# Patient Record
Sex: Male | Born: 1955 | Race: White | Hispanic: No | Smoking: Current every day smoker
Health system: Southern US, Community
[De-identification: ages and names within clinical notes are randomized; demographics above are authoritative.]

## PROBLEM LIST (undated history)

## (undated) DIAGNOSIS — I255 Ischemic cardiomyopathy: Secondary | ICD-10-CM

## (undated) DIAGNOSIS — D72829 Elevated white blood cell count, unspecified: Secondary | ICD-10-CM

## (undated) DIAGNOSIS — M1990 Unspecified osteoarthritis, unspecified site: Secondary | ICD-10-CM

## (undated) DIAGNOSIS — I5022 Chronic systolic (congestive) heart failure: Secondary | ICD-10-CM

## (undated) DIAGNOSIS — G8929 Other chronic pain: Secondary | ICD-10-CM

## (undated) DIAGNOSIS — E785 Hyperlipidemia, unspecified: Secondary | ICD-10-CM

## (undated) DIAGNOSIS — I779 Disorder of arteries and arterioles, unspecified: Secondary | ICD-10-CM

## (undated) DIAGNOSIS — I219 Acute myocardial infarction, unspecified: Secondary | ICD-10-CM

## (undated) DIAGNOSIS — M79671 Pain in right foot: Secondary | ICD-10-CM

## (undated) DIAGNOSIS — I2510 Atherosclerotic heart disease of native coronary artery without angina pectoris: Secondary | ICD-10-CM

## (undated) DIAGNOSIS — J449 Chronic obstructive pulmonary disease, unspecified: Secondary | ICD-10-CM

## (undated) DIAGNOSIS — R55 Syncope and collapse: Secondary | ICD-10-CM

## (undated) DIAGNOSIS — I10 Essential (primary) hypertension: Secondary | ICD-10-CM

## (undated) DIAGNOSIS — J439 Emphysema, unspecified: Secondary | ICD-10-CM

## (undated) DIAGNOSIS — Z87442 Personal history of urinary calculi: Secondary | ICD-10-CM

## (undated) DIAGNOSIS — G4733 Obstructive sleep apnea (adult) (pediatric): Secondary | ICD-10-CM

## (undated) DIAGNOSIS — R054 Cough syncope: Secondary | ICD-10-CM

## (undated) DIAGNOSIS — Z8489 Family history of other specified conditions: Secondary | ICD-10-CM

## (undated) DIAGNOSIS — N200 Calculus of kidney: Secondary | ICD-10-CM

## (undated) DIAGNOSIS — Z9989 Dependence on other enabling machines and devices: Secondary | ICD-10-CM

## (undated) HISTORY — DX: Obstructive sleep apnea (adult) (pediatric): G47.33

## (undated) HISTORY — PX: BACK SURGERY: SHX140

## (undated) HISTORY — DX: Hyperlipidemia, unspecified: E78.5

## (undated) HISTORY — PX: LUMBAR LAMINECTOMY: SHX95

## (undated) HISTORY — DX: Disorder of arteries and arterioles, unspecified: I77.9

## (undated) HISTORY — DX: Elevated white blood cell count, unspecified: D72.829

## (undated) HISTORY — DX: Chronic systolic (congestive) heart failure: I50.22

## (undated) HISTORY — DX: Dependence on other enabling machines and devices: Z99.89

## (undated) HISTORY — DX: Ischemic cardiomyopathy: I25.5

## (undated) HISTORY — DX: Unspecified osteoarthritis, unspecified site: M19.90

## (undated) HISTORY — DX: Emphysema, unspecified: J43.9

## (undated) HISTORY — DX: Calculus of kidney: N20.0

## (undated) HISTORY — PX: LITHOTRIPSY: SUR834

## (undated) HISTORY — PX: CERVICAL FUSION: SHX112

## (undated) HISTORY — DX: Chronic obstructive pulmonary disease, unspecified: J44.9

## (undated) HISTORY — DX: Atherosclerotic heart disease of native coronary artery without angina pectoris: I25.10

## (undated) HISTORY — DX: Acute myocardial infarction, unspecified: I21.9

## (undated) HISTORY — PX: COLON SURGERY: SHX602

## (undated) HISTORY — DX: Pain in right foot: M79.671

## (undated) HISTORY — DX: Other chronic pain: G89.29

---
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
## 1988-01-05 HISTORY — PX: PARTIAL COLECTOMY: SHX5273

## 1999-01-04 ENCOUNTER — Emergency Department (HOSPITAL_COMMUNITY): Admission: EM | Admit: 1999-01-04 | Discharge: 1999-01-04 | Payer: Self-pay | Admitting: *Deleted

## 1999-12-11 ENCOUNTER — Emergency Department (HOSPITAL_COMMUNITY): Admission: EM | Admit: 1999-12-11 | Discharge: 1999-12-11 | Payer: Self-pay | Admitting: Internal Medicine

## 1999-12-17 ENCOUNTER — Emergency Department (HOSPITAL_COMMUNITY): Admission: EM | Admit: 1999-12-17 | Discharge: 1999-12-17 | Payer: Self-pay | Admitting: Emergency Medicine

## 2000-02-01 ENCOUNTER — Ambulatory Visit (HOSPITAL_COMMUNITY): Admission: RE | Admit: 2000-02-01 | Discharge: 2000-02-01 | Payer: Self-pay | Admitting: Neurosurgery

## 2000-02-01 ENCOUNTER — Encounter: Payer: Self-pay | Admitting: Neurosurgery

## 2000-03-14 ENCOUNTER — Ambulatory Visit (HOSPITAL_COMMUNITY): Admission: RE | Admit: 2000-03-14 | Discharge: 2000-03-14 | Payer: Self-pay | Admitting: Neurosurgery

## 2000-03-14 ENCOUNTER — Encounter: Payer: Self-pay | Admitting: Neurosurgery

## 2002-09-17 ENCOUNTER — Encounter: Payer: Self-pay | Admitting: Emergency Medicine

## 2002-09-17 ENCOUNTER — Emergency Department (HOSPITAL_COMMUNITY): Admission: EM | Admit: 2002-09-17 | Discharge: 2002-09-17 | Payer: Self-pay | Admitting: Emergency Medicine

## 2013-01-04 DIAGNOSIS — G8929 Other chronic pain: Secondary | ICD-10-CM

## 2013-01-04 DIAGNOSIS — M79671 Pain in right foot: Secondary | ICD-10-CM

## 2013-01-04 DIAGNOSIS — I219 Acute myocardial infarction, unspecified: Secondary | ICD-10-CM | POA: Insufficient documentation

## 2013-01-04 HISTORY — PX: CARDIAC CATHETERIZATION: SHX172

## 2013-01-04 HISTORY — PX: LIVER SURGERY: SHX698

## 2013-01-04 HISTORY — DX: Acute myocardial infarction, unspecified: I21.9

## 2013-01-04 HISTORY — DX: Other chronic pain: G89.29

## 2016-10-10 ENCOUNTER — Emergency Department: Payer: Medicaid Other

## 2016-10-10 ENCOUNTER — Emergency Department
Admission: EM | Admit: 2016-10-10 | Discharge: 2016-10-10 | Disposition: A | Discharge status: Home / Self Care | Payer: Medicaid Other | Attending: Emergency Medicine | Admitting: Emergency Medicine

## 2016-10-10 ENCOUNTER — Encounter: Payer: Self-pay | Admitting: Emergency Medicine

## 2016-10-10 DIAGNOSIS — Z79899 Other long term (current) drug therapy: Secondary | ICD-10-CM | POA: Insufficient documentation

## 2016-10-10 DIAGNOSIS — R0789 Other chest pain: Secondary | ICD-10-CM

## 2016-10-10 DIAGNOSIS — Z7982 Long term (current) use of aspirin: Secondary | ICD-10-CM | POA: Insufficient documentation

## 2016-10-10 DIAGNOSIS — I10 Essential (primary) hypertension: Secondary | ICD-10-CM | POA: Insufficient documentation

## 2016-10-10 DIAGNOSIS — F17210 Nicotine dependence, cigarettes, uncomplicated: Secondary | ICD-10-CM | POA: Diagnosis not present

## 2016-10-10 HISTORY — DX: Essential (primary) hypertension: I10

## 2016-10-10 LAB — CBC
HCT: 47.3 % (ref 40.0–52.0)
Hemoglobin: 16 g/dL (ref 13.0–18.0)
MCH: 29.6 pg (ref 26.0–34.0)
MCHC: 33.8 g/dL (ref 32.0–36.0)
MCV: 87.7 fL (ref 80.0–100.0)
Platelets: 257 10*3/uL (ref 150–440)
RBC: 5.39 MIL/uL (ref 4.40–5.90)
RDW: 13.5 % (ref 11.5–14.5)
WBC: 13.1 10*3/uL — ABNORMAL HIGH (ref 3.8–10.6)

## 2016-10-10 LAB — BASIC METABOLIC PANEL
Anion gap: 9 (ref 5–15)
BUN: 19 mg/dL (ref 6–20)
CO2: 27 mmol/L (ref 22–32)
Calcium: 9.4 mg/dL (ref 8.9–10.3)
Chloride: 103 mmol/L (ref 101–111)
Creatinine, Ser: 1.15 mg/dL (ref 0.61–1.24)
GFR calc Af Amer: >60 mL/min (ref >60)
GFR calc non Af Amer: >60 mL/min (ref >60)
Glucose, Bld: 129 mg/dL — ABNORMAL HIGH (ref 65–99)
Potassium: 3.6 mmol/L (ref 3.5–5.1)
Sodium: 139 mmol/L (ref 135–145)

## 2016-10-10 LAB — TROPONIN I
Troponin I: <0.03 ng/mL (ref <0.03)
Troponin I: <0.03 ng/mL (ref <0.03)

## 2016-10-10 MED ORDER — ASPIRIN 81 MG PO CHEW
324.0000 mg | CHEWABLE_TABLET | Freq: Once | ORAL | Status: AC
  Administered 2016-10-10: 324 mg via ORAL
  Filled 2016-10-10: qty 4

## 2016-10-10 NOTE — ED Triage Notes (Signed)
Pt comes into the ED via POV c/o chest pain on the left side with no radiation.  Patient has h/o MI and cardiac catheterizations in the past.  States this pain does not feel the same but that it scares him due to it being intermittent and sharp.  Patient ambulatory to triage and has even and unlabored respirations at this time.  Denies taking any nitro at home but took his 81 mg aspirin.

## 2016-10-10 NOTE — ED Notes (Signed)
Pt states cp weds and then again today. Hx of 2 stents. Moved here one month ago from [place] and does not have a cardiologist or a pcp. Asa given, pt on monitor. Redraw of troponin due at 2015.

## 2016-10-10 NOTE — ED Provider Notes (Signed)
[facility] Emergency Department Provider Note  ____________________________________________  Time seen: Approximately 9:32 PM  I have reviewed the triage vital signs and the nursing notes.   HISTORY  Chief Complaint Chest Pain    HPI Caleb [name] is a 61 y.o. male who complains of intermittent chest pain for the past 4 days. Not exertional, not pleuritic. In the left chest just at the areola of the left pectoralis. Tender to the touch in that area. Sharp, comes and goes lasting 2 seconds at a time. No diaphoresis vomiting or radiation. No shortness of breath. Mild to moderate intensity.     Past Medical History:  Diagnosis Date  . Hypertension   . MI (mitral incompetence)      There are no active problems to display for this patient.    Past Surgical History:  Procedure Laterality Date  . CARDIAC CATHETERIZATION    . CERVICAL FUSION    . COLECTOMY    . HERNIA REPAIR    . LIVER SURGERY    . LUMBAR LAMINECTOMY       Prior to Admission medications   Medication Sig Start Date End Date Taking? Authorizing Provider  albuterol (PROVENTIL HFA;VENTOLIN HFA) 108 (90 Base) MCG/ACT inhaler Inhale 1-2 puffs into the lungs every 6 (six) hours as needed for wheezing or shortness of breath.   Yes [provider]  aspirin EC 81 MG tablet Take 81 mg by mouth daily.   Yes [provider]  cyclobenzaprine (FLEXERIL) 10 MG tablet Take 10 mg by mouth daily.   Yes [provider]  gabapentin (NEURONTIN) 300 MG capsule Take 600 mg by mouth 4 (four) times daily.   Yes [provider]  hydrochlorothiazide (HYDRODIURIL) 25 MG tablet Take 25 mg by mouth daily.   Yes [provider]  isosorbide mononitrate (IMDUR) 30 MG 24 hr tablet Take 15 mg by mouth daily.   Yes [provider]  metoprolol tartrate (LOPRESSOR) 25 MG tablet Take 25 mg by mouth 2 (two) times daily.   Yes [provider]  rosuvastatin  (CRESTOR) 40 MG tablet Take 40 mg by mouth daily.   Yes [provider]  tiotropium (SPIRIVA) 18 MCG inhalation capsule Place 18 mcg into inhaler and inhale daily.   Yes [provider]  fluticasone (FLOVENT HFA) 220 MCG/ACT inhaler Inhale 1 puff into the lungs 2 (two) times daily.    [provider]     Allergies Patient has no known allergies.   No family history on file.  Social History Social History  Substance Use Topics  . Smoking status: Current Every Day Smoker    Packs/day: 1.00    Types: Cigarettes  . Smokeless tobacco: Never Used  . Alcohol use Yes    Review of Systems  Constitutional:   No fever or chills.  ENT:   No sore throat. No rhinorrhea. Cardiovascular:   positive as above for chest pain syncope. Respiratory:   No dyspnea or cough. Gastrointestinal:   Negative for abdominal pain, vomiting and diarrhea.  Musculoskeletal:   Negative for focal pain or swelling All other systems reviewed and are negative except as documented above in ROS and HPI.  ____________________________________________   PHYSICAL EXAM:  VITAL SIGNS: ED Triage Vitals  Enc Vitals Group     BP 10/10/16 1847 (!) 169/92     Pulse Rate 10/10/16 1900 82     Resp 10/10/16 1900 19     Temp --  Temp src --      SpO2 10/10/16 1900 98 %     Weight 10/10/16 1717 195 lb (88.5 kg)     Height 10/10/16 1717 5\' 8"  (1.727 m)     Head Circumference --      Peak Flow --      Pain Score 10/10/16 1716 7     Pain Loc --      Pain Edu? --      Excl. in [place]? --     Vital signs reviewed, nursing assessments reviewed.   Constitutional:   Alert and oriented. Well appearing and in no distress. Eyes:   No scleral icterus.  EOMI. No nystagmus. No conjunctival pallor. PERRL. ENT   Head:   Normocephalic and atraumatic.   Nose:   No congestion/rhinnorhea.    Mouth/Throat:   MMM, no pharyngeal erythema. No peritonsillar mass.    Neck:   No meningismus. Full  ROM. Hematological/Lymphatic/Immunilogical:   No cervical lymphadenopathy. Cardiovascular:   RRR. Symmetric bilateral radial and DP pulses.  No murmurs.  Respiratory:   Normal respiratory effort without tachypnea/retractions. Breath sounds are clear and equal bilaterally. No wheezes/rales/rhonchi. Gastrointestinal:   Soft with mild left upper quadrant tenderness. Non distended. There is no CVA tenderness.  No rebound, rigidity, or guarding. Genitourinary:   deferred Musculoskeletal:   Normal range of motion in all extremities. No joint effusions.  No lower extremity tenderness.  No edema.left chest wall tender at the areola without inflammatory changes or discharge. No gynecomastia Neurologic:   Normal speech and language.  Motor grossly intact. No gross focal neurologic deficits are appreciated.  Skin:    Skin is warm, dry and intact. No rash noted.  No petechiae, purpura, or bullae.  ____________________________________________    LABS (pertinent positives/negatives) (all labs ordered are listed, but only abnormal results are displayed) Labs Reviewed  BASIC METABOLIC PANEL - Abnormal; Notable for the following:       Result Value   Glucose, Bld 129 (*)    All other components within normal limits  CBC - Abnormal; Notable for the following:    WBC 13.1 (*)    All other components within normal limits  TROPONIN I  TROPONIN I   ____________________________________________   EKG  interpreted by me Sinus rhythm rate of 93, left axis, normal intervals. Normal QRS ST segments and T waves. 2 PVCs on the strip.  ____________________________________________    RADIOLOGY  Dg Chest 2 View  Result Date: 10/10/2016 CLINICAL DATA:  Chest pain since this morning. EXAM: CHEST  2 VIEW COMPARISON:  CXR report 09/17/2002 FINDINGS: The heart size and mediastinal contours are within normal limits. No pulmonary consolidation, effusion or pneumothorax. Mild central vascular congestion.  Degenerative changes are seen along the dorsal spine. IMPRESSION: 1. Mild central vascular prominence/congestion. 2. Mild thoracic spondylosis. Electronically Signed   By: Ashley Royalty M.D.   On: 10/10/2016 17:38    ____________________________________________   PROCEDURES Procedures  ____________________________________________   INITIAL IMPRESSION / ASSESSMENT AND PLAN / ED COURSE  Pertinent labs & imaging results that were available during my care of the patient were reviewed by me and considered in my medical decision making (see chart for details).  As part of my medical decision making, I reviewed the following data within the [place] lab results, radiology reports and I personally reviewed the following imaging studies: radiograph(s).   patient presents with atypical chest pain. Possible pneumonia or pneumothorax. Symptoms may be related to gastritis  and GERD. .Considering the patient's symptoms, medical history, and physical examination today, I have low suspicion for ACS, PE, TAD, carditis, mediastinitis, CHF, or sepsis.We'll get a chest x-ray and delta troponin to evaluate given his underlying comorbidities.    ----------------------------------------- 9:33 PM on 10/10/2016 -----------------------------------------  workup negative. Repeat troponin negative. Patient calm comfortable, symptoms minimal, suitable for outpatient follow-up.        ____________________________________________   FINAL CLINICAL IMPRESSION(S) / ED DIAGNOSES  Final diagnoses:  Atypical chest pain      New Prescriptions   No medications on file     Portions of this note were generated with dragon dictation software. Dictation errors may occur despite best attempts at proofreading.    Carrie Mew, MD 10/10/16 2135

## 2016-10-10 NOTE — ED Notes (Signed)
Pt walked out stating he was tired of waiting. Dr made aware.

## 2016-10-21 ENCOUNTER — Ambulatory Visit (INDEPENDENT_AMBULATORY_CARE_PROVIDER_SITE_OTHER): Payer: Medicaid Other | Admitting: Family Medicine

## 2016-10-21 ENCOUNTER — Ambulatory Visit
Admission: RE | Admit: 2016-10-21 | Discharge: 2016-10-21 | Disposition: A | Discharge status: Home / Self Care | Payer: Medicaid Other | Source: Ambulatory Visit | Attending: Family Medicine | Admitting: Family Medicine

## 2016-10-21 ENCOUNTER — Encounter: Payer: Self-pay | Admitting: Family Medicine

## 2016-10-21 VITALS — BP 144/80 | HR 82 | Temp 98.0°F | Resp 20 | Ht 68.0 in | Wt 200.0 lb

## 2016-10-21 DIAGNOSIS — M79671 Pain in right foot: Secondary | ICD-10-CM

## 2016-10-21 DIAGNOSIS — E785 Hyperlipidemia, unspecified: Secondary | ICD-10-CM | POA: Insufficient documentation

## 2016-10-21 DIAGNOSIS — J449 Chronic obstructive pulmonary disease, unspecified: Secondary | ICD-10-CM

## 2016-10-21 DIAGNOSIS — R079 Chest pain, unspecified: Secondary | ICD-10-CM | POA: Diagnosis not present

## 2016-10-21 DIAGNOSIS — R109 Unspecified abdominal pain: Secondary | ICD-10-CM | POA: Diagnosis not present

## 2016-10-21 DIAGNOSIS — Z7689 Persons encountering health services in other specified circumstances: Secondary | ICD-10-CM

## 2016-10-21 DIAGNOSIS — G4733 Obstructive sleep apnea (adult) (pediatric): Secondary | ICD-10-CM | POA: Diagnosis not present

## 2016-10-21 DIAGNOSIS — Z9989 Dependence on other enabling machines and devices: Secondary | ICD-10-CM

## 2016-10-21 DIAGNOSIS — I219 Acute myocardial infarction, unspecified: Secondary | ICD-10-CM

## 2016-10-21 DIAGNOSIS — I10 Essential (primary) hypertension: Secondary | ICD-10-CM | POA: Insufficient documentation

## 2016-10-21 DIAGNOSIS — R935 Abnormal findings on diagnostic imaging of other abdominal regions, including retroperitoneum: Secondary | ICD-10-CM | POA: Insufficient documentation

## 2016-10-21 DIAGNOSIS — G8929 Other chronic pain: Secondary | ICD-10-CM

## 2016-10-21 LAB — POCT URINALYSIS DIPSTICK
Bilirubin, UA: NEGATIVE
Glucose, UA: NEGATIVE
Ketones, UA: NEGATIVE
Leukocytes, UA: NEGATIVE
Nitrite, UA: NEGATIVE
Spec Grav, UA: 1.015 (ref 1.010–1.025)
Urobilinogen, UA: 0.2 E.U./dL
pH, UA: 7 (ref 5.0–8.0)

## 2016-10-21 LAB — CBC WITH DIFFERENTIAL/PLATELET
Basophils Absolute: 36 cells/uL (ref 0–200)
Basophils Relative: 0.3 %
Eosinophils Absolute: 190 cells/uL (ref 15–500)
Eosinophils Relative: 1.6 %
HCT: 47.5 % (ref 38.5–50.0)
Hemoglobin: 15.9 g/dL (ref 13.2–17.1)
Lymphs Abs: 3296 cells/uL (ref 850–3900)
MCH: 29.1 pg (ref 27.0–33.0)
MCHC: 33.5 g/dL (ref 32.0–36.0)
MCV: 87 fL (ref 80.0–100.0)
MPV: 9.1 fL (ref 7.5–12.5)
Monocytes Relative: 8.4 %
Neutro Abs: 7378 cells/uL (ref 1500–7800)
Neutrophils Relative %: 62 %
Platelets: 263 10*3/uL (ref 140–400)
RBC: 5.46 10*6/uL (ref 4.20–5.80)
RDW: 12.4 % (ref 11.0–15.0)
Total Lymphocyte: 27.7 %
WBC mixed population: 1000 cells/uL — ABNORMAL HIGH (ref 200–950)
WBC: 11.9 10*3/uL — ABNORMAL HIGH (ref 3.8–10.8)

## 2016-10-21 LAB — COMPLETE METABOLIC PANEL WITH GFR
AG Ratio: 1.5 (calc) (ref 1.0–2.5)
ALT: 13 U/L (ref 9–46)
AST: 14 U/L (ref 10–35)
Albumin: 4.1 g/dL (ref 3.6–5.1)
Alkaline phosphatase (APISO): 75 U/L (ref 40–115)
BUN: 20 mg/dL (ref 7–25)
CO2: 31 mmol/L (ref 20–32)
Calcium: 9.4 mg/dL (ref 8.6–10.3)
Chloride: 98 mmol/L (ref 98–110)
Creat: 0.98 mg/dL (ref 0.70–1.33)
GFR, Est African American: 98 mL/min/{1.73_m2} (ref >=60)
GFR, Est Non African American: 85 mL/min/{1.73_m2} (ref >=60)
Globulin: 2.8 g/dL (calc) (ref 1.9–3.7)
Glucose, Bld: 116 mg/dL — ABNORMAL HIGH (ref 65–99)
Potassium: 3.4 mmol/L — ABNORMAL LOW (ref 3.5–5.3)
Sodium: 137 mmol/L (ref 135–146)
Total Bilirubin: 0.3 mg/dL (ref 0.2–1.2)
Total Protein: 6.9 g/dL (ref 6.1–8.1)

## 2016-10-21 LAB — LIPID PANEL
Cholesterol: 140 mg/dL (ref <200)
HDL: 39 mg/dL — ABNORMAL LOW (ref >40)
LDL Cholesterol (Calc): 68 mg/dL (calc)
Non-HDL Cholesterol (Calc): 101 mg/dL (calc) (ref <130)
Total CHOL/HDL Ratio: 3.6 (calc) (ref <5.0)
Triglycerides: 248 mg/dL — ABNORMAL HIGH (ref <150)

## 2016-10-21 MED ORDER — HYDROCHLOROTHIAZIDE 25 MG PO TABS: 25.0000 mg | ORAL_TABLET | Freq: Every day | ORAL | 3 refills | Status: DC

## 2016-10-21 MED ORDER — ROSUVASTATIN CALCIUM 40 MG PO TABS: 40.0000 mg | ORAL_TABLET | Freq: Every day | ORAL | 3 refills | Status: DC

## 2016-10-21 MED ORDER — CYCLOBENZAPRINE HCL 10 MG PO TABS: 10.0000 mg | ORAL_TABLET | Freq: Every day | ORAL | 1 refills | Status: DC

## 2016-10-21 MED ORDER — GABAPENTIN 300 MG PO CAPS: 600.0000 mg | ORAL_CAPSULE | Freq: Four times a day (QID) | ORAL | 2 refills | Status: DC

## 2016-10-21 NOTE — Assessment & Plan Note (Signed)
History and exam consistent with GERD Given reproducibility on exam, improvement with Zantac, worsening after eating a burger, recent negative troponins and chest x-ray, unchanged EKG and being nonexertional in quality, very low suspicion for cardiac etiology Trial of Zantac twice a day Return precautions discussed

## 2016-10-21 NOTE — Assessment & Plan Note (Signed)
Uncontrolled currently Patient states this is due to being nervous and having pain today Continue HCTZ Check CMP today Follow-up in one month

## 2016-10-21 NOTE — Assessment & Plan Note (Signed)
Continue gabapentin and Flexeril as needed
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
## 2016-10-21 NOTE — Assessment & Plan Note (Signed)
Recheck lipid panel Continue Crestor

## 2016-10-21 NOTE — Assessment & Plan Note (Signed)
Patient is not currently in distress and sitting comfortably He has no findings on exam suggestive of nephrolithiasis His urinalysis is clear Suggested hydration, Zantac for GERD as above Get abdominal 2 view Return precautions discussed If he is symptomatic and it is suggestive of nephrolithiasis, consider CT abdomen and pelvis renal study given that he has needed lithotripsy times in the past
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
## 2016-10-21 NOTE — Assessment & Plan Note (Signed)
Currently stable Continues for spiriva, ICS, albuterol as needed

## 2016-10-21 NOTE — Assessment & Plan Note (Signed)
Continue medical management with beta blocker, statin, Imdur, aspirin Referral to cardiology for follow-up

## 2016-10-21 NOTE — Patient Instructions (Signed)
Heartburn Heartburn is a type of pain or discomfort that can happen in the throat or chest. It is often described as a burning pain. It may also cause a bad taste in the mouth. Heartburn may feel worse when you lie down or bend over, and it is often worse at night. Heartburn may be caused by stomach contents that move back up into the esophagus (reflux). Follow these instructions at home: Take these actions to decrease your discomfort and to help avoid complications. Diet  Follow a diet as recommended by your health care provider. This may involve avoiding foods and drinks such as: ? Coffee and tea (with or without caffeine). ? Drinks that contain alcohol. ? Energy drinks and sports drinks. ? Carbonated drinks or sodas. ? Chocolate and cocoa. ? Peppermint and mint flavorings. ? Garlic and onions. ? Horseradish. ? Spicy and acidic foods, including peppers, chili powder, curry powder, vinegar, hot sauces, and barbecue sauce. ? Citrus fruit juices and citrus fruits, such as oranges, lemons, and limes. ? Tomato-based foods, such as red sauce, chili, salsa, and pizza with red sauce. ? Fried and fatty foods, such as donuts, french fries, potato chips, and high-fat dressings. ? High-fat meats, such as hot dogs and fatty cuts of red and white meats, such as rib eye steak, sausage, ham, and bacon. ? High-fat dairy items, such as whole milk, butter, and cream cheese.  Eat small, frequent meals instead of large meals.  Avoid drinking large amounts of liquid with your meals.  Avoid eating meals during the 2-3 hours before bedtime.  Avoid lying down right after you eat.  Do not exercise right after you eat. General instructions  Pay attention to any changes in your symptoms.  Take over-the-counter and prescription medicines only as told by your health care provider. Do not take aspirin, ibuprofen, or other NSAIDs unless your health care provider told you to do so.  Do not use any tobacco  products, including cigarettes, chewing tobacco, and e-cigarettes. If you need help quitting, ask your health care provider.  Wear loose-fitting clothing. Do not wear anything tight around your waist that causes pressure on your abdomen.  Raise (elevate) the head of your bed about 6 inches (15 cm).  Try to reduce your stress, such as with yoga or meditation. If you need help reducing stress, ask your health care provider.  If you are overweight, reduce your weight to an amount that is healthy for you. Ask your health care provider for guidance about a safe weight loss goal.  Keep all follow-up visits as told by your health care provider. This is important. Contact a health care provider if:  You have new symptoms.  You have unexplained weight loss.  You have difficulty swallowing, or it hurts to swallow.  You have wheezing or a persistent cough.  Your symptoms do not improve with treatment.  You have frequent heartburn for more than two weeks. Get help right away if:  You have pain in your arms, neck, jaw, teeth, or back.  You feel sweaty, dizzy, or light-headed.  You have chest pain or shortness of breath.  You vomit and your vomit looks like blood or coffee grounds.  Your stool is bloody or black. This information is not intended to replace advice given to you by your health care provider. Make sure you discuss any questions you have with your health care provider. Document Released: 05/09/2008 Document Revised: 05/29/2015 Document Reviewed: 04/17/2014 Elsevier Interactive Patient Education  2017 Elsevier   Inc.  

## 2016-10-25 ENCOUNTER — Encounter: Payer: Self-pay | Admitting: Family Medicine

## 2016-10-25 ENCOUNTER — Ambulatory Visit (INDEPENDENT_AMBULATORY_CARE_PROVIDER_SITE_OTHER): Payer: Medicaid Other | Admitting: Family Medicine

## 2016-10-25 ENCOUNTER — Ambulatory Visit
Admission: RE | Admit: 2016-10-25 | Discharge: 2016-10-25 | Disposition: A | Discharge status: Home / Self Care | Payer: Medicaid Other | Source: Ambulatory Visit | Attending: Family Medicine | Admitting: Family Medicine

## 2016-10-25 VITALS — BP 134/80 | HR 83 | Temp 98.6°F | Resp 17 | Wt 201.6 lb

## 2016-10-25 DIAGNOSIS — K760 Fatty (change of) liver, not elsewhere classified: Secondary | ICD-10-CM | POA: Diagnosis not present

## 2016-10-25 DIAGNOSIS — R1031 Right lower quadrant pain: Secondary | ICD-10-CM | POA: Insufficient documentation

## 2016-10-25 DIAGNOSIS — I700 Atherosclerosis of aorta: Secondary | ICD-10-CM | POA: Diagnosis not present

## 2016-10-25 DIAGNOSIS — I2510 Atherosclerotic heart disease of native coronary artery without angina pectoris: Secondary | ICD-10-CM | POA: Diagnosis not present

## 2016-10-25 DIAGNOSIS — N200 Calculus of kidney: Secondary | ICD-10-CM | POA: Diagnosis not present

## 2016-10-25 DIAGNOSIS — Z9889 Other specified postprocedural states: Secondary | ICD-10-CM | POA: Insufficient documentation

## 2016-10-25 MED ORDER — IOPAMIDOL (ISOVUE-300) INJECTION 61%
100.0000 mL | Freq: Once | INTRAVENOUS | Status: AC | PRN
  Administered 2016-10-25: 100 mL via INTRAVENOUS

## 2016-10-25 MED ORDER — HYDROCODONE-ACETAMINOPHEN 5-325 MG PO TABS: ORAL_TABLET | ORAL | 0 refills | Status: DC

## 2016-10-25 NOTE — Patient Instructions (Signed)
We will call you with the scan time and results. Report to the ER if pain worsening.

## 2016-10-25 NOTE — Progress Notes (Signed)
bSubjective:     Patient ID: Caleb [name], male   DOB: 1955-09-21, 61 y.o.   MRN: [id number]  HPI/ Chief Complaint  Patient presents with  . Flank Pain    Patient returns fo follow up visit after being seen and treatin on 10/21/16, paitent reports that he has continue on Zantac and Magesium but has notice no improvements. Patient describes pain as sharp, he denies nausea,vomiting or fever. Patient x-ray of abdomen showed 31mm calcification projected over the lower pole left kidney, patient reports today pain is on lower right side.   Reports he took an oral laxative and moved his bowels well but  has felt nauseous without vomiting.Reports persistent pain in right lower abdomen radiation to the back. No fever or chills reported. Abdomen x-ray from prior visit with left kidney stone but other views impaired by gas and stool. Hx of sigmoid colectomy due to severe diverticulitis.   Review of Systems     Objective:   Physical Exam  Constitutional: He appears well-developed and well-nourished. He appears distressed (mild discomfort from pain).  Abdominal: Bowel sounds are normal. There is tenderness (rmoderate in right LQ and mild on left side quadrants).       Assessment:    1. Right lower quadrant abdominal pain: r/o appendicitis - CT Abdomen Pelvis W Contrast; Future - HYDROcodone-acetaminophen (NORCO/VICODIN) 5-325 MG tablet; One every 4-6 hours as needed for pain  Dispense: 20 tablet; Refill: 0    Plan:    Report to the ER if increased pain or worsening sx.

## 2016-11-18 ENCOUNTER — Ambulatory Visit: Payer: Medicaid Other | Admitting: Family Medicine

## 2016-11-18 ENCOUNTER — Encounter: Payer: Self-pay | Admitting: Family Medicine

## 2016-11-18 VITALS — BP 132/80 | HR 88 | Temp 98.3°F | Resp 16 | Wt 200.0 lb

## 2016-11-18 DIAGNOSIS — M79671 Pain in right foot: Secondary | ICD-10-CM

## 2016-11-18 DIAGNOSIS — I10 Essential (primary) hypertension: Secondary | ICD-10-CM | POA: Diagnosis not present

## 2016-11-18 DIAGNOSIS — J449 Chronic obstructive pulmonary disease, unspecified: Secondary | ICD-10-CM | POA: Diagnosis not present

## 2016-11-18 DIAGNOSIS — Z720 Tobacco use: Secondary | ICD-10-CM

## 2016-11-18 DIAGNOSIS — E7800 Pure hypercholesterolemia, unspecified: Secondary | ICD-10-CM

## 2016-11-18 DIAGNOSIS — G8929 Other chronic pain: Secondary | ICD-10-CM

## 2016-11-18 DIAGNOSIS — L404 Guttate psoriasis: Secondary | ICD-10-CM | POA: Insufficient documentation

## 2016-11-18 MED ORDER — ALBUTEROL SULFATE HFA 108 (90 BASE) MCG/ACT IN AERS: 1.0000 | INHALATION_SPRAY | Freq: Four times a day (QID) | RESPIRATORY_TRACT | 5 refills | Status: DC | PRN

## 2016-11-18 MED ORDER — FLUTICASONE PROPIONATE HFA 220 MCG/ACT IN AERO: 1.0000 | INHALATION_SPRAY | Freq: Two times a day (BID) | RESPIRATORY_TRACT | 5 refills | Status: DC

## 2016-11-18 MED ORDER — BUPROPION HCL ER (XL) 150 MG PO TB24: 150.0000 mg | ORAL_TABLET | Freq: Every day | ORAL | 1 refills | Status: DC

## 2016-11-18 MED ORDER — TIOTROPIUM BROMIDE MONOHYDRATE 18 MCG IN CAPS: 18.0000 ug | ORAL_CAPSULE | Freq: Every day | RESPIRATORY_TRACT | 5 refills | Status: DC

## 2016-11-18 MED ORDER — TRAMADOL HCL 50 MG PO TABS: 50.0000 mg | ORAL_TABLET | Freq: Two times a day (BID) | ORAL | 2 refills | Status: DC | PRN

## 2016-11-18 MED ORDER — TRIAMCINOLONE ACETONIDE 0.5 % EX OINT: 1.0000 "application " | TOPICAL_OINTMENT | Freq: Two times a day (BID) | CUTANEOUS | 2 refills | Status: DC

## 2016-11-18 NOTE — Assessment & Plan Note (Signed)
Continue gabapentin Tramadol prn for severe pain Discussed that no early refills given, will need to be seen for OV to get refills

## 2016-11-18 NOTE — Assessment & Plan Note (Signed)
5 minute discussion regarding harms of smoking and benefits of quitting Discussed options to help quit smoking Start wellbutrin 150mg  daily  Plan for quit date on day 7 of therapy Discussed barriers to quitting F/u in 1 month
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
## 2016-11-18 NOTE — Assessment & Plan Note (Signed)
Well controlled. Continue Crestor. 

## 2016-11-18 NOTE — Assessment & Plan Note (Signed)
Well controlled Continue current meds Recent CMP wnl

## 2016-11-18 NOTE — Assessment & Plan Note (Signed)
Poorly controlled No signs of exacerbation, however Resume spiriva, ICS, albuterol prn

## 2016-11-18 NOTE — Assessment & Plan Note (Signed)
Appears to be psoriatic Discussed moisturizing Triamcinolone ointment BID Signs of infection discussed Could consider dermatology referral in the future

## 2016-11-18 NOTE — Patient Instructions (Addendum)
1-800-quitline   Steps to Quit Smoking Smoking tobacco can be bad for your health. It can also affect almost every organ in your body. Smoking puts you and people around you at risk for many serious long-lasting (chronic) diseases. Quitting smoking is hard, but it is one of the best things that you can do for your health. It is never too late to quit. What are the benefits of quitting smoking? When you quit smoking, you lower your risk for getting serious diseases and conditions. They can include:  Lung cancer or lung disease.  Heart disease.  Stroke.  Heart attack.  Not being able to have children (infertility).  Weak bones (osteoporosis) and broken bones (fractures).  If you have coughing, wheezing, and shortness of breath, those symptoms may get better when you quit. You may also get sick less often. If you are pregnant, quitting smoking can help to lower your chances of having a baby of low birth weight. What can I do to help me quit smoking? Talk with your doctor about what can help you quit smoking. Some things you can do (strategies) include:  Quitting smoking totally, instead of slowly cutting back how much you smoke over a period of time.  Going to in-person counseling. You are more likely to quit if you go to many counseling sessions.  Using resources and support systems, such as: ? Database administrator with a Social worker. ? Phone quitlines. ? Careers information officer. ? Support groups or group counseling. ? Text messaging programs. ? Mobile phone apps or applications.  Taking medicines. Some of these medicines may have nicotine in them. If you are pregnant or breastfeeding, do not take any medicines to quit smoking unless your doctor says it is okay. Talk with your doctor about counseling or other things that can help you.  Talk with your doctor about using more than one strategy at the same time, such as taking medicines while you are also going to in-person counseling.  This can help make quitting easier. What things can I do to make it easier to quit? Quitting smoking might feel very hard at first, but there is a lot that you can do to make it easier. Take these steps:  Talk to your family and friends. Ask them to support and encourage you.  Call phone quitlines, reach out to support groups, or work with a Social worker.  Ask people who smoke to not smoke around you.  Avoid places that make you want (trigger) to smoke, such as: ? Bars. ? Parties. ? Smoke-break areas at work.  Spend time with people who do not smoke.  Lower the stress in your life. Stress can make you want to smoke. Try these things to help your stress: ? Getting regular exercise. ? Deep-breathing exercises. ? Yoga. ? Meditating. ? Doing a body scan. To do this, close your eyes, focus on one area of your body at a time from head to toe, and notice which parts of your body are tense. Try to relax the muscles in those areas.  Download or buy apps on your mobile phone or tablet that can help you stick to your quit plan. There are many free apps, such as QuitGuide from the [employer] Office manager for Disease Control and Prevention). You can find more support from smokefree.gov and other websites.  This information is not intended to replace advice given to you by your health care provider. Make sure you discuss any questions you have with your health care provider. Document  Released: 10/17/2008 Document Revised: 08/19/2015 Document Reviewed: 05/07/2014 Elsevier Interactive Patient Education  2018 [employer].    Psoriasis Psoriasis is a long-term (chronic) skin condition. It causes raised, red patches (plaques) on your skin that look silvery. The red patches may show up anywhere on your body. They can be any size or shape. Psoriasis can come and go. It can range from mild to very bad. It cannot be passed from one person to another (not contagious). There is no cure for this condition, but it can be  helped with treatment. Follow these instructions at home: Skin Care  Apply moisturizers to your skin as needed. Only use those that your doctor has said are okay.  Apply cool compresses to the affected areas.  Do not scratch your skin. Lifestyle   Do not use tobacco products. This includes cigarettes, chewing tobacco, and e-cigarettes. If you need help quitting, ask your doctor.  Drink little or no alcohol.  Try to lower your stress. Meditation or yoga may help.  Get sun as told by your doctor. Do not get sunburned.  Think about joining a psoriasis support group. Medicines  Take or use over-the-counter and prescription medicines only as told by your doctor.  If you were prescribed an antibiotic, take or use it as told by your doctor. Do not stop taking the antibiotic even if your condition starts to get better. General instructions  Keep a journal. Use this to help track what triggers an outbreak. Try to avoid any triggers.  See a counselor or social worker if you feel very sad, upset, or hopeless about your condition and these feelings affect your work or relationships.  Keep all follow-up visits as told by your doctor. This is important. Contact a doctor if:  Your pain gets worse.  You have more redness or warmth in the affected areas.  You have new pain or stiffness in your joints.  Your pain or stiffness in your joints gets worse.  Your nails start to break easily.  Your nails pull away from the nail bed easily.  You have a fever.  You feel very sad (depressed). This information is not intended to replace advice given to you by your health care provider. Make sure you discuss any questions you have with your health care provider. Document Released: 01/29/2004 Document Revised: 05/29/2015 Document Reviewed: 05/08/2014 Elsevier Interactive Patient Education  2018 [employer].

## 2016-11-27 ENCOUNTER — Other Ambulatory Visit: Payer: Self-pay | Admitting: Family Medicine

## 2016-11-29 NOTE — Telephone Encounter (Signed)
LOV 11/18/2016. HTN was controlled at that time, and pt was advised to continue current medications.

## 2016-12-17 ENCOUNTER — Encounter: Payer: Self-pay | Admitting: Family Medicine

## 2016-12-17 ENCOUNTER — Other Ambulatory Visit: Payer: Self-pay

## 2016-12-17 ENCOUNTER — Ambulatory Visit: Payer: Medicaid Other | Admitting: Family Medicine

## 2016-12-17 VITALS — BP 124/84 | HR 75 | Temp 98.1°F | Resp 16 | Wt 201.0 lb

## 2016-12-17 DIAGNOSIS — Z720 Tobacco use: Secondary | ICD-10-CM

## 2016-12-17 DIAGNOSIS — N529 Male erectile dysfunction, unspecified: Secondary | ICD-10-CM | POA: Diagnosis not present

## 2016-12-17 MED ORDER — BUPROPION HCL ER (XL) 300 MG PO TB24: 300.0000 mg | ORAL_TABLET | Freq: Every day | ORAL | 3 refills | Status: DC

## 2016-12-17 NOTE — Assessment & Plan Note (Signed)
New problem Discussed with patient that this is likely multifactorial, related to vascular disease, HTN, antihypertensives, smoking, etc Will continue tobacco cessation efforts as below Offered Rx for viagra, but patient unable to afford Referral to urology for further eval and management

## 2016-12-17 NOTE — Assessment & Plan Note (Signed)
Uncontrolled, was able to cut back on smoking initially with Buproprion but has increased back to original amount Will increase Bupropion to 300mg  daily Could consider trial of Chantix in the future as well Ok to use nicotine gum in place of cigarettes with Wellbutrin F/u in 1 month

## 2016-12-17 NOTE — Progress Notes (Signed)
Patient: Caleb [name] Male    DOB: October 13, 1955   61 y.o.   MRN: [id number] Visit Date: 12/17/2016  Today's Provider: Lavon Paganini, MD   Chief Complaint  Patient presents with  . Nicotine Dependence   Subjective:    HPI     Follow up for Tobacco Abuse  The patient was last seen for this 4 weeks ago. Changes made at last visit include adding Wellbutrin.  He reports excellent compliance with treatment. He feels that condition is Unchanged. He is still smoking 1 PPD.  He initially was able to cut back significantly, but then worked back up to 1PPD. He is having side effects. Dry Mouth.  ------------------------------------------------------------------------------------ Erectile dysfunction: New problem.  Has not been sexually active for many years.  When resuming sexual activity this weekend, he was embarrassed when he could not achieve an erection.  He states that he was told by his Uncle many times that after getting on BP meds his love life would die.  States that he would not be able to afford Viagra or other medicine without insurance coverage.    No Known Allergies   Current Outpatient Medications:  .  albuterol (PROVENTIL HFA;VENTOLIN HFA) 108 (90 Base) MCG/ACT inhaler, Inhale 1-2 puffs every 6 (six) hours as needed into the lungs for wheezing or shortness of breath., Disp: 1 Inhaler, Rfl: 5 .  aspirin EC 81 MG tablet, Take 81 mg by mouth daily., Disp: , Rfl:  .  buPROPion (WELLBUTRIN XL) 150 MG 24 hr tablet, Take 1 tablet (150 mg total) daily by mouth., Disp: 30 tablet, Rfl: 1 .  cyclobenzaprine (FLEXERIL) 10 MG tablet, Take 1 tablet (10 mg total) by mouth at bedtime., Disp: 30 tablet, Rfl: 1 .  fluticasone (FLOVENT HFA) 220 MCG/ACT inhaler, Inhale 1 puff 2 (two) times daily into the lungs., Disp: 1 Inhaler, Rfl: 5 .  gabapentin (NEURONTIN) 300 MG capsule, Take 2 capsules (600 mg total) by mouth 4 (four) times daily., Disp: 240 capsule, Rfl: 2 .   hydrochlorothiazide (HYDRODIURIL) 25 MG tablet, Take 1 tablet (25 mg total) by mouth daily., Disp: 90 tablet, Rfl: 3 .  isosorbide mononitrate (IMDUR) 30 MG 24 hr tablet, Take 15 mg by mouth daily., Disp: , Rfl:  .  metoprolol tartrate (LOPRESSOR) 25 MG tablet, TAKE 1 TABLET BY MOUTH TWICE A DAY, Disp: 60 tablet, Rfl: 2 .  rosuvastatin (CRESTOR) 40 MG tablet, Take 1 tablet (40 mg total) by mouth daily., Disp: 90 tablet, Rfl: 3 .  tiotropium (SPIRIVA) 18 MCG inhalation capsule, Place 1 capsule (18 mcg total) daily into inhaler and inhale., Disp: 30 capsule, Rfl: 5 .  traMADol (ULTRAM) 50 MG tablet, Take 1 tablet (50 mg total) every 12 (twelve) hours as needed by mouth for severe pain., Disp: 45 tablet, Rfl: 2 .  triamcinolone ointment (KENALOG) 0.5 %, Apply 1 application 2 (two) times daily topically. On psoriasis lesions, Disp: 30 g, Rfl: 2  Review of Systems  Constitutional: Negative for activity change, appetite change, chills, diaphoresis, fatigue, fever and unexpected weight change.  Respiratory: Positive for cough (dry). Negative for chest tightness and shortness of breath.   Cardiovascular: Negative for chest pain, palpitations and leg swelling.    Social History   Tobacco Use  . Smoking status: Current Every Day Smoker    Packs/day: 1.00    Years: 49.00    Pack years: 49.00    Types: Cigarettes  . Smokeless tobacco: Never Used  .  Tobacco comment: started smoking at age 61  Substance Use Topics  . Alcohol use: Yes    Alcohol/week: 2.4 oz    Types: 4 Cans of beer per week   Objective:   BP 124/84 (BP Location: Left Arm, Patient Position: Sitting, Cuff Size: Large)   Pulse 75   Temp 98.1 F (36.7 C) (Oral)   Resp 16   Wt 201 lb (91.2 kg)   SpO2 95%   BMI 30.56 kg/m  Vitals:   12/17/16 0808  BP: 124/84  Pulse: 75  Resp: 16  Temp: 98.1 F (36.7 C)  TempSrc: Oral  SpO2: 95%  Weight: 201 lb (91.2 kg)     Physical Exam  Constitutional: He is oriented to person,  place, and time. He appears well-developed and well-nourished. No distress.  HENT:  Head: Normocephalic and atraumatic.  Right Ear: External ear normal.  Left Ear: External ear normal.  Eyes: Conjunctivae are normal.  Neck: Neck supple. No thyromegaly present.  Cardiovascular: Normal rate, regular rhythm, normal heart sounds and intact distal pulses.  No murmur heard. Pulmonary/Chest: Effort normal and breath sounds normal. No respiratory distress. He has no wheezes. He has no rales.  Genitourinary:  Genitourinary Comments: deferred  Musculoskeletal: He exhibits no edema or deformity.  Lymphadenopathy:    He has no cervical adenopathy.  Neurological: He is alert and oriented to person, place, and time.  Skin: Skin is warm and dry. No rash noted.  Psychiatric: He has a normal mood and affect. His behavior is normal.  Vitals reviewed.       Assessment & Plan:      Problem List Items Addressed This Visit      Genitourinary   Erectile dysfunction    New problem Discussed with patient that this is likely multifactorial, related to vascular disease, HTN, antihypertensives, smoking, etc Will continue tobacco cessation efforts as below Offered Rx for viagra, but patient unable to afford Referral to urology for further eval and management      Relevant Orders   Ambulatory referral to Urology     Other   Tobacco abuse - Primary    Uncontrolled, was able to cut back on smoking initially with Buproprion but has increased back to original amount Will increase Bupropion to 300mg  daily Could consider trial of Chantix in the future as well Ok to use nicotine gum in place of cigarettes with Wellbutrin F/u in 1 month         Return in about 6 weeks (around 01/28/2017) for smoking f/u.     The entirety of the information documented in the History of Present Illness, Review of Systems and Physical Exam were personally obtained by me. Portions of this information were initially  documented by Raquel Sarna Ratchford, CMA and reviewed by me for thoroughness and accuracy.    Virginia Crews, MD, MPH [facility] 12/17/2016 10:08 AM

## 2016-12-17 NOTE — Patient Instructions (Signed)

## 2016-12-30 ENCOUNTER — Other Ambulatory Visit: Payer: Self-pay | Admitting: Family Medicine

## 2016-12-30 NOTE — Telephone Encounter (Signed)
LOV  12/17/2016

## 2017-01-01 ENCOUNTER — Other Ambulatory Visit: Payer: Self-pay | Admitting: Family Medicine

## 2017-01-03 ENCOUNTER — Telehealth: Payer: Self-pay | Admitting: Family Medicine

## 2017-01-03 DIAGNOSIS — L404 Guttate psoriasis: Secondary | ICD-10-CM

## 2017-01-03 NOTE — Telephone Encounter (Signed)
Please review

## 2017-01-03 NOTE — Telephone Encounter (Signed)
Referral placed  Bacigalupo, Dionne Bucy, MD, MPH [facility] 01/03/2017 11:55 AM

## 2017-01-03 NOTE — Telephone Encounter (Signed)
Pt advised and would like referral.

## 2017-01-03 NOTE — Telephone Encounter (Signed)
Pt states he was seen in the office for psoriasis and was worse.  He called stating the psoriasis has gone up his are on his face and neck.

## 2017-01-03 NOTE — Telephone Encounter (Signed)
Advise patient to use triamcinolone ointment on lesions BID and stay moisturized with emollient like Vaseline or Aquafor.  We can refer to Derm if patient wishes to see one.  Virginia Crews, MD, MPH [facility] 01/03/2017 11:22 AM

## 2017-01-06 ENCOUNTER — Ambulatory Visit: Payer: Medicaid Other | Admitting: Urology

## 2017-01-06 ENCOUNTER — Encounter: Payer: Self-pay | Admitting: Urology

## 2017-01-06 VITALS — BP 161/82 | HR 78 | Ht 68.0 in | Wt 201.7 lb

## 2017-01-06 DIAGNOSIS — N529 Male erectile dysfunction, unspecified: Secondary | ICD-10-CM

## 2017-01-06 MED ORDER — SILDENAFIL CITRATE 20 MG PO TABS: ORAL_TABLET | ORAL | 6 refills | Status: DC
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
## 2017-01-18 DIAGNOSIS — L905 Scar conditions and fibrosis of skin: Secondary | ICD-10-CM | POA: Diagnosis not present

## 2017-01-18 DIAGNOSIS — L400 Psoriasis vulgaris: Secondary | ICD-10-CM | POA: Diagnosis not present

## 2017-01-22 ENCOUNTER — Other Ambulatory Visit: Payer: Self-pay | Admitting: Family Medicine

## 2017-01-24 NOTE — Telephone Encounter (Signed)
Called in as above.

## 2017-01-24 NOTE — Telephone Encounter (Signed)
OK to call in #[phone] with 2 refills as above  Kharson Rasmusson, Dionne Bucy, MD, MPH [facility] 01/24/2017 9:22 AM

## 2017-01-31 ENCOUNTER — Ambulatory Visit: Payer: Medicaid Other | Admitting: Cardiovascular Disease

## 2017-02-03 ENCOUNTER — Telehealth: Payer: Self-pay | Admitting: Family Medicine

## 2017-02-03 NOTE — Telephone Encounter (Signed)
Patient brought DVM form to office to be filled out by the provider. Patient states he has appointment on Friday 02-04-17 and would like to pick it up when he comes for his appointment on Friday. The form was put in providers box.  Thanks CC

## 2017-02-03 NOTE — Telephone Encounter (Signed)
Placed on PCP's desk for review.

## 2017-02-04 ENCOUNTER — Encounter: Payer: Self-pay | Admitting: Family Medicine

## 2017-02-04 ENCOUNTER — Ambulatory Visit (INDEPENDENT_AMBULATORY_CARE_PROVIDER_SITE_OTHER): Payer: Medicaid Other | Admitting: Family Medicine

## 2017-02-04 VITALS — BP 122/84 | HR 64 | Temp 97.9°F | Resp 16 | Ht 68.0 in | Wt 204.0 lb

## 2017-02-04 DIAGNOSIS — M79671 Pain in right foot: Secondary | ICD-10-CM

## 2017-02-04 DIAGNOSIS — E876 Hypokalemia: Secondary | ICD-10-CM

## 2017-02-04 DIAGNOSIS — L404 Guttate psoriasis: Secondary | ICD-10-CM

## 2017-02-04 DIAGNOSIS — G8929 Other chronic pain: Secondary | ICD-10-CM

## 2017-02-04 DIAGNOSIS — Z9989 Dependence on other enabling machines and devices: Secondary | ICD-10-CM

## 2017-02-04 DIAGNOSIS — Z720 Tobacco use: Secondary | ICD-10-CM | POA: Diagnosis not present

## 2017-02-04 DIAGNOSIS — Z0001 Encounter for general adult medical examination with abnormal findings: Secondary | ICD-10-CM | POA: Diagnosis not present

## 2017-02-04 DIAGNOSIS — Z0000 Encounter for general adult medical examination without abnormal findings: Secondary | ICD-10-CM

## 2017-02-04 DIAGNOSIS — Z114 Encounter for screening for human immunodeficiency virus [HIV]: Secondary | ICD-10-CM

## 2017-02-04 DIAGNOSIS — Z1159 Encounter for screening for other viral diseases: Secondary | ICD-10-CM

## 2017-02-04 DIAGNOSIS — Z1211 Encounter for screening for malignant neoplasm of colon: Secondary | ICD-10-CM

## 2017-02-04 DIAGNOSIS — J449 Chronic obstructive pulmonary disease, unspecified: Secondary | ICD-10-CM

## 2017-02-04 DIAGNOSIS — G4733 Obstructive sleep apnea (adult) (pediatric): Secondary | ICD-10-CM

## 2017-02-04 DIAGNOSIS — D72829 Elevated white blood cell count, unspecified: Secondary | ICD-10-CM

## 2017-02-04 MED ORDER — VARENICLINE TARTRATE 1 MG PO TABS: 1.0000 mg | ORAL_TABLET | Freq: Two times a day (BID) | ORAL | 3 refills | Status: DC

## 2017-02-04 MED ORDER — VARENICLINE TARTRATE 0.5 MG X 11 & 1 MG X 42 PO MISC: ORAL | 0 refills | Status: DC

## 2017-02-04 NOTE — Assessment & Plan Note (Signed)
Managed by dermatology now Patient is started on TNF blocker
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
## 2017-02-04 NOTE — Assessment & Plan Note (Signed)
Not currently using CPAP Discussed with him the importance of using the CPAP and the consequences of uncontrolled sleep apnea

## 2017-02-04 NOTE — Assessment & Plan Note (Signed)
Uncontrolled 3-5-minute discussion with the patient again about importance of cessation, risk of continued smoking He states that his main driver for quitting smoking is that women will want to date him more if he quit smoking Though he did cut back significantly on his smoking from 2 packs a day to 0.5-0.75 PPD, does not seem that bupropion has been sufficient to help him quit We will stop bupropion and start Chantix Discussed initial titration schedule Discussed picking a quit date Okay to use Nicorette gum while using Chantix if needed Follow-up in 1 month

## 2017-02-04 NOTE — Assessment & Plan Note (Signed)
Poorly controlled No signs of exacerbation, however Lungs are clear today Discussed importance of controller medications with the patient Resume Spiriva, Flovent, albuterol as needed

## 2017-02-04 NOTE — Patient Instructions (Signed)
Preventive Care 40-64 Years, Male Preventive care refers to lifestyle choices and visits with your health care provider that can promote health and wellness. What does preventive care include?  A yearly physical exam. This is also called an annual well check.  Dental exams once or twice a year.  Routine eye exams. Ask your health care provider how often you should have your eyes checked.  Personal lifestyle choices, including: ? Daily care of your teeth and gums. ? Regular physical activity. ? Eating a healthy diet. ? Avoiding tobacco and drug use. ? Limiting alcohol use. ? Practicing safe sex. ? Taking low-dose aspirin every day starting at age 60. What happens during an annual well check? The services and screenings done by your health care provider during your annual well check will depend on your age, overall health, lifestyle risk factors, and family history of disease. Counseling Your health care provider may ask you questions about your:  Alcohol use.  Tobacco use.  Drug use.  Emotional well-being.  Home and relationship well-being.  Sexual activity.  Eating habits.  Work and work Statistician.  Screening You may have the following tests or measurements:  Height, weight, and BMI.  Blood pressure.  Lipid and cholesterol levels. These may be checked every 5 years, or more frequently if you are over 60 years old.  Skin check.  Lung cancer screening. You may have this screening every year starting at age 60 if you have a 30-pack-year history of smoking and currently smoke or have quit within the past 15 years.  Fecal occult blood test (FOBT) of the stool. You may have this test every year starting at age 60.  Flexible sigmoidoscopy or colonoscopy. You may have a sigmoidoscopy every 5 years or a colonoscopy every 10 years starting at age 60.  Prostate cancer screening. Recommendations will vary depending on your family history and other risks.  Hepatitis C  blood test.  Hepatitis B blood test.  Sexually transmitted disease (STD) testing.  Diabetes screening. This is done by checking your blood sugar (glucose) after you have not eaten for a while (fasting). You may have this done every 1-3 years.  Discuss your test results, treatment options, and if necessary, the need for more tests with your health care provider. Vaccines Your health care provider may recommend certain vaccines, such as:  Influenza vaccine. This is recommended every year.  Tetanus, diphtheria, and acellular pertussis (Tdap, Td) vaccine. You may need a Td booster every 10 years.  Varicella vaccine. You may need this if you have not been vaccinated.  Zoster vaccine. You may need this after age 60.  Measles, mumps, and rubella (MMR) vaccine. You may need at least one dose of MMR if you were born in 1955 or later. You may also need a second dose.  Pneumococcal 13-valent conjugate (PCV13) vaccine. You may need this if you have certain conditions and have not been vaccinated.  Pneumococcal polysaccharide (PPSV23) vaccine. You may need one or two doses if you smoke cigarettes or if you have certain conditions.  Meningococcal vaccine. You may need this if you have certain conditions.  Hepatitis A vaccine. You may need this if you have certain conditions or if you travel or work in places where you may be exposed to hepatitis A.  Hepatitis B vaccine. You may need this if you have certain conditions or if you travel or work in places where you may be exposed to hepatitis B.  Haemophilus influenzae type b (Hib) vaccine.  You may need this if you have certain risk factors.  Talk to your health care provider about which screenings and vaccines you need and how often you need them. This information is not intended to replace advice given to you by your health care provider. Make sure you discuss any questions you have with your health care provider. Document Released: 01/17/2015  Document Revised: 09/10/2015 Document Reviewed: 10/22/2014 Elsevier Interactive Patient Education  [employer].

## 2017-02-04 NOTE — Telephone Encounter (Signed)
Completed and returned to patient at today's visit.  Virginia Crews, MD, MPH [facility] 02/04/2017 3:45 PM

## 2017-02-04 NOTE — Assessment & Plan Note (Signed)
Continues to have chronic right foot pain Continue gabapentin and tramadol at current doses Discussed that no early refills will be given Referral to pain management for possible interventional pain management strategies

## 2017-02-05 LAB — CBC
Hematocrit: 47.7 % (ref 37.5–51.0)
Hemoglobin: 16.7 g/dL (ref 13.0–17.7)
MCH: 31.3 pg (ref 26.6–33.0)
MCHC: 35 g/dL (ref 31.5–35.7)
MCV: 89 fL (ref 79–97)
Platelets: 248 10*3/uL (ref 150–379)
RBC: 5.34 x10E6/uL (ref 4.14–5.80)
RDW: 13.2 % (ref 12.3–15.4)
WBC: 12 10*3/uL — ABNORMAL HIGH (ref 3.4–10.8)

## 2017-02-05 LAB — BASIC METABOLIC PANEL
BUN/Creatinine Ratio: 17 (ref 9–20)
BUN: 14 mg/dL (ref 6–24)
CO2: 24 mmol/L (ref 20–29)
Calcium: 9.6 mg/dL (ref 8.7–10.2)
Chloride: 98 mmol/L (ref 96–106)
Creatinine, Ser: 0.81 mg/dL (ref 0.76–1.27)
GFR calc Af Amer: 112 mL/min/{1.73_m2} (ref >59)
GFR calc non Af Amer: 97 mL/min/{1.73_m2} (ref >59)
Glucose: 90 mg/dL (ref 65–99)
Potassium: 4.5 mmol/L (ref 3.5–5.2)
Sodium: 140 mmol/L (ref 134–144)

## 2017-02-05 LAB — HIV ANTIBODY (ROUTINE TESTING W REFLEX): HIV Screen 4th Generation wRfx: NONREACTIVE

## 2017-02-05 LAB — HEPATITIS C ANTIBODY: Hep C Virus Ab: <0.1 s/co ratio (ref 0.0–0.9)

## 2017-02-09 ENCOUNTER — Telehealth: Payer: Self-pay

## 2017-02-09 ENCOUNTER — Other Ambulatory Visit: Payer: Self-pay

## 2017-02-09 DIAGNOSIS — I2510 Atherosclerotic heart disease of native coronary artery without angina pectoris: Secondary | ICD-10-CM | POA: Insufficient documentation

## 2017-02-09 DIAGNOSIS — Z1211 Encounter for screening for malignant neoplasm of colon: Secondary | ICD-10-CM
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
## 2017-02-09 NOTE — Telephone Encounter (Signed)
Gastroenterology Pre-Procedure Review  Request Date: 02/24/17 Requesting Physician: Dr. Vicente Males  PATIENT REVIEW QUESTIONS: The patient responded to the following health history questions as indicated:    1. Are you having any GI issues? no 2. Do you have a personal history of Polyps? yes (3 years ago) 3. Do you have a family history of Colon Cancer or Polyps? no 4. Diabetes Mellitus? no 5. Joint replacements in the past 12 months?no 6. Major health problems in the past 3 months?no 7. Any artificial heart valves, MVP, or defibrillator?2 stents in artery    MEDICATIONS & ALLERGIES:    Patient reports the following regarding taking any anticoagulation/antiplatelet therapy:   Plavix, Coumadin, Eliquis, Xarelto, Lovenox, Pradaxa, Brilinta, or Effient? no Aspirin? yes (81 mg daily)  Patient confirms/reports the following medications:  Current Outpatient Medications  Medication Sig Dispense Refill  . albuterol (PROVENTIL HFA;VENTOLIN HFA) 108 (90 Base) MCG/ACT inhaler Inhale 1-2 puffs every 6 (six) hours as needed into the lungs for wheezing or shortness of breath. (Patient not taking: Reported on 01/06/2017) 1 Inhaler 5  . aspirin EC 81 MG tablet Take 81 mg by mouth daily.    . cyclobenzaprine (FLEXERIL) 10 MG tablet TAKE 1 TABLET BY MOUTH EVERYDAY AT BEDTIME 30 tablet 1  . fluticasone (FLOVENT HFA) 220 MCG/ACT inhaler Inhale 1 puff 2 (two) times daily into the lungs. (Patient not taking: Reported on 02/04/2017) 1 Inhaler 5  . gabapentin (NEURONTIN) 300 MG capsule TAKE 2 CAPSULES (600 MG TOTAL) BY MOUTH 4 (FOUR) TIMES DAILY. 240 capsule 1  . hydrochlorothiazide (HYDRODIURIL) 25 MG tablet Take 1 tablet (25 mg total) by mouth daily. 90 tablet 3  . isosorbide mononitrate (IMDUR) 30 MG 24 hr tablet TAKE 0.5 TABLET(S) EVERY DAY BY ORAL ROUTE IN THE MORNING.  3  . metoprolol tartrate (LOPRESSOR) 25 MG tablet TAKE 1 TABLET BY MOUTH TWICE A DAY 60 tablet 2  . rosuvastatin (CRESTOR) 40 MG tablet Take 1  tablet (40 mg total) by mouth daily. 90 tablet 3  . sildenafil (REVATIO) 20 MG tablet Take 1 to 5 tabs PO daily prn 30 tablet 6  . tiotropium (SPIRIVA) 18 MCG inhalation capsule Place 1 capsule (18 mcg total) daily into inhaler and inhale. (Patient not taking: Reported on 01/06/2017) 30 capsule 5  . traMADol (ULTRAM) 50 MG tablet TAKE 1 TABLET BY MOUTH EVERY 12 HOURS AS NEEDED SEVERE PAIN 45 tablet 2  . triamcinolone ointment (KENALOG) 0.5 % Apply 1 application 2 (two) times daily topically. On psoriasis lesions 30 g 2  . varenicline (CHANTIX CONTINUING MONTH PAK) 1 MG tablet Take 1 tablet (1 mg total) by mouth 2 (two) times daily. 60 tablet 3  . varenicline (CHANTIX STARTING MONTH PAK) 0.5 MG X 11 & 1 MG X 42 tablet Take one 0.5 mg tablet by mouth once daily for 3 days, then increase to one 0.5 mg tablet BID for 4 days, then increase to one 1 mg tab BID. 53 tablet 0   No current facility-administered medications for this visit.     Patient confirms/reports the following allergies:  No Known Allergies  No orders of the defined types were placed in this encounter.   AUTHORIZATION INFORMATION Primary Insurance: 1D#: Group #:  Secondary Insurance: 1D#: Group #:  SCHEDULE INFORMATION: Date: 02/24/17 Time: Location:[facility]

## 2017-02-10 ENCOUNTER — Encounter: Payer: Self-pay | Admitting: Cardiovascular Disease

## 2017-02-10 ENCOUNTER — Ambulatory Visit: Payer: Medicaid Other | Admitting: Cardiovascular Disease

## 2017-02-10 VITALS — BP 118/68 | HR 72 | Ht 68.0 in | Wt 200.8 lb

## 2017-02-10 DIAGNOSIS — I25118 Atherosclerotic heart disease of native coronary artery with other forms of angina pectoris: Secondary | ICD-10-CM | POA: Diagnosis not present

## 2017-02-10 DIAGNOSIS — J449 Chronic obstructive pulmonary disease, unspecified: Secondary | ICD-10-CM | POA: Diagnosis not present

## 2017-02-10 DIAGNOSIS — Z720 Tobacco use: Secondary | ICD-10-CM

## 2017-02-10 DIAGNOSIS — I10 Essential (primary) hypertension: Secondary | ICD-10-CM

## 2017-02-10 DIAGNOSIS — E7800 Pure hypercholesterolemia, unspecified: Secondary | ICD-10-CM | POA: Diagnosis not present

## 2017-02-10 NOTE — Patient Instructions (Addendum)

## 2017-02-12 ENCOUNTER — Other Ambulatory Visit: Payer: Self-pay | Admitting: Family Medicine

## 2017-02-14 NOTE — Telephone Encounter (Signed)
LOV 02/04/2017.

## 2017-02-17 ENCOUNTER — Encounter: Payer: Self-pay | Admitting: Nurse Practitioner

## 2017-02-17 ENCOUNTER — Ambulatory Visit: Payer: Medicaid Other | Attending: Nurse Practitioner | Admitting: Nurse Practitioner

## 2017-02-17 ENCOUNTER — Other Ambulatory Visit: Payer: Self-pay

## 2017-02-17 VITALS — BP 122/83 | HR 77 | Temp 98.0°F | Resp 16 | Ht 68.0 in | Wt 200.0 lb

## 2017-02-17 DIAGNOSIS — L404 Guttate psoriasis: Secondary | ICD-10-CM | POA: Insufficient documentation

## 2017-02-17 DIAGNOSIS — M899 Disorder of bone, unspecified: Secondary | ICD-10-CM

## 2017-02-17 DIAGNOSIS — I509 Heart failure, unspecified: Secondary | ICD-10-CM | POA: Diagnosis not present

## 2017-02-17 DIAGNOSIS — M79671 Pain in right foot: Secondary | ICD-10-CM

## 2017-02-17 DIAGNOSIS — F17210 Nicotine dependence, cigarettes, uncomplicated: Secondary | ICD-10-CM | POA: Diagnosis not present

## 2017-02-17 DIAGNOSIS — Z981 Arthrodesis status: Secondary | ICD-10-CM | POA: Insufficient documentation

## 2017-02-17 DIAGNOSIS — G4733 Obstructive sleep apnea (adult) (pediatric): Secondary | ICD-10-CM | POA: Diagnosis not present

## 2017-02-17 DIAGNOSIS — M546 Pain in thoracic spine: Secondary | ICD-10-CM | POA: Insufficient documentation

## 2017-02-17 DIAGNOSIS — G894 Chronic pain syndrome: Secondary | ICD-10-CM | POA: Diagnosis not present

## 2017-02-17 DIAGNOSIS — Z79899 Other long term (current) drug therapy: Secondary | ICD-10-CM | POA: Diagnosis not present

## 2017-02-17 DIAGNOSIS — Z79891 Long term (current) use of opiate analgesic: Secondary | ICD-10-CM | POA: Insufficient documentation

## 2017-02-17 DIAGNOSIS — N529 Male erectile dysfunction, unspecified: Secondary | ICD-10-CM | POA: Insufficient documentation

## 2017-02-17 DIAGNOSIS — I110 Hypertensive heart disease with heart failure: Secondary | ICD-10-CM | POA: Insufficient documentation

## 2017-02-17 DIAGNOSIS — I252 Old myocardial infarction: Secondary | ICD-10-CM | POA: Diagnosis not present

## 2017-02-17 DIAGNOSIS — J449 Chronic obstructive pulmonary disease, unspecified: Secondary | ICD-10-CM | POA: Insufficient documentation

## 2017-02-17 DIAGNOSIS — Z789 Other specified health status: Secondary | ICD-10-CM | POA: Diagnosis not present

## 2017-02-17 DIAGNOSIS — Z7982 Long term (current) use of aspirin: Secondary | ICD-10-CM | POA: Diagnosis not present

## 2017-02-17 DIAGNOSIS — G8929 Other chronic pain: Secondary | ICD-10-CM | POA: Diagnosis not present

## 2017-02-17 DIAGNOSIS — I2510 Atherosclerotic heart disease of native coronary artery without angina pectoris: Secondary | ICD-10-CM | POA: Diagnosis not present

## 2017-02-17 DIAGNOSIS — M25571 Pain in right ankle and joints of right foot: Secondary | ICD-10-CM | POA: Insufficient documentation

## 2017-02-17 DIAGNOSIS — E785 Hyperlipidemia, unspecified: Secondary | ICD-10-CM | POA: Insufficient documentation

## 2017-02-17 NOTE — Progress Notes (Signed)
Safety precautions to be maintained throughout the outpatient stay will include: orient to surroundings, keep bed in low position, maintain call bell within reach at all times, provide assistance with transfer out of bed and ambulation.  

## 2017-02-17 NOTE — Patient Instructions (Signed)

## 2017-02-17 NOTE — Progress Notes (Signed)
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 Unrestricted ROM          Muscle strength & Tone: Functionally intact  Muscle strength & Tone: Functionally intact  Sensory: Unimpaired  Sensory: Unimpaired  Palpation: No palpable anomalies              Palpation: No palpable anomalies              Specialized Test(s): Deferred         Specialized Test(s): Deferred          Thoracic Spine Exam  Inspection: No masses, redness, or swelling Alignment: Symmetrical Functional ROM: Unrestricted ROM Stability: No instability detected Sensory: Unimpaired Muscle strength & Tone: Complains of area being tender to palpation  Lumbar Spine Exam  Inspection: No masses, redness, or swelling Alignment: Symmetrical Functional ROM: Unrestricted ROM      Stability: No instability detected Muscle strength & Tone: Functionally intact Sensory: Unimpaired Palpation: No palpable anomalies       Provocative Tests: Lumbar Hyperextension and rotation test: evaluation deferred today       Patrick's Maneuver: evaluation deferred today                     Gait & Posture Assessment  Ambulation: Unassisted Gait: Relatively normal for age and body habitus Posture: WNL   Lower Extremity Exam    Side: Right lower extremity  Side: Left lower extremity  Inspection:   Inspection: No masses, redness, swelling, or asymmetry. No contractures  Functional ROM: Restricted ROM to toes          Functional ROM: Unrestricted ROM          Muscle strength & Tone: Deconditioned  Muscle strength & Tone: Functionally intact  Sensory:  Hyperesthesia (Increased sensitivity to touch)  Sensory: Unimpaired  Palpation: No palpable anomalies  Palpation: No palpable anomalies   CRPS/RSD Assessment (AMA Diagnostic Criteria)   Subjective criteria: right foot   1). Continuing pain, which is disproportionate to any inciting event. Absent   2). Must report at least 1 symptom in 3 of the 4 following categories:  Sensory: Reports of hyperesthesia and/or allodynia. Present  Vasomotor: Reports of temperature asymmetry and/or skin color changes and/or skin color asymmetry. Present  Sudomotor/Edema: Reports of edema and/or sweating changes and/or sweating asymmetry. Present  Motor/Trophic: Reports of decreased range of motion and/or motor dysfunction (weakness, tremor, dystonia) and/or trophic changes (hair, nail, skin). Present  Objective criteria:   3). Must display at least 1 sign at time of evaluation in 2 or more of the following categories:  Sensory: Evidence of hyperalgesia (to pinprick) and/or allodynia (to light touch and/or deep somatic pressure and/or joint movement). Present  Vasomotor: Evidence of temperature asymmetry and/or skin color changes and/or asymmetry. Skin color: Mottled or cyanotic (1). Skin temperature: Cool (1).      1 Point  Sudomotor/Edema: Evidence of edema and/or sweating changes and/or sweating asymmetry. Edema (1). Skin dry or overly moist (1).       1 Point  Motor/Trophic: Evidence of decreased range of motion and/or motor  dysfunction (weakness, tremor, dystonia) and/or trophic changes (hair, nail, skin). Skin texture: Smooth, nonelastic (1). Soft tissue atrophy: especially digit tips (1). Joint stiffness and decreased passive motion (1). Nail changes: blemished, curved, talon-like (1). Hair growth changes: fallout, longer, finer (1).       5 Points  Radiographic signs: Radiographs: Trophic bone changes, osteoporosis (1). Bone scan: findings consistent with CRPS (1). No tests available             4). There is no other diagnosis that better explains the signs and symptoms.   Objective criteria Legend   Number of Points Class  <4 0  > or equal to 4 1  > or equal to 6 2  > or equal to 8 3-4   Assessment  Primary Diagnosis & Pertinent Problem List: The primary encounter diagnosis was Chronic foot pain, right. 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  Visit Diagnosis: 1. Chronic foot pain, right   2. Chronic pain of right ankle (Secondary Area of Pain)   3. Chronic midline thoracic back pain [facility] Area of Pain)   4. Chronic pain syndrome   5. Long term current use of opiate analgesic   6. Pharmacologic therapy   7. Disorder of skeletal system   8. Problems influencing health status    Plan of Care  Initial treatment plan:  Please be advised that as per protocol, today's visit has been an evaluation only. We have not taken over the patient's controlled substance management.  Problem-specific plan: No problem-specific Assessment & Plan notes found for this encounter.  Ordered Lab-work, Procedure(s), Referral(s), & Consult(s): Orders Placed This Encounter  Procedures  . DG Ankle Complete Right  . DG Foot Complete Right  . DG  Thoracic Spine 2 View  . Compliance Drug Analysis, Ur  . Comp. Metabolic Panel (12)  . Magnesium  . Vitamin B12  . Sedimentation rate  . 25-Hydroxyvitamin D Lcms D2+D3  .  C-reactive protein  . Ambulatory referral to Psychology   Pharmacotherapy: Medications ordered:  No orders of the defined types were placed in this encounter.  Medications administered during this visit: Lynwood L. Ostrand had no medications administered during this visit.   Pharmacotherapy under consideration:  Opioid Analgesics: The patient was informed that there is no guarantee that he would be a candidate for opioid analgesics. The decision will be made following CDC guidelines. This decision will be based on the results of diagnostic studies, as well as Mr. Stofko risk profile.  Membrane stabilizer: To be determined at a later time Muscle relaxant: To be determined at a later time NSAID: To be determined at a later time Other analgesic(s): To be determined at a later time   Interventional therapies under consideration: Mr. Crisman was informed that there is no guarantee that he would be a candidate for interventional therapies. The decision will be based on the results of diagnostic studies, as well as [name] risk profile.  Possible procedure(s): Diagnostic sympathetic nerve block Diagnostic midline Thoracic facet block   Provider-requested follow-up: Return for 2nd Visit, w/ Dr. Dossie Arbour, after [facility] eval.  Future Appointments  Date Time Provider [place]  03/03/2017  1:30 PM Virginia Crews, MD [facility] None  04/07/2017  8:45 AM [facility] None    Primary Care Physician: Virginia Crews, MD Location: [facility] Note by:  Date: 02/17/2017; Time: 12:24 PM  Pain Score Disclaimer: We use the NRS-11 scale. This is a self-reported, subjective measurement of pain severity with only modest accuracy. It is used primarily to  identify changes within a particular patient. It must be understood that outpatient pain scales are significantly less accurate that those used for research, where they can be applied under ideal controlled circumstances with minimal exposure to variables. In reality, the score is likely to be a combination of pain intensity and pain affect, where pain affect describes the degree of emotional arousal or changes in action readiness caused by the sensory experience of pain. Factors such as social and work situation, setting, emotional state, anxiety levels, expectation, and prior pain experience may influence pain perception and show large inter-individual differences that may also be affected by time variables.  Patient instructions provided during this appointment: Patient Instructions    ____________________________________________________________________________________________  Appointment Policy Summary  It is our goal and responsibility to provide the medical community with assistance in the evaluation and management of patients with chronic pain. Unfortunately our resources are limited. Because we do not have an unlimited amount of time, or available appointments, we are required to closely monitor and manage their use. The following rules exist to maximize their use:  Patient's responsibilities: 1. Punctuality:  At what time should I arrive? You should be physically present in our office 30 minutes before your scheduled appointment. Your scheduled appointment is with your assigned healthcare provider. However, it takes 5-10 minutes to be "checked-in", and another 15 minutes for the nurses to do the admission. If you arrive to our office at the time you were given for your appointment, you will end up being at least 20-25 minutes late to your appointment with the provider. 2. Tardiness:  What happens if I arrive only a few minutes after my scheduled appointment time? You will need to reschedule  your appointment. The cutoff is your appointment time. This is why it is so important that you arrive at least 30 minutes before that appointment. If you have  an appointment scheduled for 10:00 AM and you arrive at 10:01, you will be required to reschedule your appointment.  3. Plan ahead:  Always assume that you will encounter traffic on your way in. Plan for it. If you are dependent on a driver, make sure they understand these rules and the need to arrive early. 4. Other appointments and responsibilities:  Avoid scheduling any other appointments before or after your pain clinic appointments.  5. Be prepared:  Write down everything that you need to discuss with your healthcare provider and give this information to the admitting nurse. Write down the medications that you will need refilled. Bring your pills and bottles (even the empty ones), to all of your appointments, except for those where a procedure is scheduled. 6. No children or pets:  Find someone to take care of them. It is not appropriate to bring them in. 7. Scheduling changes:  We request "advanced notification" of any changes or cancellations. 8. Advanced notification:  Defined as a time period of more than 24 hours prior to the originally scheduled appointment. This allows for the appointment to be offered to other patients. 9. Rescheduling:  When a visit is rescheduled, it will require the cancellation of the original appointment. For this reason they both fall within the category of "Cancellations".  10. Cancellations:  They require advanced notification. Any cancellation less than 24 hours before the  appointment will be recorded as a "No Show". 11. No Show:  Defined as an unkept appointment where the patient failed to notify or declare to the practice their intention or inability to keep the appointment.  Corrective process for repeat offenders:  1. Tardiness: Three (3) episodes of rescheduling due to late arrivals will be  recorded as one (1) "No Show". 2. Cancellation or reschedule: Three (3) cancellations or rescheduling will be recorded as one (1) "No Show". 3. "No Shows": Three (3) "No Shows" within a 12 month period will result in discharge from the practice.  ____________________________________________________________________________________________   ____________________________________________________________________________________________  Pain Scale  Introduction: The pain score used by this practice is the Verbal Numerical Rating Scale (VNRS-11). This is an 11-point scale. It is for adults and children 10 years or older. There are significant differences in how the pain score is reported, used, and applied. Forget everything you learned in the past and learn this scoring system.  General Information: The scale should reflect your current level of pain. Unless you are specifically asked for the level of your worst pain, or your average pain. If you are asked for one of these two, then it should be understood that it is over the past 24 hours.  Basic Activities of Daily Living (ADL): Personal hygiene, dressing, eating, transferring, and using restroom.  Instructions: Most patients tend to report their level of pain as a combination of two factors, their physical pain and their psychosocial pain. This last one is also known as "suffering" and it is reflection of how physical pain affects you socially and psychologically. From now on, report them separately. From this point on, when asked to report your pain level, report only your physical pain. Use the following table for reference.  Pain Clinic Pain Levels (0-5/10)  Pain Level Score  Description  No Pain 0   Mild pain 1 Nagging, annoying, but does not interfere with basic activities of daily living (ADL). Patients are able to eat, bathe, get dressed, toileting (being able to get on and off the toilet and perform personal hygiene functions),  transfer  (move in and out of bed or a chair without assistance), and maintain continence (able to control bladder and bowel functions). Blood pressure and heart rate are unaffected. A normal heart rate for a healthy adult ranges from 60 to 100 bpm (beats per minute).   Mild to moderate pain 2 Noticeable and distracting. Impossible to hide from other people. More frequent flare-ups. Still possible to adapt and function close to normal. It can be very annoying and may have occasional stronger flare-ups. With discipline, patients may get used to it and adapt.   Moderate pain 3 Interferes significantly with activities of daily living (ADL). It becomes difficult to feed, bathe, get dressed, get on and off the toilet or to perform personal hygiene functions. Difficult to get in and out of bed or a chair without assistance. Very distracting. With effort, it can be ignored when deeply involved in activities.   Moderately severe pain 4 Impossible to ignore for more than a few minutes. With effort, patients may still be able to manage work or participate in some social activities. Very difficult to concentrate. Signs of autonomic nervous system discharge are evident: dilated pupils (mydriasis); mild sweating (diaphoresis); sleep interference. Heart rate becomes elevated (>115 bpm). Diastolic blood pressure (lower number) rises above 100 mmHg. Patients find relief in laying down and not moving.   Severe pain 5 Intense and extremely unpleasant. Associated with frowning face and frequent crying. Pain overwhelms the senses.  Ability to do any activity or maintain social relationships becomes significantly limited. Conversation becomes difficult. Pacing back and forth is common, as getting into a comfortable position is nearly impossible. Pain wakes you up from deep sleep. Physical signs will be obvious: pupillary dilation; increased sweating; goosebumps; brisk reflexes; cold, clammy hands and feet; nausea, vomiting or dry heaves;  loss of appetite; significant sleep disturbance with inability to fall asleep or to remain asleep. When persistent, significant weight loss is observed due to the complete loss of appetite and sleep deprivation.  Blood pressure and heart rate becomes significantly elevated. Caution: If elevated blood pressure triggers a pounding headache associated with blurred vision, then the patient should immediately seek attention at an urgent or emergency care unit, as these may be signs of an impending stroke.    Emergency Department Pain Levels (6-10/10)  Emergency Room Pain 6 Severely limiting. Requires emergency care and should not be seen or managed at an outpatient pain management facility. Communication becomes difficult and requires great effort. Assistance to reach the emergency department may be required. Facial flushing and profuse sweating along with potentially dangerous increases in heart rate and blood pressure will be evident.   Distressing pain 7 Self-care is very difficult. Assistance is required to transport, or use restroom. Assistance to reach the emergency department will be required. Tasks requiring coordination, such as bathing and getting dressed become very difficult.   Disabling pain 8 Self-care is no longer possible. At this level, pain is disabling. The individual is unable to do even the most "basic" activities such as walking, eating, bathing, dressing, transferring to a bed, or toileting. Fine motor skills are lost. It is difficult to think clearly.   Incapacitating pain 9 Pain becomes incapacitating. Thought processing is no longer possible. Difficult to remember your own name. Control of movement and coordination are lost.   The worst pain imaginable 10 At this level, most patients pass out from pain. When this level is reached, collapse of the autonomic nervous system occurs, leading to a  sudden drop in blood pressure and heart rate. This in turn results in a temporary and  dramatic drop in blood flow to the brain, leading to a loss of consciousness. Fainting is one of the body's self defense mechanisms. Passing out puts the brain in a calmed state and causes it to shut down for a while, in order to begin the healing process.    Summary: 1. Refer to this scale when providing [place] with your pain level. 2. Be accurate and careful when reporting your pain level. This will help with your care. 3. Over-reporting your pain level will lead to loss of credibility. 4. Even a level of 1/10 means that there is pain and will be treated at our facility. 5. High, inaccurate reporting will be documented as "Symptom Exaggeration", leading to loss of credibility and suspicions of possible secondary gains such as obtaining more narcotics, or wanting to appear disabled, for fraudulent reasons. 6. Only pain levels of 5 or below will be seen at our facility. 7. Pain levels of 6 and above will be sent to the Emergency Department and the appointment cancelled. ____________________________________________________________________________________________

## 2017-02-18 ENCOUNTER — Ambulatory Visit
Admission: RE | Admit: 2017-02-18 | Discharge: 2017-02-18 | Disposition: A | Discharge status: Home / Self Care | Payer: Medicaid Other | Source: Ambulatory Visit | Attending: Nurse Practitioner | Admitting: Nurse Practitioner

## 2017-02-18 ENCOUNTER — Other Ambulatory Visit
Admission: RE | Admit: 2017-02-18 | Discharge: 2017-02-18 | Disposition: A | Discharge status: Home / Self Care | Payer: Medicaid Other | Source: Ambulatory Visit | Attending: Nurse Practitioner | Admitting: Nurse Practitioner

## 2017-02-18 DIAGNOSIS — M25571 Pain in right ankle and joints of right foot: Secondary | ICD-10-CM | POA: Diagnosis present

## 2017-02-18 DIAGNOSIS — M47892 Other spondylosis, cervical region: Secondary | ICD-10-CM | POA: Diagnosis not present

## 2017-02-18 DIAGNOSIS — M79671 Pain in right foot: Secondary | ICD-10-CM | POA: Diagnosis present

## 2017-02-18 DIAGNOSIS — M47894 Other spondylosis, thoracic region: Secondary | ICD-10-CM | POA: Insufficient documentation

## 2017-02-18 DIAGNOSIS — G8929 Other chronic pain: Secondary | ICD-10-CM

## 2017-02-18 DIAGNOSIS — M546 Pain in thoracic spine: Secondary | ICD-10-CM | POA: Insufficient documentation

## 2017-02-21 NOTE — Progress Notes (Signed)
Results were reviewed and found to be: mildly abnormal  No acute injury or pathology identified  Review would suggest interventional pain management techniques may be of benefit 

## 2017-02-21 NOTE — Progress Notes (Signed)
Results were reviewed and found to be: abnormal  No acute injury or pathology identified, further images maybe useful  Review would suggest interventional pain management techniques may be of benefit

## 2017-02-22 LAB — COMP. METABOLIC PANEL (12)
AST: 19 IU/L (ref 0–40)
Albumin/Globulin Ratio: 1.5 (ref 1.2–2.2)
Albumin: 4.4 g/dL (ref 3.5–5.5)
Alkaline Phosphatase: 89 IU/L (ref 39–117)
BUN/Creatinine Ratio: 22 — ABNORMAL HIGH (ref 9–20)
BUN: 18 mg/dL (ref 6–24)
Bilirubin Total: 0.3 mg/dL (ref 0.0–1.2)
Calcium: 9.9 mg/dL (ref 8.7–10.2)
Chloride: 93 mmol/L — ABNORMAL LOW (ref 96–106)
Creatinine, Ser: 0.83 mg/dL (ref 0.76–1.27)
GFR calc Af Amer: 111 mL/min/{1.73_m2} (ref >59)
GFR calc non Af Amer: 96 mL/min/{1.73_m2} (ref >59)
Globulin, Total: 3 g/dL (ref 1.5–4.5)
Glucose: 115 mg/dL — ABNORMAL HIGH (ref 65–99)
Potassium: 3.8 mmol/L (ref 3.5–5.2)
Sodium: 135 mmol/L (ref 134–144)
Total Protein: 7.4 g/dL (ref 6.0–8.5)

## 2017-02-22 LAB — MAGNESIUM: Magnesium: 2 mg/dL (ref 1.6–2.3)

## 2017-02-22 LAB — C-REACTIVE PROTEIN: CRP: 7.3 mg/L — ABNORMAL HIGH (ref 0.0–4.9)

## 2017-02-22 LAB — VITAMIN B12: Vitamin B-12: 403 pg/mL (ref 232–1245)

## 2017-02-22 LAB — SEDIMENTATION RATE: Sed Rate: 38 mm/hr — ABNORMAL HIGH (ref 0–30)

## 2017-02-22 LAB — 25-HYDROXY VITAMIN D LCMS D2+D3
25-Hydroxy, Vitamin D-2: <1 ng/mL
25-Hydroxy, Vitamin D-3: 32 ng/mL
25-Hydroxy, Vitamin D: 32 ng/mL

## 2017-02-23 LAB — COMPLIANCE DRUG ANALYSIS, UR

## 2017-02-24 ENCOUNTER — Ambulatory Visit: Payer: Medicaid Other | Admitting: Certified Registered Nurse Anesthetist

## 2017-02-24 ENCOUNTER — Encounter
Admission: RE | Disposition: A | Discharge status: Home / Self Care | Payer: Self-pay | Source: Ambulatory Visit | Attending: Gastroenterology

## 2017-02-24 ENCOUNTER — Encounter: Payer: Self-pay | Admitting: Certified Registered Nurse Anesthetist

## 2017-02-24 ENCOUNTER — Ambulatory Visit
Admission: RE | Admit: 2017-02-24 | Discharge: 2017-02-24 | Disposition: A | Discharge status: Home / Self Care | Payer: Medicaid Other | Source: Ambulatory Visit | Attending: Gastroenterology | Admitting: Gastroenterology

## 2017-02-24 DIAGNOSIS — E785 Hyperlipidemia, unspecified: Secondary | ICD-10-CM | POA: Diagnosis not present

## 2017-02-24 DIAGNOSIS — Z1211 Encounter for screening for malignant neoplasm of colon: Secondary | ICD-10-CM | POA: Insufficient documentation

## 2017-02-24 DIAGNOSIS — Z8249 Family history of ischemic heart disease and other diseases of the circulatory system: Secondary | ICD-10-CM | POA: Diagnosis not present

## 2017-02-24 DIAGNOSIS — Z9049 Acquired absence of other specified parts of digestive tract: Secondary | ICD-10-CM | POA: Insufficient documentation

## 2017-02-24 DIAGNOSIS — Z7982 Long term (current) use of aspirin: Secondary | ICD-10-CM | POA: Insufficient documentation

## 2017-02-24 DIAGNOSIS — I252 Old myocardial infarction: Secondary | ICD-10-CM | POA: Diagnosis not present

## 2017-02-24 DIAGNOSIS — Z955 Presence of coronary angioplasty implant and graft: Secondary | ICD-10-CM | POA: Diagnosis not present

## 2017-02-24 DIAGNOSIS — Z9989 Dependence on other enabling machines and devices: Secondary | ICD-10-CM | POA: Diagnosis not present

## 2017-02-24 DIAGNOSIS — Z8719 Personal history of other diseases of the digestive system: Secondary | ICD-10-CM | POA: Insufficient documentation

## 2017-02-24 DIAGNOSIS — I110 Hypertensive heart disease with heart failure: Secondary | ICD-10-CM | POA: Diagnosis not present

## 2017-02-24 DIAGNOSIS — Z79899 Other long term (current) drug therapy: Secondary | ICD-10-CM | POA: Diagnosis not present

## 2017-02-24 DIAGNOSIS — Z8371 Family history of colonic polyps: Secondary | ICD-10-CM | POA: Diagnosis not present

## 2017-02-24 DIAGNOSIS — G4733 Obstructive sleep apnea (adult) (pediatric): Secondary | ICD-10-CM | POA: Insufficient documentation

## 2017-02-24 DIAGNOSIS — F17210 Nicotine dependence, cigarettes, uncomplicated: Secondary | ICD-10-CM | POA: Diagnosis not present

## 2017-02-24 DIAGNOSIS — I509 Heart failure, unspecified: Secondary | ICD-10-CM | POA: Insufficient documentation

## 2017-02-24 DIAGNOSIS — Z86010 Personal history of colonic polyps: Secondary | ICD-10-CM | POA: Insufficient documentation

## 2017-02-24 DIAGNOSIS — J449 Chronic obstructive pulmonary disease, unspecified: Secondary | ICD-10-CM | POA: Insufficient documentation

## 2017-02-24 HISTORY — PX: COLONOSCOPY WITH PROPOFOL: SHX5780

## 2017-02-24 SURGERY — COLONOSCOPY WITH PROPOFOL
Anesthesia: General

## 2017-02-24 MED ORDER — PROPOFOL 500 MG/50ML IV EMUL
INTRAVENOUS | Status: DC | PRN
  Administered 2017-02-24: 140 ug/kg/min via INTRAVENOUS

## 2017-02-24 MED ORDER — SODIUM CHLORIDE 0.9 % IV SOLN
INTRAVENOUS | Status: DC
  Administered 2017-02-24: 1000 mL via INTRAVENOUS

## 2017-02-24 MED ORDER — PROPOFOL 10 MG/ML IV BOLUS
INTRAVENOUS | Status: DC | PRN
  Administered 2017-02-24: 100 mg via INTRAVENOUS

## 2017-02-24 MED ORDER — LIDOCAINE HCL (CARDIAC) 20 MG/ML IV SOLN
INTRAVENOUS | Status: DC | PRN
  Administered 2017-02-24: 50 mg via INTRAVENOUS

## 2017-02-24 MED ORDER — PROPOFOL 500 MG/50ML IV EMUL
INTRAVENOUS | Status: AC
  Filled 2017-02-24: qty 50

## 2017-02-24 MED ORDER — LIDOCAINE HCL (PF) 2 % IJ SOLN
INTRAMUSCULAR | Status: AC
  Filled 2017-02-24: qty 10

## 2017-02-24 NOTE — Transfer of Care (Signed)
Immediate Anesthesia Transfer of Care Note  Patient: Caleb [name]  Procedure(s) Performed: COLONOSCOPY WITH PROPOFOL (N/A )  Patient Location: PACU and Endoscopy Unit  Anesthesia Type:General  Level of Consciousness: drowsy  Airway & Oxygen Therapy: Patient Spontanous Breathing and Patient connected to nasal cannula oxygen  Post-op Assessment: Report given to RN and Post -op Vital signs reviewed and stable  Post vital signs: Reviewed and stable  Last Vitals:  Vitals:   02/24/17 1007  BP: 131/84  Pulse: 70  Resp: 20  Temp: (!) 35.4 C  SpO2: 96%    Last Pain:  Vitals:   02/24/17 1007  TempSrc: Tympanic  PainSc: 8          Complications: No apparent anesthesia complications

## 2017-02-24 NOTE — Anesthesia Postprocedure Evaluation (Signed)
Anesthesia Post Note  Patient: Caleb [name]  Procedure(s) Performed: COLONOSCOPY WITH PROPOFOL (N/A )  Patient location during evaluation: Endoscopy Anesthesia Type: General Level of consciousness: awake and alert Pain management: pain level controlled Vital Signs Assessment: post-procedure vital signs reviewed and stable Respiratory status: spontaneous breathing and respiratory function stable Cardiovascular status: stable Anesthetic complications: no     Last Vitals:  Vitals:   02/24/17 1007  BP: 131/84  Pulse: 70  Resp: 20  Temp: (!) 35.4 C  SpO2: 96%    Last Pain:  Vitals:   02/24/17 1007  TempSrc: Tympanic  PainSc: 8                  Caleb [name] K
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
## 2017-02-24 NOTE — Anesthesia Preprocedure Evaluation (Addendum)
Anesthesia Evaluation  Patient identified by MRN, date of birth, ID band Patient awake    Reviewed: Allergy & Precautions, NPO status , Patient's Chart, lab work & pertinent test results  History of Anesthesia Complications Negative for: history of anesthetic complications  Airway Mallampati: III       Dental  (+) Missing, Chipped   Pulmonary sleep apnea (not on CPAP at present) , COPD,  COPD inhaler, Current Smoker,           Cardiovascular hypertension, Pt. on medications + Past MI, + Cardiac Stents and +CHF (with MI, no problems since)  (-) dysrhythmias (-) Valvular Problems/Murmurs     Neuro/Psych neg Seizures    GI/Hepatic Neg liver ROS, neg GERD  ,  Endo/Other  neg diabetes  Renal/GU negative Renal ROS     Musculoskeletal   Abdominal   Peds  Hematology   Anesthesia Other Findings   Reproductive/Obstetrics                             Anesthesia Physical Anesthesia Plan  ASA: III  Anesthesia Plan: General   Post-op Pain Management:    Induction: Intravenous  PONV Risk Score and Plan: 1 and Propofol infusion and TIVA  Airway Management Planned: Nasal Cannula  Additional Equipment:   Intra-op Plan:   Post-operative Plan:   Informed Consent: I have reviewed the patients History and Physical, chart, labs and discussed the procedure including the risks, benefits and alternatives for the proposed anesthesia with the patient or authorized representative who has indicated his/her understanding and acceptance.     Plan Discussed with:   Anesthesia Plan Comments:         Anesthesia Quick Evaluation

## 2017-02-24 NOTE — Op Note (Signed)
[facility] Patient Name: Caleb [name] Procedure Date: 02/24/2017 11:36 AM MRN: [id number] Account #: [id number] Date of Birth: 03-May-1955 Admit Type: Outpatient Age: 62 Room: [facility] ENDO ROOM 3 Gender: Male Note Status: Finalized Procedure:            Colonoscopy Indications:          Colon cancer screening in patient at increased risk:                        Family history of 1st-degree relative with colon                        polyps, High risk colon cancer surveillance: Personal                        history of colonic polyps Providers:            Jonathon Bellows MD, MD Referring MD:         Dionne Bucy. Bacigalupo (Referring MD) Medicines:            Monitored Anesthesia Care Complications:        No immediate complications. Procedure:            Pre-Anesthesia Assessment:                       - Prior to the procedure, a History and Physical was                        performed, and patient medications, allergies and                        sensitivities were reviewed. The patient's tolerance of                        previous anesthesia was reviewed.                       - The risks and benefits of the procedure and the                        sedation options and risks were discussed with the                        patient. All questions were answered and informed                        consent was obtained.                       - ASA Grade Assessment: III - A patient with severe                        systemic disease.                       After obtaining informed consent, the colonoscope was                        passed under direct vision. Throughout the procedure,  the patient's blood pressure, pulse, and oxygen                        saturations were monitored continuously. The                        Colonoscope was introduced through the anus and                        advanced to the the cecum, identified by the                         appendiceal orifice, IC valve and transillumination.                        The colonoscopy was performed with ease. The patient                        tolerated the procedure well. The quality of the bowel                        preparation was good. Findings:      The entire examined colon appeared normal on direct and retroflexion       views. Impression:           - The entire examined colon is normal on direct and                        retroflexion views.                       - No specimens collected. Recommendation:       - Discharge patient to home (with escort).                       - Resume previous diet.                       - Continue present medications.                       - Repeat colonoscopy in 5 years for surveillance. Procedure Code(s):    --- Professional ---                       [phone], Colonoscopy, flexible; diagnostic, including                        collection of specimen(s) by brushing or washing, when                        performed (separate procedure) Diagnosis Code(s):    --- Professional ---                       Z83.71, Family history of colonic polyps                       Z86.010, Personal history of colonic polyps CPT copyright 2016 [employer]. All rights reserved. The codes documented in this report are preliminary and upon coder review may  be revised to meet current compliance requirements. Jonathon Bellows, MD Jonathon Bellows MD, MD 02/24/2017  11:50:56 AM This report has been signed electronically. Number of Addenda: 0 Note Initiated On: 02/24/2017 11:36 AM Scope Withdrawal Time: 0 hours 7 minutes 0 seconds  Total Procedure Duration: 0 hours 8 minutes 14 seconds       [facility]

## 2017-02-24 NOTE — Anesthesia Post-op Follow-up Note (Signed)
Anesthesia QCDR form completed.        

## 2017-02-25 ENCOUNTER — Other Ambulatory Visit: Payer: Self-pay | Admitting: Family Medicine

## 2017-02-28 ENCOUNTER — Ambulatory Visit
Admission: RE | Admit: 2017-02-28 | Discharge: 2017-02-28 | Disposition: A | Discharge status: Home / Self Care | Payer: Medicaid Other | Source: Ambulatory Visit | Attending: Pain Medicine | Admitting: Pain Medicine

## 2017-02-28 ENCOUNTER — Other Ambulatory Visit: Payer: Self-pay

## 2017-02-28 ENCOUNTER — Ambulatory Visit: Payer: Medicaid Other | Attending: Pain Medicine | Admitting: Pain Medicine

## 2017-02-28 ENCOUNTER — Encounter: Payer: Self-pay | Admitting: Pain Medicine

## 2017-02-28 VITALS — BP 128/85 | HR 73 | Temp 98.2°F | Resp 18 | Ht 68.0 in | Wt 200.0 lb

## 2017-02-28 DIAGNOSIS — E785 Hyperlipidemia, unspecified: Secondary | ICD-10-CM | POA: Insufficient documentation

## 2017-02-28 DIAGNOSIS — Z833 Family history of diabetes mellitus: Secondary | ICD-10-CM | POA: Insufficient documentation

## 2017-02-28 DIAGNOSIS — R7982 Elevated C-reactive protein (CRP): Secondary | ICD-10-CM | POA: Insufficient documentation

## 2017-02-28 DIAGNOSIS — I252 Old myocardial infarction: Secondary | ICD-10-CM | POA: Diagnosis not present

## 2017-02-28 DIAGNOSIS — M25571 Pain in right ankle and joints of right foot: Secondary | ICD-10-CM

## 2017-02-28 DIAGNOSIS — M50322 Other cervical disc degeneration at C5-C6 level: Secondary | ICD-10-CM | POA: Insufficient documentation

## 2017-02-28 DIAGNOSIS — L400 Psoriasis vulgaris: Secondary | ICD-10-CM | POA: Insufficient documentation

## 2017-02-28 DIAGNOSIS — M4802 Spinal stenosis, cervical region: Secondary | ICD-10-CM | POA: Insufficient documentation

## 2017-02-28 DIAGNOSIS — M5030 Other cervical disc degeneration, unspecified cervical region: Secondary | ICD-10-CM | POA: Diagnosis not present

## 2017-02-28 DIAGNOSIS — I2510 Atherosclerotic heart disease of native coronary artery without angina pectoris: Secondary | ICD-10-CM | POA: Insufficient documentation

## 2017-02-28 DIAGNOSIS — M7918 Myalgia, other site: Secondary | ICD-10-CM | POA: Insufficient documentation

## 2017-02-28 DIAGNOSIS — R700 Elevated erythrocyte sedimentation rate: Secondary | ICD-10-CM | POA: Insufficient documentation

## 2017-02-28 DIAGNOSIS — M792 Neuralgia and neuritis, unspecified: Secondary | ICD-10-CM | POA: Insufficient documentation

## 2017-02-28 DIAGNOSIS — G4733 Obstructive sleep apnea (adult) (pediatric): Secondary | ICD-10-CM | POA: Insufficient documentation

## 2017-02-28 DIAGNOSIS — G8929 Other chronic pain: Secondary | ICD-10-CM

## 2017-02-28 DIAGNOSIS — M19071 Primary osteoarthritis, right ankle and foot: Secondary | ICD-10-CM | POA: Diagnosis not present

## 2017-02-28 DIAGNOSIS — M4803 Spinal stenosis, cervicothoracic region: Secondary | ICD-10-CM | POA: Insufficient documentation

## 2017-02-28 DIAGNOSIS — G894 Chronic pain syndrome: Secondary | ICD-10-CM

## 2017-02-28 DIAGNOSIS — F17210 Nicotine dependence, cigarettes, uncomplicated: Secondary | ICD-10-CM | POA: Diagnosis not present

## 2017-02-28 DIAGNOSIS — Z807 Family history of other malignant neoplasms of lymphoid, hematopoietic and related tissues: Secondary | ICD-10-CM | POA: Insufficient documentation

## 2017-02-28 DIAGNOSIS — I219 Acute myocardial infarction, unspecified: Secondary | ICD-10-CM | POA: Insufficient documentation

## 2017-02-28 DIAGNOSIS — R0789 Other chest pain: Secondary | ICD-10-CM | POA: Insufficient documentation

## 2017-02-28 DIAGNOSIS — M546 Pain in thoracic spine: Secondary | ICD-10-CM | POA: Diagnosis not present

## 2017-02-28 DIAGNOSIS — Z7982 Long term (current) use of aspirin: Secondary | ICD-10-CM | POA: Diagnosis not present

## 2017-02-28 DIAGNOSIS — G5791 Unspecified mononeuropathy of right lower limb: Secondary | ICD-10-CM | POA: Diagnosis not present

## 2017-02-28 DIAGNOSIS — M47813 Spondylosis without myelopathy or radiculopathy, cervicothoracic region: Secondary | ICD-10-CM | POA: Diagnosis present

## 2017-02-28 DIAGNOSIS — M542 Cervicalgia: Secondary | ICD-10-CM

## 2017-02-28 DIAGNOSIS — Z8249 Family history of ischemic heart disease and other diseases of the circulatory system: Secondary | ICD-10-CM | POA: Insufficient documentation

## 2017-02-28 DIAGNOSIS — Z9889 Other specified postprocedural states: Secondary | ICD-10-CM | POA: Diagnosis not present

## 2017-02-28 DIAGNOSIS — M47819 Spondylosis without myelopathy or radiculopathy, site unspecified: Secondary | ICD-10-CM | POA: Insufficient documentation

## 2017-02-28 DIAGNOSIS — J449 Chronic obstructive pulmonary disease, unspecified: Secondary | ICD-10-CM | POA: Diagnosis not present

## 2017-02-28 DIAGNOSIS — Z79891 Long term (current) use of opiate analgesic: Secondary | ICD-10-CM | POA: Diagnosis not present

## 2017-02-28 DIAGNOSIS — M899 Disorder of bone, unspecified: Secondary | ICD-10-CM

## 2017-02-28 DIAGNOSIS — M79671 Pain in right foot: Secondary | ICD-10-CM | POA: Diagnosis not present

## 2017-02-28 DIAGNOSIS — I10 Essential (primary) hypertension: Secondary | ICD-10-CM | POA: Insufficient documentation

## 2017-02-28 DIAGNOSIS — Z79899 Other long term (current) drug therapy: Secondary | ICD-10-CM | POA: Insufficient documentation

## 2017-02-28 DIAGNOSIS — M9981 Other biomechanical lesions of cervical region: Secondary | ICD-10-CM

## 2017-02-28 DIAGNOSIS — Z789 Other specified health status: Secondary | ICD-10-CM

## 2017-02-28 DIAGNOSIS — N529 Male erectile dysfunction, unspecified: Secondary | ICD-10-CM | POA: Insufficient documentation

## 2017-02-28 DIAGNOSIS — M5134 Other intervertebral disc degeneration, thoracic region: Secondary | ICD-10-CM

## 2017-02-28 MED ORDER — CYCLOBENZAPRINE HCL 10 MG PO TABS: 10.0000 mg | ORAL_TABLET | Freq: Every day | ORAL | 0 refills | Status: DC

## 2017-02-28 MED ORDER — TRAMADOL HCL 50 MG PO TABS: 50.0000 mg | ORAL_TABLET | Freq: Two times a day (BID) | ORAL | 0 refills | Status: DC

## 2017-02-28 MED ORDER — GABAPENTIN 300 MG PO CAPS: 600.0000 mg | ORAL_CAPSULE | Freq: Four times a day (QID) | ORAL | 0 refills | Status: DC

## 2017-02-28 NOTE — Progress Notes (Signed)
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 Increased incidence by 136%  >40 kg/m2 Extreme obesity (Class III) Increased incidence by 254%   BMI Readings from Last 4 Encounters:  02/28/17 30.41 kg/m  02/24/17 30.41 kg/m  02/17/17 30.41 kg/m  02/10/17 30.52 kg/m   Wt Readings from Last 4 Encounters:  02/28/17 200 lb (90.7 kg)  02/24/17 200 lb (90.7 kg)  02/17/17 200 lb (90.7 kg)  02/10/17 200 lb 12 oz (91.1 kg)  Psych/Mental status: Alert, oriented x 3 (person, place, & time)       Eyes: PERLA Respiratory: No evidence of acute respiratory distress  Cervical Spine Area Exam  Skin & Axial Inspection: No masses, redness, edema, swelling, or associated skin lesions Alignment: Symmetrical Functional ROM: Unrestricted ROM      Stability: No instability detected Muscle Tone/Strength: Functionally intact. No obvious neuro-muscular anomalies detected. Sensory  (Neurological): Unimpaired Palpation: No palpable anomalies              Upper Extremity (UE) Exam    Side: Right upper extremity  Side: Left upper extremity  Skin & Extremity Inspection: Skin color, temperature, and hair growth are WNL. No peripheral edema or cyanosis. No masses, redness, swelling, asymmetry, or associated skin lesions. No contractures.  Skin & Extremity Inspection: Skin color, temperature, and hair growth are WNL. No peripheral edema or cyanosis. No masses, redness, swelling, asymmetry, or associated skin lesions. No contractures.  Functional ROM: Unrestricted ROM          Functional ROM: Unrestricted ROM          Muscle Tone/Strength: Functionally intact. No obvious neuro-muscular anomalies detected.  Muscle Tone/Strength: Functionally intact. No obvious neuro-muscular anomalies detected.  Sensory (Neurological): Unimpaired          Sensory (Neurological): Unimpaired          Palpation: No palpable anomalies              Palpation: No palpable anomalies              Specialized Test(s): Deferred         Specialized Test(s): Deferred          Thoracic Spine Area Exam  Skin & Axial Inspection: No masses, redness, or swelling Alignment: Symmetrical Functional ROM: Unrestricted ROM Stability: No instability detected Muscle Tone/Strength: Functionally intact. No obvious neuro-muscular anomalies detected. Sensory (Neurological): Unimpaired Muscle strength & Tone: No palpable anomalies  Lumbar Spine Area Exam  Skin & Axial Inspection: No masses, redness, or swelling Alignment: Symmetrical Functional ROM: Unrestricted ROM      Stability: No instability detected Muscle Tone/Strength: Functionally intact. No obvious neuro-muscular anomalies detected. Sensory (Neurological): Unimpaired Palpation: No palpable anomalies       Provocative Tests: Lumbar Hyperextension and rotation test: evaluation deferred today       Lumbar Lateral bending test: evaluation deferred today  Patrick's Maneuver: evaluation deferred today                    Gait & Posture Assessment  Ambulation: Unassisted Gait: Relatively normal for age and body habitus Posture: WNL   Lower Extremity Exam    Side: Right lower extremity  Side: Left lower extremity  Skin & Extremity Inspection: Skin color, temperature, and hair growth are WNL. No peripheral edema or cyanosis. No masses, redness, swelling, asymmetry, or associated skin lesions. No contractures.  Skin & Extremity Inspection: Skin color, temperature, and hair growth are WNL. No peripheral edema or cyanosis. No masses, redness, swelling, asymmetry, or associated skin lesions. No contractures.  Functional ROM: Unrestricted ROM          Functional ROM: Unrestricted ROM          Muscle Tone/Strength: Functionally intact. No obvious neuro-muscular anomalies detected.  Muscle Tone/Strength: Functionally intact. No obvious neuro-muscular anomalies detected.  Sensory (Neurological): Unimpaired  Sensory (Neurological): Unimpaired  Palpation: No palpable anomalies  Palpation: No palpable anomalies   Assessment & Plan  Primary Diagnosis & Pertinent Problem List: The primary encounter diagnosis was Chronic pain syndrome. 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  Visit Diagnosis: 1. Chronic pain syndrome   2. Chronic  foot pain, right (Primary Area of Pain)   3. Neurogenic pain of right foot   4. Chronic pain of right ankle (Secondary Area of Pain)   5. Chronic midline thoracic back pain [facility])   6. Cervicalgia   7. Spondylosis without myelopathy or radiculopathy, cervicothoracic region   8. DDD (degenerative disc disease), cervical   9. 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 Chronic musculoskeletal pain   11. Disorder of skeletal system   12. Pharmacologic therapy   13. 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)   14. Elevated C-reactive protein (CRP)   15. Elevated sed rate   16. Problems influencing health status   17. DDD (degenerative disc disease), thoracic    Problems updated and reviewed during this visit: Problem  Ddd (Degenerative Disc Disease), Thoracic  Chronic ankle pain (Secondary Area of Pain) (Right)    Plan of Care  Pharmacotherapy (Medications Ordered): Meds ordered this encounter  Medications  . gabapentin (NEURONTIN) 300 MG capsule    Sig: Take 2 capsules (600 mg total) by mouth 4 (four) times daily.    Dispense:  240 capsule    Refill:  0    Do not place medication on "Automatic Refill". Fill one day early if pharmacy is closed on scheduled refill date.  . cyclobenzaprine (FLEXERIL) 10 MG tablet    Sig: Take 1 tablet (10 mg total) by mouth at bedtime.    Dispense:  30 tablet    Refill:  0    Do not place medication on "Automatic Refill". Fill one day early if pharmacy is closed on scheduled refill date.  . traMADol (ULTRAM) 50 MG tablet    Sig: Take 1 tablet (50 mg total)  by mouth 2 (two) times daily.    Dispense:  60 tablet    Refill:  0    Fill one day early if pharmacy is closed on scheduled refill date. Do not fill until: 02/28/17 To last until: 03/30/17   Procedure Orders    No procedure(s) ordered today    Lab Orders     Rheumatoid factor     ANA w/Reflex if Positive     Testosterone, Free, Total, SHBG  Imaging  Orders     DG Cervical Spine With Flex & Extend     MR CERVICAL SPINE WO CONTRAST Referral Orders  No referral(s) requested today   Pharmacological management options:  Opioid Analgesics: We'll take over management today. See above orders Membrane stabilizer: We have discussed the possibility of optimizing this mode of therapy, if tolerated Muscle relaxant: We have discussed the possibility of a trial NSAID: We have discussed the possibility of a trial Other analgesic(s): To be determined at a later time   Interventional management options: Planned, scheduled, and/or pending:    None at this time.   Considering:   Diagnostic right ankle block  Diagnostic right lumbar sympathetic block  Diagnostic midline thoracic epidural steroid injection  Diagnostic bilateral thoracic facet block  Possible bilateral thoracic facet RFA  Diagnostic cervical epidural steroid injection  Diagnostic bilateral cervical facet block  Possible bilateral cervical facet RFA    PRN Procedures:   None at this time   Provider-requested follow-up: Return in about 3 weeks (around 03/21/2017) for F/U eval (after test completion), Med-Mgmt, w/ Dr. Dossie Arbour.  Future Appointments  Date Time Provider [place]  03/03/2017  1:30 PM Virginia Crews, MD [facility] None  03/21/2017  9:30 AM Milinda Pointer, MD [facility] None  04/07/2017  8:45 AM [facility] None    Primary Care Physician: Virginia Crews, MD Location: [facility] Note by: Gaspar Cola, MD Date: 02/28/2017; Time: 11:17 AM

## 2017-02-28 NOTE — Progress Notes (Signed)
Safety precautions to be maintained throughout the outpatient stay will include: orient to surroundings, keep bed in low position, maintain call bell within reach at all times, provide assistance with transfer out of bed and ambulation.  

## 2017-02-28 NOTE — Patient Instructions (Signed)
____________________________________________________________________________________________  Pain Scale  Introduction: The pain score used by this practice is the Verbal Numerical Rating Scale (VNRS-11). This is an 11-point scale. It is for adults and children 10 years or older. There are significant differences in how the pain score is reported, used, and applied. Forget everything you learned in the past and learn this scoring system.  General Information: The scale should reflect your current level of pain. Unless you are specifically asked for the level of your worst pain, or your average pain. If you are asked for one of these two, then it should be understood that it is over the past 24 hours.  Basic Activities of Daily Living (ADL): Personal hygiene, dressing, eating, transferring, and using restroom.  Instructions: Most patients tend to report their level of pain as a combination of two factors, their physical pain and their psychosocial pain. This last one is also known as "suffering" and it is reflection of how physical pain affects you socially and psychologically. From now on, report them separately. From this point on, when asked to report your pain level, report only your physical pain. Use the following table for reference.  Pain Clinic Pain Levels (0-5/10)  Pain Level Score  Description  No Pain 0   Mild pain 1 Nagging, annoying, but does not interfere with basic activities of daily living (ADL). Patients are able to eat, bathe, get dressed, toileting (being able to get on and off the toilet and perform personal hygiene functions), transfer (move in and out of bed or a chair without assistance), and maintain continence (able to control bladder and bowel functions). Blood pressure and heart rate are unaffected. A normal heart rate for a healthy adult ranges from 60 to 100 bpm (beats per minute).   Mild to moderate pain 2 Noticeable and distracting. Impossible to hide from other  people. More frequent flare-ups. Still possible to adapt and function close to normal. It can be very annoying and may have occasional stronger flare-ups. With discipline, patients may get used to it and adapt.   Moderate pain 3 Interferes significantly with activities of daily living (ADL). It becomes difficult to feed, bathe, get dressed, get on and off the toilet or to perform personal hygiene functions. Difficult to get in and out of bed or a chair without assistance. Very distracting. With effort, it can be ignored when deeply involved in activities.   Moderately severe pain 4 Impossible to ignore for more than a few minutes. With effort, patients may still be able to manage work or participate in some social activities. Very difficult to concentrate. Signs of autonomic nervous system discharge are evident: dilated pupils (mydriasis); mild sweating (diaphoresis); sleep interference. Heart rate becomes elevated (>115 bpm). Diastolic blood pressure (lower number) rises above 100 mmHg. Patients find relief in laying down and not moving.   Severe pain 5 Intense and extremely unpleasant. Associated with frowning face and frequent crying. Pain overwhelms the senses.  Ability to do any activity or maintain social relationships becomes significantly limited. Conversation becomes difficult. Pacing back and forth is common, as getting into a comfortable position is nearly impossible. Pain wakes you up from deep sleep. Physical signs will be obvious: pupillary dilation; increased sweating; goosebumps; brisk reflexes; cold, clammy hands and feet; nausea, vomiting or dry heaves; loss of appetite; significant sleep disturbance with inability to fall asleep or to remain asleep. When persistent, significant weight loss is observed due to the complete loss of appetite and sleep deprivation.  Blood   pressure and heart rate becomes significantly elevated. Caution: If elevated blood pressure triggers a pounding headache  associated with blurred vision, then the patient should immediately seek attention at an urgent or emergency care unit, as these may be signs of an impending stroke.    Emergency Department Pain Levels (6-10/10)  Emergency Room Pain 6 Severely limiting. Requires emergency care and should not be seen or managed at an outpatient pain management facility. Communication becomes difficult and requires great effort. Assistance to reach the emergency department may be required. Facial flushing and profuse sweating along with potentially dangerous increases in heart rate and blood pressure will be evident.   Distressing pain 7 Self-care is very difficult. Assistance is required to transport, or use restroom. Assistance to reach the emergency department will be required. Tasks requiring coordination, such as bathing and getting dressed become very difficult.   Disabling pain 8 Self-care is no longer possible. At this level, pain is disabling. The individual is unable to do even the most "basic" activities such as walking, eating, bathing, dressing, transferring to a bed, or toileting. Fine motor skills are lost. It is difficult to think clearly.   Incapacitating pain 9 Pain becomes incapacitating. Thought processing is no longer possible. Difficult to remember your own name. Control of movement and coordination are lost.   The worst pain imaginable 10 At this level, most patients pass out from pain. When this level is reached, collapse of the autonomic nervous system occurs, leading to a sudden drop in blood pressure and heart rate. This in turn results in a temporary and dramatic drop in blood flow to the brain, leading to a loss of consciousness. Fainting is one of the body's self defense mechanisms. Passing out puts the brain in a calmed state and causes it to shut down for a while, in order to begin the healing process.    Summary: 1. Refer to this scale when providing [place] with your pain level. 2. Be  accurate and careful when reporting your pain level. This will help with your care. 3. Over-reporting your pain level will lead to loss of credibility. 4. Even a level of 1/10 means that there is pain and will be treated at our facility. 5. High, inaccurate reporting will be documented as "Symptom Exaggeration", leading to loss of credibility and suspicions of possible secondary gains such as obtaining more narcotics, or wanting to appear disabled, for fraudulent reasons. 6. Only pain levels of 5 or below will be seen at our facility. 7. Pain levels of 6 and above will be sent to the Emergency Department and the appointment cancelled. ____________________________________________________________________________________________   ____________________________________________________________________________________________  Medication Rules  Applies to: All patients receiving prescriptions (written or electronic).  Pharmacy of record: Pharmacy where electronic prescriptions will be sent. If written prescriptions are taken to a different pharmacy, please inform the nursing staff. The pharmacy listed in the electronic medical record should be the one where you would like electronic prescriptions to be sent.  Prescription refills: Only during scheduled appointments. Applies to both, written and electronic prescriptions.  NOTE: The following applies primarily to controlled substances (Opioid* Pain Medications).   Patient's responsibilities: 1. Pain Pills: Bring all pain pills to every appointment (except for procedure appointments). 2. Pill Bottles: Bring pills in original pharmacy bottle. Always bring newest bottle. Bring bottle, even if empty. 3. Medication refills: You are responsible for knowing and keeping track of what medications you need refilled. The day before your appointment, write a list of all  prescriptions that need to be refilled. Bring that list to your appointment and give it to the  admitting nurse. Prescriptions will be written only during appointments. If you forget a medication, it will not be "Called in", "Faxed", or "electronically sent". You will need to get another appointment to get these prescribed. 4. Prescription Accuracy: You are responsible for carefully inspecting your prescriptions before leaving our office. Have the discharge nurse carefully go over each prescription with you, before taking them home. Make sure that your name is accurately spelled, that your address is correct. Check the name and dose of your medication to make sure it is accurate. Check the number of pills, and the written instructions to make sure they are clear and accurate. Make sure that you are given enough medication to last until your next medication refill appointment. 5. Taking Medication: Take medication as prescribed. Never take more pills than instructed. Never take medication more frequently than prescribed. Taking less pills or less frequently is permitted and encouraged, when it comes to controlled substances (written prescriptions).  6. Inform other Doctors: Always inform, all of your healthcare providers, of all the medications you take. 7. Pain Medication from other Providers: You are not allowed to accept any additional pain medication from any other Doctor or Healthcare provider. There are two exceptions to this rule. (see below) In the event that you require additional pain medication, you are responsible for notifying us, as stated below. 8. Medication Agreement: You are responsible for carefully reading and following our Medication Agreement. This must be signed before receiving any prescriptions from our practice. Safely store a copy of your signed Agreement. Violations to the Agreement will result in no further prescriptions. (Additional copies of our Medication Agreement are available upon request.) 9. Laws, Rules, & Regulations: All patients are expected to follow all Federal  and [employer], [employer], Rules, [employer]. Ignorance of the Laws does not constitute a valid excuse. The use of any illegal substances is prohibited. 10. Adopted CDC guidelines & recommendations: Target dosing levels will be at or below 60 MME/day. Use of benzodiazepines** is not recommended.  Exceptions: There are only two exceptions to the rule of not receiving pain medications from other Healthcare Providers. 1. Exception #1 (Emergencies): In the event of an emergency (i.e.: accident requiring emergency care), you are allowed to receive additional pain medication. However, you are responsible for: As soon as you are able, call our office [phone], at any time of the day or night, and leave a message stating your name, the date and nature of the emergency, and the name and dose of the medication prescribed. In the event that your call is answered by a member of our staff, make sure to document and save the date, time, and the name of the person that took your information.  2. Exception #2 (Planned Surgery): In the event that you are scheduled by another doctor or dentist to have any type of surgery or procedure, you are allowed (for a period no longer than 30 days), to receive additional pain medication, for the acute post-op pain. However, in this case, you are responsible for picking up a copy of our "Post-op Pain Management for Surgeons" handout, and giving it to your surgeon or dentist. This document is available at our office, and does not require an appointment to obtain it. Simply go to our office during business hours (Monday-Thursday from 8:00 AM to 4:00 PM) (Friday 8:00 AM to 12:00 Noon) or if you  have a scheduled appointment with [place], prior to your surgery, and ask for it by name. In addition, you will need to provide [place] with your name, name of your surgeon, type of surgery, and date of procedure or surgery.  *Opioid medications include: morphine, codeine, oxycodone, oxymorphone,  hydrocodone, hydromorphone, meperidine, tramadol, tapentadol, buprenorphine, fentanyl, methadone. **Benzodiazepine medications include: diazepam (Valium), alprazolam (Xanax), clonazepam (Klonopine), lorazepam (Ativan), clorazepate (Tranxene), chlordiazepoxide (Librium), estazolam (Prosom), oxazepam (Serax), temazepam (Restoril), triazolam (Halcion) ____________________________________________________________________________________________ ____________________________________________________________________________________________  Medication Recommendations and Reminders  Applies to: All patients receiving prescriptions (written and/or electronic).  Medication Rules & Regulations: These rules and regulations exist for your safety and that of others. They are not flexible and neither are we. Dismissing or ignoring them will be considered "non-compliance" with medication therapy, resulting in complete and irreversible termination of such therapy. (See document titled "Medication Rules" for more details.) In all conscience, because of safety reasons, we cannot continue providing a therapy where the patient does not follow instructions.  Pharmacy of record:   Definition: This is the pharmacy where your electronic prescriptions will be sent.   We do not endorse any particular pharmacy.  You are not restricted in your choice of pharmacy.  The pharmacy listed in the electronic medical record should be the one where you want electronic prescriptions to be sent.  If you choose to change pharmacy, simply notify our nursing staff of your choice of new pharmacy.  Recommendations:  Keep all of your pain medications in a safe place, under lock and key, even if you live alone.   After you fill your prescription, take 1 week's worth of pills and put them away in a safe place. You should keep a separate, properly labeled bottle for this purpose. The remainder should be kept in the original bottle. Use  this as your primary supply, until it runs out. Once it's gone, then you know that you have 1 week's worth of medicine, and it is time to come in for a prescription refill. If you do this correctly, it is unlikely that you will ever run out of medicine.  To make sure that the above recommendation works, it is very important that you make sure your medication refill appointments are scheduled at least 1 week before you run out of medicine. To do this in an effective manner, make sure that you do not leave the office without scheduling your next medication management appointment. Always ask the nursing staff to show you in your prescription , when your medication will be running out. Then arrange for the receptionist to get you a return appointment, at least 7 days before you run out of medicine. Do not wait until you have 1 or 2 pills left, to come in. This is very poor planning and does not take into consideration that we may need to cancel appointments due to bad weather, sickness, or emergencies affecting our staff.  Prescription refills and/or changes in medication(s):   Prescription refills, and/or changes in dose or medication, will be conducted only during scheduled medication management appointments. (Applies to both, written and electronic prescriptions.)  No medication will be changed or started on procedure days. No changes, adjustments, and/or refills will be conducted on a procedure day. Doing so will interfere with the diagnostic portion of the procedure.  No phone refills. No medications will be "called into the pharmacy".  No Fax refills.  No weekend refills.  No Holliday refills.  No after hours refills.  Remember:  Business hours  are:  Monday to Thursday 8:00 AM to 4:00 PM Provider's Schedule: Dionisio David, NP - Appointments are:  Medication management: Monday to Thursday 8:00 AM to 4:00 PM Milinda Pointer, MD - Appointments are:  Medication management: Monday and  Wednesday 8:00 AM to 4:00 PM Procedures: Tuesday and Thursday 7:30 AM to 4:00 PM Gillis Santa, MD - Appointments are:  Medication management: Tuesday and Thursday 8:00 AM to 4:00 PM Procedures days: Monday and Wednesday 7:30 AM to 4:00 PM ____________________________________________________________________________________________

## 2017-03-01 ENCOUNTER — Telehealth: Payer: Self-pay

## 2017-03-01 DIAGNOSIS — M542 Cervicalgia: Secondary | ICD-10-CM | POA: Insufficient documentation

## 2017-03-01 DIAGNOSIS — M5030 Other cervical disc degeneration, unspecified cervical region: Secondary | ICD-10-CM | POA: Insufficient documentation

## 2017-03-01 DIAGNOSIS — M4802 Spinal stenosis, cervical region: Secondary | ICD-10-CM | POA: Insufficient documentation

## 2017-03-01 LAB — TESTOSTERONE, FREE, TOTAL, SHBG
Sex Hormone Binding: 41.8 nmol/L (ref 19.3–76.4)
Testosterone, Free: 5.2 pg/mL — ABNORMAL LOW (ref 7.2–24.0)
Testosterone: 279 ng/dL (ref 264–916)

## 2017-03-01 LAB — RHEUMATOID FACTOR: Rhuematoid fact SerPl-aCnc: <10 IU/mL (ref 0.0–13.9)

## 2017-03-01 LAB — ANA W/REFLEX IF POSITIVE: Anti Nuclear Antibody(ANA): NEGATIVE

## 2017-03-01 NOTE — Telephone Encounter (Signed)
Patient said he was supposed to let you know a medication he is on. He called and the medicined is [place] pen injection 40 mg each every other week for 5 months.  Also a cream he was getting had the ingrediants  ibuprofen 5% lidocain 2.2 5/5,  Prilocaine 2.25 % Tizanidine 0.2 %.Marland Kitchen

## 2017-03-02 DIAGNOSIS — M5134 Other intervertebral disc degeneration, thoracic region: Secondary | ICD-10-CM | POA: Insufficient documentation

## 2017-03-03 ENCOUNTER — Encounter: Payer: Self-pay | Admitting: Family Medicine

## 2017-03-03 ENCOUNTER — Ambulatory Visit (INDEPENDENT_AMBULATORY_CARE_PROVIDER_SITE_OTHER): Payer: Medicaid Other | Admitting: Family Medicine

## 2017-03-03 VITALS — BP 134/82 | HR 69 | Temp 97.5°F | Resp 16 | Wt 202.0 lb

## 2017-03-03 DIAGNOSIS — G4733 Obstructive sleep apnea (adult) (pediatric): Secondary | ICD-10-CM

## 2017-03-03 DIAGNOSIS — Z720 Tobacco use: Secondary | ICD-10-CM

## 2017-03-03 DIAGNOSIS — N644 Mastodynia: Secondary | ICD-10-CM | POA: Diagnosis not present

## 2017-03-03 NOTE — Patient Instructions (Signed)

## 2017-03-03 NOTE — Progress Notes (Signed)
Patient: Caleb [name] Male    DOB: 11-Sep-1955   62 y.o.   MRN: [id number] Visit Date: 03/03/2017  Today's Provider: Lavon Paganini, MD   Chief Complaint  Patient presents with  . Nicotine Dependence   Subjective:    HPI     Follow up for Smoking Cessation  The patient was last seen for this 4 weeks ago. Changes made at last visit include starting Chantix.  He reports good compliance with treatment. He feels that condition is Improved. He has cut back from 0.5-0.75 PPD to less than 0.5 PPD. He is having side effects. Dry Mouth and strange dreams. He strongly desires to quit after he talked to Cardiologist about a blockage in CAD.  ---------------------------------------------------------------------------------  Pt states he has a H/O sleep apnea. He was using a CPAP machine, with relief of sx until he had this machine adjusted. The adjustment caused the machine to be uncomfortable. He is now snoring, restless.  He turned the CPAP back in after it felt like it was choking him when he used it.  This was >1 yr ago. Last sleep study was >2 yrs ago in [place].  He is also c/o left pectoral pain. This has been present for about 6 months.  Hurts with palpation.  Going on for >1 yr.  No movements make it worse.  Not associated with SOB, diaphoresis. Maternal uncle had breast cancer at a young age (exact age unknown)  No Known Allergies   Current Outpatient Medications:  .  Adalimumab (HUMIRA PEN) 40 MG/0.4ML PNKT, Inject into the skin every 14 (fourteen) days., Disp: , Rfl:  .  albuterol (PROVENTIL HFA;VENTOLIN HFA) 108 (90 Base) MCG/ACT inhaler, Inhale 1-2 puffs every 6 (six) hours as needed into the lungs for wheezing or shortness of breath., Disp: 1 Inhaler, Rfl: 5 .  aspirin EC 81 MG tablet, Take 81 mg by mouth daily., Disp: , Rfl:  .  cyclobenzaprine (FLEXERIL) 10 MG tablet, Take 1 tablet (10 mg total) by mouth at bedtime., Disp: 30 tablet, Rfl: 0 .   fluticasone (FLOVENT HFA) 220 MCG/ACT inhaler, Inhale 1 puff 2 (two) times daily into the lungs., Disp: 1 Inhaler, Rfl: 5 .  gabapentin (NEURONTIN) 300 MG capsule, Take 2 capsules (600 mg total) by mouth 4 (four) times daily., Disp: 240 capsule, Rfl: 0 .  hydrochlorothiazide (HYDRODIURIL) 25 MG tablet, Take 1 tablet (25 mg total) by mouth daily., Disp: 90 tablet, Rfl: 3 .  isosorbide mononitrate (IMDUR) 30 MG 24 hr tablet, TAKE 0.5 TABLET(S) EVERY DAY BY ORAL ROUTE IN THE MORNING., Disp: 15 tablet, Rfl: 5 .  metoprolol tartrate (LOPRESSOR) 25 MG tablet, TAKE 1 TABLET BY MOUTH TWICE A DAY, Disp: 60 tablet, Rfl: 2 .  NONFORMULARY OR COMPOUNDED ITEM, , Disp: , Rfl:  .  rosuvastatin (CRESTOR) 40 MG tablet, TAKE 1 TABLET BY MOUTH EVERY DAY, Disp: 90 tablet, Rfl: 3 .  sildenafil (REVATIO) 20 MG tablet, Take 1 to 5 tabs PO daily prn, Disp: 30 tablet, Rfl: 6 .  tiotropium (SPIRIVA) 18 MCG inhalation capsule, Place 1 capsule (18 mcg total) daily into inhaler and inhale., Disp: 30 capsule, Rfl: 5 .  traMADol (ULTRAM) 50 MG tablet, Take 1 tablet (50 mg total) by mouth 2 (two) times daily., Disp: 60 tablet, Rfl: 0 .  triamcinolone ointment (KENALOG) 0.5 %, Apply 1 application 2 (two) times daily topically. On psoriasis lesions, Disp: 30 g, Rfl: 2 .  varenicline (CHANTIX CONTINUING MONTH  PAK) 1 MG tablet, Take 1 tablet (1 mg total) by mouth 2 (two) times daily., Disp: 60 tablet, Rfl: 3  Review of Systems  Constitutional: Positive for activity change and fatigue. Negative for appetite change, chills, diaphoresis, fever and unexpected weight change.  Respiratory: Negative for shortness of breath.   Cardiovascular: Negative for chest pain, palpitations and leg swelling.  Psychiatric/Behavioral: Positive for sleep disturbance.    Social History   Tobacco Use  . Smoking status: Current Every Day Smoker    Packs/day: 0.50    Years: 49.00    Pack years: 24.50    Types: Cigarettes  . Smokeless tobacco: Never  Used  . Tobacco comment: started smoking at age ; has decreased cigarette use from 2 PPD to 0.5 PPD  Substance Use Topics  . Alcohol use: Yes    Alcohol/week: 2.4 oz    Types: 4 Cans of beer per week   Objective:   BP 134/82 (BP Location: Left Arm, Patient Position: Sitting, Cuff Size: Large)   Pulse 69   Temp (!) 97.5 F (36.4 C) (Oral)   Resp 16   SpO2 95%  Vitals:   03/03/17 1327  BP: 134/82  Pulse: 69  Resp: 16  Temp: (!) 97.5 F (36.4 C)  TempSrc: Oral  SpO2: 95%     Physical Exam  Constitutional: He is oriented to person, place, and time. He appears well-developed and well-nourished. No distress.  HENT:  Head: Normocephalic and atraumatic.  Eyes: Conjunctivae are normal. No scleral icterus.  Cardiovascular: Normal rate, regular rhythm, normal heart sounds and intact distal pulses.  No murmur heard. Pulmonary/Chest: Effort normal and breath sounds normal. No respiratory distress. He has no wheezes. He has no rales.  Breasts: right breast normal without mass, skin or nipple changes or axillary nodes, left breast TTP over areola and surrounding tissue. Tissue is inflamed compared to R breast. No axillary nodes palpable.   Abdominal: Soft. He exhibits no distension. There is no tenderness.  Musculoskeletal: He exhibits no edema.  Neurological: He is alert and oriented to person, place, and time.  Skin: Skin is warm and dry. Rash noted.  Psychiatric: His behavior is normal.  Vitals reviewed.       Assessment & Plan:     Problem List Items Addressed This Visit      Respiratory   OSA (obstructive sleep apnea) - Primary    Untreated and no longer has a CPAP machine Will attempt to obtain sleep study with CPAP titration or auto-pap  Discussed importance of treating OSA, especially with known CAD and HTN      Relevant Orders   Ambulatory referral to Sleep Studies     Other   Tobacco abuse    Chronic, improving Encouraged patient on his cessation efforts and  cutting back on cigarettes Continue Chantix, as side effects are manageable F/u in 3 months      Pain of left breast    TTP and inflammation over L breast Family history of breast cancer in his maternal uncle Will obtain breast US including axilla Could consider mammogram pending results Will f/u sooner pending results.      Relevant Orders   US BREAST COMPLETE UNI LEFT INC AXILLA       Return in about 3 months (around 05/31/2017) for chronic disease f/u.   The entirety of the information documented in the History of Present Illness, Review of Systems and Physical Exam were personally obtained by me. Portions of this information  were initially documented by Martha Clan, CMA and reviewed by me for thoroughness and accuracy.    Virginia Crews, MD, MPH [facility] 03/03/2017 2:05 PM

## 2017-03-03 NOTE — Assessment & Plan Note (Signed)
TTP and inflammation over L breast Family history of breast cancer in his maternal uncle Will obtain breast US including axilla Could consider mammogram pending results Will f/u sooner pending results.

## 2017-03-03 NOTE — Assessment & Plan Note (Signed)
Untreated and no longer has a CPAP machine Will attempt to obtain sleep study with CPAP titration or auto-pap  Discussed importance of treating OSA, especially with known CAD and HTN

## 2017-03-03 NOTE — Assessment & Plan Note (Signed)
Chronic, improving Encouraged patient on his cessation efforts and cutting back on cigarettes Continue Chantix, as side effects are manageable F/u in 3 months

## 2017-03-04 ENCOUNTER — Telehealth: Payer: Self-pay | Admitting: *Deleted

## 2017-03-08 ENCOUNTER — Telehealth: Payer: Self-pay | Admitting: Family Medicine

## 2017-03-08 DIAGNOSIS — N644 Mastodynia: Secondary | ICD-10-CM

## 2017-03-08 NOTE — Telephone Encounter (Signed)
Ordered as requested.

## 2017-03-08 NOTE — Telephone Encounter (Signed)
Per scheduling department at [facility] pt will need an order for bilateral diagnostic mammogram TOMO.and left breast LIMITED left breast ultrasound.

## 2017-03-15 ENCOUNTER — Ambulatory Visit
Admission: RE | Admit: 2017-03-15 | Discharge: 2017-03-15 | Disposition: A | Discharge status: Home / Self Care | Payer: Medicaid Other | Source: Ambulatory Visit | Attending: Family Medicine | Admitting: Family Medicine

## 2017-03-15 ENCOUNTER — Ambulatory Visit: Admission: RE | Admit: 2017-03-15 | Payer: Medicaid Other | Source: Ambulatory Visit

## 2017-03-15 DIAGNOSIS — N644 Mastodynia: Secondary | ICD-10-CM | POA: Insufficient documentation

## 2017-03-15 DIAGNOSIS — N62 Hypertrophy of breast: Secondary | ICD-10-CM | POA: Diagnosis not present

## 2017-03-17 ENCOUNTER — Ambulatory Visit: Payer: Medicaid Other | Attending: Otolaryngology

## 2017-03-17 ENCOUNTER — Telehealth: Payer: Self-pay

## 2017-03-17 DIAGNOSIS — G4733 Obstructive sleep apnea (adult) (pediatric): Secondary | ICD-10-CM | POA: Diagnosis not present

## 2017-03-17 DIAGNOSIS — I10 Essential (primary) hypertension: Secondary | ICD-10-CM | POA: Diagnosis not present

## 2017-03-17 DIAGNOSIS — I252 Old myocardial infarction: Secondary | ICD-10-CM | POA: Insufficient documentation

## 2017-03-17 DIAGNOSIS — I491 Atrial premature depolarization: Secondary | ICD-10-CM | POA: Diagnosis not present

## 2017-03-17 DIAGNOSIS — R0683 Snoring: Secondary | ICD-10-CM | POA: Diagnosis not present

## 2017-03-17 NOTE — Telephone Encounter (Signed)
Pt advised. States he has a H/O low testosterone, and was advised that this could be the cause of the gynecomastia.

## 2017-03-17 NOTE — Telephone Encounter (Signed)
-----   Message from Virginia Crews, MD sent at 03/17/2017  9:49 AM EDT ----- Normal mammogram with benign findings.  In left breast there is gynecomastia (breast enlargement).  This can be caused by many different things.  We can discuss further at next visit.  Virginia Crews, MD, MPH [facility] 03/17/2017 9:49 AM

## 2017-03-17 NOTE — Telephone Encounter (Signed)
[email or web address]

## 2017-03-21 ENCOUNTER — Ambulatory Visit: Payer: Medicaid Other | Attending: Pain Medicine | Admitting: Pain Medicine

## 2017-03-21 ENCOUNTER — Encounter: Payer: Self-pay | Admitting: Pain Medicine

## 2017-03-21 ENCOUNTER — Other Ambulatory Visit: Payer: Self-pay

## 2017-03-21 VITALS — BP 138/84 | HR 83 | Temp 97.9°F | Resp 18 | Ht 68.0 in | Wt 200.0 lb

## 2017-03-21 DIAGNOSIS — M19071 Primary osteoarthritis, right ankle and foot: Secondary | ICD-10-CM | POA: Diagnosis present

## 2017-03-21 DIAGNOSIS — Z807 Family history of other malignant neoplasms of lymphoid, hematopoietic and related tissues: Secondary | ICD-10-CM | POA: Insufficient documentation

## 2017-03-21 DIAGNOSIS — M5134 Other intervertebral disc degeneration, thoracic region: Secondary | ICD-10-CM | POA: Diagnosis not present

## 2017-03-21 DIAGNOSIS — L400 Psoriasis vulgaris: Secondary | ICD-10-CM | POA: Diagnosis not present

## 2017-03-21 DIAGNOSIS — M9981 Other biomechanical lesions of cervical region: Secondary | ICD-10-CM | POA: Diagnosis not present

## 2017-03-21 DIAGNOSIS — I10 Essential (primary) hypertension: Secondary | ICD-10-CM | POA: Diagnosis not present

## 2017-03-21 DIAGNOSIS — J449 Chronic obstructive pulmonary disease, unspecified: Secondary | ICD-10-CM | POA: Insufficient documentation

## 2017-03-21 DIAGNOSIS — M25571 Pain in right ankle and joints of right foot: Secondary | ICD-10-CM

## 2017-03-21 DIAGNOSIS — I252 Old myocardial infarction: Secondary | ICD-10-CM | POA: Diagnosis not present

## 2017-03-21 DIAGNOSIS — N529 Male erectile dysfunction, unspecified: Secondary | ICD-10-CM | POA: Insufficient documentation

## 2017-03-21 DIAGNOSIS — M47819 Spondylosis without myelopathy or radiculopathy, site unspecified: Secondary | ICD-10-CM | POA: Diagnosis not present

## 2017-03-21 DIAGNOSIS — M542 Cervicalgia: Secondary | ICD-10-CM | POA: Diagnosis not present

## 2017-03-21 DIAGNOSIS — Z820 Family history of epilepsy and other diseases of the nervous system: Secondary | ICD-10-CM | POA: Insufficient documentation

## 2017-03-21 DIAGNOSIS — G894 Chronic pain syndrome: Secondary | ICD-10-CM

## 2017-03-21 DIAGNOSIS — R079 Chest pain, unspecified: Secondary | ICD-10-CM | POA: Insufficient documentation

## 2017-03-21 DIAGNOSIS — Z7982 Long term (current) use of aspirin: Secondary | ICD-10-CM | POA: Insufficient documentation

## 2017-03-21 DIAGNOSIS — M4803 Spinal stenosis, cervicothoracic region: Secondary | ICD-10-CM | POA: Diagnosis not present

## 2017-03-21 DIAGNOSIS — R700 Elevated erythrocyte sedimentation rate: Secondary | ICD-10-CM

## 2017-03-21 DIAGNOSIS — R7982 Elevated C-reactive protein (CRP): Secondary | ICD-10-CM

## 2017-03-21 DIAGNOSIS — Z9889 Other specified postprocedural states: Secondary | ICD-10-CM | POA: Insufficient documentation

## 2017-03-21 DIAGNOSIS — E785 Hyperlipidemia, unspecified: Secondary | ICD-10-CM | POA: Insufficient documentation

## 2017-03-21 DIAGNOSIS — M47813 Spondylosis without myelopathy or radiculopathy, cervicothoracic region: Secondary | ICD-10-CM | POA: Diagnosis not present

## 2017-03-21 DIAGNOSIS — I2510 Atherosclerotic heart disease of native coronary artery without angina pectoris: Secondary | ICD-10-CM | POA: Insufficient documentation

## 2017-03-21 DIAGNOSIS — Z720 Tobacco use: Secondary | ICD-10-CM

## 2017-03-21 DIAGNOSIS — M79671 Pain in right foot: Secondary | ICD-10-CM

## 2017-03-21 DIAGNOSIS — M5030 Other cervical disc degeneration, unspecified cervical region: Secondary | ICD-10-CM

## 2017-03-21 DIAGNOSIS — I219 Acute myocardial infarction, unspecified: Secondary | ICD-10-CM | POA: Diagnosis not present

## 2017-03-21 DIAGNOSIS — G4733 Obstructive sleep apnea (adult) (pediatric): Secondary | ICD-10-CM | POA: Insufficient documentation

## 2017-03-21 DIAGNOSIS — F17210 Nicotine dependence, cigarettes, uncomplicated: Secondary | ICD-10-CM | POA: Diagnosis not present

## 2017-03-21 DIAGNOSIS — G8929 Other chronic pain: Secondary | ICD-10-CM

## 2017-03-21 DIAGNOSIS — M546 Pain in thoracic spine: Secondary | ICD-10-CM | POA: Diagnosis not present

## 2017-03-21 DIAGNOSIS — M899 Disorder of bone, unspecified: Secondary | ICD-10-CM

## 2017-03-21 DIAGNOSIS — M50322 Other cervical disc degeneration at C5-C6 level: Secondary | ICD-10-CM | POA: Insufficient documentation

## 2017-03-21 DIAGNOSIS — G5791 Unspecified mononeuropathy of right lower limb: Secondary | ICD-10-CM

## 2017-03-21 DIAGNOSIS — Z79891 Long term (current) use of opiate analgesic: Secondary | ICD-10-CM

## 2017-03-21 DIAGNOSIS — M4802 Spinal stenosis, cervical region: Secondary | ICD-10-CM | POA: Diagnosis not present

## 2017-03-21 DIAGNOSIS — Z8249 Family history of ischemic heart disease and other diseases of the circulatory system: Secondary | ICD-10-CM | POA: Insufficient documentation

## 2017-03-21 DIAGNOSIS — Z9049 Acquired absence of other specified parts of digestive tract: Secondary | ICD-10-CM | POA: Insufficient documentation

## 2017-03-21 DIAGNOSIS — M7918 Myalgia, other site: Secondary | ICD-10-CM | POA: Diagnosis not present

## 2017-03-21 DIAGNOSIS — Z789 Other specified health status: Secondary | ICD-10-CM

## 2017-03-21 DIAGNOSIS — M792 Neuralgia and neuritis, unspecified: Secondary | ICD-10-CM

## 2017-03-21 DIAGNOSIS — Z79899 Other long term (current) drug therapy: Secondary | ICD-10-CM

## 2017-03-21 MED ORDER — TRAMADOL HCL 50 MG PO TABS: 50.0000 mg | ORAL_TABLET | Freq: Two times a day (BID) | ORAL | 2 refills | Status: DC

## 2017-03-21 MED ORDER — CYCLOBENZAPRINE HCL 10 MG PO TABS: 10.0000 mg | ORAL_TABLET | Freq: Every day | ORAL | 2 refills | Status: DC

## 2017-03-21 MED ORDER — NONFORMULARY OR COMPOUNDED ITEM
2 refills | Status: DC
Start: 2017-03-21 — End: 2017-04-13

## 2017-03-21 MED ORDER — GABAPENTIN 300 MG PO CAPS: 900.0000 mg | ORAL_CAPSULE | Freq: Four times a day (QID) | ORAL | 2 refills | Status: DC

## 2017-03-21 NOTE — Patient Instructions (Signed)

## 2017-03-21 NOTE — Progress Notes (Signed)
Nursing Pain Medication Assessment:  Safety precautions to be maintained throughout the outpatient stay will include: orient to surroundings, keep bed in low position, maintain call bell within reach at all times, provide assistance with transfer out of bed and ambulation.  Medication Inspection Compliance: Mr. Evers did not comply with our request to bring his pills to be counted. He was reminded that bringing the medication bottles, even when empty, is a requirement.  Medication: None brought in. Pill/Patch Count: None available to be counted. Bottle Appearance: No container available. Did not bring bottle(s) to appointment. Filled Date: N/A Last Medication intake:  Today 

## 2017-03-21 NOTE — Progress Notes (Signed)
Safety precautions to be maintained throughout the outpatient stay will include: orient to surroundings, keep bed in low position, maintain call bell within reach at all times, provide assistance with transfer out of bed and ambulation.  

## 2017-03-21 NOTE — Progress Notes (Signed)
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 Respiratory: No evidence of acute respiratory distress  Cervical Spine Area Exam  Skin & Axial Inspection: No masses, redness, edema, swelling, or associated skin lesions Alignment: Symmetrical Functional ROM: Unrestricted ROM      Stability: No instability detected Muscle Tone/Strength: Functionally intact. No obvious neuro-muscular anomalies detected. Sensory (Neurological): Unimpaired Palpation: No palpable anomalies              Upper Extremity (UE) Exam    Side: Right upper extremity  Side: Left upper extremity  Skin & Extremity Inspection: Skin color, temperature, and hair growth are WNL. No peripheral edema or cyanosis. No masses, redness, swelling, asymmetry, or associated skin lesions. No contractures.  Skin & Extremity Inspection: Skin color, temperature, and hair growth are WNL. No peripheral edema or cyanosis. No masses,  redness, swelling, asymmetry, or associated skin lesions. No contractures.  Functional ROM: Unrestricted ROM          Functional ROM: Unrestricted ROM          Muscle Tone/Strength: Functionally intact. No obvious neuro-muscular anomalies detected.  Muscle Tone/Strength: Functionally intact. No obvious neuro-muscular anomalies detected.  Sensory (Neurological): Unimpaired          Sensory (Neurological): Unimpaired          Palpation: No palpable anomalies              Palpation: No palpable anomalies              Specialized Test(s): Deferred         Specialized Test(s): Deferred          Thoracic Spine Area Exam  Skin & Axial Inspection: No masses, redness, or swelling Alignment: Symmetrical Functional ROM: Unrestricted ROM Stability: No instability detected Muscle Tone/Strength: Functionally intact. No obvious neuro-muscular anomalies detected. Sensory (Neurological): Unimpaired Muscle strength & Tone: No palpable anomalies  Lumbar Spine Area Exam  Skin & Axial Inspection: No masses, redness, or swelling Alignment: Symmetrical Functional ROM: Unrestricted ROM      Stability: No instability detected Muscle Tone/Strength: Functionally intact. No obvious neuro-muscular anomalies detected. Sensory (Neurological): Unimpaired Palpation: No palpable anomalies       Provocative Tests: Lumbar Hyperextension and rotation test: evaluation deferred today       Lumbar Lateral bending test: evaluation deferred today       Patrick's Maneuver: evaluation deferred today                    Gait & Posture Assessment  Ambulation: Unassisted Gait: Relatively normal for age and body habitus Posture: WNL   Lower Extremity Exam    Side: Right lower extremity  Side: Left lower extremity  Skin & Extremity Inspection: Extensive scar tissue over the right ankle area where his fractured occurred.  Skin & Extremity Inspection: Skin color, temperature, and hair  growth are WNL. No peripheral edema or  cyanosis. No masses, redness, swelling, asymmetry, or associated skin lesions. No contractures.  Functional ROM: Decreased ROM of ankle and foot  Functional ROM: Unrestricted ROM          Muscle Tone/Strength: Functionally intact. No obvious neuro-muscular anomalies detected.  Muscle Tone/Strength: Functionally intact. No obvious neuro-muscular anomalies detected.  Sensory (Neurological): Allodynia (Painful response to non-painful stimuli)  Sensory (Neurological): Unimpaired  Palpation: Tender  Palpation: No palpable anomalies       The pain appears to be covering the area of the superficial peroneal nerve and medial plantar nerve (tibial nerve).  Indicates having pain in his big toe.  He has had some injections in the back which provided him with very short temporary relief of the pain.  Although there is a question as to whether or not the pain is going from the back or from the area injured, we normally would approach this by blocking distally and going from there.  Assessment  Primary Diagnosis & Pertinent Problem List: The primary encounter diagnosis was Chronic foot pain (Primary Area of Pain) (Right). 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  Status Diagnosis  Persistent Persistent Persistent 1.  Chronic foot pain (Primary Area of Pain) (Right)   2. Chronic ankle pain (Secondary Area of Pain) (Right)   3. Chronic thoracic back pain [facility] Area of Pain) (Midline)   4. DDD (degenerative disc disease), thoracic   5. Cervicalgia   6. 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 DDD (degenerative disc disease), cervical   8. Spondylosis without myelopathy or radiculopathy, cervicothoracic region   9. Neurogenic foot pain (Right)   10. 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)   11. Chronic musculoskeletal pain   12. Chronic pain syndrome   13. Elevated C-reactive protein (CRP)   14. Elevated sed rate   15. Disorder of skeletal system   16. Pharmacologic therapy   17. Long term current use of opiate analgesic   18. Problems influencing health status   19. Tobacco abuse   20. Neurogenic pain of right foot     Problems updated and reviewed during this visit: No problems updated. Plan of Care  Pharmacotherapy (Medications Ordered): Meds ordered this encounter  Medications  . gabapentin (NEURONTIN) 300 MG capsule    Sig: Take 3 capsules (900 mg total) by mouth 4 (four) times daily.    Dispense:  360 capsule    Refill:  2    Do not place medication on "Automatic Refill". Fill one day early if pharmacy is closed on scheduled refill date.  . traMADol (ULTRAM) 50 MG tablet    Sig: Take 1 tablet (50 mg total) by mouth 2 (two) times daily.    Dispense:  60 tablet    Refill:  2    Fill one day early if pharmacy is closed on scheduled refill date. Do not fill until: 03/30/17 To last until: 06/28/17  . cyclobenzaprine (  FLEXERIL) 10 MG tablet    Sig: Take 1 tablet (10 mg total) by mouth at bedtime.    Dispense:  30 tablet    Refill:  2    Do not place medication on "Automatic Refill". Fill one day early if pharmacy is closed on scheduled refill date.  . NONFORMULARY OR COMPOUNDED ITEM    Sig: Sig: Apply 1-2 gm(s) (2-4 pumps) to affected area, 3-4 times/day. (1 pump = 0.5  gm)    Dispense:  1 each    Refill:  2    Compounded cream: 2.5% Lidocaine, 10% Ketamine, 10% Ketoprofen, 6% Gabapentin  Dispense: 120 gm Pump Bottle. (Dispenser: 1 pump = 0.5 gm.)   Medications administered today: Finnick L. Eidson had no medications administered during this visit.   Procedure Orders     NERVE BLOCK Lab Orders  No laboratory test(s) ordered today   Imaging Orders  No imaging studies ordered today   Referral Orders  No referral(s) requested today    Interventional management options: Planned, scheduled, and/or pending:   Diagnostic right-sided tibial nerve block + peroneal nerve block #1 under fluoroscopic guidance and IV sedation   Considering:   Diagnostic right ankle block  Diagnostic right lumbar sympathetic block  Diagnostic midline thoracic epidural steroid injection  Diagnostic bilateral thoracic facet block  Possible bilateral thoracic facet RFA  Diagnostic cervical epidural steroid injection  Diagnostic bilateral cervical facet block  Possible bilateral cervical facet RFA    Palliative PRN treatment(s):   None at this time   Provider-requested follow-up: Return for Procedure (w/ sedation): (R) tibial nerve BLK  + (R) peroneal nerve BLK #1.  Future Appointments  Date Time Provider [place]  04/21/2017  9:00 AM [facility] None  05/31/2017  9:40 AM Bacigalupo, Dionne Bucy, MD [facility] None   Primary Care Physician: Virginia Crews, MD Location: [facility] Note by: Caleb Cola, MD Date: 03/21/2017; Time: 11:38 AM

## 2017-03-24 ENCOUNTER — Ambulatory Visit (INDEPENDENT_AMBULATORY_CARE_PROVIDER_SITE_OTHER): Payer: Medicaid Other | Admitting: Family Medicine

## 2017-03-24 ENCOUNTER — Ambulatory Visit
Admission: RE | Admit: 2017-03-24 | Discharge: 2017-03-24 | Disposition: A | Discharge status: Home / Self Care | Payer: Medicaid Other | Source: Ambulatory Visit | Attending: Pain Medicine | Admitting: Pain Medicine

## 2017-03-24 ENCOUNTER — Telehealth: Payer: Self-pay

## 2017-03-24 ENCOUNTER — Encounter: Payer: Self-pay | Admitting: Family Medicine

## 2017-03-24 ENCOUNTER — Ambulatory Visit: Payer: Medicaid Other | Admitting: Pain Medicine

## 2017-03-24 ENCOUNTER — Encounter: Payer: Self-pay | Admitting: Pain Medicine

## 2017-03-24 VITALS — BP 128/80 | HR 83 | Temp 97.5°F | Resp 16 | Wt 205.0 lb

## 2017-03-24 VITALS — BP 155/78 | HR 65 | Temp 96.5°F | Resp 16 | Ht 68.0 in | Wt 200.0 lb

## 2017-03-24 DIAGNOSIS — M25571 Pain in right ankle and joints of right foot: Secondary | ICD-10-CM

## 2017-03-24 DIAGNOSIS — R7989 Other specified abnormal findings of blood chemistry: Secondary | ICD-10-CM

## 2017-03-24 DIAGNOSIS — M79671 Pain in right foot: Secondary | ICD-10-CM | POA: Insufficient documentation

## 2017-03-24 DIAGNOSIS — N62 Hypertrophy of breast: Secondary | ICD-10-CM | POA: Diagnosis not present

## 2017-03-24 DIAGNOSIS — M792 Neuralgia and neuritis, unspecified: Secondary | ICD-10-CM

## 2017-03-24 DIAGNOSIS — G5731 Lesion of lateral popliteal nerve, right lower limb: Secondary | ICD-10-CM | POA: Insufficient documentation

## 2017-03-24 DIAGNOSIS — G8929 Other chronic pain: Secondary | ICD-10-CM | POA: Diagnosis not present

## 2017-03-24 DIAGNOSIS — M25561 Pain in right knee: Secondary | ICD-10-CM | POA: Diagnosis not present

## 2017-03-24 MED ORDER — MIDAZOLAM HCL 5 MG/5ML IJ SOLN
1.0000 mg | INTRAMUSCULAR | Status: DC | PRN
  Administered 2017-03-24: 2 mg via INTRAVENOUS

## 2017-03-24 MED ORDER — TRIAMCINOLONE ACETONIDE 40 MG/ML IJ SUSP
80.0000 mg | Freq: Once | INTRAMUSCULAR | Status: AC
  Administered 2017-03-24: 40 mg
  Filled 2017-03-24: qty 2

## 2017-03-24 MED ORDER — MIDAZOLAM HCL 5 MG/5ML IJ SOLN
INTRAMUSCULAR | Status: AC
  Filled 2017-03-24: qty 5

## 2017-03-24 MED ORDER — ROPIVACAINE HCL 2 MG/ML IJ SOLN
4.0000 mL | Freq: Once | INTRAMUSCULAR | Status: AC
  Administered 2017-03-24: 4 mL via INTRA_ARTICULAR

## 2017-03-24 MED ORDER — LIDOCAINE HCL 2 % IJ SOLN
INTRAMUSCULAR | Status: AC
  Filled 2017-03-24: qty 20

## 2017-03-24 MED ORDER — ROPIVACAINE HCL 2 MG/ML IJ SOLN
INTRAMUSCULAR | Status: AC
  Filled 2017-03-24: qty 10

## 2017-03-24 MED ORDER — FENTANYL CITRATE (PF) 100 MCG/2ML IJ SOLN
25.0000 ug | INTRAMUSCULAR | Status: DC | PRN
  Administered 2017-03-24: 50 ug via INTRAVENOUS

## 2017-03-24 MED ORDER — LIDOCAINE HCL 2 % IJ SOLN
20.0000 mL | Freq: Once | INTRAMUSCULAR | Status: AC
  Administered 2017-03-24: 400 mg

## 2017-03-24 MED ORDER — FENTANYL CITRATE (PF) 100 MCG/2ML IJ SOLN
INTRAMUSCULAR | Status: AC
  Filled 2017-03-24: qty 2

## 2017-03-24 MED ORDER — TRIAMCINOLONE ACETONIDE 40 MG/ML IJ SUSP
INTRAMUSCULAR | Status: AC
  Filled 2017-03-24: qty 1

## 2017-03-24 MED ORDER — LACTATED RINGERS IV SOLN
1000.0000 mL | Freq: Once | INTRAVENOUS | Status: AC
  Administered 2017-03-24: 1000 mL via INTRAVENOUS

## 2017-03-24 NOTE — Progress Notes (Signed)
Patient: Caleb [name] Male    DOB: 11/23/1955   62 y.o.   MRN: [id number] Visit Date: 03/24/2017  Today's Provider: Lavon Paganini, MD   I, Martha Clan, CMA, am acting as scribe for Lavon Paganini, MD.  Chief Complaint  Patient presents with  . Gynecomastia   Subjective:    HPI   Pt was diagnosed with gynecomastia by a mammogram that was performed on 03/15/2017. Pt states the radiologist informed him that his "hormones" are out of range. This reminded pt that his testosterone was checked by the pain clinic last months, and they found he has low testosterone. He is also experiencing fatigue, and unexpected weight gain.  Reviewed labs from 02/28/17: SHBG wnl, total Testosterone wnl, Free Testosterone 5.7 (low) - of note, this appears to have been drawn at 1205pm  No Known Allergies   Current Outpatient Medications:  .  Adalimumab (HUMIRA PEN) 40 MG/0.4ML PNKT, Inject into the skin every 14 (fourteen) days., Disp: , Rfl:  .  albuterol (PROVENTIL HFA;VENTOLIN HFA) 108 (90 Base) MCG/ACT inhaler, Inhale 1-2 puffs every 6 (six) hours as needed into the lungs for wheezing or shortness of breath., Disp: 1 Inhaler, Rfl: 5 .  aspirin EC 81 MG tablet, Take 81 mg by mouth daily., Disp: , Rfl:  .  [START ON 03/30/2017] cyclobenzaprine (FLEXERIL) 10 MG tablet, Take 1 tablet (10 mg total) by mouth at bedtime., Disp: 30 tablet, Rfl: 2 .  fluticasone (FLOVENT HFA) 220 MCG/ACT inhaler, Inhale 1 puff 2 (two) times daily into the lungs., Disp: 1 Inhaler, Rfl: 5 .  [START ON 03/30/2017] gabapentin (NEURONTIN) 300 MG capsule, Take 3 capsules (900 mg total) by mouth 4 (four) times daily., Disp: 360 capsule, Rfl: 2 .  hydrochlorothiazide (HYDRODIURIL) 25 MG tablet, Take 1 tablet (25 mg total) by mouth daily., Disp: 90 tablet, Rfl: 3 .  isosorbide mononitrate (IMDUR) 30 MG 24 hr tablet, TAKE 0.5 TABLET(S) EVERY DAY BY ORAL ROUTE IN THE MORNING., Disp: 15 tablet, Rfl: 5 .  metoprolol  tartrate (LOPRESSOR) 25 MG tablet, TAKE 1 TABLET BY MOUTH TWICE A DAY, Disp: 60 tablet, Rfl: 2 .  NONFORMULARY OR COMPOUNDED ITEM, Sig: Apply 1-2 gm(s) (2-4 pumps) to affected area, 3-4 times/day. (1 pump = 0.5 gm), Disp: 1 each, Rfl: 2 .  rosuvastatin (CRESTOR) 40 MG tablet, TAKE 1 TABLET BY MOUTH EVERY DAY, Disp: 90 tablet, Rfl: 3 .  sildenafil (REVATIO) 20 MG tablet, Take 1 to 5 tabs PO daily prn, Disp: 30 tablet, Rfl: 6 .  tiotropium (SPIRIVA) 18 MCG inhalation capsule, Place 1 capsule (18 mcg total) daily into inhaler and inhale., Disp: 30 capsule, Rfl: 5 .  [START ON 03/30/2017] traMADol (ULTRAM) 50 MG tablet, Take 1 tablet (50 mg total) by mouth 2 (two) times daily., Disp: 60 tablet, Rfl: 2 .  triamcinolone ointment (KENALOG) 0.5 %, Apply 1 application 2 (two) times daily topically. On psoriasis lesions, Disp: 30 g, Rfl: 2 .  varenicline (CHANTIX CONTINUING MONTH PAK) 1 MG tablet, Take 1 tablet (1 mg total) by mouth 2 (two) times daily., Disp: 60 tablet, Rfl: 3  Review of Systems  Constitutional: Positive for fatigue and unexpected weight change (gain). Negative for activity change, appetite change, chills, diaphoresis and fever.  Respiratory: Negative for shortness of breath.   Cardiovascular: Negative for chest pain, palpitations and leg swelling.    Social History   Tobacco Use  . Smoking status: Current Every Day Smoker  Packs/day: 0.50    Years: 49.00    Pack years: 24.50    Types: Cigarettes  . Smokeless tobacco: Never Used  . Tobacco comment: started smoking at age ; has decreased cigarette use from 2 PPD to 0.5 PPD  Substance Use Topics  . Alcohol use: Yes    Alcohol/week: 2.4 oz    Types: 4 Cans of beer per week   Objective:   There were no vitals taken for this visit. There were no vitals filed for this visit.   Physical Exam  Constitutional: He is oriented to person, place, and time. He appears well-developed and well-nourished. No distress.  HENT:  Head:  Normocephalic and atraumatic.  Eyes: Conjunctivae are normal.  Cardiovascular: Normal rate, regular rhythm, normal heart sounds and intact distal pulses.  No murmur heard. Pulmonary/Chest: Effort normal and breath sounds normal. No respiratory distress. He has no wheezes. He has no rales.  + breast tissue of L breast  Musculoskeletal: He exhibits no edema.  Neurological: He is alert and oriented to person, place, and time.  Psychiatric: He has a normal mood and affect. His behavior is normal.  Vitals reviewed.      Assessment & Plan:     1. Low testosterone in male - did have labs showing low free testosterone level, but this were not AM draws - suggested that patient follow up with Urology and have more accurate testing done - Urology would also be able to treat for this - low testosterone could contribute to fatigue and gynecomastia  2. Gynecomastia - likely related to low testosterone - pending work-up for low testosterone, could consider Endocrine referral if needed   Return if symptoms worsen or fail to improve.   The entirety of the information documented in the History of Present Illness, Review of Systems and Physical Exam were personally obtained by me. Portions of this information were initially documented by Raquel Sarna Ratchford, CMA and reviewed by me for thoroughness and accuracy.    Virginia Crews, MD, MPH [facility] 03/24/2017 1:56 PM

## 2017-03-24 NOTE — Patient Instructions (Signed)

## 2017-03-24 NOTE — Progress Notes (Signed)
Patient's Name: Caleb [name]  MRN: [id number]  Referring Provider: Virginia Crews, MD  DOB: Mar 15, 1955  PCP: Virginia Crews, MD  DOS: 03/24/2017  Note by: Gaspar Cola, MD  Service setting: Ambulatory outpatient  Specialty: Interventional Pain Management  Patient type: Established  Location: [facility] (AMB) Pain Management Facility  Visit type: Interventional Procedure   Primary Reason for Visit: Interventional Pain Management Treatment. CC: Knee Pain (right)  Procedure:  Anesthesia, Analgesia, Anxiolysis:  Type: Diagnostic Common peroneal Nerve Block Region: Lateral Proximal fibular head Level: Distal aspect of lower extremity. Laterality: Right-Side  Type: Moderate (Conscious) Sedation combined with Local Anesthesia Indication(s): Analgesia and Anxiety Local Anesthetic: Lidocaine 1-2% Route: Intravenous (IV) IV Access: Secured Sedation: Meaningful verbal contact was maintained at all times during the procedure    Indications: 1. Chronic foot pain (Primary Area of Pain) (Right)   2. Chronic ankle pain (Secondary Area of Pain) (Right)   3. Neurogenic foot pain (Right)   4. Disorder of superficial peroneal nerve (Right)    Pain Score: Pre-procedure: 5 /10 Post-procedure: 0-No pain/10  Pre-op Assessment:  Mr. Dicenzo is a 62 y.o. (year old), male patient, seen today for interventional treatment. He  has a past surgical history that includes Cardiac catheterization (2015); Lumbar laminectomy (1989, 1999); Cervical fusion (1988, 1998); Liver surgery (2015); Partial colectomy (1990); Inguinal hernia repair (Bilateral, 1975); Lithotripsy; and Colonoscopy with propofol (N/A, 02/24/2017). Mr. Hartog has a current medication list which includes the following prescription(s): adalimumab, albuterol, aspirin ec, cyclobenzaprine, fluticasone, gabapentin, hydrochlorothiazide, isosorbide mononitrate, metoprolol tartrate, NONFORMULARY OR COMPOUNDED ITEM, rosuvastatin, sildenafil,  tiotropium, tramadol, triamcinolone ointment, and varenicline, and the following Facility-Administered Medications: fentanyl and midazolam. His primarily concern today is the Knee Pain (right)  Initial Vital Signs:  Pulse Rate: 69 Temp: 97.7 F (36.5 C) Resp: 16 BP: (!) 143/90 SpO2: 99 %  BMI: Estimated body mass index is 30.41 kg/m as calculated from the following:   Height as of this encounter: 5\' 8"  (1.727 m).   Weight as of this encounter: 200 lb (90.7 kg).  Risk Assessment: Allergies: Reviewed. He has No Known Allergies.  Allergy Precautions: None required Coagulopathies: Reviewed. None identified.  Blood-thinner therapy: None at this time Active Infection(s): Reviewed. None identified. Mr. Petersheim is afebrile  Site Confirmation: Mr. Yang was asked to confirm the procedure and laterality before marking the site Procedure checklist: Completed Consent: Before the procedure and under the influence of no sedative(s), amnesic(s), or anxiolytics, the patient was informed of the treatment options, risks and possible complications. To fulfill our ethical and legal obligations, as recommended by the American Medical Association's Code of Ethics, I have informed the patient of my clinical impression; the nature and purpose of the treatment or procedure; the risks, benefits, and possible complications of the intervention; the alternatives, including doing nothing; the risk(s) and benefit(s) of the alternative treatment(s) or procedure(s); and the risk(s) and benefit(s) of doing nothing. The patient was provided information about the general risks and possible complications associated with the procedure. These may include, but are not limited to: failure to achieve desired goals, infection, bleeding, organ or nerve damage, allergic reactions, paralysis, and death. In addition, the patient was informed of those risks and complications associated to the procedure, such as failure to decrease pain;  infection; bleeding; organ or nerve damage with subsequent damage to sensory, motor, and/or autonomic systems, resulting in permanent pain, numbness, and/or weakness of one or several areas of the body; allergic reactions; (i.e.: anaphylactic reaction); and/or  death. Furthermore, the patient was informed of those risks and complications associated with the medications. These include, but are not limited to: allergic reactions (i.e.: anaphylactic or anaphylactoid reaction(s)); adrenal axis suppression; blood sugar elevation that in diabetics may result in ketoacidosis or comma; water retention that in patients with history of congestive heart failure may result in shortness of breath, pulmonary edema, and decompensation with resultant heart failure; weight gain; swelling or edema; medication-induced neural toxicity; particulate matter embolism and blood vessel occlusion with resultant organ, and/or nervous system infarction; and/or aseptic necrosis of one or more joints. Finally, the patient was informed that Medicine is not an exact science; therefore, there is also the possibility of unforeseen or unpredictable risks and/or possible complications that may result in a catastrophic outcome. The patient indicated having understood very clearly. We have given the patient no guarantees and we have made no promises. Enough time was given to the patient to ask questions, all of which were answered to the patient's satisfaction. Mr. Metzgar has indicated that he wanted to continue with the procedure. Attestation: I, the ordering provider, attest that I have discussed with the patient the benefits, risks, side-effects, alternatives, likelihood of achieving goals, and potential problems during recovery for the procedure that I have provided informed consent. Date  Time: 03/24/2017  8:32 AM  Pre-Procedure Preparation:  Monitoring: As per clinic protocol. Respiration, ETCO2, SpO2, BP, heart rate and rhythm monitor placed  and checked for adequate function Safety Precautions: Patient was assessed for positional comfort and pressure points before starting the procedure. Time-out: I initiated and conducted the "Time-out" before starting the procedure, as per protocol. The patient was asked to participate by confirming the accuracy of the "Time Out" information. Verification of the correct person, site, and procedure were performed and confirmed by me, the nursing staff, and the patient. "Time-out" conducted as per Joint Commission's Universal Protocol (UP.01.01.01). Time: 0932  Description of Procedure Process:   Position: Prone Target Area: Common peroneal nerve as it comes around the head of the fibula. Approach: Lateral approach. Area Prepped: Entire lateral portion of the distal leg, just below the knee Area Prepping solution: ChloraPrep (2% chlorhexidine gluconate and 70% isopropyl alcohol) Safety Precautions: Aspiration looking for blood return was conducted prior to all injections. At no point did we inject any substances, as a needle was being advanced. No attempts were made at seeking any paresthesias. Safe injection practices and needle disposal techniques used. Medications properly checked for expiration dates. SDV (single dose vial) medications used. Description of the Procedure: Protocol guidelines were followed. The patient was placed in position. The target area was identified and the area prepped in the usual manner. Skin & deeper tissues infiltrated with local anesthetic. Appropriate amount of time allowed to pass for local anesthetics to take effect. The procedure needles were then advanced to the target area. Proper needle placement secured. Negative aspiration confirmed. Solution injected in intermittent fashion, asking for systemic symptoms every 0.5cc of injectate. The needles were then removed and the area cleansed, making sure to leave some of the prepping solution back to take advantage of its long  term bactericidal properties.     Vitals:   03/24/17 0940 03/24/17 0947 03/24/17 0957 03/24/17 1009  BP: 120/78 120/78 (!) 157/85 (!) 155/78  Pulse: 65     Resp: 12 16 12 16   Temp:  (!) 96.5 F (35.8 C)    TempSrc:  Temporal    SpO2: 95% 95% 95% 95%  Weight:  Height:        Start Time: 0932 hrs. End Time: 0937 hrs.  Materials:  Needle(s) Type: Regular needle Gauge: 22G Length: 3.5-in Medication(s): Please see orders for medications and dosing details.  Imaging Guidance:  Type of Imaging Technique: Fluoroscopy Guidance (Non-spinal) Indication(s): Assistance in needle guidance and placement for procedures requiring needle placement in or near specific anatomical locations not easily accessible without such assistance. Exposure Time: Please see nurses notes. Contrast: None used. Fluoroscopic Guidance: I was personally present during the use of fluoroscopy. "Tunnel Vision Technique" used to obtain the best possible view of the target area. Parallax error corrected before commencing the procedure. "Direction-depth-direction" technique used to introduce the needle under continuous pulsed fluoroscopy. Once target was reached, antero-posterior, oblique, and lateral fluoroscopic projection used confirm needle placement in all planes. Images permanently stored in EMR. Ultrasound Guidance: N/A Interpretation: No contrast injected. I personally interpreted the imaging intraoperatively. Adequate needle placement confirmed in multiple planes. Permanent images saved into the patient's record.  Antibiotic Prophylaxis:   Anti-infectives (From admission, onward)   None     Indication(s): None identified  Post-operative Assessment:  Post-procedure Vital Signs:  Pulse Rate: 65 Temp: (!) 96.5 F (35.8 C) Resp: 16 BP: (!) 155/78 SpO2: 95 %  EBL: None  Complications: No immediate post-treatment complications observed by team, or reported by patient.  Note: The patient tolerated  the entire procedure well. A repeat set of vitals were taken after the procedure and the patient was kept under observation following institutional policy, for this type of procedure. Post-procedural neurological assessment was performed, showing return to baseline, prior to discharge. The patient was provided with post-procedure discharge instructions, including a section on how to identify potential problems. Should any problems arise concerning this procedure, the patient was given instructions to immediately contact us, at any time, without hesitation. In any case, we plan to contact the patient by telephone for a follow-up status report regarding this interventional procedure.  Comments:  No additional relevant information.  Plan of Care   Imaging Orders     DG C-Arm 1-60 Min-No Report  Procedure Orders     NERVE BLOCK  Medications ordered for procedure: Meds ordered this encounter  Medications  . lidocaine (XYLOCAINE) 2 % (with pres) injection 400 mg  . midazolam (VERSED) 5 MG/5ML injection 1-2 mg    Make sure Flumazenil is available in the pyxis when using this medication. If oversedation occurs, administer 0.2 mg IV over 15 sec. If after 45 sec no response, administer 0.2 mg again over 1 min; may repeat at 1 min intervals; not to exceed 4 doses (1 mg)  . fentaNYL (SUBLIMAZE) injection 25-50 mcg    Make sure Narcan is available in the pyxis when using this medication. In the event of respiratory depression (RR< 8/min): Titrate NARCAN (naloxone) in increments of 0.1 to 0.2 mg IV at 2-3 minute intervals, until desired degree of reversal.  . lactated ringers infusion 1,000 mL  . ropivacaine (PF) 2 mg/mL (0.2%) (NAROPIN) injection 4 mL  . triamcinolone acetonide (KENALOG-40) injection 80 mg   Medications administered: We administered lidocaine, midazolam, fentaNYL, lactated ringers, ropivacaine (PF) 2 mg/mL (0.2%), and triamcinolone acetonide.  See the medical record for exact dosing,  route, and time of administration.  New Prescriptions   No medications on file   Disposition: Discharge home  Discharge Date & Time: 03/24/2017; 1012 hrs.   Physician-requested Follow-up: Return for post-procedure eval (2 wks), w/ Dr. Dossie Arbour.  Future Appointments  Date Time Provider [place]  03/24/2017  1:20 PM Virginia Crews, MD [facility] None  04/13/2017  1:45 PM Milinda Pointer, MD [facility] None  04/21/2017  9:00 AM [facility] None  05/31/2017  9:40 AM Bacigalupo, Dionne Bucy, MD [facility] None   Primary Care Physician: Virginia Crews, MD Location: [facility] Note by: Gaspar Cola, MD Date: 03/24/2017; Time: 11:27 AM  Disclaimer:  Medicine is not an exact science. The only guarantee in medicine is that nothing is guaranteed. It is important to note that the decision to proceed with this intervention was based on the information collected from the patient. The Data and conclusions were drawn from the patient's questionnaire, the interview, and the physical examination. Because the information was provided in large part by the patient, it cannot be guaranteed that it has not been purposely or unconsciously manipulated. Every effort has been made to obtain as much relevant data as possible for this evaluation. It is important to note that the conclusions that lead to this procedure are derived in large part from the available data. Always take into account that the treatment will also be dependent on availability of resources and existing treatment guidelines, considered by other Pain Management Practitioners as being common knowledge and practice, at the time of the intervention. For Medico-Legal purposes, it is also important to point out that variation in procedural techniques and pharmacological choices are the acceptable norm. The indications, contraindications, technique, and results of the above procedure should  only be interpreted and judged by a Board-Certified Interventional Pain Specialist with extensive familiarity and expertise in the same exact procedure and technique.

## 2017-03-24 NOTE — Progress Notes (Signed)
Nursing Pain Medication Assessment:  Safety precautions to be maintained throughout the outpatient stay will include: orient to surroundings, keep bed in low position, maintain call bell within reach at all times, provide assistance with transfer out of bed and ambulation.  Medication Inspection Compliance: Pill count conducted under aseptic conditions, in front of the patient. Neither the pills nor the bottle was removed from the patient's sight at any time. Once count was completed pills were immediately returned to the patient in their original bottle.  Medication: Tramadol (Ultram) Pill/Patch Count: 34 of 60 pills remain Pill/Patch Appearance: Markings consistent with prescribed medication Bottle Appearance: Standard pharmacy container. Clearly labeled. Filled Date: 03 / 07 / 2019 Last Medication intake:  Today

## 2017-03-24 NOTE — Patient Instructions (Signed)
Gynecomastia, Adult Gynecomastia is an overgrowth of gland tissue in a man's breasts. This may cause one or both breasts to become enlarged. This often develops in men who have an imbalance of the male sex hormone (testosterone) and the male sex hormone (estrogen). This means that a man may have too much estrogen, too little testosterone, or both. Gynecomastia may be a normal part of aging for some men. It can also happen to adolescent boys during puberty. What are the causes? Gynecomastia may be caused by:  Certain medicines, such as: ? Estrogen supplements and medicines that act like estrogen in the body. ? Medicines that keep testosterone from functioning normally in the body (testosterone-inhibiting drugs). ? Anabolic steroids. ? Medicines to treat heartburn, cancer, heart disease, mental health problems, HIV (human immunodeficiency virus) or AIDS (acquired immunodeficiency syndrome). ? Antibiotic medicine. ? Chemotherapy medicine.  Recreational drugs, including alcohol, marijuana, and opioids.  Herbal products, including lavender and tea tree oil.  A gene that is passed along from parent to child (inherited).  Tumors in the pituitary or adrenal gland.  An overactive thyroid gland.  Certain inherited disorders, including a genetic disease that causes low testosterone in males (Klinefelter syndrome).  Cancer of the lung, kidney, liver, testicle, or gastrointestinal tract.  Conditions that cause liver or kidney failure.  Poor nutrition and starvation.  Testicle shrinking or failure (testicularatrophy).  In some cases, the cause may not be known. What increases the risk? You may have a higher risk for gynecomastia if you:  Are 50 years old or older.  Are overweight.  Abuse alcohol or other drugs.  Have a family history of gynecomastia.  What are the signs or symptoms?  Most of the time, breast enlargement is the only symptom. The enlargement may start near the  nipple, and the breast tissue may feel firm and rubbery. The breast may feel itchy, painful or tender. How is this diagnosed? This condition may be diagnosed based on:  Your symptoms.  Your medical history.  A physical exam.  Imaging tests, such as: ? An ultrasound. ? A mammogram. ? An MRI.  Blood tests.  Removal of a sample of breast tissue to be tested in a lab (biopsy).  How is this treated? Gynecomastia may go away on its own, without treatment. If gynecomastia is caused by a medical problem or drug abuse, treatment may include:  Getting treatment for the underlying medical problem or for drug abuse.  Changing or stopping medicines.  Medicines to block the effects of estrogen.  Taking a testosterone replacement.  Surgery to remove breast tissue or any lumps in your breasts.  Breast reduction surgery. This may be a possibility if you have severe or painful gynecomastia.  Follow these instructions at home:  Take over-the-counter and prescription medicines only as told by your health care provider.  Talk to your health care provider before taking any herbal medicines or diet supplements.  Do not abuse drugs or alcohol.  Keep all follow-up visits as told by your health care provider. This is important. Contact a health care provider if:  Your breast tissue grows larger or gets more swollen or painful.  You have a lump in your testicle.  You have blood or discharge coming from your nipples.  Your nipple changes shape.  You develop a hard or painful lump in your breast. This information is not intended to replace advice given to you by your health care provider. Make sure you discuss any questions you have with your   health care provider. Document Released: 02/14/2015 Document Revised: 05/30/2015 Document Reviewed: 02/14/2015 Elsevier Interactive Patient Education  2018 [employer]  

## 2017-03-24 NOTE — Telephone Encounter (Signed)
Pt called and said Pharmacy can fill Tramadol but they dont have the cream and both was written on one RX, can pt come and pick up RX for cream to take to another pharmacy

## 2017-03-24 NOTE — Telephone Encounter (Signed)
Rx for cream called in to [facility] as prescribed- with Dr. Adalberto Cole permission. Patient called and informed.

## 2017-03-25 ENCOUNTER — Telehealth: Payer: Self-pay | Admitting: *Deleted

## 2017-03-25 NOTE — Telephone Encounter (Signed)
Spoke with patient re; procedure.  Denies any questions or concerns.

## 2017-03-31 ENCOUNTER — Other Ambulatory Visit: Payer: Self-pay | Admitting: Family Medicine

## 2017-03-31 ENCOUNTER — Telehealth: Payer: Self-pay | Admitting: Family Medicine

## 2017-03-31 NOTE — Telephone Encounter (Signed)
Ok to refill 

## 2017-03-31 NOTE — Telephone Encounter (Signed)
I already sent refill via electronic request.  Should be available.

## 2017-03-31 NOTE — Telephone Encounter (Signed)
Pt called saying that he id out of his metoprolol 25mg   He uses [facility] [address]  He said the pharmacy has already sent for the refill  Pt's call back is [phone]  Thanks teri

## 2017-04-05 ENCOUNTER — Encounter: Payer: Self-pay | Admitting: Family Medicine

## 2017-04-05 ENCOUNTER — Ambulatory Visit: Payer: Medicaid Other | Attending: Neurology

## 2017-04-05 DIAGNOSIS — G4733 Obstructive sleep apnea (adult) (pediatric): Secondary | ICD-10-CM | POA: Insufficient documentation

## 2017-04-07 ENCOUNTER — Ambulatory Visit: Payer: Medicaid Other

## 2017-04-12 ENCOUNTER — Telehealth: Payer: Self-pay | Admitting: Family Medicine

## 2017-04-12 NOTE — Telephone Encounter (Signed)
Faxed [facility] to [facility],[place] on [phone]

## 2017-04-13 ENCOUNTER — Ambulatory Visit: Payer: Medicaid Other | Attending: Pain Medicine | Admitting: Pain Medicine

## 2017-04-13 ENCOUNTER — Encounter: Payer: Self-pay | Admitting: Pain Medicine

## 2017-04-13 ENCOUNTER — Other Ambulatory Visit: Payer: Self-pay

## 2017-04-13 VITALS — BP 141/94 | HR 78 | Temp 98.0°F | Ht 68.0 in | Wt 200.0 lb

## 2017-04-13 DIAGNOSIS — F17210 Nicotine dependence, cigarettes, uncomplicated: Secondary | ICD-10-CM | POA: Diagnosis not present

## 2017-04-13 DIAGNOSIS — Z79899 Other long term (current) drug therapy: Secondary | ICD-10-CM | POA: Diagnosis not present

## 2017-04-13 DIAGNOSIS — G8929 Other chronic pain: Secondary | ICD-10-CM

## 2017-04-13 DIAGNOSIS — M79671 Pain in right foot: Secondary | ICD-10-CM | POA: Insufficient documentation

## 2017-04-13 DIAGNOSIS — E785 Hyperlipidemia, unspecified: Secondary | ICD-10-CM | POA: Insufficient documentation

## 2017-04-13 DIAGNOSIS — I110 Hypertensive heart disease with heart failure: Secondary | ICD-10-CM | POA: Diagnosis not present

## 2017-04-13 DIAGNOSIS — J449 Chronic obstructive pulmonary disease, unspecified: Secondary | ICD-10-CM | POA: Diagnosis not present

## 2017-04-13 DIAGNOSIS — I509 Heart failure, unspecified: Secondary | ICD-10-CM | POA: Diagnosis not present

## 2017-04-13 DIAGNOSIS — Z7982 Long term (current) use of aspirin: Secondary | ICD-10-CM | POA: Diagnosis not present

## 2017-04-13 DIAGNOSIS — G4733 Obstructive sleep apnea (adult) (pediatric): Secondary | ICD-10-CM | POA: Insufficient documentation

## 2017-04-13 DIAGNOSIS — G5731 Lesion of lateral popliteal nerve, right lower limb: Secondary | ICD-10-CM | POA: Diagnosis not present

## 2017-04-13 DIAGNOSIS — G894 Chronic pain syndrome: Secondary | ICD-10-CM | POA: Diagnosis not present

## 2017-04-13 DIAGNOSIS — M25571 Pain in right ankle and joints of right foot: Secondary | ICD-10-CM | POA: Insufficient documentation

## 2017-04-13 DIAGNOSIS — Z9889 Other specified postprocedural states: Secondary | ICD-10-CM | POA: Diagnosis not present

## 2017-04-13 NOTE — Progress Notes (Signed)
Patient's Name: Caleb [name]  MRN: [id number]  Referring Provider: Virginia Crews, MD  DOB: January 15, 1955  PCP: Virginia Crews, MD  DOS: 04/13/2017  Note by: Gaspar Cola, MD  Service setting: Ambulatory outpatient  Specialty: Interventional Pain Management  Location: [facility] (AMB) Pain Management Facility    Patient type: Established   Primary Reason(s) for Visit: Encounter for post-procedure evaluation of chronic illness with mild to moderate exacerbation CC: Foot Pain (right foot)  HPI  Caleb [name] is a 62 y.o. year old, male patient, who comes today for a post-procedure evaluation. He has OSA (obstructive sleep apnea); Myocardial infarction ([place]); Hypertension; Hyperlipidemia; COPD (chronic obstructive pulmonary disease) ([place]); Chronic foot pain (Primary Area of Pain) (Right); Tobacco abuse; Erectile dysfunction; CAD (coronary artery disease), native coronary artery; Chronic ankle pain (Secondary Area of Pain) (Right); Chronic thoracic back pain [facility] Area of Pain) (Midline); Chronic pain syndrome; Long term current use of opiate analgesic; Pharmacologic therapy; Disorder of skeletal system; Problems influencing health status; Elevated C-reactive protein (CRP); Elevated sed rate; 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 His primarily concern today is the Foot Pain (right foot)  Pain Assessment: Location: Right Foot Radiating: Denies Onset: More than a month ago Duration: Chronic pain Quality: Burning, Throbbing, Pressure, Discomfort, Constant Severity: 6 /10 (self-reported pain score)  Note: Reported  level is compatible with observation.                         When using our objective Pain Scale, levels between 6 and 10/10 are said to belong in an emergency room, as it progressively worsens from a 6/10, described as severely limiting, requiring emergency care not usually available at an outpatient pain management facility. At a 6/10 level, communication becomes difficult and requires great effort. Assistance to reach the emergency department may be required. Facial flushing and profuse sweating along with potentially dangerous increases in heart rate and blood pressure will be evident. Effect on ADL: limiting walking Timing: Constant Modifying factors: Nothing  Caleb [name] comes in today for post-procedure evaluation after the treatment done on 03/24/2017.  Further details on both, my assessment(s), as well as the proposed treatment plan, please see below.  Post-Procedure Assessment  03/24/2017 Procedure: Diagnostic right-sided common peroneal nerve block #1 under fluoroscopic guidance and IV sedation Pre-procedure pain score:  5/10 Post-procedure pain score: 0/10 (100% relief) Influential Factors: BMI: 30.41 kg/m Intra-procedural challenges: None observed.         Assessment challenges: None detected.              Reported side-effects: None.        Post-procedural adverse reactions or complications: None reported         Sedation: Sedation provided. When no sedatives are used, the analgesic levels obtained are directly associated to the effectiveness of the local anesthetics. However, when sedation is provided, the level of analgesia obtained during the initial 1 hour following the intervention, is believed to be the result of a combination of factors. These factors may include, but are not limited to: 1. The effectiveness of the local anesthetics used. 2. The effects of the analgesic(s) and/or anxiolytic(s) used. 3. The degree of discomfort experienced by  the patient at the time of the  procedure. 4. The patients ability and reliability in recalling and recording the events. 5. The presence and influence of possible secondary gains and/or psychosocial factors. Reported result: Relief experienced during the 1st hour after the procedure: 100 % (Ultra-Short Term Relief) Caleb [name] has indicated area to have been numb during this time. Interpretative annotation: Clinically appropriate result. Analgesia during this period is likely to be Local Anesthetic and/or IV Sedative (Analgesic/Anxiolytic) related.          Effects of local anesthetic: The analgesic effects attained during this period are directly associated to the localized infiltration of local anesthetics and therefore cary significant diagnostic value as to the etiological location, or anatomical origin, of the pain. Expected duration of relief is directly dependent on the pharmacodynamics of the local anesthetic used. Long-acting (4-6 hours) anesthetics used.  Reported result: Relief during the next 4 to 6 hour after the procedure: 100 % (Short-Term Relief) Caleb [name] has indicated area to have been numb during this time. Interpretative annotation: Clinically appropriate result. Analgesia during this period is likely to be Local Anesthetic-related.          Long-term benefit: Defined as the period of time past the expected duration of local anesthetics (1 hour for short-acting and 4-6 hours for long-acting). With the possible exception of prolonged sympathetic blockade from the local anesthetics, benefits during this period are typically attributed to, or associated with, other factors such as analgesic sensory neuropraxia, antiinflammatory effects, or beneficial biochemical changes provided by agents other than the local anesthetics.  Reported result: Extended relief following procedure: 100 %(x3-4 days) (Long-Term Relief)            Interpretative annotation: Clinically appropriate result. Good relief. No permanent benefit  expected. No significant inflammatory component detected.          Current benefits: Defined as reported results that persistent at this point in time.   Analgesia: 0 %            Function: Back to baseline ROM: Back to baseline Interpretative annotation: Recurrence of symptoms. Limited therapeutic benefit. Effective diagnostic intervention.          Interpretation: Results would suggest a successful diagnostic intervention.                  Plan:  Consider diagnostic procedure No.: 2          Laboratory Chemistry  Inflammation Markers (CRP: Acute Phase) (ESR: Chronic Phase) Lab Results  Component Value Date   CRP 7.3 (H) 02/17/2017   ESRSEDRATE 38 (H) 02/17/2017                         Rheumatology Markers Lab Results  Component Value Date   RF <10.0 02/28/2017   ANA Negative 02/28/2017                        Renal Function Markers Lab Results  Component Value Date   BUN 18 02/17/2017   CREATININE 0.83 02/17/2017   GFRAA 111 02/17/2017   GFRNONAA 96 02/17/2017                              Hepatic Function Markers Lab Results  Component Value Date   AST 19 02/17/2017   ALT 13 10/21/2016   ALBUMIN 4.4 02/17/2017   ALKPHOS 89 02/17/2017  Electrolytes Lab Results  Component Value Date   NA 135 02/17/2017   K 3.8 02/17/2017   CL 93 (L) 02/17/2017   CALCIUM 9.9 02/17/2017   MG 2.0 02/17/2017                        Neuropathy Markers Lab Results  Component Value Date   VITAMINB12 403 02/17/2017   HIV Non Reactive 02/04/2017                        Bone Pathology Markers Lab Results  Component Value Date   25OHVITD1 32 02/17/2017   25OHVITD2 <1.0 02/17/2017   25OHVITD3 32 02/17/2017   TESTOFREE 5.2 (L) 02/28/2017   TESTOSTERONE 279 02/28/2017                         Coagulation Parameters Lab Results  Component Value Date   PLT 248 02/04/2017                        Cardiovascular Markers Lab Results  Component Value Date    TROPONINI <0.03 10/10/2016   HGB 16.7 02/04/2017   HCT 47.7 02/04/2017                         CA Markers No results found for: CEA, CA125, LABCA2                      Note: Lab results reviewed.  Recent Diagnostic Imaging Results  DG C-Arm 1-60 Min-No Report Fluoroscopy was utilized by the requesting physician.  No radiographic  interpretation.   Complexity Note: I personally reviewed the fluoroscopic imaging of the procedure.                        Meds   Current Outpatient Medications:  .  Adalimumab (HUMIRA PEN) 40 MG/0.4ML PNKT, Inject into the skin every 14 (fourteen) days., Disp: , Rfl:  .  albuterol (PROVENTIL HFA;VENTOLIN HFA) 108 (90 Base) MCG/ACT inhaler, Inhale 1-2 puffs every 6 (six) hours as needed into the lungs for wheezing or shortness of breath., Disp: 1 Inhaler, Rfl: 5 .  aspirin EC 81 MG tablet, Take 81 mg by mouth daily., Disp: , Rfl:  .  cyclobenzaprine (FLEXERIL) 10 MG tablet, Take 1 tablet (10 mg total) by mouth at bedtime., Disp: 30 tablet, Rfl: 2 .  fluticasone (FLOVENT HFA) 220 MCG/ACT inhaler, Inhale 1 puff 2 (two) times daily into the lungs., Disp: 1 Inhaler, Rfl: 5 .  gabapentin (NEURONTIN) 300 MG capsule, Take 3 capsules (900 mg total) by mouth 4 (four) times daily., Disp: 360 capsule, Rfl: 2 .  hydrochlorothiazide (HYDRODIURIL) 25 MG tablet, Take 1 tablet (25 mg total) by mouth daily., Disp: 90 tablet, Rfl: 3 .  isosorbide mononitrate (IMDUR) 30 MG 24 hr tablet, TAKE 0.5 TABLET(S) EVERY DAY BY ORAL ROUTE IN THE MORNING., Disp: 15 tablet, Rfl: 5 .  metoprolol tartrate (LOPRESSOR) 25 MG tablet, TAKE 1 TABLET BY MOUTH TWICE A DAY, Disp: 60 tablet, Rfl: 2 .  rosuvastatin (CRESTOR) 40 MG tablet, TAKE 1 TABLET BY MOUTH EVERY DAY, Disp: 90 tablet, Rfl: 3 .  sildenafil (REVATIO) 20 MG tablet, Take 1 to 5 tabs PO daily prn, Disp: 30 tablet, Rfl: 6 .  tiotropium (SPIRIVA) 18 MCG inhalation capsule, Place 1 capsule (18  mcg total) daily into inhaler and inhale.,  Disp: 30 capsule, Rfl: 5 .  traMADol (ULTRAM) 50 MG tablet, Take 1 tablet (50 mg total) by mouth 2 (two) times daily., Disp: 60 tablet, Rfl: 2 .  triamcinolone ointment (KENALOG) 0.5 %, Apply 1 application 2 (two) times daily topically. On psoriasis lesions, Disp: 30 g, Rfl: 2 .  varenicline (CHANTIX CONTINUING MONTH PAK) 1 MG tablet, Take 1 tablet (1 mg total) by mouth 2 (two) times daily., Disp: 60 tablet, Rfl: 3  ROS  Constitutional: Denies any fever or chills Gastrointestinal: No reported hemesis, hematochezia, vomiting, or acute GI distress Musculoskeletal: Denies any acute onset joint swelling, redness, loss of ROM, or weakness Neurological: No reported episodes of acute onset apraxia, aphasia, dysarthria, agnosia, amnesia, paralysis, loss of coordination, or loss of consciousness  Allergies  Caleb [name] has No Known Allergies.  [place]  Drug: Caleb [name]  reports that he does not use drugs. Alcohol:  reports that he drinks about 2.4 oz of alcohol per week. Tobacco:  reports that he has been smoking cigarettes.  He has a 24.50 pack-year smoking history. He has never used smokeless tobacco. Medical:  has a past medical history of CHF (congestive heart failure) ([place]), Chronic foot pain, right (2015), COPD (chronic obstructive pulmonary disease) ([place]), Hyperlipidemia, Hypertension, Myocardial infarction ([place]) (2015), and OSA on CPAP. Surgical: Caleb [name]  has a past surgical history that includes Cardiac catheterization (2015); Lumbar laminectomy (1989, 1999); Cervical fusion (1988, 1998); Liver surgery (2015); Partial colectomy (1990); Inguinal hernia repair (Bilateral, 1975); Lithotripsy; and Colonoscopy with propofol (N/A, 02/24/2017). Family: family history includes Alzheimer's disease in his paternal grandmother; Alzheimer's disease (age of onset: 38) in his father; Breast cancer in his maternal uncle; CAD in his mother; Diabetes in his maternal grandmother; Healthy in his sister; Heart attack  in his maternal uncle; Heart failure in his maternal grandmother; Heart failure (age of onset: 36) in his mother; Non-Hodgkin's lymphoma in his sister.  Constitutional Exam  General appearance: Well nourished, well developed, and well hydrated. In no apparent acute distress Vitals:   04/13/17 1400  BP: (!) 141/94  Pulse: 78  Temp: 98 F (36.7 C)  SpO2: 98%  Weight: 200 lb (90.7 kg)  Height: _0  (1.727 m)   BMI Assessment: Estimated body mass index is 30.41 kg/m as calculated from the following:   Height as of this encounter: _1  (1.727 m).   Weight as of this encounter: 200 lb (90.7 kg).  BMI interpretation table: BMI level Category Range association with higher incidence of chronic pain  <18 kg/m2 Underweight   18.5-24.9 kg/m2 Ideal body weight   25-29.9 kg/m2 Overweight Increased incidence by 20%  30-34.9 kg/m2 Obese (Class I) Increased incidence by 68%  35-39.9 kg/m2 Severe obesity (Class II) Increased incidence by 136%  >40 kg/m2 Extreme obesity (Class III) Increased incidence by 254%   BMI Readings from Last 4 Encounters:  04/14/17 30.41 kg/m  04/13/17 30.41 kg/m  03/24/17 31.17 kg/m  03/24/17 30.41 kg/m   Wt Readings from Last 4 Encounters:  04/14/17 200 lb (90.7 kg)  04/13/17 200 lb (90.7 kg)  03/24/17 205 lb (93 kg)  03/24/17 200 lb (90.7 kg)  Psych/Mental status: Alert, oriented x 3 (person, place, & time)       Eyes: PERLA Respiratory: No evidence of acute respiratory distress  Cervical Spine Area Exam  Skin & Axial Inspection: No masses, redness, edema, swelling, or associated skin lesions Alignment: Symmetrical Functional ROM: Unrestricted ROM  Stability: No instability detected Muscle Tone/Strength: Functionally intact. No obvious neuro-muscular anomalies detected. Sensory (Neurological): Unimpaired Palpation: No palpable anomalies              Upper Extremity (UE) Exam    Side: Right upper extremity  Side: Left upper extremity  Skin &  Extremity Inspection: Skin color, temperature, and hair growth are WNL. No peripheral edema or cyanosis. No masses, redness, swelling, asymmetry, or associated skin lesions. No contractures.  Skin & Extremity Inspection: Skin color, temperature, and hair growth are WNL. No peripheral edema or cyanosis. No masses, redness, swelling, asymmetry, or associated skin lesions. No contractures.  Functional ROM: Unrestricted ROM          Functional ROM: Unrestricted ROM          Muscle Tone/Strength: Functionally intact. No obvious neuro-muscular anomalies detected.  Muscle Tone/Strength: Functionally intact. No obvious neuro-muscular anomalies detected.  Sensory (Neurological): Unimpaired          Sensory (Neurological): Unimpaired          Palpation: No palpable anomalies              Palpation: No palpable anomalies              Specialized Test(s): Deferred         Specialized Test(s): Deferred          Thoracic Spine Area Exam  Skin & Axial Inspection: No masses, redness, or swelling Alignment: Symmetrical Functional ROM: Unrestricted ROM Stability: No instability detected Muscle Tone/Strength: Functionally intact. No obvious neuro-muscular anomalies detected. Sensory (Neurological): Unimpaired Muscle strength & Tone: No palpable anomalies  Lumbar Spine Area Exam  Skin & Axial Inspection: No masses, redness, or swelling Alignment: Symmetrical Functional ROM: Unrestricted ROM      Stability: No instability detected Muscle Tone/Strength: Functionally intact. No obvious neuro-muscular anomalies detected. Sensory (Neurological): Unimpaired Palpation: No palpable anomalies       Provocative Tests: Lumbar Hyperextension and rotation test: evaluation deferred today       Lumbar Lateral bending test: evaluation deferred today       Patrick's Maneuver: evaluation deferred today                    Gait & Posture Assessment  Ambulation: Unassisted Gait: Relatively normal for age and body  habitus Posture: WNL   Lower Extremity Exam    Side: Right lower extremity  Side: Left lower extremity  Skin & Extremity Inspection: Skin color, temperature, and hair growth are WNL. No peripheral edema or cyanosis. No masses, redness, swelling, asymmetry, or associated skin lesions. No contractures.  Skin & Extremity Inspection: Skin color, temperature, and hair growth are WNL. No peripheral edema or cyanosis. No masses, redness, swelling, asymmetry, or associated skin lesions. No contractures.  Functional ROM: Minimal ROM Ankle joint  Functional ROM: Unrestricted ROM          Muscle Tone/Strength: Functionally intact. No obvious neuro-muscular anomalies detected.  Muscle Tone/Strength: Functionally intact. No obvious neuro-muscular anomalies detected.  Sensory (Neurological): Allodynia (Painful response to non-painful stimuli)  Sensory (Neurological): Unimpaired  Palpation: Complains of area being tender to palpation  Palpation: No palpable anomalies   Assessment  Primary Diagnosis & Pertinent Problem List: The primary encounter diagnosis was Disorder of superficial peroneal nerve (Right). Diagnoses of Chronic ankle pain (Secondary Area of Pain) (Right), Chronic foot pain (Primary Area of Pain) (Right), and Chronic pain syndrome were also pertinent to this visit.  Status Diagnosis  Persistent Persistent Persistent  1. Disorder of superficial peroneal nerve (Right)   2. Chronic ankle pain (Secondary Area of Pain) (Right)   3. Chronic foot pain (Primary Area of Pain) (Right)   4. Chronic pain syndrome     Problems updated and reviewed during this visit: No problems updated. Plan of Care  Pharmacotherapy (Medications Ordered): No orders of the defined types were placed in this encounter.  Medications administered today: Caleb [name] had no medications administered during this visit.   Procedure Orders     NERVE BLOCK Lab Orders  No laboratory test(s) ordered today   Imaging  Orders  No imaging studies ordered today   Referral Orders  No referral(s) requested today    Interventional management options: Planned, scheduled, and/or pending:   Diagnostic right-sided tibial nerve block + peroneal nerve block #2 under fluoroscopic guidance and IV sedation   Considering:   Diagnostic right ankle block Diagnosticright lumbar sympathetic block Diagnostic midlinethoracic epidural steroid injection Diagnostic bilateral thoracic facet block Possible bilateral thoracic facet RFA Diagnostic cervical epidural steroid injection Diagnostic bilateral cervical facet block Possible bilateral cervical facet RFA   Palliative PRN treatment(s):   None at this time   Provider-requested follow-up: Return for Procedure (w/ sedation): (R) Common Peroneal NB.  Future Appointments  Date Time Provider [place]  04/21/2017  9:00 AM [facility] None  05/11/2017  9:15 AM Milinda Pointer, MD [facility] None  05/31/2017  9:40 AM Bacigalupo, Dionne Bucy, MD [facility] None   Primary Care Physician: Virginia Crews, MD Location: [facility] Facility Note by: Gaspar Cola, MD Date: 04/13/2017; Time: 2:33 PM

## 2017-04-13 NOTE — Patient Instructions (Signed)
____________________________________________________________________________________________  Preparing for Procedure with Sedation  Instructions: . Oral Intake: Do not eat or drink anything for at least 8 hours prior to your procedure. . Transportation: Public transportation is not allowed. Bring an adult driver. The driver must be physically present in our waiting room before any procedure can be started. . Physical Assistance: Bring an adult physically capable of assisting you, in the event you need help. This adult should keep you company at home for at least 6 hours after the procedure. . Blood Pressure Medicine: Take your blood pressure medicine with a sip of water the morning of the procedure. . Blood thinners:  . Diabetics on insulin: Notify the staff so that you can be scheduled 1st case in the morning. If your diabetes requires high dose insulin, take only  of your normal insulin dose the morning of the procedure and notify the staff that you have done so. . Preventing infections: Shower with an antibacterial soap the morning of your procedure. . Build-up your immune system: Take 1000 mg of Vitamin C with every meal (3 times a day) the day prior to your procedure. . Antibiotics: Inform the staff if you have a condition or reason that requires you to take antibiotics before dental procedures. . Pregnancy: If you are pregnant, call and cancel the procedure. . Sickness: If you have a cold, fever, or any active infections, call and cancel the procedure. . Arrival: You must be in the facility at least 30 minutes prior to your scheduled procedure. . Children: Do not bring children with you. . Dress appropriately: Bring dark clothing that you would not mind if they get stained. . Valuables: Do not bring any jewelry or valuables.  Procedure appointments are reserved for interventional treatments only. . No Prescription Refills. . No medication changes will be discussed during procedure  appointments. . No disability issues will be discussed.  Remember:  Regular Business hours are:  Monday to Thursday 8:00 AM to 4:00 PM  Provider's Schedule: Darwin Rothlisberger, MD:  Procedure days: Tuesday and Thursday 7:30 AM to 4:00 PM  Bilal Lateef, MD:  Procedure days: Monday and Wednesday 7:30 AM to 4:00 PM ____________________________________________________________________________________________    

## 2017-04-14 ENCOUNTER — Ambulatory Visit
Admission: RE | Admit: 2017-04-14 | Discharge: 2017-04-14 | Disposition: A | Discharge status: Home / Self Care | Payer: Medicaid Other | Source: Ambulatory Visit | Attending: Pain Medicine | Admitting: Pain Medicine

## 2017-04-14 ENCOUNTER — Other Ambulatory Visit: Payer: Self-pay

## 2017-04-14 ENCOUNTER — Encounter: Payer: Self-pay | Admitting: Pain Medicine

## 2017-04-14 ENCOUNTER — Ambulatory Visit (HOSPITAL_BASED_OUTPATIENT_CLINIC_OR_DEPARTMENT_OTHER): Payer: Medicaid Other | Admitting: Pain Medicine

## 2017-04-14 VITALS — BP 129/73 | HR 80 | Temp 97.5°F | Resp 16 | Ht 68.0 in | Wt 200.0 lb

## 2017-04-14 DIAGNOSIS — G8929 Other chronic pain: Secondary | ICD-10-CM

## 2017-04-14 DIAGNOSIS — M79671 Pain in right foot: Secondary | ICD-10-CM

## 2017-04-14 DIAGNOSIS — G5731 Lesion of lateral popliteal nerve, right lower limb: Secondary | ICD-10-CM

## 2017-04-14 DIAGNOSIS — M25571 Pain in right ankle and joints of right foot: Secondary | ICD-10-CM | POA: Insufficient documentation

## 2017-04-14 DIAGNOSIS — M792 Neuralgia and neuritis, unspecified: Secondary | ICD-10-CM

## 2017-04-14 MED ORDER — ROPIVACAINE HCL 2 MG/ML IJ SOLN
4.0000 mL | Freq: Once | INTRAMUSCULAR | Status: AC
  Administered 2017-04-14: 10 mL via PERINEURAL
  Filled 2017-04-14: qty 10

## 2017-04-14 MED ORDER — FENTANYL CITRATE (PF) 100 MCG/2ML IJ SOLN
25.0000 ug | INTRAMUSCULAR | Status: DC | PRN
  Administered 2017-04-14: 50 ug via INTRAVENOUS
  Filled 2017-04-14: qty 2

## 2017-04-14 MED ORDER — TRIAMCINOLONE ACETONIDE 40 MG/ML IJ SUSP
40.0000 mg | Freq: Once | INTRAMUSCULAR | Status: AC
  Administered 2017-04-14: 40 mg
  Filled 2017-04-14: qty 1

## 2017-04-14 MED ORDER — LIDOCAINE HCL 2 % IJ SOLN
20.0000 mL | Freq: Once | INTRAMUSCULAR | Status: AC
  Administered 2017-04-14: 400 mg
  Filled 2017-04-14: qty 20

## 2017-04-14 MED ORDER — LACTATED RINGERS IV SOLN
1000.0000 mL | Freq: Once | INTRAVENOUS | Status: AC
  Administered 2017-04-14: 1000 mL via INTRAVENOUS

## 2017-04-14 MED ORDER — MIDAZOLAM HCL 5 MG/5ML IJ SOLN
1.0000 mg | INTRAMUSCULAR | Status: DC | PRN
  Administered 2017-04-14: 2 mg via INTRAVENOUS
  Filled 2017-04-14: qty 5

## 2017-04-14 NOTE — Progress Notes (Signed)
Patient's Name: Caleb [name]  MRN: [id number]  Referring Provider: Virginia Crews, MD  DOB: 06-Aug-1955  PCP: Virginia Crews, MD  DOS: 04/14/2017  Note by: Gaspar Cola, MD  Service setting: Ambulatory outpatient  Specialty: Interventional Pain Management  Patient type: Established  Location: [facility] (AMB) Pain Management Facility  Visit type: Interventional Procedure   Primary Reason for Visit: Interventional Pain Management Treatment. CC: Foot Pain (right)  Procedure:  Anesthesia, Analgesia, Anxiolysis:  Type: Diagnostic Common peroneal Nerve Block Region: Lateral Proximal fibular head Level: Distal aspect of lower extremity. Laterality: Right-Side  Type: Moderate (Conscious) Sedation combined with Local Anesthesia Indication(s): Analgesia and Anxiety Local Anesthetic: Lidocaine 1-2% Route: Intravenous (IV) IV Access: Secured Sedation: Meaningful verbal contact was maintained at all times during the procedure    Indications: 1. Disorder of superficial peroneal nerve (Right)   2. Chronic foot pain (Primary Area of Pain) (Right)   3. Chronic ankle pain (Secondary Area of Pain) (Right)   4. Neurogenic foot pain (Right)    Pain Score: Pre-procedure: 5 /10 Post-procedure: 0-No pain/10  Pre-op Assessment:  Caleb [name] is a 62 y.o. (year old), male patient, seen today for interventional treatment. He  has a past surgical history that includes Cardiac catheterization (2015); Lumbar laminectomy (1989, 1999); Cervical fusion (1988, 1998); Liver surgery (2015); Partial colectomy (1990); Inguinal hernia repair (Bilateral, 1975); Lithotripsy; and Colonoscopy with propofol (N/A, 02/24/2017). Caleb [name] has a current medication list which includes the following prescription(s): adalimumab, albuterol, aspirin ec, cyclobenzaprine, fluticasone, gabapentin, hydrochlorothiazide, isosorbide mononitrate, metoprolol tartrate, rosuvastatin, sildenafil, tiotropium, tramadol, triamcinolone  ointment, and varenicline, and the following Facility-Administered Medications: fentanyl, lactated ringers, and midazolam. His primarily concern today is the Foot Pain (right)  Initial Vital Signs:  Pulse Rate: 80 Temp: 98 F (36.7 C) Resp: 16 BP: (!) 158/91 SpO2: 100 %  BMI: Estimated body mass index is 30.41 kg/m as calculated from the following:   Height as of this encounter: 5\' 8"  (1.727 m).   Weight as of this encounter: 200 lb (90.7 kg).  Risk Assessment: Allergies: Reviewed. He has No Known Allergies.  Allergy Precautions: None required Coagulopathies: Reviewed. None identified.  Blood-thinner therapy: None at this time Active Infection(s): Reviewed. None identified. Caleb [name] is afebrile  Site Confirmation: Caleb [name] was asked to confirm the procedure and laterality before marking the site Procedure checklist: Completed Consent: Before the procedure and under the influence of no sedative(s), amnesic(s), or anxiolytics, the patient was informed of the treatment options, risks and possible complications. To fulfill our ethical and legal obligations, as recommended by the American Medical Association's Code of Ethics, I have informed the patient of my clinical impression; the nature and purpose of the treatment or procedure; the risks, benefits, and possible complications of the intervention; the alternatives, including doing nothing; the risk(s) and benefit(s) of the alternative treatment(s) or procedure(s); and the risk(s) and benefit(s) of doing nothing. The patient was provided information about the general risks and possible complications associated with the procedure. These may include, but are not limited to: failure to achieve desired goals, infection, bleeding, organ or nerve damage, allergic reactions, paralysis, and death. In addition, the patient was informed of those risks and complications associated to the procedure, such as failure to decrease pain; infection;  bleeding; organ or nerve damage with subsequent damage to sensory, motor, and/or autonomic systems, resulting in permanent pain, numbness, and/or weakness of one or several areas of the body; allergic reactions; (i.e.: anaphylactic reaction); and/or death. Furthermore,  the patient was informed of those risks and complications associated with the medications. These include, but are not limited to: allergic reactions (i.e.: anaphylactic or anaphylactoid reaction(s)); adrenal axis suppression; blood sugar elevation that in diabetics may result in ketoacidosis or comma; water retention that in patients with history of congestive heart failure may result in shortness of breath, pulmonary edema, and decompensation with resultant heart failure; weight gain; swelling or edema; medication-induced neural toxicity; particulate matter embolism and blood vessel occlusion with resultant organ, and/or nervous system infarction; and/or aseptic necrosis of one or more joints. Finally, the patient was informed that Medicine is not an exact science; therefore, there is also the possibility of unforeseen or unpredictable risks and/or possible complications that may result in a catastrophic outcome. The patient indicated having understood very clearly. We have given the patient no guarantees and we have made no promises. Enough time was given to the patient to ask questions, all of which were answered to the patient's satisfaction. Caleb [name] has indicated that he wanted to continue with the procedure. Attestation: I, the ordering provider, attest that I have discussed with the patient the benefits, risks, side-effects, alternatives, likelihood of achieving goals, and potential problems during recovery for the procedure that I have provided informed consent. Date  Time: 04/14/2017  7:59 AM  Pre-Procedure Preparation:  Monitoring: As per clinic protocol. Respiration, ETCO2, SpO2, BP, heart rate and rhythm monitor placed and  checked for adequate function Safety Precautions: Patient was assessed for positional comfort and pressure points before starting the procedure. Time-out: I initiated and conducted the "Time-out" before starting the procedure, as per protocol. The patient was asked to participate by confirming the accuracy of the "Time Out" information. Verification of the correct person, site, and procedure were performed and confirmed by me, the nursing staff, and the patient. "Time-out" conducted as per Joint Commission's Universal Protocol (UP.01.01.01). Time: 67  Description of Procedure Process:   Position: Supine Target Area: Space between tibial medial malleoluslunate and Talus Approach: Dorsal approach. Area Prepped: Entire ankle Region Prepping solution: ChloraPrep (2% chlorhexidine gluconate and 70% isopropyl alcohol) Safety Precautions: Aspiration looking for blood return was conducted prior to all injections. At no point did we inject any substances, as a needle was being advanced. No attempts were made at seeking any paresthesias. Safe injection practices and needle disposal techniques used. Medications properly checked for expiration dates. SDV (single dose vial) medications used. Description of the Procedure: Protocol guidelines were followed. The patient was placed in position. The target area was identified and the area prepped in the usual manner. Skin & deeper tissues infiltrated with local anesthetic. Appropriate amount of time allowed to pass for local anesthetics to take effect. The procedure needles were then advanced to the target area. Proper needle placement secured. Negative aspiration confirmed. Solution injected in intermittent fashion, asking for systemic symptoms every 0.5cc of injectate. The needles were then removed and the area cleansed, making sure to leave some of the prepping solution back to take advantage of its long term bactericidal properties.     Vitals:   04/14/17 0843  04/14/17 0853 04/14/17 0903 04/14/17 0914  BP: 129/70 122/75 124/74 129/73  Pulse:      Resp: 16 16 13 16   Temp:  (!) 97.5 F (36.4 C)    SpO2: 95% 95% 95% 98%  Weight:      Height:        Start Time: 0840 hrs. End Time: 0841 hrs.  Materials:  Needle(s) Type: Regular  needle Gauge: 22G Length: 3.5-in Medication(s): Please see orders for medications and dosing details.  Imaging Guidance:  Type of Imaging Technique: Fluoroscopy Guidance (Non-spinal) Indication(s): Assistance in needle guidance and placement for procedures requiring needle placement in or near specific anatomical locations not easily accessible without such assistance. Exposure Time: Please see nurses notes. Contrast: None used. Fluoroscopic Guidance: I was personally present during the use of fluoroscopy. "Tunnel Vision Technique" used to obtain the best possible view of the target area. Parallax error corrected before commencing the procedure. "Direction-depth-direction" technique used to introduce the needle under continuous pulsed fluoroscopy. Once target was reached, antero-posterior, oblique, and lateral fluoroscopic projection used confirm needle placement in all planes. Images permanently stored in EMR. Ultrasound Guidance: N/A Interpretation: No contrast injected. I personally interpreted the imaging intraoperatively. Adequate needle placement confirmed in multiple planes. Permanent images saved into the patient's record.     Antibiotic Prophylaxis:   Anti-infectives (From admission, onward)   None     Indication(s): None identified  Post-operative Assessment:  Post-procedure Vital Signs:  Pulse Rate: 80 Temp: (!) 97.5 F (36.4 C) Resp: 16 BP: 129/73 SpO2: 98 %  EBL: None  Complications: No immediate post-treatment complications observed by team, or reported by patient.  Note: The patient tolerated the entire procedure well. A repeat set of vitals were taken after the procedure and the patient  was kept under observation following institutional policy, for this type of procedure. Post-procedural neurological assessment was performed, showing return to baseline, prior to discharge. The patient was provided with post-procedure discharge instructions, including a section on how to identify potential problems. Should any problems arise concerning this procedure, the patient was given instructions to immediately contact us, at any time, without hesitation. In any case, we plan to contact the patient by telephone for a follow-up status report regarding this interventional procedure.  Comments:  No additional relevant information.  Plan of Care    Imaging Orders     DG C-Arm 1-60 Min-No Report  Procedure Orders     NERVE BLOCK  Medications ordered for procedure: Meds ordered this encounter  Medications  . lidocaine (XYLOCAINE) 2 % (with pres) injection 400 mg  . midazolam (VERSED) 5 MG/5ML injection 1-2 mg    Make sure Flumazenil is available in the pyxis when using this medication. If oversedation occurs, administer 0.2 mg IV over 15 sec. If after 45 sec no response, administer 0.2 mg again over 1 min; may repeat at 1 min intervals; not to exceed 4 doses (1 mg)  . fentaNYL (SUBLIMAZE) injection 25-50 mcg    Make sure Narcan is available in the pyxis when using this medication. In the event of respiratory depression (RR< 8/min): Titrate NARCAN (naloxone) in increments of 0.1 to 0.2 mg IV at 2-3 minute intervals, until desired degree of reversal.  . lactated ringers infusion 1,000 mL  . triamcinolone acetonide (KENALOG-40) injection 40 mg  . ropivacaine (PF) 2 mg/mL (0.2%) (NAROPIN) injection 4 mL   Medications administered: We administered lidocaine, midazolam, fentaNYL, lactated ringers, triamcinolone acetonide, and ropivacaine (PF) 2 mg/mL (0.2%).  See the medical record for exact dosing, route, and time of administration.  New Prescriptions   No medications on file    Disposition: Discharge home  Discharge Date & Time: 04/14/2017; 0919 hrs.   Physician-requested Follow-up: Return for post-procedure eval (2 wks), w/ Dr. Dossie Arbour.  Future Appointments  Date Time Provider [place]  04/21/2017  9:00 AM [facility] None  05/09/2017 10:15 AM  Milinda Pointer, MD [facility] None  05/31/2017  9:40 AM Bacigalupo, Dionne Bucy, MD [facility] None   Primary Care Physician: Virginia Crews, MD Location: [facility] Note by: Gaspar Cola, MD Date: 04/14/2017; Time: 9:29 AM  Disclaimer:  Medicine is not an Chief Strategy Officer. The only guarantee in medicine is that nothing is guaranteed. It is important to note that the decision to proceed with this intervention was based on the information collected from the patient. The Data and conclusions were drawn from the patient's questionnaire, the interview, and the physical examination. Because the information was provided in large part by the patient, it cannot be guaranteed that it has not been purposely or unconsciously manipulated. Every effort has been made to obtain as much relevant data as possible for this evaluation. It is important to note that the conclusions that lead to this procedure are derived in large part from the available data. Always take into account that the treatment will also be dependent on availability of resources and existing treatment guidelines, considered by other Pain Management Practitioners as being common knowledge and practice, at the time of the intervention. For Medico-Legal purposes, it is also important to point out that variation in procedural techniques and pharmacological choices are the acceptable norm. The indications, contraindications, technique, and results of the above procedure should only be interpreted and judged by a Board-Certified Interventional Pain Specialist with extensive familiarity and expertise in the same exact  procedure and technique.

## 2017-04-14 NOTE — Patient Instructions (Signed)

## 2017-04-15 ENCOUNTER — Telehealth: Payer: Self-pay

## 2017-04-15 NOTE — Telephone Encounter (Signed)
Post procedure phone call.  Pt states he is doing good.

## 2017-04-21 ENCOUNTER — Encounter: Payer: Self-pay | Admitting: Urology

## 2017-04-21 ENCOUNTER — Ambulatory Visit: Payer: Medicaid Other | Admitting: Urology

## 2017-04-21 VITALS — BP 176/70 | HR 62 | Ht 68.0 in | Wt 201.8 lb

## 2017-04-21 DIAGNOSIS — N529 Male erectile dysfunction, unspecified: Secondary | ICD-10-CM

## 2017-04-21 DIAGNOSIS — N62 Hypertrophy of breast: Secondary | ICD-10-CM

## 2017-04-21 LAB — URINALYSIS, COMPLETE
Bilirubin, UA: NEGATIVE
Glucose, UA: NEGATIVE
Ketones, UA: NEGATIVE
Leukocytes, UA: NEGATIVE
Nitrite, UA: NEGATIVE
Protein, UA: NEGATIVE
RBC, UA: NEGATIVE
Specific Gravity, UA: 1.025 (ref 1.005–1.030)
Urobilinogen, Ur: 0.2 mg/dL (ref 0.2–1.0)
pH, UA: 5.5 (ref 5.0–7.5)

## 2017-04-21 NOTE — Progress Notes (Signed)
The patient is seen today for painful left breast mass.  This is been present for about 1 year.  He has noted some slight change.  He describes the tenderness around the area Ola.  He denies any other lumps or bumps within the breast.  He denies any trauma or events leading to any muscle strain or tear.  The patient had a mammogram performed within the last month which demonstrated gynecomastia and a BI-RADS 2 lesion in the left breast which was read as benign.  We have seen the patient in the past for erectile dysfunction.  The patient has a past medical history of chronic pain, he takes tramadol and gabapentin for his pain.  The patient has a past family history of breast cancer in his uncle.  The patient's twin sister has a history of non-Hodgkin's lymphoma.  The patient has some previous testosterone labs  Obtained by his primary care doctor which demonstrate a testosterone level of 278.  There were no estradiol or estrogen level labs obtained.  Current Outpatient Medications on File Prior to Visit  Medication Sig Dispense Refill  . Adalimumab (HUMIRA PEN) 40 MG/0.4ML PNKT Inject into the skin every 14 (fourteen) days.    Marland Kitchen albuterol (PROVENTIL HFA;VENTOLIN HFA) 108 (90 Base) MCG/ACT inhaler Inhale 1-2 puffs every 6 (six) hours as needed into the lungs for wheezing or shortness of breath. 1 Inhaler 5  . aspirin EC 81 MG tablet Take 81 mg by mouth daily.    . cyclobenzaprine (FLEXERIL) 10 MG tablet Take 1 tablet (10 mg total) by mouth at bedtime. 30 tablet 2  . fluticasone (FLOVENT HFA) 220 MCG/ACT inhaler Inhale 1 puff 2 (two) times daily into the lungs. 1 Inhaler 5  . gabapentin (NEURONTIN) 300 MG capsule Take 3 capsules (900 mg total) by mouth 4 (four) times daily. 360 capsule 2  . hydrochlorothiazide (HYDRODIURIL) 25 MG tablet Take 1 tablet (25 mg total) by mouth daily. 90 tablet 3  . isosorbide mononitrate (IMDUR) 30 MG 24 hr tablet TAKE 0.5 TABLET(S) EVERY DAY BY ORAL ROUTE IN THE  MORNING. 15 tablet 5  . metoprolol tartrate (LOPRESSOR) 25 MG tablet TAKE 1 TABLET BY MOUTH TWICE A DAY 60 tablet 2  . rosuvastatin (CRESTOR) 40 MG tablet TAKE 1 TABLET BY MOUTH EVERY DAY 90 tablet 3  . sildenafil (REVATIO) 20 MG tablet Take 1 to 5 tabs PO daily prn 30 tablet 6  . tiotropium (SPIRIVA) 18 MCG inhalation capsule Place 1 capsule (18 mcg total) daily into inhaler and inhale. 30 capsule 5  . traMADol (ULTRAM) 50 MG tablet Take 1 tablet (50 mg total) by mouth 2 (two) times daily. 60 tablet 2  . triamcinolone ointment (KENALOG) 0.5 % Apply 1 application 2 (two) times daily topically. On psoriasis lesions 30 g 2  . varenicline (CHANTIX CONTINUING MONTH PAK) 1 MG tablet Take 1 tablet (1 mg total) by mouth 2 (two) times daily. 60 tablet 3   No current facility-administered medications on file prior to visit.    Vitals:   04/21/17 0859  BP: (!) 176/70  Pulse: 62  Weight: 91.5 kg (201 lb 12.8 oz)  Height: 5\' 8"  (1.727 m)   NAD Patient has some droop or SAG around his left breast, asymmetric to the right side.  He has some palpable tenderness around the area Ola.  There is approximately a 3 cm soft but tender discrete lesion the area Ola.  There is no axillary lymphadenopathy.   I have independently  reviewed the patient's mammogram and reviewed the results with the patient.  Impression: The patient has a left tender periareolar lesion that was imaged with mammography and read as benign.  I spoke with oncology in regards to this, at this point the patient needs to be followed clinically needs no additional imaging tests.  We do not have any estrogen levels, and if these are elevated, perhaps we can put the patient on Arimidex or tamoxifen to reduce the estrogen levels which would also simultaneously boost his testosterone levels and he would likely benefit from this clinically.  Plan: The plan is for the patient to return in 2 weeks after obtaining an estradiol level.  At that time we  will discuss any additional treatment options.

## 2017-04-29 LAB — ESTRADIOL, FREE
Estradiol, Serum, MS: 13 pg/mL
Free Estradiol, Percent: 2.4 %
Free Estradiol, Serum: 0.31 pg/mL

## 2017-05-09 ENCOUNTER — Ambulatory Visit: Payer: Medicaid Other | Admitting: Pain Medicine

## 2017-05-11 ENCOUNTER — Ambulatory Visit: Payer: Medicaid Other | Attending: Pain Medicine | Admitting: Pain Medicine

## 2017-05-11 ENCOUNTER — Encounter: Payer: Self-pay | Admitting: Pain Medicine

## 2017-05-11 ENCOUNTER — Other Ambulatory Visit: Payer: Self-pay

## 2017-05-11 VITALS — BP 150/102 | HR 62 | Temp 97.7°F | Ht 68.0 in | Wt 200.0 lb

## 2017-05-11 DIAGNOSIS — M792 Neuralgia and neuritis, unspecified: Secondary | ICD-10-CM

## 2017-05-11 DIAGNOSIS — E785 Hyperlipidemia, unspecified: Secondary | ICD-10-CM | POA: Diagnosis not present

## 2017-05-11 DIAGNOSIS — M5412 Radiculopathy, cervical region: Secondary | ICD-10-CM | POA: Diagnosis not present

## 2017-05-11 DIAGNOSIS — G4733 Obstructive sleep apnea (adult) (pediatric): Secondary | ICD-10-CM | POA: Diagnosis not present

## 2017-05-11 DIAGNOSIS — I10 Essential (primary) hypertension: Secondary | ICD-10-CM | POA: Diagnosis not present

## 2017-05-11 DIAGNOSIS — M25571 Pain in right ankle and joints of right foot: Secondary | ICD-10-CM | POA: Insufficient documentation

## 2017-05-11 DIAGNOSIS — G5731 Lesion of lateral popliteal nerve, right lower limb: Secondary | ICD-10-CM | POA: Diagnosis not present

## 2017-05-11 DIAGNOSIS — M9981 Other biomechanical lesions of cervical region: Secondary | ICD-10-CM | POA: Diagnosis not present

## 2017-05-11 DIAGNOSIS — I2510 Atherosclerotic heart disease of native coronary artery without angina pectoris: Secondary | ICD-10-CM | POA: Insufficient documentation

## 2017-05-11 DIAGNOSIS — R531 Weakness: Secondary | ICD-10-CM | POA: Insufficient documentation

## 2017-05-11 DIAGNOSIS — M4802 Spinal stenosis, cervical region: Secondary | ICD-10-CM

## 2017-05-11 DIAGNOSIS — M79602 Pain in left arm: Secondary | ICD-10-CM

## 2017-05-11 DIAGNOSIS — G894 Chronic pain syndrome: Secondary | ICD-10-CM | POA: Insufficient documentation

## 2017-05-11 DIAGNOSIS — I252 Old myocardial infarction: Secondary | ICD-10-CM | POA: Insufficient documentation

## 2017-05-11 DIAGNOSIS — Z7982 Long term (current) use of aspirin: Secondary | ICD-10-CM | POA: Insufficient documentation

## 2017-05-11 DIAGNOSIS — F17210 Nicotine dependence, cigarettes, uncomplicated: Secondary | ICD-10-CM | POA: Insufficient documentation

## 2017-05-11 DIAGNOSIS — M4722 Other spondylosis with radiculopathy, cervical region: Secondary | ICD-10-CM | POA: Insufficient documentation

## 2017-05-11 DIAGNOSIS — G8929 Other chronic pain: Secondary | ICD-10-CM

## 2017-05-11 DIAGNOSIS — R29898 Other symptoms and signs involving the musculoskeletal system: Secondary | ICD-10-CM | POA: Diagnosis not present

## 2017-05-11 DIAGNOSIS — Z79899 Other long term (current) drug therapy: Secondary | ICD-10-CM | POA: Diagnosis not present

## 2017-05-11 DIAGNOSIS — Z79891 Long term (current) use of opiate analgesic: Secondary | ICD-10-CM | POA: Insufficient documentation

## 2017-05-11 DIAGNOSIS — M5010 Cervical disc disorder with radiculopathy, unspecified cervical region: Secondary | ICD-10-CM | POA: Diagnosis not present

## 2017-05-11 DIAGNOSIS — J449 Chronic obstructive pulmonary disease, unspecified: Secondary | ICD-10-CM | POA: Insufficient documentation

## 2017-05-11 DIAGNOSIS — M79671 Pain in right foot: Secondary | ICD-10-CM

## 2017-05-11 NOTE — Progress Notes (Signed)
Patient's Name: Caleb [name]  MRN: [id number]  Referring Provider: Virginia Crews, MD  DOB: 09-15-1955  PCP: Virginia Crews, MD  DOS: 05/11/2017  Note by: Gaspar Cola, MD  Service setting: Ambulatory outpatient  Specialty: Interventional Pain Management  Location: [facility] (AMB) Pain Management Facility    Patient type: Established   Primary Reason(s) for Visit: Encounter for post-procedure evaluation of chronic illness with mild to moderate exacerbation CC: Neck Pain  HPI  Caleb [name] is a 62 y.o. year old, male patient, who comes today for a post-procedure evaluation. He has OSA (obstructive sleep apnea); Myocardial infarction ([place]); Hypertension; Hyperlipidemia; COPD (chronic obstructive pulmonary disease) ([place]); Chronic foot pain (Primary Area of Pain) (Right); Tobacco abuse; Erectile dysfunction; CAD (coronary artery disease), native coronary artery; Chronic ankle pain (Secondary Area of Pain) (Right); Chronic thoracic back pain [facility] Area of Pain) (Midline); Chronic pain syndrome; Long term current use of opiate analgesic; Pharmacologic therapy; Disorder of skeletal system; Problems influencing health status; Elevated C-reactive protein (CRP); Elevated sed rate; 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 His primarily concern today is the Neck Pain  Pain Assessment: Location: Lower Neck Radiating: pain Radiates down left shoulder to left arm, right  foot cold and Numb Onset: More than a month ago Duration: Chronic pain Quality: Burning, Constant, Tightness, Pressure Severity: 4 /10 (subjective, self-reported pain score)  Note: Reported level is compatible with observation.                         When using our objective Pain Scale, levels between 6 and 10/10 are said to belong in an emergency room, as it progressively worsens from a 6/10, described as severely limiting, requiring emergency care not usually available at an outpatient pain management facility. At a 6/10 level, communication becomes difficult and requires great effort. Assistance to reach the emergency department may be required. Facial flushing and profuse sweating along with potentially dangerous increases in heart rate and blood pressure will be evident. Effect on ADL: prolonged walking Timing: Constant Modifying factors: med BP: (!) 150/102  HR: 62  Caleb [name] comes in today for post-procedure evaluation after the treatment done on 04/14/2017. Will be having major dental work to remove some teeth. Infor given for surgeon. New problem with left arm pain and weakness.  Further details on both, my assessment(s), as well as the proposed treatment plan, please see below.  Post-Procedure Assessment  04/14/2017 Procedure: Diagnostic right-sided tibial nerve block + peroneal nerve block#2under fluoroscopic guidance and IV sedation Pre-procedure pain score:  5/10 Post-procedure pain score: 0/10 (100% relief) Influential Factors: BMI: 30.41 kg/m Intra-procedural challenges: None observed.         Assessment challenges: None detected.              Reported side-effects: None.        Post-procedural adverse reactions or complications: None reported         Sedation: Sedation provided. When no sedatives are used, the analgesic levels obtained are directly associated to the effectiveness of the local anesthetics. However, when sedation is  provided, the level of analgesia  obtained during the initial 1 hour following the intervention, is believed to be the result of a combination of factors. These factors may include, but are not limited to: 1. The effectiveness of the local anesthetics used. 2. The effects of the analgesic(s) and/or anxiolytic(s) used. 3. The degree of discomfort experienced by the patient at the time of the procedure. 4. The patients ability and reliability in recalling and recording the events. 5. The presence and influence of possible secondary gains and/or psychosocial factors. Reported result: Relief experienced during the 1st hour after the procedure: 100 % (Ultra-Short Term Relief)            Interpretative annotation: Clinically appropriate result. Analgesia during this period is likely to be Local Anesthetic and/or IV Sedative (Analgesic/Anxiolytic) related.          Effects of local anesthetic: The analgesic effects attained during this period are directly associated to the localized infiltration of local anesthetics and therefore cary significant diagnostic value as to the etiological location, or anatomical origin, of the pain. Expected duration of relief is directly dependent on the pharmacodynamics of the local anesthetic used. Long-acting (4-6 hours) anesthetics used.  Reported result: Relief during the next 4 to 6 hour after the procedure: 100 % (Short-Term Relief)            Interpretative annotation: Clinically appropriate result. Analgesia during this period is likely to be Local Anesthetic-related.          Long-term benefit: Defined as the period of time past the expected duration of local anesthetics (1 hour for short-acting and 4-6 hours for long-acting). With the possible exception of prolonged sympathetic blockade from the local anesthetics, benefits during this period are typically attributed to, or associated with, other factors such as analgesic sensory neuropraxia, antiinflammatory effects, or beneficial biochemical changes  provided by agents other than the local anesthetics.  Reported result: Extended relief following procedure: 20 % (Long-Term Relief)            Interpretative annotation: Clinically appropriate result. Partial relief. Incomplete therapeutic success. No significant inflammatory component detected.          Current benefits: Defined as reported results that persistent at this point in time.   Analgesia: <25 %            Function: Somewhat improved ROM: Somewhat improved Interpretative annotation: Recurrence of symptoms. Limited therapeutic benefit. Results would suggest persistent aggravating factors.          Interpretation: Results would suggest a successful diagnostic intervention.                  Plan:  Set up procedure as a PRN palliative treatment option for this patient.                Laboratory Chemistry  Inflammation Markers (CRP: Acute Phase) (ESR: Chronic Phase) Lab Results  Component Value Date   CRP 7.3 (H) 02/17/2017   ESRSEDRATE 38 (H) 02/17/2017                         Rheumatology Markers Lab Results  Component Value Date   RF <10.0 02/28/2017   ANA Negative 02/28/2017                        Renal Function Markers Lab Results  Component Value Date   BUN 18 02/17/2017   CREATININE 0.83 02/17/2017  GFRAA 111 02/17/2017   GFRNONAA 96 02/17/2017                              Hepatic Function Markers Lab Results  Component Value Date   AST 19 02/17/2017   ALT 13 10/21/2016   ALBUMIN 4.4 02/17/2017   ALKPHOS 89 02/17/2017                        Electrolytes Lab Results  Component Value Date   NA 135 02/17/2017   K 3.8 02/17/2017   CL 93 (L) 02/17/2017   CALCIUM 9.9 02/17/2017   MG 2.0 02/17/2017                        Neuropathy Markers Lab Results  Component Value Date   VITAMINB12 403 02/17/2017   HIV Non Reactive 02/04/2017                        Bone Pathology Markers Lab Results  Component Value Date   25OHVITD1 32 02/17/2017    25OHVITD2 <1.0 02/17/2017   25OHVITD3 32 02/17/2017   TESTOFREE 5.2 (L) 02/28/2017   TESTOSTERONE 279 02/28/2017                         Coagulation Parameters Lab Results  Component Value Date   PLT 248 02/04/2017                        Cardiovascular Markers Lab Results  Component Value Date   TROPONINI <0.03 10/10/2016   HGB 16.7 02/04/2017   HCT 47.7 02/04/2017                         Note: Lab results reviewed.  Recent Diagnostic Imaging Results  DG C-Arm 1-60 Min-No Report Fluoroscopy was utilized by the requesting physician.  No radiographic  interpretation.   Complexity Note: I personally reviewed the fluoroscopic imaging of the procedure.                        Meds   Current Outpatient Medications:  .  Adalimumab (HUMIRA PEN) 40 MG/0.4ML PNKT, Inject into the skin every 14 (fourteen) days., Disp: , Rfl:  .  albuterol (PROVENTIL HFA;VENTOLIN HFA) 108 (90 Base) MCG/ACT inhaler, Inhale 1-2 puffs every 6 (six) hours as needed into the lungs for wheezing or shortness of breath., Disp: 1 Inhaler, Rfl: 5 .  aspirin EC 81 MG tablet, Take 81 mg by mouth daily., Disp: , Rfl:  .  cyclobenzaprine (FLEXERIL) 10 MG tablet, Take 1 tablet (10 mg total) by mouth at bedtime., Disp: 30 tablet, Rfl: 2 .  fluticasone (FLOVENT HFA) 220 MCG/ACT inhaler, Inhale 1 puff 2 (two) times daily into the lungs., Disp: 1 Inhaler, Rfl: 5 .  gabapentin (NEURONTIN) 300 MG capsule, Take 3 capsules (900 mg total) by mouth 4 (four) times daily., Disp: 360 capsule, Rfl: 2 .  hydrochlorothiazide (HYDRODIURIL) 25 MG tablet, Take 1 tablet (25 mg total) by mouth daily., Disp: 90 tablet, Rfl: 3 .  isosorbide mononitrate (IMDUR) 30 MG 24 hr tablet, TAKE 0.5 TABLET(S) EVERY DAY BY ORAL ROUTE IN THE MORNING., Disp: 15 tablet, Rfl: 5 .  metoprolol tartrate (LOPRESSOR) 25 MG tablet, TAKE 1 TABLET BY MOUTH  TWICE A DAY, Disp: 60 tablet, Rfl: 2 .  rosuvastatin (CRESTOR) 40 MG tablet, TAKE 1 TABLET BY MOUTH EVERY DAY,  Disp: 90 tablet, Rfl: 3 .  sildenafil (REVATIO) 20 MG tablet, Take 1 to 5 tabs PO daily prn, Disp: 30 tablet, Rfl: 6 .  tiotropium (SPIRIVA) 18 MCG inhalation capsule, Place 1 capsule (18 mcg total) daily into inhaler and inhale., Disp: 30 capsule, Rfl: 5 .  traMADol (ULTRAM) 50 MG tablet, Take 1 tablet (50 mg total) by mouth 2 (two) times daily., Disp: 60 tablet, Rfl: 2 .  triamcinolone ointment (KENALOG) 0.5 %, Apply 1 application 2 (two) times daily topically. On psoriasis lesions, Disp: 30 g, Rfl: 2 .  varenicline (CHANTIX CONTINUING MONTH PAK) 1 MG tablet, Take 1 tablet (1 mg total) by mouth 2 (two) times daily., Disp: 60 tablet, Rfl: 3  ROS  Constitutional: Denies any fever or chills Gastrointestinal: No reported hemesis, hematochezia, vomiting, or acute GI distress Musculoskeletal: Denies any acute onset joint swelling, redness, loss of ROM, or weakness Neurological: No reported episodes of acute onset apraxia, aphasia, dysarthria, agnosia, amnesia, paralysis, loss of coordination, or loss of consciousness  Allergies  Mr. Chow has No Known Allergies.  [place]  Drug: Mr. Hitz  reports that he does not use drugs. Alcohol:  reports that he drinks about 2.4 oz of alcohol per week. Tobacco:  reports that he has been smoking cigarettes.  He has a 24.50 pack-year smoking history. He has never used smokeless tobacco. Medical:  has a past medical history of CHF (congestive heart failure) ([place]), Chronic foot pain, right (2015), COPD (chronic obstructive pulmonary disease) ([place]), Hyperlipidemia, Hypertension, Myocardial infarction ([place]) (2015), and OSA on CPAP. Surgical: Mr. Barretto  has a past surgical history that includes Cardiac catheterization (2015); Lumbar laminectomy (1989, 1999); Cervical fusion (1988, 1998); Liver surgery (2015); Partial colectomy (1990); Inguinal hernia repair (Bilateral, 1975); Lithotripsy; and Colonoscopy with propofol (N/A, 02/24/2017). Family: family history includes  Alzheimer's disease in his paternal grandmother; Alzheimer's disease (age of onset: 77) in his father; Breast cancer in his maternal uncle; CAD in his mother; Diabetes in his maternal grandmother; Healthy in his sister; Heart attack in his maternal uncle; Heart failure in his maternal grandmother; Heart failure (age of onset: 69) in his mother; Non-Hodgkin's lymphoma in his sister.  Constitutional Exam  General appearance: Well nourished, well developed, and well hydrated. In no apparent acute distress Vitals:   05/11/17 0906  BP: (!) 150/102  Pulse: 62  Temp: 97.7 F (36.5 C)  SpO2: 100%  Weight: 200 lb (90.7 kg)  Height: '5\' 8"'$  (1.727 m)   BMI Assessment: Estimated body mass index is 30.41 kg/m as calculated from the following:   Height as of this encounter: '5\' 8"'$  (1.727 m).   Weight as of this encounter: 200 lb (90.7 kg).  BMI interpretation table: BMI level Category Range association with higher incidence of chronic pain  <18 kg/m2 Underweight   18.5-24.9 kg/m2 Ideal body weight   25-29.9 kg/m2 Overweight Increased incidence by 20%  30-34.9 kg/m2 Obese (Class I) Increased incidence by 68%  35-39.9 kg/m2 Severe obesity (Class II) Increased incidence by 136%  >40 kg/m2 Extreme obesity (Class III) Increased incidence by 254%   Patient's current BMI Ideal Body weight  Body mass index is 30.41 kg/m. Ideal body weight: 68.4 kg (150 lb 12.7 oz) Adjusted ideal body weight: 77.3 kg (170 lb 7.6 oz)   BMI Readings from Last 4 Encounters:  05/13/17 30.81 kg/m  05/11/17  30.41 kg/m  04/21/17 30.68 kg/m  04/14/17 30.41 kg/m   Wt Readings from Last 4 Encounters:  05/13/17 202 lb 9.6 oz (91.9 kg)  05/11/17 200 lb (90.7 kg)  04/21/17 201 lb 12.8 oz (91.5 kg)  04/14/17 200 lb (90.7 kg)  Psych/Mental status: Alert, oriented x 3 (person, place, & time)       Eyes: PERLA Respiratory: No evidence of acute respiratory distress  Cervical Spine Area Exam  Skin & Axial Inspection: No  masses, redness, edema, swelling, or associated skin lesions Alignment: Symmetrical Functional ROM: Mechanically restricted ROM      Stability: No instability detected Muscle Tone/Strength: Functionally intact. No obvious neuro-muscular anomalies detected. Sensory (Neurological): Movement-associated pain Palpation: No palpable anomalies              Upper Extremity (UE) Exam    Side: Right upper extremity  Side: Left upper extremity  Skin & Extremity Inspection: Skin color, temperature, and hair growth are WNL. No peripheral edema or cyanosis. No masses, redness, swelling, asymmetry, or associated skin lesions. No contractures.  Skin & Extremity Inspection: Skin color, temperature, and hair growth are WNL. No peripheral edema or cyanosis. No masses, redness, swelling, asymmetry, or associated skin lesions. No contractures.  Functional ROM: Unrestricted ROM          Functional ROM: Guarding          Muscle Tone/Strength: Functionally intact. No obvious neuro-muscular anomalies detected.  Muscle Tone/Strength: T1 myotomal weakness (Finger Abduction and Adduction)  Sensory (Neurological): Unimpaired          Sensory (Neurological): Dermatomal pain pattern T1 dermatome  Palpation: No palpable anomalies              Palpation: No palpable anomalies              Specialized Test(s): Deferred         Specialized Test(s): Deferred          Thoracic Spine Area Exam  Skin & Axial Inspection: No masses, redness, or swelling Alignment: Symmetrical Functional ROM: Unrestricted ROM Stability: No instability detected Muscle Tone/Strength: Functionally intact. No obvious neuro-muscular anomalies detected. Sensory (Neurological): Unimpaired Muscle strength & Tone: No palpable anomalies  Lumbar Spine Area Exam  Skin & Axial Inspection: No masses, redness, or swelling Alignment: Symmetrical Functional ROM: Unrestricted ROM       Stability: No instability detected Muscle Tone/Strength: Functionally  intact. No obvious neuro-muscular anomalies detected. Sensory (Neurological): Unimpaired Palpation: No palpable anomalies       Provocative Tests: Lumbar Hyperextension and rotation test: evaluation deferred today       Lumbar Lateral bending test: evaluation deferred today       Patrick's Maneuver: evaluation deferred today                    Gait & Posture Assessment  Ambulation: Unassisted Gait: Relatively normal for age and body habitus Posture: WNL   Lower Extremity Exam    Side: Right lower extremity  Side: Left lower extremity  Stability: No instability observed          Stability: No instability observed          Skin & Extremity Inspection: Skin color, temperature, and hair growth are WNL. No peripheral edema or cyanosis. No masses, redness, swelling, asymmetry, or associated skin lesions. No contractures.  Skin & Extremity Inspection: Skin color, temperature, and hair growth are WNL. No peripheral edema or cyanosis. No masses, redness, swelling, asymmetry, or associated skin  lesions. No contractures.  Functional ROM: Unrestricted ROM                  Functional ROM: Unrestricted ROM                  Muscle Tone/Strength: Functionally intact. No obvious neuro-muscular anomalies detected.  Muscle Tone/Strength: Functionally intact. No obvious neuro-muscular anomalies detected.  Sensory (Neurological): Neuropathic pain pattern  Sensory (Neurological): Unimpaired  Palpation: No palpable anomalies  Palpation: No palpable anomalies   Assessment  Primary Diagnosis & Pertinent Problem List: The primary encounter diagnosis was Disorder of superficial peroneal nerve (Right). Diagnoses of Neurogenic foot pain (Right), Chronic foot pain (Primary Area of Pain) (Right), Chronic ankle pain (Secondary Area of Pain) (Right), Chronic pain of left upper extremity, Cervical spondylosis with radiculopathy, Cervical disc disorder with radiculopathy of cervical region, Left arm weakness, and Cervical  foraminal stenosis (C5-6, 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) were also pertinent to this visit.  Status Diagnosis  Persistent Improving Improving 1. Disorder of superficial peroneal nerve (Right)   2. Neurogenic foot pain (Right)   3. Chronic foot pain (Primary Area of Pain) (Right)   4. Chronic ankle pain (Secondary Area of Pain) (Right)   5. Chronic pain of left upper extremity   6. Cervical spondylosis with radiculopathy   7. Cervical disc disorder with radiculopathy of cervical region   8. Left arm weakness   9. 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 Plan of Care  Pharmacotherapy (Medications Ordered): No orders of the defined types were placed in this encounter.  Medications administered today: Red L. Luna had no medications administered during this visit.   Procedure Orders     Radiofrequency ablation, other     Cervical Epidural Injection Lab Orders  No laboratory test(s) ordered today   Imaging Orders  No imaging studies ordered today   Referral Orders  No referral(s) requested today    Interventional management options: Planned, scheduled, and/or pending:   Diagnostic left CESI #1 under fluoro and IV sedation. Therapeutic Right Common Peroneal Nerve RFA Upper extremity EMG/PNCV   Considering:   Diagnostic right ankle block Diagnosticright lumbar sympathetic block Diagnostic midlinethoracic epidural steroid injection Diagnostic bilateral thoracic facet block Possible bilateral thoracic facet RFA Diagnostic cervical epidural steroid injection Diagnostic bilateral cervical facet block Possible bilateral cervical facet RFA   Palliative PRN treatment(s):   None at this time   Provider-requested follow-up: Return for Procedure (w/ sedation): (L) CESI #1, RFA (fluoro + sedation): (R) Common Peroneal Nerve RFA.  Future Appointments  Date Time Provider  [place]  05/17/2017  9:45 AM Milinda Pointer, MD [facility] None  05/31/2017  9:40 AM Brita Romp, Dionne Bucy, MD [facility] None  06/21/2017  2:15 PM Milinda Pointer, MD [facility] None   Primary Care Physician: Virginia Crews, MD Location: [facility] Note by: Gaspar Cola, MD Date: 05/11/2017; Time: 10:30 AM

## 2017-05-11 NOTE — Patient Instructions (Addendum)
____________________________________________________________________________________________  Preparing for Procedure with Sedation  Instructions: . Oral Intake: Do not eat or drink anything for at least 8 hours prior to your procedure. . Transportation: Public transportation is not allowed. Bring an adult driver. The driver must be physically present in our waiting room before any procedure can be started. Marland Kitchen Physical Assistance: Bring an adult physically capable of assisting you, in the event you need help. This adult should keep you company at home for at least 6 hours after the procedure. . Blood Pressure Medicine: Take your blood pressure medicine with a sip of water the morning of the procedure. . Blood thinners:  . Diabetics on insulin: Notify the staff so that you can be scheduled 1st case in the morning. If your diabetes requires high dose insulin, take only  of your normal insulin dose the morning of the procedure and notify the staff that you have done so. . Preventing infections: Shower with an antibacterial soap the morning of your procedure. . Build-up your immune system: Take 1000 mg of Vitamin C with every meal (3 times a day) the day prior to your procedure. Marland Kitchen Antibiotics: Inform the staff if you have a condition or reason that requires you to take antibiotics before dental procedures. . Pregnancy: If you are pregnant, call and cancel the procedure. . Sickness: If you have a cold, fever, or any active infections, call and cancel the procedure. . Arrival: You must be in the facility at least 30 minutes prior to your scheduled procedure. . Children: Do not bring children with you. . Dress appropriately: Bring dark clothing that you would not mind if they get stained. . Valuables: Do not bring any jewelry or valuables.  Procedure appointments are reserved for interventional treatments only. Marland Kitchen No Prescription Refills. . No medication changes will be discussed during procedure  appointments. . No disability issues will be discussed.  Remember:  Regular Business hours are:  Monday to Thursday 8:00 AM to 4:00 PM  Provider's Schedule: Milinda Pointer, MD:  Procedure days: Tuesday and Thursday 7:30 AM to 4:00 PM  Gillis Santa, MD:  Procedure days: Monday and Wednesday 7:30 AM to 4:00 PM ____________________________________________________________________________________________   ____________________________________________________________________________________________  Medication Rules  Applies to: All patients receiving prescriptions (written or electronic).  Pharmacy of record: Pharmacy where electronic prescriptions will be sent. If written prescriptions are taken to a different pharmacy, please inform the nursing staff. The pharmacy listed in the electronic medical record should be the one where you would like electronic prescriptions to be sent.  Prescription refills: Only during scheduled appointments. Applies to both, written and electronic prescriptions.  NOTE: The following applies primarily to controlled substances (Opioid* Pain Medications).   Patient's responsibilities: 1. Pain Pills: Bring all pain pills to every appointment (except for procedure appointments). 2. Pill Bottles: Bring pills in original pharmacy bottle. Always bring newest bottle. Bring bottle, even if empty. 3. Medication refills: You are responsible for knowing and keeping track of what medications you need refilled. The day before your appointment, write a list of all prescriptions that need to be refilled. Bring that list to your appointment and give it to the admitting nurse. Prescriptions will be written only during appointments. If you forget a medication, it will not be "Called in", "Faxed", or "electronically sent". You will need to get another appointment to get these prescribed. 4. Prescription Accuracy: You are responsible for carefully inspecting your prescriptions  before leaving our office. Have the discharge nurse carefully go over each prescription  with you, before taking them home. Make sure that your name is accurately spelled, that your address is correct. Check the name and dose of your medication to make sure it is accurate. Check the number of pills, and the written instructions to make sure they are clear and accurate. Make sure that you are given enough medication to last until your next medication refill appointment. 5. Taking Medication: Take medication as prescribed. Never take more pills than instructed. Never take medication more frequently than prescribed. Taking less pills or less frequently is permitted and encouraged, when it comes to controlled substances (written prescriptions).  6. Inform other Doctors: Always inform, all of your healthcare providers, of all the medications you take. 7. Pain Medication from other Providers: You are not allowed to accept any additional pain medication from any other Doctor or Healthcare provider. There are two exceptions to this rule. (see below) In the event that you require additional pain medication, you are responsible for notifying us, as stated below. 8. Medication Agreement: You are responsible for carefully reading and following our Medication Agreement. This must be signed before receiving any prescriptions from our practice. Safely store a copy of your signed Agreement. Violations to the Agreement will result in no further prescriptions. (Additional copies of our Medication Agreement are available upon request.) 9. Laws, Rules, & Regulations: All patients are expected to follow all Federal and [employer], [employer], Rules, [employer]. Ignorance of the Laws does not constitute a valid excuse. The use of any illegal substances is prohibited. 10. Adopted CDC guidelines & recommendations: Target dosing levels will be at or below 60 MME/day. Use of benzodiazepines** is not recommended.  Exceptions: There  are only two exceptions to the rule of not receiving pain medications from other Healthcare Providers. 1. Exception #1 (Emergencies): In the event of an emergency (i.e.: accident requiring emergency care), you are allowed to receive additional pain medication. However, you are responsible for: As soon as you are able, call our office [phone], at any time of the day or night, and leave a message stating your name, the date and nature of the emergency, and the name and dose of the medication prescribed. In the event that your call is answered by a member of our staff, make sure to document and save the date, time, and the name of the person that took your information.  2. Exception #2 (Planned Surgery): In the event that you are scheduled by another doctor or dentist to have any type of surgery or procedure, you are allowed (for a period no longer than 30 days), to receive additional pain medication, for the acute post-op pain. However, in this case, you are responsible for picking up a copy of our "Post-op Pain Management for Surgeons" handout, and giving it to your surgeon or dentist. This document is available at our office, and does not require an appointment to obtain it. Simply go to our office during business hours (Monday-Thursday from 8:00 AM to 4:00 PM) (Friday 8:00 AM to 12:00 Noon) or if you have a scheduled appointment with [place], prior to your surgery, and ask for it by name. In addition, you will need to provide [place] with your name, name of your surgeon, type of surgery, and date of procedure or surgery.  *Opioid medications include: morphine, codeine, oxycodone, oxymorphone, hydrocodone, hydromorphone, meperidine, tramadol, tapentadol, buprenorphine, fentanyl, methadone. **Benzodiazepine medications include: diazepam (Valium), alprazolam (Xanax), clonazepam (Klonopine), lorazepam (Ativan), clorazepate (Tranxene), chlordiazepoxide (Librium), estazolam (Prosom), oxazepam (Serax), temazepam  (  Restoril), triazolam (Halcion) (Last updated: 03/03/2017) ____________________________________________________________________________________________   ____________________________________________________________________________________________  Medication Recommendations and Reminders  Applies to: All patients receiving prescriptions (written and/or electronic).  Medication Rules & Regulations: These rules and regulations exist for your safety and that of others. They are not flexible and neither are we. Dismissing or ignoring them will be considered "non-compliance" with medication therapy, resulting in complete and irreversible termination of such therapy. (See document titled "Medication Rules" for more details.) In all conscience, because of safety reasons, we cannot continue providing a therapy where the patient does not follow instructions.  Pharmacy of record:   Definition: This is the pharmacy where your electronic prescriptions will be sent.   We do not endorse any particular pharmacy.  You are not restricted in your choice of pharmacy.  The pharmacy listed in the electronic medical record should be the one where you want electronic prescriptions to be sent.  If you choose to change pharmacy, simply notify our nursing staff of your choice of new pharmacy.  Recommendations:  Keep all of your pain medications in a safe place, under lock and key, even if you live alone.   After you fill your prescription, take 1 week's worth of pills and put them away in a safe place. You should keep a separate, properly labeled bottle for this purpose. The remainder should be kept in the original bottle. Use this as your primary supply, until it runs out. Once it's gone, then you know that you have 1 week's worth of medicine, and it is time to come in for a prescription refill. If you do this correctly, it is unlikely that you will ever run out of medicine.  To make sure that the above  recommendation works, it is very important that you make sure your medication refill appointments are scheduled at least 1 week before you run out of medicine. To do this in an effective manner, make sure that you do not leave the office without scheduling your next medication management appointment. Always ask the nursing staff to show you in your prescription , when your medication will be running out. Then arrange for the receptionist to get you a return appointment, at least 7 days before you run out of medicine. Do not wait until you have 1 or 2 pills left, to come in. This is very poor planning and does not take into consideration that we may need to cancel appointments due to bad weather, sickness, or emergencies affecting our staff.  Prescription refills and/or changes in medication(s):   Prescription refills, and/or changes in dose or medication, will be conducted only during scheduled medication management appointments. (Applies to both, written and electronic prescriptions.)  No refills on procedure days. No medication will be changed or started on procedure days. No changes, adjustments, and/or refills will be conducted on a procedure day. Doing so will interfere with the diagnostic portion of the procedure.  No phone refills. No medications will be "called into the pharmacy".  No Fax refills.  No weekend refills.  No Holliday refills.  No after hours refills.  Remember:  Business hours are:  Monday to Thursday 8:00 AM to 4:00 PM Provider's Schedule: Dionisio David, NP - Appointments are:  Medication management: Monday to Thursday 8:00 AM to 4:00 PM Milinda Pointer, MD - Appointments are:  Medication management: Monday and Wednesday 8:00 AM to 4:00 PM Procedure day: Tuesday and Thursday 7:30 AM to 4:00 PM Gillis Santa, MD - Appointments are:  Medication management: Tuesday and  Thursday 8:00 AM to 4:00 PM Procedure day: Monday and Wednesday 7:30 AM to 4:00 PM (Last update:  03/03/2017) ____________________________________________________________________________________________   ____________________________________________________________________________________________  CANNABIDIOL (AKA: CBD Oil or Pills)  Applies to: All patients receiving prescriptions of controlled substances (written and/or electronic).  General Information: Cannabidiol (CBD) was discovered in 21. It is one of some 113 identified cannabinoids in cannabis (Marijuana) plants, accounting for up to 40% of the plant's extract. As of 2018, preliminary clinical research on cannabidiol included studies of anxiety, cognition, movement disorders, and pain.  Cannabidiol is consummed in multiple ways, including inhalation of cannabis smoke or vapor, as an aerosol spray into the cheek, and by mouth. It may be supplied as CBD oil containing CBD as the active ingredient (no added tetrahydrocannabinol (THC) or terpenes), a full-plant CBD-dominant hemp extract oil, capsules, dried cannabis, or as a liquid solution. CBD is thought not have the same psychoactivity as THC, and may affect the actions of THC. Studies suggest that CBD may interact with different biological targets, including cannabinoid receptors and other neurotransmitter receptors. As of 2018 the mechanism of action for its biological effects has not been determined.  In the [place], cannabidiol has a limited approval by the Food and Drug Administration (FDA) for treatment of only two types of epilepsy disorders. The side effects of long-term use of the drug include somnolence, decreased appetite, diarrhea, fatigue, malaise, weakness, sleeping problems, and others.  CBD remains a Schedule I drug prohibited for any use.  Legality: Some manufacturers ship CBD products nationally, an illegal action which the FDA has not enforced in 2018, with CBD remaining the subject of an FDA investigational new drug evaluation, and is not considered legal as  a dietary supplement or food ingredient as of December 2018. Federal illegality has made it difficult historically to conduct research on CBD. CBD is openly sold in head shops and health food stores in some states where such sales have not been explicitly legalized.  Warning: Because it is not FDA approved for general use or treatment of pain, it is not required to undergo the same manufacturing controls as prescription drugs.  This means that the available cannabidiol (CBD) may be contaminated with THC.  If this is the case, it will trigger a positive urine drug screen (UDS) test for cannabinoids (Marijuana).  Because a positive UDS for illicit substances is a violation of our medication agreement, your opioid analgesics (pain medicine) may be permanently discontinued. (Last update: 03/24/2017) ____________________________________________________________________________________________   Post-op Pain Management on a Chronic Pain Patient  Why should the surgeon manage his patient's post-op pain? The Surgeon is uniquely qualified to determine the amount of post-operative pain to be expected on a procedure or surgery that he/she has performed. Even with similar surgeries, the surgeon's perspective on expected pain is unique, since he/she performed the procedure and knows the degree of difficulty and/or tissue damage involved in each particular case. The surgeon is also up to date on events such as blood loss, intraoperative complications, and PO (per orum) status that may influence not only the patient's dose and schedule, but route of administration as well.  How about telling chronic pain patients to just double up or increase their usual pain medication intake to compensate for the increased pain? This is a bad idea since it will lead to the patient running out of his/her usual medications early and this may create a problem at the level of the insurance, which supplies medications based on the amount  and  schedule stated on the prescription. Running out early may trigger an event where the refill is denied by the insurance company and/or pharmacy. In addition, this practice provides a very poor paper trail as to why this patient ran out of medication early. In addition, from the perspective of the pain physician, it creates a nightmare in the accounting of the patient's medication.  So, what should I do as a Psychologist, sport and exercise when confronted with a patient that needs surgery and already takes a significant amount of pain medicine for their chronic pain, which may or may not be related to the surgery I have to perform?  This is what the surgeon should do: 1. Do not change the dose or schedule of the pain medications prescribed by the pain specialist. This medication regimen allows for the patient's chronic pain to be under control, so as to bring that patient down to the level of an average individual. 2. Have the patient continue their usual pain management regimen, without any alterations. In addition, manage the post-operative pain as you would on any other "narcotic naive" patient. Do not attempt to compensate for tolerance. This is what the patient's usual regimen will do for you. Simply treat the patient as if they had no chronic pain and as if they were taking no other pain medications. 3. Talk to the patient about the medication, just like you would for anyone else. Do not assume that they are experts in opioids. Make sure you let the patient know that the medication is to be used only if absolutely necessary. (PRN) 4. Prescribe the medication for as long as you would on any other patient undergoing the same type of surgery. Prescribe for the same average amount of time that you would on any other patient. Avoid prescribing for longer periods. 5. Send [place] a copy of the operative report with information about your choice of the post-op pain medication provided. 6. Keep [place] informed of any complications that  may prolong the average duration patient's post-op pain.  If you have any questions, please feel to contact us at [phone]. _____________________________________________________________________________________________

## 2017-05-13 ENCOUNTER — Encounter: Payer: Self-pay | Admitting: Urology

## 2017-05-13 ENCOUNTER — Ambulatory Visit (INDEPENDENT_AMBULATORY_CARE_PROVIDER_SITE_OTHER): Payer: Medicaid Other | Admitting: Urology

## 2017-05-13 VITALS — BP 139/89 | HR 81 | Resp 16 | Ht 68.0 in | Wt 202.6 lb

## 2017-05-13 DIAGNOSIS — N644 Mastodynia: Secondary | ICD-10-CM | POA: Diagnosis not present
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
## 2017-05-17 ENCOUNTER — Ambulatory Visit: Payer: Medicaid Other | Admitting: Pain Medicine

## 2017-05-17 ENCOUNTER — Emergency Department
Admission: EM | Admit: 2017-05-17 | Discharge: 2017-05-18 | Disposition: A | Discharge status: Home / Self Care | Payer: Medicaid Other | Attending: Emergency Medicine | Admitting: Emergency Medicine

## 2017-05-17 ENCOUNTER — Other Ambulatory Visit: Payer: Self-pay

## 2017-05-17 ENCOUNTER — Telehealth: Payer: Self-pay

## 2017-05-17 ENCOUNTER — Ambulatory Visit
Admission: RE | Admit: 2017-05-17 | Discharge: 2017-05-17 | Disposition: A | Discharge status: Home / Self Care | Payer: Medicaid Other | Source: Ambulatory Visit | Attending: Pain Medicine | Admitting: Pain Medicine

## 2017-05-17 ENCOUNTER — Encounter: Payer: Self-pay | Admitting: Pain Medicine

## 2017-05-17 ENCOUNTER — Emergency Department: Payer: Medicaid Other

## 2017-05-17 VITALS — BP 163/103 | HR 75 | Temp 97.8°F | Resp 18 | Ht 68.0 in | Wt 202.0 lb

## 2017-05-17 DIAGNOSIS — G8929 Other chronic pain: Secondary | ICD-10-CM | POA: Diagnosis not present

## 2017-05-17 DIAGNOSIS — I2510 Atherosclerotic heart disease of native coronary artery without angina pectoris: Secondary | ICD-10-CM | POA: Diagnosis not present

## 2017-05-17 DIAGNOSIS — F419 Anxiety disorder, unspecified: Secondary | ICD-10-CM | POA: Insufficient documentation

## 2017-05-17 DIAGNOSIS — M4802 Spinal stenosis, cervical region: Secondary | ICD-10-CM

## 2017-05-17 DIAGNOSIS — I110 Hypertensive heart disease with heart failure: Secondary | ICD-10-CM | POA: Diagnosis not present

## 2017-05-17 DIAGNOSIS — M5010 Cervical disc disorder with radiculopathy, unspecified cervical region: Secondary | ICD-10-CM

## 2017-05-17 DIAGNOSIS — R0789 Other chest pain: Secondary | ICD-10-CM | POA: Insufficient documentation

## 2017-05-17 DIAGNOSIS — I252 Old myocardial infarction: Secondary | ICD-10-CM | POA: Diagnosis not present

## 2017-05-17 DIAGNOSIS — Z7982 Long term (current) use of aspirin: Secondary | ICD-10-CM | POA: Insufficient documentation

## 2017-05-17 DIAGNOSIS — M4722 Other spondylosis with radiculopathy, cervical region: Secondary | ICD-10-CM | POA: Insufficient documentation

## 2017-05-17 DIAGNOSIS — M5030 Other cervical disc degeneration, unspecified cervical region: Secondary | ICD-10-CM | POA: Diagnosis not present

## 2017-05-17 DIAGNOSIS — M79602 Pain in left arm: Secondary | ICD-10-CM

## 2017-05-17 DIAGNOSIS — Z7951 Long term (current) use of inhaled steroids: Secondary | ICD-10-CM | POA: Insufficient documentation

## 2017-05-17 DIAGNOSIS — Z79899 Other long term (current) drug therapy: Secondary | ICD-10-CM | POA: Diagnosis not present

## 2017-05-17 DIAGNOSIS — I509 Heart failure, unspecified: Secondary | ICD-10-CM | POA: Diagnosis not present

## 2017-05-17 DIAGNOSIS — R5381 Other malaise: Secondary | ICD-10-CM | POA: Insufficient documentation

## 2017-05-17 DIAGNOSIS — M542 Cervicalgia: Secondary | ICD-10-CM | POA: Diagnosis present

## 2017-05-17 DIAGNOSIS — F17210 Nicotine dependence, cigarettes, uncomplicated: Secondary | ICD-10-CM | POA: Diagnosis not present

## 2017-05-17 DIAGNOSIS — J449 Chronic obstructive pulmonary disease, unspecified: Secondary | ICD-10-CM | POA: Diagnosis not present

## 2017-05-17 DIAGNOSIS — M4803 Spinal stenosis, cervicothoracic region: Secondary | ICD-10-CM | POA: Diagnosis not present

## 2017-05-17 DIAGNOSIS — Z981 Arthrodesis status: Secondary | ICD-10-CM | POA: Diagnosis not present

## 2017-05-17 DIAGNOSIS — Z9049 Acquired absence of other specified parts of digestive tract: Secondary | ICD-10-CM | POA: Insufficient documentation

## 2017-05-17 HISTORY — DX: Atherosclerotic heart disease of native coronary artery without angina pectoris: I25.10

## 2017-05-17 LAB — TROPONIN I: Troponin I: <0.03 ng/mL (ref <0.03)

## 2017-05-17 LAB — BASIC METABOLIC PANEL
Anion gap: 14 (ref 5–15)
BUN: 18 mg/dL (ref 6–20)
CO2: 27 mmol/L (ref 22–32)
Calcium: 10.2 mg/dL (ref 8.9–10.3)
Chloride: 95 mmol/L — ABNORMAL LOW (ref 101–111)
Creatinine, Ser: 0.78 mg/dL (ref 0.61–1.24)
GFR calc Af Amer: >60 mL/min (ref >60)
GFR calc non Af Amer: >60 mL/min (ref >60)
Glucose, Bld: 227 mg/dL — ABNORMAL HIGH (ref 65–99)
Potassium: 3.8 mmol/L (ref 3.5–5.1)
Sodium: 136 mmol/L (ref 135–145)

## 2017-05-17 LAB — CBC
HCT: 49.5 % (ref 40.0–52.0)
Hemoglobin: 16.8 g/dL (ref 13.0–18.0)
MCH: 31.1 pg (ref 26.0–34.0)
MCHC: 33.9 g/dL (ref 32.0–36.0)
MCV: 91.8 fL (ref 80.0–100.0)
Platelets: 269 10*3/uL (ref 150–440)
RBC: 5.39 MIL/uL (ref 4.40–5.90)
RDW: 13.5 % (ref 11.5–14.5)
WBC: 14.2 10*3/uL — ABNORMAL HIGH (ref 3.8–10.6)

## 2017-05-17 MED ORDER — DEXAMETHASONE SODIUM PHOSPHATE 10 MG/ML IJ SOLN
INTRAMUSCULAR | Status: AC
  Filled 2017-05-17: qty 1

## 2017-05-17 MED ORDER — DEXAMETHASONE SODIUM PHOSPHATE 10 MG/ML IJ SOLN
10.0000 mg | Freq: Once | INTRAMUSCULAR | Status: AC
  Administered 2017-05-17: 10 mg
  Filled 2017-05-17: qty 1

## 2017-05-17 MED ORDER — MIDAZOLAM HCL 5 MG/5ML IJ SOLN
INTRAMUSCULAR | Status: AC
  Filled 2017-05-17: qty 5

## 2017-05-17 MED ORDER — IOPAMIDOL (ISOVUE-M 200) INJECTION 41%
10.0000 mL | Freq: Once | INTRAMUSCULAR | Status: AC
  Administered 2017-05-17: 10 mL via EPIDURAL
  Filled 2017-05-17: qty 10

## 2017-05-17 MED ORDER — MIDAZOLAM HCL 5 MG/5ML IJ SOLN
1.0000 mg | INTRAMUSCULAR | Status: DC | PRN
  Administered 2017-05-17: 2 mg via INTRAVENOUS

## 2017-05-17 MED ORDER — SODIUM CHLORIDE 0.9 % IJ SOLN
INTRAMUSCULAR | Status: AC
  Filled 2017-05-17: qty 10

## 2017-05-17 MED ORDER — LACTATED RINGERS IV SOLN
1000.0000 mL | Freq: Once | INTRAVENOUS | Status: AC
  Administered 2017-05-17: 1000 mL via INTRAVENOUS

## 2017-05-17 MED ORDER — ROPIVACAINE HCL 2 MG/ML IJ SOLN
INTRAMUSCULAR | Status: AC
  Filled 2017-05-17: qty 10

## 2017-05-17 MED ORDER — FENTANYL CITRATE (PF) 100 MCG/2ML IJ SOLN
INTRAMUSCULAR | Status: AC
  Filled 2017-05-17: qty 2

## 2017-05-17 MED ORDER — LIDOCAINE HCL 2 % IJ SOLN
20.0000 mL | Freq: Once | INTRAMUSCULAR | Status: AC
  Administered 2017-05-17: 400 mg
  Filled 2017-05-17: qty 20

## 2017-05-17 MED ORDER — FENTANYL CITRATE (PF) 100 MCG/2ML IJ SOLN
25.0000 ug | INTRAMUSCULAR | Status: DC | PRN
  Administered 2017-05-17: 50 ug via INTRAVENOUS

## 2017-05-17 MED ORDER — SODIUM CHLORIDE 0.9% FLUSH
1.0000 mL | Freq: Once | INTRAVENOUS | Status: AC
  Administered 2017-05-17: 10 mL

## 2017-05-17 MED ORDER — ROPIVACAINE HCL 2 MG/ML IJ SOLN
1.0000 mL | Freq: Once | INTRAMUSCULAR | Status: AC
  Administered 2017-05-17: 10 mL via EPIDURAL

## 2017-05-17 NOTE — Progress Notes (Signed)
Patient's Name: Caleb [name]  MRN: [id number]  Referring Provider: Virginia Crews, MD  DOB: 10-31-1955  PCP: Virginia Crews, MD  DOS: 05/17/2017  Note by: Gaspar Cola, MD  Service setting: Ambulatory outpatient  Specialty: Interventional Pain Management  Patient type: Established  Location: [facility] (AMB) Pain Management Facility  Visit type: Interventional Procedure   Primary Reason for Visit: Interventional Pain Management Treatment. CC: Neck Pain  Procedure:       Anesthesia, Analgesia, Anxiolysis:  Type: Diagnostic, Inter-Laminar, Epidural Steroid Injection #1  Region: Posterior Cervico-thoracic Region Level: C7-T1 Laterality: Left-Sided Paramedial  Type: Moderate (Conscious) Sedation combined with Local Anesthesia Indication(s): Analgesia and Anxiety Route: Intravenous (IV) IV Access: Secured Sedation: Meaningful verbal contact was maintained at all times during the procedure  Local Anesthetic: Lidocaine 1-2%   Indications: 1. DDD (degenerative disc disease), cervical   2. Cervical disc disorder with radiculopathy of cervical region   3. 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 Cervical spondylosis with radiculopathy   5. Chronic pain of left upper extremity    Pain Score: Pre-procedure: 5 /10 Post-procedure: 0-No pain/10  Pre-op Assessment:  [name] is a 62 y.o. (year old), male patient, seen today for interventional treatment. He  has a past surgical history that includes Cardiac catheterization (2015); Lumbar laminectomy (1989, 1999); Cervical fusion (1988, 1998); Liver surgery (2015); Partial colectomy (1990); Inguinal hernia repair (Bilateral, 1975); Lithotripsy; and Colonoscopy with propofol (N/A, 02/24/2017). Mr. Tay has a current medication list which includes the following prescription(s): adalimumab, albuterol, aspirin ec, cyclobenzaprine, fluticasone, gabapentin, hydrochlorothiazide, isosorbide mononitrate, metoprolol  tartrate, rosuvastatin, sildenafil, tiotropium, tramadol, triamcinolone ointment, and varenicline, and the following Facility-Administered Medications: fentanyl and midazolam. His primarily concern today is the Neck Pain  Initial Vital Signs:  Pulse/HCG Rate: 75ECG Heart Rate: 78 Temp: 97.8 F (36.6 C) Resp: 18 BP: (!) 152/97 SpO2: 99 %  BMI: Estimated body mass index is 30.71 kg/m as calculated from the following:   Height as of this encounter: 5\' 8"  (1.727 m).   Weight as of this encounter: 202 lb (91.6 kg).  Risk Assessment: Allergies: Reviewed. He has No Known Allergies.  Allergy Precautions: None required Coagulopathies: Reviewed. None identified.  Blood-thinner therapy: None at this time Active Infection(s): Reviewed. None identified. [name] is afebrile  Site Confirmation: [name] was asked to confirm the procedure and laterality before marking the site Procedure checklist: Completed Consent: Before the procedure and under the influence of no sedative(s), amnesic(s), or anxiolytics, the patient was informed of the treatment options, risks and possible complications. To fulfill our ethical and legal obligations, as recommended by the American Medical Association's Code of Ethics, I have informed the patient of my clinical impression; the nature and purpose of the treatment or procedure; the risks, benefits, and possible complications of the intervention; the alternatives, including doing nothing; the risk(s) and benefit(s) of the alternative treatment(s) or procedure(s); and the risk(s) and benefit(s) of doing nothing. The patient was provided information about the general risks and possible complications associated with the procedure. These may include, but are not limited to: failure to achieve desired goals, infection, bleeding, organ or nerve damage, allergic reactions, paralysis, and death. In addition, the patient was informed of those risks and complications associated to  Spine-related procedures, such as failure to decrease pain; infection (i.e.: Meningitis, epidural or intraspinal abscess); bleeding (i.e.: epidural hematoma, subarachnoid hemorrhage, or any other type of intraspinal or peri-dural bleeding); organ or nerve damage (i.e.: Any  type of peripheral nerve, nerve root, or spinal cord injury) with subsequent damage to sensory, motor, and/or autonomic systems, resulting in permanent pain, numbness, and/or weakness of one or several areas of the body; allergic reactions; (i.e.: anaphylactic reaction); and/or death. Furthermore, the patient was informed of those risks and complications associated with the medications. These include, but are not limited to: allergic reactions (i.e.: anaphylactic or anaphylactoid reaction(s)); adrenal axis suppression; blood sugar elevation that in diabetics may result in ketoacidosis or comma; water retention that in patients with history of congestive heart failure may result in shortness of breath, pulmonary edema, and decompensation with resultant heart failure; weight gain; swelling or edema; medication-induced neural toxicity; particulate matter embolism and blood vessel occlusion with resultant organ, and/or nervous system infarction; and/or aseptic necrosis of one or more joints. Finally, the patient was informed that Medicine is not an exact science; therefore, there is also the possibility of unforeseen or unpredictable risks and/or possible complications that may result in a catastrophic outcome. The patient indicated having understood very clearly. We have given the patient no guarantees and we have made no promises. Enough time was given to the patient to ask questions, all of which were answered to the patient's satisfaction. [name] has indicated that he wanted to continue with the procedure. Attestation: I, the ordering provider, attest that I have discussed with the patient the benefits, risks, side-effects, alternatives,  likelihood of achieving goals, and potential problems during recovery for the procedure that I have provided informed consent. Date  Time: 05/17/2017 10:04 AM  Pre-Procedure Preparation:  Monitoring: As per clinic protocol. Respiration, ETCO2, SpO2, BP, heart rate and rhythm monitor placed and checked for adequate function Safety Precautions: Patient was assessed for positional comfort and pressure points before starting the procedure. Time-out: I initiated and conducted the "Time-out" before starting the procedure, as per protocol. The patient was asked to participate by confirming the accuracy of the "Time Out" information. Verification of the correct person, site, and procedure were performed and confirmed by me, the nursing staff, and the patient. "Time-out" conducted as per Joint Commission's Universal Protocol (UP.01.01.01). Time: 1120  Description of Procedure Process:   Position: Prone with head of the table was raised to facilitate breathing. Target Area: For Epidural Steroid injections the target is the interlaminar space, initially targeting the lower border of the superior vertebral body lamina. Approach: Paramedial approach. Area Prepped: Entire PosteriorCervical Region Prepping solution: ChloraPrep (2% chlorhexidine gluconate and 70% isopropyl alcohol) Safety Precautions: Aspiration looking for blood return was conducted prior to all injections. At no point did we inject any substances, as a needle was being advanced. No attempts were made at seeking any paresthesias. Safe injection practices and needle disposal techniques used. Medications properly checked for expiration dates. SDV (single dose vial) medications used. Description of the Procedure: Protocol guidelines were followed. The procedure needle was introduced through the skin, ipsilateral to the reported pain, and advanced to the target area. Bone was contacted and the needle walked caudad, until the lamina was cleared. The  epidural space was identified using "loss-of-resistance technique" with 2-3 ml of PF-NaCl (0.9% NSS), in a 5cc LOR glass syringe. Vitals:   05/17/17 1130 05/17/17 1135 05/17/17 1144 05/17/17 1155  BP: (!) 135/105 (!) 151/102 (!) 154/108 (!) 163/103  Pulse:      Resp: 14 17 18 18   Temp:      TempSrc:      SpO2: 98% 98% 100% 100%  Weight:  Height:        Start Time: 1120 hrs. End Time: 1129 hrs. Materials:  Needle(s) Type: Epidural needle Gauge: 17G Length: 3.5-in Medication(s): Please see orders for medications and dosing details.  Imaging Guidance (Spinal):  Type of Imaging Technique: Fluoroscopy Guidance (Spinal) Indication(s): Assistance in needle guidance and placement for procedures requiring needle placement in or near specific anatomical locations not easily accessible without such assistance. Exposure Time: Please see nurses notes. Contrast: Before injecting any contrast, we confirmed that the patient did not have an allergy to iodine, shellfish, or radiological contrast. Once satisfactory needle placement was completed at the desired level, radiological contrast was injected. Contrast injected under live fluoroscopy. No contrast complications. See chart for type and volume of contrast used. Fluoroscopic Guidance: I was personally present during the use of fluoroscopy. "Tunnel Vision Technique" used to obtain the best possible view of the target area. Parallax error corrected before commencing the procedure. "Direction-depth-direction" technique used to introduce the needle under continuous pulsed fluoroscopy. Once target was reached, antero-posterior, oblique, and lateral fluoroscopic projection used confirm needle placement in all planes. Images permanently stored in EMR. Interpretation: I personally interpreted the imaging intraoperatively. Adequate needle placement confirmed in multiple planes. Appropriate spread of contrast into desired area was observed. No evidence of  afferent or efferent intravascular uptake. No intrathecal or subarachnoid spread observed. Permanent images saved into the patient's record.  Antibiotic Prophylaxis:   Anti-infectives (From admission, onward)   None     Indication(s): None identified  Post-operative Assessment:  Post-procedure Vital Signs:  Pulse/HCG Rate: 7567 Temp: 97.8 F (36.6 C) Resp: 18 BP: (!) 163/103 SpO2: 100 %  EBL: None  Complications: No immediate post-treatment complications observed by team, or reported by patient.  Note: The patient tolerated the entire procedure well. A repeat set of vitals were taken after the procedure and the patient was kept under observation following institutional policy, for this type of procedure. Post-procedural neurological assessment was performed, showing return to baseline, prior to discharge. The patient was provided with post-procedure discharge instructions, including a section on how to identify potential problems. Should any problems arise concerning this procedure, the patient was given instructions to immediately contact us, at any time, without hesitation. In any case, we plan to contact the patient by telephone for a follow-up status report regarding this interventional procedure.  Comments:  No additional relevant information.  Plan of Care    Imaging Orders     DG C-Arm 1-60 Min-No Report  Procedure Orders     Cervical Epidural Injection  Medications ordered for procedure: Meds ordered this encounter  Medications  . iopamidol (ISOVUE-M) 41 % intrathecal injection 10 mL    Must be Myelogram-compatible. If not available, you may substitute with a water-soluble, non-ionic, hypoallergenic, myelogram-compatible radiological contrast medium.  Marland Kitchen lidocaine (XYLOCAINE) 2 % (with pres) injection 400 mg  . midazolam (VERSED) 5 MG/5ML injection 1-2 mg    Make sure Flumazenil is available in the pyxis when using this medication. If oversedation occurs, administer  0.2 mg IV over 15 sec. If after 45 sec no response, administer 0.2 mg again over 1 min; may repeat at 1 min intervals; not to exceed 4 doses (1 mg)  . fentaNYL (SUBLIMAZE) injection 25-50 mcg    Make sure Narcan is available in the pyxis when using this medication. In the event of respiratory depression (RR< 8/min): Titrate NARCAN (naloxone) in increments of 0.1 to 0.2 mg IV at 2-3 minute intervals, until desired degree of  reversal.  . lactated ringers infusion 1,000 mL  . sodium chloride flush (NS) 0.9 % injection 1 mL  . ropivacaine (PF) 2 mg/mL (0.2%) (NAROPIN) injection 1 mL  . dexamethasone (DECADRON) injection 10 mg   Medications administered: We administered iopamidol, lidocaine, midazolam, fentaNYL, lactated ringers, sodium chloride flush, ropivacaine (PF) 2 mg/mL (0.2%), and dexamethasone.  See the medical record for exact dosing, route, and time of administration.  New Prescriptions   No medications on file   Disposition: Discharge home  Discharge Date & Time: 05/17/2017; 1155 hrs.   Physician-requested Follow-up: Return for post-procedure eval (2 wks), w/ Dr. Dossie Arbour.  Future Appointments  Date Time Provider [place]  05/31/2017  9:40 AM Brita Romp, Dionne Bucy, MD [facility] None  06/13/2017  9:15 AM Milinda Pointer, MD [facility] None  06/21/2017  2:15 PM Milinda Pointer, [place] None   Primary Care Physician: Virginia Crews, MD Location: [facility] Note by: Gaspar Cola, MD Date: 05/17/2017; Time: 1:13 PM  Disclaimer:  Medicine is not an Chief Strategy Officer. The only guarantee in medicine is that nothing is guaranteed. It is important to note that the decision to proceed with this intervention was based on the information collected from the patient. The Data and conclusions were drawn from the patient's questionnaire, the interview, and the physical examination. Because the information was provided in large part by the  patient, it cannot be guaranteed that it has not been purposely or unconsciously manipulated. Every effort has been made to obtain as much relevant data as possible for this evaluation. It is important to note that the conclusions that lead to this procedure are derived in large part from the available data. Always take into account that the treatment will also be dependent on availability of resources and existing treatment guidelines, considered by other Pain Management Practitioners as being common knowledge and practice, at the time of the intervention. For Medico-Legal purposes, it is also important to point out that variation in procedural techniques and pharmacological choices are the acceptable norm. The indications, contraindications, technique, and results of the above procedure should only be interpreted and judged by a Board-Certified Interventional Pain Specialist with extensive familiarity and expertise in the same exact procedure and technique.

## 2017-05-17 NOTE — ED Notes (Signed)
Patient transported to X-ray 
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
## 2017-05-17 NOTE — ED Triage Notes (Addendum)
CP since Saturday. Cardiac hx. Pt reports elevated hypertension. Pt skin reddened. Pt alert and oriented X4, active, cooperative, pt in NAD. RR even and unlabored, color WNL.    States pain increased today, pressure to center of chest. Also c/o lower teeth hurting.

## 2017-05-17 NOTE — Progress Notes (Signed)
Nursing Pain Medication Assessment:  Safety precautions to be maintained throughout the outpatient stay will include: orient to surroundings, keep bed in low position, maintain call bell within reach at all times, provide assistance with transfer out of bed and ambulation.  Medication Inspection Compliance: Pill count conducted under aseptic conditions, in front of the patient. Neither the pills nor the bottle was removed from the patient's sight at any time. Once count was completed pills were immediately returned to the patient in their original bottle.  Medication: Tramadol (Ultram) Pill/Patch Count: 11 of 14 pills remain Pill/Patch Appearance: Markings consistent with prescribed medication Bottle Appearance: Standard pharmacy container. Clearly labeled. Filled Date: 05/12 / 2019 Last Medication intake:  Today   Contacted pharmacy, they dispensed only 14 pills on 05/15/17 because insurance only approved 1 week supply. Will submit PA request.

## 2017-05-17 NOTE — Telephone Encounter (Signed)
Patient called office to request appointment to be seen for elevated blood pressure. Patient states that he has been having intermittent headaches that he describes as severe. Patient states that on Saturday and Sunday he had episodes of chest pain that he states was on the right side of his chest. Patient states that " it felt like air was stuck in my chest." Patient denies symptoms of shortness of breath, numbleness, weakness, visual distrbances, nausea,

## 2017-05-17 NOTE — Patient Instructions (Signed)

## 2017-05-18 ENCOUNTER — Telehealth: Payer: Self-pay | Admitting: *Deleted

## 2017-05-18 ENCOUNTER — Encounter: Payer: Self-pay | Admitting: Family Medicine

## 2017-05-18 ENCOUNTER — Ambulatory Visit: Payer: Medicaid Other | Admitting: Family Medicine

## 2017-05-18 VITALS — BP 130/82 | HR 73 | Temp 97.4°F | Resp 20 | Wt 203.0 lb

## 2017-05-18 DIAGNOSIS — I10 Essential (primary) hypertension: Secondary | ICD-10-CM

## 2017-05-18 LAB — TROPONIN I: Troponin I: <0.03 ng/mL (ref <0.03)

## 2017-05-18 MED ORDER — NITROGLYCERIN 0.4 MG SL SUBL: 0.4000 mg | SUBLINGUAL_TABLET | SUBLINGUAL | 3 refills | Status: DC | PRN

## 2017-05-18 MED ORDER — LOSARTAN POTASSIUM 25 MG PO TABS
25.0000 mg | ORAL_TABLET | Freq: Every day | ORAL | 3 refills | Status: DC
Start: 2017-05-18 — End: 2017-06-02

## 2017-05-18 MED ORDER — BUTALBITAL-APAP-CAFFEINE 50-325-40 MG PO TABS
1.0000 | ORAL_TABLET | Freq: Once | ORAL | Status: AC
Start: 2017-05-18 — End: 2017-05-18
  Administered 2017-05-18: 1 via ORAL
  Filled 2017-05-18: qty 1

## 2017-05-18 NOTE — Progress Notes (Signed)
Patient: Caleb [name] Male    DOB: [birth year]   62 y.o.   MRN: [id number] Visit Date: 05/18/2017  Today's Provider: Lavon Paganini, MD   I, Martha Clan, CMA, am acting as scribe for Lavon Paganini, MD.  Chief Complaint  Patient presents with  . ER Follow Up   Subjective:    HPI     Follow up ER visit  Patient was seen in ER for atypical chest pain on 05/17/2017. He was treated for elevated BP, atypical chest pain, malaise. Treatment for this included checking labs and advising pt to FU with PCP.  Unable to tell from notes/chart if patient was given any antihypertensive in ED as BP is significantly improved from what it was overnight.    States pain has been stable and chronic.  Dealing with dad with dementia is stressful and difficult.  Has increased walking last week.   BP Readings from Last 3 Encounters:  05/18/17 130/82  05/18/17 (!) 149/74  05/17/17 (!) 163/103   Pt is still c/o headache and face flushing. He was advised by pain management to FU with PCP for elevated BP readings.   He is requesting a refill of Nitroglycerine.  Not taking the Viagra that was previously prescribed ------------------------------------------------------------------------------------    No Known Allergies   Current Outpatient Medications:  .  Adalimumab (HUMIRA PEN) 40 MG/0.4ML PNKT, Inject into the skin every 14 (fourteen) days., Disp: , Rfl:  .  albuterol (PROVENTIL HFA;VENTOLIN HFA) 108 (90 Base) MCG/ACT inhaler, Inhale 1-2 puffs every 6 (six) hours as needed into the lungs for wheezing or shortness of breath., Disp: 1 Inhaler, Rfl: 5 .  aspirin EC 81 MG tablet, Take 81 mg by mouth daily., Disp: , Rfl:  .  cyclobenzaprine (FLEXERIL) 10 MG tablet, Take 1 tablet (10 mg total) by mouth at bedtime., Disp: 30 tablet, Rfl: 2 .  fluticasone (FLOVENT HFA) 220 MCG/ACT inhaler, Inhale 1 puff 2 (two) times daily into the lungs., Disp: 1 Inhaler, Rfl: 5 .  gabapentin  (NEURONTIN) 300 MG capsule, Take 3 capsules (900 mg total) by mouth 4 (four) times daily., Disp: 360 capsule, Rfl: 2 .  hydrochlorothiazide (HYDRODIURIL) 25 MG tablet, Take 1 tablet (25 mg total) by mouth daily., Disp: 90 tablet, Rfl: 3 .  isosorbide mononitrate (IMDUR) 30 MG 24 hr tablet, TAKE 0.5 TABLET(S) EVERY DAY BY ORAL ROUTE IN THE MORNING., Disp: 15 tablet, Rfl: 5 .  metoprolol tartrate (LOPRESSOR) 25 MG tablet, TAKE 1 TABLET BY MOUTH TWICE A DAY, Disp: 60 tablet, Rfl: 2 .  rosuvastatin (CRESTOR) 40 MG tablet, TAKE 1 TABLET BY MOUTH EVERY DAY, Disp: 90 tablet, Rfl: 3 .  sildenafil (REVATIO) 20 MG tablet, Take 1 to 5 tabs PO daily prn, Disp: 30 tablet, Rfl: 6 .  tiotropium (SPIRIVA) 18 MCG inhalation capsule, Place 1 capsule (18 mcg total) daily into inhaler and inhale., Disp: 30 capsule, Rfl: 5 .  traMADol (ULTRAM) 50 MG tablet, Take 1 tablet (50 mg total) by mouth 2 (two) times daily., Disp: 60 tablet, Rfl: 2 .  triamcinolone ointment (KENALOG) 0.5 %, Apply 1 application 2 (two) times daily topically. On psoriasis lesions, Disp: 30 g, Rfl: 2 .  varenicline (CHANTIX CONTINUING MONTH PAK) 1 MG tablet, Take 1 tablet (1 mg total) by mouth 2 (two) times daily., Disp: 60 tablet, Rfl: 3  Review of Systems  Respiratory: Negative for shortness of breath.   Cardiovascular: Negative for chest pain, palpitations and leg swelling.  Gastrointestinal: Negative for nausea and vomiting.  Neurological: Positive for headaches.    Social History   Tobacco Use  . Smoking status: Current Every Day Smoker    Packs/day: 0.50    Years: 49.00    Pack years: 24.50    Types: Cigarettes  . Smokeless tobacco: Never Used  . Tobacco comment: started smoking at age ; has decreased cigarette use from 2 PPD to 0.5 PPD  Substance Use Topics  . Alcohol use: Yes    Alcohol/week: 2.4 oz    Types: 4 Cans of beer per week   Objective:   BP 130/82 (BP Location: Left Arm, Patient Position: Sitting, Cuff Size: Large)    Pulse 73   Temp (!) 97.4 F (36.3 C) (Oral)   Resp 20   Wt 203 lb (92.1 kg)   SpO2 94%   BMI 30.87 kg/m  Vitals:   05/18/17 0942  BP: 130/82  Pulse: 73  Resp: 20  Temp: (!) 97.4 F (36.3 C)  TempSrc: Oral  SpO2: 94%  Weight: 203 lb (92.1 kg)     Physical Exam  Constitutional: He is oriented to person, place, and time. He appears well-developed and well-nourished. No distress.  HENT:  Head: Normocephalic and atraumatic.  Eyes: Conjunctivae are normal. No scleral icterus.  Cardiovascular: Normal rate, regular rhythm, normal heart sounds and intact distal pulses.  No murmur heard. Pulmonary/Chest: Effort normal and breath sounds normal. No respiratory distress. He has no wheezes. He has no rales.  Musculoskeletal: He exhibits no edema.  Neurological: He is alert and oriented to person, place, and time. No cranial nerve deficit.  Skin: Skin is warm and dry. Capillary refill takes less than 2 seconds.  Psychiatric: He has a normal mood and affect. His behavior is normal.  Vitals reviewed.      Assessment & Plan:     Problem List Items Addressed This Visit      Cardiovascular and Mediastinum   Hypertension - Primary    At goal today, but significantly uncontrolled at several other locations recently Will add losartan to current regimen at low dose Has appt in 2wks with me, so we will follow-up at that time No red flag symptoms currently and had EKG and labs in ED last night - reviewed in EMR Return precautions discussed      Relevant Medications   losartan (COZAAR) 25 MG tablet   nitroGLYCERIN (NITROSTAT) 0.4 MG SL tablet      The entirety of the information documented in the History of Present Illness, Review of Systems and Physical Exam were personally obtained by me. Portions of this information were initially documented by Raquel Sarna Ratchford, CMA and reviewed by me for thoroughness and accuracy.    Virginia Crews, MD, MPH [facility] 05/18/2017 11:54 AM

## 2017-05-18 NOTE — Telephone Encounter (Signed)
Patient stated that he went to ED yesterday afternoon because of elevated blood pressure. States it was elevated prior to procedure and he was concerned when it didnt go down yesterday afternoon. He stated he had a slight HA and felt some facial flushing. Went to ED and they "checked him out". Has an appointment with PCP this morning. Instructed him to inform PCP that he had an injection here with steroid yesterday. Instructed to call us for any post procedure concerns.

## 2017-05-18 NOTE — Discharge Instructions (Signed)
It was a pleasure to take care of you today, and thank you for coming to our emergency department.  If you have any questions or concerns before leaving please ask the nurse to grab me and I'm more than happy to go through your aftercare instructions again.  If you were prescribed any opioid pain medication today such as Norco, Vicodin, Percocet, morphine, hydrocodone, or oxycodone please make sure you do not drive when you are taking this medication as it can alter your ability to drive safely.  If you have any concerns once you are home that you are not improving or are in fact getting worse before you can make it to your follow-up appointment, please do not hesitate to call 911 and come back for further evaluation.  Darel Hong, MD  Results for orders placed or performed during the hospital encounter of 69/67/89  Basic metabolic panel  Result Value Ref Range   Sodium 136 135 - 145 mmol/L   Potassium 3.8 3.5 - 5.1 mmol/L   Chloride 95 (L) 101 - 111 mmol/L   CO2 27 22 - 32 mmol/L   Glucose, Bld 227 (H) 65 - 99 mg/dL   BUN 18 6 - 20 mg/dL   Creatinine, Ser 0.78 0.61 - 1.24 mg/dL   Calcium 10.2 8.9 - 10.3 mg/dL   GFR calc non Af Amer >60 >60 mL/min   GFR calc Af Amer >60 >60 mL/min   Anion gap 14 5 - 15  CBC  Result Value Ref Range   WBC 14.2 (H) 3.8 - 10.6 K/uL   RBC 5.39 4.40 - 5.90 MIL/uL   Hemoglobin 16.8 13.0 - 18.0 g/dL   HCT 49.5 40.0 - 52.0 %   MCV 91.8 80.0 - 100.0 fL   MCH 31.1 26.0 - 34.0 pg   MCHC 33.9 32.0 - 36.0 g/dL   RDW 13.5 11.5 - 14.5 %   Platelets 269 150 - 440 K/uL  Troponin I  Result Value Ref Range   Troponin I <0.03 <0.03 ng/mL  Troponin I  Result Value Ref Range   Troponin I <0.03 <0.03 ng/mL   Dg Chest 2 View  Result Date: 05/17/2017 CLINICAL DATA:  Chest pain since Saturday. Hypertension. History of COPD, smoker. EXAM: CHEST - 2 VIEW COMPARISON:  Chest x-ray dated 10/10/2016. FINDINGS: The heart size and mediastinal contours are within normal  limits. Both lungs are clear. No pleural effusion or pneumothorax seen. The visualized skeletal structures are unremarkable. IMPRESSION: No active cardiopulmonary disease. No evidence of pneumonia or pulmonary edema. Electronically Signed   By: Franki Cabot M.D.   On: 05/17/2017 19:02   Dg C-arm 1-60 Min-no Report  Result Date: 05/17/2017 Fluoroscopy was utilized by the requesting physician.  No radiographic interpretation.

## 2017-05-18 NOTE — Assessment & Plan Note (Signed)
At goal today, but significantly uncontrolled at several other locations recently Will add losartan to current regimen at low dose Has appt in 2wks with me, so we will follow-up at that time No red flag symptoms currently and had EKG and labs in ED last night - reviewed in EMR Return precautions discussed

## 2017-05-18 NOTE — Patient Instructions (Addendum)
Do not take nitro with Viagra  Managing Your Hypertension Hypertension is commonly called high blood pressure. This is when the force of your blood pressing against the walls of your arteries is too strong. Arteries are blood vessels that carry blood from your heart throughout your body. Hypertension forces the heart to work harder to pump blood, and may cause the arteries to become narrow or stiff. Having untreated or uncontrolled hypertension can cause heart attack, stroke, kidney disease, and other problems. What are blood pressure readings? A blood pressure reading consists of a higher number over a lower number. Ideally, your blood pressure should be below 120/80. The first ("top") number is called the systolic pressure. It is a measure of the pressure in your arteries as your heart beats. The second ("bottom") number is called the diastolic pressure. It is a measure of the pressure in your arteries as the heart relaxes. What does my blood pressure reading mean? Blood pressure is classified into four stages. Based on your blood pressure reading, your health care provider may use the following stages to determine what type of treatment you need, if any. Systolic pressure and diastolic pressure are measured in a unit called mm Hg. Normal  Systolic pressure: below 237.  Diastolic pressure: below 80. Elevated  Systolic pressure: 628-315.  Diastolic pressure: below 80. Hypertension stage 1  Systolic pressure: 176-160.  Diastolic pressure: 73-71. Hypertension stage 2  Systolic pressure: 062 or above.  Diastolic pressure: 90 or above. What health risks are associated with hypertension? Managing your hypertension is an important responsibility. Uncontrolled hypertension can lead to:  A heart attack.  A stroke.  A weakened blood vessel (aneurysm).  Heart failure.  Kidney damage.  Eye damage.  Metabolic syndrome.  Memory and concentration problems.  What changes can I make  to manage my hypertension? Hypertension can be managed by making lifestyle changes and possibly by taking medicines. Your health care provider will help you make a plan to bring your blood pressure within a normal range. Eating and drinking  Eat a diet that is high in fiber and potassium, and low in salt (sodium), added sugar, and fat. An example eating plan is called the DASH (Dietary Approaches to Stop Hypertension) diet. To eat this way: ? Eat plenty of fresh fruits and vegetables. Try to fill half of your plate at each meal with fruits and vegetables. ? Eat whole grains, such as whole wheat pasta, brown rice, or whole grain bread. Fill about one quarter of your plate with whole grains. ? Eat low-fat diary products. ? Avoid fatty cuts of meat, processed or cured meats, and poultry with skin. Fill about one quarter of your plate with lean proteins such as fish, chicken without skin, beans, eggs, and tofu. ? Avoid premade and processed foods. These tend to be higher in sodium, added sugar, and fat.  Reduce your daily sodium intake. Most people with hypertension should eat less than 1,500 mg of sodium a day.  Limit alcohol intake to no more than 1 drink a day for nonpregnant women and 2 drinks a day for men. One drink equals 12 oz of beer, 5 oz of wine, or 1 oz of hard liquor. Lifestyle  Work with your health care provider to maintain a healthy body weight, or to lose weight. Ask what an ideal weight is for you.  Get at least 30 minutes of exercise that causes your heart to beat faster (aerobic exercise) most days of the week. Activities may include  walking, swimming, or biking.  Include exercise to strengthen your muscles (resistance exercise), such as weight lifting, as part of your weekly exercise routine. Try to do these types of exercises for 30 minutes at least 3 days a week.  Do not use any products that contain nicotine or tobacco, such as cigarettes and e-cigarettes. If you need help  quitting, ask your health care provider.  Control any long-term (chronic) conditions you have, such as high cholesterol or diabetes. Monitoring  Monitor your blood pressure at home as told by your health care provider. Your personal target blood pressure may vary depending on your medical conditions, your age, and other factors.  Have your blood pressure checked regularly, as often as told by your health care provider. Working with your health care provider  Review all the medicines you take with your health care provider because there may be side effects or interactions.  Talk with your health care provider about your diet, exercise habits, and other lifestyle factors that may be contributing to hypertension.  Visit your health care provider regularly. Your health care provider can help you create and adjust your plan for managing hypertension. Will I need medicine to control my blood pressure? Your health care provider may prescribe medicine if lifestyle changes are not enough to get your blood pressure under control, and if:  Your systolic blood pressure is 130 or higher.  Your diastolic blood pressure is 80 or higher.  Take medicines only as told by your health care provider. Follow the directions carefully. Blood pressure medicines must be taken as prescribed. The medicine does not work as well when you skip doses. Skipping doses also puts you at risk for problems. Contact a health care provider if:  You think you are having a reaction to medicines you have taken.  You have repeated (recurrent) headaches.  You feel dizzy.  You have swelling in your ankles.  You have trouble with your vision. Get help right away if:  You develop a severe headache or confusion.  You have unusual weakness or numbness, or you feel faint.  You have severe pain in your chest or abdomen.  You vomit repeatedly.  You have trouble breathing. Summary  Hypertension is when the force of blood  pumping through your arteries is too strong. If this condition is not controlled, it may put you at risk for serious complications.  Your personal target blood pressure may vary depending on your medical conditions, your age, and other factors. For most people, a normal blood pressure is less than 120/80.  Hypertension is managed by lifestyle changes, medicines, or both. Lifestyle changes include weight loss, eating a healthy, low-sodium diet, exercising more, and limiting alcohol. This information is not intended to replace advice given to you by your health care provider. Make sure you discuss any questions you have with your health care provider. Document Released: 09/15/2011 Document Revised: 11/19/2015 Document Reviewed: 11/19/2015 Elsevier Interactive Patient Education  [employer].

## 2017-05-20 DIAGNOSIS — G4733 Obstructive sleep apnea (adult) (pediatric): Secondary | ICD-10-CM | POA: Diagnosis not present

## 2017-05-23 ENCOUNTER — Telehealth: Payer: Self-pay | Admitting: Family Medicine

## 2017-05-23 NOTE — Telephone Encounter (Signed)
Pt is requesting a call back to discuss CPAP machine. Please advise. Thanks TNP

## 2017-05-24 NOTE — Telephone Encounter (Signed)
Pt states he has a mask with a "mouth piece", and he can not sleep with this mask. He is taking Chantix, which is causing dry mouth. With the mask, he wakes up several times per night to drink water. He is requesting an order for a nasal mask instead. Please review.

## 2017-05-25 NOTE — Telephone Encounter (Signed)
Form for [facility] signed and faxed back  Bacigalupo, Dionne Bucy, MD, MPH [facility] 05/25/2017 12:17 PM

## 2017-05-26 NOTE — Telephone Encounter (Signed)
Pt called stating that [facility] had not received this and were faxing over another copy. She thinks it was sent to the [place] fax number. She is faxing over another with her fax number that is local. If she will sign this and get this sent back pt has an appt at 3:00 today because he needs this and he is going out of town in the morning.

## 2017-05-27 NOTE — Telephone Encounter (Signed)
Faxed today

## 2017-05-31 ENCOUNTER — Ambulatory Visit: Payer: Self-pay | Admitting: Family Medicine

## 2017-06-01 ENCOUNTER — Ambulatory Visit: Payer: Self-pay | Admitting: Family Medicine

## 2017-06-02 ENCOUNTER — Encounter: Payer: Self-pay | Admitting: Family Medicine

## 2017-06-02 ENCOUNTER — Ambulatory Visit: Payer: Medicaid Other | Admitting: Family Medicine

## 2017-06-02 VITALS — BP 140/78 | HR 88 | Temp 98.1°F | Resp 16 | Wt 203.0 lb

## 2017-06-02 DIAGNOSIS — G4733 Obstructive sleep apnea (adult) (pediatric): Secondary | ICD-10-CM

## 2017-06-02 DIAGNOSIS — I10 Essential (primary) hypertension: Secondary | ICD-10-CM

## 2017-06-02 DIAGNOSIS — Z720 Tobacco use: Secondary | ICD-10-CM | POA: Diagnosis not present

## 2017-06-02 MED ORDER — LOSARTAN POTASSIUM 50 MG PO TABS: 50.0000 mg | ORAL_TABLET | Freq: Every day | ORAL | 5 refills | Status: DC

## 2017-06-02 NOTE — Assessment & Plan Note (Signed)
Chronic, improving Encourage patient on his cessation efforts and cutting back on cigarettes Continue Chantix Follow-up in 3 months

## 2017-06-02 NOTE — Assessment & Plan Note (Signed)
Slightly above goal today and uncontrolled on home readings Increase losartan to 50 mg daily Advised patient that the medication is unlikely to cause his headaches and that is likely caused by his uncontrolled blood pressure Recent BMP reviewed Continue other medications including metoprolol and HCTZ Follow-up in 3 months

## 2017-06-02 NOTE — Progress Notes (Signed)
Patient: Caleb [name] Male    DOB: 1955/06/03   62 y.o.   MRN: [id number] Visit Date: 06/02/2017  Today's Provider: Lavon Paganini, MD   I, Martha Clan, CMA, am acting as scribe for Lavon Paganini, MD.  Chief Complaint  Patient presents with  . Hypertension   Subjective:    HPI      Hypertension, follow-up:  BP Readings from Last 3 Encounters:  06/02/17 140/78  05/18/17 130/82  05/18/17 (!) 149/74    He was last seen for hypertension 2 weeks ago.  BP at that visit was 130/82. Management since that visit includes adding losartan 25 mg. He reports good compliance with treatment. He is having side effects. Headaches. He is exercising. Walking and yard work. He is not adherent to low salt diet. States he is sweating more, so is slightly increasing his salt intake. Outside blood pressures are fluctuating. Has been as high as 150/108 when checked with wrist cuff. States he has a headache when his BP is elevated. He is experiencing chest pain and fatigue.  Patient denies chest pressure/discomfort, claudication, dyspnea, exertional chest pressure/discomfort, irregular heart beat, lower extremity edema, near-syncope, orthopnea, palpitations and syncope.   Cardiovascular risk factors include advanced age (older than 83 for men, 67 for women), dyslipidemia, hypertension, male gender and smoking/ tobacco exposure.  Use of agents associated with hypertension: none.     Weight trend: stable Wt Readings from Last 3 Encounters:  06/02/17 203 lb (92.1 kg)  05/18/17 203 lb (92.1 kg)  05/17/17 202 lb (91.6 kg)   ------------------------------------------------------------------------ Tobacco use disorder:  Chantix is helping significantly with cravings.  Now down to 5 cigs/day.  States that cigarettes don't taste good anymore.  Plans to fully quit soon.    OSA: Has switched to nasal pillow mask from full face mask.  He feels as though mask is too tight in the  middle of the night and he takes it off.  He is working with a representative of [facility] to find a mask that fits him well that he can use throughout the night.  He does feel better in the mornings and that sleep has been more restorative when he does use his CPAP.    Allergies  Allergen Reactions  . Lisinopril Cough     Current Outpatient Medications:  .  Adalimumab (HUMIRA PEN) 40 MG/0.4ML PNKT, Inject into the skin every 14 (fourteen) days., Disp: , Rfl:  .  albuterol (PROVENTIL HFA;VENTOLIN HFA) 108 (90 Base) MCG/ACT inhaler, Inhale 1-2 puffs every 6 (six) hours as needed into the lungs for wheezing or shortness of breath., Disp: 1 Inhaler, Rfl: 5 .  aspirin EC 81 MG tablet, Take 81 mg by mouth daily., Disp: , Rfl:  .  cyclobenzaprine (FLEXERIL) 10 MG tablet, Take 1 tablet (10 mg total) by mouth at bedtime., Disp: 30 tablet, Rfl: 2 .  fluticasone (FLOVENT HFA) 220 MCG/ACT inhaler, Inhale 1 puff 2 (two) times daily into the lungs., Disp: 1 Inhaler, Rfl: 5 .  gabapentin (NEURONTIN) 300 MG capsule, Take 3 capsules (900 mg total) by mouth 4 (four) times daily., Disp: 360 capsule, Rfl: 2 .  hydrochlorothiazide (HYDRODIURIL) 25 MG tablet, Take 1 tablet (25 mg total) by mouth daily., Disp: 90 tablet, Rfl: 3 .  isosorbide mononitrate (IMDUR) 30 MG 24 hr tablet, TAKE 0.5 TABLET(S) EVERY DAY BY ORAL ROUTE IN THE MORNING., Disp: 15 tablet, Rfl: 5 .  losartan (COZAAR) 25 MG tablet, Take 1  tablet (25 mg total) by mouth daily., Disp: 30 tablet, Rfl: 3 .  metoprolol tartrate (LOPRESSOR) 25 MG tablet, TAKE 1 TABLET BY MOUTH TWICE A DAY, Disp: 60 tablet, Rfl: 2 .  nitroGLYCERIN (NITROSTAT) 0.4 MG SL tablet, Place 1 tablet (0.4 mg total) under the tongue every 5 (five) minutes as needed for chest pain., Disp: 30 tablet, Rfl: 3 .  rosuvastatin (CRESTOR) 40 MG tablet, TAKE 1 TABLET BY MOUTH EVERY DAY, Disp: 90 tablet, Rfl: 3 .  tiotropium (SPIRIVA) 18 MCG inhalation capsule, Place 1 capsule (18 mcg total)  daily into inhaler and inhale., Disp: 30 capsule, Rfl: 5 .  traMADol (ULTRAM) 50 MG tablet, Take 1 tablet (50 mg total) by mouth 2 (two) times daily., Disp: 60 tablet, Rfl: 2 .  triamcinolone ointment (KENALOG) 0.5 %, Apply 1 application 2 (two) times daily topically. On psoriasis lesions, Disp: 30 g, Rfl: 2 .  varenicline (CHANTIX CONTINUING MONTH PAK) 1 MG tablet, Take 1 tablet (1 mg total) by mouth 2 (two) times daily., Disp: 60 tablet, Rfl: 3  Review of Systems  Constitutional: Positive for fatigue. Negative for activity change, appetite change, chills, diaphoresis, fever and unexpected weight change.  Respiratory: Positive for apnea. Negative for shortness of breath.   Cardiovascular: Positive for chest pain. Negative for palpitations and leg swelling.    Social History   Tobacco Use  . Smoking status: Current Every Day Smoker    Packs/day: 0.50    Years: 49.00    Pack years: 24.50    Types: Cigarettes  . Smokeless tobacco: Never Used  . Tobacco comment: started smoking at age ; has decreased cigarette use from 2 PPD to 0.25 to 0.5 PPD  Substance Use Topics  . Alcohol use: Yes    Alcohol/week: 2.4 oz    Types: 4 Cans of beer per week   Objective:   BP 140/78 (BP Location: Left Arm, Patient Position: Sitting, Cuff Size: Large)   Pulse 88   Temp 98.1 F (36.7 C) (Oral)   Resp 16   Wt 203 lb (92.1 kg)   SpO2 96%   BMI 30.87 kg/m  Vitals:   06/02/17 0826  BP: 140/78  Pulse: 88  Resp: 16  Temp: 98.1 F (36.7 C)  TempSrc: Oral  SpO2: 96%  Weight: 203 lb (92.1 kg)     Physical Exam  Constitutional: He is oriented to person, place, and time. He appears well-developed and well-nourished. No distress.  HENT:  Head: Normocephalic and atraumatic.  Eyes: Conjunctivae are normal. No scleral icterus.  Neck: Neck supple. Normal carotid pulses present. Carotid bruit is not present. No thyromegaly present.  Cardiovascular: Normal rate, regular rhythm, normal heart sounds and  intact distal pulses.  No murmur heard. Pulmonary/Chest: Effort normal and breath sounds normal. No respiratory distress. He has no wheezes. He has no rales.  Musculoskeletal: He exhibits no edema.  Lymphadenopathy:    He has no cervical adenopathy.  Neurological: He is alert and oriented to person, place, and time.  Skin: Skin is warm and dry. Capillary refill takes less than 2 seconds.  Psychiatric: He has a normal mood and affect. His behavior is normal.  Vitals reviewed.       Assessment & Plan:     Problem List Items Addressed This Visit      Cardiovascular and Mediastinum   Hypertension - Primary    Slightly above goal today and uncontrolled on home readings Increase losartan to 50 mg daily Advised patient that  the medication is unlikely to cause his headaches and that is likely caused by his uncontrolled blood pressure Recent BMP reviewed Continue other medications including metoprolol and HCTZ Follow-up in 3 months      Relevant Medications   losartan (COZAAR) 50 MG tablet     Respiratory   OSA (obstructive sleep apnea)    Improving with use of CPAP and new mask Patient is importance of treating OSA especially with known CAD and hypertension Continue to work with representatives to find the right mask fit for him        Other   Tobacco abuse    Chronic, improving Encourage patient on his cessation efforts and cutting back on cigarettes Continue Chantix Follow-up in 3 months          Return in about 3 months (around 09/02/2017) for chronic disease f/u.   The entirety of the information documented in the History of Present Illness, Review of Systems and Physical Exam were personally obtained by me. Portions of this information were initially documented by Raquel Sarna Ratchford, CMA and reviewed by me for thoroughness and accuracy.    Virginia Crews, MD, MPH [facility] 06/02/2017 9:32 AM

## 2017-06-02 NOTE — Assessment & Plan Note (Signed)
Improving with use of CPAP and new mask Patient is importance of treating OSA especially with known CAD and hypertension Continue to work with representatives to find the right mask fit for him

## 2017-06-04 DIAGNOSIS — G4733 Obstructive sleep apnea (adult) (pediatric): Secondary | ICD-10-CM | POA: Diagnosis not present

## 2017-06-13 ENCOUNTER — Other Ambulatory Visit: Payer: Self-pay

## 2017-06-13 ENCOUNTER — Encounter: Payer: Self-pay | Admitting: Pain Medicine

## 2017-06-13 ENCOUNTER — Ambulatory Visit: Payer: Medicaid Other | Attending: Pain Medicine | Admitting: Pain Medicine

## 2017-06-13 VITALS — BP 118/81 | HR 76 | Temp 98.3°F | Resp 18 | Ht 68.0 in | Wt 200.0 lb

## 2017-06-13 DIAGNOSIS — M5412 Radiculopathy, cervical region: Secondary | ICD-10-CM | POA: Diagnosis not present

## 2017-06-13 DIAGNOSIS — M25571 Pain in right ankle and joints of right foot: Secondary | ICD-10-CM | POA: Diagnosis not present

## 2017-06-13 DIAGNOSIS — M4803 Spinal stenosis, cervicothoracic region: Secondary | ICD-10-CM | POA: Diagnosis not present

## 2017-06-13 DIAGNOSIS — I219 Acute myocardial infarction, unspecified: Secondary | ICD-10-CM | POA: Insufficient documentation

## 2017-06-13 DIAGNOSIS — Z7982 Long term (current) use of aspirin: Secondary | ICD-10-CM | POA: Insufficient documentation

## 2017-06-13 DIAGNOSIS — M47892 Other spondylosis, cervical region: Secondary | ICD-10-CM | POA: Diagnosis not present

## 2017-06-13 DIAGNOSIS — M7918 Myalgia, other site: Secondary | ICD-10-CM | POA: Insufficient documentation

## 2017-06-13 DIAGNOSIS — R7982 Elevated C-reactive protein (CRP): Secondary | ICD-10-CM | POA: Diagnosis not present

## 2017-06-13 DIAGNOSIS — M9981 Other biomechanical lesions of cervical region: Secondary | ICD-10-CM | POA: Diagnosis not present

## 2017-06-13 DIAGNOSIS — I2510 Atherosclerotic heart disease of native coronary artery without angina pectoris: Secondary | ICD-10-CM | POA: Diagnosis not present

## 2017-06-13 DIAGNOSIS — Z79899 Other long term (current) drug therapy: Secondary | ICD-10-CM | POA: Insufficient documentation

## 2017-06-13 DIAGNOSIS — M546 Pain in thoracic spine: Secondary | ICD-10-CM | POA: Diagnosis not present

## 2017-06-13 DIAGNOSIS — M79671 Pain in right foot: Secondary | ICD-10-CM

## 2017-06-13 DIAGNOSIS — M47819 Spondylosis without myelopathy or radiculopathy, site unspecified: Secondary | ICD-10-CM | POA: Insufficient documentation

## 2017-06-13 DIAGNOSIS — Z09 Encounter for follow-up examination after completed treatment for conditions other than malignant neoplasm: Secondary | ICD-10-CM | POA: Diagnosis present

## 2017-06-13 DIAGNOSIS — I10 Essential (primary) hypertension: Secondary | ICD-10-CM | POA: Insufficient documentation

## 2017-06-13 DIAGNOSIS — J449 Chronic obstructive pulmonary disease, unspecified: Secondary | ICD-10-CM | POA: Diagnosis not present

## 2017-06-13 DIAGNOSIS — R94130 Abnormal response to nerve stimulation, unspecified: Secondary | ICD-10-CM | POA: Diagnosis not present

## 2017-06-13 DIAGNOSIS — M5010 Cervical disc disorder with radiculopathy, unspecified cervical region: Secondary | ICD-10-CM | POA: Diagnosis not present

## 2017-06-13 DIAGNOSIS — E785 Hyperlipidemia, unspecified: Secondary | ICD-10-CM | POA: Diagnosis not present

## 2017-06-13 DIAGNOSIS — R700 Elevated erythrocyte sedimentation rate: Secondary | ICD-10-CM | POA: Diagnosis not present

## 2017-06-13 DIAGNOSIS — Z720 Tobacco use: Secondary | ICD-10-CM | POA: Insufficient documentation

## 2017-06-13 DIAGNOSIS — G8929 Other chronic pain: Secondary | ICD-10-CM | POA: Diagnosis not present

## 2017-06-13 DIAGNOSIS — Z79891 Long term (current) use of opiate analgesic: Secondary | ICD-10-CM | POA: Diagnosis not present

## 2017-06-13 DIAGNOSIS — M4722 Other spondylosis with radiculopathy, cervical region: Secondary | ICD-10-CM

## 2017-06-13 DIAGNOSIS — G4733 Obstructive sleep apnea (adult) (pediatric): Secondary | ICD-10-CM | POA: Insufficient documentation

## 2017-06-13 DIAGNOSIS — L400 Psoriasis vulgaris: Secondary | ICD-10-CM | POA: Insufficient documentation

## 2017-06-13 DIAGNOSIS — M5030 Other cervical disc degeneration, unspecified cervical region: Secondary | ICD-10-CM

## 2017-06-13 DIAGNOSIS — N529 Male erectile dysfunction, unspecified: Secondary | ICD-10-CM | POA: Diagnosis not present

## 2017-06-13 DIAGNOSIS — M79602 Pain in left arm: Secondary | ICD-10-CM

## 2017-06-13 DIAGNOSIS — Z7952 Long term (current) use of systemic steroids: Secondary | ICD-10-CM | POA: Insufficient documentation

## 2017-06-13 DIAGNOSIS — G894 Chronic pain syndrome: Secondary | ICD-10-CM | POA: Diagnosis not present

## 2017-06-13 DIAGNOSIS — M4802 Spinal stenosis, cervical region: Secondary | ICD-10-CM

## 2017-06-13 DIAGNOSIS — Z7984 Long term (current) use of oral hypoglycemic drugs: Secondary | ICD-10-CM | POA: Insufficient documentation

## 2017-06-13 NOTE — Patient Instructions (Addendum)
____________________________________________________________________________________________  Preparing for Procedure with Sedation  Instructions: . Oral Intake: Do not eat or drink anything for at least 8 hours prior to your procedure. . Transportation: Public transportation is not allowed. Bring an adult driver. The driver must be physically present in our waiting room before any procedure can be started. [facility] Physical Assistance: Bring an adult physically capable of assisting you, in the event you need help. This adult should keep you company at home for at least 6 hours after the procedure. . Blood Pressure Medicine: Take your blood pressure medicine with a sip of water the morning of the procedure. . Blood thinners:  . Diabetics on insulin: Notify the staff so that you can be scheduled 1st case in the morning. If your diabetes requires high dose insulin, take only  of your normal insulin dose the morning of the procedure and notify the staff that you have done so. . Preventing infections: Shower with an antibacterial soap the morning of your procedure. . Build-up your immune system: Take 1000 mg of Vitamin C with every meal (3 times a day) the day prior to your procedure. [facility] Antibiotics: Inform the staff if you have a condition or reason that requires you to take antibiotics before dental procedures. . Pregnancy: If you are pregnant, call and cancel the procedure. . Sickness: If you have a cold, fever, or any active infections, call and cancel the procedure. . Arrival: You must be in the facility at least 30 minutes prior to your scheduled procedure. . Children: Do not bring children with you. . Dress appropriately: Bring dark clothing that you would not mind if they get stained. . Valuables: Do not bring any jewelry or valuables.  Procedure appointments are reserved for interventional treatments only. [facility] No Prescription Refills. . No medication changes will be discussed during procedure  appointments. . No disability issues will be discussed.  Remember:  Regular Business hours are:  Monday to Thursday 8:00 AM to 4:00 PM  Provider's Schedule: Milinda Pointer, MD:  Procedure days: Tuesday and Thursday 7:30 AM to 4:00 PM  Gillis Santa, MD:  Procedure days: Monday and Wednesday 7:30 AM to 4:00 PM ____________________________________________________________________________________________   Epidural Steroid Injection Patient Information  Description: The epidural space surrounds the nerves as they exit the spinal cord.  In some patients, the nerves can be compressed and inflamed by a bulging disc or a tight spinal canal (spinal stenosis).  By injecting steroids into the epidural space, we can bring irritated nerves into direct contact with a potentially helpful medication.  These steroids act directly on the irritated nerves and can reduce swelling and inflammation which often leads to decreased pain.  Epidural steroids may be injected anywhere along the spine and from the neck to the low back depending upon the location of your pain.   After numbing the skin with local anesthetic (like Novocaine), a small needle is passed into the epidural space slowly.  You may experience a sensation of pressure while this is being done.  The entire block usually last less than 10 minutes.  Conditions which may be treated by epidural steroids:   Low back and leg pain  Neck and arm pain  Spinal stenosis  Post-laminectomy syndrome  Herpes zoster (shingles) pain  Pain from compression fractures  Preparation for the injection:  1. Do not eat any solid food or dairy products within 8 hours of your appointment.  2. You may drink clear liquids up to 3 hours before appointment.  Clear  liquids include water, black coffee, juice or soda.  No milk or cream please. 3. You may take your regular medication, including pain medications, with a sip of water before your appointment  Diabetics  should hold regular insulin (if taken separately) and take 1/2 normal NPH dos the morning of the procedure.  Carry some sugar containing items with you to your appointment. 4. A driver must accompany you and be prepared to drive you home after your procedure.  5. Bring all your current medications with your. 6. An IV may be inserted and sedation may be given at the discretion of the physician.   7. A blood pressure cuff, EKG and other monitors will often be applied during the procedure.  Some patients may need to have extra oxygen administered for a short period. 8. You will be asked to provide medical information, including your allergies, prior to the procedure.  We must know immediately if you are taking blood thinners (like Coumadin/Warfarin)  Or if you are allergic to IV iodine contrast (dye). We must know if you could possible be pregnant.  Possible side-effects:  Bleeding from needle site  Infection (rare, may require surgery)  Nerve injury (rare)  Numbness & tingling (temporary)  Difficulty urinating (rare, temporary)  Spinal headache ( a headache worse with upright posture)  Light -headedness (temporary)  Pain at injection site (several days)  Decreased blood pressure (temporary)  Weakness in arm/leg (temporary)  Pressure sensation in back/neck (temporary)  Call if you experience:  Fever/chills associated with headache or increased back/neck pain.  Headache worsened by an upright position.  New onset weakness or numbness of an extremity below the injection site  Hives or difficulty breathing (go to the emergency room)  Inflammation or drainage at the infection site  Severe back/neck pain  Any new symptoms which are concerning to you  Please note:  Although the local anesthetic injected can often make your back or neck feel good for several hours after the injection, the pain will likely return.  It takes 3-7 days for steroids to work in the epidural space.   You may not notice any pain relief for at least that one week.  If effective, we will often do a series of three injections spaced 3-6 weeks apart to maximally decrease your pain.  After the initial series, we generally will wait several months before considering a repeat injection of the same type.  If you have any questions, please call [phone] [facility]

## 2017-06-13 NOTE — Progress Notes (Addendum)
Patient's Name: Caleb [name]  MRN: [id number]  Referring Provider: Virginia Crews, MD  DOB: 10/14/1955  PCP: Virginia Crews, MD  DOS: 06/13/2017  Note by: Gaspar Cola, MD  Service setting: Ambulatory outpatient  Specialty: Interventional Pain Management  Location: [facility] (AMB) Pain Management Facility    Patient type: Established   Primary Reason(s) for Visit: Encounter for post-procedure evaluation of chronic illness with mild to moderate exacerbation CC: Neck Pain and Arm Pain (left to elbow)  HPI  [name] is a 62 y.o. year old, male patient, who comes today for a post-procedure evaluation. He has OSA (obstructive sleep apnea); Myocardial infarction ([place]); Hypertension; Hyperlipidemia; COPD (chronic obstructive pulmonary disease) ([place]); Chronic foot pain (Primary Area of Pain) (Right); Tobacco abuse; Erectile dysfunction; CAD (coronary artery disease), native coronary artery; Chronic ankle pain (Secondary Area of Pain) (Right); Chronic thoracic back pain [facility] Area of Pain) (Midline); Chronic pain syndrome; Long term current use of opiate analgesic; Pharmacologic therapy; Disorder of skeletal system; Problems influencing health status; Elevated C-reactive protein (CRP); Elevated sed rate; 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); Spondylosis without myelopathy or radiculopathy, cervicothoracic region; Chronic musculoskeletal pain; Neurogenic foot pain (Right); DDD (degenerative disc disease), cervical; 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 His primarily concern today is the  Neck Pain and Arm Pain (left to elbow)  Pain Assessment: Location:   Neck Radiating: left arm- upper Onset: More than a month ago Duration: Chronic pain Quality: Burning, Constant Severity: 2 /10 (subjective, self-reported pain score)  Note: Reported level is compatible with observation.                         When using our objective Pain Scale, levels between 6 and 10/10 are said to belong in an emergency room, as it progressively worsens from a 6/10, described as severely limiting, requiring emergency care not usually available at an outpatient pain management facility. At a 6/10 level, communication becomes difficult and requires great effort. Assistance to reach the emergency department may be required. Facial flushing and profuse sweating along with potentially dangerous increases in heart rate and blood pressure will be evident. Timing: Constant Modifying factors: procedures, positioning,  BP: 118/81  HR: 76  [name] comes in today for post-procedure evaluation after the treatment done on 05/18/2017.  Further details on both, my assessment(s), as well as the proposed treatment plan, please see below.  Post-Procedure Assessment  05/17/2017 Procedure: Diagnostic left sided cervical epidural steroid injection #1 under fluoroscopic guidance and IV sedation Pre-procedure pain score:  5/10 Post-procedure pain score: 0/10 (100% relief) Influential Factors: BMI: 30.41 kg/m Intra-procedural challenges: None observed.         Assessment challenges: None detected.              Reported side-effects: None.        Post-procedural adverse reactions or complications: None reported         Sedation: Sedation provided. When no sedatives are used, the analgesic levels obtained are directly associated to the effectiveness of the local anesthetics. However, when sedation is provided, the level of analgesia obtained during the initial 1 hour following the intervention, is believed  to be the result  of a combination of factors. These factors may include, but are not limited to: 1. The effectiveness of the local anesthetics used. 2. The effects of the analgesic(s) and/or anxiolytic(s) used. 3. The degree of discomfort experienced by the patient at the time of the procedure. 4. The patients ability and reliability in recalling and recording the events. 5. The presence and influence of possible secondary gains and/or psychosocial factors. Reported result: Relief experienced during the 1st hour after the procedure: 100 % (Ultra-Short Term Relief)            Interpretative annotation: Clinically appropriate result. Analgesia during this period is likely to be Local Anesthetic and/or IV Sedative (Analgesic/Anxiolytic) related.          Effects of local anesthetic: The analgesic effects attained during this period are directly associated to the localized infiltration of local anesthetics and therefore cary significant diagnostic value as to the etiological location, or anatomical origin, of the pain. Expected duration of relief is directly dependent on the pharmacodynamics of the local anesthetic used. Long-acting (4-6 hours) anesthetics used.  Reported result: Relief during the next 4 to 6 hour after the procedure: 100 % (Short-Term Relief)            Interpretative annotation: Clinically appropriate result. Analgesia during this period is likely to be Local Anesthetic-related.          Long-term benefit: Defined as the period of time past the expected duration of local anesthetics (1 hour for short-acting and 4-6 hours for long-acting). With the possible exception of prolonged sympathetic blockade from the local anesthetics, benefits during this period are typically attributed to, or associated with, other factors such as analgesic sensory neuropraxia, antiinflammatory effects, or beneficial biochemical changes provided by agents other than the local anesthetics.  Reported result: Extended relief  following procedure: 100 %(for a week, then it slowly returned but continues to get relief with left arm pain.) (Long-Term Relief)            Interpretative annotation: Clinically appropriate result. Good relief. Therapeutic success. Inflammation plays a part in the etiology to the pain. Benefit believed to be steroid-related.  Current benefits: Defined as reported results that persistent at this point in time.   Analgesia: 75 % Mr. Meek reports improvement of extremity symptoms. Function: Mr. Boy reports improvement in function ROM: Mr. Jamar reports improvement in ROM Interpretative annotation: Ongoing benefit. Therapeutic benefit observed. Effective therapeutic approach. Benefit could be steroid-related.  Interpretation: Results would suggest a successful diagnostic intervention.                  Plan:  Proceed with treatment No.: 2          Laboratory Chemistry  Inflammation Markers (CRP: Acute Phase) (ESR: Chronic Phase) Lab Results  Component Value Date   CRP 7.3 (H) 02/17/2017   ESRSEDRATE 38 (H) 02/17/2017                         Renal Markers Lab Results  Component Value Date   BUN 18 05/17/2017   CREATININE 0.78 05/17/2017   BCR 22 (H) 02/17/2017   GFRAA >60 05/17/2017   GFRNONAA >60 05/17/2017                             Hepatic Markers Lab Results  Component Value Date   AST 19 02/17/2017   ALT 13  10/21/2016   ALBUMIN 4.4 02/17/2017                        Neuropathy Markers Lab Results  Component Value Date   HIV Non Reactive 02/04/2017                        Hematology Parameters Lab Results  Component Value Date   PLT 269 05/17/2017   HGB 16.8 05/17/2017   HCT 49.5 05/17/2017                        CV Markers Lab Results  Component Value Date   TROPONINI <0.03 05/17/2017                         Note: Lab results reviewed.  Recent Diagnostic Imaging Results  DG Chest 2 View CLINICAL DATA:  Chest pain since Saturday. Hypertension.  History of COPD, smoker.  EXAM: CHEST - 2 VIEW  COMPARISON:  Chest x-ray dated 10/10/2016.  FINDINGS: The heart size and mediastinal contours are within normal limits. Both lungs are clear. No pleural effusion or pneumothorax seen. The visualized skeletal structures are unremarkable.  IMPRESSION: No active cardiopulmonary disease. No evidence of pneumonia or pulmonary edema.  Electronically Signed   By: Franki Cabot M.D.   On: 05/17/2017 19:02 DG C-Arm 1-60 Min-No Report Fluoroscopy was utilized by the requesting physician.  No radiographic  interpretation.   Complexity Note: I personally reviewed the fluoroscopic imaging of the procedure.                        Meds   Current Outpatient Medications:  .  Adalimumab (HUMIRA PEN) 40 MG/0.4ML PNKT, Inject into the skin every 14 (fourteen) days., Disp: , Rfl:  .  albuterol (PROVENTIL HFA;VENTOLIN HFA) 108 (90 Base) MCG/ACT inhaler, Inhale 1-2 puffs every 6 (six) hours as needed into the lungs for wheezing or shortness of breath., Disp: 1 Inhaler, Rfl: 5 .  aspirin EC 81 MG tablet, Take 81 mg by mouth daily., Disp: , Rfl:  .  cyclobenzaprine (FLEXERIL) 10 MG tablet, Take 1 tablet (10 mg total) by mouth at bedtime., Disp: 30 tablet, Rfl: 2 .  fluticasone (FLOVENT HFA) 220 MCG/ACT inhaler, Inhale 1 puff 2 (two) times daily into the lungs., Disp: 1 Inhaler, Rfl: 5 .  gabapentin (NEURONTIN) 300 MG capsule, Take 3 capsules (900 mg total) by mouth 4 (four) times daily., Disp: 360 capsule, Rfl: 2 .  hydrochlorothiazide (HYDRODIURIL) 25 MG tablet, Take 1 tablet (25 mg total) by mouth daily., Disp: 90 tablet, Rfl: 3 .  isosorbide mononitrate (IMDUR) 30 MG 24 hr tablet, TAKE 0.5 TABLET(S) EVERY DAY BY ORAL ROUTE IN THE MORNING., Disp: 15 tablet, Rfl: 5 .  losartan (COZAAR) 50 MG tablet, Take 1 tablet (50 mg total) by mouth daily., Disp: 30 tablet, Rfl: 5 .  metoprolol tartrate (LOPRESSOR) 25 MG tablet, TAKE 1 TABLET BY MOUTH TWICE A DAY,  Disp: 60 tablet, Rfl: 2 .  nitroGLYCERIN (NITROSTAT) 0.4 MG SL tablet, Place 1 tablet (0.4 mg total) under the tongue every 5 (five) minutes as needed for chest pain., Disp: 30 tablet, Rfl: 3 .  rosuvastatin (CRESTOR) 40 MG tablet, TAKE 1 TABLET BY MOUTH EVERY DAY, Disp: 90 tablet, Rfl: 3 .  tiotropium (SPIRIVA) 18 MCG inhalation capsule, Place 1 capsule (18 mcg total) daily  into inhaler and inhale., Disp: 30 capsule, Rfl: 5 .  traMADol (ULTRAM) 50 MG tablet, Take 1 tablet (50 mg total) by mouth 2 (two) times daily., Disp: 60 tablet, Rfl: 2 .  triamcinolone ointment (KENALOG) 0.5 %, Apply 1 application 2 (two) times daily topically. On psoriasis lesions, Disp: 30 g, Rfl: 2 .  varenicline (CHANTIX CONTINUING MONTH PAK) 1 MG tablet, Take 1 tablet (1 mg total) by mouth 2 (two) times daily., Disp: 60 tablet, Rfl: 3  ROS  Constitutional: Denies any fever or chills Gastrointestinal: No reported hemesis, hematochezia, vomiting, or acute GI distress Musculoskeletal: Denies any acute onset joint swelling, redness, loss of ROM, or weakness Neurological: No reported episodes of acute onset apraxia, aphasia, dysarthria, agnosia, amnesia, paralysis, loss of coordination, or loss of consciousness  Allergies  Mr. Veron is allergic to lisinopril.  [place]  Drug: Mr. Emert  reports that he does not use drugs. Alcohol:  reports that he drinks about 2.4 oz of alcohol per week. Tobacco:  reports that he has been smoking cigarettes.  He has a 24.50 pack-year smoking history. He has never used smokeless tobacco. Medical:  has a past medical history of CHF (congestive heart failure) ([place]), Chronic foot pain, right (2015), COPD (chronic obstructive pulmonary disease) ([place]), Coronary artery disease, Hyperlipidemia, Hypertension, Myocardial infarction ([place]) (2015), and OSA on CPAP. Surgical: Mr. Renwick  has a past surgical history that includes Cardiac catheterization (2015); Lumbar laminectomy (1989, 1999); Cervical  fusion (1988, 1998); Liver surgery (2015); Partial colectomy (1990); Inguinal hernia repair (Bilateral, 1975); Lithotripsy; and Colonoscopy with propofol (N/A, 02/24/2017). Family: family history includes Alzheimer's disease in his paternal grandmother; Alzheimer's disease (age of onset: 56) in his father; Breast cancer in his maternal uncle; CAD in his mother; Diabetes in his maternal grandmother; Healthy in his sister; Heart attack in his maternal uncle; Heart failure in his maternal grandmother; Heart failure (age of onset: 51) in his mother; Non-Hodgkin's lymphoma in his sister.  Constitutional Exam  General appearance: Well nourished, well developed, and well hydrated. In no apparent acute distress Vitals:   06/13/17 0920  BP: 118/81  Pulse: 76  Resp: 18  Temp: 98.3 F (36.8 C)  TempSrc: Oral  SpO2: 99%  Weight: 200 lb (90.7 kg)  Height: '5\' 8"'$  (1.727 m)   BMI Assessment: Estimated body mass index is 30.41 kg/m as calculated from the following:   Height as of this encounter: '5\' 8"'$  (1.727 m).   Weight as of this encounter: 200 lb (90.7 kg).  BMI interpretation table: BMI level Category Range association with higher incidence of chronic pain  <18 kg/m2 Underweight   18.5-24.9 kg/m2 Ideal body weight   25-29.9 kg/m2 Overweight Increased incidence by 20%  30-34.9 kg/m2 Obese (Class I) Increased incidence by 68%  35-39.9 kg/m2 Severe obesity (Class II) Increased incidence by 136%  >40 kg/m2 Extreme obesity (Class III) Increased incidence by 254%   Patient's current BMI Ideal Body weight  Body mass index is 30.41 kg/m. Ideal body weight: 68.4 kg (150 lb 12.7 oz) Adjusted ideal body weight: 77.3 kg (170 lb 7.6 oz)   BMI Readings from Last 4 Encounters:  06/13/17 30.41 kg/m  06/02/17 30.87 kg/m  05/18/17 30.87 kg/m  05/17/17 30.71 kg/m   Wt Readings from Last 4 Encounters:  06/13/17 200 lb (90.7 kg)  06/02/17 203 lb (92.1 kg)  05/18/17 203 lb (92.1 kg)  05/17/17 202 lb  (91.6 kg)  Psych/Mental status: Alert, oriented x 3 (person, place, & time)  Eyes: PERLA Respiratory: No evidence of acute respiratory distress  Cervical Spine Area Exam  Skin & Axial Inspection: No masses, redness, edema, swelling, or associated skin lesions Alignment: Symmetrical Functional ROM: Decreased ROM      Stability: No instability detected Muscle Tone/Strength: Guarding observed Sensory (Neurological): Movement-associated pain Palpation: Complains of area being tender to palpation              Upper Extremity (UE) Exam    Side: Right upper extremity  Side: Left upper extremity  Skin & Extremity Inspection: Skin color, temperature, and hair growth are WNL. No peripheral edema or cyanosis. No masses, redness, swelling, asymmetry, or associated skin lesions. No contractures.  Skin & Extremity Inspection: Skin color, temperature, and hair growth are WNL. No peripheral edema or cyanosis. No masses, redness, swelling, asymmetry, or associated skin lesions. No contractures.  Functional ROM: Unrestricted ROM          Functional ROM: Unrestricted ROM          Muscle Tone/Strength: Functionally intact. No obvious neuro-muscular anomalies detected.  Muscle Tone/Strength: Functionally intact. No obvious neuro-muscular anomalies detected.  Sensory (Neurological): Unimpaired          Sensory (Neurological): Unimpaired          Palpation: No palpable anomalies              Palpation: No palpable anomalies              Provocative Test(s):  Phalen's test: deferred Tinel's test: deferred Apley's scratch test (touch opposite shoulder):  Action 1 (Across chest): deferred Action 2 (Overhead): deferred Action 3 (LB reach): deferred   Provocative Test(s):  Phalen's test: deferred Tinel's test: deferred Apley's scratch test (touch opposite shoulder):  Action 1 (Across chest): deferred Action 2 (Overhead): deferred Action 3 (LB reach): deferred    Thoracic Spine Area Exam  Skin & Axial  Inspection: No masses, redness, or swelling Alignment: Symmetrical Functional ROM: Unrestricted ROM Stability: No instability detected Muscle Tone/Strength: Functionally intact. No obvious neuro-muscular anomalies detected. Sensory (Neurological): Unimpaired Muscle strength & Tone: No palpable anomalies  Lumbar Spine Area Exam  Skin & Axial Inspection: No masses, redness, or swelling Alignment: Symmetrical Functional ROM: Unrestricted ROM       Stability: No instability detected Muscle Tone/Strength: Functionally intact. No obvious neuro-muscular anomalies detected. Sensory (Neurological): Unimpaired Palpation: No palpable anomalies       Provocative Tests: Lumbar Hyperextension/rotation test: deferred today       Lumbar quadrant test (Kemp's test): deferred today       Lumbar Lateral bending test: deferred today       Patrick's Maneuver: deferred today                   FABER test: deferred today       Thigh-thrust test: deferred today       S-I compression test: deferred today       S-I distraction test: deferred today        Gait & Posture Assessment  Ambulation: Unassisted Gait: Relatively normal for age and body habitus Posture: WNL   Lower Extremity Exam    Side: Right lower extremity  Side: Left lower extremity  Stability: No instability observed          Stability: No instability observed          Skin & Extremity Inspection: Skin color, temperature, and hair growth are WNL. No peripheral edema or cyanosis. No masses, redness, swelling, asymmetry, or  associated skin lesions. No contractures.  Skin & Extremity Inspection: Skin color, temperature, and hair growth are WNL. No peripheral edema or cyanosis. No masses, redness, swelling, asymmetry, or associated skin lesions. No contractures.  Functional ROM: Unrestricted ROM                  Functional ROM: Unrestricted ROM                  Muscle Tone/Strength: Functionally intact. No obvious neuro-muscular anomalies  detected.  Muscle Tone/Strength: Functionally intact. No obvious neuro-muscular anomalies detected.  Sensory (Neurological): Unimpaired  Sensory (Neurological): Unimpaired  Palpation: No palpable anomalies  Palpation: No palpable anomalies   Assessment  Primary Diagnosis & Pertinent Problem List: The primary encounter diagnosis was Chronic pain of left upper extremity. 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  Status Diagnosis  Improving Improving Stable 1. Chronic pain of left upper extremity   2. Cervical spondylosis with radiculopathy   3. DDD (degenerative disc disease), cervical   4. 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 Chronic thoracic back pain [facility] Area of Pain) (Midline)   6. Chronic foot pain (Primary Area of Pain) (Right)   7. Chronic ankle pain (Secondary Area of Pain) (Right)   8. Abnormal nerve conduction studies (06/08/2017)   9. Chronic cervical radiculopathy (Left)     Problems updated and reviewed during this visit: Problem  Abnormal nerve conduction studies (06/08/2017)   EMG/PNCV conducted by Dr. Gurney [facility] neurology). Impression: Abnormal distally. There is electrodiagnostic evidence of a chronic, moderately severe left mid to lower Cervical Polyradiculopathy.   Chronic cervical radiculopathy (Left)   Plan of Care  Pharmacotherapy (Medications Ordered): No orders of the defined types were placed in this encounter.  Medications administered today: Webb L. Laspina had no medications administered during  this visit.   Procedure Orders     Cervical Epidural Injection Lab Orders  No laboratory test(s) ordered today   Imaging Orders  No imaging studies ordered today   Referral Orders  No referral(s) requested today    Interventional management options: Planned, scheduled, and/or pending:   Therapeutic Right Common Peroneal Nerve RFA (06/21/17) Diagnostic left sided cervical epidural steroid injection #2 under fluoroscopic guidance and IV sedation   Considering:   Diagnostic right ankle block Diagnosticright lumbar sympathetic block Diagnostic midlinethoracic epidural steroid injection Diagnostic bilateral thoracic facet block Possible bilateral thoracic facet RFA Diagnostic cervical epidural steroid injection Diagnostic bilateral cervical facet block Possible bilateral cervical facet RFA   Palliative PRN treatment(s):   None at this time   Provider-requested follow-up: Return for Procedure (w/ sedation): (L) CESI #2.  Future Appointments  Date Time Provider [place]  06/16/2017  1:00 PM Milinda Pointer, MD [facility] None  06/21/2017  2:15 PM Milinda Pointer, MD [facility] None  09/02/2017  8:20 AM Bacigalupo, Dionne Bucy, MD [facility] None   Primary Care Physician: Virginia Crews, MD Location: [facility] Outpatient Pain Management Facility Note by: Gaspar Cola, MD Date: 06/13/2017; Time: 10:05 AM

## 2017-06-13 NOTE — Progress Notes (Signed)
Nursing Pain Medication Assessment:  Safety precautions to be maintained throughout the outpatient stay will include: orient to surroundings, keep bed in low position, maintain call bell within reach at all times, provide assistance with transfer out of bed and ambulation.  Medication Inspection Compliance: Pill count conducted under aseptic conditions, in front of the patient. Neither the pills nor the bottle was removed from the patient's sight at any time. Once count was completed pills were immediately returned to the patient in their original bottle.  Medication: Tramadol (Ultram) Pill/Patch Count: 19 of 60 pills remain Pill/Patch Appearance: Markings consistent with prescribed medication Bottle Appearance: Standard pharmacy container. Clearly labeled. Filled Date: 05 / 19 / 2019 Last Medication intake:  TodaySafety precautions to be maintained throughout the outpatient stay will include: orient to surroundings, keep bed in low position, maintain call bell within reach at all times, provide assistance with transfer out of bed and ambulation.

## 2017-06-16 ENCOUNTER — Other Ambulatory Visit: Payer: Self-pay

## 2017-06-16 ENCOUNTER — Encounter: Payer: Self-pay | Admitting: Pain Medicine

## 2017-06-16 ENCOUNTER — Ambulatory Visit: Payer: Medicaid Other | Admitting: Pain Medicine

## 2017-06-16 ENCOUNTER — Ambulatory Visit
Admission: RE | Admit: 2017-06-16 | Discharge: 2017-06-16 | Disposition: A | Discharge status: Home / Self Care | Payer: Medicaid Other | Source: Ambulatory Visit | Attending: Pain Medicine | Admitting: Pain Medicine

## 2017-06-16 VITALS — BP 119/86 | HR 71 | Temp 97.7°F | Resp 22

## 2017-06-16 DIAGNOSIS — M4803 Spinal stenosis, cervicothoracic region: Secondary | ICD-10-CM | POA: Diagnosis not present

## 2017-06-16 DIAGNOSIS — M5010 Cervical disc disorder with radiculopathy, unspecified cervical region: Secondary | ICD-10-CM | POA: Insufficient documentation

## 2017-06-16 DIAGNOSIS — M5030 Other cervical disc degeneration, unspecified cervical region: Secondary | ICD-10-CM | POA: Insufficient documentation

## 2017-06-16 DIAGNOSIS — R29898 Other symptoms and signs involving the musculoskeletal system: Secondary | ICD-10-CM

## 2017-06-16 DIAGNOSIS — M4722 Other spondylosis with radiculopathy, cervical region: Secondary | ICD-10-CM | POA: Diagnosis not present

## 2017-06-16 DIAGNOSIS — R94130 Abnormal response to nerve stimulation, unspecified: Secondary | ICD-10-CM | POA: Insufficient documentation

## 2017-06-16 DIAGNOSIS — R531 Weakness: Secondary | ICD-10-CM | POA: Diagnosis not present

## 2017-06-16 DIAGNOSIS — M542 Cervicalgia: Secondary | ICD-10-CM

## 2017-06-16 DIAGNOSIS — M4802 Spinal stenosis, cervical region: Secondary | ICD-10-CM | POA: Diagnosis not present

## 2017-06-16 DIAGNOSIS — M47892 Other spondylosis, cervical region: Secondary | ICD-10-CM | POA: Insufficient documentation

## 2017-06-16 DIAGNOSIS — M5412 Radiculopathy, cervical region: Secondary | ICD-10-CM | POA: Insufficient documentation

## 2017-06-16 DIAGNOSIS — M9981 Other biomechanical lesions of cervical region: Secondary | ICD-10-CM | POA: Diagnosis not present

## 2017-06-16 MED ORDER — LACTATED RINGERS IV SOLN
1000.0000 mL | Freq: Once | INTRAVENOUS | Status: AC
  Administered 2017-06-16: 1000 mL via INTRAVENOUS

## 2017-06-16 MED ORDER — DEXAMETHASONE SODIUM PHOSPHATE 10 MG/ML IJ SOLN
INTRAMUSCULAR | Status: AC
  Filled 2017-06-16: qty 1

## 2017-06-16 MED ORDER — FENTANYL CITRATE (PF) 100 MCG/2ML IJ SOLN
25.0000 ug | INTRAMUSCULAR | Status: DC | PRN
  Administered 2017-06-16: 100 ug via INTRAVENOUS
  Filled 2017-06-16: qty 2

## 2017-06-16 MED ORDER — ROPIVACAINE HCL 2 MG/ML IJ SOLN
1.0000 mL | Freq: Once | INTRAMUSCULAR | Status: AC
  Administered 2017-06-16: 1 mL via EPIDURAL
  Filled 2017-06-16: qty 10

## 2017-06-16 MED ORDER — MIDAZOLAM HCL 5 MG/5ML IJ SOLN
1.0000 mg | INTRAMUSCULAR | Status: DC | PRN
  Administered 2017-06-16: 3 mg via INTRAVENOUS
  Filled 2017-06-16: qty 5

## 2017-06-16 MED ORDER — DEXAMETHASONE SODIUM PHOSPHATE 10 MG/ML IJ SOLN
10.0000 mg | Freq: Once | INTRAMUSCULAR | Status: AC
  Administered 2017-06-16: 10 mg
  Filled 2017-06-16: qty 1

## 2017-06-16 MED ORDER — SODIUM CHLORIDE 0.9% FLUSH
1.0000 mL | Freq: Once | INTRAVENOUS | Status: AC
  Administered 2017-06-16: 1 mL

## 2017-06-16 MED ORDER — LIDOCAINE HCL 2 % IJ SOLN
20.0000 mL | Freq: Once | INTRAMUSCULAR | Status: AC
  Administered 2017-06-16: 400 mg
  Filled 2017-06-16: qty 40

## 2017-06-16 MED ORDER — IOPAMIDOL (ISOVUE-M 200) INJECTION 41%
10.0000 mL | Freq: Once | INTRAMUSCULAR | Status: AC
  Administered 2017-06-16: 10 mL via EPIDURAL
  Filled 2017-06-16: qty 10

## 2017-06-16 NOTE — Progress Notes (Signed)
Patient's Name: Caleb [name]  MRN: [id number]  Referring Provider: Virginia Crews, MD  DOB: 05/29/1955  PCP: Virginia Crews, MD  DOS: 06/16/2017  Note by: Gaspar Cola, MD  Service setting: Ambulatory outpatient  Specialty: Interventional Pain Management  Patient type: Established  Location: [facility] (AMB) Pain Management Facility  Visit type: Interventional Procedure   Primary Reason for Visit: Interventional Pain Management Treatment. CC: Neck Pain (shoulder)  Procedure:       Anesthesia, Analgesia, Anxiolysis:  Type: Therapeutic, Inter-Laminar, Epidural Steroid Injection #2  Region: Posterior Cervico-thoracic Region Level: C7-T1 Laterality: Left-Sided Paramedial  Type: Moderate (Conscious) Sedation combined with Local Anesthesia Indication(s): Analgesia and Anxiety Route: Intravenous (IV) IV Access: Secured Sedation: Meaningful verbal contact was maintained at all times during the procedure  Local Anesthetic: Lidocaine 1-2%   Indications: 1. DDD (degenerative disc disease), cervical   2. Cervical disc disorder with radiculopathy of cervical region   3. 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 Cervical spondylosis with radiculopathy   5. Cervicalgia   6. Left arm weakness   7. Foraminal stenosis of cervical region    Pain Score: Pre-procedure: 3 /10 Post-procedure: 0-No pain/10  Pre-op Assessment:  Caleb [name] is a 62 y.o. (year old), male patient, seen today for interventional treatment. He  has a past surgical history that includes Cardiac catheterization (2015); Lumbar laminectomy (1989, 1999); Cervical fusion (1988, 1998); Liver surgery (2015); Partial colectomy (1990); Inguinal hernia repair (Bilateral, 1975); Lithotripsy; and Colonoscopy with propofol (N/A, 02/24/2017). Caleb [name] has a current medication list which includes the following prescription(s): adalimumab, albuterol, aspirin ec, cyclobenzaprine, fluticasone, gabapentin,  hydrochlorothiazide, isosorbide mononitrate, losartan, metoprolol tartrate, nitroglycerin, rosuvastatin, tiotropium, tramadol, triamcinolone ointment, and varenicline, and the following Facility-Administered Medications: fentanyl and midazolam. His primarily concern today is the Neck Pain (shoulder)  Initial Vital Signs:  Pulse/HCG Rate: 70ECG Heart Rate: 77 Temp: 97.7 F (36.5 C) Resp: 16 BP: 130/86 SpO2: 99 %  BMI: Estimated body mass index is 30.41 kg/m as calculated from the following:   Height as of 06/13/17: 5\' 8"  (1.727 m).   Weight as of 06/13/17: 200 lb (90.7 kg).  Risk Assessment: Allergies: Reviewed. He is allergic to lisinopril.  Allergy Precautions: None required Coagulopathies: Reviewed. None identified.  Blood-thinner therapy: None at this time Active Infection(s): Reviewed. None identified. Caleb [name] is afebrile  Site Confirmation: Caleb [name] was asked to confirm the procedure and laterality before marking the site Procedure checklist: Completed Consent: Before the procedure and under the influence of no sedative(s), amnesic(s), or anxiolytics, the patient was informed of the treatment options, risks and possible complications. To fulfill our ethical and legal obligations, as recommended by the American Medical Association's Code of Ethics, I have informed the patient of my clinical impression; the nature and purpose of the treatment or procedure; the risks, benefits, and possible complications of the intervention; the alternatives, including doing nothing; the risk(s) and benefit(s) of the alternative treatment(s) or procedure(s); and the risk(s) and benefit(s) of doing nothing. The patient was provided information about the general risks and possible complications associated with the procedure. These may include, but are not limited to: failure to achieve desired goals, infection, bleeding, organ or nerve damage, allergic reactions, paralysis, and death. In addition, the  patient was informed of those risks and complications associated to Spine-related procedures, such as failure to decrease pain; infection (i.e.: Meningitis, epidural or intraspinal abscess); bleeding (i.e.: epidural hematoma, subarachnoid hemorrhage, or any other type of intraspinal  or peri-dural bleeding); organ or nerve damage (i.e.: Any type of peripheral nerve, nerve root, or spinal cord injury) with subsequent damage to sensory, motor, and/or autonomic systems, resulting in permanent pain, numbness, and/or weakness of one or several areas of the body; allergic reactions; (i.e.: anaphylactic reaction); and/or death. Furthermore, the patient was informed of those risks and complications associated with the medications. These include, but are not limited to: allergic reactions (i.e.: anaphylactic or anaphylactoid reaction(s)); adrenal axis suppression; blood sugar elevation that in diabetics may result in ketoacidosis or comma; water retention that in patients with history of congestive heart failure may result in shortness of breath, pulmonary edema, and decompensation with resultant heart failure; weight gain; swelling or edema; medication-induced neural toxicity; particulate matter embolism and blood vessel occlusion with resultant organ, and/or nervous system infarction; and/or aseptic necrosis of one or more joints. Finally, the patient was informed that Medicine is not an exact science; therefore, there is also the possibility of unforeseen or unpredictable risks and/or possible complications that may result in a catastrophic outcome. The patient indicated having understood very clearly. We have given the patient no guarantees and we have made no promises. Enough time was given to the patient to ask questions, all of which were answered to the patient's satisfaction. Caleb [name] has indicated that he wanted to continue with the procedure. Attestation: I, the ordering provider, attest that I have discussed  with the patient the benefits, risks, side-effects, alternatives, likelihood of achieving goals, and potential problems during recovery for the procedure that I have provided informed consent. Date  Time: 06/16/2017  1:18 PM  Pre-Procedure Preparation:  Monitoring: As per clinic protocol. Respiration, ETCO2, SpO2, BP, heart rate and rhythm monitor placed and checked for adequate function Safety Precautions: Patient was assessed for positional comfort and pressure points before starting the procedure. Time-out: I initiated and conducted the "Time-out" before starting the procedure, as per protocol. The patient was asked to participate by confirming the accuracy of the "Time Out" information. Verification of the correct person, site, and procedure were performed and confirmed by me, the nursing staff, and the patient. "Time-out" conducted as per Joint Commission's Universal Protocol (UP.01.01.01). Time: 1417  Description of Procedure Process:   Position: Prone with head of the table was raised to facilitate breathing. Target Area: For Epidural Steroid injections the target is the interlaminar space, initially targeting the lower border of the superior vertebral body lamina. Approach: Paramedial approach. Area Prepped: Entire PosteriorCervical Region Prepping solution: ChloraPrep (2% chlorhexidine gluconate and 70% isopropyl alcohol) Safety Precautions: Aspiration looking for blood return was conducted prior to all injections. At no point did we inject any substances, as a needle was being advanced. No attempts were made at seeking any paresthesias. Safe injection practices and needle disposal techniques used. Medications properly checked for expiration dates. SDV (single dose vial) medications used. Description of the Procedure: Protocol guidelines were followed. The procedure needle was introduced through the skin, ipsilateral to the reported pain, and advanced to the target area. Bone was contacted  and the needle walked caudad, until the lamina was cleared. The epidural space was identified using "loss-of-resistance technique" with 2-3 ml of PF-NaCl (0.9% NSS), in a 5cc LOR glass syringe. Vitals:   06/16/17 1425 06/16/17 1435 06/16/17 1444 06/16/17 1455  BP: 118/77 111/84 (!) 122/91 119/86  Pulse: 71     Resp: 15 16 19  (!) 22  Temp:  98.2 F (36.8 C)  97.7 F (36.5 C)  SpO2: 97% 97% 100%  100%    Start Time: 1417 hrs. End Time: 1423 hrs. Materials:  Needle(s) Type: Epidural needle Gauge: 17G Length: 3.5-in Medication(s): Please see orders for medications and dosing details.  Imaging Guidance (Spinal):  Type of Imaging Technique: Fluoroscopy Guidance (Spinal) Indication(s): Assistance in needle guidance and placement for procedures requiring needle placement in or near specific anatomical locations not easily accessible without such assistance. Exposure Time: Please see nurses notes. Contrast: Before injecting any contrast, we confirmed that the patient did not have an allergy to iodine, shellfish, or radiological contrast. Once satisfactory needle placement was completed at the desired level, radiological contrast was injected. Contrast injected under live fluoroscopy. No contrast complications. See chart for type and volume of contrast used. Fluoroscopic Guidance: I was personally present during the use of fluoroscopy. "Tunnel Vision Technique" used to obtain the best possible view of the target area. Parallax error corrected before commencing the procedure. "Direction-depth-direction" technique used to introduce the needle under continuous pulsed fluoroscopy. Once target was reached, antero-posterior, oblique, and lateral fluoroscopic projection used confirm needle placement in all planes. Images permanently stored in EMR. Interpretation: I personally interpreted the imaging intraoperatively. Adequate needle placement confirmed in multiple planes. Appropriate spread of contrast into  desired area was observed. No evidence of afferent or efferent intravascular uptake. No intrathecal or subarachnoid spread observed. Permanent images saved into the patient's record.  Antibiotic Prophylaxis:   Anti-infectives (From admission, onward)   None     Indication(s): None identified  Post-operative Assessment:  Post-procedure Vital Signs:  Pulse/HCG Rate: 7176 Temp: 97.7 F (36.5 C) Resp: (!) 22 BP: 119/86 SpO2: 100 %  EBL: None  Complications: No immediate post-treatment complications observed by team, or reported by patient.  Note: The patient tolerated the entire procedure well. A repeat set of vitals were taken after the procedure and the patient was kept under observation following institutional policy, for this type of procedure. Post-procedural neurological assessment was performed, showing return to baseline, prior to discharge. The patient was provided with post-procedure discharge instructions, including a section on how to identify potential problems. Should any problems arise concerning this procedure, the patient was given instructions to immediately contact us, at any time, without hesitation. In any case, we plan to contact the patient by telephone for a follow-up status report regarding this interventional procedure.  Comments:  No additional relevant information.  Plan of Care    Imaging Orders     DG C-Arm 1-60 Min-No Report  Procedure Orders     Cervical Epidural Injection     Cervical Epidural Injection  Medications ordered for procedure: Meds ordered this encounter  Medications  . iopamidol (ISOVUE-M) 41 % intrathecal injection 10 mL    Must be Myelogram-compatible. If not available, you may substitute with a water-soluble, non-ionic, hypoallergenic, myelogram-compatible radiological contrast medium.  Marland Kitchen lidocaine (XYLOCAINE) 2 % (with pres) injection 400 mg  . midazolam (VERSED) 5 MG/5ML injection 1-2 mg    Make sure Flumazenil is available in  the pyxis when using this medication. If oversedation occurs, administer 0.2 mg IV over 15 sec. If after 45 sec no response, administer 0.2 mg again over 1 min; may repeat at 1 min intervals; not to exceed 4 doses (1 mg)  . fentaNYL (SUBLIMAZE) injection 25-50 mcg    Make sure Narcan is available in the pyxis when using this medication. In the event of respiratory depression (RR< 8/min): Titrate NARCAN (naloxone) in increments of 0.1 to 0.2 mg IV at 2-3 minute intervals, until  desired degree of reversal.  . lactated ringers infusion 1,000 mL  . sodium chloride flush (NS) 0.9 % injection 1 mL  . ropivacaine (PF) 2 mg/mL (0.2%) (NAROPIN) injection 1 mL  . dexamethasone (DECADRON) injection 10 mg   Medications administered: We administered iopamidol, lidocaine, midazolam, fentaNYL, lactated ringers, sodium chloride flush, ropivacaine (PF) 2 mg/mL (0.2%), and dexamethasone.  See the medical record for exact dosing, route, and time of administration.  New Prescriptions   No medications on file   Disposition: Discharge home  Discharge Date & Time: 06/16/2017; 1457 hrs.   Physician-requested Follow-up: Return for PPE (2 wks) + Procedure (w/ sedation): (L) CESI #3.  Future Appointments  Date Time Provider [place]  06/21/2017  2:15 PM Milinda Pointer, MD [facility] None  06/30/2017  9:45 AM Milinda Pointer, MD [facility] None  09/02/2017  8:20 AM Brita Romp, Dionne Bucy, MD [facility] None   Primary Care Physician: Virginia Crews, MD Location: [facility] Note by: Gaspar Cola, MD Date: 06/16/2017; Time: 4:39 PM  Disclaimer:  Medicine is not an Chief Strategy Officer. The only guarantee in medicine is that nothing is guaranteed. It is important to note that the decision to proceed with this intervention was based on the information collected from the patient. The Data and conclusions were drawn from the patient's questionnaire, the interview, and the  physical examination. Because the information was provided in large part by the patient, it cannot be guaranteed that it has not been purposely or unconsciously manipulated. Every effort has been made to obtain as much relevant data as possible for this evaluation. It is important to note that the conclusions that lead to this procedure are derived in large part from the available data. Always take into account that the treatment will also be dependent on availability of resources and existing treatment guidelines, considered by other Pain Management Practitioners as being common knowledge and practice, at the time of the intervention. For Medico-Legal purposes, it is also important to point out that variation in procedural techniques and pharmacological choices are the acceptable norm. The indications, contraindications, technique, and results of the above procedure should only be interpreted and judged by a Board-Certified Interventional Pain Specialist with extensive familiarity and expertise in the same exact procedure and technique.

## 2017-06-16 NOTE — Patient Instructions (Addendum)
____________________________________________________________________________________________  Post-Procedure Discharge Instructions  Instructions:  Apply ice: Fill a plastic sandwich bag with crushed ice. Cover it with a small towel and apply to injection site. Apply for 15 minutes then remove x 15 minutes. Repeat sequence on day of procedure, until you go to bed. The purpose is to minimize swelling and discomfort after procedure.  Apply heat: Apply heat to procedure site starting the day following the procedure. The purpose is to treat any soreness and discomfort from the procedure.  Food intake: Start with clear liquids (like water) and advance to regular food, as tolerated.   Physical activities: Keep activities to a minimum for the first 8 hours after the procedure.   Driving: If you have received any sedation, you are not allowed to drive for 24 hours after your procedure.  Blood thinner: Restart your blood thinner 6 hours after your procedure. (Only for those taking blood thinners)  Insulin: As soon as you can eat, you may resume your normal dosing schedule. (Only for those taking insulin)  Infection prevention: Keep procedure site clean and dry.  Post-procedure Pain Diary: Extremely important that this be done correctly and accurately. Recorded information will be used to determine the next step in treatment.  Pain evaluated is that of treated area only. Do not include pain from an untreated area.  Complete every hour, on the hour, for the initial 8 hours. Set an alarm to help you do this part accurately.  Do not go to sleep and have it completed later. It will not be accurate.  Follow-up appointment: Keep your follow-up appointment after the procedure. Usually 2 weeks for most procedures. (6 weeks in the case of radiofrequency.) Bring you pain diary.   Expect:  From numbing medicine (AKA: Local Anesthetics): Numbness or decrease in pain.  Onset: Full effect within 15  minutes of injected.  Duration: It will depend on the type of local anesthetic used. On the average, 1 to 8 hours.   From steroids: Decrease in swelling or inflammation. Once inflammation is improved, relief of the pain will follow.  Onset of benefits: Depends on the amount of swelling present. The more swelling, the longer it will take for the benefits to be seen. In some cases, up to 10 days.  Duration: Steroids will stay in the system x 2 weeks. Duration of benefits will depend on multiple posibilities including persistent irritating factors.  From procedure: Some discomfort is to be expected once the numbing medicine wears off. This should be minimal if ice and heat are applied as instructed.  Call if:  You experience numbness and weakness that gets worse with time, as opposed to wearing off.  New onset bowel or bladder incontinence. (This applies to Spinal procedures only)  Emergency Numbers:  Durning business hours (Monday - Thursday, 8:00 AM - 4:00 PM) (Friday, 9:00 AM - 12:00 Noon): [phone]  After hours: [phone] ____________________________________________________________________________________________   ____________________________________________________________________________________________  Preparing for Procedure with Sedation  Instructions: . Oral Intake: Do not eat or drink anything for at least 8 hours prior to your procedure. . Transportation: Public transportation is not allowed. Bring an adult driver. The driver must be physically present in our waiting room before any procedure can be started. Marland Kitchen Physical Assistance: Bring an adult physically capable of assisting you, in the event you need help. This adult should keep you company at home for at least 6 hours after the procedure. . Blood Pressure Medicine: Take your blood pressure medicine with a sip  of water the morning of the procedure. . Blood thinners:  . Diabetics on insulin: Notify the  staff so that you can be scheduled 1st case in the morning. If your diabetes requires high dose insulin, take only  of your normal insulin dose the morning of the procedure and notify the staff that you have done so. . Preventing infections: Shower with an antibacterial soap the morning of your procedure. . Build-up your immune system: Take 1000 mg of Vitamin C with every meal (3 times a day) the day prior to your procedure. . Antibiotics: Inform the staff if you have a condition or reason that requires you to take antibiotics before dental procedures. . Pregnancy: If you are pregnant, call and cancel the procedure. . Sickness: If you have a cold, fever, or any active infections, call and cancel the procedure. . Arrival: You must be in the facility at least 30 minutes prior to your scheduled procedure. . Children: Do not bring children with you. . Dress appropriately: Bring dark clothing that you would not mind if they get stained. . Valuables: Do not bring any jewelry or valuables.  Procedure appointments are reserved for interventional treatments only. . No Prescription Refills. . No medication changes will be discussed during procedure appointments. . No disability issues will be discussed.  Remember:  Regular Business hours are:  Monday to Thursday 8:00 AM to 4:00 PM  Provider's Schedule: Kalyani Maeda, MD:  Procedure days: Tuesday and Thursday 7:30 AM to 4:00 PM  Bilal Lateef, MD:  Procedure days: Monday and Wednesday 7:30 AM to 4:00 PM ____________________________________________________________________________________________    

## 2017-06-17 ENCOUNTER — Telehealth: Payer: Self-pay

## 2017-06-17 NOTE — Telephone Encounter (Signed)
Unable to talk with pt or leave a message. Instructed to call if needed.

## 2017-06-20 ENCOUNTER — Other Ambulatory Visit: Payer: Self-pay | Admitting: Family Medicine

## 2017-06-20 ENCOUNTER — Other Ambulatory Visit: Payer: Self-pay | Admitting: Pain Medicine

## 2017-06-20 DIAGNOSIS — M792 Neuralgia and neuritis, unspecified: Secondary | ICD-10-CM

## 2017-06-21 ENCOUNTER — Other Ambulatory Visit: Payer: Self-pay

## 2017-06-21 ENCOUNTER — Encounter: Payer: Self-pay | Admitting: Pain Medicine

## 2017-06-21 ENCOUNTER — Ambulatory Visit
Admission: RE | Admit: 2017-06-21 | Discharge: 2017-06-21 | Disposition: A | Discharge status: Home / Self Care | Payer: Medicaid Other | Source: Ambulatory Visit | Attending: Pain Medicine | Admitting: Pain Medicine

## 2017-06-21 ENCOUNTER — Ambulatory Visit: Payer: Medicaid Other | Admitting: Pain Medicine

## 2017-06-21 VITALS — BP 143/94 | HR 78 | Temp 97.9°F | Resp 18 | Ht 68.0 in | Wt 200.0 lb

## 2017-06-21 DIAGNOSIS — M792 Neuralgia and neuritis, unspecified: Secondary | ICD-10-CM

## 2017-06-21 DIAGNOSIS — G8929 Other chronic pain: Secondary | ICD-10-CM

## 2017-06-21 DIAGNOSIS — Z888 Allergy status to other drugs, medicaments and biological substances status: Secondary | ICD-10-CM | POA: Insufficient documentation

## 2017-06-21 DIAGNOSIS — G8918 Other acute postprocedural pain: Secondary | ICD-10-CM | POA: Insufficient documentation

## 2017-06-21 DIAGNOSIS — M25571 Pain in right ankle and joints of right foot: Secondary | ICD-10-CM | POA: Insufficient documentation

## 2017-06-21 DIAGNOSIS — G5731 Lesion of lateral popliteal nerve, right lower limb: Secondary | ICD-10-CM

## 2017-06-21 DIAGNOSIS — M79671 Pain in right foot: Secondary | ICD-10-CM | POA: Diagnosis not present

## 2017-06-21 MED ORDER — LIDOCAINE HCL 2 % IJ SOLN
20.0000 mL | Freq: Once | INTRAMUSCULAR | Status: AC
  Administered 2017-06-21: 400 mg

## 2017-06-21 MED ORDER — FENTANYL CITRATE (PF) 100 MCG/2ML IJ SOLN
25.0000 ug | INTRAMUSCULAR | Status: DC | PRN
  Administered 2017-06-21: 100 ug via INTRAVENOUS

## 2017-06-21 MED ORDER — LACTATED RINGERS IV SOLN
1000.0000 mL | Freq: Once | INTRAVENOUS | Status: AC
  Administered 2017-06-21: 1000 mL via INTRAVENOUS

## 2017-06-21 MED ORDER — ROPIVACAINE HCL 2 MG/ML IJ SOLN
INTRAMUSCULAR | Status: AC
  Filled 2017-06-21: qty 10

## 2017-06-21 MED ORDER — MIDAZOLAM HCL 5 MG/5ML IJ SOLN
1.0000 mg | INTRAMUSCULAR | Status: DC | PRN
  Administered 2017-06-21: 4 mg via INTRAVENOUS

## 2017-06-21 MED ORDER — OXYCODONE-ACETAMINOPHEN 5-325 MG PO TABS: 1.0000 | ORAL_TABLET | Freq: Four times a day (QID) | ORAL | 0 refills | Status: AC | PRN

## 2017-06-21 MED ORDER — TRIAMCINOLONE ACETONIDE 40 MG/ML IJ SUSP
40.0000 mg | Freq: Once | INTRAMUSCULAR | Status: AC
  Administered 2017-06-21: 40 mg

## 2017-06-21 MED ORDER — TRIAMCINOLONE ACETONIDE 40 MG/ML IJ SUSP
INTRAMUSCULAR | Status: AC
  Filled 2017-06-21: qty 1

## 2017-06-21 MED ORDER — MIDAZOLAM HCL 5 MG/5ML IJ SOLN
INTRAMUSCULAR | Status: AC
  Filled 2017-06-21: qty 5

## 2017-06-21 MED ORDER — ROPIVACAINE HCL 2 MG/ML IJ SOLN
9.0000 mL | Freq: Once | INTRAMUSCULAR | Status: AC
  Administered 2017-06-21: 10 mL via PERINEURAL

## 2017-06-21 MED ORDER — FENTANYL CITRATE (PF) 100 MCG/2ML IJ SOLN
INTRAMUSCULAR | Status: AC
  Filled 2017-06-21: qty 2

## 2017-06-21 NOTE — Patient Instructions (Addendum)
____________________________________________________________________________________________  Post-Procedure Discharge Instructions  Instructions:  Apply ice: Fill a plastic sandwich bag with crushed ice. Cover it with a small towel and apply to injection site. Apply for 15 minutes then remove x 15 minutes. Repeat sequence on day of procedure, until you go to bed. The purpose is to minimize swelling and discomfort after procedure.  Apply heat: Apply heat to procedure site starting the day following the procedure. The purpose is to treat any soreness and discomfort from the procedure.  Food intake: Start with clear liquids (like water) and advance to regular food, as tolerated.   Physical activities: Keep activities to a minimum for the first 8 hours after the procedure.   Driving: If you have received any sedation, you are not allowed to drive for 24 hours after your procedure.  Blood thinner: Restart your blood thinner 6 hours after your procedure. (Only for those taking blood thinners)  Insulin: As soon as you can eat, you may resume your normal dosing schedule. (Only for those taking insulin)  Infection prevention: Keep procedure site clean and dry.  Post-procedure Pain Diary: Extremely important that this be done correctly and accurately. Recorded information will be used to determine the next step in treatment.  Pain evaluated is that of treated area only. Do not include pain from an untreated area.  Complete every hour, on the hour, for the initial 8 hours. Set an alarm to help you do this part accurately.  Do not go to sleep and have it completed later. It will not be accurate.  Follow-up appointment: Keep your follow-up appointment after the procedure. Usually 2 weeks for most procedures. (6 weeks in the case of radiofrequency.) Bring you pain diary.   Expect:  From numbing medicine (AKA: Local Anesthetics): Numbness or decrease in pain.  Onset: Full effect within 15  minutes of injected.  Duration: It will depend on the type of local anesthetic used. On the average, 1 to 8 hours.   From steroids: Decrease in swelling or inflammation. Once inflammation is improved, relief of the pain will follow.  Onset of benefits: Depends on the amount of swelling present. The more swelling, the longer it will take for the benefits to be seen. In some cases, up to 10 days.  Duration: Steroids will stay in the system x 2 weeks. Duration of benefits will depend on multiple posibilities including persistent irritating factors.  From procedure: Some discomfort is to be expected once the numbing medicine wears off. This should be minimal if ice and heat are applied as instructed.  Call if:  You experience numbness and weakness that gets worse with time, as opposed to wearing off.  New onset bowel or bladder incontinence. (This applies to Spinal procedures only)  Emergency Numbers:  Durning business hours (Monday - Thursday, 8:00 AM - 4:00 PM) (Friday, 9:00 AM - 12:00 Noon): [phone]  After hours: [phone] ____________________________________________________________________________________________   Pain Management Discharge Instructions  General Discharge Instructions :  If you need to reach your doctor call: Monday-Friday 8:00 am - 4:00 pm at [phone] or toll free [phone].  After clinic hours [phone] to have operator reach doctor.  Bring all of your medication bottles to all your appointments in the pain clinic.  To cancel or reschedule your appointment with Pain Management please remember to call 24 hours in advance to avoid a fee.  Refer to the educational materials which you have been given on: General Risks, I had my Procedure. Discharge Instructions, Post Sedation.    Post Procedure Instructions:  The drugs you were given will stay in your system until tomorrow, so for the next 24 hours you should not drive, make any legal  decisions or drink any alcoholic beverages.  You may eat anything you prefer, but it is better to start with liquids then soups and crackers, and gradually work up to solid foods.  Please notify your doctor immediately if you have any unusual bleeding, trouble breathing or pain that is not related to your normal pain.  Depending on the type of procedure that was done, some parts of your body may feel week and/or numb.  This usually clears up by tonight or the next day.  Walk with the use of an assistive device or accompanied by an adult for the 24 hours.  You may use ice on the affected area for the first 24 hours.  Put ice in a Ziploc bag and cover with a towel and place against area 15 minutes on 15 minutes off.  You may switch to heat after 24 hours.Radiofrequency Lesioning Radiofrequency lesioning is a procedure that is performed to relieve pain. The procedure is often used for back, neck, or arm pain. Radiofrequency lesioning involves the use of a machine that creates radio waves to make heat. During the procedure, the heat is applied to the nerve that carries the pain signal. The heat damages the nerve and interferes with the pain signal. Pain relief usually starts about 2 weeks after the procedure and lasts for 6 months to 1 year. Tell a health care provider about:  Any allergies you have.  All medicines you are taking, including vitamins, herbs, eye drops, creams, and over-the-counter medicines.  Any problems you or family members have had with anesthetic medicines.  Any blood disorders you have.  Any surgeries you have had.  Any medical conditions you have.  Whether you are pregnant or may be pregnant. What are the risks? Generally, this is a safe procedure. However, problems may occur, including:  Pain or soreness at the injection site.  Infection at the injection site.  Damage to nerves or blood vessels.  What happens before the procedure?  Ask your health care  provider about: ? Changing or stopping your regular medicines. This is especially important if you are taking diabetes medicines or blood thinners. ? Taking medicines such as aspirin and ibuprofen. These medicines can thin your blood. Do not take these medicines before your procedure if your health care provider instructs you not to.  Follow instructions from your health care provider about eating or drinking restrictions.  Plan to have someone take you home after the procedure.  If you go home right after the procedure, plan to have someone with you for 24 hours. What happens during the procedure?  You will be given one or more of the following: ? A medicine to help you relax (sedative). ? A medicine to numb the area (local anesthetic).  You will be awake during the procedure. You will need to be able to talk with the health care provider during the procedure.  With the help of a type of X-ray (fluoroscopy), the health care provider will insert a radiofrequency needle into the area to be treated.  Next, a wire that carries the radio waves (electrode) will be put through the radiofrequency needle. An electrical pulse will be sent through the electrode to verify the correct nerve. You will feel a tingling sensation, and you may have muscle twitching.  Then, the tissue that  is around the needle tip will be heated by an electric current that is passed using the radiofrequency machine. This will numb the nerves.  A bandage (dressing) will be put on the insertion area after the procedure is done. The procedure may vary among health care providers and hospitals. What happens after the procedure?  Your blood pressure, heart rate, breathing rate, and blood oxygen level will be monitored often until the medicines you were given have worn off.  Return to your normal activities as directed by your health care provider. This information is not intended to replace advice given to you by your health  care provider. Make sure you discuss any questions you have with your health care provider. Document Released: 08/19/2010 Document Revised: 05/29/2015 Document Reviewed: 01/28/2014 [employer] Interactive Patient Education  [employer].

## 2017-06-21 NOTE — Progress Notes (Signed)
Safety precautions to be maintained throughout the outpatient stay will include: orient to surroundings, keep bed in low position, maintain call bell within reach at all times, provide assistance with transfer out of bed and ambulation.  

## 2017-06-21 NOTE — Progress Notes (Signed)
Patient's Name: Caleb [name]  MRN: [id number]  Referring Provider: Virginia Crews, MD  DOB: 07-Jun-1955  PCP: Virginia Crews, MD  DOS: 06/21/2017  Note by: Gaspar Cola, MD  Service setting: Ambulatory outpatient  Specialty: Interventional Pain Management  Patient type: Established  Location: [facility] (AMB) Pain Management Facility  Visit type: Interventional Procedure   Primary Reason for Visit: Interventional Pain Management Treatment. CC: Foot Pain (right)  Procedure:       Anesthesia, Analgesia, Anxiolysis:  Type: Therapeutic Common peroneal Nerve Radiofrequency Ablation/Neurotomy #1  Region: proximal posterolateral aspect of fibula Level: Distal lower extremity. Laterality: Right-side  Type: Moderate (Conscious) Sedation combined with Local Anesthesia Indication(s): Analgesia and Anxiety Route: Intravenous (IV) IV Access: Secured Sedation: Meaningful verbal contact was maintained at all times during the procedure  Local Anesthetic: Lidocaine 1-2%   Indications: 1. Chronic ankle pain (Secondary Area of Pain) (Right)   2. Chronic foot pain (Primary Area of Pain) (Right)   3. Disorder of superficial peroneal nerve (Right)   4. Neurogenic foot pain (Right)   5. Chronic pain of right ankle   6. Chronic foot pain, right   7. Disorder of right superficial peroneal nerve   8. Neurogenic pain of right foot   9. Acute postoperative pain    Mr. Sattar has been dealing with the above chronic pain for longer than three months and has either failed to respond, was unable to tolerate, or simply did not get enough benefit from other more conservative therapies including, but not limited to: 1. Over-the-counter medications 2. Anti-inflammatory medications 3. Muscle relaxants 4. Membrane stabilizers 5. Opioids 6. Physical therapy 7. Modalities (Heat, ice, etc.) 8. Invasive techniques such as nerve blocks. Mr. Hyland has attained more than 50% relief of the pain from a  series of diagnostic injections conducted in separate occasions.  Pain Score: Pre-procedure: 3 /10 Post-procedure: 0-No pain/10  Pre-op Assessment:  Mr. Fancher is a 62 y.o. (year old), male patient, seen today for interventional treatment. He  has a past surgical history that includes Cardiac catheterization (2015); Lumbar laminectomy (1989, 1999); Cervical fusion (1988, 1998); Liver surgery (2015); Partial colectomy (1990); Inguinal hernia repair (Bilateral, 1975); Lithotripsy; and Colonoscopy with propofol (N/A, 02/24/2017). Mr. Bouillon has a current medication list which includes the following prescription(s): adalimumab, albuterol, aspirin ec, chantix continuing month pak, cyclobenzaprine, fluticasone, gabapentin, hydrochlorothiazide, isosorbide mononitrate, losartan, metoprolol tartrate, nitroglycerin, rosuvastatin, tiotropium, tramadol, triamcinolone ointment, and oxycodone-acetaminophen, and the following Facility-Administered Medications: fentanyl and midazolam. His primarily concern today is the Foot Pain (right)  Initial Vital Signs:  Pulse/HCG Rate: 78ECG Heart Rate: 73 Temp: 97.8 F (36.6 C) Resp: 18 BP: 131/87 SpO2: 99 %  BMI: Estimated body mass index is 30.41 kg/m as calculated from the following:   Height as of this encounter: 5\' 8"  (1.727 m).   Weight as of this encounter: 200 lb (90.7 kg).  Risk Assessment: Allergies: Reviewed. He is allergic to lisinopril.  Allergy Precautions: None required Coagulopathies: Reviewed. None identified.  Blood-thinner therapy: None at this time Active Infection(s): Reviewed. None identified. Mr. Brabson is afebrile  Site Confirmation: Mr. Shells was asked to confirm the procedure and laterality before marking the site Procedure checklist: Completed Consent: Before the procedure and under the influence of no sedative(s), amnesic(s), or anxiolytics, the patient was informed of the treatment options, risks and possible complications. To  fulfill our ethical and legal obligations, as recommended by the American Medical Association's Code of Ethics, I have informed the patient of  my clinical impression; the nature and purpose of the treatment or procedure; the risks, benefits, and possible complications of the intervention; the alternatives, including doing nothing; the risk(s) and benefit(s) of the alternative treatment(s) or procedure(s); and the risk(s) and benefit(s) of doing nothing. The patient was provided information about the general risks and possible complications associated with the procedure. These may include, but are not limited to: failure to achieve desired goals, infection, bleeding, organ or nerve damage, allergic reactions, paralysis, and death. In addition, the patient was informed of those risks and complications associated to the procedure, such as failure to decrease pain; infection; bleeding; organ or nerve damage with subsequent damage to sensory, motor, and/or autonomic systems, resulting in permanent pain, numbness, and/or weakness of one or several areas of the body; allergic reactions; (i.e.: anaphylactic reaction); and/or death. Furthermore, the patient was informed of those risks and complications associated with the medications. These include, but are not limited to: allergic reactions (i.e.: anaphylactic or anaphylactoid reaction(s)); adrenal axis suppression; blood sugar elevation that in diabetics may result in ketoacidosis or comma; water retention that in patients with history of congestive heart failure may result in shortness of breath, pulmonary edema, and decompensation with resultant heart failure; weight gain; swelling or edema; medication-induced neural toxicity; particulate matter embolism and blood vessel occlusion with resultant organ, and/or nervous system infarction; and/or aseptic necrosis of one or more joints. Finally, the patient was informed that Medicine is not an exact science; therefore,  there is also the possibility of unforeseen or unpredictable risks and/or possible complications that may result in a catastrophic outcome. The patient indicated having understood very clearly. We have given the patient no guarantees and we have made no promises. Enough time was given to the patient to ask questions, all of which were answered to the patient's satisfaction. Mr. Membreno has indicated that he wanted to continue with the procedure. Attestation: I, the ordering provider, attest that I have discussed with the patient the benefits, risks, side-effects, alternatives, likelihood of achieving goals, and potential problems during recovery for the procedure that I have provided informed consent. Date  Time: 06/21/2017  2:19 PM  Pre-Procedure Preparation:  Monitoring: As per clinic protocol. Respiration, ETCO2, SpO2, BP, heart rate and rhythm monitor placed and checked for adequate function Safety Precautions: Patient was assessed for positional comfort and pressure points before starting the procedure. Time-out: I initiated and conducted the "Time-out" before starting the procedure, as per protocol. The patient was asked to participate by confirming the accuracy of the "Time Out" information. Verification of the correct person, site, and procedure were performed and confirmed by me, the nursing staff, and the patient. "Time-out" conducted as per Joint Commission's Universal Protocol (UP.01.01.01). Time: 1507  Description of Procedure:       Position: Left lateral decubitus Laterality: Right-side Target Area: the posterolateral side of the knee around the biceps femoris tendon and the fibular head Approach: Percutaneous approach. Area Prepped: Entire mid - posterolateral lower extremity region Prepping solution: ChloraPrep (2% chlorhexidine gluconate and 70% isopropyl alcohol) Safety Precautions: Aspiration looking for blood return was conducted prior to all injections. At no point did we inject  any substances, as a needle was being advanced. Before injecting, the patient was told to immediately notify me if he was experiencing any new onset of "ringing in the ears, or metallic taste in the mouth". No attempts were made at seeking any paresthesias. Safe injection practices and needle disposal techniques used. Medications properly checked for expiration dates.  SDV (single dose vial) medications used. After the completion of the procedure, all disposable equipment used was discarded in the proper designated medical waste containers. Local Anesthesia: Protocol guidelines were followed. The patient was positioned over the fluoroscopy table. The area was prepped in the usual manner. The time-out was completed. The target area was identified using fluoroscopy. A 12-in long, straight, sterile hemostat was used with fluoroscopic guidance to locate the targets for each level blocked. Once located, the skin was marked with an approved surgical skin marker. Once all sites were marked, the skin (epidermis, dermis, and hypodermis), as well as deeper tissues (fat, connective tissue and muscle) were infiltrated with a small amount of a short-acting local anesthetic, loaded on a 10cc syringe with a 25G, 1.5-in  Needle. An appropriate amount of time was allowed for local anesthetics to take effect before proceeding to the next step. Local Anesthetic: Lidocaine 2.0% The unused portion of the local anesthetic was discarded in the proper designated containers. Technical explanation of process:  Radiofrequency Ablation (RFA) Common peroneal Nerve RFA: The space between the fibular head and the lateral belly of the gastrocnemius muscle was   identified. Under fluoroscopic guidance, a Radiofrequency needle was inserted until adequate position was attained. Sensory and motor testing was conducted to properly adjust the optimal position of the needle. This was archived by eliciting sensory stimulation below below 0.7 and above  0.3 volts. This was followed by motor testing ensuring that there was no significant motor stimulation below 2.5 times the identified sensory threshold. Once satisfactory placement of the needle was achieved, the numbing solution was slowly injected after negative aspiration for blood. 2.0 mL of the local anesthetic solution was injected without difficulty or complication. After waiting between 3 to 4 minutes, the ablation was performed. Once completed, the needle was then removed and the area cleansed, making sure to leave some of the prepping solution back to take advantage of its long term bactericidal properties. The needle was inspected and found to be intact.  Radiofrequency lesioning (ablation):  Radiofrequency Generator: NeuroTherm [id number] Sensory Stimulation Parameters: 50 Hz was used to locate & identify the nerve, making sure that the needle was positioned such that there was no sensory stimulation below 0.3 V or above 0.7 V. Patient felt stimulation over  Motor Stimulation Parameters: 2 Hz was used to evaluate the motor component. Care was taken not to lesion any nerves that demonstrated motor stimulation of the lower extremities at an output of less than 2.5 times that of the sensory threshold, or a maximum of 2.0 V. Lesioning Technique Parameters: Standard Radiofrequency settings. (Not bipolar or pulsed.) Temperature Settings: 80 degrees C Lesioning time: 60 seconds  Intra-operative Compliance: Compliant  Materials & Medications: Needle(s) (Electrode/Cannula) Type: Teflon-coated, curved tip, Radiofrequency needle(s) Gauge: 22G Length: 10cm Numbing solution: 0.2% PF-Ropivacaine + Triamcinolone (40 mg/mL) diluted to a final concentration of 4 mg of Triamcinolone/mL of Ropivacaine The unused portion of the solution was discarded in the proper designated containers.  Once the entire procedure was completed, the treated area was cleaned, making sure to leave some of the prepping solution  back to take advantage of its long term bactericidal properties.     Vitals:   06/21/17 1537 06/21/17 1547 06/21/17 1557 06/21/17 1605  BP: 116/84 97/71 140/89 (!) 143/94  Pulse:      Resp: 16 19 19 18   Temp:    97.9 F (36.6 C)  TempSrc:    Temporal  SpO2: 95% 98% 100% 100%  Weight:      Height:        Start Time: 1507 hrs. End Time: 1536 hrs.  Imaging Guidance (Non-Spinal):  Type of Imaging Technique: Fluoroscopy Guidance (Non-Spinal) Indication(s): Assistance in needle guidance and placement for procedures requiring needle placement in or near specific anatomical locations not easily accessible without such assistance. Exposure Time: Please see nurses notes. Contrast: None used. Fluoroscopic Guidance: I was personally present during the use of fluoroscopy. "Tunnel Vision Technique" used to obtain the best possible view of the target area. Parallax error corrected before commencing the procedure. "Direction-depth-direction" technique used to introduce the needle under continuous pulsed fluoroscopy. Once target was reached, antero-posterior, oblique, and lateral fluoroscopic projection used confirm needle placement in all planes. Images permanently stored in EMR. Interpretation: No contrast injected. I personally interpreted the imaging intraoperatively. Adequate needle placement confirmed in multiple planes. Permanent images saved into the patient's record.  Antibiotic Prophylaxis:   Anti-infectives (From admission, onward)   None     Indication(s): None identified  Post-operative Assessment:  Post-procedure Vital Signs:  Pulse/HCG Rate: 7872 Temp: 97.9 F (36.6 C) Resp: 18 BP: (!) 143/94 SpO2: 100 %  EBL: None  Complications: No immediate post-treatment complications observed by team, or reported by patient.  Note: The patient tolerated the entire procedure well. A repeat set of vitals were taken after the procedure and the patient was kept under observation  following institutional policy, for this type of procedure. Post-procedural neurological assessment was performed, showing return to baseline, prior to discharge. The patient was provided with post-procedure discharge instructions, including a section on how to identify potential problems. Should any problems arise concerning this procedure, the patient was given instructions to immediately contact us, at any time, without hesitation. In any case, we plan to contact the patient by telephone for a follow-up status report regarding this interventional procedure.  Comments:  No additional relevant information.  Plan of Care    Imaging Orders     DG C-Arm 1-60 Min-No Report  Procedure Orders     Radiofrequency ablation, other  Medications ordered for procedure: Meds ordered this encounter  Medications  . lidocaine (XYLOCAINE) 2 % (with pres) injection 400 mg  . midazolam (VERSED) 5 MG/5ML injection 1-2 mg    Make sure Flumazenil is available in the pyxis when using this medication. If oversedation occurs, administer 0.2 mg IV over 15 sec. If after 45 sec no response, administer 0.2 mg again over 1 min; may repeat at 1 min intervals; not to exceed 4 doses (1 mg)  . fentaNYL (SUBLIMAZE) injection 25-50 mcg    Make sure Narcan is available in the pyxis when using this medication. In the event of respiratory depression (RR< 8/min): Titrate NARCAN (naloxone) in increments of 0.1 to 0.2 mg IV at 2-3 minute intervals, until desired degree of reversal.  . lactated ringers infusion 1,000 mL  . ropivacaine (PF) 2 mg/mL (0.2%) (NAROPIN) injection 9 mL  . triamcinolone acetonide (KENALOG-40) injection 40 mg  . oxyCODONE-acetaminophen (PERCOCET) 5-325 MG tablet    Sig: Take 1 tablet by mouth every 6 (six) hours as needed for up to 7 days for severe pain.    Dispense:  28 tablet    Refill:  0    For acute post-operative pain. Not to be refilled. To last 7 days.   Medications administered: We  administered lidocaine, midazolam, fentaNYL, lactated ringers, ropivacaine (PF) 2 mg/mL (0.2%), and triamcinolone acetonide.  See the medical record for exact dosing, route,  and time of administration.  New Prescriptions   OXYCODONE-ACETAMINOPHEN (PERCOCET) 5-325 MG TABLET    Take 1 tablet by mouth every 6 (six) hours as needed for up to 7 days for severe pain.   Disposition: Discharge home  Discharge Date & Time: 06/21/2017; 1606 hrs.   Physician-requested Follow-up: Return for post-procedure eval (2 wks), w/ Dionisio David, NP.  Future Appointments  Date Time Provider [place]  06/30/2017  9:45 AM Milinda Pointer, MD [facility] None  07/06/2017 10:45 AM Vevelyn Francois, NP [facility] None  09/02/2017  8:20 AM Bacigalupo, Dionne Bucy, MD [facility] None   Primary Care Physician: Virginia Crews, MD Location: [facility] Note by: Gaspar Cola, MD Date: 06/21/2017; Time: 4:18 PM  Disclaimer:  Medicine is not an Chief Strategy Officer. The only guarantee in medicine is that nothing is guaranteed. It is important to note that the decision to proceed with this intervention was based on the information collected from the patient. The Data and conclusions were drawn from the patient's questionnaire, the interview, and the physical examination. Because the information was provided in large part by the patient, it cannot be guaranteed that it has not been purposely or unconsciously manipulated. Every effort has been made to obtain as much relevant data as possible for this evaluation. It is important to note that the conclusions that lead to this procedure are derived in large part from the available data. Always take into account that the treatment will also be dependent on availability of resources and existing treatment guidelines, considered by other Pain Management Practitioners as being common knowledge and practice, at the time of the intervention. For Medico-Legal  purposes, it is also important to point out that variation in procedural techniques and pharmacological choices are the acceptable norm. The indications, contraindications, technique, and results of the above procedure should only be interpreted and judged by a Board-Certified Interventional Pain Specialist with extensive familiarity and expertise in the same exact procedure and technique.

## 2017-06-22 NOTE — Telephone Encounter (Signed)
Spoke with patient verbalizes no questions or concerns re; procedure on yesterday.  

## 2017-06-28 ENCOUNTER — Ambulatory Visit: Payer: Medicaid Other | Admitting: Pain Medicine

## 2017-06-30 ENCOUNTER — Encounter: Payer: Self-pay | Admitting: Pain Medicine

## 2017-06-30 ENCOUNTER — Ambulatory Visit: Admission: RE | Admit: 2017-06-30 | Payer: Medicaid Other | Source: Ambulatory Visit

## 2017-06-30 ENCOUNTER — Ambulatory Visit (HOSPITAL_BASED_OUTPATIENT_CLINIC_OR_DEPARTMENT_OTHER): Payer: Medicaid Other | Admitting: Pain Medicine

## 2017-06-30 ENCOUNTER — Ambulatory Visit: Payer: Medicaid Other | Attending: Pain Medicine | Admitting: Pain Medicine

## 2017-06-30 ENCOUNTER — Other Ambulatory Visit: Payer: Self-pay

## 2017-06-30 VITALS — BP 116/83 | HR 66 | Temp 97.7°F | Resp 18 | Ht 68.0 in | Wt 200.0 lb

## 2017-06-30 DIAGNOSIS — I252 Old myocardial infarction: Secondary | ICD-10-CM | POA: Diagnosis not present

## 2017-06-30 DIAGNOSIS — Z79899 Other long term (current) drug therapy: Secondary | ICD-10-CM | POA: Insufficient documentation

## 2017-06-30 DIAGNOSIS — I509 Heart failure, unspecified: Secondary | ICD-10-CM | POA: Diagnosis not present

## 2017-06-30 DIAGNOSIS — F17210 Nicotine dependence, cigarettes, uncomplicated: Secondary | ICD-10-CM | POA: Insufficient documentation

## 2017-06-30 DIAGNOSIS — I110 Hypertensive heart disease with heart failure: Secondary | ICD-10-CM | POA: Insufficient documentation

## 2017-06-30 DIAGNOSIS — E785 Hyperlipidemia, unspecified: Secondary | ICD-10-CM | POA: Insufficient documentation

## 2017-06-30 DIAGNOSIS — M7918 Myalgia, other site: Secondary | ICD-10-CM

## 2017-06-30 DIAGNOSIS — M25571 Pain in right ankle and joints of right foot: Secondary | ICD-10-CM | POA: Diagnosis not present

## 2017-06-30 DIAGNOSIS — I2510 Atherosclerotic heart disease of native coronary artery without angina pectoris: Secondary | ICD-10-CM | POA: Diagnosis not present

## 2017-06-30 DIAGNOSIS — M5030 Other cervical disc degeneration, unspecified cervical region: Secondary | ICD-10-CM

## 2017-06-30 DIAGNOSIS — M79671 Pain in right foot: Secondary | ICD-10-CM

## 2017-06-30 DIAGNOSIS — Z7982 Long term (current) use of aspirin: Secondary | ICD-10-CM | POA: Insufficient documentation

## 2017-06-30 DIAGNOSIS — M792 Neuralgia and neuritis, unspecified: Secondary | ICD-10-CM

## 2017-06-30 DIAGNOSIS — M5010 Cervical disc disorder with radiculopathy, unspecified cervical region: Secondary | ICD-10-CM

## 2017-06-30 DIAGNOSIS — Z5181 Encounter for therapeutic drug level monitoring: Secondary | ICD-10-CM | POA: Insufficient documentation

## 2017-06-30 DIAGNOSIS — G4733 Obstructive sleep apnea (adult) (pediatric): Secondary | ICD-10-CM | POA: Insufficient documentation

## 2017-06-30 DIAGNOSIS — J449 Chronic obstructive pulmonary disease, unspecified: Secondary | ICD-10-CM | POA: Diagnosis not present

## 2017-06-30 DIAGNOSIS — G894 Chronic pain syndrome: Secondary | ICD-10-CM | POA: Diagnosis not present

## 2017-06-30 DIAGNOSIS — G5731 Lesion of lateral popliteal nerve, right lower limb: Secondary | ICD-10-CM

## 2017-06-30 DIAGNOSIS — G8929 Other chronic pain: Secondary | ICD-10-CM

## 2017-06-30 MED ORDER — IOPAMIDOL (ISOVUE-M 200) INJECTION 41%: 10.0000 mL | Freq: Once | INTRAMUSCULAR | Status: DC

## 2017-06-30 MED ORDER — CYCLOBENZAPRINE HCL 10 MG PO TABS: 10.0000 mg | ORAL_TABLET | Freq: Every day | ORAL | 2 refills | Status: DC

## 2017-06-30 MED ORDER — ROPIVACAINE HCL 2 MG/ML IJ SOLN: 1.0000 mL | Freq: Once | INTRAMUSCULAR | Status: DC

## 2017-06-30 MED ORDER — FENTANYL CITRATE (PF) 100 MCG/2ML IJ SOLN: 25.0000 ug | INTRAMUSCULAR | Status: DC | PRN

## 2017-06-30 MED ORDER — MIDAZOLAM HCL 5 MG/5ML IJ SOLN: 1.0000 mg | INTRAMUSCULAR | Status: DC | PRN

## 2017-06-30 MED ORDER — TRAMADOL HCL 50 MG PO TABS: 50.0000 mg | ORAL_TABLET | Freq: Two times a day (BID) | ORAL | 2 refills | Status: DC

## 2017-06-30 MED ORDER — SODIUM CHLORIDE 0.9% FLUSH: 1.0000 mL | Freq: Once | INTRAVENOUS | Status: DC

## 2017-06-30 MED ORDER — GABAPENTIN 300 MG PO CAPS: 900.0000 mg | ORAL_CAPSULE | Freq: Four times a day (QID) | ORAL | 2 refills | Status: DC

## 2017-06-30 MED ORDER — LIDOCAINE HCL 2 % IJ SOLN: 20.0000 mL | Freq: Once | INTRAMUSCULAR | Status: DC

## 2017-06-30 MED ORDER — DEXAMETHASONE SODIUM PHOSPHATE 10 MG/ML IJ SOLN: 10.0000 mg | Freq: Once | INTRAMUSCULAR | Status: DC

## 2017-06-30 MED ORDER — LACTATED RINGERS IV SOLN: 1000.0000 mL | Freq: Once | INTRAVENOUS | Status: DC

## 2017-06-30 NOTE — Progress Notes (Signed)
Nursing Pain Medication Assessment:  Safety precautions to be maintained throughout the outpatient stay will include: orient to surroundings, keep bed in low position, maintain call bell within reach at all times, provide assistance with transfer out of bed and ambulation.  Medication Inspection Compliance: Pill count conducted under aseptic conditions, in front of the patient. Neither the pills nor the bottle was removed from the patient's sight at any time. Once count was completed pills were immediately returned to the patient in their original bottle.  Medication: Tramadol (Ultram) Pill/Patch Count: 33 of 46 pills remain Pill/Patch Appearance: Markings consistent with prescribed medication Bottle Appearance: Standard pharmacy container. Clearly labeled. Filled Date: 06 / 21 / 2019 Last Medication intake:  Today

## 2017-06-30 NOTE — Progress Notes (Signed)
Appointment Cancelled

## 2017-06-30 NOTE — Progress Notes (Signed)
Patient's Name: Caleb [name]  MRN: [id number]  Referring Provider: Virginia Crews, MD  DOB: 1955/11/03  PCP: Virginia Crews, MD  DOS: 06/30/2017  Note by: Gaspar Cola, MD  Service setting: Ambulatory outpatient  Specialty: Interventional Pain Management  Location: [facility] (AMB) Pain Management Facility    Patient type: Established   Primary Reason(s) for Visit: Encounter for prescription drug management & post-procedure evaluation of chronic illness with mild to moderate exacerbation(Level of risk: moderate) CC: Foot Pain (right) and Neck Pain  HPI  Caleb [name] is a 62 y.o. year old, male patient, who comes today for a post-procedure evaluation and medication management. He has OSA (obstructive sleep apnea); Myocardial infarction ([place]); Hypertension; Hyperlipidemia; COPD (chronic obstructive pulmonary disease) ([place]); Chronic foot pain (Primary Area of Pain) (Right); Tobacco abuse; Erectile dysfunction; CAD (coronary artery disease), native coronary artery; Chronic ankle pain (Secondary Area of Pain) (Right); Chronic thoracic back pain [facility] Area of Pain) (Midline); Chronic pain syndrome; Long term current use of opiate analgesic; Pharmacologic therapy; Disorder of skeletal system; Problems influencing health status; Elevated C-reactive protein (CRP); Elevated sed rate; 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); Spondylosis without myelopathy or radiculopathy, cervicothoracic region; Chronic musculoskeletal pain; Neurogenic foot pain (Right); DDD (degenerative disc disease), cervical; 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 His primarily concern today is the Foot Pain (right) and Neck Pain  Pain Assessment: Location: Right Foot Radiating: denies Onset: More than a month ago Duration: Chronic pain Quality: Aching, Burning, Constant, Discomfort Severity: 3 /10 (subjective, self-reported pain score)  Note: Reported level is compatible with observation.                         When using our objective Pain Scale, levels between 6 and 10/10 are said to belong in an emergency room, as it progressively worsens from a 6/10, described as severely limiting, requiring emergency care not usually available at an outpatient pain management facility. At a 6/10 level, communication becomes difficult and requires great effort. Assistance to reach the emergency department may be required. Facial flushing and profuse sweating along with potentially dangerous increases in heart rate and blood pressure will be evident. Effect on ADL: prolonged walking Timing: Constant Modifying factors: procedures BP: 116/83  HR: 66  [name] was last seen on 06/21/2017 for a procedure. During today's appointment we reviewed Caleb [name] post-procedure results, as well as his outpatient medication regimen.  Further details on both, my assessment(s), as well as the proposed treatment plan, please see below.  Controlled Substance Pharmacotherapy Assessment REMS (Risk Evaluation and Mitigation Strategy)  Analgesic: Tramadol 50 mg 1 tablet p.o. twice daily MME/day: 10 mg/day.  Dewayne Shorter, RN  06/30/2017 10:03 AM  Signed Nursing Pain Medication Assessment:  Safety precautions to be maintained throughout the outpatient stay will include: orient to surroundings, keep bed in low position, maintain call bell within reach at all times, provide assistance with transfer out of bed and ambulation.  Medication Inspection Compliance: Pill count conducted under aseptic conditions, in front of the patient. Neither the  pills nor the bottle was removed from the patient's sight at any time. Once count was completed pills  were immediately returned to the patient in their original bottle.  Medication: Tramadol (Ultram) Pill/Patch Count: 33 of 46 pills remain Pill/Patch Appearance: Markings consistent with prescribed medication Bottle Appearance: Standard pharmacy container. Clearly labeled. Filled Date: 06 / 21 / 2019 Last Medication intake:  Today   Pharmacokinetics: Liberation and absorption (onset of action): WNL Distribution (time to peak effect): WNL Metabolism and excretion (duration of action): WNL         Pharmacodynamics: Desired effects: Analgesia: Caleb [name] reports >50% benefit. Functional ability: Patient reports that medication allows him to accomplish basic ADLs Clinically meaningful improvement in function (CMIF): Sustained CMIF goals met Perceived effectiveness: Described as relatively effective, allowing for increase in activities of daily living (ADL) Undesirable effects: Side-effects or Adverse reactions: None reported Monitoring: [place] PMP: Online review of the past 61-monthperiod conducted. Compliant with practice rules and regulations Last UDS on record: Summary  Date Value Ref Range Status  02/17/2017 FINAL  Final    Comment:    ==================================================================== TOXAS[facility]RE COMP DRUG ANALYSIS,UR ==================================================================== Test                             Result       Flag       Units Drug Present and Declared for Prescription Verification   Tramadol                       >5556        EXPECTED   ng/mg creat   O-Desmethyltramadol            4917         EXPECTED   ng/mg creat   N-Desmethyltramadol            659          EXPECTED   ng/mg creat    Source of tramadol is a prescription medication.    O-desmethyltramadol and N-desmethyltramadol are expected    metabolites of tramadol.   Gabapentin                      PRESENT      EXPECTED   Desmethylcyclobenzaprine       PRESENT      EXPECTED    Desmethylcyclobenzaprine is an expected metabolite of    cyclobenzaprine.   Metoprolol                     PRESENT      EXPECTED Drug Absent but Declared for Prescription Verification   Salicylate                     Not Detected UNEXPECTED    Aspirin, as indicated in the declared medication list, is not    always detected even when used as directed. ==================================================================== Test                      Result    Flag   Units      Ref Range   Creatinine              90               mg/dL      >=20 ==================================================================== Declared Medications:  The flagging and interpretation on this report are based on the  following declared medications.  Unexpected results may arise from  inaccuracies in the declared  medications.  **Note: The testing scope of this panel includes these medications:  Cyclobenzaprine  Gabapentin  Metoprolol  Tramadol  **Note: The testing scope of this panel does not include small to  moderate amounts of these reported medications:  Aspirin (Aspirin 81)  **Note: The testing scope of this panel does not include following  reported medications:  Albuterol  Fluticasone  Hydrochlorothiazide  Isosorbide Mononitrate  Rosuvastatin  Sildenafil  Tiotropium  Triamcinolone acetonide  Varenicline ==================================================================== For clinical consultation, please call [phone]. ====================================================================    UDS interpretation: Compliant          Medication Assessment Form: Reviewed. Patient indicates being compliant with therapy Treatment compliance: Compliant Risk Assessment Profile: Aberrant behavior: See prior evaluations. None observed or detected today Comorbid factors increasing risk of overdose: See prior  notes. No additional risks detected today Risk of substance use disorder (SUD): Low Opioid Risk Tool - 06/13/17 0926      Family History of Substance Abuse   Alcohol  Positive Male    Illegal Drugs  Negative    Rx Drugs  Negative      Personal History of Substance Abuse   Alcohol  Negative    Illegal Drugs  Negative    Rx Drugs  Negative      Age   Age between 65-45 years   No      History of Preadolescent Sexual Abuse   History of Preadolescent Sexual Abuse  Negative or Male      Psychological Disease   Psychological Disease  Negative    Depression  Negative      Total Score   Opioid Risk Tool Scoring  3    Opioid Risk Interpretation  Low Risk      ORT Scoring interpretation table:  Score <3 = Low Risk for SUD  Score between 4-7 = Moderate Risk for SUD  Score >8 = High Risk for Opioid Abuse   Risk Mitigation Strategies:  Patient Counseling: Covered Patient-Prescriber Agreement (PPA): Present and active  Notification to other healthcare providers: Done  Pharmacologic Plan: No change in therapy, at this time.             Post-Procedure Assessment  06/21/2017 Procedure: Therapeutic right-sided common peroneal nerve RFA #1 under fluoroscopic guidance and IV sedation Pre-procedure pain score:        /10 Post-procedure pain score: 0/10         Influential Factors: BMI: 30.41 kg/m Intra-procedural challenges: None observed.         Assessment challenges: None detected.              Reported side-effects: None.        Post-procedural adverse reactions or complications: None reported         Sedation: Please see nurses note. When no sedatives are used, the analgesic levels obtained are directly associated to the effectiveness of the local anesthetics. However, when sedation is provided, the level of analgesia obtained during the initial 1 hour following the intervention, is believed to be the result of a combination of factors. These factors may include, but are not  limited to: 1. The effectiveness of the local anesthetics used. 2. The effects of the analgesic(s) and/or anxiolytic(s) used. 3. The degree of discomfort experienced by the patient at the time of the procedure. 4. The patients ability and reliability in recalling and recording the events. 5. The presence and influence of possible secondary gains and/or psychosocial factors. Reported result: Relief experienced during the 1st  hour after the procedure: 100 % (Ultra-Short Term Relief)            Interpretative annotation: Clinically appropriate result. Analgesia during this period is likely to be Local Anesthetic and/or IV Sedative (Analgesic/Anxiolytic) related.          Effects of local anesthetic: The analgesic effects attained during this period are directly associated to the localized infiltration of local anesthetics and therefore cary significant diagnostic value as to the etiological location, or anatomical origin, of the pain. Expected duration of relief is directly dependent on the pharmacodynamics of the local anesthetic used. Long-acting (4-6 hours) anesthetics used.  Reported result: Relief during the next 4 to 6 hour after the procedure: 100 % (Short-Term Relief)            Interpretative annotation: Clinically appropriate result. Analgesia during this period is likely to be Local Anesthetic-related.          Long-term benefit: Defined as the period of time past the expected duration of local anesthetics (1 hour for short-acting and 4-6 hours for long-acting). With the possible exception of prolonged sympathetic blockade from the local anesthetics, benefits during this period are typically attributed to, or associated with, other factors such as analgesic sensory neuropraxia, antiinflammatory effects, or beneficial biochemical changes provided by agents other than the local anesthetics.  Reported result: Extended relief following procedure: (S) 100 %(lasting 3 days) (Long-Term Relief)             Interpretative annotation: Clinically appropriate result. Good relief. No permanent benefit expected. Inflammation plays a part in the etiology to the pain.          Current benefits: Defined as reported results that persistent at this point in time.   Analgesia: 75-100 %            Function: Mr. Colpitts reports improvement in function ROM: Fused Interpretative annotation: Ongoing benefit. Therapeutic success. Adequate RF ablation.          Interpretation: Results would suggest a successful therapeutic intervention.                  Plan:  The patient will return to the clinics for the treatment of his Cervical problems.                Laboratory Chemistry  Inflammation Markers (CRP: Acute Phase) (ESR: Chronic Phase) Lab Results  Component Value Date   CRP 7.3 (H) 02/17/2017   ESRSEDRATE 38 (H) 02/17/2017                         Rheumatology Markers Lab Results  Component Value Date   RF <10.0 02/28/2017   ANA Negative 02/28/2017                        Renal Function Markers Lab Results  Component Value Date   BUN 18 05/17/2017   CREATININE 0.78 05/17/2017   BCR 22 (H) 02/17/2017   GFRAA >60 05/17/2017   GFRNONAA >60 05/17/2017                             Hepatic Function Markers Lab Results  Component Value Date   AST 19 02/17/2017   ALT 13 10/21/2016   ALBUMIN 4.4 02/17/2017   ALKPHOS 89 02/17/2017  Electrolytes Lab Results  Component Value Date   NA 136 05/17/2017   K 3.8 05/17/2017   CL 95 (L) 05/17/2017   CALCIUM 10.2 05/17/2017   MG 2.0 02/17/2017                        Neuropathy Markers Lab Results  Component Value Date   VITAMINB12 403 02/17/2017   HIV Non Reactive 02/04/2017                        Bone Pathology Markers Lab Results  Component Value Date   25OHVITD1 32 02/17/2017   25OHVITD2 <1.0 02/17/2017   25OHVITD3 32 02/17/2017   TESTOFREE 5.2 (L) 02/28/2017   TESTOSTERONE 279 02/28/2017                          Coagulation Parameters Lab Results  Component Value Date   PLT 269 05/17/2017                        Cardiovascular Markers Lab Results  Component Value Date   TROPONINI <0.03 05/17/2017   HGB 16.8 05/17/2017   HCT 49.5 05/17/2017                         Note: Lab results reviewed.  Recent Diagnostic Imaging Results  DG C-Arm 1-60 Min-No Report Fluoroscopy was utilized by the requesting physician.  No radiographic  interpretation.   Complexity Note: I personally reviewed the fluoroscopic imaging of the procedure.                        Meds   Current Outpatient Medications:  .  Adalimumab (HUMIRA PEN) 40 MG/0.4ML PNKT, Inject into the skin every 14 (fourteen) days., Disp: , Rfl:  .  albuterol (PROVENTIL HFA;VENTOLIN HFA) 108 (90 Base) MCG/ACT inhaler, Inhale 1-2 puffs every 6 (six) hours as needed into the lungs for wheezing or shortness of breath., Disp: 1 Inhaler, Rfl: 5 .  aspirin EC 81 MG tablet, Take 81 mg by mouth daily., Disp: , Rfl:  .  CHANTIX CONTINUING MONTH PAK 1 MG tablet, TAKE 1 TABLET BY MOUTH TWICE A DAY, Disp: 56 tablet, Rfl: 3 .  cyclobenzaprine (FLEXERIL) 10 MG tablet, Take 1 tablet (10 mg total) by mouth at bedtime., Disp: 30 tablet, Rfl: 2 .  fluticasone (FLOVENT HFA) 220 MCG/ACT inhaler, Inhale 1 puff 2 (two) times daily into the lungs., Disp: 1 Inhaler, Rfl: 5 .  gabapentin (NEURONTIN) 300 MG capsule, Take 3 capsules (900 mg total) by mouth 4 (four) times daily., Disp: 360 capsule, Rfl: 2 .  hydrochlorothiazide (HYDRODIURIL) 25 MG tablet, Take 1 tablet (25 mg total) by mouth daily., Disp: 90 tablet, Rfl: 3 .  isosorbide mononitrate (IMDUR) 30 MG 24 hr tablet, TAKE 0.5 TABLET(S) EVERY DAY BY ORAL ROUTE IN THE MORNING., Disp: 15 tablet, Rfl: 5 .  losartan (COZAAR) 50 MG tablet, Take 1 tablet (50 mg total) by mouth daily., Disp: 30 tablet, Rfl: 5 .  metoprolol tartrate (LOPRESSOR) 25 MG tablet, TAKE 1 TABLET BY MOUTH TWICE A DAY, Disp: 60 tablet, Rfl:  2 .  nitroGLYCERIN (NITROSTAT) 0.4 MG SL tablet, Place 1 tablet (0.4 mg total) under the tongue every 5 (five) minutes as needed for chest pain., Disp: 30 tablet, Rfl: 3 .  rosuvastatin (CRESTOR) 40 MG  tablet, TAKE 1 TABLET BY MOUTH EVERY DAY, Disp: 90 tablet, Rfl: 3 .  tiotropium (SPIRIVA) 18 MCG inhalation capsule, Place 1 capsule (18 mcg total) daily into inhaler and inhale., Disp: 30 capsule, Rfl: 5 .  traMADol (ULTRAM) 50 MG tablet, Take 1 tablet (50 mg total) by mouth 2 (two) times daily., Disp: 60 tablet, Rfl: 2 .  triamcinolone ointment (KENALOG) 0.5 %, Apply 1 application 2 (two) times daily topically. On psoriasis lesions, Disp: 30 g, Rfl: 2 No current facility-administered medications for this visit.   Facility-Administered Medications Ordered in Other Visits:  .  dexamethasone (DECADRON) injection 10 mg, 10 mg, Other, Once, Milinda Pointer, MD  ROS  Constitutional: Denies any fever or chills Gastrointestinal: No reported hemesis, hematochezia, vomiting, or acute GI distress Musculoskeletal: Denies any acute onset joint swelling, redness, loss of ROM, or weakness Neurological: No reported episodes of acute onset apraxia, aphasia, dysarthria, agnosia, amnesia, paralysis, loss of coordination, or loss of consciousness  Allergies  Mr. Hodgman is allergic to lisinopril.  [place]  Drug: Mr. Fitzhenry  reports that he does not use drugs. Alcohol:  reports that he drinks about 2.4 oz of alcohol per week. Tobacco:  reports that he has been smoking cigarettes.  He has a 24.50 pack-year smoking history. He has never used smokeless tobacco. Medical:  has a past medical history of CHF (congestive heart failure) ([place]), Chronic foot pain, right (2015), COPD (chronic obstructive pulmonary disease) ([place]), Coronary artery disease, Hyperlipidemia, Hypertension, Myocardial infarction ([place]) (2015), and OSA on CPAP. Surgical: Mr. Gallardo  has a past surgical history that includes Cardiac catheterization  (2015); Lumbar laminectomy (1989, 1999); Cervical fusion (1988, 1998); Liver surgery (2015); Partial colectomy (1990); Inguinal hernia repair (Bilateral, 1975); Lithotripsy; and Colonoscopy with propofol (N/A, 02/24/2017). Family: family history includes Alzheimer's disease in his paternal grandmother; Alzheimer's disease (age of onset: 3) in his father; Breast cancer in his maternal uncle; CAD in his mother; Diabetes in his maternal grandmother; Healthy in his sister; Heart attack in his maternal uncle; Heart failure in his maternal grandmother; Heart failure (age of onset: 19) in his mother; Non-Hodgkin's lymphoma in his sister.  Constitutional Exam  General appearance: Well nourished, well developed, and well hydrated. In no apparent acute distress Vitals:   06/30/17 1005  BP: 116/83  Pulse: 66  Resp: 18  Temp: 97.7 F (36.5 C)  SpO2: 99%  Weight: 200 lb (90.7 kg)  Height: 5' 8" (1.727 m)   BMI Assessment: Estimated body mass index is 30.41 kg/m as calculated from the following:   Height as of this encounter: 5' 8" (1.727 m).   Weight as of this encounter: 200 lb (90.7 kg).  BMI interpretation table: BMI level Category Range association with higher incidence of chronic pain  <18 kg/m2 Underweight   18.5-24.9 kg/m2 Ideal body weight   25-29.9 kg/m2 Overweight Increased incidence by 20%  30-34.9 kg/m2 Obese (Class I) Increased incidence by 68%  35-39.9 kg/m2 Severe obesity (Class II) Increased incidence by 136%  >40 kg/m2 Extreme obesity (Class III) Increased incidence by 254%   Patient's current BMI Ideal Body weight  Body mass index is 30.41 kg/m. Ideal body weight: 68.4 kg (150 lb 12.7 oz) Adjusted ideal body weight: 77.3 kg (170 lb 7.6 oz)   BMI Readings from Last 4 Encounters:  06/30/17 30.41 kg/m  06/21/17 30.41 kg/m  06/13/17 30.41 kg/m  06/02/17 30.87 kg/m   Wt Readings from Last 4 Encounters:  06/30/17 200 lb (90.7 kg)  06/21/17 200  lb (90.7 kg)  06/13/17 200  lb (90.7 kg)  06/02/17 203 lb (92.1 kg)  Psych/Mental status: Alert, oriented x 3 (person, place, & time)       Eyes: PERLA Respiratory: No evidence of acute respiratory distress  Cervical Spine Area Exam  Skin & Axial Inspection: No masses, redness, edema, swelling, or associated skin lesions Alignment: Symmetrical Functional ROM: Decreased ROM      Stability: No instability detected Muscle Tone/Strength: Guarding observed Sensory (Neurological): Movement-associated pain Palpation: Complains of area being tender to palpation              Upper Extremity (UE) Exam    Side: Right upper extremity  Side: Left upper extremity  Skin & Extremity Inspection: Skin color, temperature, and hair growth are WNL. No peripheral edema or cyanosis. No masses, redness, swelling, asymmetry, or associated skin lesions. No contractures.  Skin & Extremity Inspection: Skin color, temperature, and hair growth are WNL. No peripheral edema or cyanosis. No masses, redness, swelling, asymmetry, or associated skin lesions. No contractures.  Functional ROM: Unrestricted ROM          Functional ROM: Unrestricted ROM          Muscle Tone/Strength: Functionally intact. No obvious neuro-muscular anomalies detected.  Muscle Tone/Strength: Functionally intact. No obvious neuro-muscular anomalies detected.  Sensory (Neurological): Unimpaired          Sensory (Neurological): Unimpaired          Palpation: No palpable anomalies              Palpation: No palpable anomalies              Provocative Test(s):  Phalen's test: deferred Tinel's test: deferred Apley's scratch test (touch opposite shoulder):  Action 1 (Across chest): deferred Action 2 (Overhead): deferred Action 3 (LB reach): deferred   Provocative Test(s):  Phalen's test: deferred Tinel's test: deferred Apley's scratch test (touch opposite shoulder):  Action 1 (Across chest): deferred Action 2 (Overhead): deferred Action 3 (LB reach): deferred    Thoracic  Spine Area Exam  Skin & Axial Inspection: No masses, redness, or swelling Alignment: Symmetrical Functional ROM: Unrestricted ROM Stability: No instability detected Muscle Tone/Strength: Functionally intact. No obvious neuro-muscular anomalies detected. Sensory (Neurological): Unimpaired Muscle strength & Tone: No palpable anomalies  Lumbar Spine Area Exam  Skin & Axial Inspection: No masses, redness, or swelling Alignment: Symmetrical Functional ROM: Unrestricted ROM       Stability: No instability detected Muscle Tone/Strength: Functionally intact. No obvious neuro-muscular anomalies detected. Sensory (Neurological): Unimpaired Palpation: No palpable anomalies       Provocative Tests: Lumbar Hyperextension/rotation test: deferred today       Lumbar quadrant test (Kemp's test): deferred today       Lumbar Lateral bending test: deferred today       Patrick's Maneuver: deferred today                   FABER test: deferred today       Thigh-thrust test: deferred today       S-I compression test: deferred today       S-I distraction test: deferred today        Gait & Posture Assessment  Ambulation: Unassisted Gait: Relatively normal for age and body habitus Posture: WNL   Lower Extremity Exam    Side: Right lower extremity  Side: Left lower extremity  Stability: No instability observed          Stability: No instability  observed          Skin & Extremity Inspection: Skin color, temperature, and hair growth are WNL. No peripheral edema or cyanosis. No masses, redness, swelling, asymmetry, or associated skin lesions. No contractures.  Skin & Extremity Inspection: Skin color, temperature, and hair growth are WNL. No peripheral edema or cyanosis. No masses, redness, swelling, asymmetry, or associated skin lesions. No contractures.  Functional ROM: Unrestricted ROM                  Functional ROM: Unrestricted ROM                  Muscle Tone/Strength: Functionally intact. No obvious  neuro-muscular anomalies detected.  Muscle Tone/Strength: Functionally intact. No obvious neuro-muscular anomalies detected.  Sensory (Neurological): Unimpaired  Sensory (Neurological): Unimpaired  Palpation: No palpable anomalies  Palpation: No palpable anomalies   Assessment  Primary Diagnosis & Pertinent Problem List: The primary encounter diagnosis was Chronic foot pain (Primary Area of Pain) (Right). Diagnoses of Chronic ankle pain (Secondary Area of Pain) (Right), Disorder of superficial peroneal nerve (Right), Cervical disc disorder with radiculopathy of cervical region, DDD (degenerative disc disease), cervical, Chronic musculoskeletal pain, Chronic pain syndrome, and Neurogenic pain of right foot were also pertinent to this visit.  Status Diagnosis  Controlled Controlled Controlled 1. Chronic foot pain (Primary Area of Pain) (Right)   2. Chronic ankle pain (Secondary Area of Pain) (Right)   3. Disorder of superficial peroneal nerve (Right)   4. Cervical disc disorder with radiculopathy of cervical region   5. DDD (degenerative disc disease), cervical   6. Chronic musculoskeletal pain   7. Chronic pain syndrome   8. Neurogenic pain of right foot     Problems updated and reviewed during this visit: No problems updated. Plan of Care  Pharmacotherapy (Medications Ordered): Meds ordered this encounter  Medications  . cyclobenzaprine (FLEXERIL) 10 MG tablet    Sig: Take 1 tablet (10 mg total) by mouth at bedtime.    Dispense:  30 tablet    Refill:  2    Do not place medication on "Automatic Refill". Fill one day early if pharmacy is closed on scheduled refill date.  . traMADol (ULTRAM) 50 MG tablet    Sig: Take 1 tablet (50 mg total) by mouth 2 (two) times daily.    Dispense:  60 tablet    Refill:  2    Fill one day early if pharmacy is closed on scheduled refill date. Do not fill until: 06/30/17 To last until: 09/28/17  . DISCONTD: gabapentin (NEURONTIN) 300 MG capsule     Sig: Take 3 capsules (900 mg total) by mouth 4 (four) times daily.    Dispense:  360 capsule    Refill:  2    Do not place medication on "Automatic Refill". Fill one day early if pharmacy is closed on scheduled refill date.  . gabapentin (NEURONTIN) 300 MG capsule    Sig: Take 3 capsules (900 mg total) by mouth 4 (four) times daily.    Dispense:  360 capsule    Refill:  2    Do not place medication on "Automatic Refill". Fill one day early if pharmacy is closed on scheduled refill date.   Medications administered today: Jason L. Lipsitz had no medications administered during this visit.  Procedure Orders    No procedure(s) ordered today   Lab Orders  No laboratory test(s) ordered today   Imaging Orders  No imaging studies ordered today  Referral Orders  No referral(s) requested today    Interventional management options: Planned, scheduled, and/or pending:   Diagnostic left sided cervical epidural steroid injection #3 under fluoroscopic guidance and IV sedation   Considering:   Diagnostic right ankle block Diagnosticright lumbar sympathetic block Diagnostic midlinethoracic epidural steroid injection Diagnostic bilateral thoracic facet block Possible bilateral thoracic facet RFA Diagnostic cervical epidural steroid injection Diagnostic bilateral cervical facet block Possible bilateral cervical facet RFA   Palliative PRN treatment(s):   Palliative Right Common Peroneal Nerve RFA (last done on 06/21/17)   Provider-requested follow-up: Return for Procedure (w/ sedation): (L) CESI #3.  Future Appointments  Date Time Provider [place]  07/14/2017  9:30 AM Milinda Pointer, MD [facility] None  09/02/2017  8:20 AM Virginia Crews, MD [facility] None  09/28/2017  9:30 AM Vevelyn Francois, NP [facility] None   Primary Care Physician: Virginia Crews, MD Location: [facility] Note by: Gaspar Cola, MD Date:  06/30/2017; Time: 11:02 AM

## 2017-06-30 NOTE — Patient Instructions (Addendum)
_____You have been given a script for Tramadol today.  Your other medications have been sent to pharmacy.  _______________________________________________________________________________________  Medication Rules  Applies to: All patients receiving prescriptions (written or electronic).  Pharmacy of record: Pharmacy where electronic prescriptions will be sent. If written prescriptions are taken to a different pharmacy, please inform the nursing staff. The pharmacy listed in the electronic medical record should be the one where you would like electronic prescriptions to be sent.  Prescription refills: Only during scheduled appointments. Applies to both, written and electronic prescriptions.  NOTE: The following applies primarily to controlled substances (Opioid* Pain Medications).   Patient's responsibilities: 1. Pain Pills: Bring all pain pills to every appointment (except for procedure appointments). 2. Pill Bottles: Bring pills in original pharmacy bottle. Always bring newest bottle. Bring bottle, even if empty. 3. Medication refills: You are responsible for knowing and keeping track of what medications you need refilled. The day before your appointment, write a list of all prescriptions that need to be refilled. Bring that list to your appointment and give it to the admitting nurse. Prescriptions will be written only during appointments. If you forget a medication, it will not be "Called in", "Faxed", or "electronically sent". You will need to get another appointment to get these prescribed. 4. Prescription Accuracy: You are responsible for carefully inspecting your prescriptions before leaving our office. Have the discharge nurse carefully go over each prescription with you, before taking them home. Make sure that your name is accurately spelled, that your address is correct. Check the name and dose of your medication to make sure it is accurate. Check the number of pills, and the written  instructions to make sure they are clear and accurate. Make sure that you are given enough medication to last until your next medication refill appointment. 5. Taking Medication: Take medication as prescribed. Never take more pills than instructed. Never take medication more frequently than prescribed. Taking less pills or less frequently is permitted and encouraged, when it comes to controlled substances (written prescriptions).  6. Inform other Doctors: Always inform, all of your healthcare providers, of all the medications you take. 7. Pain Medication from other Providers: You are not allowed to accept any additional pain medication from any other Doctor or Healthcare provider. There are two exceptions to this rule. (see below) In the event that you require additional pain medication, you are responsible for notifying us, as stated below. 8. Medication Agreement: You are responsible for carefully reading and following our Medication Agreement. This must be signed before receiving any prescriptions from our practice. Safely store a copy of your signed Agreement. Violations to the Agreement will result in no further prescriptions. (Additional copies of our Medication Agreement are available upon request.) 9. Laws, Rules, & Regulations: All patients are expected to follow all Federal and [employer], [employer], Rules, [employer]. Ignorance of the Laws does not constitute a valid excuse. The use of any illegal substances is prohibited. 10. Adopted CDC guidelines & recommendations: Target dosing levels will be at or below 60 MME/day. Use of benzodiazepines** is not recommended.  Exceptions: There are only two exceptions to the rule of not receiving pain medications from other Healthcare Providers. 1. Exception #1 (Emergencies): In the event of an emergency (i.e.: accident requiring emergency care), you are allowed to receive additional pain medication. However, you are responsible for: As soon as you are  able, call our office [phone], at any time of the day or night, and leave a  message stating your name, the date and nature of the emergency, and the name and dose of the medication prescribed. In the event that your call is answered by a member of our staff, make sure to document and save the date, time, and the name of the person that took your information.  2. Exception #2 (Planned Surgery): In the event that you are scheduled by another doctor or dentist to have any type of surgery or procedure, you are allowed (for a period no longer than 30 days), to receive additional pain medication, for the acute post-op pain. However, in this case, you are responsible for picking up a copy of our "Post-op Pain Management for Surgeons" handout, and giving it to your surgeon or dentist. This document is available at our office, and does not require an appointment to obtain it. Simply go to our office during business hours (Monday-Thursday from 8:00 AM to 4:00 PM) (Friday 8:00 AM to 12:00 Noon) or if you have a scheduled appointment with [place], prior to your surgery, and ask for it by name. In addition, you will need to provide [place] with your name, name of your surgeon, type of surgery, and date of procedure or surgery.  *Opioid medications include: morphine, codeine, oxycodone, oxymorphone, hydrocodone, hydromorphone, meperidine, tramadol, tapentadol, buprenorphine, fentanyl, methadone. **Benzodiazepine medications include: diazepam (Valium), alprazolam (Xanax), clonazepam (Klonopine), lorazepam (Ativan), clorazepate (Tranxene), chlordiazepoxide (Librium), estazolam (Prosom), oxazepam (Serax), temazepam (Restoril), triazolam (Halcion) (Last updated: 03/03/2017) ____________________________________________________________________________________________   ____________________________________________________________________________________________  Medication Recommendations and Reminders  Applies to: All  patients receiving prescriptions (written and/or electronic).  Medication Rules & Regulations: These rules and regulations exist for your safety and that of others. They are not flexible and neither are we. Dismissing or ignoring them will be considered "non-compliance" with medication therapy, resulting in complete and irreversible termination of such therapy. (See document titled "Medication Rules" for more details.) In all conscience, because of safety reasons, we cannot continue providing a therapy where the patient does not follow instructions.  Pharmacy of record:   Definition: This is the pharmacy where your electronic prescriptions will be sent.   We do not endorse any particular pharmacy.  You are not restricted in your choice of pharmacy.  The pharmacy listed in the electronic medical record should be the one where you want electronic prescriptions to be sent.  If you choose to change pharmacy, simply notify our nursing staff of your choice of new pharmacy.  Recommendations:  Keep all of your pain medications in a safe place, under lock and key, even if you live alone.   After you fill your prescription, take 1 week's worth of pills and put them away in a safe place. You should keep a separate, properly labeled bottle for this purpose. The remainder should be kept in the original bottle. Use this as your primary supply, until it runs out. Once it's gone, then you know that you have 1 week's worth of medicine, and it is time to come in for a prescription refill. If you do this correctly, it is unlikely that you will ever run out of medicine.  To make sure that the above recommendation works, it is very important that you make sure your medication refill appointments are scheduled at least 1 week before you run out of medicine. To do this in an effective manner, make sure that you do not leave the office without scheduling your next medication management appointment. Always ask the  nursing staff to show you in your  prescription , when your medication will be running out. Then arrange for the receptionist to get you a return appointment, at least 7 days before you run out of medicine. Do not wait until you have 1 or 2 pills left, to come in. This is very poor planning and does not take into consideration that we may need to cancel appointments due to bad weather, sickness, or emergencies affecting our staff.  Prescription refills and/or changes in medication(s):   Prescription refills, and/or changes in dose or medication, will be conducted only during scheduled medication management appointments. (Applies to both, written and electronic prescriptions.)  No refills on procedure days. No medication will be changed or started on procedure days. No changes, adjustments, and/or refills will be conducted on a procedure day. Doing so will interfere with the diagnostic portion of the procedure.  No phone refills. No medications will be "called into the pharmacy".  No Fax refills.  No weekend refills.  No Holliday refills.  No after hours refills.  Remember:  Business hours are:  Monday to Thursday 8:00 AM to 4:00 PM Provider's Schedule: Dionisio David, NP - Appointments are:  Medication management: Monday to Thursday 8:00 AM to 4:00 PM Milinda Pointer, MD - Appointments are:  Medication management: Monday and Wednesday 8:00 AM to 4:00 PM Procedure day: Tuesday and Thursday 7:30 AM to 4:00 PM Gillis Santa, MD - Appointments are:  Medication management: Tuesday and Thursday 8:00 AM to 4:00 PM Procedure day: Monday and Wednesday 7:30 AM to 4:00 PM (Last update: 03/03/2017) ____________________________________________________________________________________________   ____________________________________________________________________________________________  CANNABIDIOL (AKA: CBD Oil or Pills)  Applies to: All patients receiving prescriptions of controlled substances  (written and/or electronic).  General Information: Cannabidiol (CBD) was discovered in 10. It is one of some 113 identified cannabinoids in cannabis (Marijuana) plants, accounting for up to 40% of the plant's extract. As of 2018, preliminary clinical research on cannabidiol included studies of anxiety, cognition, movement disorders, and pain.  Cannabidiol is consummed in multiple ways, including inhalation of cannabis smoke or vapor, as an aerosol spray into the cheek, and by mouth. It may be supplied as CBD oil containing CBD as the active ingredient (no added tetrahydrocannabinol (THC) or terpenes), a full-plant CBD-dominant hemp extract oil, capsules, dried cannabis, or as a liquid solution. CBD is thought not have the same psychoactivity as THC, and may affect the actions of THC. Studies suggest that CBD may interact with different biological targets, including cannabinoid receptors and other neurotransmitter receptors. As of 2018 the mechanism of action for its biological effects has not been determined.  In the [place], cannabidiol has a limited approval by the Food and Drug Administration (FDA) for treatment of only two types of epilepsy disorders. The side effects of long-term use of the drug include somnolence, decreased appetite, diarrhea, fatigue, malaise, weakness, sleeping problems, and others.  CBD remains a Schedule I drug prohibited for any use.  Legality: Some manufacturers ship CBD products nationally, an illegal action which the FDA has not enforced in 2018, with CBD remaining the subject of an FDA investigational new drug evaluation, and is not considered legal as a dietary supplement or food ingredient as of December 2018. Federal illegality has made it difficult historically to conduct research on CBD. CBD is openly sold in head shops and health food stores in some states where such sales have not been explicitly legalized.  Warning: Because it is not FDA approved for  general use or treatment of pain, it is not  required to undergo the same manufacturing controls as prescription drugs.  This means that the available cannabidiol (CBD) may be contaminated with THC.  If this is the case, it will trigger a positive urine drug screen (UDS) test for cannabinoids (Marijuana).  Because a positive UDS for illicit substances is a violation of our medication agreement, your opioid analgesics (pain medicine) may be permanently discontinued. (Last update: 03/24/2017) ____________________________________________________________________________________________   ____________________________________________________________________________________________  Preparing for Procedure with Sedation  Instructions: . Oral Intake: Do not eat or drink anything for at least 8 hours prior to your procedure. . Transportation: Public transportation is not allowed. Bring an adult driver. The driver must be physically present in our waiting room before any procedure can be started. [place] Physical Assistance: Bring an adult physically capable of assisting you, in the event you need help. This adult should keep you company at home for at least 6 hours after the procedure. . Blood Pressure Medicine: Take your blood pressure medicine with a sip of water the morning of the procedure. . Blood thinners:  . Diabetics on insulin: Notify the staff so that you can be scheduled 1st case in the morning. If your diabetes requires high dose insulin, take only  of your normal insulin dose the morning of the procedure and notify the staff that you have done so. . Preventing infections: Shower with an antibacterial soap the morning of your procedure. . Build-up your immune system: Take 1000 mg of Vitamin C with every meal (3 times a day) the day prior to your procedure. [place] Antibiotics: Inform the staff if you have a condition or reason that requires you to take antibiotics before dental procedures. . Pregnancy: If  you are pregnant, call and cancel the procedure. . Sickness: If you have a cold, fever, or any active infections, call and cancel the procedure. . Arrival: You must be in the facility at least 30 minutes prior to your scheduled procedure. . Children: Do not bring children with you. . Dress appropriately: Bring dark clothing that you would not mind if they get stained. . Valuables: Do not bring any jewelry or valuables.  Procedure appointments are reserved for interventional treatments only. [place] No Prescription Refills. . No medication changes will be discussed during procedure appointments. . No disability issues will be discussed.  Remember:  Regular Business hours are:  Monday to Thursday 8:00 AM to 4:00 PM  Provider's Schedule: Milinda Pointer, MD:  Procedure days: Tuesday and Thursday 7:30 AM to 4:00 PM  Gillis Santa, MD:  Procedure days: Monday and Wednesday 7:30 AM to 4:00 PM ____________________________________________________________________________________________

## 2017-06-30 NOTE — Patient Instructions (Signed)

## 2017-07-06 ENCOUNTER — Ambulatory Visit: Payer: Medicaid Other | Admitting: Nurse Practitioner

## 2017-07-14 ENCOUNTER — Encounter: Payer: Self-pay | Admitting: Pain Medicine

## 2017-07-14 ENCOUNTER — Ambulatory Visit: Payer: Medicaid Other | Admitting: Pain Medicine

## 2017-07-14 ENCOUNTER — Other Ambulatory Visit: Payer: Self-pay

## 2017-07-14 ENCOUNTER — Ambulatory Visit
Admission: RE | Admit: 2017-07-14 | Discharge: 2017-07-14 | Disposition: A | Discharge status: Home / Self Care | Payer: Medicaid Other | Source: Ambulatory Visit | Attending: Pain Medicine | Admitting: Pain Medicine

## 2017-07-14 VITALS — BP 107/88 | HR 60 | Temp 98.7°F | Resp 14 | Ht 68.0 in | Wt 200.0 lb

## 2017-07-14 DIAGNOSIS — M9981 Other biomechanical lesions of cervical region: Secondary | ICD-10-CM | POA: Insufficient documentation

## 2017-07-14 DIAGNOSIS — M4722 Other spondylosis with radiculopathy, cervical region: Secondary | ICD-10-CM | POA: Insufficient documentation

## 2017-07-14 DIAGNOSIS — M4802 Spinal stenosis, cervical region: Secondary | ICD-10-CM | POA: Diagnosis not present

## 2017-07-14 DIAGNOSIS — M4803 Spinal stenosis, cervicothoracic region: Secondary | ICD-10-CM | POA: Insufficient documentation

## 2017-07-14 DIAGNOSIS — G8929 Other chronic pain: Secondary | ICD-10-CM | POA: Diagnosis not present

## 2017-07-14 DIAGNOSIS — M5010 Cervical disc disorder with radiculopathy, unspecified cervical region: Secondary | ICD-10-CM | POA: Insufficient documentation

## 2017-07-14 DIAGNOSIS — Z888 Allergy status to other drugs, medicaments and biological substances status: Secondary | ICD-10-CM | POA: Diagnosis not present

## 2017-07-14 DIAGNOSIS — M5030 Other cervical disc degeneration, unspecified cervical region: Secondary | ICD-10-CM

## 2017-07-14 DIAGNOSIS — Z9049 Acquired absence of other specified parts of digestive tract: Secondary | ICD-10-CM | POA: Insufficient documentation

## 2017-07-14 DIAGNOSIS — M79602 Pain in left arm: Secondary | ICD-10-CM | POA: Diagnosis not present

## 2017-07-14 DIAGNOSIS — Z981 Arthrodesis status: Secondary | ICD-10-CM | POA: Diagnosis not present

## 2017-07-14 MED ORDER — SODIUM CHLORIDE 0.9 % IJ SOLN
INTRAMUSCULAR | Status: AC
  Filled 2017-07-14: qty 10

## 2017-07-14 MED ORDER — MIDAZOLAM HCL 5 MG/5ML IJ SOLN
1.0000 mg | INTRAMUSCULAR | Status: DC | PRN
  Administered 2017-07-14: 2 mg via INTRAVENOUS
  Filled 2017-07-14: qty 5

## 2017-07-14 MED ORDER — IOPAMIDOL (ISOVUE-M 200) INJECTION 41%
10.0000 mL | Freq: Once | INTRAMUSCULAR | Status: AC
  Administered 2017-07-14: 10 mL via EPIDURAL
  Filled 2017-07-14: qty 10

## 2017-07-14 MED ORDER — SODIUM CHLORIDE 0.9% FLUSH
1.0000 mL | Freq: Once | INTRAVENOUS | Status: AC
  Administered 2017-07-14: 1 mL

## 2017-07-14 MED ORDER — FENTANYL CITRATE (PF) 100 MCG/2ML IJ SOLN
25.0000 ug | INTRAMUSCULAR | Status: DC | PRN
  Administered 2017-07-14: 50 ug via INTRAVENOUS
  Filled 2017-07-14: qty 2

## 2017-07-14 MED ORDER — ROPIVACAINE HCL 2 MG/ML IJ SOLN
1.0000 mL | Freq: Once | INTRAMUSCULAR | Status: AC
  Administered 2017-07-14: 1 mL via EPIDURAL
  Filled 2017-07-14: qty 10

## 2017-07-14 MED ORDER — LIDOCAINE HCL 2 % IJ SOLN
20.0000 mL | Freq: Once | INTRAMUSCULAR | Status: AC
  Administered 2017-07-14: 400 mg
  Filled 2017-07-14: qty 40

## 2017-07-14 MED ORDER — DEXAMETHASONE SODIUM PHOSPHATE 10 MG/ML IJ SOLN
10.0000 mg | Freq: Once | INTRAMUSCULAR | Status: AC
  Administered 2017-07-14: 10 mg
  Filled 2017-07-14: qty 1

## 2017-07-14 MED ORDER — LACTATED RINGERS IV SOLN
1000.0000 mL | Freq: Once | INTRAVENOUS | Status: AC
  Administered 2017-07-14: 1000 mL via INTRAVENOUS

## 2017-07-14 NOTE — Progress Notes (Signed)
Safety precautions to be maintained throughout the outpatient stay will include: orient to surroundings, keep bed in low position, maintain call bell within reach at all times, provide assistance with transfer out of bed and ambulation.  Currently taking Tylenol #3 and Ibuprofen after dental procedure.

## 2017-07-14 NOTE — Progress Notes (Signed)
Patient's Name: Caleb [name]  MRN: [id number]  Referring Provider: Virginia Crews, MD  DOB: [birth year]  PCP: Virginia Crews, MD  DOS: 07/14/2017  Note by: Gaspar Cola, MD  Service setting: Ambulatory outpatient  Specialty: Interventional Pain Management  Patient type: Established  Location: [facility] (AMB) Pain Management Facility  Visit type: Interventional Procedure   Primary Reason for Visit: Interventional Pain Management Treatment. CC: Neck Pain  Procedure:          Anesthesia, Analgesia, Anxiolysis:  Type: Diagnostic, Inter-Laminar, Epidural Steroid Injection #3  Region: Posterior Cervico-thoracic Region Level: C7-T1 Laterality: Left-Sided Paramedial  Type: Moderate (Conscious) Sedation combined with Local Anesthesia Indication(s): Analgesia and Anxiety Route: Intravenous (IV) IV Access: Secured Sedation: Meaningful verbal contact was maintained at all times during the procedure  Local Anesthetic: Lidocaine 1-2%   Indications: 1. DDD (degenerative disc disease), cervical   2. Cervical disc disorder with radiculopathy of cervical region   3. Cervical spondylosis with radiculopathy   4. 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 Chronic pain of left upper extremity    Pain Score: Pre-procedure: 2 /10 Post-procedure: 0-No pain/10  Pre-op Assessment:  Caleb [name] is a 62 y.o. (year old), male patient, seen today for interventional treatment. He  has a past surgical history that includes Cardiac catheterization (2015); Lumbar laminectomy (1989, 1999); Cervical fusion (1988, 1998); Liver surgery (2015); Partial colectomy (1990); Inguinal hernia repair (Bilateral, 1975); Lithotripsy; and Colonoscopy with propofol (N/A, 02/24/2017). Caleb [name] has a current medication list which includes the following prescription(s): adalimumab, albuterol, aspirin ec, chantix continuing month pak, cyclobenzaprine, fluticasone, gabapentin, hydrochlorothiazide,  isosorbide mononitrate, losartan, metoprolol tartrate, nitroglycerin, rosuvastatin, tiotropium, tramadol, and triamcinolone ointment, and the following Facility-Administered Medications: fentanyl, lactated ringers, and midazolam. His primarily concern today is the Neck Pain  Initial Vital Signs:  Pulse/HCG Rate: 60ECG Heart Rate: (!) 54 Temp: 97.8 F (36.6 C) Resp: 16 BP: 111/85 SpO2: 99 %  BMI: Estimated body mass index is 30.41 kg/m as calculated from the following:   Height as of this encounter: 5\' 8"  (1.727 m).   Weight as of this encounter: 200 lb (90.7 kg).  Risk Assessment: Allergies: Reviewed. He is allergic to lisinopril.  Allergy Precautions: None required Coagulopathies: Reviewed. None identified.  Blood-thinner therapy: None at this time Active Infection(s): Reviewed. None identified. Mr. Hallums is afebrile  Site Confirmation: Caleb [name] was asked to confirm the procedure and laterality before marking the site Procedure checklist: Completed Consent: Before the procedure and under the influence of no sedative(s), amnesic(s), or anxiolytics, the patient was informed of the treatment options, risks and possible complications. To fulfill our ethical and legal obligations, as recommended by the American Medical Association's Code of Ethics, I have informed the patient of my clinical impression; the nature and purpose of the treatment or procedure; the risks, benefits, and possible complications of the intervention; the alternatives, including doing nothing; the risk(s) and benefit(s) of the alternative treatment(s) or procedure(s); and the risk(s) and benefit(s) of doing nothing. The patient was provided information about the general risks and possible complications associated with the procedure. These may include, but are not limited to: failure to achieve desired goals, infection, bleeding, organ or nerve damage, allergic reactions, paralysis, and death. In addition, the patient  was informed of those risks and complications associated to Spine-related procedures, such as failure to decrease pain; infection (i.e.: Meningitis, epidural or intraspinal abscess); bleeding (i.e.: epidural hematoma, subarachnoid hemorrhage, or any other type of intraspinal  or peri-dural bleeding); organ or nerve damage (i.e.: Any type of peripheral nerve, nerve root, or spinal cord injury) with subsequent damage to sensory, motor, and/or autonomic systems, resulting in permanent pain, numbness, and/or weakness of one or several areas of the body; allergic reactions; (i.e.: anaphylactic reaction); and/or death. Furthermore, the patient was informed of those risks and complications associated with the medications. These include, but are not limited to: allergic reactions (i.e.: anaphylactic or anaphylactoid reaction(s)); adrenal axis suppression; blood sugar elevation that in diabetics may result in ketoacidosis or comma; water retention that in patients with history of congestive heart failure may result in shortness of breath, pulmonary edema, and decompensation with resultant heart failure; weight gain; swelling or edema; medication-induced neural toxicity; particulate matter embolism and blood vessel occlusion with resultant organ, and/or nervous system infarction; and/or aseptic necrosis of one or more joints. Finally, the patient was informed that Medicine is not an exact science; therefore, there is also the possibility of unforeseen or unpredictable risks and/or possible complications that may result in a catastrophic outcome. The patient indicated having understood very clearly. We have given the patient no guarantees and we have made no promises. Enough time was given to the patient to ask questions, all of which were answered to the patient's satisfaction. Caleb [name] has indicated that he wanted to continue with the procedure. Attestation: I, the ordering provider, attest that I have discussed with the  patient the benefits, risks, side-effects, alternatives, likelihood of achieving goals, and potential problems during recovery for the procedure that I have provided informed consent. Date  Time: 07/14/2017  9:18 AM  Pre-Procedure Preparation:  Monitoring: As per clinic protocol. Respiration, ETCO2, SpO2, BP, heart rate and rhythm monitor placed and checked for adequate function Safety Precautions: Patient was assessed for positional comfort and pressure points before starting the procedure. Time-out: I initiated and conducted the "Time-out" before starting the procedure, as per protocol. The patient was asked to participate by confirming the accuracy of the "Time Out" information. Verification of the correct person, site, and procedure were performed and confirmed by me, the nursing staff, and the patient. "Time-out" conducted as per Joint Commission's Universal Protocol (UP.01.01.01). Time: [phone]  Description of Procedure:          Position: Prone with head of the table was raised to facilitate breathing. Target Area: For Epidural Steroid injections the target is the interlaminar space, initially targeting the lower border of the superior vertebral body lamina. Approach: Paramedial approach. Area Prepped: Entire PosteriorCervical Region Prepping solution: ChloraPrep (2% chlorhexidine gluconate and 70% isopropyl alcohol) Safety Precautions: Aspiration looking for blood return was conducted prior to all injections. At no point did we inject any substances, as a needle was being advanced. No attempts were made at seeking any paresthesias. Safe injection practices and needle disposal techniques used. Medications properly checked for expiration dates. SDV (single dose vial) medications used. Description of the Procedure: Protocol guidelines were followed. The procedure needle was introduced through the skin, ipsilateral to the reported pain, and advanced to the target area. Bone was contacted and the  needle walked caudad, until the lamina was cleared. The epidural space was identified using "loss-of-resistance technique" with 2-3 ml of PF-NaCl (0.9% NSS), in a 5cc LOR glass syringe. Vitals:   07/14/17 0949 07/14/17 0958 07/14/17 1008 07/14/17 1017  BP: 113/88 98/75 96/75  107/88  Pulse:      Resp: 11 14 12 14   Temp:  97.9 F (36.6 C)  98.7 F (37.1 C)  TempSrc:      SpO2: 97% 97% 100% 100%  Weight:      Height:        Start Time: 0941 hrs. End Time: 0947 hrs. Materials:  Needle(s) Type: Epidural needle Gauge: 17G Length: 3.5-in Medication(s): Please see orders for medications and dosing details.  Imaging Guidance (Spinal):          Type of Imaging Technique: Fluoroscopy Guidance (Spinal) Indication(s): Assistance in needle guidance and placement for procedures requiring needle placement in or near specific anatomical locations not easily accessible without such assistance. Exposure Time: Please see nurses notes. Contrast: Before injecting any contrast, we confirmed that the patient did not have an allergy to iodine, shellfish, or radiological contrast. Once satisfactory needle placement was completed at the desired level, radiological contrast was injected. Contrast injected under live fluoroscopy. No contrast complications. See chart for type and volume of contrast used. Fluoroscopic Guidance: I was personally present during the use of fluoroscopy. "Tunnel Vision Technique" used to obtain the best possible view of the target area. Parallax error corrected before commencing the procedure. "Direction-depth-direction" technique used to introduce the needle under continuous pulsed fluoroscopy. Once target was reached, antero-posterior, oblique, and lateral fluoroscopic projection used confirm needle placement in all planes. Images permanently stored in EMR. Interpretation: I personally interpreted the imaging intraoperatively. Adequate needle placement confirmed in multiple planes.  Appropriate spread of contrast into desired area was observed. No evidence of afferent or efferent intravascular uptake. No intrathecal or subarachnoid spread observed. Permanent images saved into the patient's record.  Antibiotic Prophylaxis:   Anti-infectives (From admission, onward)   None     Indication(s): None identified  Post-operative Assessment:  Post-procedure Vital Signs:  Pulse/HCG Rate: 6068 Temp: 98.7 F (37.1 C) Resp: 14 BP: 107/88 SpO2: 100 %  EBL: None  Complications: No immediate post-treatment complications observed by team, or reported by patient.  Note: The patient tolerated the entire procedure well. A repeat set of vitals were taken after the procedure and the patient was kept under observation following institutional policy, for this type of procedure. Post-procedural neurological assessment was performed, showing return to baseline, prior to discharge. The patient was provided with post-procedure discharge instructions, including a section on how to identify potential problems. Should any problems arise concerning this procedure, the patient was given instructions to immediately contact us, at any time, without hesitation. In any case, we plan to contact the patient by telephone for a follow-up status report regarding this interventional procedure.  Comments:  No additional relevant information.  Plan of Care    Imaging Orders     DG C-Arm 1-60 Min-No Report  Procedure Orders     Cervical Epidural Injection  Medications ordered for procedure: Meds ordered this encounter  Medications  . iopamidol (ISOVUE-M) 41 % intrathecal injection 10 mL    Must be Myelogram-compatible. If not available, you may substitute with a water-soluble, non-ionic, hypoallergenic, myelogram-compatible radiological contrast medium.  Marland Kitchen lidocaine (XYLOCAINE) 2 % (with pres) injection 400 mg  . midazolam (VERSED) 5 MG/5ML injection 1-2 mg    Make sure Flumazenil is available in  the pyxis when using this medication. If oversedation occurs, administer 0.2 mg IV over 15 sec. If after 45 sec no response, administer 0.2 mg again over 1 min; may repeat at 1 min intervals; not to exceed 4 doses (1 mg)  . fentaNYL (SUBLIMAZE) injection 25-50 mcg    Make sure Narcan is available in the pyxis when using this medication. In the  event of respiratory depression (RR< 8/min): Titrate NARCAN (naloxone) in increments of 0.1 to 0.2 mg IV at 2-3 minute intervals, until desired degree of reversal.  . lactated ringers infusion 1,000 mL  . sodium chloride flush (NS) 0.9 % injection 1 mL  . ropivacaine (PF) 2 mg/mL (0.2%) (NAROPIN) injection 1 mL  . dexamethasone (DECADRON) injection 10 mg   Medications administered: We administered iopamidol, lidocaine, midazolam, fentaNYL, lactated ringers, sodium chloride flush, ropivacaine (PF) 2 mg/mL (0.2%), and dexamethasone.  See the medical record for exact dosing, route, and time of administration.  New Prescriptions   No medications on file   Disposition: Discharge home  Discharge Date & Time: 07/14/2017; 1020 hrs.   Physician-requested Follow-up: Return for post-procedure eval (2 wks), w/ Dr. Dossie Arbour.  Future Appointments  Date Time Provider [place]  07/27/2017  1:45 PM Milinda Pointer, MD [facility] None  09/02/2017  8:20 AM Virginia Crews, MD [facility] None  09/28/2017  9:30 AM Vevelyn Francois, NP [facility] None   Primary Care Physician: Virginia Crews, MD Location: [facility] Note by: Gaspar Cola, MD Date: 07/14/2017; Time: 10:25 AM  Disclaimer:  Medicine is not an exact science. The only guarantee in medicine is that nothing is guaranteed. It is important to note that the decision to proceed with this intervention was based on the information collected from the patient. The Data and conclusions were drawn from the patient's questionnaire, the interview, and the physical  examination. Because the information was provided in large part by the patient, it cannot be guaranteed that it has not been purposely or unconsciously manipulated. Every effort has been made to obtain as much relevant data as possible for this evaluation. It is important to note that the conclusions that lead to this procedure are derived in large part from the available data. Always take into account that the treatment will also be dependent on availability of resources and existing treatment guidelines, considered by other Pain Management Practitioners as being common knowledge and practice, at the time of the intervention. For Medico-Legal purposes, it is also important to point out that variation in procedural techniques and pharmacological choices are the acceptable norm. The indications, contraindications, technique, and results of the above procedure should only be interpreted and judged by a Board-Certified Interventional Pain Specialist with extensive familiarity and expertise in the same exact procedure and technique.

## 2017-07-14 NOTE — Patient Instructions (Signed)

## 2017-07-15 ENCOUNTER — Telehealth: Payer: Self-pay

## 2017-07-15 NOTE — Telephone Encounter (Signed)
Post procedure phone call.  LM 

## 2017-07-26 ENCOUNTER — Other Ambulatory Visit: Payer: Self-pay | Admitting: Pain Medicine

## 2017-07-26 NOTE — Progress Notes (Signed)
Patient's Name: Caleb [name]  MRN: [id number]  Referring Provider: Virginia Crews, MD  DOB: Nov 16, 1955  PCP: Virginia Crews, MD  DOS: 07/27/2017  Note by: Gaspar Cola, MD  Service setting: Ambulatory outpatient  Specialty: Interventional Pain Management  Location: [facility] (AMB) Pain Management Facility    Patient type: Established   Primary Reason(s) for Visit: Encounter for post-procedure evaluation of chronic illness with mild to moderate exacerbation CC: Neck Pain  HPI  [name] is a 62 y.o. year old, male patient, who comes today for a post-procedure evaluation. He has OSA (obstructive sleep apnea); Myocardial infarction ([place]); Hypertension; Hyperlipidemia; COPD (chronic obstructive pulmonary disease) ([place]); Chronic foot pain (Primary Area of Pain) (Right); Tobacco abuse; Erectile dysfunction; CAD (coronary artery disease), native coronary artery; Chronic ankle pain (Secondary Area of Pain) (Right); Chronic thoracic back pain [facility] Area of Pain) (Midline); Chronic pain syndrome; Long term current use of opiate analgesic; Pharmacologic therapy; Disorder of skeletal system; Problems influencing health status; Elevated C-reactive protein (CRP); Elevated sed rate; 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); Spondylosis without myelopathy or radiculopathy, cervicothoracic region; Chronic musculoskeletal pain; Neurogenic foot pain (Right); DDD (degenerative disc disease), cervical; 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 His primarily concern today is the Neck Pain  Pain  Assessment: Location: Left Neck Radiating: radiates down between shoulder blade down arm and to middle finger Onset: More than a month ago Duration: Chronic pain Quality: Burning, Tingling Severity: 1 /10 (subjective, self-reported pain score)  Note: Reported level is compatible with observation.                         When using our objective Pain Scale, levels between 6 and 10/10 are said to belong in an emergency room, as it progressively worsens from a 6/10, described as severely limiting, requiring emergency care not usually available at an outpatient pain management facility. At a 6/10 level, communication becomes difficult and requires great effort. Assistance to reach the emergency department may be required. Facial flushing and profuse sweating along with potentially dangerous increases in heart rate and blood pressure will be evident. Effect on ADL: " I cant turn head, cant hold head up" Timing: Constant Modifying factors: denies BP: (!) 108/91  HR: 71  [name] comes in today for post-procedure evaluation after the treatment done on 07/15/2017.  Further details on both, my assessment(s), as well as the proposed treatment plan, please see below.  Post-Procedure Assessment  07/14/2017 Procedure: Diagnostic/therapeutic left sided cervical epidural steroid injection #3 under fluoroscopic guidance and IV sedation Pre-procedure pain score:  2/10 Post-procedure pain score: 0/10 (100% relief) Influential Factors: BMI: 30.41 kg/m Intra-procedural challenges: None observed.         Assessment challenges: None detected.              Reported side-effects: None.        Post-procedural adverse reactions or complications: None reported         Sedation: Sedation provided. When no sedatives are used, the analgesic levels obtained are directly associated to the effectiveness of the local anesthetics. However, when sedation is provided, the level of analgesia obtained during the initial 1  hour following the intervention, is believed to be the result of a combination of factors. These factors may include, but are not limited to: 1. The effectiveness of the local anesthetics used. 2. The effects of the analgesic(s) and/or anxiolytic(s) used. 3. The degree of discomfort experienced by the patient at the time of the procedure. 4. The patients ability and reliability in recalling and recording the events. 5. The presence and influence of possible secondary gains and/or psychosocial factors. Reported result: Relief experienced during the 1st hour after the procedure: 100 % (Ultra-Short Term Relief)            Interpretative annotation: Clinically appropriate result. Analgesia during this period is likely to be Local Anesthetic and/or IV Sedative (Analgesic/Anxiolytic) related.          Effects of local anesthetic: The analgesic effects attained during this period are directly associated to the localized infiltration of local anesthetics and therefore cary significant diagnostic value as to the etiological location, or anatomical origin, of the pain. Expected duration of relief is directly dependent on the pharmacodynamics of the local anesthetic used. Long-acting (4-6 hours) anesthetics used.  Reported result: Relief during the next 4 to 6 hour after the procedure: 100 % (Short-Term Relief)            Interpretative annotation: Clinically appropriate result. Analgesia during this period is likely to be Local Anesthetic-related.          Long-term benefit: Defined as the period of time past the expected duration of local anesthetics (1 hour for short-acting and 4-6 hours for long-acting). With the possible exception of prolonged sympathetic blockade from the local anesthetics, benefits during this period are typically attributed to, or associated with, other factors such as analgesic sensory neuropraxia, antiinflammatory effects, or beneficial biochemical changes provided by agents other than  the local anesthetics.  Reported result: Extended relief following procedure: 0 % (Long-Term Relief)            Interpretative annotation: Unexpected result. Recurrence of symptoms. No long-term benefit attained. Persistent algesic mechanism detected.          Current benefits: Defined as reported results that persistent at this point in time.   Analgesia: 0 %            Function: Back to baseline ROM: Back to baseline Interpretative annotation: Recurrence of symptoms. No permanent benefit expected. Effective diagnostic intervention.          Interpretation: Results would suggest a successful diagnostic intervention.                  Plan:  Re-assessment of algesic etiology.                Laboratory Chemistry  Inflammation Markers (CRP: Acute Phase) (ESR: Chronic Phase) Lab Results  Component Value Date   CRP 7.3 (H) 02/17/2017   ESRSEDRATE 38 (H) 02/17/2017                         Renal Markers Lab Results  Component Value Date   BUN 18 05/17/2017   CREATININE 0.78 05/17/2017   BCR 22 (H) 02/17/2017   GFRAA >60 05/17/2017   GFRNONAA >60 05/17/2017                             Hepatic Markers Lab Results  Component Value Date   AST 19 02/17/2017   ALT 13 10/21/2016  ALBUMIN 4.4 02/17/2017                        Neuropathy Markers Lab Results  Component Value Date   HIV Non Reactive 02/04/2017                        Hematology Parameters Lab Results  Component Value Date   PLT 269 05/17/2017   HGB 16.8 05/17/2017   HCT 49.5 05/17/2017                        CV Markers Lab Results  Component Value Date   TROPONINI <0.03 05/17/2017                         Note: Lab results reviewed.  Recent Diagnostic Imaging Results  DG C-Arm 1-60 Min-No Report Fluoroscopy was utilized by the requesting physician.  No radiographic  interpretation.   Complexity Note: I personally reviewed the fluoroscopic imaging of the procedure.                        Meds    Current Outpatient Medications:  .  Adalimumab (HUMIRA PEN) 40 MG/0.4ML PNKT, Inject into the skin every 14 (fourteen) days., Disp: , Rfl:  .  albuterol (PROVENTIL HFA;VENTOLIN HFA) 108 (90 Base) MCG/ACT inhaler, Inhale 1-2 puffs every 6 (six) hours as needed into the lungs for wheezing or shortness of breath., Disp: 1 Inhaler, Rfl: 5 .  aspirin EC 81 MG tablet, Take 81 mg by mouth daily., Disp: , Rfl:  .  cyclobenzaprine (FLEXERIL) 10 MG tablet, Take 1 tablet (10 mg total) by mouth at bedtime., Disp: 30 tablet, Rfl: 2 .  fluticasone (FLOVENT HFA) 220 MCG/ACT inhaler, Inhale 1 puff 2 (two) times daily into the lungs., Disp: 1 Inhaler, Rfl: 5 .  gabapentin (NEURONTIN) 300 MG capsule, Take 3 capsules (900 mg total) by mouth 4 (four) times daily., Disp: 360 capsule, Rfl: 2 .  hydrochlorothiazide (HYDRODIURIL) 25 MG tablet, Take 1 tablet (25 mg total) by mouth daily., Disp: 90 tablet, Rfl: 3 .  isosorbide mononitrate (IMDUR) 30 MG 24 hr tablet, TAKE 0.5 TABLET(S) EVERY DAY BY ORAL ROUTE IN THE MORNING., Disp: 15 tablet, Rfl: 5 .  losartan (COZAAR) 50 MG tablet, Take 1 tablet (50 mg total) by mouth daily., Disp: 30 tablet, Rfl: 5 .  metoprolol tartrate (LOPRESSOR) 25 MG tablet, TAKE 1 TABLET BY MOUTH TWICE A DAY, Disp: 60 tablet, Rfl: 2 .  nitroGLYCERIN (NITROSTAT) 0.4 MG SL tablet, Place 1 tablet (0.4 mg total) under the tongue every 5 (five) minutes as needed for chest pain., Disp: 30 tablet, Rfl: 3 .  rosuvastatin (CRESTOR) 40 MG tablet, TAKE 1 TABLET BY MOUTH EVERY DAY, Disp: 90 tablet, Rfl: 3 .  tiotropium (SPIRIVA) 18 MCG inhalation capsule, Place 1 capsule (18 mcg total) daily into inhaler and inhale., Disp: 30 capsule, Rfl: 5 .  traMADol (ULTRAM) 50 MG tablet, Take 1 tablet (50 mg total) by mouth 2 (two) times daily., Disp: 60 tablet, Rfl: 2 .  triamcinolone ointment (KENALOG) 0.5 %, Apply 1 application 2 (two) times daily topically. On psoriasis lesions, Disp: 30 g, Rfl: 2  ROS   Constitutional: Denies any fever or chills Gastrointestinal: No reported hemesis, hematochezia, vomiting, or acute GI distress Musculoskeletal: Denies any acute onset joint swelling, redness, loss of ROM, or weakness  Neurological: No reported episodes of acute onset apraxia, aphasia, dysarthria, agnosia, amnesia, paralysis, loss of coordination, or loss of consciousness  Allergies  Mr. Kettles is allergic to lisinopril.  [place]  Drug: Mr. Tal  reports that he does not use drugs. Alcohol:  reports that he drinks about 2.4 oz of alcohol per week. Tobacco:  reports that he has been smoking cigarettes.  He has a 24.50 pack-year smoking history. He has never used smokeless tobacco. Medical:  has a past medical history of CHF (congestive heart failure) ([place]), Chronic foot pain, right (2015), COPD (chronic obstructive pulmonary disease) ([place]), Coronary artery disease, Hyperlipidemia, Hypertension, Myocardial infarction ([place]) (2015), and OSA on CPAP. Surgical: Mr. Eckerman  has a past surgical history that includes Cardiac catheterization (2015); Lumbar laminectomy (1989, 1999); Cervical fusion (1988, 1998); Liver surgery (2015); Partial colectomy (1990); Inguinal hernia repair (Bilateral, 1975); Lithotripsy; and Colonoscopy with propofol (N/A, 02/24/2017). Family: family history includes Alzheimer's disease in his paternal grandmother; Alzheimer's disease (age of onset: 54) in his father; Breast cancer in his maternal uncle; CAD in his mother; Diabetes in his maternal grandmother; Healthy in his sister; Heart attack in his maternal uncle; Heart failure in his maternal grandmother; Heart failure (age of onset: 102) in his mother; Non-Hodgkin's lymphoma in his sister.  Constitutional Exam  General appearance: Well nourished, well developed, and well hydrated. In no apparent acute distress Vitals:   07/27/17 1422  BP: (!) 108/91  Pulse: 71  Resp: 16  Temp: 97.8 F (36.6 C)  TempSrc: Oral  SpO2: 100%   Weight: 200 lb (90.7 kg)  Height: '5\' 8"'$  (1.727 m)   BMI Assessment: Estimated body mass index is 30.41 kg/m as calculated from the following:   Height as of this encounter: '5\' 8"'$  (1.727 m).   Weight as of this encounter: 200 lb (90.7 kg).  BMI interpretation table: BMI level Category Range association with higher incidence of chronic pain  <18 kg/m2 Underweight   18.5-24.9 kg/m2 Ideal body weight   25-29.9 kg/m2 Overweight Increased incidence by 20%  30-34.9 kg/m2 Obese (Class I) Increased incidence by 68%  35-39.9 kg/m2 Severe obesity (Class II) Increased incidence by 136%  >40 kg/m2 Extreme obesity (Class III) Increased incidence by 254%   Patient's current BMI Ideal Body weight  Body mass index is 30.41 kg/m. Ideal body weight: 68.4 kg (150 lb 12.7 oz) Adjusted ideal body weight: 77.3 kg (170 lb 7.6 oz)   BMI Readings from Last 4 Encounters:  07/27/17 30.41 kg/m  07/14/17 30.41 kg/m  06/30/17 30.41 kg/m  06/21/17 30.41 kg/m   Wt Readings from Last 4 Encounters:  07/27/17 200 lb (90.7 kg)  07/14/17 200 lb (90.7 kg)  06/30/17 200 lb (90.7 kg)  06/21/17 200 lb (90.7 kg)  Psych/Mental status: Alert, oriented x 3 (person, place, & time)       Eyes: PERLA Respiratory: No evidence of acute respiratory distress  Cervical Spine Area Exam  Skin & Axial Inspection: No masses, redness, edema, swelling, or associated skin lesions Alignment: Symmetrical Functional ROM: Decreased ROM      Stability: No instability detected Muscle Tone/Strength: Guarding observed Sensory (Neurological): Movement-associated pain Palpation: Complains of area being tender to palpation              Upper Extremity (UE) Exam    Side: Right upper extremity  Side: Left upper extremity  Skin & Extremity Inspection: Skin color, temperature, and hair growth are WNL. No peripheral edema or cyanosis. No masses, redness, swelling,  asymmetry, or associated skin lesions. No contractures.  Skin & Extremity  Inspection: Skin color, temperature, and hair growth are WNL. No peripheral edema or cyanosis. No masses, redness, swelling, asymmetry, or associated skin lesions. No contractures.  Functional ROM: Unrestricted ROM          Functional ROM: Unrestricted ROM          Muscle Tone/Strength: Functionally intact. No obvious neuro-muscular anomalies detected.  Muscle Tone/Strength: Functionally intact. No obvious neuro-muscular anomalies detected.  Sensory (Neurological): Unimpaired          Sensory (Neurological): Unimpaired          Palpation: No palpable anomalies              Palpation: No palpable anomalies              Provocative Test(s):  Phalen's test: deferred Tinel's test: deferred Apley's scratch test (touch opposite shoulder):  Action 1 (Across chest): deferred Action 2 (Overhead): deferred Action 3 (LB reach): deferred   Provocative Test(s):  Phalen's test: deferred Tinel's test: deferred Apley's scratch test (touch opposite shoulder):  Action 1 (Across chest): deferred Action 2 (Overhead): deferred Action 3 (LB reach): deferred    Thoracic Spine Area Exam  Skin & Axial Inspection: No masses, redness, or swelling Alignment: Symmetrical Functional ROM: Unrestricted ROM Stability: No instability detected Muscle Tone/Strength: Functionally intact. No obvious neuro-muscular anomalies detected. Sensory (Neurological): Unimpaired Muscle strength & Tone: No palpable anomalies  Lumbar Spine Area Exam  Skin & Axial Inspection: No masses, redness, or swelling Alignment: Symmetrical Functional ROM: Unrestricted ROM       Stability: No instability detected Muscle Tone/Strength: Functionally intact. No obvious neuro-muscular anomalies detected. Sensory (Neurological): Unimpaired Palpation: No palpable anomalies       Provocative Tests: Lumbar Hyperextension/rotation test: deferred today       Lumbar quadrant test (Kemp's test): deferred today       Lumbar Lateral bending test:  deferred today       Patrick's Maneuver: deferred today                   FABER test: deferred today                   Thigh-thrust test: deferred today       S-I compression test: deferred today       S-I distraction test: deferred today        Gait & Posture Assessment  Ambulation: Unassisted Gait: Relatively normal for age and body habitus Posture: WNL   Lower Extremity Exam    Side: Right lower extremity  Side: Left lower extremity  Stability: No instability observed          Stability: No instability observed          Skin & Extremity Inspection: Skin color, temperature, and hair growth are WNL. No peripheral edema or cyanosis. No masses, redness, swelling, asymmetry, or associated skin lesions. No contractures.  Skin & Extremity Inspection: Skin color, temperature, and hair growth are WNL. No peripheral edema or cyanosis. No masses, redness, swelling, asymmetry, or associated skin lesions. No contractures.  Functional ROM: Unrestricted ROM                  Functional ROM: Unrestricted ROM                  Muscle Tone/Strength: Functionally intact. No obvious neuro-muscular anomalies detected.  Muscle Tone/Strength: Functionally intact. No obvious neuro-muscular anomalies detected.  Sensory (Neurological): Unimpaired  Sensory (Neurological): Unimpaired  Palpation: No palpable anomalies  Palpation: No palpable anomalies   Assessment  Primary Diagnosis & Pertinent Problem List: The primary encounter diagnosis was Chronic foot pain (Primary Area of Pain) (Right). 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  Status Diagnosis  Controlled Controlled Persistent 1. Chronic foot pain (Primary Area of Pain) (Right)   2. Chronic ankle pain (Secondary Area of Pain) (Right)   3. Chronic thoracic back pain [facility] Area of Pain) (Midline)   4. Cervicalgia   5. Cervical disc disorder with radiculopathy of cervical region   6. 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 Cervical spondylosis with radiculopathy   8. Chronic cervical radiculopathy (Left)   9. Left arm weakness   10. Abnormal nerve conduction studies (06/08/2017)     Problems updated and reviewed during this visit: No problems updated. Plan of Care  Pharmacotherapy (Medications Ordered): No orders of the defined types were placed in this encounter.  Medications administered today: Sammy L. Pulliam had no medications administered during this visit.  Procedure Orders    No procedure(s) ordered today   Lab Orders  No laboratory test(s) ordered today    Imaging Orders     MR CERVICAL SPINE WO CONTRAST  Referral Orders     Ambulatory referral to Neurosurgery  Interventional management options: Planned, scheduled, and/or pending:   1. Repeat cervical MRI. The last one was done in 2002. He has electrodiagnostic evidence of cervical radiculopathy.    Considering:   Palliative/diagnostic right-sided Common Peroneal nerve block #3  Palliative right-sided common peroneal nerve RFA #2 (last done on 06/21/2017)  Diagnostic right ankle block Diagnosticright lumbar sympathetic block Diagnostic midlinethoracic epidural steroid injection Diagnostic bilateral thoracic facet block Possible bilateral thoracic facet RFA Diagnostic cervical epidural steroid injection Diagnostic bilateral cervical facet block Possible bilateral cervical facet RFA   Palliative PRN treatment(s):   Palliative left sided cervical epidural steroid injection #4  Palliative right-sided common  peroneal nerve blocks  Palliative right-sided common peroneal nerve RFA #2 (last done on 06/21/2017)    Provider-requested follow-up: Return for F/U eval (after test completion).  Future Appointments  Date Time Provider [place]  08/12/2017  5:00 PM [facility]  08/17/2017  8:30 AM Milinda Pointer, MD [facility] None  09/02/2017  8:20 AM Brita Romp Dionne Bucy, MD [facility] None  09/28/2017  9:30 AM Vevelyn Francois, NP [facility] None   Primary Care Physician: Virginia Crews, MD Location: [facility] Note by: Gaspar Cola, MD Date: 07/27/2017; Time: 3:33 PM

## 2017-07-27 ENCOUNTER — Ambulatory Visit: Payer: Medicaid Other | Attending: Pain Medicine | Admitting: Pain Medicine

## 2017-07-27 ENCOUNTER — Encounter: Payer: Self-pay | Admitting: Pain Medicine

## 2017-07-27 ENCOUNTER — Other Ambulatory Visit: Payer: Self-pay

## 2017-07-27 VITALS — BP 108/91 | HR 71 | Temp 97.8°F | Resp 16 | Ht 68.0 in | Wt 200.0 lb

## 2017-07-27 DIAGNOSIS — G8929 Other chronic pain: Secondary | ICD-10-CM

## 2017-07-27 DIAGNOSIS — M4802 Spinal stenosis, cervical region: Secondary | ICD-10-CM | POA: Diagnosis not present

## 2017-07-27 DIAGNOSIS — I10 Essential (primary) hypertension: Secondary | ICD-10-CM | POA: Diagnosis not present

## 2017-07-27 DIAGNOSIS — M79671 Pain in right foot: Secondary | ICD-10-CM | POA: Insufficient documentation

## 2017-07-27 DIAGNOSIS — M5010 Cervical disc disorder with radiculopathy, unspecified cervical region: Secondary | ICD-10-CM | POA: Diagnosis not present

## 2017-07-27 DIAGNOSIS — J449 Chronic obstructive pulmonary disease, unspecified: Secondary | ICD-10-CM | POA: Insufficient documentation

## 2017-07-27 DIAGNOSIS — F17210 Nicotine dependence, cigarettes, uncomplicated: Secondary | ICD-10-CM | POA: Diagnosis not present

## 2017-07-27 DIAGNOSIS — I2510 Atherosclerotic heart disease of native coronary artery without angina pectoris: Secondary | ICD-10-CM | POA: Diagnosis not present

## 2017-07-27 DIAGNOSIS — I252 Old myocardial infarction: Secondary | ICD-10-CM | POA: Diagnosis not present

## 2017-07-27 DIAGNOSIS — M961 Postlaminectomy syndrome, not elsewhere classified: Secondary | ICD-10-CM | POA: Diagnosis not present

## 2017-07-27 DIAGNOSIS — Z9889 Other specified postprocedural states: Secondary | ICD-10-CM | POA: Diagnosis not present

## 2017-07-27 DIAGNOSIS — M4722 Other spondylosis with radiculopathy, cervical region: Secondary | ICD-10-CM

## 2017-07-27 DIAGNOSIS — R531 Weakness: Secondary | ICD-10-CM | POA: Insufficient documentation

## 2017-07-27 DIAGNOSIS — R94130 Abnormal response to nerve stimulation, unspecified: Secondary | ICD-10-CM

## 2017-07-27 DIAGNOSIS — M5412 Radiculopathy, cervical region: Secondary | ICD-10-CM | POA: Diagnosis not present

## 2017-07-27 DIAGNOSIS — M25571 Pain in right ankle and joints of right foot: Secondary | ICD-10-CM | POA: Insufficient documentation

## 2017-07-27 DIAGNOSIS — M542 Cervicalgia: Secondary | ICD-10-CM

## 2017-07-27 DIAGNOSIS — M5033 Other cervical disc degeneration, cervicothoracic region: Secondary | ICD-10-CM | POA: Insufficient documentation

## 2017-07-27 DIAGNOSIS — G4733 Obstructive sleep apnea (adult) (pediatric): Secondary | ICD-10-CM | POA: Diagnosis not present

## 2017-07-27 DIAGNOSIS — M4803 Spinal stenosis, cervicothoracic region: Secondary | ICD-10-CM | POA: Diagnosis not present

## 2017-07-27 DIAGNOSIS — M47812 Spondylosis without myelopathy or radiculopathy, cervical region: Secondary | ICD-10-CM | POA: Diagnosis not present

## 2017-07-27 DIAGNOSIS — E785 Hyperlipidemia, unspecified: Secondary | ICD-10-CM | POA: Insufficient documentation

## 2017-07-27 DIAGNOSIS — M546 Pain in thoracic spine: Secondary | ICD-10-CM | POA: Insufficient documentation

## 2017-07-27 DIAGNOSIS — M9981 Other biomechanical lesions of cervical region: Secondary | ICD-10-CM

## 2017-07-27 DIAGNOSIS — R29898 Other symptoms and signs involving the musculoskeletal system: Secondary | ICD-10-CM | POA: Diagnosis not present

## 2017-07-27 NOTE — Progress Notes (Signed)
Safety precautions to be maintained throughout the outpatient stay will include: orient to surroundings, keep bed in low position, maintain call bell within reach at all times, provide assistance with transfer out of bed and ambulation.  

## 2017-07-29 ENCOUNTER — Telehealth: Payer: Self-pay | Admitting: *Deleted

## 2017-08-12 ENCOUNTER — Ambulatory Visit
Admission: RE | Admit: 2017-08-12 | Discharge: 2017-08-12 | Disposition: A | Discharge status: Home / Self Care | Payer: Medicaid Other | Source: Ambulatory Visit | Attending: Pain Medicine | Admitting: Pain Medicine

## 2017-08-12 DIAGNOSIS — M4802 Spinal stenosis, cervical region: Secondary | ICD-10-CM | POA: Diagnosis not present

## 2017-08-12 DIAGNOSIS — M9981 Other biomechanical lesions of cervical region: Secondary | ICD-10-CM | POA: Insufficient documentation

## 2017-08-12 DIAGNOSIS — M4803 Spinal stenosis, cervicothoracic region: Secondary | ICD-10-CM | POA: Insufficient documentation

## 2017-08-12 DIAGNOSIS — M4722 Other spondylosis with radiculopathy, cervical region: Secondary | ICD-10-CM | POA: Insufficient documentation

## 2017-08-12 DIAGNOSIS — R29898 Other symptoms and signs involving the musculoskeletal system: Secondary | ICD-10-CM | POA: Insufficient documentation

## 2017-08-12 DIAGNOSIS — M5010 Cervical disc disorder with radiculopathy, unspecified cervical region: Secondary | ICD-10-CM | POA: Diagnosis not present

## 2017-08-12 DIAGNOSIS — M542 Cervicalgia: Secondary | ICD-10-CM | POA: Diagnosis not present

## 2017-08-12 DIAGNOSIS — M5412 Radiculopathy, cervical region: Secondary | ICD-10-CM

## 2017-08-13 ENCOUNTER — Other Ambulatory Visit: Payer: Self-pay | Admitting: Family Medicine

## 2017-08-16 DIAGNOSIS — Z981 Arthrodesis status: Secondary | ICD-10-CM | POA: Diagnosis not present

## 2017-08-17 ENCOUNTER — Ambulatory Visit: Payer: Medicaid Other | Admitting: Pain Medicine

## 2017-08-29 ENCOUNTER — Telehealth: Payer: Self-pay | Admitting: Pain Medicine

## 2017-08-29 NOTE — Telephone Encounter (Signed)
error 

## 2017-09-02 ENCOUNTER — Encounter: Payer: Self-pay | Admitting: Family Medicine

## 2017-09-02 ENCOUNTER — Ambulatory Visit (INDEPENDENT_AMBULATORY_CARE_PROVIDER_SITE_OTHER): Payer: Medicaid Other | Admitting: Family Medicine

## 2017-09-02 VITALS — BP 148/96 | HR 73 | Temp 97.9°F | Wt 205.2 lb

## 2017-09-02 DIAGNOSIS — Z720 Tobacco use: Secondary | ICD-10-CM

## 2017-09-02 DIAGNOSIS — I10 Essential (primary) hypertension: Secondary | ICD-10-CM

## 2017-09-02 DIAGNOSIS — G4733 Obstructive sleep apnea (adult) (pediatric): Secondary | ICD-10-CM

## 2017-09-02 MED ORDER — LOSARTAN POTASSIUM 100 MG PO TABS: 100.0000 mg | ORAL_TABLET | Freq: Every day | ORAL | 5 refills | Status: DC

## 2017-09-02 NOTE — Assessment & Plan Note (Signed)
Chronic Patient has been able to cut back on the number of cigarettes he smokes per day He tried Chantix, but reached the insurance maximum of coverage for this We could consider Wellbutrin in the future He would like to keep trying to cut back independently at this time He knows the risks of continued smoking and the benefit of cessation

## 2017-09-02 NOTE — Progress Notes (Signed)
Patient: Caleb [name] Male    DOB: [birth year]   62 y.o.   MRN: [id number] Visit Date: 09/02/2017  Today's Provider: Lavon Paganini, MD   Chief Complaint  Patient presents with  . Hypertension   Subjective:    I, Caleb [name], CMA, am acting as a Education administrator for Lavon Paganini, MD.   HPI  Hypertension, follow-up:  BP Readings from Last 3 Encounters:  09/02/17 (!) 148/96  07/27/17 (!) 108/91  07/14/17 107/88    He was last seen for hypertension 3 months ago.  BP at that visit was 140/78. Management changes since that visit include increase Losartan to 50 mg, continue other medications and follow up in 3 months. He reports good compliance with treatment. He is not having side effects.  He is not exercising at this time. He is not adherent to low salt diet.   Outside blood pressures are only checked at home when he gets a headache.  BP was elevated at the dentist office as well. He is experiencing none.  Patient denies chest pain, chest pressure/discomfort, claudication, dyspnea, exertional chest pressure/discomfort, fatigue, irregular heart beat, lower extremity edema, near-syncope, orthopnea, palpitations, paroxysmal nocturnal dyspnea, syncope and tachypnea.   Cardiovascular risk factors include advanced age (older than 71 for men, 62 for women), dyslipidemia, male gender and obesity (BMI >= 30 kg/m2).  Use of agents associated with hypertension: none.     Weight trend: stable Wt Readings from Last 3 Encounters:  09/02/17 205 lb 3.2 oz (93.1 kg)  07/27/17 200 lb (90.7 kg)  07/14/17 200 lb (90.7 kg)   ------------------------------------------------------------------------ Smoking Cessation: Patient states he tried the Chantix, but insurance would not cover the medication for more than 4 months. He is still smoking at this time, but has weaned to 0.75 PPD from 2 PPD. He wishes to quit.  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  OSA: Has started using CPAP.  Using it nightly now that he has a better fitting mask.  He reports good compliance.  Now feels as though he has more energy and sleep is more restorative.    Allergies  Allergen Reactions  . Lisinopril Cough     Current Outpatient Medications:  .  albuterol (PROVENTIL HFA;VENTOLIN HFA) 108 (90 Base) MCG/ACT inhaler, Inhale 1-2 puffs every 6 (six) hours as needed into the lungs for wheezing or shortness of breath., Disp: 1 Inhaler, Rfl: 5 .  aspirin EC 81 MG tablet, Take 81 mg by mouth daily., Disp: , Rfl:  .  cyclobenzaprine (FLEXERIL) 10 MG tablet, Take 1 tablet (10 mg total) by mouth at bedtime., Disp: 30 tablet, Rfl: 2 .  fluticasone (FLOVENT HFA) 220 MCG/ACT inhaler, Inhale 1 puff 2 (two) times daily into the lungs., Disp: 1 Inhaler, Rfl: 5 .  gabapentin (NEURONTIN) 300 MG capsule, Take 3 capsules (900 mg total) by mouth 4 (four) times daily., Disp: 360 capsule, Rfl: 2 .  hydrochlorothiazide (HYDRODIURIL) 25 MG tablet, Take 1 tablet (25 mg total) by mouth daily., Disp: 90 tablet, Rfl: 3 .  isosorbide mononitrate (IMDUR) 30 MG 24 hr tablet, TAKE 0.5 TABLET(S) EVERY DAY BY ORAL ROUTE IN THE MORNING., Disp: 15 tablet, Rfl: 5 .  losartan (COZAAR) 50 MG tablet, Take 1 tablet (50 mg total) by mouth daily., Disp: 30 tablet, Rfl: 5 .  metoprolol tartrate (LOPRESSOR) 25 MG tablet, TAKE 1 TABLET BY MOUTH TWICE A DAY, Disp: 60 tablet,  Rfl: 2 .  nitroGLYCERIN (NITROSTAT) 0.4 MG SL tablet, Place 1 tablet (0.4 mg total) under the tongue every 5 (five) minutes as needed for chest pain., Disp: 30 tablet, Rfl: 3 .  rosuvastatin (CRESTOR) 40 MG tablet, TAKE 1 TABLET BY MOUTH EVERY DAY, Disp: 90 tablet, Rfl: 3 .  tiotropium (SPIRIVA) 18 MCG inhalation capsule, Place 1 capsule (18 mcg total) daily into inhaler and inhale., Disp: 30 capsule, Rfl: 5 .  traMADol (ULTRAM) 50 MG tablet, Take 1 tablet (50 mg total) by mouth 2 (two) times daily., Disp: 60 tablet, Rfl: 2 .   triamcinolone ointment (KENALOG) 0.5 %, Apply 1 application 2 (two) times daily topically. On psoriasis lesions, Disp: 30 g, Rfl: 2  Review of Systems  Constitutional: Negative.   Respiratory: Negative.   Cardiovascular: Negative.   Gastrointestinal: Negative.   Musculoskeletal: Positive for neck pain.    Social History   Tobacco Use  . Smoking status: Current Every Day Smoker    Packs/day: 0.50    Years: 49.00    Pack years: 24.50    Types: Cigarettes  . Smokeless tobacco: Never Used  . Tobacco comment: started smoking at age ; has decreased cigarette use from 2 PPD to 0.25 to 0.5 PPD  Substance Use Topics  . Alcohol use: Yes    Alcohol/week: 4.0 standard drinks    Types: 4 Cans of beer per week   Objective:   BP (!) 148/96 (BP Location: Right Arm, Patient Position: Sitting, Cuff Size: Large)   Pulse 73   Temp 97.9 F (36.6 C) (Oral)   Wt 205 lb 3.2 oz (93.1 kg)   SpO2 96%   BMI 31.20 kg/m  Vitals:   09/02/17 0829  BP: (!) 148/96  Pulse: 73  Temp: 97.9 F (36.6 C)  TempSrc: Oral  SpO2: 96%  Weight: 205 lb 3.2 oz (93.1 kg)     Physical Exam  Constitutional: He is oriented to person, place, and time. He appears well-developed and well-nourished. No distress.  HENT:  Head: Normocephalic and atraumatic.  Mouth/Throat: Oropharynx is clear and moist.  Eyes: Conjunctivae are normal. No scleral icterus.  Neck: Neck supple. No thyromegaly present.  Cardiovascular: Normal rate, regular rhythm, normal heart sounds and intact distal pulses.  No murmur heard. Pulmonary/Chest: Effort normal and breath sounds normal. No respiratory distress. He has no wheezes. He has no rales.  Musculoskeletal: He exhibits no edema.  Lymphadenopathy:    He has no cervical adenopathy.  Neurological: He is alert and oriented to person, place, and time.  Skin: Skin is warm and dry. Capillary refill takes less than 2 seconds. No rash noted.  Psychiatric: He has a normal mood and affect. His  behavior is normal.  Vitals reviewed.      Assessment & Plan:   Problem List Items Addressed This Visit      Cardiovascular and Mediastinum   Hypertension - Primary    Uncontrolled today and at recent appointments elsewhere Suspect that his headaches are related to his uncontrolled blood pressure Recent BMP reviewed Continue metoprolol and HCTZ at current doses Increase losartan to 100 mg daily Encourage DASH diet and regular exercise Follow-up in 3 months      Relevant Medications   losartan (COZAAR) 100 MG tablet     Respiratory   OSA (obstructive sleep apnea)    Patient has good compliance with CPAP and has good relief of symptoms        Other   Tobacco abuse  Chronic Patient has been able to cut back on the number of cigarettes he smokes per day He tried Chantix, but reached the insurance maximum of coverage for this We could consider Wellbutrin in the future He would like to keep trying to cut back independently at this time He knows the risks of continued smoking and the benefit of cessation          Return in about 3 months (around 12/03/2017) for CPE.   The entirety of the information documented in the History of Present Illness, Review of Systems and Physical Exam were personally obtained by me. Portions of this information were initially documented by Caleb [name], CMA and reviewed by me for thoroughness and accuracy.    Virginia Crews, MD, MPH [facility] 09/02/2017 10:18 AM

## 2017-09-02 NOTE — Assessment & Plan Note (Signed)
Uncontrolled today and at recent appointments elsewhere Suspect that his headaches are related to his uncontrolled blood pressure Recent BMP reviewed Continue metoprolol and HCTZ at current doses Increase losartan to 100 mg daily Encourage DASH diet and regular exercise Follow-up in 3 months

## 2017-09-02 NOTE — Patient Instructions (Signed)
DASH Eating Plan DASH stands for "Dietary Approaches to Stop Hypertension." The DASH eating plan is a healthy eating plan that has been shown to reduce high blood pressure (hypertension). It may also reduce your risk for type 2 diabetes, heart disease, and stroke. The DASH eating plan may also help with weight loss. What are tips for following this plan? General guidelines  Avoid eating more than 2,300 mg (milligrams) of salt (sodium) a day. If you have hypertension, you may need to reduce your sodium intake to 1,500 mg a day.  Limit alcohol intake to no more than 1 drink a day for nonpregnant women and 2 drinks a day for men. One drink equals 12 oz of beer, 5 oz of wine, or 1 oz of hard liquor.  Work with your health care provider to maintain a healthy body weight or to lose weight. Ask what an ideal weight is for you.  Get at least 30 minutes of exercise that causes your heart to beat faster (aerobic exercise) most days of the week. Activities may include walking, swimming, or biking.  Work with your health care provider or diet and nutrition specialist (dietitian) to adjust your eating plan to your individual calorie needs. Reading food labels  Check food labels for the amount of sodium per serving. Choose foods with less than 5 percent of the Daily Value of sodium. Generally, foods with less than 300 mg of sodium per serving fit into this eating plan.  To find whole grains, look for the word "whole" as the first word in the ingredient list. Shopping  Buy products labeled as "low-sodium" or "no salt added."  Buy fresh foods. Avoid canned foods and premade or frozen meals. Cooking  Avoid adding salt when cooking. Use salt-free seasonings or herbs instead of table salt or sea salt. Check with your health care provider or pharmacist before using salt substitutes.  Do not fry foods. Cook foods using healthy methods such as baking, boiling, grilling, and broiling instead.  Cook with  heart-healthy oils, such as olive, canola, soybean, or sunflower oil. Meal planning   Eat a balanced diet that includes: ? 5 or more servings of fruits and vegetables each day. At each meal, try to fill half of your plate with fruits and vegetables. ? Up to 6-8 servings of whole grains each day. ? Less than 6 oz of lean meat, poultry, or fish each day. A 3-oz serving of meat is about the same size as a deck of cards. One egg equals 1 oz. ? 2 servings of low-fat dairy each day. ? A serving of nuts, seeds, or beans 5 times each week. ? Heart-healthy fats. Healthy fats called Omega-3 fatty acids are found in foods such as flaxseeds and coldwater fish, like sardines, salmon, and mackerel.  Limit how much you eat of the following: ? Canned or prepackaged foods. ? Food that is high in trans fat, such as fried foods. ? Food that is high in saturated fat, such as fatty meat. ? Sweets, desserts, sugary drinks, and other foods with added sugar. ? Full-fat dairy products.  Do not salt foods before eating.  Try to eat at least 2 vegetarian meals each week.  Eat more home-cooked food and less restaurant, buffet, and fast food.  When eating at a restaurant, ask that your food be prepared with less salt or no salt, if possible. What foods are recommended? The items listed may not be a complete list. Talk with your dietitian about what   dietary choices are best for you. Grains Whole-grain or whole-wheat bread. Whole-grain or whole-wheat pasta. Brown rice. Oatmeal. Quinoa. Bulgur. Whole-grain and low-sodium cereals. Pita bread. Low-fat, low-sodium crackers. Whole-wheat flour tortillas. Vegetables Fresh or frozen vegetables (raw, steamed, roasted, or grilled). Low-sodium or reduced-sodium tomato and vegetable juice. Low-sodium or reduced-sodium tomato sauce and tomato paste. Low-sodium or reduced-sodium canned vegetables. Fruits All fresh, dried, or frozen fruit. Canned fruit in natural juice (without  added sugar). Meat and other protein foods Skinless chicken or turkey. Ground chicken or turkey. Pork with fat trimmed off. Fish and seafood. Egg whites. Dried beans, peas, or lentils. Unsalted nuts, nut butters, and seeds. Unsalted canned beans. Lean cuts of beef with fat trimmed off. Low-sodium, lean deli meat. Dairy Low-fat (1%) or fat-free (skim) milk. Fat-free, low-fat, or reduced-fat cheeses. Nonfat, low-sodium ricotta or cottage cheese. Low-fat or nonfat yogurt. Low-fat, low-sodium cheese. Fats and oils Soft margarine without trans fats. Vegetable oil. Low-fat, reduced-fat, or light mayonnaise and salad dressings (reduced-sodium). Canola, safflower, olive, soybean, and sunflower oils. Avocado. Seasoning and other foods Herbs. Spices. Seasoning mixes without salt. Unsalted popcorn and pretzels. Fat-free sweets. What foods are not recommended? The items listed may not be a complete list. Talk with your dietitian about what dietary choices are best for you. Grains Baked goods made with fat, such as croissants, muffins, or some breads. Dry pasta or rice meal packs. Vegetables Creamed or fried vegetables. Vegetables in a cheese sauce. Regular canned vegetables (not low-sodium or reduced-sodium). Regular canned tomato sauce and paste (not low-sodium or reduced-sodium). Regular tomato and vegetable juice (not low-sodium or reduced-sodium). Pickles. Olives. Fruits Canned fruit in a light or heavy syrup. Fried fruit. Fruit in cream or butter sauce. Meat and other protein foods Fatty cuts of meat. Ribs. Fried meat. Bacon. Sausage. Bologna and other processed lunch meats. Salami. Fatback. Hotdogs. Bratwurst. Salted nuts and seeds. Canned beans with added salt. Canned or smoked fish. Whole eggs or egg yolks. Chicken or turkey with skin. Dairy Whole or 2% milk, cream, and half-and-half. Whole or full-fat cream cheese. Whole-fat or sweetened yogurt. Full-fat cheese. Nondairy creamers. Whipped toppings.  Processed cheese and cheese spreads. Fats and oils Butter. Stick margarine. Lard. Shortening. Ghee. Bacon fat. Tropical oils, such as coconut, palm kernel, or palm oil. Seasoning and other foods Salted popcorn and pretzels. Onion salt, garlic salt, seasoned salt, table salt, and sea salt. Worcestershire sauce. Tartar sauce. Barbecue sauce. Teriyaki sauce. Soy sauce, including reduced-sodium. Steak sauce. Canned and packaged gravies. Fish sauce. Oyster sauce. Cocktail sauce. Horseradish that you find on the shelf. Ketchup. Mustard. Meat flavorings and tenderizers. Bouillon cubes. Hot sauce and Tabasco sauce. Premade or packaged marinades. Premade or packaged taco seasonings. Relishes. Regular salad dressings. Where to find more information:  National Heart, Lung, and Blood Institute: www.nhlbi.nih.gov  American Heart Association: www.heart.org Summary  The DASH eating plan is a healthy eating plan that has been shown to reduce high blood pressure (hypertension). It may also reduce your risk for type 2 diabetes, heart disease, and stroke.  With the DASH eating plan, you should limit salt (sodium) intake to 2,300 mg a day. If you have hypertension, you may need to reduce your sodium intake to 1,500 mg a day.  When on the DASH eating plan, aim to eat more fresh fruits and vegetables, whole grains, lean proteins, low-fat dairy, and heart-healthy fats.  Work with your health care provider or diet and nutrition specialist (dietitian) to adjust your eating plan to your individual   calorie needs. This information is not intended to replace advice given to you by your health care provider. Make sure you discuss any questions you have with your health care provider. Document Released: 12/10/2010 Document Revised: 12/15/2015 Document Reviewed: 12/15/2015 Elsevier Interactive Patient Education  2018 [employer]  

## 2017-09-02 NOTE — Assessment & Plan Note (Signed)
Patient has good compliance with CPAP and has good relief of symptoms

## 2017-09-06 NOTE — Progress Notes (Signed)
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 block Possible bilateral thoracic facet RFA Diagnostic cervical epidural steroid injection Diagnostic bilateral cervical facet block Possible bilateral cervical facet RFA   Palliative PRN treatment(s):   Palliative left sided cervical epidural steroid injection #4  Palliative right-sided common peroneal nerve blocks  Palliative right-sided common peroneal nerve RFA #2 (last done on 06/21/2017)    Provider-requested follow-up: Return for Procedure (w/ sedation): (B) C-FCT #1.  Future Appointments  Date Time Provider [place]  09/08/2017  9:45 AM Milinda Pointer, MD [facility] None  09/28/2017  9:30 AM Vevelyn Francois, NP [facility] None  12/14/2017 10:00 AM Bacigalupo, Dionne Bucy, MD [facility] None   Primary Care Physician: Caleb Crews, MD Location: [facility] Note by: Caleb Cola, MD Date: 09/07/2017; Time: 1:00 PM

## 2017-09-07 ENCOUNTER — Ambulatory Visit: Payer: Medicaid Other | Attending: Pain Medicine | Admitting: Pain Medicine

## 2017-09-07 ENCOUNTER — Other Ambulatory Visit: Payer: Self-pay

## 2017-09-07 ENCOUNTER — Encounter: Payer: Self-pay | Admitting: Pain Medicine

## 2017-09-07 VITALS — BP 132/82 | HR 75 | Temp 97.7°F | Resp 18 | Ht 68.0 in | Wt 205.0 lb

## 2017-09-07 DIAGNOSIS — F17210 Nicotine dependence, cigarettes, uncomplicated: Secondary | ICD-10-CM | POA: Diagnosis not present

## 2017-09-07 DIAGNOSIS — Z79899 Other long term (current) drug therapy: Secondary | ICD-10-CM | POA: Insufficient documentation

## 2017-09-07 DIAGNOSIS — M47812 Spondylosis without myelopathy or radiculopathy, cervical region: Secondary | ICD-10-CM | POA: Insufficient documentation

## 2017-09-07 DIAGNOSIS — Z79891 Long term (current) use of opiate analgesic: Secondary | ICD-10-CM | POA: Insufficient documentation

## 2017-09-07 DIAGNOSIS — G4733 Obstructive sleep apnea (adult) (pediatric): Secondary | ICD-10-CM | POA: Insufficient documentation

## 2017-09-07 DIAGNOSIS — M5134 Other intervertebral disc degeneration, thoracic region: Secondary | ICD-10-CM | POA: Diagnosis not present

## 2017-09-07 DIAGNOSIS — I219 Acute myocardial infarction, unspecified: Secondary | ICD-10-CM | POA: Diagnosis not present

## 2017-09-07 DIAGNOSIS — Z7982 Long term (current) use of aspirin: Secondary | ICD-10-CM | POA: Insufficient documentation

## 2017-09-07 DIAGNOSIS — I2510 Atherosclerotic heart disease of native coronary artery without angina pectoris: Secondary | ICD-10-CM | POA: Diagnosis not present

## 2017-09-07 DIAGNOSIS — M5030 Other cervical disc degeneration, unspecified cervical region: Secondary | ICD-10-CM | POA: Diagnosis not present

## 2017-09-07 DIAGNOSIS — E785 Hyperlipidemia, unspecified: Secondary | ICD-10-CM | POA: Diagnosis not present

## 2017-09-07 DIAGNOSIS — M19071 Primary osteoarthritis, right ankle and foot: Secondary | ICD-10-CM | POA: Diagnosis not present

## 2017-09-07 DIAGNOSIS — G894 Chronic pain syndrome: Secondary | ICD-10-CM | POA: Diagnosis not present

## 2017-09-07 DIAGNOSIS — M542 Cervicalgia: Secondary | ICD-10-CM | POA: Insufficient documentation

## 2017-09-07 DIAGNOSIS — I110 Hypertensive heart disease with heart failure: Secondary | ICD-10-CM | POA: Insufficient documentation

## 2017-09-07 DIAGNOSIS — M546 Pain in thoracic spine: Secondary | ICD-10-CM | POA: Diagnosis not present

## 2017-09-07 DIAGNOSIS — J449 Chronic obstructive pulmonary disease, unspecified: Secondary | ICD-10-CM | POA: Diagnosis not present

## 2017-09-07 DIAGNOSIS — M7918 Myalgia, other site: Secondary | ICD-10-CM | POA: Diagnosis not present

## 2017-09-07 DIAGNOSIS — Z888 Allergy status to other drugs, medicaments and biological substances status: Secondary | ICD-10-CM | POA: Diagnosis not present

## 2017-09-07 DIAGNOSIS — I252 Old myocardial infarction: Secondary | ICD-10-CM | POA: Insufficient documentation

## 2017-09-07 DIAGNOSIS — M79671 Pain in right foot: Secondary | ICD-10-CM | POA: Diagnosis present

## 2017-09-07 MED ORDER — TRAMADOL HCL 50 MG PO TABS: 50.0000 mg | ORAL_TABLET | Freq: Four times a day (QID) | ORAL | 2 refills | Status: DC | PRN

## 2017-09-07 NOTE — Progress Notes (Signed)
Safety precautions to be maintained throughout the outpatient stay will include: orient to surroundings, keep bed in low position, maintain call bell within reach at all times, provide assistance with transfer out of bed and ambulation.  

## 2017-09-07 NOTE — Patient Instructions (Addendum)
__You have been given a script for Tramadol today.  You have been given pre procedure ionstructions with sedation__________________________________________________________________________________________  Preparing for Procedure with Sedation  Instructions: . Oral Intake: Do not eat or drink anything for at least 8 hours prior to your procedure. . Transportation: Public transportation is not allowed. Bring an adult driver. The driver must be physically present in our waiting room before any procedure can be started. Marland Kitchen Physical Assistance: Bring an adult physically capable of assisting you, in the event you need help. This adult should keep you company at home for at least 6 hours after the procedure. . Blood Pressure Medicine: Take your blood pressure medicine with a sip of water the morning of the procedure. . Blood thinners: Notify our staff if you are taking any blood thinners. Depending on which one you take, there will be specific instructions on how and when to stop it. . Diabetics on insulin: Notify the staff so that you can be scheduled 1st case in the morning. If your diabetes requires high dose insulin, take only  of your normal insulin dose the morning of the procedure and notify the staff that you have done so. . Preventing infections: Shower with an antibacterial soap the morning of your procedure. . Build-up your immune system: Take 1000 mg of Vitamin C with every meal (3 times a day) the day prior to your procedure. Marland Kitchen Antibiotics: Inform the staff if you have a condition or reason that requires you to take antibiotics before dental procedures. . Pregnancy: If you are pregnant, call and cancel the procedure. . Sickness: If you have a cold, fever, or any active infections, call and cancel the procedure. . Arrival: You must be in the facility at least 30 minutes prior to your scheduled procedure. . Children: Do not bring children with you. . Dress appropriately: Bring dark clothing  that you would not mind if they get stained. . Valuables: Do not bring any jewelry or valuables.  Procedure appointments are reserved for interventional treatments only. Marland Kitchen No Prescription Refills. . No medication changes will be discussed during procedure appointments. . No disability issues will be discussed.  Reasons to call and reschedule or cancel your procedure: (Following these recommendations will minimize the risk of a serious complication.) . Surgeries: Avoid having procedures within 2 weeks of any surgery. (Avoid for 2 weeks before or after any surgery). . Flu Shots: Avoid having procedures within 2 weeks of a flu shots or . (Avoid for 2 weeks before or after immunizations). . Barium: Avoid having a procedure within 7-10 days after having had a radiological study involving the use of radiological contrast. (Myelograms, Barium swallow or enema study). . Heart attacks: Avoid any elective procedures or surgeries for the initial 6 months after a "Myocardial Infarction" (Heart Attack). . Blood thinners: It is imperative that you stop these medications before procedures. Let us know if you if you take any blood thinner.  . Infection: Avoid procedures during or within two weeks of an infection (including chest colds or gastrointestinal problems). Symptoms associated with infections include: Localized redness, fever, chills, night sweats or profuse sweating, burning sensation when voiding, cough, congestion, stuffiness, runny nose, sore throat, diarrhea, nausea, vomiting, cold or Flu symptoms, recent or current infections. It is specially important if the infection is over the area that we intend to treat. Marland Kitchen Heart and lung problems: Symptoms that may suggest an active cardiopulmonary problem include: cough, chest pain, breathing difficulties or shortness of breath, dizziness, ankle swelling,  uncontrolled high or unusually low blood pressure, and/or palpitations. If you are experiencing any of these  symptoms, cancel your procedure and contact your primary care physician for an evaluation.  Remember:  Regular Business hours are:  Monday to Thursday 8:00 AM to 4:00 PM  Provider's Schedule: Milinda Pointer, MD:  Procedure days: Tuesday and Thursday 7:30 AM to 4:00 PM  Gillis Santa, MD:  Procedure days: Monday and Wednesday 7:30 AM to 4:00 PM ____________________________________________________________________________________________   Preparing for Procedure with Sedation Instructions: . Oral Intake: Do not eat or drink anything for at least 8 hours prior to your procedure. . Transportation: Public transportation is not allowed. Bring an adult driver. The driver must be physically present in our waiting room before any procedure can be started. Marland Kitchen Physical Assistance: Bring an adult capable of physically assisting you, in the event you need help. . Blood Pressure Medicine: Take your blood pressure medicine with a sip of water the morning of the procedure. . Insulin: Take only  of your normal insulin dose. . Preventing infections: Shower with an antibacterial soap the morning of your procedure. . Build-up your immune system: Take 1000 mg of Vitamin C with every meal (3 times a day) the day prior to your procedure. . Pregnancy: If you are pregnant, call and cancel the procedure. . Sickness: If you have a cold, fever, or any active infections, call and cancel the procedure. . Arrival: You must be in the facility at least 30 minutes prior to your scheduled procedure. . Children: Do not bring children with you. . Dress appropriately: Bring dark clothing that you would not mind if they get stained. . Valuables: Do not bring any jewelry or valuables. Procedure appointments are reserved for interventional treatments only. Marland Kitchen No Prescription Refills. . No medication changes will be discussed during procedure appointments. . No disability issues will be discussed.

## 2017-09-08 ENCOUNTER — Other Ambulatory Visit: Payer: Self-pay

## 2017-09-08 ENCOUNTER — Encounter: Payer: Self-pay | Admitting: Pain Medicine

## 2017-09-08 ENCOUNTER — Ambulatory Visit: Payer: Medicaid Other | Admitting: Pain Medicine

## 2017-09-08 ENCOUNTER — Ambulatory Visit
Admission: RE | Admit: 2017-09-08 | Discharge: 2017-09-08 | Disposition: A | Discharge status: Home / Self Care | Payer: Medicaid Other | Source: Ambulatory Visit | Attending: Pain Medicine | Admitting: Pain Medicine

## 2017-09-08 VITALS — BP 115/85 | HR 69 | Temp 97.4°F | Resp 20 | Ht 68.0 in | Wt 205.0 lb

## 2017-09-08 DIAGNOSIS — M4722 Other spondylosis with radiculopathy, cervical region: Secondary | ICD-10-CM

## 2017-09-08 DIAGNOSIS — Z7982 Long term (current) use of aspirin: Secondary | ICD-10-CM | POA: Insufficient documentation

## 2017-09-08 DIAGNOSIS — Z9049 Acquired absence of other specified parts of digestive tract: Secondary | ICD-10-CM | POA: Diagnosis not present

## 2017-09-08 DIAGNOSIS — M79671 Pain in right foot: Secondary | ICD-10-CM | POA: Insufficient documentation

## 2017-09-08 DIAGNOSIS — K4020 Bilateral inguinal hernia, without obstruction or gangrene, not specified as recurrent: Secondary | ICD-10-CM | POA: Insufficient documentation

## 2017-09-08 DIAGNOSIS — M5030 Other cervical disc degeneration, unspecified cervical region: Secondary | ICD-10-CM | POA: Insufficient documentation

## 2017-09-08 DIAGNOSIS — Z79899 Other long term (current) drug therapy: Secondary | ICD-10-CM | POA: Insufficient documentation

## 2017-09-08 DIAGNOSIS — M542 Cervicalgia: Secondary | ICD-10-CM | POA: Insufficient documentation

## 2017-09-08 DIAGNOSIS — M47812 Spondylosis without myelopathy or radiculopathy, cervical region: Secondary | ICD-10-CM | POA: Insufficient documentation

## 2017-09-08 DIAGNOSIS — G8929 Other chronic pain: Secondary | ICD-10-CM

## 2017-09-08 DIAGNOSIS — R94130 Abnormal response to nerve stimulation, unspecified: Secondary | ICD-10-CM

## 2017-09-08 DIAGNOSIS — R937 Abnormal findings on diagnostic imaging of other parts of musculoskeletal system: Secondary | ICD-10-CM | POA: Insufficient documentation

## 2017-09-08 DIAGNOSIS — Z981 Arthrodesis status: Secondary | ICD-10-CM | POA: Diagnosis not present

## 2017-09-08 DIAGNOSIS — M4802 Spinal stenosis, cervical region: Secondary | ICD-10-CM

## 2017-09-08 DIAGNOSIS — M792 Neuralgia and neuritis, unspecified: Secondary | ICD-10-CM

## 2017-09-08 DIAGNOSIS — M5412 Radiculopathy, cervical region: Secondary | ICD-10-CM

## 2017-09-08 DIAGNOSIS — M5010 Cervical disc disorder with radiculopathy, unspecified cervical region: Secondary | ICD-10-CM | POA: Insufficient documentation

## 2017-09-08 DIAGNOSIS — M25571 Pain in right ankle and joints of right foot: Secondary | ICD-10-CM

## 2017-09-08 MED ORDER — MIDAZOLAM HCL 5 MG/5ML IJ SOLN
1.0000 mg | INTRAMUSCULAR | Status: DC | PRN
  Administered 2017-09-08: 3 mg via INTRAVENOUS
  Filled 2017-09-08: qty 5

## 2017-09-08 MED ORDER — FENTANYL CITRATE (PF) 100 MCG/2ML IJ SOLN
25.0000 ug | INTRAMUSCULAR | Status: DC | PRN
  Administered 2017-09-08: 100 ug via INTRAVENOUS
  Filled 2017-09-08: qty 2

## 2017-09-08 MED ORDER — DEXAMETHASONE SODIUM PHOSPHATE 10 MG/ML IJ SOLN
20.0000 mg | Freq: Once | INTRAMUSCULAR | Status: AC
  Administered 2017-09-08: 20 mg
  Filled 2017-09-08: qty 2

## 2017-09-08 MED ORDER — LACTATED RINGERS IV SOLN
1000.0000 mL | Freq: Once | INTRAVENOUS | Status: AC
  Administered 2017-09-08: 1000 mL via INTRAVENOUS

## 2017-09-08 MED ORDER — LIDOCAINE HCL 2 % IJ SOLN
20.0000 mL | Freq: Once | INTRAMUSCULAR | Status: AC
  Administered 2017-09-08: 400 mg
  Filled 2017-09-08: qty 40

## 2017-09-08 MED ORDER — ROPIVACAINE HCL 2 MG/ML IJ SOLN
18.0000 mL | Freq: Once | INTRAMUSCULAR | Status: AC
  Administered 2017-09-08: 18 mL via PERINEURAL
  Filled 2017-09-08: qty 20

## 2017-09-08 NOTE — Patient Instructions (Signed)

## 2017-09-08 NOTE — Progress Notes (Signed)
Patient's Name: Caleb [name]  MRN: [id number]  Referring Provider: Virginia Crews, MD  DOB: 1955-03-17  PCP: Virginia Crews, MD  DOS: 09/08/2017  Note by: Gaspar Cola, MD  Service setting: Ambulatory outpatient  Specialty: Interventional Pain Management  Patient type: Established  Location: [facility] (AMB) Pain Management Facility  Visit type: Interventional Procedure   Primary Reason for Visit: Interventional Pain Management Treatment. CC: Neck Pain and Foot Pain (right)  Procedure:          Anesthesia, Analgesia, Anxiolysis:  Type: Cervical Facet Medial Branch Block(s) #1  Primary Purpose: Diagnostic Region: Posterolateral cervical spine Level: C3, C4, C5, C6, & C7 Medial Branch Level(s). Injecting these levels blocks the C3-4, C4-5, C5-6, and C6-7 cervical facet joints. Laterality: Bilateral  Type: Moderate (Conscious) Sedation combined with Local Anesthesia Indication(s): Analgesia and Anxiety Route: Intravenous (IV) IV Access: Secured Sedation: Meaningful verbal contact was maintained at all times during the procedure  Local Anesthetic: Lidocaine 1-2%   Indications: 1. Cervical facet joint syndrome   2. Cervicalgia   3. DDD (degenerative disc disease), cervical   4. Cervical spondylosis w/ radiculopathy    Pain Score: Pre-procedure: 4 /10 Post-procedure: 0-No pain/10  Pre-op Assessment:  [name] is a 62 y.o. (year old), male patient, seen today for interventional treatment. He  has a past surgical history that includes Cardiac catheterization (2015); Lumbar laminectomy (1989, 1999); Cervical fusion (1988, 1998); Liver surgery (2015); Partial colectomy (1990); Inguinal hernia repair (Bilateral, 1975); Lithotripsy; and Colonoscopy with propofol (N/A, 02/24/2017). Mr. Mimbs has a current medication list which includes the following prescription(s): albuterol, aspirin ec, cyclobenzaprine, fluticasone, gabapentin, hydrochlorothiazide, isosorbide mononitrate,  losartan, metoprolol tartrate, nitroglycerin, rosuvastatin, secukinumab, tiotropium, tramadol, and triamcinolone ointment, and the following Facility-Administered Medications: fentanyl and midazolam. His primarily concern today is the Neck Pain and Foot Pain (right)  Initial Vital Signs:  Pulse/HCG Rate: 66ECG Heart Rate: 66 Temp: 97.8 F (36.6 C) Resp: 16 BP: 115/83 SpO2: 99 %  BMI: Estimated body mass index is 31.17 kg/m as calculated from the following:   Height as of this encounter: 5\' 8"  (1.727 m).   Weight as of this encounter: 205 lb (93 kg).  Risk Assessment: Allergies: Reviewed. He is allergic to lisinopril.  Allergy Precautions: None required Coagulopathies: Reviewed. None identified.  Blood-thinner therapy: None at this time Active Infection(s): Reviewed. None identified. [name] is afebrile  Site Confirmation: [name] was asked to confirm the procedure and laterality before marking the site Procedure checklist: Completed Consent: Before the procedure and under the influence of no sedative(s), amnesic(s), or anxiolytics, the patient was informed of the treatment options, risks and possible complications. To fulfill our ethical and legal obligations, as recommended by the American Medical Association's Code of Ethics, I have informed the patient of my clinical impression; the nature and purpose of the treatment or procedure; the risks, benefits, and possible complications of the intervention; the alternatives, including doing nothing; the risk(s) and benefit(s) of the alternative treatment(s) or procedure(s); and the risk(s) and benefit(s) of doing nothing. The patient was provided information about the general risks and possible complications associated with the procedure. These may include, but are not limited to: failure to achieve desired goals, infection, bleeding, organ or nerve damage, allergic reactions, paralysis, and death. In addition, the patient was informed of  those risks and complications associated to Spine-related procedures, such as failure to decrease pain; infection (i.e.: Meningitis, epidural or intraspinal abscess); bleeding (i.e.: epidural hematoma, subarachnoid hemorrhage, or any  other type of intraspinal or peri-dural bleeding); organ or nerve damage (i.e.: Any type of peripheral nerve, nerve root, or spinal cord injury) with subsequent damage to sensory, motor, and/or autonomic systems, resulting in permanent pain, numbness, and/or weakness of one or several areas of the body; allergic reactions; (i.e.: anaphylactic reaction); and/or death. Furthermore, the patient was informed of those risks and complications associated with the medications. These include, but are not limited to: allergic reactions (i.e.: anaphylactic or anaphylactoid reaction(s)); adrenal axis suppression; blood sugar elevation that in diabetics may result in ketoacidosis or comma; water retention that in patients with history of congestive heart failure may result in shortness of breath, pulmonary edema, and decompensation with resultant heart failure; weight gain; swelling or edema; medication-induced neural toxicity; particulate matter embolism and blood vessel occlusion with resultant organ, and/or nervous system infarction; and/or aseptic necrosis of one or more joints. Finally, the patient was informed that Medicine is not an exact science; therefore, there is also the possibility of unforeseen or unpredictable risks and/or possible complications that may result in a catastrophic outcome. The patient indicated having understood very clearly. We have given the patient no guarantees and we have made no promises. Enough time was given to the patient to ask questions, all of which were answered to the patient's satisfaction. [name] has indicated that he wanted to continue with the procedure. Attestation: I, the ordering provider, attest that I have discussed with the patient the  benefits, risks, side-effects, alternatives, likelihood of achieving goals, and potential problems during recovery for the procedure that I have provided informed consent. Date  Time: 09/08/2017  9:40 AM  Pre-Procedure Preparation:  Monitoring: As per clinic protocol. Respiration, ETCO2, SpO2, BP, heart rate and rhythm monitor placed and checked for adequate function Safety Precautions: Patient was assessed for positional comfort and pressure points before starting the procedure. Time-out: I initiated and conducted the "Time-out" before starting the procedure, as per protocol. The patient was asked to participate by confirming the accuracy of the "Time Out" information. Verification of the correct person, site, and procedure were performed and confirmed by me, the nursing staff, and the patient. "Time-out" conducted as per Joint Commission's Universal Protocol (UP.01.01.01). Time: 1038  Description of Procedure:          Position: Prone with head of the table raised to facilitate breathing. Laterality: Bilateral. The procedure was performed in identical fashion on both sides. Level: C3, C4, C5, C6, & C7 Medial Branch Level(s). Area Prepped: Posterior Cervico-thoracic Region Prepping solution: ChloraPrep (2% chlorhexidine gluconate and 70% isopropyl alcohol) Safety Precautions: Aspiration looking for blood return was conducted prior to all injections. At no point did we inject any substances, as a needle was being advanced. Before injecting, the patient was told to immediately notify me if he was experiencing any new onset of "ringing in the ears, or metallic taste in the mouth". No attempts were made at seeking any paresthesias. Safe injection practices and needle disposal techniques used. Medications properly checked for expiration dates. SDV (single dose vial) medications used. After the completion of the procedure, all disposable equipment used was discarded in the proper designated medical waste  containers. Local Anesthesia: Protocol guidelines were followed. The patient was positioned over the fluoroscopy table. The area was prepped in the usual manner. The time-out was completed. The target area was identified using fluoroscopy. A 12-in long, straight, sterile hemostat was used with fluoroscopic guidance to locate the targets for each level blocked. Once located, the skin was  marked with an approved surgical skin marker. Once all sites were marked, the skin (epidermis, dermis, and hypodermis), as well as deeper tissues (fat, connective tissue and muscle) were infiltrated with a small amount of a short-acting local anesthetic, loaded on a 10cc syringe with a 25G, 1.5-in  Needle. An appropriate amount of time was allowed for local anesthetics to take effect before proceeding to the next step. Local Anesthetic: Lidocaine 2.0% The unused portion of the local anesthetic was discarded in the proper designated containers. Technical explanation of process:  C3 Medial Branch Nerve Block (MBB): The target area for the C3 dorsal medial articular branch is the lateral concave waist of the articular pillar of C3. Under fluoroscopic guidance, a Quincke needle was inserted until contact was made with os over the postero-lateral aspect of the articular pillar of C3 (target area). After negative aspiration for blood, 0.5 mL of the nerve block solution was injected without difficulty or complication. The needle was removed intact. C4 Medial Branch Nerve Block (MBB): The target area for the C4 dorsal medial articular branch is the lateral concave waist of the articular pillar of C4. Under fluoroscopic guidance, a Quincke needle was inserted until contact was made with os over the postero-lateral aspect of the articular pillar of C4 (target area). After negative aspiration for blood, 0.5 mL of the nerve block solution was injected without difficulty or complication. The needle was removed intact. C5 Medial Branch Nerve  Block (MBB): The target area for the C5 dorsal medial articular branch is the lateral concave waist of the articular pillar of C5. Under fluoroscopic guidance, a Quincke needle was inserted until contact was made with os over the postero-lateral aspect of the articular pillar of C5 (target area). After negative aspiration for blood, 0.5 mL of the nerve block solution was injected without difficulty or complication. The needle was removed intact. C6 Medial Branch Nerve Block (MBB): The target area for the C6 dorsal medial articular branch is the lateral concave waist of the articular pillar of C6. Under fluoroscopic guidance, a Quincke needle was inserted until contact was made with os over the postero-lateral aspect of the articular pillar of C6 (target area). After negative aspiration for blood, 0.5 mL of the nerve block solution was injected without difficulty or complication. The needle was removed intact. C7 Medial Branch Nerve Block (MBB): The target for the C7 dorsal medial articular branch lies on the superior-medial tip of the C7 transverse process. Under fluoroscopic guidance, a Quincke needle was inserted until contact was made with os over the postero-lateral aspect of the articular pillar of C7 (target area). After negative aspiration for blood, 0.5 mL of the nerve block solution was injected without difficulty or complication. The needle was removed intact. Procedural Needles: 22-gauge, 3.5-inch, Quincke needles used for all levels. Nerve block solution: 0.2% PF-Ropivacaine + Triamcinolone (40 mg/mL) diluted to a final concentration of 4 mg of Triamcinolone/mL of Ropivacaine The unused portion of the solution was discarded in the proper designated containers.  Once the entire procedure was completed, the treated area was cleaned, making sure to leave some of the prepping solution back to take advantage of its long term bactericidal properties.  Vitals:   09/08/17 1055 09/08/17 1105 09/08/17  1115 09/08/17 1125  BP: 128/90 99/75 105/80 115/85  Pulse: 69     Resp: 11 14 17 20   Temp:  97.6 F (36.4 C)  (!) 97.4 F (36.3 C)  SpO2: 95% 97% 98% 98%  Weight:  Height:        Start Time: 1038 hrs. End Time: 1052 hrs.  Imaging Guidance (Spinal):          Type of Imaging Technique: Fluoroscopy Guidance (Spinal) Indication(s): Assistance in needle guidance and placement for procedures requiring needle placement in or near specific anatomical locations not easily accessible without such assistance. Exposure Time: Please see nurses notes. Contrast: None used. Fluoroscopic Guidance: I was personally present during the use of fluoroscopy. "Tunnel Vision Technique" used to obtain the best possible view of the target area. Parallax error corrected before commencing the procedure. "Direction-depth-direction" technique used to introduce the needle under continuous pulsed fluoroscopy. Once target was reached, antero-posterior, oblique, and lateral fluoroscopic projection used confirm needle placement in all planes. Images permanently stored in EMR. Interpretation: No contrast injected. I personally interpreted the imaging intraoperatively. Adequate needle placement confirmed in multiple planes. Permanent images saved into the patient's record.  Antibiotic Prophylaxis:   Anti-infectives (From admission, onward)   None     Indication(s): None identified  Post-operative Assessment:  Post-procedure Vital Signs:  Pulse/HCG Rate: 6966 Temp: (!) 97.4 F (36.3 C) Resp: 20 BP: 115/85 SpO2: 98 %  EBL: None  Complications: No immediate post-treatment complications observed by team, or reported by patient.  Note: The patient tolerated the entire procedure well. A repeat set of vitals were taken after the procedure and the patient was kept under observation following institutional policy, for this type of procedure. Post-procedural neurological assessment was performed, showing return to  baseline, prior to discharge. The patient was provided with post-procedure discharge instructions, including a section on how to identify potential problems. Should any problems arise concerning this procedure, the patient was given instructions to immediately contact us, at any time, without hesitation. In any case, we plan to contact the patient by telephone for a follow-up status report regarding this interventional procedure.  Comments:  No additional relevant information.  Plan of Care    Imaging Orders     DG C-Arm 1-60 Min-No Report  Procedure Orders     CERVICAL FACET (MEDIAL BRANCH NERVE BLOCK)      NERVE BLOCK  Medications ordered for procedure: Meds ordered this encounter  Medications  . lidocaine (XYLOCAINE) 2 % (with pres) injection 400 mg  . midazolam (VERSED) 5 MG/5ML injection 1-2 mg    Make sure Flumazenil is available in the pyxis when using this medication. If oversedation occurs, administer 0.2 mg IV over 15 sec. If after 45 sec no response, administer 0.2 mg again over 1 min; may repeat at 1 min intervals; not to exceed 4 doses (1 mg)  . fentaNYL (SUBLIMAZE) injection 25-50 mcg    Make sure Narcan is available in the pyxis when using this medication. In the event of respiratory depression (RR< 8/min): Titrate NARCAN (naloxone) in increments of 0.1 to 0.2 mg IV at 2-3 minute intervals, until desired degree of reversal.  . lactated ringers infusion 1,000 mL  . ropivacaine (PF) 2 mg/mL (0.2%) (NAROPIN) injection 18 mL  . dexamethasone (DECADRON) injection 20 mg   Medications administered: We administered lidocaine, midazolam, fentaNYL, lactated ringers, ropivacaine (PF) 2 mg/mL (0.2%), and dexamethasone.  See the medical record for exact dosing, route, and time of administration.  New Prescriptions   No medications on file   Disposition: Discharge home  Discharge Date & Time: 09/08/2017; 1129 hrs.   Physician-requested Follow-up: Return for PPE (2 wks) + Procedure  (w/ sedation): (R) Superficial Peroneal N. + Saphenous NB.  Future  Appointments  Date Time Provider [place]  09/19/2017  9:30 AM Vevelyn Francois, NP [facility] None  10/04/2017  9:15 AM Milinda Pointer, MD [facility] None  12/14/2017 10:00 AM Bacigalupo, Dionne Bucy, MD [facility] None   Primary Care Physician: Virginia Crews, MD Location: [facility] Note by: Gaspar Cola, MD Date: 09/08/2017; Time: 11:58 AM  Disclaimer:  Medicine is not an exact science. The only guarantee in medicine is that nothing is guaranteed. It is important to note that the decision to proceed with this intervention was based on the information collected from the patient. The Data and conclusions were drawn from the patient's questionnaire, the interview, and the physical examination. Because the information was provided in large part by the patient, it cannot be guaranteed that it has not been purposely or unconsciously manipulated. Every effort has been made to obtain as much relevant data as possible for this evaluation. It is important to note that the conclusions that lead to this procedure are derived in large part from the available data. Always take into account that the treatment will also be dependent on availability of resources and existing treatment guidelines, considered by other Pain Management Practitioners as being common knowledge and practice, at the time of the intervention. For Medico-Legal purposes, it is also important to point out that variation in procedural techniques and pharmacological choices are the acceptable norm. The indications, contraindications, technique, and results of the above procedure should only be interpreted and judged by a Board-Certified Interventional Pain Specialist with extensive familiarity and expertise in the same exact procedure and technique.

## 2017-09-08 NOTE — Progress Notes (Signed)
Safety precautions to be maintained throughout the outpatient stay will include: orient to surroundings, keep bed in low position, maintain call bell within reach at all times, provide assistance with transfer out of bed and ambulation.  

## 2017-09-09 ENCOUNTER — Telehealth: Payer: Self-pay | Admitting: *Deleted

## 2017-09-09 DIAGNOSIS — L400 Psoriasis vulgaris: Secondary | ICD-10-CM | POA: Diagnosis not present

## 2017-09-09 NOTE — Telephone Encounter (Signed)
Attempted to call for post procedure follow-up. Message left. 

## 2017-09-12 ENCOUNTER — Other Ambulatory Visit: Payer: Self-pay | Admitting: Family Medicine

## 2017-09-19 ENCOUNTER — Encounter: Payer: Self-pay | Admitting: Nurse Practitioner

## 2017-09-19 ENCOUNTER — Ambulatory Visit: Payer: Medicaid Other | Attending: Nurse Practitioner | Admitting: Nurse Practitioner

## 2017-09-19 VITALS — BP 108/73 | HR 62 | Temp 97.7°F | Resp 16 | Ht 68.0 in | Wt 200.0 lb

## 2017-09-19 DIAGNOSIS — I252 Old myocardial infarction: Secondary | ICD-10-CM | POA: Diagnosis not present

## 2017-09-19 DIAGNOSIS — G8929 Other chronic pain: Secondary | ICD-10-CM

## 2017-09-19 DIAGNOSIS — M7918 Myalgia, other site: Secondary | ICD-10-CM | POA: Diagnosis not present

## 2017-09-19 DIAGNOSIS — J449 Chronic obstructive pulmonary disease, unspecified: Secondary | ICD-10-CM | POA: Insufficient documentation

## 2017-09-19 DIAGNOSIS — N529 Male erectile dysfunction, unspecified: Secondary | ICD-10-CM | POA: Insufficient documentation

## 2017-09-19 DIAGNOSIS — Z79891 Long term (current) use of opiate analgesic: Secondary | ICD-10-CM | POA: Diagnosis not present

## 2017-09-19 DIAGNOSIS — R7982 Elevated C-reactive protein (CRP): Secondary | ICD-10-CM | POA: Diagnosis not present

## 2017-09-19 DIAGNOSIS — M25571 Pain in right ankle and joints of right foot: Secondary | ICD-10-CM

## 2017-09-19 DIAGNOSIS — M79671 Pain in right foot: Secondary | ICD-10-CM | POA: Diagnosis not present

## 2017-09-19 DIAGNOSIS — I2510 Atherosclerotic heart disease of native coronary artery without angina pectoris: Secondary | ICD-10-CM | POA: Insufficient documentation

## 2017-09-19 DIAGNOSIS — G894 Chronic pain syndrome: Secondary | ICD-10-CM | POA: Diagnosis not present

## 2017-09-19 DIAGNOSIS — M542 Cervicalgia: Secondary | ICD-10-CM | POA: Diagnosis present

## 2017-09-19 DIAGNOSIS — M5010 Cervical disc disorder with radiculopathy, unspecified cervical region: Secondary | ICD-10-CM | POA: Diagnosis not present

## 2017-09-19 DIAGNOSIS — M47812 Spondylosis without myelopathy or radiculopathy, cervical region: Secondary | ICD-10-CM | POA: Diagnosis not present

## 2017-09-19 DIAGNOSIS — Z7951 Long term (current) use of inhaled steroids: Secondary | ICD-10-CM | POA: Diagnosis not present

## 2017-09-19 DIAGNOSIS — E785 Hyperlipidemia, unspecified: Secondary | ICD-10-CM | POA: Insufficient documentation

## 2017-09-19 DIAGNOSIS — M4722 Other spondylosis with radiculopathy, cervical region: Secondary | ICD-10-CM

## 2017-09-19 DIAGNOSIS — Z7982 Long term (current) use of aspirin: Secondary | ICD-10-CM | POA: Insufficient documentation

## 2017-09-19 DIAGNOSIS — M546 Pain in thoracic spine: Secondary | ICD-10-CM | POA: Diagnosis not present

## 2017-09-19 DIAGNOSIS — M792 Neuralgia and neuritis, unspecified: Secondary | ICD-10-CM | POA: Diagnosis not present

## 2017-09-19 DIAGNOSIS — I10 Essential (primary) hypertension: Secondary | ICD-10-CM | POA: Diagnosis not present

## 2017-09-19 DIAGNOSIS — G4733 Obstructive sleep apnea (adult) (pediatric): Secondary | ICD-10-CM | POA: Insufficient documentation

## 2017-09-19 DIAGNOSIS — F17210 Nicotine dependence, cigarettes, uncomplicated: Secondary | ICD-10-CM | POA: Diagnosis not present

## 2017-09-19 MED ORDER — TRAMADOL HCL 50 MG PO TABS: 50.0000 mg | ORAL_TABLET | Freq: Four times a day (QID) | ORAL | 2 refills | Status: DC | PRN

## 2017-09-19 MED ORDER — CYCLOBENZAPRINE HCL 10 MG PO TABS: 10.0000 mg | ORAL_TABLET | Freq: Every day | ORAL | 2 refills | Status: DC

## 2017-09-19 MED ORDER — GABAPENTIN 300 MG PO CAPS: 900.0000 mg | ORAL_CAPSULE | Freq: Four times a day (QID) | ORAL | 2 refills | Status: DC

## 2017-09-19 NOTE — Patient Instructions (Signed)
____________________________________________________________________________________________  Medication Rules  Applies to: All patients receiving prescriptions (written or electronic).  Pharmacy of record: Pharmacy where electronic prescriptions will be sent. If written prescriptions are taken to a different pharmacy, please inform the nursing staff. The pharmacy listed in the electronic medical record should be the one where you would like electronic prescriptions to be sent.  Prescription refills: Only during scheduled appointments. Applies to both, written and electronic prescriptions.  NOTE: The following applies primarily to controlled substances (Opioid* Pain Medications).   Patient's responsibilities: 1. Pain Pills: Bring all pain pills to every appointment (except for procedure appointments). 2. Pill Bottles: Bring pills in original pharmacy bottle. Always bring newest bottle. Bring bottle, even if empty. 3. Medication refills: You are responsible for knowing and keeping track of what medications you need refilled. The day before your appointment, write a list of all prescriptions that need to be refilled. Bring that list to your appointment and give it to the admitting nurse. Prescriptions will be written only during appointments. If you forget a medication, it will not be "Called in", "Faxed", or "electronically sent". You will need to get another appointment to get these prescribed. 4. Prescription Accuracy: You are responsible for carefully inspecting your prescriptions before leaving our office. Have the discharge nurse carefully go over each prescription with you, before taking them home. Make sure that your name is accurately spelled, that your address is correct. Check the name and dose of your medication to make sure it is accurate. Check the number of pills, and the written instructions to make sure they are clear and accurate. Make sure that you are given enough medication to last  until your next medication refill appointment. 5. Taking Medication: Take medication as prescribed. Never take more pills than instructed. Never take medication more frequently than prescribed. Taking less pills or less frequently is permitted and encouraged, when it comes to controlled substances (written prescriptions).  6. Inform other Doctors: Always inform, all of your healthcare providers, of all the medications you take. 7. Pain Medication from other Providers: You are not allowed to accept any additional pain medication from any other Doctor or Healthcare provider. There are two exceptions to this rule. (see below) In the event that you require additional pain medication, you are responsible for notifying us, as stated below. 8. Medication Agreement: You are responsible for carefully reading and following our Medication Agreement. This must be signed before receiving any prescriptions from our practice. Safely store a copy of your signed Agreement. Violations to the Agreement will result in no further prescriptions. (Additional copies of our Medication Agreement are available upon request.) 9. Laws, Rules, & Regulations: All patients are expected to follow all Federal and State Laws, Statutes, Rules, & Regulations. Ignorance of the Laws does not constitute a valid excuse. The use of any illegal substances is prohibited. 10. Adopted CDC guidelines & recommendations: Target dosing levels will be at or below 60 MME/day. Use of benzodiazepines** is not recommended.  Exceptions: There are only two exceptions to the rule of not receiving pain medications from other Healthcare Providers. 1. Exception #1 (Emergencies): In the event of an emergency (i.e.: accident requiring emergency care), you are allowed to receive additional pain medication. However, you are responsible for: As soon as you are able, call our office [phone], at any time of the day or night, and leave a message stating your name, the  date and nature of the emergency, and the name and dose of the medication   prescribed. In the event that your call is answered by a member of our staff, make sure to document and save the date, time, and the name of the person that took your information.  2. Exception #2 (Planned Surgery): In the event that you are scheduled by another doctor or dentist to have any type of surgery or procedure, you are allowed (for a period no longer than 30 days), to receive additional pain medication, for the acute post-op pain. However, in this case, you are responsible for picking up a copy of our "Post-op Pain Management for Surgeons" handout, and giving it to your surgeon or dentist. This document is available at our office, and does not require an appointment to obtain it. Simply go to our office during business hours (Monday-Thursday from 8:00 AM to 4:00 PM) (Friday 8:00 AM to 12:00 Noon) or if you have a scheduled appointment with us, prior to your surgery, and ask for it by name. In addition, you will need to provide us with your name, name of your surgeon, type of surgery, and date of procedure or surgery.  *Opioid medications include: morphine, codeine, oxycodone, oxymorphone, hydrocodone, hydromorphone, meperidine, tramadol, tapentadol, buprenorphine, fentanyl, methadone. **Benzodiazepine medications include: diazepam (Valium), alprazolam (Xanax), clonazepam (Klonopine), lorazepam (Ativan), clorazepate (Tranxene), chlordiazepoxide (Librium), estazolam (Prosom), oxazepam (Serax), temazepam (Restoril), triazolam (Halcion) (Last updated: 03/03/2017) ____________________________________________________________________________________________    

## 2017-09-19 NOTE — Progress Notes (Signed)
Nursing Pain Medication Assessment:  Safety precautions to be maintained throughout the outpatient stay will include: orient to surroundings, keep bed in low position, maintain call bell within reach at all times, provide assistance with transfer out of bed and ambulation.  Medication Inspection Compliance: Pill count conducted under aseptic conditions, in front of the patient. Neither the pills nor the bottle was removed from the patient's sight at any time. Once count was completed pills were immediately returned to the patient in their original bottle.  Medication: Tramadol (Ultram) Pill/Patch Count: 39 of 60 pills remain Pill/Patch Appearance: Markings consistent with prescribed medication Bottle Appearance: Standard pharmacy container. Clearly labeled. Filled Date: 09 / 06 / 2019 Last Medication intake:  Today

## 2017-09-19 NOTE — Progress Notes (Addendum)
Patient's Name: Caleb [name]  MRN: [id number]  Referring Provider: Virginia Crews, MD  DOB: Jul 25, 1955  PCP: Virginia Crews, MD  DOS: 09/19/2017  Note by: Vevelyn Francois NP  Service setting: Ambulatory outpatient  Specialty: Interventional Pain Management  Location: [facility] (AMB) Pain Management Facility    Patient type: Established    Primary Reason(s) for Visit: Encounter for prescription drug management & post-procedure evaluation of chronic illness with mild to moderate exacerbation(Level of risk: moderate) CC: Neck Pain (worse on the left ) and Foot Pain (right)  HPI  Caleb [name] is a 62 y.o. year old, male patient, who comes today for a post-procedure evaluation and medication management. He has OSA (obstructive sleep apnea); Myocardial infarction ([place]); Hypertension; Hyperlipidemia; COPD (chronic obstructive pulmonary disease) ([place]); Chronic foot pain (Primary Area of Pain) (Right); Tobacco abuse; Erectile dysfunction; CAD (coronary artery disease), native coronary artery; Chronic ankle pain (Secondary Area of Pain) (Right); Chronic thoracic back pain [facility] Area of Pain) (Midline); Chronic pain syndrome; Long term current use of opiate analgesic; Pharmacologic therapy; Disorder of skeletal system; Problems influencing health status; Elevated C-reactive protein (CRP); Elevated sed rate; 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); Spondylosis without myelopathy or radiculopathy, cervicothoracic region; Chronic musculoskeletal pain; Neurogenic foot pain (Right); DDD (degenerative disc disease), cervical; 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 His primarily concern today is the Neck Pain (worse on the left ) and Foot Pain (right)  Pain Assessment: Location: Left(right foot ) Neck(left ) Radiating: going down the left arm and into shoulder blades  Onset: More than a month ago Duration: Chronic pain Quality: Discomfort, Heaviness, Burning, Stabbing, Constant, Tingling(when he holds his head up it feels like everything just bunches up and causes pain into shoulder blades and left arm.  sometimes it feels like his toes were going to blow off. ) Severity: 4 /10 (subjective, self-reported pain score)  Note: Reported level is compatible with observation.                          Effect on ADL: limited ROM in neck, unable to turn head either way.   Timing: Constant Modifying factors: procedure on the right leg, RF helped for a few days.  the neck procedure also helped for a few days.  Dr Dossie Arbour increased his tramadol but patient is not able to tolerate, makes him sick  BP: 108/73  HR: 62  Caleb [name] was last seen on 09/09/2017 for a procedure. During today's appointment we reviewed Caleb [name] post-procedure results, as well as his outpatient medication regimen.  Further details on both, my assessment(s), as well as the proposed treatment plan, please see below.  Controlled Substance Pharmacotherapy Assessment REMS (Risk Evaluation and Mitigation Strategy)  Analgesic: Tramadol 50 mg 1 tablet p.o. twice daily MME/day:'10mg'$ /day.  Janett Billow, RN  09/19/2017  9:52 AM  Sign at close encounter Nursing Pain Medication Assessment:  Safety precautions to be maintained throughout the outpatient stay will include: orient to surroundings, keep bed in low position, maintain call bell within reach at all times, provide  assistance  with transfer out of bed and ambulation.  Medication Inspection Compliance: Pill count conducted under aseptic conditions, in front of the patient. Neither the pills nor the bottle was removed from the patient's sight at any time. Once count was completed pills were immediately returned to the patient in their original bottle.  Medication: Tramadol (Ultram) Pill/Patch Count: 39 of 60 pills remain Pill/Patch Appearance: Markings consistent with prescribed medication Bottle Appearance: Standard pharmacy container. Clearly labeled. Filled Date: 09 / 06 / 2019 Last Medication intake:  Today   Pharmacokinetics: Liberation and absorption (onset of action): WNL Distribution (time to peak effect): WNL Metabolism and excretion (duration of action): WNL         Pharmacodynamics: Desired effects: Analgesia: Caleb [name] reports >50% benefit. Functional ability: Patient reports that medication allows him to accomplish basic ADLs Clinically meaningful improvement in function (CMIF): Sustained CMIF goals met Perceived effectiveness: Described as relatively effective, allowing for increase in activities of daily living (ADL) Undesirable effects: Side-effects or Adverse reactions: None reported Monitoring: [place] PMP: Online review of the past 39-monthperiod conducted. Compliant with practice rules and regulations Last UDS on record: Summary  Date Value Ref Range Status  02/17/2017 FINAL  Final    Comment:    ==================================================================== TOXASSURE COMP DRUG ANALYSIS,UR ==================================================================== Test                             Result       Flag       Units Drug Present and Declared for Prescription Verification   Tramadol                       >5556        EXPECTED   ng/mg creat   O-Desmethyltramadol            4917         EXPECTED   ng/mg creat   N-Desmethyltramadol            659          EXPECTED   ng/mg  creat    Source of tramadol is a prescription medication.    O-desmethyltramadol and N-desmethyltramadol are expected    metabolites of tramadol.   Gabapentin                     PRESENT      EXPECTED   Desmethylcyclobenzaprine       PRESENT      EXPECTED    Desmethylcyclobenzaprine is an expected metabolite of    cyclobenzaprine.   Metoprolol                     PRESENT      EXPECTED Drug Absent but Declared for Prescription Verification   Salicylate                     Not Detected UNEXPECTED    Aspirin, as indicated in the declared medication list, is not    always detected even when used as directed. ==================================================================== Test                      Result    Flag   Units      Ref Range   Creatinine              90  mg/dL      >=20 ==================================================================== Declared Medications:  The flagging and interpretation on this report are based on the  following declared medications.  Unexpected results may arise from  inaccuracies in the declared medications.  **Note: The testing scope of this panel includes these medications:  Cyclobenzaprine  Gabapentin  Metoprolol  Tramadol  **Note: The testing scope of this panel does not include small to  moderate amounts of these reported medications:  Aspirin (Aspirin 81)  **Note: The testing scope of this panel does not include following  reported medications:  Albuterol  Fluticasone  Hydrochlorothiazide  Isosorbide Mononitrate  Rosuvastatin  Sildenafil  Tiotropium  Triamcinolone acetonide  Varenicline ==================================================================== For clinical consultation, please call [phone]. ====================================================================    UDS interpretation: Compliant          Medication Assessment Form: Reviewed. Patient indicates being compliant with therapy Treatment  compliance: Compliant Risk Assessment Profile: Aberrant behavior: See prior evaluations. None observed or detected today Comorbid factors increasing risk of overdose: See prior notes. No additional risks detected today Opioid risk tool (ORT) (Total Score):   Personal History of Substance Abuse (SUD-Substance use disorder):  Alcohol:    Illegal Drugs:    Rx Drugs:    ORT Risk Level calculation:   Risk of substance use disorder (SUD): Low  ORT Scoring interpretation table:  Score <3 = Low Risk for SUD  Score between 4-7 = Moderate Risk for SUD  Score >8 = High Risk for Opioid Abuse   Risk Mitigation Strategies:  Patient Counseling: Covered Patient-Prescriber Agreement (PPA): Present and active  Notification to other healthcare providers: Done  Pharmacologic Plan: No change in therapy, at this time.             Post-Procedure Assessment  09/08/2017 procedure: Cervical facet nerve block Pre-procedure pain score:  4/10 Post-procedure pain score: 0/10         Influential Factors: BMI: 30.41 kg/m Intra-procedural challenges: None observed.         Assessment challenges: None detected.              Reported side-effects: None.        Post-procedural adverse reactions or complications: None reported         Sedation: Please see nurses note. When no sedatives are used, the analgesic levels obtained are directly associated to the effectiveness of the local anesthetics. However, when sedation is provided, the level of analgesia obtained during the initial 1 hour following the intervention, is believed to be the result of a combination of factors. These factors may include, but are not limited to: 1. The effectiveness of the local anesthetics used. 2. The effects of the analgesic(s) and/or anxiolytic(s) used. 3. The degree of discomfort experienced by the patient at the time of the procedure. 4. The patients ability and reliability in recalling and recording the events. 5. The presence and  influence of possible secondary gains and/or psychosocial factors. Reported result: Relief experienced during the 1st hour after the procedure: 100 % (Ultra-Short Term Relief)            Interpretative annotation: Clinically appropriate result. Analgesia during this period is likely to be Local Anesthetic and/or IV Sedative (Analgesic/Anxiolytic) related.          Effects of local anesthetic: The analgesic effects attained during this period are directly associated to the localized infiltration of local anesthetics and therefore cary significant diagnostic value as to the etiological location, or anatomical origin, of the pain. Expected duration  of relief is directly dependent on the pharmacodynamics of the local anesthetic used. Long-acting (4-6 hours) anesthetics used.  Reported result: Relief during the next 4 to 6 hour after the procedure: 100 % (Short-Term Relief)            Interpretative annotation: Clinically appropriate result. Analgesia during this period is likely to be Local Anesthetic-related.          Long-term benefit: Defined as the period of time past the expected duration of local anesthetics (1 hour for short-acting and 4-6 hours for long-acting). With the possible exception of prolonged sympathetic blockade from the local anesthetics, benefits during this period are typically attributed to, or associated with, other factors such as analgesic sensory neuropraxia, antiinflammatory effects, or beneficial biochemical changes provided by agents other than the local anesthetics.  Reported result: Extended relief following procedure: 100 %(patient had good relief from cervical facet til approx the 10th.  when the pain came back, it came back with a vengence and continues to be painful ) (Long-Term Relief)            Interpretative annotation: Clinically possible results. Good relief. No permanent benefit expected. Inflammation plays a part in the etiology to the pain.          Current  benefits: Defined as reported results that persistent at this point in time.   Analgesia: 50 %            Function: Back to baseline ROM: Back to baseline Interpretative annotation: Recurrence of symptoms. No permanent benefit expected. Effective diagnostic intervention.          Interpretation: Results would suggest a successful diagnostic intervention.                  Plan:  Please see "Plan of Care" for details.                Laboratory Chemistry  Inflammation Markers (CRP: Acute Phase) (ESR: Chronic Phase) Lab Results  Component Value Date   CRP 7.3 (H) 02/17/2017   ESRSEDRATE 38 (H) 02/17/2017                         Rheumatology Markers Lab Results  Component Value Date   RF <10.0 02/28/2017   ANA Negative 02/28/2017                        Renal Function Markers Lab Results  Component Value Date   BUN 18 05/17/2017   CREATININE 0.78 05/17/2017   BCR 22 (H) 02/17/2017   GFRAA >60 05/17/2017   GFRNONAA >60 05/17/2017                             Hepatic Function Markers Lab Results  Component Value Date   AST 19 02/17/2017   ALT 13 10/21/2016   ALBUMIN 4.4 02/17/2017   ALKPHOS 89 02/17/2017                        Electrolytes Lab Results  Component Value Date   NA 136 05/17/2017   K 3.8 05/17/2017   CL 95 (L) 05/17/2017   CALCIUM 10.2 05/17/2017   MG 2.0 02/17/2017                        Neuropathy Markers Lab Results  Component Value Date  VITAMINB12 403 02/17/2017   HIV Non Reactive 02/04/2017                        CNS Tests No results found for: COLORCSF, APPEARCSF, RBCCOUNTCSF, WBCCSF, POLYSCSF, LYMPHSCSF, EOSCSF, PROTEINCSF, GLUCCSF, JCVIRUS, CSFOLI, IGGCSF                      Bone Pathology Markers Lab Results  Component Value Date   25OHVITD1 32 02/17/2017   25OHVITD2 <1.0 02/17/2017   25OHVITD3 32 02/17/2017   TESTOFREE 5.2 (L) 02/28/2017   TESTOSTERONE 279 02/28/2017                         Coagulation Parameters Lab Results   Component Value Date   PLT 269 05/17/2017                        Cardiovascular Markers Lab Results  Component Value Date   TROPONINI <0.03 05/17/2017   HGB 16.8 05/17/2017   HCT 49.5 05/17/2017                         CA Markers No results found for: CEA, CA125, LABCA2                      Note: Lab results reviewed.  Recent Diagnostic Imaging Results  DG C-Arm 1-60 Min-No Report Fluoroscopy was utilized by the requesting physician.  No radiographic  interpretation.   Complexity Note: Imaging results reviewed. Results shared with Caleb [name], using Layman's terms.                         Meds   Current Outpatient Medications:  .  albuterol (PROVENTIL HFA;VENTOLIN HFA) 108 (90 Base) MCG/ACT inhaler, Inhale 1-2 puffs every 6 (six) hours as needed into the lungs for wheezing or shortness of breath., Disp: 1 Inhaler, Rfl: 5 .  aspirin EC 81 MG tablet, Take 81 mg by mouth daily., Disp: , Rfl:  .  cyclobenzaprine (FLEXERIL) 10 MG tablet, Take 1 tablet (10 mg total) by mouth at bedtime., Disp: 30 tablet, Rfl: 2 .  fluticasone (FLOVENT HFA) 220 MCG/ACT inhaler, Inhale 1 puff 2 (two) times daily into the lungs., Disp: 1 Inhaler, Rfl: 5 .  gabapentin (NEURONTIN) 300 MG capsule, Take 3 capsules (900 mg total) by mouth 4 (four) times daily., Disp: 360 capsule, Rfl: 2 .  hydrochlorothiazide (HYDRODIURIL) 25 MG tablet, Take 1 tablet (25 mg total) by mouth daily., Disp: 90 tablet, Rfl: 3 .  isosorbide mononitrate (IMDUR) 30 MG 24 hr tablet, TAKE 0.5 TABLET(S) EVERY DAY BY ORAL ROUTE IN THE MORNING., Disp: 15 tablet, Rfl: 5 .  losartan (COZAAR) 100 MG tablet, Take 1 tablet (100 mg total) by mouth daily., Disp: 30 tablet, Rfl: 5 .  metoprolol tartrate (LOPRESSOR) 25 MG tablet, TAKE 1 TABLET BY MOUTH TWICE A DAY, Disp: 60 tablet, Rfl: 2 .  nitroGLYCERIN (NITROSTAT) 0.4 MG SL tablet, Place 1 tablet (0.4 mg total) under the tongue every 5 (five) minutes as needed for chest pain., Disp: 30 tablet,  Rfl: 3 .  rosuvastatin (CRESTOR) 40 MG tablet, TAKE 1 TABLET BY MOUTH EVERY DAY, Disp: 90 tablet, Rfl: 3 .  Secukinumab (COSENTYX, 300 MG DOSE, [place]), Inject 300 mg into the skin every 30 (thirty) days. , Disp: , Rfl:  .  tiotropium (SPIRIVA) 18 MCG inhalation capsule, Place 1 capsule (18 mcg total) daily into inhaler and inhale., Disp: 30 capsule, Rfl: 5 .  [START ON 10/09/2017] traMADol (ULTRAM) 50 MG tablet, Take 1-2 tablets (50-100 mg total) by mouth every 6 (six) hours as needed for severe pain., Disp: 60 tablet, Rfl: 2 .  triamcinolone ointment (KENALOG) 0.5 %, Apply 1 application 2 (two) times daily topically. On psoriasis lesions, Disp: 30 g, Rfl: 2  ROS  Constitutional: Denies any fever or chills Gastrointestinal: No reported hemesis, hematochezia, vomiting, or acute GI distress Musculoskeletal: Denies any acute onset joint swelling, redness, loss of ROM, or weakness Neurological: No reported episodes of acute onset apraxia, aphasia, dysarthria, agnosia, amnesia, paralysis, loss of coordination, or loss of consciousness  Allergies  Caleb [name] is allergic to lisinopril.  [place]  Drug: Caleb [name]  reports that he does not use drugs. Alcohol:  reports that he drinks about 4.0 standard drinks of alcohol per week. Tobacco:  reports that he has been smoking cigarettes. He has a 24.50 pack-year smoking history. He has never used smokeless tobacco. Medical:  has a past medical history of CHF (congestive heart failure) ([place]), Chronic foot pain, right (2015), COPD (chronic obstructive pulmonary disease) ([place]), Coronary artery disease, Hyperlipidemia, Hypertension, Myocardial infarction ([place]) (2015), and OSA on CPAP. Surgical: Caleb [name]  has a past surgical history that includes Cardiac catheterization (2015); Lumbar laminectomy (1989, 1999); Cervical fusion (1988, 1998); Liver surgery (2015); Partial colectomy (1990); Inguinal hernia repair (Bilateral, 1975); Lithotripsy; and Colonoscopy with  propofol (N/A, 02/24/2017). Family: family history includes Alzheimer's disease in his paternal grandmother; Alzheimer's disease (age of onset: 38) in his father; Breast cancer in his maternal uncle; CAD in his mother; Diabetes in his maternal grandmother; Healthy in his sister; Heart attack in his maternal uncle; Heart failure in his maternal grandmother; Heart failure (age of onset: 13) in his mother; Non-Hodgkin's lymphoma in his sister.  Constitutional Exam  General appearance: Well nourished, well developed, and well hydrated. In no apparent acute distress Vitals:   09/19/17 0938  BP: 108/73  Pulse: 62  Resp: 16  Temp: 97.7 F (36.5 C)  TempSrc: Oral  SpO2: 97%  Weight: 200 lb (90.7 kg)  Height: '5\' 8"'$  (1.727 m)   BMI Assessment: Estimated body mass index is 30.41 kg/m as calculated from the following:   Height as of this encounter: '5\' 8"'$  (1.727 m).   Weight as of this encounter: 200 lb (90.7 kg). Psych/Mental status: Alert, oriented x 3 (person, place, & time)       Eyes: PERLA Respiratory: No evidence of acute respiratory distress  Cervical Spine Area Exam  Skin & Axial Inspection: No masses, redness, edema, swelling, or associated skin lesions Alignment: Asymmetric Functional ROM: Restricted ROM      Stability: No instability detected Muscle Tone/Strength: Functionally intact. No obvious neuro-muscular anomalies detected. Sensory (Neurological): Unimpaired Palpation: Complains of area being tender to palpation              Upper Extremity (UE) Exam    Side: Right upper extremity  Side: Left upper extremity  Skin & Extremity Inspection: Skin color, temperature, and hair growth are WNL. No peripheral edema or cyanosis. No masses, redness, swelling, asymmetry, or associated skin lesions. No contractures.  Skin & Extremity Inspection: Skin color, temperature, and hair growth are WNL. No peripheral edema or cyanosis. No masses, redness, swelling, asymmetry, or associated skin  lesions. No contractures.  Functional ROM: Unrestricted ROM  Functional ROM: Unrestricted ROM          Muscle Tone/Strength: Functionally intact. No obvious neuro-muscular anomalies detected.  Muscle Tone/Strength: Functionally intact. No obvious neuro-muscular anomalies detected.  Sensory (Neurological): Unimpaired          Sensory (Neurological): Unimpaired          Palpation: No palpable anomalies              Palpation: No palpable anomalies              Provocative Test(s):  Phalen's test: deferred Tinel's test: deferred Apley's scratch test (touch opposite shoulder):  Action 1 (Across chest): deferred Action 2 (Overhead): deferred Action 3 (LB reach): deferred   Provocative Test(s):  Phalen's test: deferred Tinel's test: deferred Apley's scratch test (touch opposite shoulder):  Action 1 (Across chest): deferred Action 2 (Overhead): deferred Action 3 (LB reach): deferred    Thoracic Spine Area Exam  Skin & Axial Inspection: No masses, redness, or swelling Alignment: Symmetrical Functional ROM: Unrestricted ROM Stability: No instability detected Muscle Tone/Strength: Functionally intact. No obvious neuro-muscular anomalies detected. Sensory (Neurological): Unimpaired Muscle strength & Tone: No palpable anomalies  Lumbar Spine Area Exam  Skin & Axial Inspection: No masses, redness, or swelling Alignment: Symmetrical Functional ROM: Unrestricted ROM       Stability: No instability detected Muscle Tone/Strength: Functionally intact. No obvious neuro-muscular anomalies detected. Sensory (Neurological): Unimpaired Palpation: No palpable anomalies       Provocative Tests: Hyperextension/rotation test: deferred today       Lumbar quadrant test (Kemp's test): deferred today       Lateral bending test: deferred today       Patrick's Maneuver: deferred today                   FABER test: deferred today                   S-I anterior distraction/compression test: deferred  today         S-I lateral compression test: deferred today         S-I Thigh-thrust test: deferred today         S-I Gaenslen's test: deferred today          Gait & Posture Assessment  Ambulation: Unassisted Gait: Relatively normal for age and body habitus Posture: WNL   Lower Extremity Exam    Side: Right lower extremity  Side: Left lower extremity  Stability: No instability observed          Stability: No instability observed          Skin & Extremity Inspection: Evidence of prior arthroplastic surgery  Skin & Extremity Inspection: Skin color, temperature, and hair growth are WNL. No peripheral edema or cyanosis. No masses, redness, swelling, asymmetry, or associated skin lesions. No contractures.  Functional ROM: Decreased ROM                  Functional ROM: Unrestricted ROM                  Muscle Tone/Strength: Functionally intact. No obvious neuro-muscular anomalies detected.  Muscle Tone/Strength: Functionally intact. No obvious neuro-muscular anomalies detected.  Sensory (Neurological): Hyperalgesia (Increased sensitivity to pain)  Sensory (Neurological): Unimpaired  Palpation: Tender  Palpation: No palpable anomalies   Assessment  Primary Diagnosis & Pertinent Problem List: The primary encounter diagnosis was Chronic ankle pain (Secondary Area of Pain) (Right). Diagnoses of Cervical facet syndrome (Bilateral), Cervical spondylosis  w/ radiculopathy, Chronic foot pain (Primary Area of Pain) (Right), Chronic thoracic back pain (Tertiary Area of Pain) (Midline), Chronic pain syndrome, Chronic musculoskeletal pain, and Neurogenic pain of right foot were also pertinent to this visit.  Status Diagnosis  Persistent Persistent Persistent 1. Chronic ankle pain (Secondary Area of Pain) (Right)   2. Cervical facet syndrome (Bilateral)   3. Cervical spondylosis w/ radiculopathy   4. Chronic foot pain (Primary Area of Pain) (Right)   5. Chronic thoracic back pain [facility] Area of Pain)  (Midline)   6. Chronic pain syndrome   7. Chronic musculoskeletal pain   8. Neurogenic pain of right foot     Problems updated and reviewed during this visit: No problems updated. Plan of Care  Pharmacotherapy (Medications Ordered): Meds ordered this encounter  Medications  . cyclobenzaprine (FLEXERIL) 10 MG tablet    Sig: Take 1 tablet (10 mg total) by mouth at bedtime.    Dispense:  30 tablet    Refill:  2    Do not place medication on "Automatic Refill". Fill one day early if pharmacy is closed on scheduled refill date.    Order Specific Question:   Supervising Provider    Answer:   Milinda Pointer [phone]  . gabapentin (NEURONTIN) 300 MG capsule    Sig: Take 3 capsules (900 mg total) by mouth 4 (four) times daily.    Dispense:  360 capsule    Refill:  2    Do not place medication on "Automatic Refill". Fill one day early if pharmacy is closed on scheduled refill date.    Order Specific Question:   Supervising Provider    Answer:   Milinda Pointer [phone]  . traMADol (ULTRAM) 50 MG tablet    Sig: Take 1-2 tablets (50-100 mg total) by mouth every 6 (six) hours as needed for severe pain.    Dispense:  60 tablet    Refill:  2    Fill one day early if pharmacy is closed on scheduled refill date.    Order Specific Question:   Supervising Provider    Answer:   Milinda Pointer [[id number]]   New Prescriptions   No medications on file   Medications administered today: Caleb [name] had no medications administered during this visit. Lab-work, procedure(s), and/or referral(s): Orders Placed This Encounter  Procedures  . CERVICAL FACET (MEDIAL BRANCH NERVE BLOCK)    Imaging and/or referral(s): None  Interventional management options: Planned, scheduled, and/or pending:   (R) Superficial Peroneal N. + Saphenous NB. Diagnostic bilateral cervical facet block #2 in the future patient is aware that this will be scheduled after the above procedure.   Considering:    Palliative/diagnostic right-sidedCommon Peroneal nerve block#3 Palliative right-sided common peroneal nerve RFA #2(last done on 06/21/2017) Diagnostic right ankle block Diagnosticright lumbar sympathetic block Diagnostic midlinethoracic epidural steroid injection Diagnostic bilateral thoracic facet block Possible bilateral thoracic facet RFA Diagnostic cervical epidural steroid injection Diagnostic bilateral cervical facet block Possible bilateral cervical facet RFA   Palliative PRN treatment(s):   Palliative left sided cervical epidural steroid injection #4 Palliative right-sided common peroneal nerve blocks Palliative right-sided common peroneal nerve RFA #2(last done on 06/21/2017)    Provider-requested follow-up: Return in about 3 months (around 12/19/2017) for MedMgmt with Me Dionisio David).  Future Appointments  Date Time Provider [place]  10/04/2017  9:15 AM Milinda Pointer, MD [facility] None  12/14/2017 10:00 AM Virginia Crews, MD [facility] None  12/19/2017  9:00 AM Vevelyn Francois, NP [facility]  None   Primary Care Physician: Virginia Crews, MD Location: [facility] Note by: Vevelyn Francois NP Date: 09/19/2017; Time: 3:00 PM  Pain Score Disclaimer: We use the NRS-11 scale. This is a self-reported, subjective measurement of pain severity with only modest accuracy. It is used primarily to identify changes within a particular patient. It must be understood that outpatient pain scales are significantly less accurate that those used for research, where they can be applied under ideal controlled circumstances with minimal exposure to variables. In reality, the score is likely to be a combination of pain intensity and pain affect, where pain affect describes the degree of emotional arousal or changes in action readiness caused by the sensory experience of pain. Factors such as social and work situation, setting,  emotional state, anxiety levels, expectation, and prior pain experience may influence pain perception and show large inter-individual differences that may also be affected by time variables.  Patient instructions provided during this appointment: Patient Instructions  ____________________________________________________________________________________________  Medication Rules  Applies to: All patients receiving prescriptions (written or electronic).  Pharmacy of record: Pharmacy where electronic prescriptions will be sent. If written prescriptions are taken to a different pharmacy, please inform the nursing staff. The pharmacy listed in the electronic medical record should be the one where you would like electronic prescriptions to be sent.  Prescription refills: Only during scheduled appointments. Applies to both, written and electronic prescriptions.  NOTE: The following applies primarily to controlled substances (Opioid* Pain Medications).   Patient's responsibilities: 1. Pain Pills: Bring all pain pills to every appointment (except for procedure appointments). 2. Pill Bottles: Bring pills in original pharmacy bottle. Always bring newest bottle. Bring bottle, even if empty. 3. Medication refills: You are responsible for knowing and keeping track of what medications you need refilled. The day before your appointment, write a list of all prescriptions that need to be refilled. Bring that list to your appointment and give it to the admitting nurse. Prescriptions will be written only during appointments. If you forget a medication, it will not be "Called in", "Faxed", or "electronically sent". You will need to get another appointment to get these prescribed. 4. Prescription Accuracy: You are responsible for carefully inspecting your prescriptions before leaving our office. Have the discharge nurse carefully go over each prescription with you, before taking them home. Make sure that your name is  accurately spelled, that your address is correct. Check the name and dose of your medication to make sure it is accurate. Check the number of pills, and the written instructions to make sure they are clear and accurate. Make sure that you are given enough medication to last until your next medication refill appointment. 5. Taking Medication: Take medication as prescribed. Never take more pills than instructed. Never take medication more frequently than prescribed. Taking less pills or less frequently is permitted and encouraged, when it comes to controlled substances (written prescriptions).  6. Inform other Doctors: Always inform, all of your healthcare providers, of all the medications you take. 7. Pain Medication from other Providers: You are not allowed to accept any additional pain medication from any other Doctor or Healthcare provider. There are two exceptions to this rule. (see below) In the event that you require additional pain medication, you are responsible for notifying us, as stated below. 8. Medication Agreement: You are responsible for carefully reading and following our Medication Agreement. This must be signed before receiving any prescriptions from our practice. Safely store a copy  of your signed Agreement. Violations to the Agreement will result in no further prescriptions. (Additional copies of our Medication Agreement are available upon request.) 9. Laws, Rules, & Regulations: All patients are expected to follow all Federal and [employer], Rules, [employer]. Ignorance of the Laws does not constitute a valid excuse. The use of any illegal substances is prohibited. 10. Adopted CDC guidelines & recommendations: Target dosing levels will be at or below 60 MME/day. Use of benzodiazepines** is not recommended.  Exceptions: There are only two exceptions to the rule of not receiving pain medications from other Healthcare Providers. 1. Exception #1 (Emergencies): In the event of an  emergency (i.e.: accident requiring emergency care), you are allowed to receive additional pain medication. However, you are responsible for: As soon as you are able, call our office [phone], at any time of the day or night, and leave a message stating your name, the date and nature of the emergency, and the name and dose of the medication prescribed. In the event that your call is answered by a member of our staff, make sure to document and save the date, time, and the name of the person that took your information.  2. Exception #2 (Planned Surgery): In the event that you are scheduled by another doctor or dentist to have any type of surgery or procedure, you are allowed (for a period no longer than 30 days), to receive additional pain medication, for the acute post-op pain. However, in this case, you are responsible for picking up a copy of our "Post-op Pain Management for Surgeons" handout, and giving it to your surgeon or dentist. This document is available at our office, and does not require an appointment to obtain it. Simply go to our office during business hours (Monday-Thursday from 8:00 AM to 4:00 PM) (Friday 8:00 AM to 12:00 Noon) or if you have a scheduled appointment with [place], prior to your surgery, and ask for it by name. In addition, you will need to provide [place] with your name, name of your surgeon, type of surgery, and date of procedure or surgery.  *Opioid medications include: morphine, codeine, oxycodone, oxymorphone, hydrocodone, hydromorphone, meperidine, tramadol, tapentadol, buprenorphine, fentanyl, methadone. **Benzodiazepine medications include: diazepam (Valium), alprazolam (Xanax), clonazepam (Klonopine), lorazepam (Ativan), clorazepate (Tranxene), chlordiazepoxide (Librium), estazolam (Prosom), oxazepam (Serax), temazepam (Restoril), triazolam (Halcion) (Last updated:  03/03/2017) ____________________________________________________________________________________________

## 2017-09-20 ENCOUNTER — Telehealth: Payer: Self-pay | Admitting: Pain Medicine

## 2017-09-20 NOTE — Progress Notes (Signed)
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 treatment(s):   Palliative left sided cervical epidural steroid injection #4 Palliative right-sided common peroneal nerve blocks Palliative right-sided common peroneal nerve RFA #2(last done on 06/21/2017)   Provider-requested follow-up: Return for Procedure (  w/ sedation): (B) C-FCT BLK #2.  Future Appointments  Date Time Provider [place]  09/22/2017  9:45 AM Milinda Pointer, MD [facility] None  10/04/2017  9:15 AM Milinda Pointer, MD [facility] None  12/14/2017 10:00 AM Virginia Crews, MD [facility] None  12/19/2017  9:00 AM Vevelyn Francois, NP [facility] None   Primary Care Physician: Virginia Crews, MD Location: [facility] Facility Note by: Gaspar Cola, MD Date: 09/21/2017; Time: 1:18 PM

## 2017-09-20 NOTE — Telephone Encounter (Signed)
Error

## 2017-09-21 ENCOUNTER — Other Ambulatory Visit: Payer: Self-pay

## 2017-09-21 ENCOUNTER — Encounter: Payer: Self-pay | Admitting: Pain Medicine

## 2017-09-21 ENCOUNTER — Ambulatory Visit: Payer: Medicaid Other | Attending: Pain Medicine | Admitting: Pain Medicine

## 2017-09-21 VITALS — BP 114/101 | HR 65 | Temp 97.9°F | Ht 68.0 in | Wt 200.0 lb

## 2017-09-21 DIAGNOSIS — I110 Hypertensive heart disease with heart failure: Secondary | ICD-10-CM | POA: Diagnosis not present

## 2017-09-21 DIAGNOSIS — M542 Cervicalgia: Secondary | ICD-10-CM

## 2017-09-21 DIAGNOSIS — M4803 Spinal stenosis, cervicothoracic region: Secondary | ICD-10-CM | POA: Insufficient documentation

## 2017-09-21 DIAGNOSIS — N529 Male erectile dysfunction, unspecified: Secondary | ICD-10-CM | POA: Diagnosis not present

## 2017-09-21 DIAGNOSIS — G4733 Obstructive sleep apnea (adult) (pediatric): Secondary | ICD-10-CM | POA: Diagnosis not present

## 2017-09-21 DIAGNOSIS — M5412 Radiculopathy, cervical region: Secondary | ICD-10-CM

## 2017-09-21 DIAGNOSIS — M4802 Spinal stenosis, cervical region: Secondary | ICD-10-CM

## 2017-09-21 DIAGNOSIS — M25571 Pain in right ankle and joints of right foot: Secondary | ICD-10-CM | POA: Insufficient documentation

## 2017-09-21 DIAGNOSIS — I252 Old myocardial infarction: Secondary | ICD-10-CM | POA: Diagnosis not present

## 2017-09-21 DIAGNOSIS — Z888 Allergy status to other drugs, medicaments and biological substances status: Secondary | ICD-10-CM | POA: Diagnosis not present

## 2017-09-21 DIAGNOSIS — M5134 Other intervertebral disc degeneration, thoracic region: Secondary | ICD-10-CM | POA: Diagnosis not present

## 2017-09-21 DIAGNOSIS — G894 Chronic pain syndrome: Secondary | ICD-10-CM | POA: Insufficient documentation

## 2017-09-21 DIAGNOSIS — Z79899 Other long term (current) drug therapy: Secondary | ICD-10-CM | POA: Insufficient documentation

## 2017-09-21 DIAGNOSIS — J449 Chronic obstructive pulmonary disease, unspecified: Secondary | ICD-10-CM | POA: Diagnosis not present

## 2017-09-21 DIAGNOSIS — I2510 Atherosclerotic heart disease of native coronary artery without angina pectoris: Secondary | ICD-10-CM | POA: Insufficient documentation

## 2017-09-21 DIAGNOSIS — Z9889 Other specified postprocedural states: Secondary | ICD-10-CM | POA: Insufficient documentation

## 2017-09-21 DIAGNOSIS — E785 Hyperlipidemia, unspecified: Secondary | ICD-10-CM | POA: Diagnosis not present

## 2017-09-21 DIAGNOSIS — I219 Acute myocardial infarction, unspecified: Secondary | ICD-10-CM | POA: Diagnosis not present

## 2017-09-21 DIAGNOSIS — Z7982 Long term (current) use of aspirin: Secondary | ICD-10-CM | POA: Diagnosis not present

## 2017-09-21 DIAGNOSIS — M9981 Other biomechanical lesions of cervical region: Secondary | ICD-10-CM | POA: Diagnosis not present

## 2017-09-21 DIAGNOSIS — M5030 Other cervical disc degeneration, unspecified cervical region: Secondary | ICD-10-CM | POA: Insufficient documentation

## 2017-09-21 DIAGNOSIS — Z79891 Long term (current) use of opiate analgesic: Secondary | ICD-10-CM | POA: Diagnosis not present

## 2017-09-21 DIAGNOSIS — M4722 Other spondylosis with radiculopathy, cervical region: Secondary | ICD-10-CM | POA: Insufficient documentation

## 2017-09-21 DIAGNOSIS — M19071 Primary osteoarthritis, right ankle and foot: Secondary | ICD-10-CM | POA: Diagnosis not present

## 2017-09-21 DIAGNOSIS — F17210 Nicotine dependence, cigarettes, uncomplicated: Secondary | ICD-10-CM | POA: Insufficient documentation

## 2017-09-21 DIAGNOSIS — M47812 Spondylosis without myelopathy or radiculopathy, cervical region: Secondary | ICD-10-CM | POA: Diagnosis not present

## 2017-09-21 DIAGNOSIS — Z8249 Family history of ischemic heart disease and other diseases of the circulatory system: Secondary | ICD-10-CM | POA: Diagnosis not present

## 2017-09-21 NOTE — Patient Instructions (Signed)
____________________________________________________________________________________________  Preparing for Procedure with Sedation  Instructions: . Oral Intake: Do not eat or drink anything for at least 8 hours prior to your procedure. . Transportation: Public transportation is not allowed. Bring an adult driver. The driver must be physically present in our waiting room before any procedure can be started. . Physical Assistance: Bring an adult physically capable of assisting you, in the event you need help. This adult should keep you company at home for at least 6 hours after the procedure. . Blood Pressure Medicine: Take your blood pressure medicine with a sip of water the morning of the procedure. . Blood thinners: Notify our staff if you are taking any blood thinners. Depending on which one you take, there will be specific instructions on how and when to stop it. . Diabetics on insulin: Notify the staff so that you can be scheduled 1st case in the morning. If your diabetes requires high dose insulin, take only  of your normal insulin dose the morning of the procedure and notify the staff that you have done so. . Preventing infections: Shower with an antibacterial soap the morning of your procedure. . Build-up your immune system: Take 1000 mg of Vitamin C with every meal (3 times a day) the day prior to your procedure. . Antibiotics: Inform the staff if you have a condition or reason that requires you to take antibiotics before dental procedures. . Pregnancy: If you are pregnant, call and cancel the procedure. . Sickness: If you have a cold, fever, or any active infections, call and cancel the procedure. . Arrival: You must be in the facility at least 30 minutes prior to your scheduled procedure. . Children: Do not bring children with you. . Dress appropriately: Bring dark clothing that you would not mind if they get stained. . Valuables: Do not bring any jewelry or valuables.  Procedure  appointments are reserved for interventional treatments only. . No Prescription Refills. . No medication changes will be discussed during procedure appointments. . No disability issues will be discussed.  Reasons to call and reschedule or cancel your procedure: (Following these recommendations will minimize the risk of a serious complication.) . Surgeries: Avoid having procedures within 2 weeks of any surgery. (Avoid for 2 weeks before or after any surgery). . Flu Shots: Avoid having procedures within 2 weeks of a flu shots or . (Avoid for 2 weeks before or after immunizations). . Barium: Avoid having a procedure within 7-10 days after having had a radiological study involving the use of radiological contrast. (Myelograms, Barium swallow or enema study). . Heart attacks: Avoid any elective procedures or surgeries for the initial 6 months after a "Myocardial Infarction" (Heart Attack). . Blood thinners: It is imperative that you stop these medications before procedures. Let us know if you if you take any blood thinner.  . Infection: Avoid procedures during or within two weeks of an infection (including chest colds or gastrointestinal problems). Symptoms associated with infections include: Localized redness, fever, chills, night sweats or profuse sweating, burning sensation when voiding, cough, congestion, stuffiness, runny nose, sore throat, diarrhea, nausea, vomiting, cold or Flu symptoms, recent or current infections. It is specially important if the infection is over the area that we intend to treat. . Heart and lung problems: Symptoms that may suggest an active cardiopulmonary problem include: cough, chest pain, breathing difficulties or shortness of breath, dizziness, ankle swelling, uncontrolled high or unusually low blood pressure, and/or palpitations. If you are experiencing any of these symptoms, cancel   your procedure and contact your primary care physician for an evaluation.  Remember:   Regular Business hours are:  Monday to Thursday 8:00 AM to 4:00 PM  Provider's Schedule: Donasia Wimes, MD:  Procedure days: Tuesday and Thursday 7:30 AM to 4:00 PM  Bilal Lateef, MD:  Procedure days: Monday and Wednesday 7:30 AM to 4:00 PM ____________________________________________________________________________________________    

## 2017-09-22 ENCOUNTER — Ambulatory Visit: Payer: Medicaid Other | Admitting: Pain Medicine

## 2017-09-22 ENCOUNTER — Encounter: Payer: Self-pay | Admitting: Pain Medicine

## 2017-09-22 ENCOUNTER — Ambulatory Visit
Admission: RE | Admit: 2017-09-22 | Discharge: 2017-09-22 | Disposition: A | Discharge status: Home / Self Care | Payer: Medicaid Other | Source: Ambulatory Visit | Attending: Pain Medicine | Admitting: Pain Medicine

## 2017-09-22 ENCOUNTER — Other Ambulatory Visit: Payer: Self-pay

## 2017-09-22 VITALS — BP 113/92 | HR 64 | Temp 97.2°F | Resp 16 | Ht 68.0 in | Wt 200.0 lb

## 2017-09-22 DIAGNOSIS — M5030 Other cervical disc degeneration, unspecified cervical region: Secondary | ICD-10-CM

## 2017-09-22 DIAGNOSIS — M47812 Spondylosis without myelopathy or radiculopathy, cervical region: Secondary | ICD-10-CM | POA: Diagnosis not present

## 2017-09-22 DIAGNOSIS — M4722 Other spondylosis with radiculopathy, cervical region: Secondary | ICD-10-CM

## 2017-09-22 DIAGNOSIS — M542 Cervicalgia: Secondary | ICD-10-CM | POA: Diagnosis not present

## 2017-09-22 MED ORDER — ROPIVACAINE HCL 2 MG/ML IJ SOLN
18.0000 mL | Freq: Once | INTRAMUSCULAR | Status: AC
  Administered 2017-09-22: 18 mL via PERINEURAL

## 2017-09-22 MED ORDER — MIDAZOLAM HCL 5 MG/5ML IJ SOLN
1.0000 mg | INTRAMUSCULAR | Status: DC | PRN
  Administered 2017-09-22: 4 mg via INTRAVENOUS

## 2017-09-22 MED ORDER — FENTANYL CITRATE (PF) 100 MCG/2ML IJ SOLN
25.0000 ug | INTRAMUSCULAR | Status: DC | PRN
  Administered 2017-09-22: 50 ug via INTRAVENOUS

## 2017-09-22 MED ORDER — MIDAZOLAM HCL 5 MG/5ML IJ SOLN
INTRAMUSCULAR | Status: AC
  Filled 2017-09-22: qty 5

## 2017-09-22 MED ORDER — LIDOCAINE HCL 2 % IJ SOLN
INTRAMUSCULAR | Status: AC
  Filled 2017-09-22: qty 20

## 2017-09-22 MED ORDER — LIDOCAINE HCL 2 % IJ SOLN
20.0000 mL | Freq: Once | INTRAMUSCULAR | Status: AC
  Administered 2017-09-22: 400 mg

## 2017-09-22 MED ORDER — DEXAMETHASONE SODIUM PHOSPHATE 10 MG/ML IJ SOLN
INTRAMUSCULAR | Status: AC
  Filled 2017-09-22: qty 2

## 2017-09-22 MED ORDER — LACTATED RINGERS IV SOLN
1000.0000 mL | Freq: Once | INTRAVENOUS | Status: AC
  Administered 2017-09-22: 1000 mL via INTRAVENOUS

## 2017-09-22 MED ORDER — DEXAMETHASONE SODIUM PHOSPHATE 10 MG/ML IJ SOLN
20.0000 mg | Freq: Once | INTRAMUSCULAR | Status: AC
  Administered 2017-09-22: 20 mg

## 2017-09-22 MED ORDER — ROPIVACAINE HCL 2 MG/ML IJ SOLN
INTRAMUSCULAR | Status: AC
  Filled 2017-09-22: qty 20

## 2017-09-22 MED ORDER — FENTANYL CITRATE (PF) 100 MCG/2ML IJ SOLN
INTRAMUSCULAR | Status: AC
  Filled 2017-09-22: qty 4

## 2017-09-22 NOTE — Progress Notes (Signed)
Patient's Name: Caleb [name]  MRN: [id number]  Referring Provider: Virginia Crews, MD  DOB: [birth year]  PCP: Virginia Crews, MD  DOS: 09/22/2017  Note by: Gaspar Cola, MD  Service setting: Ambulatory outpatient  Specialty: Interventional Pain Management  Patient type: Established  Location: [facility] (AMB) Pain Management Facility  Visit type: Interventional Procedure   Primary Reason for Visit: Interventional Pain Management Treatment. CC: Neck Pain  Procedure:          Anesthesia, Analgesia, Anxiolysis:  Type: Cervical Facet Medial Branch Block(s)  #2  Primary Purpose: Diagnostic Region: Posterolateral cervical spine Level: C3, C4, C5, C6, & C7 Medial Branch Level(s). Injecting these levels blocks the C3-4, C4-5, C5-6, and C6-7 cervical facet joints. Laterality: Bilateral  Type: Moderate (Conscious) Sedation combined with Local Anesthesia Indication(s): Analgesia and Anxiety Route: Intravenous (IV) IV Access: Secured Sedation: Meaningful verbal contact was maintained at all times during the procedure  Local Anesthetic: Lidocaine 1-2%   Position: Prone with head of the table raised to facilitate breathing.   Indications: 1. Spondylosis without myelopathy or radiculopathy, cervical region   2. Cervical facet syndrome (Bilateral)   3. Cervicalgia    Pain Score: Pre-procedure: 5 /10 Post-procedure: 1 /10  Pre-op Assessment:  Caleb [name] is a 62 y.o. (year old), male patient, seen today for interventional treatment. He  has a past surgical history that includes Cardiac catheterization (2015); Lumbar laminectomy (1989, 1999); Cervical fusion (1988, 1998); Liver surgery (2015); Partial colectomy (1990); Inguinal hernia repair (Bilateral, 1975); Lithotripsy; and Colonoscopy with propofol (N/A, 02/24/2017). Caleb [name] has a current medication list which includes the following prescription(s): albuterol, aspirin ec, cyclobenzaprine, fluticasone, gabapentin,  hydrochlorothiazide, isosorbide mononitrate, losartan, metoprolol tartrate, nitroglycerin, rosuvastatin, secukinumab, tiotropium, tramadol, and triamcinolone ointment, and the following Facility-Administered Medications: fentanyl, lactated ringers, and midazolam. His primarily concern today is the Neck Pain  Initial Vital Signs:  Pulse/HCG Rate: 64ECG Heart Rate: 64 Temp: 97.7 F (36.5 C) Resp: 18 BP: 127/84 SpO2: 100 %  BMI: Estimated body mass index is 30.41 kg/m as calculated from the following:   Height as of this encounter: 5\' 8"  (1.727 m).   Weight as of this encounter: 200 lb (90.7 kg).  Risk Assessment: Allergies: Reviewed. He is allergic to lisinopril.  Allergy Precautions: None required Coagulopathies: Reviewed. None identified.  Blood-thinner therapy: None at this time Active Infection(s): Reviewed. None identified. Caleb [name] is afebrile  Site Confirmation: Caleb [name] was asked to confirm the procedure and laterality before marking the site Procedure checklist: Completed Consent: Before the procedure and under the influence of no sedative(s), amnesic(s), or anxiolytics, the patient was informed of the treatment options, risks and possible complications. To fulfill our ethical and legal obligations, as recommended by the American Medical Association's Code of Ethics, I have informed the patient of my clinical impression; the nature and purpose of the treatment or procedure; the risks, benefits, and possible complications of the intervention; the alternatives, including doing nothing; the risk(s) and benefit(s) of the alternative treatment(s) or procedure(s); and the risk(s) and benefit(s) of doing nothing. The patient was provided information about the general risks and possible complications associated with the procedure. These may include, but are not limited to: failure to achieve desired goals, infection, bleeding, organ or nerve damage, allergic reactions, paralysis, and  death. In addition, the patient was informed of those risks and complications associated to Spine-related procedures, such as failure to decrease pain; infection (i.e.: Meningitis, epidural or intraspinal abscess); bleeding (i.e.: epidural hematoma, subarachnoid  hemorrhage, or any other type of intraspinal or peri-dural bleeding); organ or nerve damage (i.e.: Any type of peripheral nerve, nerve root, or spinal cord injury) with subsequent damage to sensory, motor, and/or autonomic systems, resulting in permanent pain, numbness, and/or weakness of one or several areas of the body; allergic reactions; (i.e.: anaphylactic reaction); and/or death. Furthermore, the patient was informed of those risks and complications associated with the medications. These include, but are not limited to: allergic reactions (i.e.: anaphylactic or anaphylactoid reaction(s)); adrenal axis suppression; blood sugar elevation that in diabetics may result in ketoacidosis or comma; water retention that in patients with history of congestive heart failure may result in shortness of breath, pulmonary edema, and decompensation with resultant heart failure; weight gain; swelling or edema; medication-induced neural toxicity; particulate matter embolism and blood vessel occlusion with resultant organ, and/or nervous system infarction; and/or aseptic necrosis of one or more joints. Finally, the patient was informed that Medicine is not an exact science; therefore, there is also the possibility of unforeseen or unpredictable risks and/or possible complications that may result in a catastrophic outcome. The patient indicated having understood very clearly. We have given the patient no guarantees and we have made no promises. Enough time was given to the patient to ask questions, all of which were answered to the patient's satisfaction. Caleb [name] has indicated that he wanted to continue with the procedure. Attestation: I, the ordering provider,  attest that I have discussed with the patient the benefits, risks, side-effects, alternatives, likelihood of achieving goals, and potential problems during recovery for the procedure that I have provided informed consent. Date  Time: 09/22/2017  9:44 AM  Pre-Procedure Preparation:  Monitoring: As per clinic protocol. Respiration, ETCO2, SpO2, BP, heart rate and rhythm monitor placed and checked for adequate function Safety Precautions: Patient was assessed for positional comfort and pressure points before starting the procedure. Time-out: I initiated and conducted the "Time-out" before starting the procedure, as per protocol. The patient was asked to participate by confirming the accuracy of the "Time Out" information. Verification of the correct person, site, and procedure were performed and confirmed by me, the nursing staff, and the patient. "Time-out" conducted as per Joint Commission's Universal Protocol (UP.01.01.01). Time: 1013  Description of Procedure:          Laterality: Bilateral. The procedure was performed in identical fashion on both sides. Level: C3, C4, C5, C6, & C7 Medial Branch Level(s). Area Prepped: Posterior Cervico-thoracic Region Prepping solution: ChloraPrep (2% chlorhexidine gluconate and 70% isopropyl alcohol) Safety Precautions: Aspiration looking for blood return was conducted prior to all injections. At no point did we inject any substances, as a needle was being advanced. Before injecting, the patient was told to immediately notify me if he was experiencing any new onset of "ringing in the ears, or metallic taste in the mouth". No attempts were made at seeking any paresthesias. Safe injection practices and needle disposal techniques used. Medications properly checked for expiration dates. SDV (single dose vial) medications used. After the completion of the procedure, all disposable equipment used was discarded in the proper designated medical waste containers. Local  Anesthesia: Protocol guidelines were followed. The patient was positioned over the fluoroscopy table. The area was prepped in the usual manner. The time-out was completed. The target area was identified using fluoroscopy. A 12-in long, straight, sterile hemostat was used with fluoroscopic guidance to locate the targets for each level blocked. Once located, the skin was marked with an approved surgical skin marker. Once  all sites were marked, the skin (epidermis, dermis, and hypodermis), as well as deeper tissues (fat, connective tissue and muscle) were infiltrated with a small amount of a short-acting local anesthetic, loaded on a 10cc syringe with a 25G, 1.5-in  Needle. An appropriate amount of time was allowed for local anesthetics to take effect before proceeding to the next step. Local Anesthetic: Lidocaine 2.0% The unused portion of the local anesthetic was discarded in the proper designated containers. Technical explanation of process:  C3 Medial Branch Nerve Block (MBB): The target area for the C3 dorsal medial articular branch is the lateral concave waist of the articular pillar of C3. Under fluoroscopic guidance, a Quincke needle was inserted until contact was made with os over the postero-lateral aspect of the articular pillar of C3 (target area). After negative aspiration for blood, 0.5 mL of the nerve block solution was injected without difficulty or complication. The needle was removed intact. C4 Medial Branch Nerve Block (MBB): The target area for the C4 dorsal medial articular branch is the lateral concave waist of the articular pillar of C4. Under fluoroscopic guidance, a Quincke needle was inserted until contact was made with os over the postero-lateral aspect of the articular pillar of C4 (target area). After negative aspiration for blood, 0.5 mL of the nerve block solution was injected without difficulty or complication. The needle was removed intact. C5 Medial Branch Nerve Block (MBB): The  target area for the C5 dorsal medial articular branch is the lateral concave waist of the articular pillar of C5. Under fluoroscopic guidance, a Quincke needle was inserted until contact was made with os over the postero-lateral aspect of the articular pillar of C5 (target area). After negative aspiration for blood, 0.5 mL of the nerve block solution was injected without difficulty or complication. The needle was removed intact. C6 Medial Branch Nerve Block (MBB): The target area for the C6 dorsal medial articular branch is the lateral concave waist of the articular pillar of C6. Under fluoroscopic guidance, a Quincke needle was inserted until contact was made with os over the postero-lateral aspect of the articular pillar of C6 (target area). After negative aspiration for blood, 0.5 mL of the nerve block solution was injected without difficulty or complication. The needle was removed intact. C7 Medial Branch Nerve Block (MBB): The target for the C7 dorsal medial articular branch lies on the superior-medial tip of the C7 transverse process. Under fluoroscopic guidance, a Quincke needle was inserted until contact was made with os over the postero-lateral aspect of the articular pillar of C7 (target area). After negative aspiration for blood, 0.5 mL of the nerve block solution was injected without difficulty or complication. The needle was removed intact. Procedural Needles: 22-gauge, 3.5-inch, Quincke needles used for all levels. Nerve block solution: 0.2% PF-Ropivacaine + Triamcinolone (40 mg/mL) diluted to a final concentration of 4 mg of Triamcinolone/mL of Ropivacaine The unused portion of the solution was discarded in the proper designated containers.  Once the entire procedure was completed, the treated area was cleaned, making sure to leave some of the prepping solution back to take advantage of its long term bactericidal properties.  Vitals:   09/22/17 1030 09/22/17 1040 09/22/17 1050 09/22/17 1059    BP: (!) 122/92 109/77 95/78 (!) 113/92  Pulse: 64     Resp: 18 19 12 16   Temp:  97.6 F (36.4 C)  (!) 97.2 F (36.2 C)  SpO2: 99% 98% 100% 100%  Weight:      Height:  Start Time: 1013 hrs. End Time: 1030 hrs.  Imaging Guidance (Spinal):          Type of Imaging Technique: Fluoroscopy Guidance (Spinal) Indication(s): Assistance in needle guidance and placement for procedures requiring needle placement in or near specific anatomical locations not easily accessible without such assistance. Exposure Time: Please see nurses notes. Contrast: None used. Fluoroscopic Guidance: I was personally present during the use of fluoroscopy. "Tunnel Vision Technique" used to obtain the best possible view of the target area. Parallax error corrected before commencing the procedure. "Direction-depth-direction" technique used to introduce the needle under continuous pulsed fluoroscopy. Once target was reached, antero-posterior, oblique, and lateral fluoroscopic projection used confirm needle placement in all planes. Images permanently stored in EMR. Interpretation: No contrast injected. I personally interpreted the imaging intraoperatively. Adequate needle placement confirmed in multiple planes. Permanent images saved into the patient's record.  Antibiotic Prophylaxis:   Anti-infectives (From admission, onward)   None     Indication(s): None identified  Post-operative Assessment:  Post-procedure Vital Signs:  Pulse/HCG Rate: 6463 Temp: (!) 97.2 F (36.2 C) Resp: 16 BP: (!) 113/92 SpO2: 100 %  EBL: None  Complications: No immediate post-treatment complications observed by team, or reported by patient.  Note: The patient tolerated the entire procedure well. A repeat set of vitals were taken after the procedure and the patient was kept under observation following institutional policy, for this type of procedure. Post-procedural neurological assessment was performed, showing return to  baseline, prior to discharge. The patient was provided with post-procedure discharge instructions, including a section on how to identify potential problems. Should any problems arise concerning this procedure, the patient was given instructions to immediately contact us, at any time, without hesitation. In any case, we plan to contact the patient by telephone for a follow-up status report regarding this interventional procedure.  Comments:  No additional relevant information.  Plan of Care    Imaging Orders     DG C-Arm 1-60 Min-No Report  Procedure Orders     CERVICAL FACET (MEDIAL BRANCH NERVE BLOCK)   Medications ordered for procedure: Meds ordered this encounter  Medications  . lidocaine (XYLOCAINE) 2 % (with pres) injection 400 mg  . midazolam (VERSED) 5 MG/5ML injection 1-2 mg    Make sure Flumazenil is available in the pyxis when using this medication. If oversedation occurs, administer 0.2 mg IV over 15 sec. If after 45 sec no response, administer 0.2 mg again over 1 min; may repeat at 1 min intervals; not to exceed 4 doses (1 mg)  . fentaNYL (SUBLIMAZE) injection 25-50 mcg    Make sure Narcan is available in the pyxis when using this medication. In the event of respiratory depression (RR< 8/min): Titrate NARCAN (naloxone) in increments of 0.1 to 0.2 mg IV at 2-3 minute intervals, until desired degree of reversal.  . lactated ringers infusion 1,000 mL  . ropivacaine (PF) 2 mg/mL (0.2%) (NAROPIN) injection 18 mL  . dexamethasone (DECADRON) injection 20 mg   Medications administered: We administered lidocaine, midazolam, fentaNYL, lactated ringers, ropivacaine (PF) 2 mg/mL (0.2%), and dexamethasone.  See the medical record for exact dosing, route, and time of administration.  New Prescriptions   No medications on file   Disposition: Discharge home  Discharge Date & Time: 09/22/2017; 1101 hrs.   Physician-requested Follow-up: Return for post-procedure eval (2 wks), w/ Dr.  Dossie Arbour.  Future Appointments  Date Time Provider [place]  10/04/2017  9:15 AM Milinda Pointer, MD [facility] None  12/14/2017 10:00  AM Virginia Crews, MD [facility] None  12/19/2017  9:00 AM Vevelyn Francois, NP [facility] None   Primary Care Physician: Virginia Crews, MD Location: [facility]patient [facility] Facility Note by: Gaspar Cola, MD Date: 09/22/2017; Time: 11:06 AM  Disclaimer:  Medicine is not an Chief Strategy Officer. The only guarantee in medicine is that nothing is guaranteed. It is important to note that the decision to proceed with this intervention was based on the information collected from the patient. The Data and conclusions were drawn from the patient's questionnaire, the interview, and the physical examination. Because the information was provided in large part by the patient, it cannot be guaranteed that it has not been purposely or unconsciously manipulated. Every effort has been made to obtain as much relevant data as possible for this evaluation. It is important to note that the conclusions that lead to this procedure are derived in large part from the available data. Always take into account that the treatment will also be dependent on availability of resources and existing treatment guidelines, considered by other Pain Management Practitioners as being common knowledge and practice, at the time of the intervention. For Medico-Legal purposes, it is also important to point out that variation in procedural techniques and pharmacological choices are the acceptable norm. The indications, contraindications, technique, and results of the above procedure should only be interpreted and judged by a Board-Certified Interventional Pain Specialist with extensive familiarity and expertise in the same exact procedure and technique.

## 2017-09-22 NOTE — Patient Instructions (Signed)

## 2017-09-22 NOTE — Progress Notes (Signed)
Safety precautions to be maintained throughout the outpatient stay will include: orient to surroundings, keep bed in low position, maintain call bell within reach at all times, provide assistance with transfer out of bed and ambulation.  

## 2017-09-23 ENCOUNTER — Telehealth: Payer: Self-pay

## 2017-09-23 NOTE — Telephone Encounter (Signed)
Post procedure phone call.  Patient states he is doing great.  

## 2017-09-28 ENCOUNTER — Encounter: Payer: Medicaid Other | Admitting: Nurse Practitioner

## 2017-10-04 ENCOUNTER — Ambulatory Visit
Admission: RE | Admit: 2017-10-04 | Discharge: 2017-10-04 | Disposition: A | Discharge status: Home / Self Care | Payer: Medicaid Other | Source: Ambulatory Visit | Attending: Pain Medicine | Admitting: Pain Medicine

## 2017-10-04 ENCOUNTER — Other Ambulatory Visit: Payer: Self-pay

## 2017-10-04 ENCOUNTER — Encounter: Payer: Self-pay | Admitting: Pain Medicine

## 2017-10-04 ENCOUNTER — Ambulatory Visit: Payer: Medicaid Other | Admitting: Pain Medicine

## 2017-10-04 VITALS — BP 115/79 | HR 72 | Temp 97.8°F | Resp 11 | Ht 68.0 in | Wt 200.0 lb

## 2017-10-04 DIAGNOSIS — Z79899 Other long term (current) drug therapy: Secondary | ICD-10-CM | POA: Diagnosis not present

## 2017-10-04 DIAGNOSIS — Z888 Allergy status to other drugs, medicaments and biological substances status: Secondary | ICD-10-CM | POA: Insufficient documentation

## 2017-10-04 DIAGNOSIS — G8929 Other chronic pain: Secondary | ICD-10-CM | POA: Diagnosis not present

## 2017-10-04 DIAGNOSIS — M79671 Pain in right foot: Secondary | ICD-10-CM | POA: Insufficient documentation

## 2017-10-04 DIAGNOSIS — Z7982 Long term (current) use of aspirin: Secondary | ICD-10-CM | POA: Insufficient documentation

## 2017-10-04 DIAGNOSIS — G5731 Lesion of lateral popliteal nerve, right lower limb: Secondary | ICD-10-CM | POA: Diagnosis not present

## 2017-10-04 DIAGNOSIS — M25571 Pain in right ankle and joints of right foot: Secondary | ICD-10-CM | POA: Diagnosis not present

## 2017-10-04 DIAGNOSIS — Z9049 Acquired absence of other specified parts of digestive tract: Secondary | ICD-10-CM | POA: Insufficient documentation

## 2017-10-04 MED ORDER — LACTATED RINGERS IV SOLN
1000.0000 mL | Freq: Once | INTRAVENOUS | Status: DC
  Administered 2017-10-04: 1000 mL via INTRAVENOUS

## 2017-10-04 MED ORDER — MIDAZOLAM HCL 5 MG/5ML IJ SOLN
1.0000 mg | INTRAMUSCULAR | Status: DC | PRN
  Filled 2017-10-04: qty 5

## 2017-10-04 MED ORDER — MIDAZOLAM HCL 5 MG/5ML IJ SOLN
1.0000 mg | INTRAMUSCULAR | Status: AC | PRN
  Administered 2017-10-04: 3 mg via INTRAVENOUS

## 2017-10-04 MED ORDER — ROPIVACAINE HCL 2 MG/ML IJ SOLN: 9.0000 mL | Freq: Once | INTRAMUSCULAR | Status: DC

## 2017-10-04 MED ORDER — METHYLPREDNISOLONE ACETATE 80 MG/ML IJ SUSP
80.0000 mg | Freq: Once | INTRAMUSCULAR | Status: AC
  Administered 2017-10-04: 80 mg via INTRA_ARTICULAR

## 2017-10-04 MED ORDER — METHYLPREDNISOLONE ACETATE 80 MG/ML IJ SUSP
80.0000 mg | Freq: Once | INTRAMUSCULAR | Status: DC
  Administered 2017-10-04: 80 mg via INTRA_ARTICULAR
  Filled 2017-10-04: qty 1

## 2017-10-04 MED ORDER — LIDOCAINE HCL 2 % IJ SOLN
20.0000 mL | Freq: Once | INTRAMUSCULAR | Status: AC
  Administered 2017-10-04: 400 mg

## 2017-10-04 MED ORDER — ROPIVACAINE HCL 2 MG/ML IJ SOLN
9.0000 mL | Freq: Once | INTRAMUSCULAR | Status: DC
  Filled 2017-10-04: qty 10

## 2017-10-04 MED ORDER — LIDOCAINE HCL 2 % IJ SOLN
20.0000 mL | Freq: Once | INTRAMUSCULAR | Status: DC
  Administered 2017-10-04: 400 mg
  Filled 2017-10-04: qty 40

## 2017-10-04 MED ORDER — FENTANYL CITRATE (PF) 100 MCG/2ML IJ SOLN
25.0000 ug | INTRAMUSCULAR | Status: AC | PRN
  Administered 2017-10-04: 100 ug via INTRAVENOUS

## 2017-10-04 MED ORDER — LACTATED RINGERS IV SOLN
1000.0000 mL | Freq: Once | INTRAVENOUS | Status: AC
  Administered 2017-10-04: 1000 mL via INTRAVENOUS

## 2017-10-04 MED ORDER — FENTANYL CITRATE (PF) 100 MCG/2ML IJ SOLN
25.0000 ug | INTRAMUSCULAR | Status: DC | PRN
  Filled 2017-10-04: qty 2

## 2017-10-04 NOTE — Patient Instructions (Signed)

## 2017-10-04 NOTE — Addendum Note (Signed)
Addended by: Milinda Pointer A on: 10/04/2017 10:03 AM   Modules accepted: Orders

## 2017-10-04 NOTE — Addendum Note (Signed)
Addended by: Dewayne Shorter on: 10/04/2017 09:29 AM   Modules accepted: Orders

## 2017-10-04 NOTE — Progress Notes (Addendum)
Patient's Name: Caleb [name]  MRN: [id number]  Referring Provider: Virginia Crews, MD  DOB: 1955-09-28  PCP: Virginia Crews, MD  DOS: 10/04/2017  Note by: Gaspar Cola, MD  Service setting: Ambulatory outpatient  Specialty: Interventional Pain Management  Patient type: Established  Location: [facility] (AMB) Pain Management Facility  Visit type: Interventional Procedure   Primary Reason for Visit: Interventional Pain Management Treatment. CC: Foot Pain (right)  Procedure:          Anesthesia, Analgesia, Anxiolysis:  Type: Diagnostic Superficial Peroneal [Fibular] &  SaphenousNerve Block #1  Region: Distal, anterolateral lower extremity & medial aspect of distal femoral condyle       Level: Distal lower extremity. Laterality: Right-Side  Type: Moderate (Conscious) Sedation combined with Local Anesthesia Indication(s): Analgesia and Anxiety Local Anesthetic: Lidocaine 1-2% Route: Intravenous (IV) IV Access: Secured Sedation: Meaningful verbal contact was maintained at all times during the procedure   Position: Supine   Indications: 1. Chronic foot pain (Primary Area of Pain) (Right)   2. Chronic ankle pain (Secondary Area of Pain) (Right)   3. Disorder of superficial peroneal nerve (Right)    Pain Score: Pre-procedure: 4 /10 Post-procedure: 0-No pain/10  Pre-op Assessment:  Mr. Knack is a 62 y.o. (year old), male patient, seen today for interventional treatment. He  has a past surgical history that includes Cardiac catheterization (2015); Lumbar laminectomy (1989, 1999); Cervical fusion (1988, 1998); Liver surgery (2015); Partial colectomy (1990); Inguinal hernia repair (Bilateral, 1975); Lithotripsy; and Colonoscopy with propofol (N/A, 02/24/2017). Mr. Urbas has a current medication list which includes the following prescription(s): albuterol, aspirin ec, cyclobenzaprine, fluticasone, gabapentin, hydrochlorothiazide, isosorbide mononitrate, losartan, metoprolol  tartrate, nitroglycerin, rosuvastatin, secukinumab, tiotropium, tramadol, and triamcinolone ointment, and the following Facility-Administered Medications: lactated ringers, ropivacaine (pf) 2 mg/ml (0.2%), and lactated ringers. His primarily concern today is the Foot Pain (right)  Initial Vital Signs:  Pulse/HCG Rate: 74ECG Heart Rate: 67 Temp: 98.3 F (36.8 C) Resp: 16 BP: (!) 125/95 SpO2: 99 %  BMI: Estimated body mass index is 30.41 kg/m as calculated from the following:   Height as of this encounter: 5\' 8"  (1.727 m).   Weight as of this encounter: 200 lb (90.7 kg).  Risk Assessment: Allergies: Reviewed. He is allergic to lisinopril.  Allergy Precautions: None required Coagulopathies: Reviewed. None identified.  Blood-thinner therapy: None at this time Active Infection(s): Reviewed. None identified. Mr. Geurts is afebrile  Site Confirmation: Mr. Mcgaugh was asked to confirm the procedure and laterality before marking the site Procedure checklist: Completed Consent: Before the procedure and under the influence of no sedative(s), amnesic(s), or anxiolytics, the patient was informed of the treatment options, risks and possible complications. To fulfill our ethical and legal obligations, as recommended by the American Medical Association's Code of Ethics, I have informed the patient of my clinical impression; the nature and purpose of the treatment or procedure; the risks, benefits, and possible complications of the intervention; the alternatives, including doing nothing; the risk(s) and benefit(s) of the alternative treatment(s) or procedure(s); and the risk(s) and benefit(s) of doing nothing. The patient was provided information about the general risks and possible complications associated with the procedure. These may include, but are not limited to: failure to achieve desired goals, infection, bleeding, organ or nerve damage, allergic reactions, paralysis, and death. In addition, the  patient was informed of those risks and complications associated to the procedure, such as failure to decrease pain; infection; bleeding; organ or nerve damage with subsequent damage to  sensory, motor, and/or autonomic systems, resulting in permanent pain, numbness, and/or weakness of one or several areas of the body; allergic reactions; (i.e.: anaphylactic reaction); and/or death. Furthermore, the patient was informed of those risks and complications associated with the medications. These include, but are not limited to: allergic reactions (i.e.: anaphylactic or anaphylactoid reaction(s)); adrenal axis suppression; blood sugar elevation that in diabetics may result in ketoacidosis or comma; water retention that in patients with history of congestive heart failure may result in shortness of breath, pulmonary edema, and decompensation with resultant heart failure; weight gain; swelling or edema; medication-induced neural toxicity; particulate matter embolism and blood vessel occlusion with resultant organ, and/or nervous system infarction; and/or aseptic necrosis of one or more joints. Finally, the patient was informed that Medicine is not an exact science; therefore, there is also the possibility of unforeseen or unpredictable risks and/or possible complications that may result in a catastrophic outcome. The patient indicated having understood very clearly. We have given the patient no guarantees and we have made no promises. Enough time was given to the patient to ask questions, all of which were answered to the patient's satisfaction. Mr. Crocket has indicated that he wanted to continue with the procedure. Attestation: I, the ordering provider, attest that I have discussed with the patient the benefits, risks, side-effects, alternatives, likelihood of achieving goals, and potential problems during recovery for the procedure that I have provided informed consent. Date  Time: 10/04/2017  9:05 AM  Pre-Procedure  Preparation:  Monitoring: As per clinic protocol. Respiration, ETCO2, SpO2, BP, heart rate and rhythm monitor placed and checked for adequate function Safety Precautions: Patient was assessed for positional comfort and pressure points before starting the procedure. Time-out: I initiated and conducted the "Time-out" before starting the procedure, as per protocol. The patient was asked to participate by confirming the accuracy of the "Time Out" information. Verification of the correct person, site, and procedure were performed and confirmed by me, the nursing staff, and the patient. "Time-out" conducted as per Joint Commission's Universal Protocol (UP.01.01.01). Time: 0947  Description of Procedure:          Target Area: Space between tibial medial malleoluslunate and Talus Approach: Dorsal approach. Area Prepped: Entire ankle Region Prepping solution: ChloraPrep (2% chlorhexidine gluconate and 70% isopropyl alcohol) Safety Precautions: Aspiration looking for blood return was conducted prior to all injections. At no point did we inject any substances, as a needle was being advanced. No attempts were made at seeking any paresthesias. Safe injection practices and needle disposal techniques used. Medications properly checked for expiration dates. SDV (single dose vial) medications used. Description of the Procedure: Protocol guidelines were followed. The patient was placed in position. The target area was identified and the area prepped in the usual manner. Skin & deeper tissues infiltrated with local anesthetic. Appropriate amount of time allowed to pass for local anesthetics to take effect. The procedure needles were then advanced to the target area. Proper needle placement secured. Negative aspiration confirmed. Solution injected in intermittent fashion, asking for systemic symptoms every 0.5cc of injectate. The needles were then removed and the area cleansed, making sure to leave some of the prepping  solution back to take advantage of its long term bactericidal properties.       Vitals:   10/04/17 0954 10/04/17 1004 10/04/17 1014 10/04/17 1024  BP: 126/88 113/81 107/74 115/79  Pulse:  72 73 72  Resp: 12 18 20 11   Temp:  98.1 F (36.7 C)  97.8 F (36.6  C)  TempSrc:  Tympanic    SpO2: 93% 95% 97% 97%  Weight:      Height:        Start Time: 0947 hrs. End Time: 0953 hrs.  Materials:  Needle(s) Type: Spinal Needle Gauge: 25G Length: 1.5-in Medication(s): Please see orders for medications and dosing details.  Imaging Guidance (Non-Spinal):          Type of Imaging Technique: Fluoroscopy Guidance (Non-Spinal) Indication(s): Assistance in needle guidance and placement for procedures requiring needle placement in or near specific anatomical locations not easily accessible without such assistance. Exposure Time: Please see nurses notes. Contrast: None used. Fluoroscopic Guidance: I was personally present during the use of fluoroscopy. "Tunnel Vision Technique" used to obtain the best possible view of the target area. Parallax error corrected before commencing the procedure. "Direction-depth-direction" technique used to introduce the needle under continuous pulsed fluoroscopy. Once target was reached, antero-posterior, oblique, and lateral fluoroscopic projection used confirm needle placement in all planes. Images permanently stored in EMR. Interpretation: No contrast injected. I personally interpreted the imaging intraoperatively. Adequate needle placement confirmed in multiple planes. Permanent images saved into the patient's record.  Antibiotic Prophylaxis:   Anti-infectives (From admission, onward)   None     Indication(s): None identified  Post-operative Assessment:  Post-procedure Vital Signs:  Pulse/HCG Rate: 7262 Temp: 97.8 F (36.6 C) Resp: 11 BP: 115/79 SpO2: 97 %  EBL: None  Complications: No immediate post-treatment complications observed by team, or  reported by patient.  Note: The patient tolerated the entire procedure well. A repeat set of vitals were taken after the procedure and the patient was kept under observation following institutional policy, for this type of procedure. Post-procedural neurological assessment was performed, showing return to baseline, prior to discharge. The patient was provided with post-procedure discharge instructions, including a section on how to identify potential problems. Should any problems arise concerning this procedure, the patient was given instructions to immediately contact us, at any time, without hesitation. In any case, we plan to contact the patient by telephone for a follow-up status report regarding this interventional procedure.  Comments:  No additional relevant information.  Plan of Care    Imaging Orders     DG C-Arm 1-60 Min-No Report  Procedure Orders     NERVE BLOCK  Medications ordered for procedure: Meds ordered this encounter  Medications  . fentaNYL (SUBLIMAZE) injection 25-50 mcg    Make sure Narcan is available in the pyxis when using this medication. In the event of respiratory depression (RR< 8/min): Titrate NARCAN (naloxone) in increments of 0.1 to 0.2 mg IV at 2-3 minute intervals, until desired degree of reversal.  . lactated ringers infusion 1,000 mL  . lidocaine (XYLOCAINE) 2 % (with pres) injection 400 mg  . methylPREDNISolone acetate (DEPO-MEDROL) injection 80 mg  . midazolam (VERSED) 5 MG/5ML injection 1-2 mg    Make sure Flumazenil is available in the pyxis when using this medication. If oversedation occurs, administer 0.2 mg IV over 15 sec. If after 45 sec no response, administer 0.2 mg again over 1 min; may repeat at 1 min intervals; not to exceed 4 doses (1 mg)  . ropivacaine (PF) 2 mg/mL (0.2%) (NAROPIN) injection 9 mL   Medications administered: We administered lidocaine, lactated ringers, methylPREDNISolone acetate, fentaNYL, lactated ringers, lidocaine,  methylPREDNISolone acetate, and midazolam.  See the medical record for exact dosing, route, and time of administration.  New Prescriptions   No medications on file   Disposition: Discharge home  Discharge Date & Time: 10/04/2017; 1028 hrs.   Physician-requested Follow-up: Return for post-procedure eval (2 wks), w/ Dr. Dossie Arbour.  Future Appointments  Date Time Provider [place]  10/19/2017  1:30 PM Milinda Pointer, MD [facility] None  12/14/2017 10:00 AM Brita Romp Dionne Bucy, MD [facility] None  12/19/2017  9:00 AM Vevelyn Francois, NP [facility] None   Primary Care Physician: Virginia Crews, MD Location: [facility] Note by: Gaspar Cola, MD Date: 10/04/2017; Time: 10:37 AM  Disclaimer:  Medicine is not an exact science. The only guarantee in medicine is that nothing is guaranteed. It is important to note that the decision to proceed with this intervention was based on the information collected from the patient. The Data and conclusions were drawn from the patient's questionnaire, the interview, and the physical examination. Because the information was provided in large part by the patient, it cannot be guaranteed that it has not been purposely or unconsciously manipulated. Every effort has been made to obtain as much relevant data as possible for this evaluation. It is important to note that the conclusions that lead to this procedure are derived in large part from the available data. Always take into account that the treatment will also be dependent on availability of resources and existing treatment guidelines, considered by other Pain Management Practitioners as being common knowledge and practice, at the time of the intervention. For Medico-Legal purposes, it is also important to point out that variation in procedural techniques and pharmacological choices are the acceptable norm. The indications, contraindications, technique, and results of  the above procedure should only be interpreted and judged by a Board-Certified Interventional Pain Specialist with extensive familiarity and expertise in the same exact procedure and technique.

## 2017-10-04 NOTE — Progress Notes (Signed)
Safety precautions to be maintained throughout the outpatient stay will include: orient to surroundings, keep bed in low position, maintain call bell within reach at all times, provide assistance with transfer out of bed and ambulation.  

## 2017-10-05 ENCOUNTER — Telehealth: Payer: Self-pay | Admitting: *Deleted

## 2017-10-05 NOTE — Telephone Encounter (Signed)
Attempted to call for post procedure follow-up. Message left. 

## 2017-10-06 DIAGNOSIS — G8929 Other chronic pain: Secondary | ICD-10-CM | POA: Diagnosis not present

## 2017-10-06 DIAGNOSIS — M542 Cervicalgia: Secondary | ICD-10-CM | POA: Diagnosis not present

## 2017-10-06 DIAGNOSIS — M546 Pain in thoracic spine: Secondary | ICD-10-CM | POA: Diagnosis not present

## 2017-10-06 DIAGNOSIS — Z981 Arthrodesis status: Secondary | ICD-10-CM | POA: Diagnosis not present

## 2017-10-16 ENCOUNTER — Other Ambulatory Visit: Payer: Self-pay | Admitting: Family Medicine

## 2017-10-17 NOTE — Progress Notes (Signed)
Patient's Name: Caleb [name]  MRN: [id number]  Referring Provider: Virginia Crews, MD  DOB: 1955-06-29  PCP: Virginia Crews, MD  DOS: 10/19/2017  Note by: Gaspar Cola, MD  Service setting: Ambulatory outpatient  Specialty: Interventional Pain Management  Location: [facility] (AMB) Pain Management Facility    Patient type: Established   Primary Reason(s) for Visit: Encounter for post-procedure evaluation of chronic illness with mild to moderate exacerbation CC: Foot Pain  HPI  Caleb [name] is a 62 y.o. year old, male patient, who comes today for a post-procedure evaluation. He has OSA (obstructive sleep apnea); Myocardial infarction ([place]); Hypertension; Hyperlipidemia; COPD (chronic obstructive pulmonary disease) ([place]); Chronic foot pain (Primary Area of Pain) (Right); Tobacco abuse; Erectile dysfunction; CAD (coronary artery disease), native coronary artery; Chronic ankle pain (Secondary Area of Pain) (Right); Chronic thoracic back pain [facility] Area of Pain) (Midline); Chronic pain syndrome; Long term current use of opiate analgesic; Pharmacologic therapy; Disorder of skeletal system; Problems influencing health status; Elevated C-reactive protein (CRP); Elevated sed rate; 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); Spondylosis without myelopathy or radiculopathy, cervicothoracic region; Chronic musculoskeletal pain; Neurogenic foot pain (Right); DDD (degenerative disc disease), cervical; 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, 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); Cervicalgia (Bilateral) (L>R); DDD (degenerative disc disease), thoracic; Disorder of superficial peroneal nerve (Right); Chronic upper extremity pain (Left); Cervical spondylosis w/ radiculopathy; Cervical disc disorder with radiculopathy of cervical region; Chronic upper extremity weakness (Left); Abnormal nerve conduction studies (06/08/2017); 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 His primarily concern today is the Foot Pain  Pain Assessment: Location: Right Foot(right foot) Radiating: Denies Duration: Chronic pain Quality: Burning, Constant Severity: 5 /10 (subjective, self-reported pain score)  Note: Reported level is inconsistent with clinical observations. Clinically the patient looks like a 3/10 A 3/10 is viewed as "Moderate" and described as significantly interfering with activities of daily living (ADL). It becomes difficult to feed, bathe, get dressed, get on and off the toilet or to perform personal hygiene functions. Difficult to get in and out of bed or a chair without assistance. Very distracting. With effort, it can be ignored when deeply involved in activities. Caleb [name] continues to use a standard subjective pain scale, rather than an objective pain scale as instructed. When using our objective Pain Scale, levels between 6 and 10/10 are said to belong in an emergency room, as it progressively worsens from a 6/10, described as severely limiting, requiring emergency care not usually available at an outpatient pain management facility. At a 6/10 level, communication becomes difficult and requires great effort. Assistance to reach the emergency department may be required. Facial flushing and profuse sweating along with potentially dangerous increases in heart rate and blood pressure will be evident. Effect on ADL: limits my daily activities Timing: Constant Modifying factors: nothing BP: 114/75  HR: 75  Caleb [name] comes in today for post-procedure evaluation.  Further details on both, my assessment(s), as well as the proposed treatment plan, please see below.  Post-Procedure #1 Assessment  09/22/2017 Procedure: Diagnostic bilateral cervical facet block #2under fluoroscopic guidance and IV sedation Pre-procedure  pain score:  5/10 Post-procedure pain score: 1/10 (> 50% relief) Influential Factors: BMI: 31.17 kg/m Intra-procedural challenges: None observed.         Assessment challenges: None detected.              Reported  side-effects: None.        Post-procedural adverse reactions or complications: None reported             Sedation: Sedation provided. When no sedatives are used, the analgesic levels obtained are directly associated to the effectiveness of the local anesthetics. However, when sedation is provided, the level of analgesia obtained during the initial 1 hour following the intervention, is believed to be the result of a combination of factors. These factors may include, but are not limited to: 1. The effectiveness of the local anesthetics used. 2. The effects of the analgesic(s) and/or anxiolytic(s) used. 3. The degree of discomfort experienced by the patient at the time of the procedure. 4. The patients ability and reliability in recalling and recording the events. 5. The presence and influence of possible secondary gains and/or psychosocial factors. Reported result: Relief experienced during the 1st hour after the procedure:  100% (Ultra-Short Term Relief) Caleb [name] has indicated area to have been numb during this time. Interpretative annotation: Clinically appropriate result. Analgesia during this period is likely to be Local Anesthetic and/or IV Sedative (Analgesic/Anxiolytic) related.          Effects of local anesthetic: The analgesic effects attained during this period are directly associated to the localized infiltration of local anesthetics and therefore cary significant diagnostic value as to the etiological location, or anatomical origin, of the pain. Expected duration of relief is directly dependent on the pharmacodynamics of the local anesthetic used. Long-acting (4-6 hours) anesthetics used.  Reported result: Relief during the next 4 to 6 hour after the procedure:  100%  (Short-Term Relief) Caleb [name] has indicated area to have been numb during this time. Interpretative annotation: Clinically appropriate result. Analgesia during this period is likely to be Local Anesthetic-related.          Long-term benefit: Defined as the period of time past the expected duration of local anesthetics (1 hour for short-acting and 4-6 hours for long-acting). With the possible exception of prolonged sympathetic blockade from the local anesthetics, benefits during this period are typically attributed to, or associated with, other factors such as analgesic sensory neuropraxia, antiinflammatory effects, or beneficial biochemical changes provided by agents other than the local anesthetics.  Reported result: Extended relief following procedure:  100% (Long-Term Relief) This relief lasted approximately 4 days. Interpretative annotation: Clinically possible results. Good relief. No permanent benefit expected. Inflammation plays a part in the etiology to the pain.          Current benefits: Defined as reported results that persistent at this point in time.   Analgesia: >50 % Caleb [name] reports improvement of axial symptoms. Function: Somewhat improved ROM: Somewhat improved Interpretative annotation: Ongoing benefit. Incomplete therapeutic success. Results would suggest persistent aggravating factors.          Interpretation: Results would suggest a successful diagnostic intervention.                  Plan:  Proceed with Radiofrequency Ablation for the purpose of attaining long-term benefits.       "The patient has failed to respond to conservative therapies including over-the-counter medications, anti-inflammatories, muscle relaxants, membrane stabilizers, opioids, physical therapy modalities such as heat and ice, as well as more invasive techniques such as nerve blocks. Because Caleb [name] did attain more than 50% relief of the pain during a series of diagnostic blocks conducted in separate  occasions, I believe it is medically necessary to proceed with Radiofrequency Ablation, in order to attempt  gaining longer relief.  Medical Necessity: Caleb [name] has been dealing with the above chronic pain from the Spondylosis without myelopathy or radiculopathy, cervical region [M47.812] for longer than three months and has either failed to respond, was unable to tolerate, or simply did not get enough benefit from other more conservative therapies including, but not limited to: 1. Over-the-counter medications 2. Anti-inflammatory medications 3. Muscle relaxants 4. Membrane stabilizers 5. Opioids 6. Physical therapy 7. Modalities (Heat, ice, etc.) 8. Invasive techniques such as nerve blocks. Caleb [name] has attained more than 50% relief of the pain from a series of diagnostic injections conducted in separate occasions. For this reason, I believe it is medically necessary to proceed with Radiofrequency Ablation for the purpose of attempting to prolong the duration of the benefits seen with the diagnostic injections.  Post-Procedure #2 Assessment  10/04/2017 Procedure: Diagnostic right-sided Superficial Peroneal [Fibular] &  Saphenous Nerve Block #1  under fluoroscopic guidance and IV sedation Pre-procedure pain score:  4/10 Post-procedure pain score: 0/10 (100% relief) Influential Factors: BMI: 31.17 kg/m Intra-procedural challenges: None observed.         Assessment challenges: None detected.              Reported side-effects: None.        Post-procedural adverse reactions or complications: None reported            Superficial Peroneal NB          Saphenous NB  Sedation: Sedation provided. When no sedatives are used, the analgesic levels obtained are directly associated to the effectiveness of the local anesthetics. However, when sedation is provided, the level of analgesia obtained during the initial 1 hour following the intervention, is believed to be the result of a combination of  factors. These factors may include, but are not limited to: 1. The effectiveness of the local anesthetics used. 2. The effects of the analgesic(s) and/or anxiolytic(s) used. 3. The degree of discomfort experienced by the patient at the time of the procedure. 4. The patients ability and reliability in recalling and recording the events. 5. The presence and influence of possible secondary gains and/or psychosocial factors. Reported result: Relief experienced during the 1st hour after the procedure: 100 % (Ultra-Short Term Relief) Caleb [name] has indicated area to have been numb during this time. Interpretative annotation: Clinically appropriate result. Analgesia during this period is likely to be Local Anesthetic and/or IV Sedative (Analgesic/Anxiolytic) related.          Effects of local anesthetic: The analgesic effects attained during this period are directly associated to the localized infiltration of local anesthetics and therefore cary significant diagnostic value as to the etiological location, or anatomical origin, of the pain. Expected duration of relief is directly dependent on the pharmacodynamics of the local anesthetic used. Long-acting (4-6 hours) anesthetics used.  Reported result: Relief during the next 4 to 6 hour after the procedure: 100 % (Short-Term Relief) Caleb [name] has indicated area to have been numb during this time. Interpretative annotation: Clinically appropriate result. Analgesia during this period is likely to be Local Anesthetic-related.          Long-term benefit: Defined as the period of time past the expected duration of local anesthetics (1 hour for short-acting and 4-6 hours for long-acting). With the possible exception of prolonged sympathetic blockade from the local anesthetics, benefits during this period are typically attributed to, or associated with, other factors such as analgesic sensory neuropraxia, antiinflammatory effects, or beneficial biochemical changes  provided  by agents other than the local anesthetics.  Reported result: Extended relief following procedure: 100 % x 1 week (Long-Term Relief)            Interpretative annotation: Clinically possible results. Good relief. No permanent benefit expected. Inflammation plays a part in the etiology to the pain.          Current benefits: Defined as reported results that persistent at this point in time.   Analgesia: 0-25 %            Function: Back to baseline ROM: Back to baseline Interpretative annotation: Recurrence of symptoms. No permanent benefit expected. Effective diagnostic intervention.          Interpretation: Results would suggest a successful diagnostic intervention.                  Plan:  Please see "Plan of Care" for details.                Laboratory Chemistry  Inflammation Markers (CRP: Acute Phase) (ESR: Chronic Phase) Lab Results  Component Value Date   CRP 7.3 (H) 02/17/2017   ESRSEDRATE 38 (H) 02/17/2017                         Renal Markers Lab Results  Component Value Date   BUN 18 05/17/2017   CREATININE 0.78 05/17/2017   BCR 22 (H) 02/17/2017   GFRAA >60 05/17/2017   GFRNONAA >60 05/17/2017                             Hepatic Markers Lab Results  Component Value Date   AST 19 02/17/2017   ALT 13 10/21/2016   ALBUMIN 4.4 02/17/2017                        Neuropathy Markers Lab Results  Component Value Date   VITAMINB12 403 02/17/2017   HIV Non Reactive 02/04/2017                        Hematology Parameters Lab Results  Component Value Date   PLT 269 05/17/2017   HGB 16.8 05/17/2017   HCT 49.5 05/17/2017                        CV Markers Lab Results  Component Value Date   TROPONINI <0.03 05/17/2017                         Note: Lab results reviewed.  Recent Imaging Results   Results for orders placed in visit on 10/04/17  DG C-Arm 1-60 Min-No Report   Narrative Fluoroscopy was utilized by the requesting physician.  No  radiographic  interpretation.    Interpretation Report: Fluoroscopy was used during the procedure to assist with needle guidance. The images were interpreted intraoperatively by the requesting physician.  Meds   Current Outpatient Medications:  .  albuterol (PROVENTIL HFA;VENTOLIN HFA) 108 (90 Base) MCG/ACT inhaler, Inhale 1-2 puffs every 6 (six) hours as needed into the lungs for wheezing or shortness of breath., Disp: 1 Inhaler, Rfl: 5 .  aspirin EC 81 MG tablet, Take 81 mg by mouth daily., Disp: , Rfl:  .  fluticasone (FLOVENT HFA) 220 MCG/ACT inhaler, Inhale 1 puff 2 (two) times daily into the lungs., Disp: 1 Inhaler,  Rfl: 5 .  gabapentin (NEURONTIN) 300 MG capsule, Take 3 capsules (900 mg total) by mouth 4 (four) times daily., Disp: 360 capsule, Rfl: 2 .  hydrochlorothiazide (HYDRODIURIL) 25 MG tablet, TAKE 1 TABLET BY MOUTH EVERY DAY, Disp: 30 tablet, Rfl: 11 .  isosorbide mononitrate (IMDUR) 30 MG 24 hr tablet, TAKE 0.5 TABLET(S) EVERY DAY BY ORAL ROUTE IN THE MORNING., Disp: 15 tablet, Rfl: 5 .  losartan (COZAAR) 100 MG tablet, Take 1 tablet (100 mg total) by mouth daily., Disp: 30 tablet, Rfl: 5 .  metoprolol tartrate (LOPRESSOR) 25 MG tablet, TAKE 1 TABLET BY MOUTH TWICE A DAY, Disp: 60 tablet, Rfl: 2 .  nitroGLYCERIN (NITROSTAT) 0.4 MG SL tablet, Place 1 tablet (0.4 mg total) under the tongue every 5 (five) minutes as needed for chest pain., Disp: 30 tablet, Rfl: 3 .  rosuvastatin (CRESTOR) 40 MG tablet, TAKE 1 TABLET BY MOUTH EVERY DAY, Disp: 90 tablet, Rfl: 3 .  Secukinumab (COSENTYX, 300 MG DOSE, Victor), Inject 300 mg into the skin every 30 (thirty) days. , Disp: , Rfl:  .  tiotropium (SPIRIVA) 18 MCG inhalation capsule, Place 1 capsule (18 mcg total) daily into inhaler and inhale., Disp: 30 capsule, Rfl: 5 .  triamcinolone ointment (KENALOG) 0.5 %, Apply 1 application 2 (two) times daily topically. On psoriasis lesions, Disp: 30 g, Rfl: 2 .  methocarbamol (ROBAXIN) 750 MG tablet,  Take 1 tablet (750 mg total) by mouth every 8 (eight) hours as needed for muscle spasms., Disp: 90 tablet, Rfl: 0 .  oxyCODONE (OXY IR/ROXICODONE) 5 MG immediate release tablet, Take 1 tablet (5 mg total) by mouth every 8 (eight) hours as needed for severe pain. Must last 30 days., Disp: 90 tablet, Rfl: 0 .  pregabalin (LYRICA) 150 MG capsule, Take 1 capsule (150 mg total) by mouth 3 (three) times daily., Disp: 90 capsule, Rfl: 0  ROS  Constitutional: Denies any fever or chills Gastrointestinal: No reported hemesis, hematochezia, vomiting, or acute GI distress Musculoskeletal: Denies any acute onset joint swelling, redness, loss of ROM, or weakness Neurological: No reported episodes of acute onset apraxia, aphasia, dysarthria, agnosia, amnesia, paralysis, loss of coordination, or loss of consciousness  Allergies  Caleb [name] is allergic to lisinopril.  Caleb [name]  Drug: Caleb [name]  reports that he does not use drugs. Alcohol:  reports that he drinks about 4.0 standard drinks of alcohol per week. Tobacco:  reports that he has been smoking cigarettes. He has a 24.50 pack-year smoking history. He has never used smokeless tobacco. Medical:  has a past medical history of CHF (congestive heart failure) ([place]), Chronic foot pain, right (2015), COPD (chronic obstructive pulmonary disease) ([place]), Coronary artery disease, Hyperlipidemia, Hypertension, Myocardial infarction ([place]) (2015), and OSA on CPAP. Surgical: Caleb [name]  has a past surgical history that includes Cardiac catheterization (2015); Lumbar laminectomy (1989, 1999); Cervical fusion (1988, 1998); Liver surgery (2015); Partial colectomy (1990); Inguinal hernia repair (Bilateral, 1975); Lithotripsy; and Colonoscopy with propofol (N/A, 02/24/2017). Family: family history includes Alzheimer's disease in his paternal grandmother; Alzheimer's disease (age of onset: 37) in his father; Breast cancer in his maternal uncle; CAD in his mother; Diabetes in his  maternal grandmother; Healthy in his sister; Heart attack in his maternal uncle; Heart failure in his maternal grandmother; Heart failure (age of onset: 5) in his mother; Non-Hodgkin's lymphoma in his sister.  Constitutional Exam  General appearance: Well nourished, well developed, and well hydrated. In no apparent acute distress Vitals:   10/19/17 1354  BP: 114/75  Pulse: 75  Temp: 98.1 F (36.7 C)  SpO2: 95%  Weight: 205 lb (93 kg)  Height: '5\' 8"'$  (1.727 m)   BMI Assessment: Estimated body mass index is 31.17 kg/m as calculated from the following:   Height as of this encounter: '5\' 8"'$  (1.727 m).   Weight as of this encounter: 205 lb (93 kg).  BMI interpretation table: BMI level Category Range association with higher incidence of chronic pain  <18 kg/m2 Underweight   18.5-24.9 kg/m2 Ideal body weight   25-29.9 kg/m2 Overweight Increased incidence by 20%  30-34.9 kg/m2 Obese (Class I) Increased incidence by 68%  35-39.9 kg/m2 Severe obesity (Class II) Increased incidence by 136%  >40 kg/m2 Extreme obesity (Class III) Increased incidence by 254%   Patient's current BMI Ideal Body weight  Body mass index is 31.17 kg/m. Ideal body weight: 68.4 kg (150 lb 12.7 oz) Adjusted ideal body weight: 78.2 kg (172 lb 7.6 oz)   BMI Readings from Last 4 Encounters:  10/19/17 31.17 kg/m  10/04/17 30.41 kg/m  09/22/17 30.41 kg/m  09/21/17 30.41 kg/m   Wt Readings from Last 4 Encounters:  10/19/17 205 lb (93 kg)  10/04/17 200 lb (90.7 kg)  09/22/17 200 lb (90.7 kg)  09/21/17 200 lb (90.7 kg)  Psych/Mental status: Alert, oriented x 3 (person, place, & time)       Eyes: PERLA Respiratory: No evidence of acute respiratory distress  Cervical Spine Area Exam  Skin & Axial Inspection: No masses, redness, edema, swelling, or associated skin lesions Alignment: Symmetrical Functional ROM: Unrestricted ROM      Stability: No instability detected Muscle Tone/Strength: Functionally intact.  No obvious neuro-muscular anomalies detected. Sensory (Neurological): Unimpaired Palpation: No palpable anomalies              Upper Extremity (UE) Exam    Side: Right upper extremity  Side: Left upper extremity  Skin & Extremity Inspection: Skin color, temperature, and hair growth are WNL. No peripheral edema or cyanosis. No masses, redness, swelling, asymmetry, or associated skin lesions. No contractures.  Skin & Extremity Inspection: Skin color, temperature, and hair growth are WNL. No peripheral edema or cyanosis. No masses, redness, swelling, asymmetry, or associated skin lesions. No contractures.  Functional ROM: Unrestricted ROM          Functional ROM: Unrestricted ROM          Muscle Tone/Strength: Functionally intact. No obvious neuro-muscular anomalies detected.  Muscle Tone/Strength: Functionally intact. No obvious neuro-muscular anomalies detected.  Sensory (Neurological): Unimpaired          Sensory (Neurological): Unimpaired          Palpation: No palpable anomalies              Palpation: No palpable anomalies              Provocative Test(s):  Phalen's test: deferred Tinel's test: deferred Apley's scratch test (touch opposite shoulder):  Action 1 (Across chest): deferred Action 2 (Overhead): deferred Action 3 (LB reach): deferred   Provocative Test(s):  Phalen's test: deferred Tinel's test: deferred Apley's scratch test (touch opposite shoulder):  Action 1 (Across chest): deferred Action 2 (Overhead): deferred Action 3 (LB reach): deferred    Thoracic Spine Area Exam  Skin & Axial Inspection: No masses, redness, or swelling Alignment: Symmetrical Functional ROM: Unrestricted ROM Stability: No instability detected Muscle Tone/Strength: Functionally intact. No obvious neuro-muscular anomalies detected. Sensory (Neurological): Unimpaired Muscle strength & Tone: No palpable anomalies  Lumbar  Spine Area Exam  Skin & Axial Inspection: No masses, redness, or  swelling Alignment: Symmetrical Functional ROM: Unrestricted ROM       Stability: No instability detected Muscle Tone/Strength: Functionally intact. No obvious neuro-muscular anomalies detected. Sensory (Neurological): Unimpaired Palpation: No palpable anomalies       Provocative Tests: Hyperextension/rotation test: deferred today       Lumbar quadrant test (Kemp's test): deferred today       Lateral bending test: deferred today       Patrick's Maneuver: deferred today                   FABER test: deferred today                   S-I anterior distraction/compression test: deferred today         S-I lateral compression test: deferred today         S-I Thigh-thrust test: deferred today         S-I Gaenslen's test: deferred today          Gait & Posture Assessment  Ambulation: Unassisted Gait: Relatively normal for age and body habitus Posture: WNL   Lower Extremity Exam    Side: Right lower extremity  Side: Left lower extremity  Stability: No instability observed          Stability: No instability observed          Skin & Extremity Inspection: Skin color, temperature, and hair growth are WNL. No peripheral edema or cyanosis. No masses, redness, swelling, asymmetry, or associated skin lesions. No contractures.  Skin & Extremity Inspection: Skin color, temperature, and hair growth are WNL. No peripheral edema or cyanosis. No masses, redness, swelling, asymmetry, or associated skin lesions. No contractures.  Functional ROM: Unrestricted ROM                  Functional ROM: Unrestricted ROM                  Muscle Tone/Strength: Functionally intact. No obvious neuro-muscular anomalies detected.  Muscle Tone/Strength: Functionally intact. No obvious neuro-muscular anomalies detected.  Sensory (Neurological): Unimpaired  Sensory (Neurological): Unimpaired  Palpation: No palpable anomalies  Palpation: No palpable anomalies   Assessment  Primary Diagnosis & Pertinent Problem List: The  primary encounter diagnosis was Chronic foot pain (Primary Area of Pain) (Right). 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  Status Diagnosis  Recurring Recurring Unimproved 1. Chronic foot pain (Primary Area of Pain) (Right)   2. Chronic ankle pain (Secondary Area of Pain) (Right)   3. Chronic thoracic back pain [facility]) (Midline)   4. Cervical facet syndrome (Bilateral)   5. 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 Disorder of superficial peroneal nerve (Right)   8. History of fusion of cervical spine   9. Cervicalgia (Bilateral) (L>R)   10. Spondylosis without myelopathy or radiculopathy, cervical region   11. Neurogenic foot pain (Right)   12. Chronic musculoskeletal pain   13. Chronic  pain syndrome     Problems updated and reviewed during this visit: Problem  History of Fusion of Cervical Spine  Cervicalgia (Bilateral) (L>R)   Plan of Care  Pharmacotherapy (Medications Ordered): Meds ordered this encounter  Medications  . pregabalin (LYRICA) 150 MG capsule    Sig: Take 1 capsule (150 mg total) by mouth 3 (three) times daily.    Dispense:  90 capsule    Refill:  0    Do not place this medication, or any other prescription from our practice, on "Automatic Refill". Patient may have prescription filled one day early if pharmacy is closed on scheduled  refill date.  . methocarbamol (ROBAXIN) 750 MG tablet    Sig: Take 1 tablet (750 mg total) by mouth every 8 (eight) hours as needed for muscle spasms.    Dispense:  90 tablet    Refill:  0    Do not place this medication, or any other prescription from our practice, on "Automatic Refill". Patient may have prescription filled one day early if pharmacy is closed on scheduled refill date.  Marland Kitchen oxyCODONE (OXY IR/ROXICODONE) 5 MG immediate release tablet    Sig: Take 1 tablet (5 mg total) by mouth every 8 (eight) hours as needed for severe pain. Must last 30 days.    Dispense:  90 tablet    Refill:  0    [place] STOP ACT - Not applicable. Fill one day early if pharmacy is closed on scheduled refill date. Must last 30 days.   Medications administered today: Caleb [name] had no medications administered during this visit.   Procedure Orders     Radiofrequency ablation, other     Radiofrequency,Cervical Lab Orders  No laboratory test(s) ordered today   Imaging Orders  No imaging studies ordered today   Referral Orders  No referral(s) requested today   Interventional management options: Planned, scheduled, and/or pending:   1st. Therapeutic right-sided Superficial Peroneal [Fibular] &  Saphenous Nerve RFA #1  under fluoroscopic guidance and IV sedation 2nd. Therapeutic bilateral cervical facet RFA under fluoroscopic guidance and IV sedation, starting with the left side.   Considering:   Palliative/diagnostic right-sidedCommon Peroneal nerve block#3 Palliative right-sided common peroneal nerve RFA #2(last done on 06/21/2017) Diagnostic right ankle block Diagnosticright lumbar sympathetic block Diagnostic midlinethoracic epidural steroid injection Diagnostic bilateral thoracic facet block Possible bilateral thoracic facet RFA Diagnostic cervical epidural steroid injection Diagnostic bilateral cervical facet block #3 Possible bilateral cervical facet RFA   Palliative PRN  treatment(s):   Palliative left sided cervical epidural steroid injection #4 Palliative right-sided common peroneal nerve blocks Palliative right-sided common peroneal nerve RFA #2(last done on 06/21/2017)   Provider-requested follow-up: No follow-ups on file.  Future Appointments  Date Time Provider [place]  10/25/2017 12:30 PM Milinda Pointer, MD [facility] None  12/14/2017 10:00 AM Virginia Crews, MD [facility] None  12/19/2017  9:00 AM Vevelyn Francois, NP [facility] None   Primary Care Physician: Virginia Crews, MD Location: [facility] Note by: Gaspar Cola, MD Date: 10/19/2017; Time: 3:48 PM

## 2017-10-19 ENCOUNTER — Ambulatory Visit: Payer: Medicaid Other | Attending: Pain Medicine | Admitting: Pain Medicine

## 2017-10-19 ENCOUNTER — Encounter: Payer: Self-pay | Admitting: Pain Medicine

## 2017-10-19 ENCOUNTER — Other Ambulatory Visit: Payer: Self-pay

## 2017-10-19 VITALS — BP 114/75 | HR 75 | Temp 98.1°F | Ht 68.0 in | Wt 205.0 lb

## 2017-10-19 DIAGNOSIS — I2510 Atherosclerotic heart disease of native coronary artery without angina pectoris: Secondary | ICD-10-CM | POA: Insufficient documentation

## 2017-10-19 DIAGNOSIS — Z8249 Family history of ischemic heart disease and other diseases of the circulatory system: Secondary | ICD-10-CM | POA: Insufficient documentation

## 2017-10-19 DIAGNOSIS — Z79899 Other long term (current) drug therapy: Secondary | ICD-10-CM | POA: Insufficient documentation

## 2017-10-19 DIAGNOSIS — M5030 Other cervical disc degeneration, unspecified cervical region: Secondary | ICD-10-CM | POA: Insufficient documentation

## 2017-10-19 DIAGNOSIS — M4803 Spinal stenosis, cervicothoracic region: Secondary | ICD-10-CM | POA: Insufficient documentation

## 2017-10-19 DIAGNOSIS — N529 Male erectile dysfunction, unspecified: Secondary | ICD-10-CM | POA: Insufficient documentation

## 2017-10-19 DIAGNOSIS — F17210 Nicotine dependence, cigarettes, uncomplicated: Secondary | ICD-10-CM | POA: Insufficient documentation

## 2017-10-19 DIAGNOSIS — M7918 Myalgia, other site: Secondary | ICD-10-CM | POA: Diagnosis not present

## 2017-10-19 DIAGNOSIS — J449 Chronic obstructive pulmonary disease, unspecified: Secondary | ICD-10-CM | POA: Diagnosis not present

## 2017-10-19 DIAGNOSIS — M79602 Pain in left arm: Secondary | ICD-10-CM | POA: Diagnosis not present

## 2017-10-19 DIAGNOSIS — M79671 Pain in right foot: Secondary | ICD-10-CM | POA: Diagnosis not present

## 2017-10-19 DIAGNOSIS — Z79891 Long term (current) use of opiate analgesic: Secondary | ICD-10-CM | POA: Diagnosis not present

## 2017-10-19 DIAGNOSIS — M792 Neuralgia and neuritis, unspecified: Secondary | ICD-10-CM

## 2017-10-19 DIAGNOSIS — M47812 Spondylosis without myelopathy or radiculopathy, cervical region: Secondary | ICD-10-CM

## 2017-10-19 DIAGNOSIS — M5134 Other intervertebral disc degeneration, thoracic region: Secondary | ICD-10-CM | POA: Insufficient documentation

## 2017-10-19 DIAGNOSIS — G894 Chronic pain syndrome: Secondary | ICD-10-CM | POA: Diagnosis not present

## 2017-10-19 DIAGNOSIS — I509 Heart failure, unspecified: Secondary | ICD-10-CM | POA: Diagnosis not present

## 2017-10-19 DIAGNOSIS — Z9049 Acquired absence of other specified parts of digestive tract: Secondary | ICD-10-CM | POA: Insufficient documentation

## 2017-10-19 DIAGNOSIS — I110 Hypertensive heart disease with heart failure: Secondary | ICD-10-CM | POA: Insufficient documentation

## 2017-10-19 DIAGNOSIS — M5412 Radiculopathy, cervical region: Secondary | ICD-10-CM | POA: Diagnosis not present

## 2017-10-19 DIAGNOSIS — Z7982 Long term (current) use of aspirin: Secondary | ICD-10-CM | POA: Insufficient documentation

## 2017-10-19 DIAGNOSIS — M4802 Spinal stenosis, cervical region: Secondary | ICD-10-CM | POA: Diagnosis not present

## 2017-10-19 DIAGNOSIS — E785 Hyperlipidemia, unspecified: Secondary | ICD-10-CM | POA: Insufficient documentation

## 2017-10-19 DIAGNOSIS — G4733 Obstructive sleep apnea (adult) (pediatric): Secondary | ICD-10-CM | POA: Diagnosis not present

## 2017-10-19 DIAGNOSIS — Z5181 Encounter for therapeutic drug level monitoring: Secondary | ICD-10-CM | POA: Diagnosis not present

## 2017-10-19 DIAGNOSIS — M542 Cervicalgia: Secondary | ICD-10-CM | POA: Diagnosis not present

## 2017-10-19 DIAGNOSIS — M546 Pain in thoracic spine: Secondary | ICD-10-CM | POA: Diagnosis not present

## 2017-10-19 DIAGNOSIS — M25571 Pain in right ankle and joints of right foot: Secondary | ICD-10-CM | POA: Insufficient documentation

## 2017-10-19 DIAGNOSIS — I252 Old myocardial infarction: Secondary | ICD-10-CM | POA: Insufficient documentation

## 2017-10-19 DIAGNOSIS — M4722 Other spondylosis with radiculopathy, cervical region: Secondary | ICD-10-CM | POA: Insufficient documentation

## 2017-10-19 DIAGNOSIS — M25572 Pain in left ankle and joints of left foot: Secondary | ICD-10-CM | POA: Diagnosis not present

## 2017-10-19 DIAGNOSIS — G5731 Lesion of lateral popliteal nerve, right lower limb: Secondary | ICD-10-CM | POA: Diagnosis not present

## 2017-10-19 DIAGNOSIS — Z981 Arthrodesis status: Secondary | ICD-10-CM | POA: Diagnosis not present

## 2017-10-19 DIAGNOSIS — Z888 Allergy status to other drugs, medicaments and biological substances status: Secondary | ICD-10-CM | POA: Insufficient documentation

## 2017-10-19 DIAGNOSIS — G8929 Other chronic pain: Secondary | ICD-10-CM

## 2017-10-19 MED ORDER — OXYCODONE HCL 5 MG PO TABS: 5.0000 mg | ORAL_TABLET | Freq: Three times a day (TID) | ORAL | 0 refills | Status: DC | PRN

## 2017-10-19 MED ORDER — PREGABALIN 150 MG PO CAPS: 150.0000 mg | ORAL_CAPSULE | Freq: Three times a day (TID) | ORAL | 0 refills | Status: DC

## 2017-10-19 MED ORDER — METHOCARBAMOL 750 MG PO TABS: 750.0000 mg | ORAL_TABLET | Freq: Three times a day (TID) | ORAL | 0 refills | Status: DC | PRN

## 2017-10-19 NOTE — Patient Instructions (Addendum)
____________________________________________________________________________________________  Preparing for Procedure with Sedation  Instructions: . Oral Intake: Do not eat or drink anything for at least 8 hours prior to your procedure. . Transportation: Public transportation is not allowed. Bring an adult driver. The driver must be physically present in our waiting room before any procedure can be started. . Physical Assistance: Bring an adult physically capable of assisting you, in the event you need help. This adult should keep you company at home for at least 6 hours after the procedure. . Blood Pressure Medicine: Take your blood pressure medicine with a sip of water the morning of the procedure. . Blood thinners: Notify our staff if you are taking any blood thinners. Depending on which one you take, there will be specific instructions on how and when to stop it. . Diabetics on insulin: Notify the staff so that you can be scheduled 1st case in the morning. If your diabetes requires high dose insulin, take only  of your normal insulin dose the morning of the procedure and notify the staff that you have done so. . Preventing infections: Shower with an antibacterial soap the morning of your procedure. . Build-up your immune system: Take 1000 mg of Vitamin C with every meal (3 times a day) the day prior to your procedure. . Antibiotics: Inform the staff if you have a condition or reason that requires you to take antibiotics before dental procedures. . Pregnancy: If you are pregnant, call and cancel the procedure. . Sickness: If you have a cold, fever, or any active infections, call and cancel the procedure. . Arrival: You must be in the facility at least 30 minutes prior to your scheduled procedure. . Children: Do not bring children with you. . Dress appropriately: Bring dark clothing that you would not mind if they get stained. . Valuables: Do not bring any jewelry or valuables.  Procedure  appointments are reserved for interventional treatments only. . No Prescription Refills. . No medication changes will be discussed during procedure appointments. . No disability issues will be discussed.  Reasons to call and reschedule or cancel your procedure: (Following these recommendations will minimize the risk of a serious complication.) . Surgeries: Avoid having procedures within 2 weeks of any surgery. (Avoid for 2 weeks before or after any surgery). . Flu Shots: Avoid having procedures within 2 weeks of a flu shots or . (Avoid for 2 weeks before or after immunizations). . Barium: Avoid having a procedure within 7-10 days after having had a radiological study involving the use of radiological contrast. (Myelograms, Barium swallow or enema study). . Heart attacks: Avoid any elective procedures or surgeries for the initial 6 months after a "Myocardial Infarction" (Heart Attack). . Blood thinners: It is imperative that you stop these medications before procedures. Let us know if you if you take any blood thinner.  . Infection: Avoid procedures during or within two weeks of an infection (including chest colds or gastrointestinal problems). Symptoms associated with infections include: Localized redness, fever, chills, night sweats or profuse sweating, burning sensation when voiding, cough, congestion, stuffiness, runny nose, sore throat, diarrhea, nausea, vomiting, cold or Flu symptoms, recent or current infections. It is specially important if the infection is over the area that we intend to treat. . Heart and lung problems: Symptoms that may suggest an active cardiopulmonary problem include: cough, chest pain, breathing difficulties or shortness of breath, dizziness, ankle swelling, uncontrolled high or unusually low blood pressure, and/or palpitations. If you are experiencing any of these symptoms, cancel   your procedure and contact your primary care physician for an evaluation.  Remember:   Regular Business hours are:  Monday to Thursday 8:00 AM to 4:00 PM  Provider's Schedule: Milinda Pointer, MD:  Procedure days: Tuesday and Thursday 7:30 AM to 4:00 PM  Gillis Santa, MD:  Procedure days: Monday and Wednesday 7:30 AM to 4:00 PM ____________________________________________________________________________________________   ____________________________________________________________________________________________  Medication Rules  Applies to: All patients receiving prescriptions (written or electronic).  Pharmacy of record: Pharmacy where electronic prescriptions will be sent. If written prescriptions are taken to a different pharmacy, please inform the nursing staff. The pharmacy listed in the electronic medical record should be the one where you would like electronic prescriptions to be sent.  Prescription refills: Only during scheduled appointments. Applies to both, written and electronic prescriptions.  NOTE: The following applies primarily to controlled substances (Opioid* Pain Medications).   Patient's responsibilities: 1. Pain Pills: Bring all pain pills to every appointment (except for procedure appointments). 2. Pill Bottles: Bring pills in original pharmacy bottle. Always bring newest bottle. Bring bottle, even if empty. 3. Medication refills: You are responsible for knowing and keeping track of what medications you need refilled. The day before your appointment, write a list of all prescriptions that need to be refilled. Bring that list to your appointment and give it to the admitting nurse. Prescriptions will be written only during appointments. If you forget a medication, it will not be "Called in", "Faxed", or "electronically sent". You will need to get another appointment to get these prescribed. 4. Prescription Accuracy: You are responsible for carefully inspecting your prescriptions before leaving our office. Have the discharge nurse carefully go over  each prescription with you, before taking them home. Make sure that your name is accurately spelled, that your address is correct. Check the name and dose of your medication to make sure it is accurate. Check the number of pills, and the written instructions to make sure they are clear and accurate. Make sure that you are given enough medication to last until your next medication refill appointment. 5. Taking Medication: Take medication as prescribed. Never take more pills than instructed. Never take medication more frequently than prescribed. Taking less pills or less frequently is permitted and encouraged, when it comes to controlled substances (written prescriptions).  6. Inform other Doctors: Always inform, all of your healthcare providers, of all the medications you take. 7. Pain Medication from other Providers: You are not allowed to accept any additional pain medication from any other Doctor or Healthcare provider. There are two exceptions to this rule. (see below) In the event that you require additional pain medication, you are responsible for notifying us, as stated below. 8. Medication Agreement: You are responsible for carefully reading and following our Medication Agreement. This must be signed before receiving any prescriptions from our practice. Safely store a copy of your signed Agreement. Violations to the Agreement will result in no further prescriptions. (Additional copies of our Medication Agreement are available upon request.) 9. Laws, Rules, & Regulations: All patients are expected to follow all Federal and [employer], Rules, [employer]. Ignorance of the Laws does not constitute a valid excuse. The use of any illegal substances is prohibited. 10. Adopted CDC guidelines & recommendations: Target dosing levels will be at or below 60 MME/day. Use of benzodiazepines** is not recommended.  Exceptions: There are only two exceptions to the rule of not receiving pain medications  from other Healthcare Providers. 1. Exception #1 (Emergencies): In the  event of an emergency (i.e.: accident requiring emergency care), you are allowed to receive additional pain medication. However, you are responsible for: As soon as you are able, call our office [phone], at any time of the day or night, and leave a message stating your name, the date and nature of the emergency, and the name and dose of the medication prescribed. In the event that your call is answered by a member of our staff, make sure to document and save the date, time, and the name of the person that took your information.  2. Exception #2 (Planned Surgery): In the event that you are scheduled by another doctor or dentist to have any type of surgery or procedure, you are allowed (for a period no longer than 30 days), to receive additional pain medication, for the acute post-op pain. However, in this case, you are responsible for picking up a copy of our "Post-op Pain Management for Surgeons" handout, and giving it to your surgeon or dentist. This document is available at our office, and does not require an appointment to obtain it. Simply go to our office during business hours (Monday-Thursday from 8:00 AM to 4:00 PM) (Friday 8:00 AM to 12:00 Noon) or if you have a scheduled appointment with [place], prior to your surgery, and ask for it by name. In addition, you will need to provide [place] with your name, name of your surgeon, type of surgery, and date of procedure or surgery.  *Opioid medications include: morphine, codeine, oxycodone, oxymorphone, hydrocodone, hydromorphone, meperidine, tramadol, tapentadol, buprenorphine, fentanyl, methadone. **Benzodiazepine medications include: diazepam (Valium), alprazolam (Xanax), clonazepam (Klonopine), lorazepam (Ativan), clorazepate (Tranxene), chlordiazepoxide (Librium), estazolam (Prosom), oxazepam (Serax), temazepam (Restoril), triazolam (Halcion) (Last updated:  03/03/2017) ____________________________________________________________________________________________   ____________________________________________________________________________________________  Medication Recommendations and Reminders  Applies to: All patients receiving prescriptions (written and/or electronic).  Medication Rules & Regulations: These rules and regulations exist for your safety and that of others. They are not flexible and neither are we. Dismissing or ignoring them will be considered "non-compliance" with medication therapy, resulting in complete and irreversible termination of such therapy. (See document titled "Medication Rules" for more details.) In all conscience, because of safety reasons, we cannot continue providing a therapy where the patient does not follow instructions.  Pharmacy of record:   Definition: This is the pharmacy where your electronic prescriptions will be sent.   We do not endorse any particular pharmacy.  You are not restricted in your choice of pharmacy.  The pharmacy listed in the electronic medical record should be the one where you want electronic prescriptions to be sent.  If you choose to change pharmacy, simply notify our nursing staff of your choice of new pharmacy.  Recommendations:  Keep all of your pain medications in a safe place, under lock and key, even if you live alone.   After you fill your prescription, take 1 week's worth of pills and put them away in a safe place. You should keep a separate, properly labeled bottle for this purpose. The remainder should be kept in the original bottle. Use this as your primary supply, until it runs out. Once it's gone, then you know that you have 1 week's worth of medicine, and it is time to come in for a prescription refill. If you do this correctly, it is unlikely that you will ever run out of medicine.  To make sure that the above recommendation works, it is very important that you  make sure your medication refill appointments are  scheduled at least 1 week before you run out of medicine. To do this in an effective manner, make sure that you do not leave the office without scheduling your next medication management appointment. Always ask the nursing staff to show you in your prescription , when your medication will be running out. Then arrange for the receptionist to get you a return appointment, at least 7 days before you run out of medicine. Do not wait until you have 1 or 2 pills left, to come in. This is very poor planning and does not take into consideration that we may need to cancel appointments due to bad weather, sickness, or emergencies affecting our staff.  "Partial Fill": If for any reason your pharmacy does not have enough pills/tablets to completely fill or refill your prescription, do not allow for a "partial fill". You will need a separate prescription to fill the remaining amount, which we will not provide. If the reason for the partial fill is your insurance, you will need to talk to the pharmacist about payment alternatives for the remaining tablets, but again, do not accept a partial fill.  Prescription refills and/or changes in medication(s):   Prescription refills, and/or changes in dose or medication, will be conducted only during scheduled medication management appointments. (Applies to both, written and electronic prescriptions.)  No refills on procedure days. No medication will be changed or started on procedure days. No changes, adjustments, and/or refills will be conducted on a procedure day. Doing so will interfere with the diagnostic portion of the procedure.  No phone refills. No medications will be "called into the pharmacy".  No Fax refills.  No weekend refills.  No Holliday refills.  No after hours refills.  Remember:  Business hours are:  Monday to Thursday 8:00 AM to 4:00 PM Provider's Schedule: Dionisio David, NP - Appointments are:   Medication management: Monday to Thursday 8:00 AM to 4:00 PM Milinda Pointer, MD - Appointments are:  Medication management: Monday and Wednesday 8:00 AM to 4:00 PM Procedure day: Tuesday and Thursday 7:30 AM to 4:00 PM Gillis Santa, MD - Appointments are:  Medication management: Tuesday and Thursday 8:00 AM to 4:00 PM Procedure day: Monday and Wednesday 7:30 AM to 4:00 PM (Last update: 03/03/2017) ____________________________________________________________________________________________   ____________________________________________________________________________________________  CANNABIDIOL (AKA: CBD Oil or Pills)  Applies to: All patients receiving prescriptions of controlled substances (written and/or electronic).  General Information: Cannabidiol (CBD) was discovered in 18. It is one of some 113 identified cannabinoids in cannabis (Marijuana) plants, accounting for up to 40% of the plant's extract. As of 2018, preliminary clinical research on cannabidiol included studies of anxiety, cognition, movement disorders, and pain.  Cannabidiol is consummed in multiple ways, including inhalation of cannabis smoke or vapor, as an aerosol spray into the cheek, and by mouth. It may be supplied as CBD oil containing CBD as the active ingredient (no added tetrahydrocannabinol (THC) or terpenes), a full-plant CBD-dominant hemp extract oil, capsules, dried cannabis, or as a liquid solution. CBD is thought not have the same psychoactivity as THC, and may affect the actions of THC. Studies suggest that CBD may interact with different biological targets, including cannabinoid receptors and other neurotransmitter receptors. As of 2018 the mechanism of action for its biological effects has not been determined.  In the [place], cannabidiol has a limited approval by the Food and Drug Administration (FDA) for treatment of only two types of epilepsy disorders. The side effects of long-term use of the  drug include  somnolence, decreased appetite, diarrhea, fatigue, malaise, weakness, sleeping problems, and others.  CBD remains a Schedule I drug prohibited for any use.  Legality: Some manufacturers ship CBD products nationally, an illegal action which the FDA has not enforced in 2018, with CBD remaining the subject of an FDA investigational new drug evaluation, and is not considered legal as a dietary supplement or food ingredient as of December 2018. Federal illegality has made it difficult historically to conduct research on CBD. CBD is openly sold in head shops and health food stores in some states where such sales have not been explicitly legalized.  Warning: Because it is not FDA approved for general use or treatment of pain, it is not required to undergo the same manufacturing controls as prescription drugs.  This means that the available cannabidiol (CBD) may be contaminated with THC.  If this is the case, it will trigger a positive urine drug screen (UDS) test for cannabinoids (Marijuana).  Because a positive UDS for illicit substances is a violation of our medication agreement, your opioid analgesics (pain medicine) may be permanently discontinued. (Last update: 03/24/2017) ____________________________________________________________________________________________ Dennis Bast were given prescriptions for Lyrica, Oxycodone, and Methocarbamol today.

## 2017-10-24 ENCOUNTER — Telehealth: Payer: Self-pay | Admitting: *Deleted

## 2017-10-24 NOTE — Telephone Encounter (Signed)
Patient notified Oxycodone approved.

## 2017-10-25 ENCOUNTER — Ambulatory Visit
Admission: RE | Admit: 2017-10-25 | Discharge: 2017-10-25 | Disposition: A | Discharge status: Home / Self Care | Payer: Medicaid Other | Source: Ambulatory Visit | Attending: Pain Medicine | Admitting: Pain Medicine

## 2017-10-25 ENCOUNTER — Ambulatory Visit (HOSPITAL_BASED_OUTPATIENT_CLINIC_OR_DEPARTMENT_OTHER): Payer: Medicaid Other | Admitting: Pain Medicine

## 2017-10-25 ENCOUNTER — Encounter: Payer: Self-pay | Admitting: Pain Medicine

## 2017-10-25 ENCOUNTER — Other Ambulatory Visit: Payer: Self-pay

## 2017-10-25 VITALS — BP 111/76 | HR 71 | Temp 98.2°F | Resp 19 | Ht 68.0 in | Wt 205.0 lb

## 2017-10-25 DIAGNOSIS — M25571 Pain in right ankle and joints of right foot: Secondary | ICD-10-CM

## 2017-10-25 DIAGNOSIS — Z7982 Long term (current) use of aspirin: Secondary | ICD-10-CM | POA: Insufficient documentation

## 2017-10-25 DIAGNOSIS — G8929 Other chronic pain: Secondary | ICD-10-CM | POA: Diagnosis not present

## 2017-10-25 DIAGNOSIS — Z79891 Long term (current) use of opiate analgesic: Secondary | ICD-10-CM | POA: Diagnosis not present

## 2017-10-25 DIAGNOSIS — G5731 Lesion of lateral popliteal nerve, right lower limb: Secondary | ICD-10-CM

## 2017-10-25 DIAGNOSIS — M79671 Pain in right foot: Secondary | ICD-10-CM | POA: Diagnosis not present

## 2017-10-25 DIAGNOSIS — M47812 Spondylosis without myelopathy or radiculopathy, cervical region: Secondary | ICD-10-CM

## 2017-10-25 DIAGNOSIS — G8918 Other acute postprocedural pain: Secondary | ICD-10-CM

## 2017-10-25 DIAGNOSIS — M792 Neuralgia and neuritis, unspecified: Secondary | ICD-10-CM | POA: Diagnosis not present

## 2017-10-25 DIAGNOSIS — Z9889 Other specified postprocedural states: Secondary | ICD-10-CM | POA: Diagnosis not present

## 2017-10-25 DIAGNOSIS — Z79899 Other long term (current) drug therapy: Secondary | ICD-10-CM | POA: Diagnosis not present

## 2017-10-25 DIAGNOSIS — M542 Cervicalgia: Secondary | ICD-10-CM

## 2017-10-25 DIAGNOSIS — Z981 Arthrodesis status: Secondary | ICD-10-CM | POA: Diagnosis not present

## 2017-10-25 DIAGNOSIS — Z9049 Acquired absence of other specified parts of digestive tract: Secondary | ICD-10-CM | POA: Insufficient documentation

## 2017-10-25 DIAGNOSIS — M5030 Other cervical disc degeneration, unspecified cervical region: Secondary | ICD-10-CM

## 2017-10-25 MED ORDER — FENTANYL CITRATE (PF) 100 MCG/2ML IJ SOLN
25.0000 ug | INTRAMUSCULAR | Status: DC | PRN
  Administered 2017-10-25: 100 ug via INTRAVENOUS
  Filled 2017-10-25: qty 2

## 2017-10-25 MED ORDER — HYDROCODONE-ACETAMINOPHEN 5-325 MG PO TABS: 1.0000 | ORAL_TABLET | Freq: Four times a day (QID) | ORAL | 0 refills | Status: DC | PRN

## 2017-10-25 MED ORDER — LIDOCAINE HCL 2 % IJ SOLN
20.0000 mL | Freq: Once | INTRAMUSCULAR | Status: AC
  Administered 2017-10-25: 400 mg
  Filled 2017-10-25: qty 20

## 2017-10-25 MED ORDER — METHYLPREDNISOLONE ACETATE 80 MG/ML IJ SUSP
80.0000 mg | Freq: Once | INTRAMUSCULAR | Status: AC
  Administered 2017-10-25: 80 mg via INTRAMUSCULAR
  Filled 2017-10-25: qty 1

## 2017-10-25 MED ORDER — ROPIVACAINE HCL 2 MG/ML IJ SOLN
9.0000 mL | Freq: Once | INTRAMUSCULAR | Status: AC
  Administered 2017-10-25: 9 mL
  Filled 2017-10-25: qty 10

## 2017-10-25 MED ORDER — LACTATED RINGERS IV SOLN: 1000.0000 mL | Freq: Once | INTRAVENOUS | Status: DC

## 2017-10-25 MED ORDER — MIDAZOLAM HCL 5 MG/5ML IJ SOLN
1.0000 mg | INTRAMUSCULAR | Status: DC | PRN
  Administered 2017-10-25: 3 mg via INTRAVENOUS
  Filled 2017-10-25: qty 5

## 2017-10-25 NOTE — Progress Notes (Signed)
Safety precautions to be maintained throughout the outpatient stay will include: orient to surroundings, keep bed in low position, maintain call bell within reach at all times, provide assistance with transfer out of bed and ambulation.  

## 2017-10-25 NOTE — Progress Notes (Signed)
Patient's Name: Caleb [name]  MRN: [id number]  Referring Provider: Virginia Crews, MD  DOB: Apr 18, 1955  PCP: Virginia Crews, MD  DOS: 10/25/2017  Note by: Gaspar Cola, MD  Service setting: Ambulatory outpatient  Specialty: Interventional Pain Management  Patient type: Established  Location: [facility] (AMB) Pain Management Facility  Visit type: Interventional Procedure   Primary Reason for Visit: Interventional Pain Management Treatment. CC: Foot Pain (right)  Procedure:          Anesthesia, Analgesia, Anxiolysis:  Type: Therapeutic  Superficial Peroneal [Fibular] &  Saphenous  Nerve Radiofrequency Ablation/Neurotomy #1  Region: distal anterolateral and anteromedial lower extremity   Level: Distal lower extremity. Laterality: Right-sided  Type: Moderate (Conscious) Sedation combined with Local Anesthesia Indication(s): Analgesia and Anxiety Route: Intravenous (IV) IV Access: Secured Sedation: Meaningful verbal contact was maintained at all times during the procedure  Local Anesthetic: Lidocaine 1-2%  Position: Supine         Indications: 1. Chronic foot pain (Primary Area of Pain) (Right)   2. Disorder of superficial peroneal nerve (Right)   3. Neurogenic foot pain (Right)   4. Chronic ankle pain (Secondary Area of Pain) (Right)    Mr. Zaring has been dealing with the above chronic pain for longer than three months and has either failed to respond, was unable to tolerate, or simply did not get enough benefit from other more conservative therapies including, but not limited to: 1. Over-the-counter medications 2. Anti-inflammatory medications 3. Muscle relaxants 4. Membrane stabilizers 5. Opioids 6. Physical therapy and/or chiropractic manipulation 7. Modalities (Heat, ice, etc.) 8. Invasive techniques such as nerve blocks. Mr. Deike has attained more than 50% relief of the pain from a series of diagnostic injections conducted in separate occasions.  Pain  Score: Pre-procedure: 4 /10 Post-procedure: 0-No pain/10  Pre-op Assessment:  Mr. Brandl is a 62 y.o. (year old), male patient, seen today for interventional treatment. He  has a past surgical history that includes Cardiac catheterization (2015); Lumbar laminectomy (1989, 1999); Cervical fusion (1988, 1998); Liver surgery (2015); Partial colectomy (1990); Inguinal hernia repair (Bilateral, 1975); Lithotripsy; and Colonoscopy with propofol (N/A, 02/24/2017). Mr. Zietlow has a current medication list which includes the following prescription(s): albuterol, aspirin ec, fluticasone, gabapentin, hydrochlorothiazide, isosorbide mononitrate, losartan, methocarbamol, metoprolol tartrate, nitroglycerin, oxycodone, pregabalin, rosuvastatin, secukinumab, tiotropium, triamcinolone ointment, hydrocodone-acetaminophen, and hydrocodone-acetaminophen, and the following Facility-Administered Medications: fentanyl, lactated ringers, and midazolam. His primarily concern today is the Foot Pain (right)  Initial Vital Signs:  Pulse/HCG Rate: 71ECG Heart Rate: 71 Temp: 98 F (36.7 C) Resp: 16 BP: 116/75 SpO2: 98 %  BMI: Estimated body mass index is 31.17 kg/m as calculated from the following:   Height as of this encounter: 5\' 8"  (1.727 m).   Weight as of this encounter: 205 lb (93 kg).  Risk Assessment: Allergies: Reviewed. He is allergic to lisinopril.  Allergy Precautions: None required Coagulopathies: Reviewed. None identified.  Blood-thinner therapy: None at this time Active Infection(s): Reviewed. None identified. Mr. Tiberio is afebrile  Site Confirmation: Mr. Hallenbeck was asked to confirm the procedure and laterality before marking the site Procedure checklist: Completed Consent: Before the procedure and under the influence of no sedative(s), amnesic(s), or anxiolytics, the patient was informed of the treatment options, risks and possible complications. To fulfill our ethical and legal obligations, as  recommended by the American Medical Association's Code of Ethics, I have informed the patient of my clinical impression; the nature and purpose of the treatment or procedure; the risks,  benefits, and possible complications of the intervention; the alternatives, including doing nothing; the risk(s) and benefit(s) of the alternative treatment(s) or procedure(s); and the risk(s) and benefit(s) of doing nothing. The patient was provided information about the general risks and possible complications associated with the procedure. These may include, but are not limited to: failure to achieve desired goals, infection, bleeding, organ or nerve damage, allergic reactions, paralysis, and death. In addition, the patient was informed of those risks and complications associated to the procedure, such as failure to decrease pain; infection; bleeding; organ or nerve damage with subsequent damage to sensory, motor, and/or autonomic systems, resulting in permanent pain, numbness, and/or weakness of one or several areas of the body; allergic reactions; (i.e.: anaphylactic reaction); and/or death. Furthermore, the patient was informed of those risks and complications associated with the medications. These include, but are not limited to: allergic reactions (i.e.: anaphylactic or anaphylactoid reaction(s)); adrenal axis suppression; blood sugar elevation that in diabetics may result in ketoacidosis or comma; water retention that in patients with history of congestive heart failure may result in shortness of breath, pulmonary edema, and decompensation with resultant heart failure; weight gain; swelling or edema; medication-induced neural toxicity; particulate matter embolism and blood vessel occlusion with resultant organ, and/or nervous system infarction; and/or aseptic necrosis of one or more joints. Finally, the patient was informed that Medicine is not an exact science; therefore, there is also the possibility of unforeseen or  unpredictable risks and/or possible complications that may result in a catastrophic outcome. The patient indicated having understood very clearly. We have given the patient no guarantees and we have made no promises. Enough time was given to the patient to ask questions, all of which were answered to the patient's satisfaction. Mr. Merry has indicated that he wanted to continue with the procedure. Attestation: I, the ordering provider, attest that I have discussed with the patient the benefits, risks, side-effects, alternatives, likelihood of achieving goals, and potential problems during recovery for the procedure that I have provided informed consent. Date  Time: 10/25/2017 12:20 PM  Pre-Procedure Preparation:  Monitoring: As per clinic protocol. Respiration, ETCO2, SpO2, BP, heart rate and rhythm monitor placed and checked for adequate function Safety Precautions: Patient was assessed for positional comfort and pressure points before starting the procedure. Time-out: I initiated and conducted the "Time-out" before starting the procedure, as per protocol. The patient was asked to participate by confirming the accuracy of the "Time Out" information. Verification of the correct person, site, and procedure were performed and confirmed by me, the nursing staff, and the patient. "Time-out" conducted as per Joint Commission's Universal Protocol (UP.01.01.01). Time: 1257  Description of Procedure:          Target Area: For the superficial peroneal [fibular] nerve, the target area is the lateral aspect of the distal lower extremity, above the lateral malleolus, as the nerve exits the crural fascia and the lateral compartment of the leg.  For the saphenous nerve, the target area is the medial aspect of the knee, at the joint line, behind the sartorius muscle. Approach: Percutaneous approach. Area Prepped: Entire lower extremity region Prepping solution: ChloraPrep (2% chlorhexidine gluconate and 70%  isopropyl alcohol) Safety Precautions: Aspiration looking for blood return was conducted prior to all injections. At no point did we inject any substances, as a needle was being advanced. Before injecting, the patient was told to immediately notify me if he was experiencing any new onset of "ringing in the ears, or metallic taste in the  mouth". No attempts were made at seeking any paresthesias. Safe injection practices and needle disposal techniques used. Medications properly checked for expiration dates. SDV (single dose vial) medications used. After the completion of the procedure, all disposable equipment used was discarded in the proper designated medical waste containers. Local Anesthesia: Protocol guidelines were followed. The patient was positioned over the fluoroscopy table. The area was prepped in the usual manner. The time-out was completed. The target area was identified using fluoroscopy. A 12-in long, straight, sterile hemostat was used with fluoroscopic guidance to locate the targets for each level blocked. Once located, the skin was marked with an approved surgical skin marker. Once all sites were marked, the skin (epidermis, dermis, and hypodermis), as well as deeper tissues (fat, connective tissue and muscle) were infiltrated with a small amount of a short-acting local anesthetic, loaded on a 10cc syringe with a 25G, 1.5-in  Needle. An appropriate amount of time was allowed for local anesthetics to take effect before proceeding to the next step. Local Anesthetic: Lidocaine 2.0% The unused portion of the local anesthetic was discarded in the proper designated containers. Technical explanation of process:  Radiofrequency Ablation (RFA) Superficial Peroneal Nerve RFA: The target area for the Superficial Peroneal (superior fibular) nerve was identified. Under fluoroscopic guidance, a Radiofrequency needle was inserted until adequate position was attained. Sensory and motor testing was conducted to  properly adjust the optimal position of the needle. This was archived by eliciting sensory stimulation below below 0.7 and above 0.3 volts. This was followed by motor testing ensuring that there was no significant motor stimulation below 2.5 times the identified sensory threshold. Once satisfactory placement of the needle was achieved, the numbing solution was slowly injected after negative aspiration for blood. 2.0 mL of the local anesthetic solution was injected without difficulty or complication. After waiting between 3 to 4 minutes, the ablation was performed. Once completed, the needle was then removed and the area cleansed, making sure to leave some of the prepping solution back to take advantage of its long term bactericidal properties. The needle was inspected and found to be intact.  Radiofrequency lesioning (ablation):  Radiofrequency Generator: NeuroTherm [id number] Sensory Stimulation Parameters: 50 Hz was used to locate & identify the nerve, making sure that the needle was positioned such that there was no sensory stimulation below 0.3 V or above 0.7 V. Motor Stimulation Parameters: 2 Hz was used to evaluate the motor component. Care was taken not to lesion any nerves that demonstrated motor stimulation of the lower extremities at an output of less than 2.5 times that of the sensory threshold, or a maximum of 2.0 V. Lesioning Technique Parameters: Standard Radiofrequency settings. (Not bipolar or pulsed.) Temperature Settings: 80 degrees C Lesioning time: 60 seconds Rate: N/A Width: N/A Set: N/A   Saphenous nerve RFA: The target area for the saphenous nerve was identified. Under fluoroscopic guidance, a Radiofrequency needle was inserted until adequate position was attained. Sensory and motor testing was conducted to properly adjust the optimal position of the needle. This was archived by eliciting sensory stimulation below below 0.7 and above 0.3 volts. This was followed by motor testing  ensuring that there was no significant motor stimulation below 2.5 times the identified sensory threshold. Once satisfactory placement of the needle was achieved, the numbing solution was slowly injected after negative aspiration for blood. 2.0 mL of the local anesthetic solution was injected without difficulty or complication. After waiting between 3 to 4 minutes, the ablation was performed. Once completed,  the needle was then removed and the area cleansed, making sure to leave some of the prepping solution back to take advantage of its long term bactericidal properties. The needle was inspected and found to be intact.  Intra-operative Compliance: Compliant  Materials & Medications: Needle(s) (Electrode/Cannula) Type: Teflon-coated, curved tip, Radiofrequency needle(s) Gauge: 22G Length: 10cm Numbing solution: 0.2% PF-Ropivacaine + Triamcinolone (40 mg/mL) diluted to a final concentration of 4 mg of Triamcinolone/mL of Ropivacaine The unused portion of the solution was discarded in the proper designated containers.  Once the entire procedure was completed, the treated area was cleaned, making sure to leave some of the prepping solution back to take advantage of its long term bactericidal properties.              Vitals:   10/25/17 1340 10/25/17 1350 10/25/17 1400 10/25/17 1410  BP: 101/76 102/87 90/72 111/76  Pulse:      Resp: 16 17 16 19   Temp:  98.1 F (36.7 C)  98.2 F (36.8 C)  TempSrc:      SpO2: 95% 97% 97% 100%  Weight:      Height:        Start Time: 1257 hrs. End Time: 1339 hrs.  Imaging Guidance (Non-Spinal):          Type of Imaging Technique: Fluoroscopy Guidance (Non-Spinal) Indication(s): Assistance in needle guidance and placement for procedures requiring needle placement in or near specific anatomical locations not easily accessible without such assistance. Exposure Time: Please see nurses notes. Contrast: None used. Fluoroscopic Guidance: I was personally  present during the use of fluoroscopy. "Tunnel Vision Technique" used to obtain the best possible view of the target area. Parallax error corrected before commencing the procedure. "Direction-depth-direction" technique used to introduce the needle under continuous pulsed fluoroscopy. Once target was reached, antero-posterior, oblique, and lateral fluoroscopic projection used confirm needle placement in all planes. Images permanently stored in EMR. Interpretation: No contrast injected. I personally interpreted the imaging intraoperatively. Adequate needle placement confirmed in multiple planes. Permanent images saved into the patient's record.  Antibiotic Prophylaxis:   Anti-infectives (From admission, onward)   None     Indication(s): None identified  Post-operative Assessment:  Post-procedure Vital Signs:  Pulse/HCG Rate: 7166 Temp: 98.2 F (36.8 C) Resp: 19 BP: 111/76 SpO2: 100 %  EBL: None  Complications: No immediate post-treatment complications observed by team, or reported by patient.  Note: The patient tolerated the entire procedure well. A repeat set of vitals were taken after the procedure and the patient was kept under observation following institutional policy, for this type of procedure. Post-procedural neurological assessment was performed, showing return to baseline, prior to discharge. The patient was provided with post-procedure discharge instructions, including a section on how to identify potential problems. Should any problems arise concerning this procedure, the patient was given instructions to immediately contact us, at any time, without hesitation. In any case, we plan to contact the patient by telephone for a follow-up status report regarding this interventional procedure.  Comments:  No additional relevant information.  Plan of Care   Possible POC:  Therapeutic left-sided cervical facet RFA #1 under fluoroscopic guidance and IV sedation   Imaging Orders      DG C-Arm 1-60 Min-No Report  Procedure Orders     Radiofrequency ablation, other     Radiofrequency,Cervical     Radiofrequency,Cervical  Medications ordered for procedure: Meds ordered this encounter  Medications  . lidocaine (XYLOCAINE) 2 % (with pres) injection 400 mg  .  midazolam (VERSED) 5 MG/5ML injection 1-2 mg    Make sure Flumazenil is available in the pyxis when using this medication. If oversedation occurs, administer 0.2 mg IV over 15 sec. If after 45 sec no response, administer 0.2 mg again over 1 min; may repeat at 1 min intervals; not to exceed 4 doses (1 mg)  . fentaNYL (SUBLIMAZE) injection 25-50 mcg    Make sure Narcan is available in the pyxis when using this medication. In the event of respiratory depression (RR< 8/min): Titrate NARCAN (naloxone) in increments of 0.1 to 0.2 mg IV at 2-3 minute intervals, until desired degree of reversal.  . lactated ringers infusion 1,000 mL  . ropivacaine (PF) 2 mg/mL (0.2%) (NAROPIN) injection 9 mL  . methylPREDNISolone acetate (DEPO-MEDROL) injection 80 mg  . HYDROcodone-acetaminophen (NORCO/VICODIN) 5-325 MG tablet    Sig: Take 1 tablet by mouth every 6 (six) hours as needed for up to 7 days for severe pain. Must last 7 days.    Dispense:  28 tablet    Refill:  0    For acute post-operative pain. Not to be refilled. Must last 7 days.  Marland Kitchen HYDROcodone-acetaminophen (NORCO/VICODIN) 5-325 MG tablet    Sig: Take 1 tablet by mouth every 6 (six) hours as needed for up to 7 days for severe pain. Must last 7 days.    Dispense:  28 tablet    Refill:  0    For acute post-operative pain. Not to be refilled.  Must last 7 days.   Medications administered: We administered lidocaine, midazolam, fentaNYL, ropivacaine (PF) 2 mg/mL (0.2%), and methylPREDNISolone acetate.  See the medical record for exact dosing, route, and time of administration.  Disposition: Discharge home  Discharge Date & Time: 10/25/2017; 1411 hrs.   Physician-requested  Follow-up: Return for PPE (2 wks) + Procedure (w/ sedation): (L) C-FCT RFA #1.  Future Appointments  Date Time Provider [place]  11/07/2017 10:45 AM Milinda Pointer, MD [facility] None  11/15/2017  2:15 PM Milinda Pointer, MD [facility] None  12/14/2017 10:00 AM Virginia Crews, MD [facility] None  12/19/2017  9:00 AM Vevelyn Francois, NP [facility] None   Primary Care Physician: Virginia Crews, MD Location: [facility] Out[facility] Note by: Gaspar Cola, MD Date: 10/25/2017; Time: 3:55 PM  Disclaimer:  Medicine is not an Chief Strategy Officer. The only guarantee in medicine is that nothing is guaranteed. It is important to note that the decision to proceed with this intervention was based on the information collected from the patient. The Data and conclusions were drawn from the patient's questionnaire, the interview, and the physical examination. Because the information was provided in large part by the patient, it cannot be guaranteed that it has not been purposely or unconsciously manipulated. Every effort has been made to obtain as much relevant data as possible for this evaluation. It is important to note that the conclusions that lead to this procedure are derived in large part from the available data. Always take into account that the treatment will also be dependent on availability of resources and existing treatment guidelines, considered by other Pain Management Practitioners as being common knowledge and practice, at the time of the intervention. For Medico-Legal purposes, it is also important to point out that variation in procedural techniques and pharmacological choices are the acceptable norm. The indications, contraindications, technique, and results of the above procedure should only be interpreted and judged by a Board-Certified Interventional Pain Specialist with extensive familiarity and expertise in  the same exact procedure and technique.

## 2017-10-25 NOTE — Patient Instructions (Addendum)
___________________________________________________________________________________________  Post-Radiofrequency (RF) Discharge Instructions  You have just completed a Radiofrequency Neurotomy.  The following instructions will provide you with information and guidelines for self-care upon discharge.  If at any time you have questions or concerns please call your physician. DO NOT DRIVE YOURSELF!!  Instructions:  Apply ice: Fill a plastic sandwich bag with crushed ice. Cover it with a small towel and apply to injection site. Apply for 15 minutes then remove x 15 minutes. Repeat sequence on day of procedure, until you go to bed. The purpose is to minimize swelling and discomfort after procedure.  Apply heat: Apply heat to procedure site starting the day following the procedure. The purpose is to treat any soreness and discomfort from the procedure.  Food intake: No eating limitations, unless stipulated above.  Nevertheless, if you have had sedation, you may experience some nausea.  In this case, it may be wise to wait at least two hours prior to resuming regular diet.  Physical activities: Keep activities to a minimum for the first 8 hours after the procedure. For the first 24 hours after the procedure, do not drive a motor vehicle,  Operate heavy machinery, power tools, or handle any weapons.  Consider walking with the use of an assistive device or accompanied by an adult for the first 24 hours.  Do not drink alcoholic beverages including beer.  Do not make any important decisions or sign any legal documents. Go home and rest today.  Resume activities tomorrow, as tolerated.  Use caution in moving about as you may experience mild leg weakness.  Use caution in cooking, use of household electrical appliances and climbing steps.  Driving: If you have received any sedation, you are not allowed to drive for 24 hours after your procedure.  Blood thinner: Restart your blood thinner 6 hours after your  procedure. (Only for those taking blood thinners)  Insulin: As soon as you can eat, you may resume your normal dosing schedule. (Only for those taking insulin)  Medications: May resume pre-procedure medications.  Do not take any drugs, other than what has been prescribed to you.  Infection prevention: Keep procedure site clean and dry.  Post-procedure Pain Diary: Extremely important that this be done correctly and accurately. Recorded information will be used to determine the next step in treatment.  Pain evaluated is that of treated area only. Do not include pain from an untreated area.  Complete every hour, on the hour, for the initial 8 hours. Set an alarm to help you do this part accurately.  Do not go to sleep and have it completed later. It will not be accurate.  Follow-up appointment: Keep your follow-up appointment after the procedure. Usually 2 weeks for most procedures. (6 weeks in the case of radiofrequency.) Bring you pain diary.   Expect:  From numbing medicine (AKA: Local Anesthetics): Numbness or decrease in pain.  Onset: Full effect within 15 minutes of injected.  Duration: It will depend on the type of local anesthetic used. On the average, 1 to 8 hours.   From steroids: Decrease in swelling or inflammation. Once inflammation is improved, relief of the pain will follow.  Onset of benefits: Depends on the amount of swelling present. The more swelling, the longer it will take for the benefits to be seen. In some cases, up to 10 days.  Duration: Steroids will stay in the system x 2 weeks. Duration of benefits will depend on multiple posibilities including persistent irritating factors.  From procedure: Some   discomfort is to be expected once the numbing medicine wears off. This should be minimal if ice and heat are applied as instructed.  Call if:  You experience numbness and weakness that gets worse with time, as opposed to wearing off.  He experience any unusual  bleeding, difficulty breathing, or loss of the ability to control your bowel and bladder. (This applies to Spinal procedures only)  You experience any redness, swelling, heat, red streaks, elevated temperature, fever, or any other signs of a possible infection.  Emergency Numbers:  Durning business hours (Monday - Thursday, 8:00 AM - 4:00 PM) (Friday, 9:00 AM - 12:00 Noon): [phone]  After hours: [phone] ____________________________________________________________________________________________   ____________________________________________________________________________________________  Preparing for Procedure with Sedation  Instructions: . Oral Intake: Do not eat or drink anything for at least 8 hours prior to your procedure. . Transportation: Public transportation is not allowed. Bring an adult driver. The driver must be physically present in our waiting room before any procedure can be started. . Physical Assistance: Bring an adult physically capable of assisting you, in the event you need help. This adult should keep you company at home for at least 6 hours after the procedure. . Blood Pressure Medicine: Take your blood pressure medicine with a sip of water the morning of the procedure. . Blood thinners: Notify our staff if you are taking any blood thinners. Depending on which one you take, there will be specific instructions on how and when to stop it. . Diabetics on insulin: Notify the staff so that you can be scheduled 1st case in the morning. If your diabetes requires high dose insulin, take only  of your normal insulin dose the morning of the procedure and notify the staff that you have done so. . Preventing infections: Shower with an antibacterial soap the morning of your procedure. . Build-up your immune system: Take 1000 mg of Vitamin C with every meal (3 times a day) the day prior to your procedure. . Antibiotics: Inform the staff if you have a condition or  reason that requires you to take antibiotics before dental procedures. . Pregnancy: If you are pregnant, call and cancel the procedure. . Sickness: If you have a cold, fever, or any active infections, call and cancel the procedure. . Arrival: You must be in the facility at least 30 minutes prior to your scheduled procedure. . Children: Do not bring children with you. . Dress appropriately: Bring dark clothing that you would not mind if they get stained. . Valuables: Do not bring any jewelry or valuables.  Procedure appointments are reserved for interventional treatments only. . No Prescription Refills. . No medication changes will be discussed during procedure appointments. . No disability issues will be discussed.  Reasons to call and reschedule or cancel your procedure: (Following these recommendations will minimize the risk of a serious complication.) . Surgeries: Avoid having procedures within 2 weeks of any surgery. (Avoid for 2 weeks before or after any surgery). . Flu Shots: Avoid having procedures within 2 weeks of a flu shots or . (Avoid for 2 weeks before or after immunizations). . Barium: Avoid having a procedure within 7-10 days after having had a radiological study involving the use of radiological contrast. (Myelograms, Barium swallow or enema study). . Heart attacks: Avoid any elective procedures or surgeries for the initial 6 months after a "Myocardial Infarction" (Heart Attack). . Blood thinners: It is imperative that you stop these medications before procedures. Let us know if you if you   take any blood thinner.  . Infection: Avoid procedures during or within two weeks of an infection (including chest colds or gastrointestinal problems). Symptoms associated with infections include: Localized redness, fever, chills, night sweats or profuse sweating, burning sensation when voiding, cough, congestion, stuffiness, runny nose, sore throat, diarrhea, nausea, vomiting, cold or Flu  symptoms, recent or current infections. It is specially important if the infection is over the area that we intend to treat. . Heart and lung problems: Symptoms that may suggest an active cardiopulmonary problem include: cough, chest pain, breathing difficulties or shortness of breath, dizziness, ankle swelling, uncontrolled high or unusually low blood pressure, and/or palpitations. If you are experiencing any of these symptoms, cancel your procedure and contact your primary care physician for an evaluation.  Remember:  Regular Business hours are:  Monday to Thursday 8:00 AM to 4:00 PM  Provider's Schedule: Tehani Mersman, MD:  Procedure days: Tuesday and Thursday 7:30 AM to 4:00 PM  Bilal Lateef, MD:  Procedure days: Monday and Wednesday 7:30 AM to 4:00 PM ____________________________________________________________________________________________    

## 2017-10-26 ENCOUNTER — Telehealth: Payer: Self-pay

## 2017-10-26 NOTE — Telephone Encounter (Signed)
Post procedure phone call.  LM 

## 2017-10-27 ENCOUNTER — Telehealth: Payer: Self-pay

## 2017-10-27 NOTE — Telephone Encounter (Signed)
He cannot get his lyrica at the pharmacy. They are saying the doctor has not authorized it. Please call.

## 2017-10-27 NOTE — Telephone Encounter (Signed)
Spoke with patient.  Dr Dossie Arbour is wanting to wean him off Gabapentin to Lyrica.  He does not have a hx of using Cymbalta is Medicaid.  Medicaid typically will not cover lyrica unless there has been a tried and failed trial with Cymbalta and Gabapentin which are preferred with Medicaid. Told him I would speak with Dr Dossie Arbour and we would let him know something. Patient verbalizes u/o information.

## 2017-11-03 NOTE — Progress Notes (Signed)
Patient's Name: Caleb [name]  MRN: [id number]  Referring Provider: Virginia Crews, MD  DOB: 1955-10-02  PCP: Virginia Crews, MD  DOS: 11/07/2017  Note by: Gaspar Cola, MD  Service setting: Ambulatory outpatient  Specialty: Interventional Pain Management  Location: [facility] (AMB) Pain Management Facility    Patient type: Established   Primary Reason(s) for Visit: Encounter for post-procedure evaluation of chronic illness with mild to moderate exacerbation CC: Foot Pain (right)  HPI  Caleb [name] is a 62 y.o. year old, male patient, who comes today for a post-procedure evaluation. He has OSA (obstructive sleep apnea); Myocardial infarction ([place]); Hypertension; Hyperlipidemia; COPD (chronic obstructive pulmonary disease) ([place]); Chronic foot pain (Primary Area of Pain) (Right); Tobacco abuse; Erectile dysfunction; CAD (coronary artery disease), native coronary artery; Chronic ankle pain (Secondary Area of Pain) (Right); Chronic thoracic back pain [facility] Area of Pain) (Midline); Chronic pain syndrome; Long term current use of opiate analgesic; Pharmacologic therapy; Disorder of skeletal system; Problems influencing health status; Elevated C-reactive protein (CRP); Elevated sed rate; 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); Spondylosis without myelopathy or radiculopathy, cervicothoracic region; Chronic musculoskeletal pain; Neurogenic foot pain (Right); DDD (degenerative disc disease), cervical; 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, 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); Cervicalgia (Bilateral) (L>R); DDD (degenerative disc disease), thoracic; Disorder of superficial peroneal nerve (Right); Chronic upper extremity pain (Left); Cervical spondylosis w/ radiculopathy; Cervical disc disorder with radiculopathy of cervical region; Chronic upper extremity weakness (Left); Abnormal nerve conduction studies (06/08/2017); 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 His primarily concern today is the Foot Pain (right)  Pain Assessment: Location: Right Foot Radiating: ankle and toes Onset: More than a month ago Duration: Chronic pain Quality: Burning, Constant("on fire") Severity: 5 /10 (subjective, self-reported pain score)  Note: Reported level is inconsistent with clinical observations. Clinically the patient looks like a 4/10 A 4/10 is viewed as "Moderately Severe" and described as impossible to ignore for more than a few minutes. With effort, patients may still be able to manage work or participate in some social activities. Very difficult to concentrate. Signs of autonomic nervous system discharge are evident: dilated pupils (mydriasis); mild sweating (diaphoresis); sleep interference. Heart rate becomes elevated (>115 bpm). Diastolic blood pressure (lower number) rises above 100 mmHg. Patients find relief in laying down and not moving.             Timing: Constant Modifying factors: meciation BP: 126/81  HR: 77  Caleb [name] comes in today for post-procedure evaluation.  The patient is still having some of the discomfort from the postoperative recovery from the radiofrequency ablation.  This recovery.  May be as long as 6 weeks.  The missed medications for this type of neurogenic pain are the membrane stabilizers (pregabalin and/or gabapentin).  We have already done an appropriate trial of gabapentin, where he reached the pharmacodynamic plateau of the gabapentin.  What this means is that no matter how much I increase the dose, his benefits are not increasing accordingly.  However, in the past the patient has used pregabalin (Lyrica), which does not have a pharmacodynamic plateau as gabapentin does.  The patient indicates that while he was taking the pregabalin he was getting  good benefit, which went away when he was switched to the gabapentin.  We are now attempting to put him back on the Lyrica, but we are  getting "stone walled" by the insurance company.  They insist that the patient needs to have a trial of the gabapentin as well as a Cymbalta trial. Cymbalta and Lyrica belong to different drug classes. Cymbalta is a selective serotonin and norepinephrine reuptake inhibitor (SNRI) antidepressant and Lyrica is an anti-epileptic drug (anticonvulsant).  On several occasions we have attempted to get the Lyrica approved, but the Chiropractor the phone, apparently knows more than I do, a board certified pain specialist, and she continues to block calls from getting the appropriate treatment to this patient.  The indications for the Cymbalta include: Generalized anxiety disorder, fibromyalgia, diabetic peripheral neuropathy, and chronic musculoskeletal pain, none of which are the problem that I am trying to treat.  This patient has a traumatic injury to his ankle with permanent nerve damage requiring a membrane stabilizer such as Lyrica, which is indicated for neuropathic pain.   Just to be clear, I am unequivocally stating that this patient needs pregabalin, not duloxetine.  However, it would seem that I will have to prescribe some Cymbalta before I can get the correct medication approved.  Once again, it is medically necessary for this patient to obtain the treatment that I have requested and not the alternatives provided by the insurance company.  Further details on both, my assessment(s), as well as the proposed treatment plan, please see below.  Post-Procedure Assessment  10/25/2017 Procedure: Therapeutic right-sided Superficial Peroneal [Fibular] &  Saphenous  Nerve Radiofrequency Ablation/Neurotomy #1  under fluoroscopic guidance and IV sedation Pre-procedure pain score:  4/10 Post-procedure pain score: 0/10 (100% relief) Influential Factors: BMI: 31.17  kg/m Intra-procedural challenges: None observed.         Assessment challenges: None detected.              Reported side-effects: None.        Post-procedural adverse reactions or complications: None reported         Sedation: Sedation provided. When no sedatives are used, the analgesic levels obtained are directly associated to the effectiveness of the local anesthetics. However, when sedation is provided, the level of analgesia obtained during the initial 1 hour following the intervention, is believed to be the result of a combination of factors. These factors may include, but are not limited to: 1. The effectiveness of the local anesthetics used. 2. The effects of the analgesic(s) and/or anxiolytic(s) used. 3. The degree of discomfort experienced by the patient at the time of the procedure. 4. The patients ability and reliability in recalling and recording the events. 5. The presence and influence of possible secondary gains and/or psychosocial factors. Reported result: Relief experienced during the 1st hour after the procedure: 100 % (Ultra-Short Term Relief)            Interpretative annotation: Clinically appropriate result. Analgesia during this period is likely to be Local Anesthetic and/or IV Sedative (Analgesic/Anxiolytic) related.          Effects of local anesthetic: The analgesic effects attained during this period are directly associated to the localized infiltration of local anesthetics and therefore cary significant diagnostic value as to the etiological location, or anatomical origin, of the pain. Expected duration of relief is directly dependent on the pharmacodynamics of the local anesthetic used. Long-acting (4-6 hours) anesthetics used.  Reported result: Relief during the next 4 to 6 hour after the procedure: 100 % (Short-Term Relief)            Interpretative annotation: Clinically appropriate result. Analgesia during this period  is likely to be Local Anesthetic-related.           Long-term benefit: Defined as the period of time past the expected duration of local anesthetics (1 hour for short-acting and 4-6 hours for long-acting). With the possible exception of prolonged sympathetic blockade from the local anesthetics, benefits during this period are typically attributed to, or associated with, other factors such as analgesic sensory neuropraxia, antiinflammatory effects, or beneficial biochemical changes provided by agents other than the local anesthetics.  Reported result: Extended relief following procedure: 100 %(one day) (Long-Term Relief)            Interpretative annotation: Clinically possible results. Good relief. No permanent benefit expected. Inflammation plays a part in the etiology to the pain.          Current benefits: Defined as reported results that persistent at this point in time.   Analgesia: 0-25 % This patient is still within his positive relative.  Where he will be experiencing more pain than usual until the radiofrequency ablation fully heals. Function: Back to baseline ROM: Back to baseline Interpretative annotation: Postoperative flareup after radiofrequency ablation. Too early to determine whether or not the procedure has provided him with any benefit.             Interpretation: Results would suggest Flareup from the radiofrequency ablation, normal during the postoperative.                  Plan:  Reevaluate after he has completed the postprocedure week #6.                Laboratory Chemistry  Inflammation Markers (CRP: Acute Phase) (ESR: Chronic Phase) Lab Results  Component Value Date   CRP 7.3 (H) 02/17/2017   ESRSEDRATE 38 (H) 02/17/2017                         Rheumatology Markers Lab Results  Component Value Date   RF <10.0 02/28/2017   ANA Negative 02/28/2017                        Renal Markers Lab Results  Component Value Date   BUN 18 05/17/2017   CREATININE 0.78 05/17/2017   BCR 22 (H) 02/17/2017   GFRAA >60  05/17/2017   GFRNONAA >60 05/17/2017                             Hepatic Markers Lab Results  Component Value Date   AST 19 02/17/2017   ALT 13 10/21/2016   ALBUMIN 4.4 02/17/2017                        Neuropathy Markers Lab Results  Component Value Date   VITAMINB12 403 02/17/2017   HIV Non Reactive 02/04/2017                        Hematology Parameters Lab Results  Component Value Date   PLT 269 05/17/2017   HGB 16.8 05/17/2017   HCT 49.5 05/17/2017                        CV Markers Lab Results  Component Value Date   TROPONINI <0.03 05/17/2017  Note: Lab results reviewed.  Recent Imaging Results   Results for orders placed in visit on 10/25/17  DG C-Arm 1-60 Min-No Report   Narrative Fluoroscopy was utilized by the requesting physician.  No radiographic  interpretation.    Interpretation Report: Fluoroscopy was used during the procedure to assist with needle guidance. The images were interpreted intraoperatively by the requesting physician.  Meds   Current Outpatient Medications:  .  albuterol (PROVENTIL HFA;VENTOLIN HFA) 108 (90 Base) MCG/ACT inhaler, Inhale 1-2 puffs every 6 (six) hours as needed into the lungs for wheezing or shortness of breath., Disp: 1 Inhaler, Rfl: 5 .  aspirin EC 81 MG tablet, Take 81 mg by mouth daily., Disp: , Rfl:  .  fluticasone (FLOVENT HFA) 220 MCG/ACT inhaler, Inhale 1 puff 2 (two) times daily into the lungs., Disp: 1 Inhaler, Rfl: 5 .  gabapentin (NEURONTIN) 300 MG capsule, Take 3 capsules (900 mg total) by mouth 4 (four) times daily., Disp: 360 capsule, Rfl: 2 .  hydrochlorothiazide (HYDRODIURIL) 25 MG tablet, TAKE 1 TABLET BY MOUTH EVERY DAY, Disp: 30 tablet, Rfl: 11 .  HYDROcodone-acetaminophen (NORCO/VICODIN) 5-325 MG tablet, Take 1 tablet by mouth every 6 (six) hours as needed for up to 7 days for severe pain. Must last 7 days., Disp: 28 tablet, Rfl: 0 .  isosorbide mononitrate (IMDUR) 30 MG 24 hr  tablet, TAKE 0.5 TABLET(S) EVERY DAY BY ORAL ROUTE IN THE MORNING., Disp: 15 tablet, Rfl: 5 .  losartan (COZAAR) 100 MG tablet, Take 1 tablet (100 mg total) by mouth daily., Disp: 30 tablet, Rfl: 5 .  [START ON 11/18/2017] methocarbamol (ROBAXIN) 750 MG tablet, Take 1 tablet (750 mg total) by mouth every 8 (eight) hours as needed for muscle spasms., Disp: 90 tablet, Rfl: 5 .  metoprolol tartrate (LOPRESSOR) 25 MG tablet, TAKE 1 TABLET BY MOUTH TWICE A DAY, Disp: 60 tablet, Rfl: 2 .  nitroGLYCERIN (NITROSTAT) 0.4 MG SL tablet, Place 1 tablet (0.4 mg total) under the tongue every 5 (five) minutes as needed for chest pain., Disp: 30 tablet, Rfl: 3 .  [START ON 11/18/2017] oxyCODONE (OXY IR/ROXICODONE) 5 MG immediate release tablet, Take 1 tablet (5 mg total) by mouth every 8 (eight) hours as needed for severe pain. Must last 30 days., Disp: 90 tablet, Rfl: 0 .  [START ON 11/18/2017] pregabalin (LYRICA) 150 MG capsule, Take 1 capsule (150 mg total) by mouth 3 (three) times daily., Disp: 90 capsule, Rfl: 5 .  rosuvastatin (CRESTOR) 40 MG tablet, TAKE 1 TABLET BY MOUTH EVERY DAY, Disp: 90 tablet, Rfl: 3 .  Secukinumab (COSENTYX, 300 MG DOSE, [place]), Inject 300 mg into the skin every 30 (thirty) days. , Disp: , Rfl:  .  tiotropium (SPIRIVA) 18 MCG inhalation capsule, Place 1 capsule (18 mcg total) daily into inhaler and inhale., Disp: 30 capsule, Rfl: 5 .  triamcinolone ointment (KENALOG) 0.5 %, Apply 1 application 2 (two) times daily topically. On psoriasis lesions, Disp: 30 g, Rfl: 2 .  DULoxetine (CYMBALTA) 20 MG capsule, Take 1 capsule (20 mg total) by mouth daily., Disp: 30 capsule, Rfl: 0 .  [START ON 12/18/2017] oxyCODONE (OXY IR/ROXICODONE) 5 MG immediate release tablet, Take 1 tablet (5 mg total) by mouth every 8 (eight) hours as needed for severe pain. Must last 30 days., Disp: 90 tablet, Rfl: 0 .  [START ON 01/17/2018] oxyCODONE (OXY IR/ROXICODONE) 5 MG immediate release tablet, Take 1 tablet (5 mg  total) by mouth every 8 (eight) hours as needed for  severe pain. Must last 30 days., Disp: 90 tablet, Rfl: 0  ROS  Constitutional: Denies any fever or chills Gastrointestinal: No reported hemesis, hematochezia, vomiting, or acute GI distress Musculoskeletal: Denies any acute onset joint swelling, redness, loss of ROM, or weakness Neurological: No reported episodes of acute onset apraxia, aphasia, dysarthria, agnosia, amnesia, paralysis, loss of coordination, or loss of consciousness  Allergies  Mr. Koppelman is allergic to lisinopril.  Bells  Drug: Mr. Roker  reports that he does not use drugs. Alcohol:  reports that he drinks about 4.0 standard drinks of alcohol per week. Tobacco:  reports that he has been smoking cigarettes. He has a 24.50 pack-year smoking history. He has never used smokeless tobacco. Medical:  has a past medical history of CHF (congestive heart failure) ([place]), Chronic foot pain, right (2015), COPD (chronic obstructive pulmonary disease) ([place]), Coronary artery disease, Hyperlipidemia, Hypertension, Myocardial infarction ([place]) (2015), and OSA on CPAP. Surgical: Mr. Kocak  has a past surgical history that includes Cardiac catheterization (2015); Lumbar laminectomy (1989, 1999); Cervical fusion (1988, 1998); Liver surgery (2015); Partial colectomy (1990); Inguinal hernia repair (Bilateral, 1975); Lithotripsy; and Colonoscopy with propofol (N/A, 02/24/2017). Family: family history includes Alzheimer's disease in his paternal grandmother; Alzheimer's disease (age of onset: 55) in his father; Breast cancer in his maternal uncle; CAD in his mother; Diabetes in his maternal grandmother; Healthy in his sister; Heart attack in his maternal uncle; Heart failure in his maternal grandmother; Heart failure (age of onset: 44) in his mother; Non-Hodgkin's lymphoma in his sister.  Constitutional Exam  General appearance: Well nourished, well developed, and well hydrated. In no apparent acute  distress Vitals:   11/07/17 1028 11/07/17 1030  BP:  126/81  Pulse: 77   Resp: 16   Temp: 98 F (36.7 C)   TempSrc: Oral   SpO2: 99%   Weight: 205 lb (93 kg)   Height: _0  (1.727 m)    BMI Assessment: Estimated body mass index is 31.17 kg/m as calculated from the following:   Height as of this encounter: _1  (1.727 m).   Weight as of this encounter: 205 lb (93 kg).  BMI interpretation table: BMI level Category Range association with higher incidence of chronic pain  <18 kg/m2 Underweight   18.5-24.9 kg/m2 Ideal body weight   25-29.9 kg/m2 Overweight Increased incidence by 20%  30-34.9 kg/m2 Obese (Class I) Increased incidence by 68%  35-39.9 kg/m2 Severe obesity (Class II) Increased incidence by 136%  >40 kg/m2 Extreme obesity (Class III) Increased incidence by 254%   Patient's current BMI Ideal Body weight  Body mass index is 31.17 kg/m. Ideal body weight: 68.4 kg (150 lb 12.7 oz) Adjusted ideal body weight: 78.2 kg (172 lb 7.6 oz)   BMI Readings from Last 4 Encounters:  11/07/17 31.17 kg/m  10/25/17 31.17 kg/m  10/19/17 31.17 kg/m  10/04/17 30.41 kg/m   Wt Readings from Last 4 Encounters:  11/07/17 205 lb (93 kg)  10/25/17 205 lb (93 kg)  10/19/17 205 lb (93 kg)  10/04/17 200 lb (90.7 kg)  Psych/Mental status: Alert, oriented x 3 (person, place, & time)       Eyes: PERLA Respiratory: No evidence of acute respiratory distress  Cervical Spine Area Exam  Skin & Axial Inspection: No masses, redness, edema, swelling, or associated skin lesions Alignment: Symmetrical Functional ROM: Unrestricted ROM      Stability: No instability detected Muscle Tone/Strength: Functionally intact. No obvious neuro-muscular anomalies detected. Sensory (Neurological): Unimpaired Palpation: No palpable  anomalies              Upper Extremity (UE) Exam    Side: Right upper extremity  Side: Left upper extremity  Skin & Extremity Inspection: Skin color, temperature, and hair  growth are WNL. No peripheral edema or cyanosis. No masses, redness, swelling, asymmetry, or associated skin lesions. No contractures.  Skin & Extremity Inspection: Skin color, temperature, and hair growth are WNL. No peripheral edema or cyanosis. No masses, redness, swelling, asymmetry, or associated skin lesions. No contractures.  Functional ROM: Unrestricted ROM          Functional ROM: Unrestricted ROM          Muscle Tone/Strength: Functionally intact. No obvious neuro-muscular anomalies detected.  Muscle Tone/Strength: Functionally intact. No obvious neuro-muscular anomalies detected.  Sensory (Neurological): Unimpaired          Sensory (Neurological): Unimpaired          Palpation: No palpable anomalies              Palpation: No palpable anomalies              Provocative Test(s):  Phalen's test: deferred Tinel's test: deferred Apley's scratch test (touch opposite shoulder):  Action 1 (Across chest): deferred Action 2 (Overhead): deferred Action 3 (LB reach): deferred   Provocative Test(s):  Phalen's test: deferred Tinel's test: deferred Apley's scratch test (touch opposite shoulder):  Action 1 (Across chest): deferred Action 2 (Overhead): deferred Action 3 (LB reach): deferred    Thoracic Spine Area Exam  Skin & Axial Inspection: No masses, redness, or swelling Alignment: Symmetrical Functional ROM: Unrestricted ROM Stability: No instability detected Muscle Tone/Strength: Functionally intact. No obvious neuro-muscular anomalies detected. Sensory (Neurological): Unimpaired Muscle strength & Tone: No palpable anomalies  Lumbar Spine Area Exam  Skin & Axial Inspection: No masses, redness, or swelling Alignment: Symmetrical Functional ROM: Unrestricted ROM       Stability: No instability detected Muscle Tone/Strength: Functionally intact. No obvious neuro-muscular anomalies detected. Sensory (Neurological): Unimpaired Palpation: No palpable anomalies       Provocative  Tests: Hyperextension/rotation test: deferred today       Lumbar quadrant test (Kemp's test): deferred today       Lateral bending test: deferred today       Patrick's Maneuver: deferred today                   FABER test: deferred today                   S-I anterior distraction/compression test: deferred today         S-I lateral compression test: deferred today         S-I Thigh-thrust test: deferred today         S-I Gaenslen's test: deferred today          Gait & Posture Assessment  Ambulation: Unassisted Gait: Relatively normal for age and body habitus Posture: WNL   Lower Extremity Exam    Side: Right lower extremity  Side: Left lower extremity  Stability: No instability observed          Stability: No instability observed          Skin & Extremity Inspection: Skin color, temperature, and hair growth are WNL. No peripheral edema or cyanosis. No masses, redness, swelling, asymmetry, or associated skin lesions. No contractures.  Skin & Extremity Inspection: Skin color, temperature, and hair growth are WNL. No peripheral edema or cyanosis. No masses,  redness, swelling, asymmetry, or associated skin lesions. No contractures.  Functional ROM: Unrestricted ROM                  Functional ROM: Unrestricted ROM                  Muscle Tone/Strength: Functionally intact. No obvious neuro-muscular anomalies detected.  Muscle Tone/Strength: Functionally intact. No obvious neuro-muscular anomalies detected.  Sensory (Neurological): Unimpaired  Sensory (Neurological): Unimpaired  Palpation: No palpable anomalies  Palpation: No palpable anomalies   Assessment  Primary Diagnosis & Pertinent Problem List: The primary encounter diagnosis was Chronic foot pain (Primary Area of Pain) (Right). Diagnoses of Chronic ankle pain (Secondary Area of Pain) (Right), Chronic thoracic back pain (Tertiary Area of Pain) (Midline), Cervicalgia (Bilateral) (L>R), DDD (degenerative disc disease), cervical,  Spondylosis without myelopathy or radiculopathy, cervical region, Cervical facet syndrome (Bilateral), Abnormal nerve conduction studies (06/08/2017), Abnormal MRI, cervical spine (08/13/2017), Chronic pain syndrome, Disorder of skeletal system, Problems influencing health status, Chronic musculoskeletal pain, and Neurogenic foot pain (Right) were also pertinent to this visit.  Status Diagnosis  Controlled Controlled Controlled 1. Chronic foot pain (Primary Area of Pain) (Right)   2. Chronic ankle pain (Secondary Area of Pain) (Right)   3. Chronic thoracic back pain [facility] Area of Pain) (Midline)   4. Cervicalgia (Bilateral) (L>R)   5. DDD (degenerative disc disease), cervical   6. Spondylosis without myelopathy or radiculopathy, cervical region   7. Cervical facet syndrome (Bilateral)   8. Abnormal nerve conduction studies (06/08/2017)   9. Abnormal MRI, cervical spine (08/13/2017)   10. Chronic pain syndrome   11. Disorder of skeletal system   12. Problems influencing health status   13. Chronic musculoskeletal pain   14. Neurogenic foot pain (Right)     Problems updated and reviewed during this visit: No problems updated. Plan of Care  Pharmacotherapy (Medications Ordered): Meds ordered this encounter  Medications  . methocarbamol (ROBAXIN) 750 MG tablet    Sig: Take 1 tablet (750 mg total) by mouth every 8 (eight) hours as needed for muscle spasms.    Dispense:  90 tablet    Refill:  5    Do not place this medication, or any other prescription from our practice, on "Automatic Refill". Patient may have prescription filled one day early if pharmacy is closed on scheduled refill date.  . pregabalin (LYRICA) 150 MG capsule    Sig: Take 1 capsule (150 mg total) by mouth 3 (three) times daily.    Dispense:  90 capsule    Refill:  5    Do not place this medication, or any other prescription from our practice, on "Automatic Refill". Patient may have prescription filled one day early  if pharmacy is closed on scheduled refill date.  Marland Kitchen oxyCODONE (OXY IR/ROXICODONE) 5 MG immediate release tablet    Sig: Take 1 tablet (5 mg total) by mouth every 8 (eight) hours as needed for severe pain. Must last 30 days.    Dispense:  90 tablet    Refill:  0    [place] STOP ACT - Not applicable. Fill one day early if pharmacy is closed on scheduled refill date. Do not fill until: 11/18/17 . Must last 30 days. To last until: 12/18/17.  Marland Kitchen oxyCODONE (OXY IR/ROXICODONE) 5 MG immediate release tablet    Sig: Take 1 tablet (5 mg total) by mouth every 8 (eight) hours as needed for severe pain. Must last 30 days.    Dispense:  90 tablet    Refill:  0    [place] STOP ACT - Not applicable. Fill one day early if pharmacy is closed on scheduled refill date. Do not fill until: 12/18/17 . Must last 30 days. To last until: 01/17/18.  Marland Kitchen oxyCODONE (OXY IR/ROXICODONE) 5 MG immediate release tablet    Sig: Take 1 tablet (5 mg total) by mouth every 8 (eight) hours as needed for severe pain. Must last 30 days.    Dispense:  90 tablet    Refill:  0    [place] STOP ACT - Not applicable. Fill one day early if pharmacy is closed on scheduled refill date. Do not fill until: 01/17/18. Must last 30 days. To last until: 02/16/18.  Marland Kitchen DULoxetine (CYMBALTA) 20 MG capsule    Sig: Take 1 capsule (20 mg total) by mouth daily.    Dispense:  30 capsule    Refill:  0   Medications administered today: Duan L. Bink had no medications administered during this visit.   Procedure Orders     Radiofrequency,Cervical  Lab Orders     Sedimentation rate     C-reactive protein Imaging Orders  No imaging studies ordered today   Referral Orders  No referral(s) requested today   Interventional management options: Planned, scheduled, and/or pending:   Today we will start a Cymbalta trial to comply with the insurance company in order for the patient to have a chance to get the Lyrica that he has already tried and knows it works. He needs  to have his Lyrica approved.  Unfortunately, the insurance company is "stonewalling" [place].  PRN: Therapeutic bilateral cervical facet RFA under fluoroscopic guidance and IV sedation, starting with the left side.   Considering:   Palliative/diagnostic right-sidedCommon Peroneal nerve block#3 Palliative right-sided common peroneal nerve RFA #2(last done on 06/21/2017) Diagnostic right ankle block Diagnosticright lumbar sympathetic block Diagnostic midlinethoracic epidural steroid injection Diagnostic bilateral thoracic facet block Possible bilateral thoracic facet RFA Diagnostic cervical epidural steroid injection Diagnostic bilateral cervical facet block #3 Possible bilateral cervical facet RFA   Palliative PRN treatment(s):   Palliative left sided cervical epidural steroid injection #4 Palliative right-sided common peroneal nerve blocks Palliative right-sided common peroneal nerve RFA #2(last done on 06/21/2017)   Provider-requested follow-up: Return in about 1 month (around 12/07/2017) for PRN Procedure: (L) C-FCT RFA #1, Med-Mgmt.  Future Appointments  Date Time Provider [place]  12/14/2017 10:00 AM Brita Romp, Dionne Bucy, MD [facility] None  01/16/2018  9:00 AM Vevelyn Francois, NP [facility] None   Primary Care Physician: Virginia Crews, MD Location: [facility] Note by: Gaspar Cola, MD Date: 11/07/2017; Time: 11:28 AM

## 2017-11-07 ENCOUNTER — Ambulatory Visit: Payer: Medicaid Other | Attending: Pain Medicine | Admitting: Pain Medicine

## 2017-11-07 ENCOUNTER — Other Ambulatory Visit: Payer: Self-pay

## 2017-11-07 ENCOUNTER — Encounter: Payer: Self-pay | Admitting: Pain Medicine

## 2017-11-07 VITALS — BP 126/81 | HR 77 | Temp 98.0°F | Resp 16 | Ht 68.0 in | Wt 205.0 lb

## 2017-11-07 DIAGNOSIS — Z888 Allergy status to other drugs, medicaments and biological substances status: Secondary | ICD-10-CM | POA: Insufficient documentation

## 2017-11-07 DIAGNOSIS — N529 Male erectile dysfunction, unspecified: Secondary | ICD-10-CM | POA: Diagnosis not present

## 2017-11-07 DIAGNOSIS — F17210 Nicotine dependence, cigarettes, uncomplicated: Secondary | ICD-10-CM | POA: Diagnosis not present

## 2017-11-07 DIAGNOSIS — Z9049 Acquired absence of other specified parts of digestive tract: Secondary | ICD-10-CM | POA: Diagnosis not present

## 2017-11-07 DIAGNOSIS — J449 Chronic obstructive pulmonary disease, unspecified: Secondary | ICD-10-CM | POA: Diagnosis not present

## 2017-11-07 DIAGNOSIS — M79602 Pain in left arm: Secondary | ICD-10-CM | POA: Diagnosis not present

## 2017-11-07 DIAGNOSIS — M47892 Other spondylosis, cervical region: Secondary | ICD-10-CM | POA: Diagnosis not present

## 2017-11-07 DIAGNOSIS — M792 Neuralgia and neuritis, unspecified: Secondary | ICD-10-CM

## 2017-11-07 DIAGNOSIS — I509 Heart failure, unspecified: Secondary | ICD-10-CM | POA: Insufficient documentation

## 2017-11-07 DIAGNOSIS — M542 Cervicalgia: Secondary | ICD-10-CM | POA: Diagnosis not present

## 2017-11-07 DIAGNOSIS — G4733 Obstructive sleep apnea (adult) (pediatric): Secondary | ICD-10-CM | POA: Insufficient documentation

## 2017-11-07 DIAGNOSIS — Z79899 Other long term (current) drug therapy: Secondary | ICD-10-CM | POA: Insufficient documentation

## 2017-11-07 DIAGNOSIS — I2510 Atherosclerotic heart disease of native coronary artery without angina pectoris: Secondary | ICD-10-CM | POA: Diagnosis not present

## 2017-11-07 DIAGNOSIS — M546 Pain in thoracic spine: Secondary | ICD-10-CM | POA: Diagnosis not present

## 2017-11-07 DIAGNOSIS — M899 Disorder of bone, unspecified: Secondary | ICD-10-CM | POA: Diagnosis not present

## 2017-11-07 DIAGNOSIS — M7918 Myalgia, other site: Secondary | ICD-10-CM | POA: Diagnosis not present

## 2017-11-07 DIAGNOSIS — Z7982 Long term (current) use of aspirin: Secondary | ICD-10-CM | POA: Insufficient documentation

## 2017-11-07 DIAGNOSIS — Z5181 Encounter for therapeutic drug level monitoring: Secondary | ICD-10-CM | POA: Diagnosis not present

## 2017-11-07 DIAGNOSIS — G8929 Other chronic pain: Secondary | ICD-10-CM

## 2017-11-07 DIAGNOSIS — Z8249 Family history of ischemic heart disease and other diseases of the circulatory system: Secondary | ICD-10-CM | POA: Diagnosis not present

## 2017-11-07 DIAGNOSIS — I110 Hypertensive heart disease with heart failure: Secondary | ICD-10-CM | POA: Insufficient documentation

## 2017-11-07 DIAGNOSIS — R94130 Abnormal response to nerve stimulation, unspecified: Secondary | ICD-10-CM

## 2017-11-07 DIAGNOSIS — Z79891 Long term (current) use of opiate analgesic: Secondary | ICD-10-CM | POA: Insufficient documentation

## 2017-11-07 DIAGNOSIS — M79671 Pain in right foot: Secondary | ICD-10-CM | POA: Diagnosis not present

## 2017-11-07 DIAGNOSIS — Z789 Other specified health status: Secondary | ICD-10-CM | POA: Diagnosis not present

## 2017-11-07 DIAGNOSIS — M4802 Spinal stenosis, cervical region: Secondary | ICD-10-CM | POA: Insufficient documentation

## 2017-11-07 DIAGNOSIS — M25571 Pain in right ankle and joints of right foot: Secondary | ICD-10-CM | POA: Diagnosis not present

## 2017-11-07 DIAGNOSIS — G894 Chronic pain syndrome: Secondary | ICD-10-CM

## 2017-11-07 DIAGNOSIS — M47893 Other spondylosis, cervicothoracic region: Secondary | ICD-10-CM | POA: Diagnosis not present

## 2017-11-07 DIAGNOSIS — E785 Hyperlipidemia, unspecified: Secondary | ICD-10-CM | POA: Diagnosis not present

## 2017-11-07 DIAGNOSIS — M5030 Other cervical disc degeneration, unspecified cervical region: Secondary | ICD-10-CM

## 2017-11-07 DIAGNOSIS — M47812 Spondylosis without myelopathy or radiculopathy, cervical region: Secondary | ICD-10-CM

## 2017-11-07 DIAGNOSIS — I252 Old myocardial infarction: Secondary | ICD-10-CM | POA: Diagnosis not present

## 2017-11-07 DIAGNOSIS — R937 Abnormal findings on diagnostic imaging of other parts of musculoskeletal system: Secondary | ICD-10-CM | POA: Diagnosis not present

## 2017-11-07 MED ORDER — METHOCARBAMOL 750 MG PO TABS: 750.0000 mg | ORAL_TABLET | Freq: Three times a day (TID) | ORAL | 5 refills | Status: DC | PRN

## 2017-11-07 MED ORDER — PREGABALIN 150 MG PO CAPS: 150.0000 mg | ORAL_CAPSULE | Freq: Three times a day (TID) | ORAL | 5 refills | Status: DC

## 2017-11-07 MED ORDER — OXYCODONE HCL 5 MG PO TABS: 5.0000 mg | ORAL_TABLET | Freq: Three times a day (TID) | ORAL | 0 refills | Status: DC | PRN

## 2017-11-07 MED ORDER — DULOXETINE HCL 20 MG PO CPEP: 20.0000 mg | ORAL_CAPSULE | Freq: Every day | ORAL | 0 refills | Status: DC

## 2017-11-07 NOTE — Progress Notes (Signed)
Nursing Pain Medication Assessment:  Safety precautions to be maintained throughout the outpatient stay will include: orient to surroundings, keep bed in low position, maintain call bell within reach at all times, provide assistance with transfer out of bed and ambulation.  Medication Inspection Compliance: Mr. Wyss did not comply with our request to bring his pills to be counted. He was reminded that bringing the medication bottles, even when empty, is a requirement.  Medication: None brought in. Pill/Patch Count: None available to be counted. Bottle Appearance: No container available. Did not bring bottle(s) to appointment. Filled Date: N/A Last Medication intake:  Today

## 2017-11-07 NOTE — Patient Instructions (Addendum)
___________________________________________________________________________________________  Post-Radiofrequency (RF) Discharge Instructions  You have just completed a Radiofrequency Neurotomy.  The following instructions will provide you with information and guidelines for self-care upon discharge.  If at any time you have questions or concerns please call your physician. DO NOT DRIVE YOURSELF!!  Instructions:  Apply ice: Fill a plastic sandwich bag with crushed ice. Cover it with a small towel and apply to injection site. Apply for 15 minutes then remove x 15 minutes. Repeat sequence on day of procedure, until you go to bed. The purpose is to minimize swelling and discomfort after procedure.  Apply heat: Apply heat to procedure site starting the day following the procedure. The purpose is to treat any soreness and discomfort from the procedure.  Food intake: No eating limitations, unless stipulated above.  Nevertheless, if you have had sedation, you may experience some nausea.  In this case, it may be wise to wait at least two hours prior to resuming regular diet.  Physical activities: Keep activities to a minimum for the first 8 hours after the procedure. For the first 24 hours after the procedure, do not drive a motor vehicle,  Operate heavy machinery, power tools, or handle any weapons.  Consider walking with the use of an assistive device or accompanied by an adult for the first 24 hours.  Do not drink alcoholic beverages including beer.  Do not make any important decisions or sign any legal documents. Go home and rest today.  Resume activities tomorrow, as tolerated.  Use caution in moving about as you may experience mild leg weakness.  Use caution in cooking, use of household electrical appliances and climbing steps.  Driving: If you have received any sedation, you are not allowed to drive for 24 hours after your procedure.  Blood thinner: Restart your blood thinner 6 hours after your  procedure. (Only for those taking blood thinners)  Insulin: As soon as you can eat, you may resume your normal dosing schedule. (Only for those taking insulin)  Medications: May resume pre-procedure medications.  Do not take any drugs, other than what has been prescribed to you.  Infection prevention: Keep procedure site clean and dry.  Post-procedure Pain Diary: Extremely important that this be done correctly and accurately. Recorded information will be used to determine the next step in treatment.  Pain evaluated is that of treated area only. Do not include pain from an untreated area.  Complete every hour, on the hour, for the initial 8 hours. Set an alarm to help you do this part accurately.  Do not go to sleep and have it completed later. It will not be accurate.  Follow-up appointment: Keep your follow-up appointment after the procedure. Usually 2 weeks for most procedures. (6 weeks in the case of radiofrequency.) Bring you pain diary.   Expect:  From numbing medicine (AKA: Local Anesthetics): Numbness or decrease in pain.  Onset: Full effect within 15 minutes of injected.  Duration: It will depend on the type of local anesthetic used. On the average, 1 to 8 hours.   From steroids: Decrease in swelling or inflammation. Once inflammation is improved, relief of the pain will follow.  Onset of benefits: Depends on the amount of swelling present. The more swelling, the longer it will take for the benefits to be seen. In some cases, up to 10 days.  Duration: Steroids will stay in the system x 2 weeks. Duration of benefits will depend on multiple posibilities including persistent irritating factors.  From procedure: Some   discomfort is to be expected once the numbing medicine wears off. This should be minimal if ice and heat are applied as instructed.  Call if:  You experience numbness and weakness that gets worse with time, as opposed to wearing off.  He experience any unusual  bleeding, difficulty breathing, or loss of the ability to control your bowel and bladder. (This applies to Spinal procedures only)  You experience any redness, swelling, heat, red streaks, elevated temperature, fever, or any other signs of a possible infection.  Emergency Numbers:  [place] business hours (Monday - Thursday, 8:00 AM - 4:00 PM) (Friday, 9:00 AM - 12:00 Noon): [phone]  After hours: [phone] ____________________________________________________________________________________________    ____________________________________________________________________________________________  CANNABIDIOL (AKA: CBD Oil or Pills)  Applies to: All patients receiving prescriptions of controlled substances (written and/or electronic).  General Information: Cannabidiol (CBD) was discovered in 1. It is one of some 113 identified cannabinoids in cannabis (Marijuana) plants, accounting for up to 40% of the plant's extract. As of 2018, preliminary clinical research on cannabidiol included studies of anxiety, cognition, movement disorders, and pain.  Cannabidiol is consummed in multiple ways, including inhalation of cannabis smoke or vapor, as an aerosol spray into the cheek, and by mouth. It may be supplied as CBD oil containing CBD as the active ingredient (no added tetrahydrocannabinol (THC) or terpenes), a full-plant CBD-dominant hemp extract oil, capsules, dried cannabis, or as a liquid solution. CBD is thought not have the same psychoactivity as THC, and may affect the actions of THC. Studies suggest that CBD may interact with different biological targets, including cannabinoid receptors and other neurotransmitter receptors. As of 2018 the mechanism of action for its biological effects has not been determined.  In the [place], cannabidiol has a limited approval by the Food and Drug Administration (FDA) for treatment of only two types of epilepsy disorders. The side effects of  long-term use of the drug include somnolence, decreased appetite, diarrhea, fatigue, malaise, weakness, sleeping problems, and others.  CBD remains a Schedule I drug prohibited for any use.  Legality: Some manufacturers ship CBD products nationally, an illegal action which the FDA has not enforced in 2018, with CBD remaining the subject of an FDA investigational new drug evaluation, and is not considered legal as a dietary supplement or food ingredient as of December 2018. Federal illegality has made it difficult historically to conduct research on CBD. CBD is openly sold in head shops and health food stores in some states where such sales have not been explicitly legalized.  Warning: Because it is not FDA approved for general use or treatment of pain, it is not required to undergo the same manufacturing controls as prescription drugs.  This means that the available cannabidiol (CBD) may be contaminated with THC.  If this is the case, it will trigger a positive urine drug screen (UDS) test for cannabinoids (Marijuana).  Because a positive UDS for illicit substances is a violation of our medication agreement, your opioid analgesics (pain medicine) may be permanently discontinued. (Last update: 03/24/2017) ____________________________________________________________________________________________  ____________________________________________________________________________________________  CANNABIDIOL (AKA: CBD Oil or Pills)  Applies to: All patients receiving prescriptions of controlled substances (written and/or electronic).  General Information: Cannabidiol (CBD) was discovered in 25. It is one of some 113 identified cannabinoids in cannabis (Marijuana) plants, accounting for up to 40% of the plant's extract. As of 2018, preliminary clinical research on cannabidiol included studies of anxiety, cognition, movement disorders, and pain.  Cannabidiol is consummed in multiple ways, including  inhalation of cannabis smoke or vapor, as an aerosol spray into the cheek, and by mouth. It may be supplied as CBD oil containing CBD as the active ingredient (no added tetrahydrocannabinol (THC) or terpenes), a full-plant CBD-dominant hemp extract oil, capsules, dried cannabis, or as a liquid solution. CBD is thought not have the same psychoactivity as THC, and may affect the actions of THC. Studies suggest that CBD may interact with different biological targets, including cannabinoid receptors and other neurotransmitter receptors. As of 2018 the mechanism of action for its biological effects has not been determined.  In the [place], cannabidiol has a limited approval by the Food and Drug Administration (FDA) for treatment of only two types of epilepsy disorders. The side effects of long-term use of the drug include somnolence, decreased appetite, diarrhea, fatigue, malaise, weakness, sleeping problems, and others.  CBD remains a Schedule I drug prohibited for any use.  Legality: Some manufacturers ship CBD products nationally, an illegal action which the FDA has not enforced in 2018, with CBD remaining the subject of an FDA investigational new drug evaluation, and is not considered legal as a dietary supplement or food ingredient as of December 2018. Federal illegality has made it difficult historically to conduct research on CBD. CBD is openly sold in head shops and health food stores in some states where such sales have not been explicitly legalized.  Warning: Because it is not FDA approved for general use or treatment of pain, it is not required to undergo the same manufacturing controls as prescription drugs.  This means that the available cannabidiol (CBD) may be contaminated with THC.  If this is the case, it will trigger a positive urine drug screen (UDS) test for cannabinoids (Marijuana).  Because a positive UDS for illicit substances is a violation of our medication agreement, your opioid  analgesics (pain medicine) may be permanently discontinued. (Last update: 03/24/2017) ____________________________________________________________________________________________  Oxycodone 5 mg x 3 months to begin filling on 11/18/17 escribed to pharmacy  cymbalta escribed.   Robaxin 750 mg x 6 months escribed  Lab work ordered.

## 2017-11-08 LAB — SEDIMENTATION RATE: Sed Rate: 26 mm/hr (ref 0–30)

## 2017-11-08 LAB — C-REACTIVE PROTEIN: CRP: 8 mg/L (ref 0–10)

## 2017-11-15 ENCOUNTER — Ambulatory Visit: Payer: Medicaid Other | Admitting: Pain Medicine

## 2017-12-02 ENCOUNTER — Other Ambulatory Visit: Payer: Self-pay | Admitting: Pain Medicine

## 2017-12-02 DIAGNOSIS — M792 Neuralgia and neuritis, unspecified: Secondary | ICD-10-CM

## 2017-12-02 DIAGNOSIS — M25571 Pain in right ankle and joints of right foot: Principal | ICD-10-CM

## 2017-12-02 DIAGNOSIS — G8929 Other chronic pain: Secondary | ICD-10-CM

## 2017-12-12 ENCOUNTER — Other Ambulatory Visit: Payer: Self-pay | Admitting: Family Medicine

## 2017-12-14 ENCOUNTER — Encounter: Payer: Self-pay | Admitting: Family Medicine

## 2017-12-14 ENCOUNTER — Ambulatory Visit (INDEPENDENT_AMBULATORY_CARE_PROVIDER_SITE_OTHER): Payer: Medicaid Other | Admitting: Family Medicine

## 2017-12-14 VITALS — BP 114/76 | HR 72 | Temp 97.7°F | Wt 199.0 lb

## 2017-12-14 DIAGNOSIS — Z0000 Encounter for general adult medical examination without abnormal findings: Secondary | ICD-10-CM

## 2017-12-14 DIAGNOSIS — D72829 Elevated white blood cell count, unspecified: Secondary | ICD-10-CM

## 2017-12-14 DIAGNOSIS — E7800 Pure hypercholesterolemia, unspecified: Secondary | ICD-10-CM | POA: Diagnosis not present

## 2017-12-14 DIAGNOSIS — Z720 Tobacco use: Secondary | ICD-10-CM

## 2017-12-14 DIAGNOSIS — I10 Essential (primary) hypertension: Secondary | ICD-10-CM

## 2017-12-14 DIAGNOSIS — J449 Chronic obstructive pulmonary disease, unspecified: Secondary | ICD-10-CM | POA: Diagnosis not present

## 2017-12-14 DIAGNOSIS — Z23 Encounter for immunization: Secondary | ICD-10-CM

## 2017-12-14 DIAGNOSIS — G4733 Obstructive sleep apnea (adult) (pediatric): Secondary | ICD-10-CM

## 2017-12-14 DIAGNOSIS — G894 Chronic pain syndrome: Secondary | ICD-10-CM
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
## 2017-12-14 NOTE — Patient Instructions (Signed)

## 2017-12-15 LAB — CBC WITH DIFFERENTIAL/PLATELET
Basophils Absolute: 0.1 10*3/uL (ref 0.0–0.2)
Basos: 0 %
EOS (ABSOLUTE): 0.1 10*3/uL (ref 0.0–0.4)
Eos: 0 %
Hematocrit: 45.4 % (ref 37.5–51.0)
Hemoglobin: 15.3 g/dL (ref 13.0–17.7)
Immature Grans (Abs): 0.1 10*3/uL (ref 0.0–0.1)
Immature Granulocytes: 1 %
Lymphocytes Absolute: 3.4 10*3/uL — ABNORMAL HIGH (ref 0.7–3.1)
Lymphs: 22 %
MCH: 29.7 pg (ref 26.6–33.0)
MCHC: 33.7 g/dL (ref 31.5–35.7)
MCV: 88 fL (ref 79–97)
Monocytes Absolute: 1.2 10*3/uL — ABNORMAL HIGH (ref 0.1–0.9)
Monocytes: 8 %
Neutrophils Absolute: 10.6 10*3/uL — ABNORMAL HIGH (ref 1.4–7.0)
Neutrophils: 69 %
Platelets: 328 10*3/uL (ref 150–450)
RBC: 5.16 x10E6/uL (ref 4.14–5.80)
RDW: 12.6 % (ref 12.3–15.4)
WBC: 15.3 10*3/uL — ABNORMAL HIGH (ref 3.4–10.8)

## 2017-12-15 LAB — COMPREHENSIVE METABOLIC PANEL
ALT: 18 IU/L (ref 0–44)
AST: 22 IU/L (ref 0–40)
Albumin/Globulin Ratio: 1.4 (ref 1.2–2.2)
Albumin: 4.4 g/dL (ref 3.6–4.8)
Alkaline Phosphatase: 82 IU/L (ref 39–117)
BUN/Creatinine Ratio: 22 (ref 10–24)
BUN: 17 mg/dL (ref 8–27)
Bilirubin Total: 0.3 mg/dL (ref 0.0–1.2)
CO2: 24 mmol/L (ref 20–29)
Calcium: 9.7 mg/dL (ref 8.6–10.2)
Chloride: 97 mmol/L (ref 96–106)
Creatinine, Ser: 0.78 mg/dL (ref 0.76–1.27)
GFR calc Af Amer: 113 mL/min/{1.73_m2} (ref >59)
GFR calc non Af Amer: 98 mL/min/{1.73_m2} (ref >59)
Globulin, Total: 3.1 g/dL (ref 1.5–4.5)
Glucose: 81 mg/dL (ref 65–99)
Potassium: 4 mmol/L (ref 3.5–5.2)
Sodium: 139 mmol/L (ref 134–144)
Total Protein: 7.5 g/dL (ref 6.0–8.5)

## 2017-12-15 LAB — LIPID PANEL
Chol/HDL Ratio: 3.2 ratio (ref 0.0–5.0)
Cholesterol, Total: 126 mg/dL (ref 100–199)
HDL: 40 mg/dL (ref >39)
LDL Calculated: 54 mg/dL (ref 0–99)
Triglycerides: 159 mg/dL — ABNORMAL HIGH (ref 0–149)
VLDL Cholesterol Cal: 32 mg/dL (ref 5–40)

## 2017-12-16 ENCOUNTER — Telehealth: Payer: Self-pay

## 2017-12-16 DIAGNOSIS — D72829 Elevated white blood cell count, unspecified: Secondary | ICD-10-CM

## 2017-12-16 NOTE — Telephone Encounter (Signed)
-----   Message from Virginia Crews, MD sent at 12/16/2017  8:38 AM EST ----- Cholesterol is well controlled.  Normal kidney function, liver function, electrolytes.  WBC is elevated, chronically and increasing.  Will refer to Hematology is patient agrees for further workup

## 2017-12-16 NOTE — Assessment & Plan Note (Signed)
Chronic Fairly well-controlled No signs of exacerbation Discussed importance of regular use of controller medications Continue Spiriva, Flovent, albuterol as needed

## 2017-12-16 NOTE — Assessment & Plan Note (Signed)
Previously well controlled Continue Crestor at current dose Recheck CMP and fasting lipid panel

## 2017-12-16 NOTE — Assessment & Plan Note (Addendum)
Well-controlled Continue current medications Encourage DASH diet and regular exercise  check CMP

## 2017-12-16 NOTE — Telephone Encounter (Signed)
Patient advised. He agreed to proceed with Hematology referral. Referral ordered.

## 2017-12-16 NOTE — Assessment & Plan Note (Signed)
Patient previously had good compliance with CPAP, but has not recently due to dryness from using the machine Encouraged humidifier in the room as well He was having good relief of symptoms when using CPAP

## 2017-12-16 NOTE — Assessment & Plan Note (Signed)
Chronic 3 to 5-minute discussion regarding the harmful effects of smoking, the benefits of cessation, and his chronic lung disease He would like to try cutting back, but he has been unsuccessful at this thus far He tried Chantix in the past, but reports he did not have good success and he reached the insurance maximum of coverage for this We could consider Wellbutrin in the future if he is interested in medication

## 2017-12-16 NOTE — Assessment & Plan Note (Signed)
Patient with multiple issues contributing to chronic pain syndrome He is followed by pain management He is having difficulty with his recent medication changes Encouraged him to speak with pain management about the side effects

## 2017-12-19 ENCOUNTER — Encounter: Payer: Medicaid Other | Admitting: Nurse Practitioner

## 2017-12-21 ENCOUNTER — Ambulatory Visit: Payer: Medicaid Other | Attending: Nurse Practitioner | Admitting: Nurse Practitioner

## 2017-12-21 ENCOUNTER — Encounter: Payer: Self-pay | Admitting: Nurse Practitioner

## 2017-12-21 ENCOUNTER — Telehealth: Payer: Self-pay

## 2017-12-21 VITALS — BP 120/81 | HR 75 | Temp 97.6°F | Resp 16 | Ht 68.0 in | Wt 199.0 lb

## 2017-12-21 DIAGNOSIS — I10 Essential (primary) hypertension: Secondary | ICD-10-CM | POA: Insufficient documentation

## 2017-12-21 DIAGNOSIS — M792 Neuralgia and neuritis, unspecified: Secondary | ICD-10-CM | POA: Diagnosis not present

## 2017-12-21 DIAGNOSIS — Z5181 Encounter for therapeutic drug level monitoring: Secondary | ICD-10-CM | POA: Diagnosis present

## 2017-12-21 DIAGNOSIS — Z888 Allergy status to other drugs, medicaments and biological substances status: Secondary | ICD-10-CM | POA: Insufficient documentation

## 2017-12-21 DIAGNOSIS — I252 Old myocardial infarction: Secondary | ICD-10-CM | POA: Diagnosis not present

## 2017-12-21 DIAGNOSIS — Z79891 Long term (current) use of opiate analgesic: Secondary | ICD-10-CM | POA: Diagnosis not present

## 2017-12-21 DIAGNOSIS — G8929 Other chronic pain: Secondary | ICD-10-CM | POA: Diagnosis not present

## 2017-12-21 DIAGNOSIS — F17210 Nicotine dependence, cigarettes, uncomplicated: Secondary | ICD-10-CM | POA: Insufficient documentation

## 2017-12-21 DIAGNOSIS — Z79899 Other long term (current) drug therapy: Secondary | ICD-10-CM | POA: Insufficient documentation

## 2017-12-21 DIAGNOSIS — I2510 Atherosclerotic heart disease of native coronary artery without angina pectoris: Secondary | ICD-10-CM | POA: Diagnosis not present

## 2017-12-21 DIAGNOSIS — M25571 Pain in right ankle and joints of right foot: Secondary | ICD-10-CM

## 2017-12-21 DIAGNOSIS — M47812 Spondylosis without myelopathy or radiculopathy, cervical region: Secondary | ICD-10-CM

## 2017-12-21 DIAGNOSIS — Z7982 Long term (current) use of aspirin: Secondary | ICD-10-CM | POA: Diagnosis not present

## 2017-12-21 DIAGNOSIS — M7918 Myalgia, other site: Secondary | ICD-10-CM | POA: Diagnosis not present

## 2017-12-21 DIAGNOSIS — M4802 Spinal stenosis, cervical region: Secondary | ICD-10-CM | POA: Diagnosis not present

## 2017-12-21 DIAGNOSIS — J449 Chronic obstructive pulmonary disease, unspecified: Secondary | ICD-10-CM | POA: Diagnosis not present

## 2017-12-21 DIAGNOSIS — G4733 Obstructive sleep apnea (adult) (pediatric): Secondary | ICD-10-CM | POA: Diagnosis not present

## 2017-12-21 DIAGNOSIS — M79671 Pain in right foot: Secondary | ICD-10-CM | POA: Diagnosis not present

## 2017-12-21 DIAGNOSIS — E785 Hyperlipidemia, unspecified: Secondary | ICD-10-CM | POA: Diagnosis not present

## 2017-12-21 DIAGNOSIS — G8918 Other acute postprocedural pain: Secondary | ICD-10-CM | POA: Diagnosis not present

## 2017-12-21 DIAGNOSIS — G894 Chronic pain syndrome: Secondary | ICD-10-CM | POA: Diagnosis not present

## 2017-12-21 MED ORDER — PREGABALIN 25 MG PO CAPS: 25.0000 mg | ORAL_CAPSULE | Freq: Three times a day (TID) | ORAL | 0 refills | Status: DC

## 2017-12-21 MED ORDER — HYDROCODONE-ACETAMINOPHEN 5-325 MG PO TABS: 1.0000 | ORAL_TABLET | Freq: Four times a day (QID) | ORAL | 0 refills | Status: DC | PRN

## 2017-12-21 NOTE — Progress Notes (Signed)
Patient's Name: Caleb [name]  MRN: [id number]  Referring Provider: Virginia Crews, MD  DOB: 1955-03-27  PCP: Virginia Crews, MD  DOS: 12/21/2017  Note by: Vevelyn Francois NP  Service setting: Ambulatory outpatient  Specialty: Interventional Pain Management  Location: [facility] (AMB) Pain Management Facility    Patient type: Established    Primary Reason(s) for Visit: Encounter for prescription drug management. (Level of risk: moderate)  CC: Foot Pain (right) and Neck Pain (left )  HPI  Caleb [name] is a 62 y.o. year old, male patient, who comes today for a medication management evaluation. He has OSA (obstructive sleep apnea); Myocardial infarction ([place]); Hypertension; Hyperlipidemia; COPD (chronic obstructive pulmonary disease) ([place]); Chronic foot pain (Primary Area of Pain) (Right); Tobacco abuse; Erectile dysfunction; CAD (coronary artery disease), native coronary artery; Chronic ankle pain (Secondary Area of Pain) (Right); Chronic thoracic back pain [facility] Area of Pain) (Midline); Chronic pain syndrome; Long term current use of opiate analgesic; Pharmacologic therapy; Disorder of skeletal system; Problems influencing health status; Elevated C-reactive protein (CRP); Elevated sed rate; 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); Spondylosis without myelopathy or radiculopathy, cervicothoracic region; Chronic musculoskeletal pain; Neurogenic foot pain (Right); DDD (degenerative disc disease), cervical; 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, 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); Cervicalgia (Bilateral) (L>R); DDD (degenerative disc disease), thoracic; Disorder of superficial peroneal nerve (Right); Chronic upper extremity pain (Left); Cervical spondylosis w/ radiculopathy; Cervical disc disorder with radiculopathy of cervical region; Chronic upper extremity weakness (Left); Abnormal nerve conduction studies (06/08/2017); 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 His primarily concern today is the Foot Pain (right) and Neck Pain (left )  Pain Assessment: Location: Lower, Right Neck(foot pain on right ) Radiating: neck pain going down left arm.  foot pain into ankle and toes  Onset: More than a month ago Duration: Chronic pain Quality: Discomfort, Burning, Constant Severity: 4 /10 (subjective, self-reported pain score)  Note: Reported level is compatible with observation.                          Effect on ADL: patient feels that medication is causing him SOB.  has stopped using CPAP at night  Timing: Constant Modifying factors: nothing is helping currently.  patient states he is not taking medication the way he is supposed to.  BP: 120/81  HR: 75  Caleb [name] was last scheduled for an appointment on 09/19/2017 for medication management. During today's appointment we reviewed Caleb [name] chronic pain status, as well as his outpatient medication regimen. He admits that missed his apt on Monday. He admits that this medication has him all screwed up. He  was on Lyrica 25 mg BID in the past but it caused him to retain fluid but was effective. He was then denied it by insurance and started on Gabapentin.  He admits with the change in his medication from tramadol to oxycodone along with the Robaxin that he is become very irritable.  He has had episodes where he could not breathe despite his use of his CPAP.  He has followed up with his primary care doctor she feels it was related to the medication change.  He admits that the tramadol was changed because it was not effective.  He has been taking the Robaxin 750 mg 3 times daily.  After seeing his  primary care provider he started reducing this to 375 mg 3 times daily. The oxycodone is too strong and after his  radiofrequency when he was given the hydrocodone for as needed use that this was better.  He would rather be on the hydrocodone.  He admits that he was star 3 times daily.  He feels that this was too strong despite him being at the maximum dose of gabapentin 3200 mg/day.  He admits that his pain is back in all areas.  The neck pain seems to be the worse.  He has been using ice and heat.  He would like to see if he could change the medications a little to see if this helps before he proceeds to the cervical RFA.  The patient  reports no history of drug use. His body mass index is 30.26 kg/m.  Further details on both, my assessment(s), as well as the proposed treatment plan, please see below.  Controlled Substance Pharmacotherapy Assessment REMS (Risk Evaluation and Mitigation Strategy)  Analgesic: Oxycodone 5 mg 3 times daily MME/day: 22.5 mg/day.   Janett Billow, RN  12/21/2017 12:31 PM  Sign when Signing Visit Nursing Pain Medication Assessment:  Safety precautions to be maintained throughout the outpatient stay will include: orient to surroundings, keep bed in low position, maintain call bell within reach at all times, provide assistance with transfer out of bed and ambulation.  Medication Inspection Compliance: Pill count conducted under aseptic conditions, in front of the patient. Neither the pills nor the bottle was removed from the patient's sight at any time. Once count was completed pills were immediately returned to the patient in their original bottle.  Medication: Oxycodone IR Pill/Patch Count: 23 of 90 pills remain Pill/Patch Appearance: Markings consistent with prescribed medication Bottle Appearance: Standard pharmacy container. Clearly labeled. Filled Date: 47 / 17 / 2019 Last Medication intake:  Today   Wasted today in controlled substance bin. Witnessed by Angelique Holm RN and Patient.    Pharmacokinetics: Liberation and absorption (onset of action):  WNL Distribution (time to peak effect): WNL Metabolism and excretion (duration of action): WNL         Pharmacodynamics: Desired effects: Analgesia: Caleb [name] reports >50% benefit. Functional ability: Patient reports that medication allows him to accomplish basic ADLs Clinically meaningful improvement in function (CMIF): Sustained CMIF goals met Perceived effectiveness: Described as relatively effective, allowing for increase in activities of daily living (ADL) Undesirable effects: Side-effects or Adverse reactions: Irritable, depressed  Monitoring: [place] PMP: Online review of the past 17-monthperiod conducted. Compliant with practice rules and regulations Last UDS on record: Summary  Date Value Ref Range Status  02/17/2017 FINAL  Final    Comment:    ==================================================================== TOXASSURE COMP DRUG ANALYSIS,UR ==================================================================== Test                             Result       Flag       Units Drug Present and Declared for Prescription Verification   Tramadol                       >5556        EXPECTED   ng/mg creat   O-Desmethyltramadol            4917         EXPECTED   ng/mg creat   N-Desmethyltramadol  659          EXPECTED   ng/mg creat    Source of tramadol is a prescription medication.    O-desmethyltramadol and N-desmethyltramadol are expected    metabolites of tramadol.   Gabapentin                     PRESENT      EXPECTED   Desmethylcyclobenzaprine       PRESENT      EXPECTED    Desmethylcyclobenzaprine is an expected metabolite of    cyclobenzaprine.   Metoprolol                     PRESENT      EXPECTED Drug Absent but Declared for Prescription Verification   Salicylate                     Not Detected UNEXPECTED    Aspirin, as indicated in the declared medication list, is not    always detected even when used as  directed. ==================================================================== Test                      Result    Flag   Units      Ref Range   Creatinine              90               mg/dL      >=20 ==================================================================== Declared Medications:  The flagging and interpretation on this report are based on the  following declared medications.  Unexpected results may arise from  inaccuracies in the declared medications.  **Note: The testing scope of this panel includes these medications:  Cyclobenzaprine  Gabapentin  Metoprolol  Tramadol  **Note: The testing scope of this panel does not include small to  moderate amounts of these reported medications:  Aspirin (Aspirin 81)  **Note: The testing scope of this panel does not include following  reported medications:  Albuterol  Fluticasone  Hydrochlorothiazide  Isosorbide Mononitrate  Rosuvastatin  Sildenafil  Tiotropium  Triamcinolone acetonide  Varenicline ==================================================================== For clinical consultation, please call [phone]. ====================================================================    UDS interpretation: Compliant          Medication Assessment Form: Reviewed. Patient indicates being compliant with therapy Treatment compliance: Compliant Risk Assessment Profile: Aberrant behavior: See prior evaluations. None observed or detected today Comorbid factors increasing risk of overdose: See prior notes. No additional risks detected today Opioid risk tool (ORT) (Total Score):   Personal History of Substance Abuse (SUD-Substance use disorder):  Alcohol:    Illegal Drugs:    Rx Drugs:    ORT Risk Level calculation:   Risk of substance use disorder (SUD): Low  ORT Scoring interpretation table:  Score <3 = Low Risk for SUD  Score between 4-7 = Moderate Risk for SUD  Score >8 = High Risk for Opioid Abuse   Risk Mitigation  Strategies:  Patient Counseling: Covered Patient-Prescriber Agreement (PPA): Present and active  Notification to other healthcare providers: Done  Pharmacologic Plan: No change in therapy, at this time.             Laboratory Chemistry  Inflammation Markers (CRP: Acute Phase) (ESR: Chronic Phase) Lab Results  Component Value Date   CRP 8 11/07/2017   ESRSEDRATE 26 11/07/2017  Rheumatology Markers Lab Results  Component Value Date   RF <10.0 02/28/2017   ANA Negative 02/28/2017                        Renal Function Markers Lab Results  Component Value Date   BUN 17 12/14/2017   CREATININE 0.78 12/14/2017   BCR 22 12/14/2017   GFRAA 113 12/14/2017   GFRNONAA 98 12/14/2017                             Hepatic Function Markers Lab Results  Component Value Date   AST 22 12/14/2017   ALT 18 12/14/2017   ALBUMIN 4.4 12/14/2017   ALKPHOS 82 12/14/2017                        Electrolytes Lab Results  Component Value Date   NA 139 12/14/2017   K 4.0 12/14/2017   CL 97 12/14/2017   CALCIUM 9.7 12/14/2017   MG 2.0 02/17/2017                        Neuropathy Markers Lab Results  Component Value Date   VITAMINB12 403 02/17/2017   HIV Non Reactive 02/04/2017                        CNS Tests No results found for: COLORCSF, APPEARCSF, RBCCOUNTCSF, WBCCSF, POLYSCSF, LYMPHSCSF, EOSCSF, PROTEINCSF, GLUCCSF, JCVIRUS, CSFOLI, IGGCSF                      Bone Pathology Markers Lab Results  Component Value Date   25OHVITD1 32 02/17/2017   25OHVITD2 <1.0 02/17/2017   25OHVITD3 32 02/17/2017   TESTOFREE 5.2 (L) 02/28/2017   TESTOSTERONE 279 02/28/2017                         Coagulation Parameters Lab Results  Component Value Date   PLT 328 12/14/2017                        Cardiovascular Markers Lab Results  Component Value Date   TROPONINI <0.03 05/17/2017   HGB 15.3 12/14/2017   HCT 45.4 12/14/2017                         CA  Markers No results found for: CEA, CA125, LABCA2                      Note: Lab results reviewed.  Recent Diagnostic Imaging Results  DG C-Arm 1-60 Min-No Report Fluoroscopy was utilized by the requesting physician.  No radiographic  interpretation.   Complexity Note: Imaging results reviewed. Results shared with Caleb [name], using Layman's terms.                         Meds   Current Outpatient Medications:  .  albuterol (PROVENTIL HFA;VENTOLIN HFA) 108 (90 Base) MCG/ACT inhaler, Inhale 1-2 puffs every 6 (six) hours as needed into the lungs for wheezing or shortness of breath., Disp: 1 Inhaler, Rfl: 5 .  aspirin EC 81 MG tablet, Take 81 mg by mouth daily., Disp: , Rfl:  .  fluticasone (FLOVENT HFA) 220 MCG/ACT inhaler, Inhale 1 puff 2 (  two) times daily into the lungs., Disp: 1 Inhaler, Rfl: 5 .  hydrochlorothiazide (HYDRODIURIL) 25 MG tablet, TAKE 1 TABLET BY MOUTH EVERY DAY, Disp: 30 tablet, Rfl: 11 .  isosorbide mononitrate (IMDUR) 30 MG 24 hr tablet, TAKE 0.5 TABLET(S) EVERY DAY BY ORAL ROUTE IN THE MORNING., Disp: 15 tablet, Rfl: 5 .  losartan (COZAAR) 100 MG tablet, Take 1 tablet (100 mg total) by mouth daily., Disp: 30 tablet, Rfl: 5 .  methocarbamol (ROBAXIN) 750 MG tablet, Take 1 tablet (750 mg total) by mouth every 8 (eight) hours as needed for muscle spasms., Disp: 90 tablet, Rfl: 5 .  metoprolol tartrate (LOPRESSOR) 25 MG tablet, TAKE 1 TABLET BY MOUTH TWICE A DAY, Disp: 60 tablet, Rfl: 2 .  nitroGLYCERIN (NITROSTAT) 0.4 MG SL tablet, Place 1 tablet (0.4 mg total) under the tongue every 5 (five) minutes as needed for chest pain., Disp: 30 tablet, Rfl: 3 .  rosuvastatin (CRESTOR) 40 MG tablet, TAKE 1 TABLET BY MOUTH EVERY DAY, Disp: 90 tablet, Rfl: 3 .  Secukinumab (COSENTYX, 300 MG DOSE, [place]), Inject 300 mg into the skin every 30 (thirty) days. , Disp: , Rfl:  .  tiotropium (SPIRIVA) 18 MCG inhalation capsule, Place 1 capsule (18 mcg total) daily into inhaler and inhale., Disp:  30 capsule, Rfl: 5 .  triamcinolone ointment (KENALOG) 0.5 %, Apply 1 application 2 (two) times daily topically. On psoriasis lesions, Disp: 30 g, Rfl: 2 .  HYDROcodone-acetaminophen (NORCO/VICODIN) 5-325 MG tablet, Take 1 tablet by mouth every 6 (six) hours as needed for severe pain., Disp: 120 tablet, Rfl: 0 .  pregabalin (LYRICA) 25 MG capsule, Take 1 capsule (25 mg total) by mouth 3 (three) times daily., Disp: 90 capsule, Rfl: 0  ROS  Constitutional: Denies any fever or chills Gastrointestinal: No reported hemesis, hematochezia, vomiting, or acute GI distress Musculoskeletal: Denies any acute onset joint swelling, redness, loss of ROM, or weakness Neurological: No reported episodes of acute onset apraxia, aphasia, dysarthria, agnosia, amnesia, paralysis, loss of coordination, or loss of consciousness  Allergies  Caleb [name] is allergic to lisinopril.  [place]  Drug: Caleb [name]  reports no history of drug use. Alcohol:  reports current alcohol use of about 4.0 standard drinks of alcohol per week. Tobacco:  reports that he has been smoking cigarettes. He has a 24.50 pack-year smoking history. He has never used smokeless tobacco. Medical:  has a past medical history of CHF (congestive heart failure) ([place]), Chronic foot pain, right (2015), COPD (chronic obstructive pulmonary disease) ([place]), Coronary artery disease, Hyperlipidemia, Hypertension, Myocardial infarction ([place]) (2015), and OSA on CPAP. Surgical: Caleb [name]  has a past surgical history that includes Cardiac catheterization (2015); Lumbar laminectomy (1989, 1999); Cervical fusion (1988, 1998); Liver surgery (2015); Partial colectomy (1990); Inguinal hernia repair (Bilateral, 1975); Lithotripsy; and Colonoscopy with propofol (N/A, 02/24/2017). Family: family history includes Alzheimer's disease in his paternal grandmother; Alzheimer's disease (age of onset: 9) in his father; Breast cancer in his maternal uncle; CAD in his mother; Diabetes in  his maternal grandmother; Healthy in his sister; Heart attack in his maternal uncle; Heart failure in his maternal grandmother; Heart failure (age of onset: 82) in his mother; Non-Hodgkin's lymphoma in his sister.  Constitutional Exam  General appearance: Well nourished, well developed, and well hydrated. In no apparent acute distress Vitals:   12/21/17 1125  BP: 120/81  Pulse: 75  Resp: 16  Temp: 97.6 F (36.4 C)  TempSrc: Oral  SpO2: 97%  Weight: 199 lb (90.3  kg)  Height: _0  (1.727 m)  Psych/Mental status: Alert, oriented x 3 (person, place, & time)       Eyes: PERLA Respiratory: No evidence of acute respiratory distress  Cervical Spine Area Exam  Skin & Axial Inspection: No masses, redness, edema, swelling, or associated skin lesions Alignment: Symmetrical Functional ROM: Unrestricted ROM      Stability: No instability detected Muscle Tone/Strength: Guarding observed Sensory (Neurological): Unimpaired Palpation: Tender              Upper Extremity (UE) Exam    Side: Right upper extremity  Side: Left upper extremity  Skin & Extremity Inspection: Skin color, temperature, and hair growth are WNL. No peripheral edema or cyanosis. No masses, redness, swelling, asymmetry, or associated skin lesions. No contractures.  Skin & Extremity Inspection: Skin color, temperature, and hair growth are WNL. No peripheral edema or cyanosis. No masses, redness, swelling, asymmetry, or associated skin lesions. No contractures.  Functional ROM: Unrestricted ROM          Functional ROM: Unrestricted ROM          Muscle Tone/Strength: Functionally intact. No obvious neuro-muscular anomalies detected.  Muscle Tone/Strength: Functionally intact. No obvious neuro-muscular anomalies detected.  Sensory (Neurological): Unimpaired          Sensory (Neurological): Unimpaired          Palpation: No palpable anomalies              Palpation: No palpable anomalies               Thoracic Spine Area Exam  Skin  & Axial Inspection: No masses, redness, or swelling Alignment: Symmetrical Functional ROM: Unrestricted ROM Stability: No instability detected Muscle Tone/Strength: Functionally intact. No obvious neuro-muscular anomalies detected. Sensory (Neurological): Unimpaired Muscle strength & Tone: No palpable anomalies  Lumbar Spine Area Exam  Skin & Axial Inspection: No masses, redness, or swelling Alignment: Symmetrical Functional ROM: Unrestricted ROM       Stability: No instability detected Muscle Tone/Strength: Functionally intact. No obvious neuro-muscular anomalies detected. Sensory (Neurological): Unimpaired Palpation: No palpable anomalies        Gait & Posture Assessment  Ambulation: Patient ambulates using a cane Gait: Antalgic Posture: Antalgic   Lower Extremity Exam    Side: Right lower extremity  Side: Left lower extremity  Stability: No instability observed          Stability: No instability observed          Skin & Extremity Inspection: Evidence of prior arthroplastic surgery  Skin & Extremity Inspection: Skin color, temperature, and hair growth are WNL. No peripheral edema or cyanosis. No masses, redness, swelling, asymmetry, or associated skin lesions. No contractures.  Functional ROM: Unrestricted ROM                  Functional ROM: Unrestricted ROM                  Muscle Tone/Strength: Functionally intact. No obvious neuro-muscular anomalies detected.  Muscle Tone/Strength: Functionally intact. No obvious neuro-muscular anomalies detected.  Sensory (Neurological): Hyperalgesia (Increased sensitivity to pain)        Sensory (Neurological): Unimpaired            Palpation: No palpable anomalies  Palpation: No palpable anomalies   Assessment  Primary Diagnosis & Pertinent Problem List: The primary encounter diagnosis was Chronic ankle pain (Secondary Area of Pain) (Right). Diagnoses of Chronic foot pain (Primary Area of Pain) (Right), Spondylosis without myelopathy  or  radiculopathy, cervical region, Chronic musculoskeletal pain, Acute postoperative pain, Neurogenic foot pain (Right), and Long term prescription opiate use were also pertinent to this visit.  Status Diagnosis  Worsening Worsening Worsening 1. Chronic ankle pain (Secondary Area of Pain) (Right)   2. Chronic foot pain (Primary Area of Pain) (Right)   3. Spondylosis without myelopathy or radiculopathy, cervical region   4. Chronic musculoskeletal pain   5. Acute postoperative pain   6. Neurogenic foot pain (Right)   7. Long term prescription opiate use     Problems updated and reviewed during this visit: No problems updated. Plan of Care  Pharmacotherapy (Medications Ordered): Meds ordered this encounter  Medications  . HYDROcodone-acetaminophen (NORCO/VICODIN) 5-325 MG tablet    Sig: Take 1 tablet by mouth every 6 (six) hours as needed for severe pain.    Dispense:  120 tablet    Refill:  0    Do not place this medication, or any other prescription from our practice, on "Automatic Refill". Patient may have prescription filled one day early if pharmacy is closed on scheduled refill date.    Order Specific Question:   Supervising Provider    Answer:   Milinda Pointer [phone]  . pregabalin (LYRICA) 25 MG capsule    Sig: Take 1 capsule (25 mg total) by mouth 3 (three) times daily.    Dispense:  90 capsule    Refill:  0    Do not place this medication, or any other prescription from our practice, on "Automatic Refill". Patient may have prescription filled one day early if pharmacy is closed on scheduled refill date.    Order Specific Question:   Supervising Provider    Answer:   Milinda Pointer [phone]   New Prescriptions   PREGABALIN (LYRICA) 25 MG CAPSULE    Take 1 capsule (25 mg total) by mouth 3 (three) times daily.   Medications administered today: Caleb [name] had no medications administered during this visit. Lab-work, procedure(s), and/or referral(s): Orders  Placed This Encounter  Procedures  . ToxASSURE Select 13 (MW), Urine   Imaging and/or referral(s): None  Interventional management options: Planned, scheduled, and/or pending:   RN: Therapeutic bilateral cervical facet RFAunder fluoroscopic guidance and IV sedation, starting with the left side.   Considering:   Palliative/diagnostic right-sidedCommon Peroneal nerve block#3 Palliative right-sided common peroneal nerve RFA #2(last done on 06/21/2017) Diagnostic right ankle block Diagnosticright lumbar sympathetic block Diagnostic midlinethoracic epidural steroid injection Diagnostic bilateral thoracic facet block Possible bilateral thoracic facet RFA Diagnostic cervical epidural steroid injection Diagnostic bilateral cervical facet block#3 Possible bilateral cervical facet RFA   Palliative PRN treatment(s):   Palliative left sided cervical epidural steroid injection #4 Palliative right-sided common peroneal nerve blocks Palliative right-sided common peroneal nerve RFA #2(last done on 06/21/2017)    Provider-requested follow-up: Return in about 4 weeks (around 01/18/2018) for MedMgmt.  Future Appointments  Date Time Provider [place]  12/30/2017 10:45 AM Sindy Guadeloupe, MD [facility] None  01/12/2018  8:00 AM Vevelyn Francois, NP [facility] None  06/15/2018  9:40 AM Bacigalupo, Dionne Bucy, MD [facility] None   Primary Care Physician: Virginia Crews, MD Location: [facility] Note by: Vevelyn Francois NP Date: 12/21/2017; Time: 10:15 AM  Pain Score Disclaimer: We use the NRS-11 scale. This is a self-reported, subjective measurement of pain severity with only modest accuracy. It is used primarily to identify changes within a particular patient. It must be understood that outpatient pain scales  are significantly less accurate that those used for research, where they can be applied under ideal controlled circumstances with  minimal exposure to variables. In reality, the score is likely to be a combination of pain intensity and pain affect, where pain affect describes the degree of emotional arousal or changes in action readiness caused by the sensory experience of pain. Factors such as social and work situation, setting, emotional state, anxiety levels, expectation, and prior pain experience may influence pain perception and show large inter-individual differences that may also be affected by time variables.  Patient instructions provided during this appointment: Patient Instructions  ____________________________________________________________________________________________  Medication Rules  Purpose: To inform patients, and their family members, of our rules and regulations.  Applies to: All patients receiving prescriptions (written or electronic).  Pharmacy of record: Pharmacy where electronic prescriptions will be sent. If written prescriptions are taken to a different pharmacy, please inform the nursing staff. The pharmacy listed in the electronic medical record should be the one where you would like electronic prescriptions to be sent.  Electronic prescriptions: In compliance with the [place] (STOP) Act of 2017 (Session [place] [phone]), effective January 04, 2018, all controlled substances must be electronically prescribed. Calling prescriptions to the pharmacy will cease to exist.  Prescription refills: Only during scheduled appointments. Applies to all prescriptions.  NOTE: The following applies primarily to controlled substances (Opioid* Pain Medications).   Patient's responsibilities: 1. Pain Pills: Bring all pain pills to every appointment (except for procedure appointments). 2. Pill Bottles: Bring pills in original pharmacy bottle. Always bring the newest bottle. Bring bottle, even if empty. 3. Medication refills: You are responsible for knowing and keeping  track of what medications you take and those you need refilled. The day before your appointment: write a list of all prescriptions that need to be refilled. The day of the appointment: give the list to the admitting nurse. Prescriptions will be written only during appointments. If you forget a medication: it will not be "Called in", "Faxed", or "electronically sent". You will need to get another appointment to get these prescribed. No early refills. Do not call asking to have your prescription filled early. 4. Prescription Accuracy: You are responsible for carefully inspecting your prescriptions before leaving our office. Have the discharge nurse carefully go over each prescription with you, before taking them home. Make sure that your name is accurately spelled, that your address is correct. Check the name and dose of your medication to make sure it is accurate. Check the number of pills, and the written instructions to make sure they are clear and accurate. Make sure that you are given enough medication to last until your next medication refill appointment. 5. Taking Medication: Take medication as prescribed. When it comes to controlled substances, taking less pills or less frequently than prescribed is permitted and encouraged. Never take more pills than instructed. Never take medication more frequently than prescribed.  6. Inform other Doctors: Always inform, all of your healthcare providers, of all the medications you take. 7. Pain Medication from other Providers: You are not allowed to accept any additional pain medication from any other Doctor or Healthcare provider. There are two exceptions to this rule. (see below) In the event that you require additional pain medication, you are responsible for notifying us, as stated below. 8. Medication Agreement: You are responsible for carefully reading and following our Medication Agreement. This must be signed before receiving any prescriptions from our  practice. Safely store a  copy of your signed Agreement. Violations to the Agreement will result in no further prescriptions. (Additional copies of our Medication Agreement are available upon request.) 9. Laws, Rules, & Regulations: All patients are expected to follow all Federal and [employer], Rules, [employer] [facility]. Ignorance of the Laws does not constitute a valid excuse. The use of any illegal substances is prohibited. 10. Adopted CDC guidelines & recommendations: Target dosing levels will be at or below 60 MME/day. Use of benzodiazepines** is not recommended.  Exceptions: There are only two exceptions to the rule of not receiving pain medications from other Healthcare Providers. 1. Exception #1 (Emergencies): In the event of an emergency (i.e.: accident requiring emergency care), you are allowed to receive additional pain medication. However, you are responsible for: As soon as you are able, call our office [phone], at any time of the day or night, and leave a message stating your name, the date and nature of the emergency, and the name and dose of the medication prescribed. In the event that your call is answered by a member of our staff, make sure to document and save the date, time, and the name of the person that took your information.  2. Exception #2 (Planned Surgery): In the event that you are scheduled by another doctor or dentist to have any type of surgery or procedure, you are allowed (for a period no longer than 30 days), to receive additional pain medication, for the acute post-op pain. However, in this case, you are responsible for picking up a copy of our "Post-op Pain Management for Surgeons" handout, and giving it to your surgeon or dentist. This document is available at our office, and does not require an appointment to obtain it. Simply go to our office during business hours (Monday-Thursday from 8:00 AM to 4:00 PM) (Friday 8:00 AM to 12:00 Noon) or if you have a  scheduled appointment with [place], prior to your surgery, and ask for it by name. In addition, you will need to provide [place] with your name, name of your surgeon, type of surgery, and date of procedure or surgery.  *Opioid medications include: morphine, codeine, oxycodone, oxymorphone, hydrocodone, hydromorphone, meperidine, tramadol, tapentadol, buprenorphine, fentanyl, methadone. **Benzodiazepine medications include: diazepam (Valium), alprazolam (Xanax), clonazepam (Klonopine), lorazepam (Ativan), clorazepate (Tranxene), chlordiazepoxide (Librium), estazolam (Prosom), oxazepam (Serax), temazepam (Restoril), triazolam (Halcion) (Last updated: 03/03/2017) ____________________________________________________________________________________________   Lyrica 25 mg x 1 month escribed to pharmacy.  Hydrocodone - apap 5-325 mg x 1 month escribed to pharmacy.  To fill today on both.  Oxycodone IR qty 23 wasted in controlled substance waste bin.

## 2017-12-21 NOTE — Progress Notes (Signed)
Nursing Pain Medication Assessment:  Safety precautions to be maintained throughout the outpatient stay will include: orient to surroundings, keep bed in low position, maintain call bell within reach at all times, provide assistance with transfer out of bed and ambulation.  Medication Inspection Compliance: Pill count conducted under aseptic conditions, in front of the patient. Neither the pills nor the bottle was removed from the patient's sight at any time. Once count was completed pills were immediately returned to the patient in their original bottle.  Medication: Oxycodone IR Pill/Patch Count: 23 of 90 pills remain Pill/Patch Appearance: Markings consistent with prescribed medication Bottle Appearance: Standard pharmacy container. Clearly labeled. Filled Date: 23 / 17 / 2019 Last Medication intake:  Today   Wasted today in controlled substance bin. Witnessed by Angelique Holm RN and Patient.

## 2017-12-21 NOTE — Telephone Encounter (Signed)
PA request for Hydrocodone submitted.

## 2017-12-21 NOTE — Telephone Encounter (Signed)
Called patient about medication issue.  States he had to pay out of pocket for medication today as they were only going to give him 7 days of medication.  Patient stated he needed meds because he had given his oxycodone 5 mg back today and were wasted by myself and Angelique Holm RN.  I told him that I would send a PA and try to get them to date it today so that [facility] can rerun and get his money back.  Instructed not to throw away his receipt.

## 2017-12-21 NOTE — Telephone Encounter (Signed)
Please call him regarding his prescription. He is having issues.

## 2017-12-21 NOTE — Patient Instructions (Addendum)
____________________________________________________________________________________________  Medication Rules  Purpose: To inform patients, and their family members, of our rules and regulations.  Applies to: All patients receiving prescriptions (written or electronic).  Pharmacy of record: Pharmacy where electronic prescriptions will be sent. If written prescriptions are taken to a different pharmacy, please inform the nursing staff. The pharmacy listed in the electronic medical record should be the one where you would like electronic prescriptions to be sent.  Electronic prescriptions: In compliance with the [place] [employer]rengthen Opioid Misuse Prevention (STOP) Act of 2017 (Session Law 2017-74/H243), effective January 04, 2018, all controlled substances must be electronically prescribed. Calling prescriptions to the pharmacy will cease to exist.  Prescription refills: Only during scheduled appointments. Applies to all prescriptions.  NOTE: The following applies primarily to controlled substances (Opioid* Pain Medications).   Patient's responsibilities: 1. Pain Pills: Bring all pain pills to every appointment (except for procedure appointments). 2. Pill Bottles: Bring pills in original pharmacy bottle. Always bring the newest bottle. Bring bottle, even if empty. 3. Medication refills: You are responsible for knowing and keeping track of what medications you take and those you need refilled. The day before your appointment: write a list of all prescriptions that need to be refilled. The day of the appointment: give the list to the admitting nurse. Prescriptions will be written only during appointments. If you forget a medication: it will not be "Called in", "Faxed", or "electronically sent". You will need to get another appointment to get these prescribed. No early refills. Do not call asking to have your prescription filled early. 4. Prescription Accuracy: You are responsible for  carefully inspecting your prescriptions before leaving our office. Have the discharge nurse carefully go over each prescription with you, before taking them home. Make sure that your name is accurately spelled, that your address is correct. Check the name and dose of your medication to make sure it is accurate. Check the number of pills, and the written instructions to make sure they are clear and accurate. Make sure that you are given enough medication to last until your next medication refill appointment. 5. Taking Medication: Take medication as prescribed. When it comes to controlled substances, taking less pills or less frequently than prescribed is permitted and encouraged. Never take more pills than instructed. Never take medication more frequently than prescribed.  6. Inform other Doctors: Always inform, all of your healthcare providers, of all the medications you take. 7. Pain Medication from other Providers: You are not allowed to accept any additional pain medication from any other Doctor or Healthcare provider. There are two exceptions to this rule. (see below) In the event that you require additional pain medication, you are responsible for notifying us, as stated below. 8. Medication Agreement: You are responsible for carefully reading and following our Medication Agreement. This must be signed before receiving any prescriptions from our practice. Safely store a copy of your signed Agreement. Violations to the Agreement will result in no further prescriptions. (Additional copies of our Medication Agreement are available upon request.) 9. Laws, Rules, & Regulations: All patients are expected to follow all Federal and State Laws, Statutes, Rules, & Regulations. Ignorance of the Laws does not constitute a valid excuse. The use of any illegal substances is prohibited. 10. Adopted CDC guidelines & recommendations: Target dosing levels will be at or below 60 MME/day. Use of benzodiazepines** is not  recommended.  Exceptions: There are only two exceptions to the rule of not receiving pain medications from other Healthcare Providers. 1.   Exception #1 (Emergencies): In the event of an emergency (i.e.: accident requiring emergency care), you are allowed to receive additional pain medication. However, you are responsible for: As soon as you are able, call our office [phone], at any time of the day or night, and leave a message stating your name, the date and nature of the emergency, and the name and dose of the medication prescribed. In the event that your call is answered by a member of our staff, make sure to document and save the date, time, and the name of the person that took your information.  2. Exception #2 (Planned Surgery): In the event that you are scheduled by another doctor or dentist to have any type of surgery or procedure, you are allowed (for a period no longer than 30 days), to receive additional pain medication, for the acute post-op pain. However, in this case, you are responsible for picking up a copy of our "Post-op Pain Management for Surgeons" handout, and giving it to your surgeon or dentist. This document is available at our office, and does not require an appointment to obtain it. Simply go to our office during business hours (Monday-Thursday from 8:00 AM to 4:00 PM) (Friday 8:00 AM to 12:00 Noon) or if you have a scheduled appointment with [place], prior to your surgery, and ask for it by name. In addition, you will need to provide [place] with your name, name of your surgeon, type of surgery, and date of procedure or surgery.  *Opioid medications include: morphine, codeine, oxycodone, oxymorphone, hydrocodone, hydromorphone, meperidine, tramadol, tapentadol, buprenorphine, fentanyl, methadone. **Benzodiazepine medications include: diazepam (Valium), alprazolam (Xanax), clonazepam (Klonopine), lorazepam (Ativan), clorazepate (Tranxene), chlordiazepoxide (Librium), estazolam (Prosom),  oxazepam (Serax), temazepam (Restoril), triazolam (Halcion) (Last updated: 03/03/2017) ____________________________________________________________________________________________   Lyrica 25 mg x 1 month escribed to pharmacy.  Hydrocodone - apap 5-325 mg x 1 month escribed to pharmacy.  To fill today on both.  Oxycodone IR qty 23 wasted in controlled substance waste bin.

## 2017-12-26 LAB — TOXASSURE SELECT 13 (MW), URINE

## 2017-12-29 ENCOUNTER — Telehealth: Payer: Self-pay | Admitting: Family Medicine

## 2017-12-29 DIAGNOSIS — H6980 Other specified disorders of Eustachian tube, unspecified ear: Secondary | ICD-10-CM

## 2017-12-29 NOTE — Telephone Encounter (Signed)
Pt calling to let Dr. B know he is having trouble again with his ear.  He is wanting to go ahead with the ENT referral.  Please advise.  Thanks, [employer]

## 2017-12-30 ENCOUNTER — Inpatient Hospital Stay: Payer: Medicaid Other

## 2017-12-30 ENCOUNTER — Encounter: Payer: Self-pay | Admitting: Oncology

## 2017-12-30 ENCOUNTER — Other Ambulatory Visit: Payer: Self-pay

## 2017-12-30 ENCOUNTER — Inpatient Hospital Stay: Payer: Medicaid Other | Attending: Oncology | Admitting: Oncology

## 2017-12-30 VITALS — BP 107/68 | HR 58 | Temp 98.5°F | Resp 18 | Ht 68.0 in | Wt 198.5 lb

## 2017-12-30 DIAGNOSIS — G4733 Obstructive sleep apnea (adult) (pediatric): Secondary | ICD-10-CM | POA: Insufficient documentation

## 2017-12-30 DIAGNOSIS — E785 Hyperlipidemia, unspecified: Secondary | ICD-10-CM | POA: Diagnosis not present

## 2017-12-30 DIAGNOSIS — Z7982 Long term (current) use of aspirin: Secondary | ICD-10-CM | POA: Diagnosis not present

## 2017-12-30 DIAGNOSIS — D72829 Elevated white blood cell count, unspecified: Secondary | ICD-10-CM

## 2017-12-30 DIAGNOSIS — F17210 Nicotine dependence, cigarettes, uncomplicated: Secondary | ICD-10-CM

## 2017-12-30 DIAGNOSIS — Z79899 Other long term (current) drug therapy: Secondary | ICD-10-CM | POA: Insufficient documentation

## 2017-12-30 DIAGNOSIS — I10 Essential (primary) hypertension: Secondary | ICD-10-CM | POA: Insufficient documentation

## 2017-12-30 DIAGNOSIS — D72821 Monocytosis (symptomatic): Secondary | ICD-10-CM | POA: Diagnosis not present

## 2017-12-30 DIAGNOSIS — D72820 Lymphocytosis (symptomatic): Secondary | ICD-10-CM | POA: Insufficient documentation

## 2017-12-30 LAB — CBC WITH DIFFERENTIAL/PLATELET
Abs Immature Granulocytes: 0.08 10*3/uL — ABNORMAL HIGH (ref 0.00–0.07)
Basophils Absolute: 0 10*3/uL (ref 0.0–0.1)
Basophils Relative: 0 %
Eosinophils Absolute: 0.1 10*3/uL (ref 0.0–0.5)
Eosinophils Relative: 1 %
HCT: 44.3 % (ref 39.0–52.0)
Hemoglobin: 14.5 g/dL (ref 13.0–17.0)
Immature Granulocytes: 1 %
Lymphocytes Relative: 25 %
Lymphs Abs: 3.4 10*3/uL (ref 0.7–4.0)
MCH: 29.7 pg (ref 26.0–34.0)
MCHC: 32.7 g/dL (ref 30.0–36.0)
MCV: 90.6 fL (ref 80.0–100.0)
Monocytes Absolute: 1 10*3/uL (ref 0.1–1.0)
Monocytes Relative: 7 %
Neutro Abs: 9.2 10*3/uL — ABNORMAL HIGH (ref 1.7–7.7)
Neutrophils Relative %: 66 %
Platelets: 250 10*3/uL (ref 150–400)
RBC: 4.89 MIL/uL (ref 4.22–5.81)
RDW: 12.6 % (ref 11.5–15.5)
WBC: 13.9 10*3/uL — ABNORMAL HIGH (ref 4.0–10.5)
nRBC: 0 % (ref 0.0–0.2)

## 2017-12-30 LAB — TECHNOLOGIST SMEAR REVIEW: Plt Morphology: ADEQUATE
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
## 2018-01-02 LAB — COMP PANEL: LEUKEMIA/LYMPHOMA

## 2018-01-02 NOTE — Telephone Encounter (Signed)
Referral placed.

## 2018-01-06 LAB — BCR-ABL1 FISH
Cells Analyzed: 200
Cells Counted: 200

## 2018-01-12 ENCOUNTER — Encounter: Payer: Self-pay | Admitting: Nurse Practitioner

## 2018-01-12 ENCOUNTER — Ambulatory Visit: Payer: Medicaid Other | Attending: Nurse Practitioner | Admitting: Nurse Practitioner

## 2018-01-12 ENCOUNTER — Other Ambulatory Visit: Payer: Self-pay

## 2018-01-12 VITALS — BP 130/85 | HR 66 | Temp 97.8°F | Ht 68.0 in | Wt 198.0 lb

## 2018-01-12 DIAGNOSIS — M25571 Pain in right ankle and joints of right foot: Secondary | ICD-10-CM | POA: Diagnosis not present

## 2018-01-12 DIAGNOSIS — G8929 Other chronic pain: Secondary | ICD-10-CM | POA: Insufficient documentation

## 2018-01-12 DIAGNOSIS — M546 Pain in thoracic spine: Secondary | ICD-10-CM | POA: Diagnosis not present

## 2018-01-12 DIAGNOSIS — M79671 Pain in right foot: Secondary | ICD-10-CM | POA: Insufficient documentation

## 2018-01-12 DIAGNOSIS — G8918 Other acute postprocedural pain: Secondary | ICD-10-CM | POA: Diagnosis not present

## 2018-01-12 MED ORDER — HYDROCODONE-ACETAMINOPHEN 5-325 MG PO TABS: 1.0000 | ORAL_TABLET | Freq: Four times a day (QID) | ORAL | 0 refills | Status: DC | PRN

## 2018-01-12 MED ORDER — PREGABALIN 25 MG PO CAPS: 25.0000 mg | ORAL_CAPSULE | Freq: Three times a day (TID) | ORAL | 2 refills | Status: DC

## 2018-01-12 NOTE — Progress Notes (Signed)
Nursing Pain Medication Assessment:  Safety precautions to be maintained throughout the outpatient stay will include: orient to surroundings, keep bed in low position, maintain call bell within reach at all times, provide assistance with transfer out of bed and ambulation.  Medication Inspection Compliance: Pill count conducted under aseptic conditions, in front of the patient. Neither the pills nor the bottle was removed from the patient's sight at any time. Once count was completed pills were immediately returned to the patient in their original bottle.  Medication: Hydrocodone/APAP Pill/Patch Count: 32 of 120 pills remain Pill/Patch Appearance: Markings consistent with prescribed medication Bottle Appearance: Standard pharmacy container. Clearly labeled. Filled Date: 20 / 18 / 2019 Last Medication intake:  Today

## 2018-01-12 NOTE — Patient Instructions (Addendum)
____________________________________________________________________________________________  Medication Rules  Purpose: To inform patients, and their family members, of our rules and regulations.  Applies to: All patients receiving prescriptions (written or electronic).  Pharmacy of record: Pharmacy where electronic prescriptions will be sent. If written prescriptions are taken to a different pharmacy, please inform the nursing staff. The pharmacy listed in the electronic medical record should be the one where you would like electronic prescriptions to be sent.  Electronic prescriptions: In compliance with the  Strengthen Opioid Misuse Prevention (STOP) Act of 2017 (Session Law 2017-74/H243), effective January 04, 2018, all controlled substances must be electronically prescribed. Calling prescriptions to the pharmacy will cease to exist.  Prescription refills: Only during scheduled appointments. Applies to all prescriptions.  NOTE: The following applies primarily to controlled substances (Opioid* Pain Medications).   Patient's responsibilities: 1. Pain Pills: Bring all pain pills to every appointment (except for procedure appointments). 2. Pill Bottles: Bring pills in original pharmacy bottle. Always bring the newest bottle. Bring bottle, even if empty. 3. Medication refills: You are responsible for knowing and keeping track of what medications you take and those you need refilled. The day before your appointment: write a list of all prescriptions that need to be refilled. The day of the appointment: give the list to the admitting nurse. Prescriptions will be written only during appointments. If you forget a medication: it will not be "Called in", "Faxed", or "electronically sent". You will need to get another appointment to get these prescribed. No early refills. Do not call asking to have your prescription filled early. 4. Prescription Accuracy: You are responsible for  carefully inspecting your prescriptions before leaving our office. Have the discharge nurse carefully go over each prescription with you, before taking them home. Make sure that your name is accurately spelled, that your address is correct. Check the name and dose of your medication to make sure it is accurate. Check the number of pills, and the written instructions to make sure they are clear and accurate. Make sure that you are given enough medication to last until your next medication refill appointment. 5. Taking Medication: Take medication as prescribed. When it comes to controlled substances, taking less pills or less frequently than prescribed is permitted and encouraged. Never take more pills than instructed. Never take medication more frequently than prescribed.  6. Inform other Doctors: Always inform, all of your healthcare providers, of all the medications you take. 7. Pain Medication from other Providers: You are not allowed to accept any additional pain medication from any other Doctor or Healthcare provider. There are two exceptions to this rule. (see below) In the event that you require additional pain medication, you are responsible for notifying us, as stated below. 8. Medication Agreement: You are responsible for carefully reading and following our Medication Agreement. This must be signed before receiving any prescriptions from our practice. Safely store a copy of your signed Agreement. Violations to the Agreement will result in no further prescriptions. (Additional copies of our Medication Agreement are available upon request.) 9. Laws, Rules, & Regulations: All patients are expected to follow all Federal and State Laws, Statutes, Rules, & Regulations. Ignorance of the Laws does not constitute a valid excuse. The use of any illegal substances is prohibited. 10. Adopted CDC guidelines & recommendations: Target dosing levels will be at or below 60 MME/day. Use of benzodiazepines** is not  recommended.  Exceptions: There are only two exceptions to the rule of not receiving pain medications from other Healthcare Providers. 1.   Exception #1 (Emergencies): In the event of an emergency (i.e.: accident requiring emergency care), you are allowed to receive additional pain medication. However, you are responsible for: As soon as you are able, call our office [phone], at any time of the day or night, and leave a message stating your name, the date and nature of the emergency, and the name and dose of the medication prescribed. In the event that your call is answered by a member of our staff, make sure to document and save the date, time, and the name of the person that took your information.  2. Exception #2 (Planned Surgery): In the event that you are scheduled by another doctor or dentist to have any type of surgery or procedure, you are allowed (for a period no longer than 30 days), to receive additional pain medication, for the acute post-op pain. However, in this case, you are responsible for picking up a copy of our "Post-op Pain Management for Surgeons" handout, and giving it to your surgeon or dentist. This document is available at our office, and does not require an appointment to obtain it. Simply go to our office during business hours (Monday-Thursday from 8:00 AM to 4:00 PM) (Friday 8:00 AM to 12:00 Noon) or if you have a scheduled appointment with [place], prior to your surgery, and ask for it by name. In addition, you will need to provide [place] with your name, name of your surgeon, type of surgery, and date of procedure or surgery.  *Opioid medications include: morphine, codeine, oxycodone, oxymorphone, hydrocodone, hydromorphone, meperidine, tramadol, tapentadol, buprenorphine, fentanyl, methadone. **Benzodiazepine medications include: diazepam (Valium), alprazolam (Xanax), clonazepam (Klonopine), lorazepam (Ativan), clorazepate (Tranxene), chlordiazepoxide (Librium), estazolam (Prosom),  oxazepam (Serax), temazepam (Restoril), triazolam (Halcion) (Last updated: 03/03/2017) ____________________________________________________________________________________________    BMI Assessment: Estimated body mass index is 30.11 kg/m as calculated from the following:   Height as of this encounter: 5\' 8"  (1.727 m).   Weight as of this encounter: 198 lb (89.8 kg).  BMI interpretation table: BMI level Category Range association with higher incidence of chronic pain  <18 kg/m2 Underweight   18.5-24.9 kg/m2 Ideal body weight   25-29.9 kg/m2 Overweight Increased incidence by 20%  30-34.9 kg/m2 Obese (Class I) Increased incidence by 68%  35-39.9 kg/m2 Severe obesity (Class II) Increased incidence by 136%  >40 kg/m2 Extreme obesity (Class III) Increased incidence by 254%   Patient's current BMI Ideal Body weight  Body mass index is 30.11 kg/m. Ideal body weight: 68.4 kg (150 lb 12.7 oz) Adjusted ideal body weight: 77 kg (169 lb 10.8 oz)   BMI Readings from Last 4 Encounters:  01/12/18 30.11 kg/m  12/30/17 30.18 kg/m  12/21/17 30.26 kg/m  12/14/17 30.26 kg/m   Wt Readings from Last 4 Encounters:  01/12/18 198 lb (89.8 kg)  12/30/17 198 lb 8 oz (90 kg)  12/21/17 199 lb (90.3 kg)  12/14/17 199 lb (90.3 kg)

## 2018-01-12 NOTE — Progress Notes (Signed)
Patient's Name: Caleb [name]  MRN: [id number]  Referring Provider: Virginia Crews, MD  DOB: Sep 24, 1955  PCP: Virginia Crews, MD  DOS: 01/12/2018  Note by: Vevelyn Francois NP  Service setting: Ambulatory outpatient  Specialty: Interventional Pain Management  Location: [facility] (AMB) Pain Management Facility    Patient type: Established    Primary Reason(s) for Visit: Encounter for prescription drug management. (Level of risk: moderate)  CC: Foot Pain  HPI  Caleb [name] is a 63 y.o. year old, male patient, who comes today for a medication management evaluation. He has OSA (obstructive sleep apnea); Myocardial infarction ([place]); Hypertension; Hyperlipidemia; COPD (chronic obstructive pulmonary disease) ([place]); Chronic foot pain (Primary Area of Pain) (Right); Tobacco abuse; Erectile dysfunction; CAD (coronary artery disease), native coronary artery; Chronic ankle pain (Secondary Area of Pain) (Right); Chronic thoracic back pain [facility] Area of Pain) (Midline); Chronic pain syndrome; Long term current use of opiate analgesic; Pharmacologic therapy; Disorder of skeletal system; Problems influencing health status; Elevated C-reactive protein (CRP); Elevated sed rate; 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); Spondylosis without myelopathy or radiculopathy, cervicothoracic region; Chronic musculoskeletal pain; Neurogenic foot pain (Right); DDD (degenerative disc disease), cervical; 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, 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); Cervicalgia (Bilateral) (L>R); DDD (degenerative disc disease), thoracic; Disorder of superficial peroneal nerve (Right); Chronic upper extremity pain (Left); Cervical spondylosis w/ radiculopathy; Cervical disc disorder with radiculopathy of cervical region; Chronic upper extremity weakness (Left); Abnormal nerve conduction studies (06/08/2017); 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 His primarily concern today is the Foot Pain  Pain Assessment: Location: Right Foot Radiating: Denies Onset: More than a month ago Duration: Chronic pain Quality: Burning, Numbness Severity: 4 /10 (subjective, self-reported pain score)  Note: Reported level is compatible with observation.                          Effect on ADL: limits my daily activities Timing: Constant Modifying factors: medications BP: 130/85  HR: 66  Caleb [name] was last scheduled for an appointment on 12/21/2017 for medication management. During today's appointment we reviewed Caleb [name] chronic pain status, as well as his outpatient medication regimen.  He admits that he is doing I whole lot better with his current regimen hydrocodone/acetaminophen 5/325 mg 4 times daily.Marland Kitchen  He admits that he wanted to increase the Lyrica slightly but has decided to hold off for now.  He is currently taking Lyrica 75 mg/day and though about an increase to Lyrica 123m/day. He continues to use the methocarbamol however he is cutting it in half since sometimes forths.  The patient  reports no history of drug use. His body mass index is 30.11 kg/m.  Further details on both, my assessment(s), as well as the proposed treatment plan, please see below.  Controlled Substance Pharmacotherapy Assessment REMS (Risk Evaluation and Mitigation Strategy)  Analgesic: Hydrocodone 5/325 mg 4 times daily MME/day: 20 mg/day.  Caleb Fischer RN  01/12/2018  8:12 AM  Sign when Signing Visit Nursing Pain Medication Assessment:  Safety precautions to be maintained throughout the outpatient stay will include: orient to surroundings, keep bed in low position, maintain call bell within reach at all times, provide assistance with transfer out of bed and ambulation.  Medication Inspection Compliance:  Pill count conducted under aseptic conditions, in front of  the patient. Neither the pills nor the bottle was removed from the patient's sight at any time. Once count was completed pills were immediately returned to the patient in their original bottle.  Medication: Hydrocodone/APAP Pill/Patch Count: 32 of 120 pills remain Pill/Patch Appearance: Markings consistent with prescribed medication Bottle Appearance: Standard pharmacy container. Clearly labeled. Filled Date: 28 / 18 / 2019 Last Medication intake:  Today   Pharmacokinetics: Liberation and absorption (onset of action): WNL Distribution (time to peak effect): WNL Metabolism and excretion (duration of action): WNL         Pharmacodynamics: Desired effects: Analgesia: [name] reports >50% benefit. Functional ability: Patient reports that medication allows him to accomplish basic ADLs Clinically meaningful improvement in function (CMIF): Sustained CMIF goals met Perceived effectiveness: Described as relatively effective, allowing for increase in activities of daily living (ADL) Undesirable effects: Side-effects or Adverse reactions: None reported Monitoring: [place] PMP: Online review of the past 40-monthperiod conducted. Compliant with practice rules and regulations Last UDS on record: Summary  Date Value Ref Range Status  12/21/2017 FINAL  Final    Comment:    ==================================================================== TOXASSURE SELECT 13 (MW) ==================================================================== Test                             Result       Flag       Units Drug Present and Declared for Prescription Verification   Oxycodone                      276          EXPECTED   ng/mg creat   Oxymorphone                    1086         EXPECTED   ng/mg creat   Noroxycodone                   1214         EXPECTED   ng/mg creat   Noroxymorphone                 397          EXPECTED   ng/mg creat    Sources of  oxycodone are scheduled prescription medications.    Oxymorphone, noroxycodone, and noroxymorphone are expected    metabolites of oxycodone. Oxymorphone is also available as a    scheduled prescription medication. Drug Absent but Declared for Prescription Verification   Hydrocodone                    Not Detected UNEXPECTED ng/mg creat ==================================================================== Test                      Result    Flag   Units      Ref Range   Creatinine              96               mg/dL      >=20 ==================================================================== Declared Medications:  The flagging and interpretation on this report are based on the  following declared medications.  Unexpected results may arise from  inaccuracies in the declared medications.  **Note: The testing scope of this panel includes these medications:  Hydrocodone (Norco)  Oxycodone  **Note: The testing scope of this panel does not include  following  reported medications:  Acetaminophen (Norco)  Albuterol  Aspirin (Aspirin 81)  Fluticasone (Flovent)  Hydrochlorothiazide (Hydrodiuril)  Isosorbide (Imdur)  Losartan (Cozaar)  Methocarbamol (Robaxin)  Metoprolol (Lopressor)  Nitroglycerin (Nitrostat)  Pregabalin (Lyrica)  Rosuvastatin (Crestor)  Secukinumab  Tiotropium (Spiriva)  Triamcinolone (Kenalog) ==================================================================== For clinical consultation, please call [phone]. ====================================================================    UDS interpretation: Compliant          Medication Assessment Form: Reviewed. Patient indicates being compliant with therapy Treatment compliance: Compliant Risk Assessment Profile: Aberrant behavior: See prior evaluations. None observed or detected today Comorbid factors increasing risk of overdose: See prior notes. No additional risks detected today Opioid risk tool (ORT) (Total  Score): 3 Personal History of Substance Abuse (SUD-Substance use disorder):  Alcohol: Negative  Illegal Drugs: Negative  Rx Drugs: Negative  ORT Risk Level calculation: Low Risk Risk of substance use disorder (SUD): Low Opioid Risk Tool - 01/12/18 0821      Family History of Substance Abuse   Alcohol  Positive Male    Illegal Drugs  Negative    Rx Drugs  Negative      Personal History of Substance Abuse   Alcohol  Negative    Illegal Drugs  Negative    Rx Drugs  Negative      Age   Age between 15-45 years   No      History of Preadolescent Sexual Abuse   History of Preadolescent Sexual Abuse  Negative or Male      Psychological Disease   Psychological Disease  Negative    Depression  Negative      Total Score   Opioid Risk Tool Scoring  3    Opioid Risk Interpretation  Low Risk      ORT Scoring interpretation table:  Score <3 = Low Risk for SUD  Score between 4-7 = Moderate Risk for SUD  Score >8 = High Risk for Opioid Abuse   Risk Mitigation Strategies:  Patient Counseling: Covered Patient-Prescriber Agreement (PPA): Present and active  Notification to other healthcare providers: Done  Pharmacologic Plan: No change in therapy, at this time.             Laboratory Chemistry  Inflammation Markers (CRP: Acute Phase) (ESR: Chronic Phase) Lab Results  Component Value Date   CRP 8 11/07/2017   ESRSEDRATE 26 11/07/2017                         Rheumatology Markers Lab Results  Component Value Date   RF <10.0 02/28/2017   ANA Negative 02/28/2017                        Renal Function Markers Lab Results  Component Value Date   BUN 17 12/14/2017   CREATININE 0.78 12/14/2017   BCR 22 12/14/2017   GFRAA 113 12/14/2017   GFRNONAA 98 12/14/2017                             Hepatic Function Markers Lab Results  Component Value Date   AST 22 12/14/2017   ALT 18 12/14/2017   ALBUMIN 4.4 12/14/2017   ALKPHOS 82 12/14/2017                         Electrolytes Lab Results  Component Value Date   NA 139 12/14/2017   K 4.0 12/14/2017  CL 97 12/14/2017   CALCIUM 9.7 12/14/2017   MG 2.0 02/17/2017                        Neuropathy Markers Lab Results  Component Value Date   VITAMINB12 403 02/17/2017   HIV Non Reactive 02/04/2017                        CNS Tests No results found for: COLORCSF, APPEARCSF, RBCCOUNTCSF, WBCCSF, POLYSCSF, LYMPHSCSF, EOSCSF, PROTEINCSF, GLUCCSF, JCVIRUS, CSFOLI, IGGCSF                      Bone Pathology Markers Lab Results  Component Value Date   25OHVITD1 32 02/17/2017   25OHVITD2 <1.0 02/17/2017   25OHVITD3 32 02/17/2017   TESTOFREE 5.2 (L) 02/28/2017   TESTOSTERONE 279 02/28/2017                         Coagulation Parameters Lab Results  Component Value Date   PLT 250 12/30/2017                        Cardiovascular Markers Lab Results  Component Value Date   TROPONINI <0.03 05/17/2017   HGB 14.5 12/30/2017   HCT 44.3 12/30/2017                         CA Markers No results found for: CEA, CA125, LABCA2                      Note: Lab results reviewed.  Recent Diagnostic Imaging Results  DG C-Arm 1-60 Min-No Report Fluoroscopy was utilized by the requesting physician.  No radiographic  interpretation.   Complexity Note: Imaging results reviewed. Results shared with Mr. Moltz, using Layman's terms.                         Meds   Current Outpatient Medications:  .  albuterol (PROVENTIL HFA;VENTOLIN HFA) 108 (90 Base) MCG/ACT inhaler, Inhale 1-2 puffs every 6 (six) hours as needed into the lungs for wheezing or shortness of breath., Disp: 1 Inhaler, Rfl: 5 .  aspirin EC 81 MG tablet, Take 81 mg by mouth daily., Disp: , Rfl:  .  fluticasone (FLOVENT HFA) 220 MCG/ACT inhaler, Inhale 1 puff 2 (two) times daily into the lungs., Disp: 1 Inhaler, Rfl: 5 .  hydrochlorothiazide (HYDRODIURIL) 25 MG tablet, TAKE 1 TABLET BY MOUTH EVERY DAY, Disp: 30 tablet, Rfl: 11 .  [START  ON 02/19/2018] HYDROcodone-acetaminophen (NORCO/VICODIN) 5-325 MG tablet, Take 1 tablet by mouth every 6 (six) hours as needed for severe pain., Disp: 120 tablet, Rfl: 0 .  isosorbide mononitrate (IMDUR) 30 MG 24 hr tablet, TAKE 0.5 TABLET(S) EVERY DAY BY ORAL ROUTE IN THE MORNING., Disp: 15 tablet, Rfl: 5 .  losartan (COZAAR) 100 MG tablet, Take 1 tablet (100 mg total) by mouth daily., Disp: 30 tablet, Rfl: 5 .  methocarbamol (ROBAXIN) 750 MG tablet, Take 1 tablet (750 mg total) by mouth every 8 (eight) hours as needed for muscle spasms., Disp: 90 tablet, Rfl: 5 .  metoprolol tartrate (LOPRESSOR) 25 MG tablet, TAKE 1 TABLET BY MOUTH TWICE A DAY, Disp: 60 tablet, Rfl: 2 .  nitroGLYCERIN (NITROSTAT) 0.4 MG SL tablet, Place 1 tablet (0.4 mg total) under the tongue every  5 (five) minutes as needed for chest pain., Disp: 30 tablet, Rfl: 3 .  pregabalin (LYRICA) 25 MG capsule, Take 1 capsule (25 mg total) by mouth 3 (three) times daily., Disp: 90 capsule, Rfl: 2 .  rosuvastatin (CRESTOR) 40 MG tablet, TAKE 1 TABLET BY MOUTH EVERY DAY, Disp: 90 tablet, Rfl: 3 .  Secukinumab (COSENTYX, 300 MG DOSE, [place]), Inject 300 mg into the skin every 30 (thirty) days. , Disp: , Rfl:  .  tiotropium (SPIRIVA) 18 MCG inhalation capsule, Place 1 capsule (18 mcg total) daily into inhaler and inhale., Disp: 30 capsule, Rfl: 5 .  triamcinolone ointment (KENALOG) 0.5 %, Apply 1 application 2 (two) times daily topically. On psoriasis lesions, Disp: 30 g, Rfl: 2 .  [START ON 01/20/2018] HYDROcodone-acetaminophen (NORCO/VICODIN) 5-325 MG tablet, Take 1 tablet by mouth every 6 (six) hours as needed for moderate pain., Disp: 120 tablet, Rfl: 0 .  [START ON 03/22/2018] HYDROcodone-acetaminophen (NORCO/VICODIN) 5-325 MG tablet, Take 1 tablet by mouth every 6 (six) hours as needed for moderate pain., Disp: 120 tablet, Rfl: 0  ROS  Constitutional: Denies any fever or chills Gastrointestinal: No reported hemesis, hematochezia, vomiting, or  acute GI distress Musculoskeletal: Denies any acute onset joint swelling, redness, loss of ROM, or weakness Neurological: No reported episodes of acute onset apraxia, aphasia, dysarthria, agnosia, amnesia, paralysis, loss of coordination, or loss of consciousness  Allergies  Mr. Grantz is allergic to lisinopril.  [place]  Drug: Mr. Fedora  reports no history of drug use. Alcohol:  reports current alcohol use of about 4.0 standard drinks of alcohol per week. Tobacco:  reports that he has been smoking cigarettes. He has a 24.50 pack-year smoking history. He has never used smokeless tobacco. Medical:  has a past medical history of CHF (congestive heart failure) ([place]), Chronic foot pain, right (2015), COPD (chronic obstructive pulmonary disease) ([place]), Coronary artery disease, Hyperlipidemia, Hypertension, Myocardial infarction ([place]) (2015), and OSA on CPAP. Surgical: Mr. Hutmacher  has a past surgical history that includes Cardiac catheterization (2015); Lumbar laminectomy (1989, 1999); Cervical fusion (1988, 1998); Liver surgery (2015); Partial colectomy (1990); Inguinal hernia repair (Bilateral, 1975); Lithotripsy; and Colonoscopy with propofol (N/A, 02/24/2017). Family: family history includes Alzheimer's disease in his paternal grandmother; Alzheimer's disease (age of onset: 75) in his father; Breast cancer in his maternal uncle; CAD in his mother; Diabetes in his maternal grandmother; Healthy in his sister; Heart attack in his maternal uncle; Heart failure in his maternal grandmother; Heart failure (age of onset: 61) in his mother; Non-Hodgkin's lymphoma in his sister.  Constitutional Exam  General appearance: Well nourished, well developed, and well hydrated. In no apparent acute distress Vitals:   01/12/18 0814  BP: 130/85  Pulse: 66  Temp: 97.8 F (36.6 C)  SpO2: 96%  Weight: 198 lb (89.8 kg)  Height: _0  (1.727 m)  Psych/Mental status: Alert, oriented x 3 (person, place, & time)        Eyes: PERLA Respiratory: No evidence of acute respiratory distress  Cervical Spine Area Exam  Skin & Axial Inspection: No masses, redness, edema, swelling, or associated skin lesions Alignment: Symmetrical Functional ROM: Unrestricted ROM      Stability: No instability detected Muscle Tone/Strength: Functionally intact. No obvious neuro-muscular anomalies detected. Sensory (Neurological): Unimpaired Palpation: No palpable anomalies              Upper Extremity (UE) Exam    Side: Right upper extremity  Side: Left upper extremity  Skin & Extremity Inspection: Skin color,  temperature, and hair growth are WNL. No peripheral edema or cyanosis. No masses, redness, swelling, asymmetry, or associated skin lesions. No contractures.  Skin & Extremity Inspection: Skin color, temperature, and hair growth are WNL. No peripheral edema or cyanosis. No masses, redness, swelling, asymmetry, or associated skin lesions. No contractures.  Functional ROM: Unrestricted ROM          Functional ROM: Unrestricted ROM          Muscle Tone/Strength: Functionally intact. No obvious neuro-muscular anomalies detected.  Muscle Tone/Strength: Functionally intact. No obvious neuro-muscular anomalies detected.  Sensory (Neurological): Unimpaired          Sensory (Neurological): Unimpaired          Palpation: No palpable anomalies              Palpation: No palpable anomalies                   Thoracic Spine Area Exam  Skin & Axial Inspection: No masses, redness, or swelling Alignment: Symmetrical Functional ROM: Unrestricted ROM Stability: No instability detected Muscle Tone/Strength: Functionally intact. No obvious neuro-muscular anomalies detected. Sensory (Neurological): Unimpaired Muscle strength & Tone: No palpable anomalies  Lumbar Spine Area Exam  Skin & Axial Inspection: No masses, redness, or swelling Alignment: Symmetrical Functional ROM: Unrestricted ROM       Stability: No instability detected Muscle  Tone/Strength: Functionally intact. No obvious neuro-muscular anomalies detected. Sensory (Neurological): Unimpaired Palpation: No palpable anomalies       Provocative Tests: Hyperextension/rotation test: deferred today       Lumbar quadrant test (Kemp's test): deferred today       Lateral bending test: deferred today       Patrick's Maneuver: deferred today                    Gait & Posture Assessment  Ambulation: Unassisted Gait: Relatively normal for age and body habitus Posture: WNL   Lower Extremity Exam    Side: Right lower extremity  Side: Left lower extremity  Stability: No instability observed          Stability: No instability observed          Skin & Extremity Inspection: Skin color, temperature, and hair growth are WNL. No peripheral edema or cyanosis. No masses, redness, swelling, asymmetry, or associated skin lesions. No contractures.  Skin & Extremity Inspection: Skin color, temperature, and hair growth are WNL. No peripheral edema or cyanosis. No masses, redness, swelling, asymmetry, or associated skin lesions. No contractures.  Functional ROM: Unrestricted ROM                  Functional ROM: Unrestricted ROM                  Muscle Tone/Strength: Functionally intact. No obvious neuro-muscular anomalies detected.  Muscle Tone/Strength: Functionally intact. No obvious neuro-muscular anomalies detected.  Sensory (Neurological): Unimpaired        Sensory (Neurological): Unimpaired         Assessment  Primary Diagnosis & Pertinent Problem List: The primary encounter diagnosis was Chronic foot pain (Primary Area of Pain) (Right). Diagnoses of Chronic ankle pain (Secondary Area of Pain) (Right), Chronic thoracic back pain [facility] Area of Pain) (Midline), and Acute postoperative pain were also pertinent to this visit.  Status Diagnosis  Controlled Controlled Controlled 1. Chronic foot pain (Primary Area of Pain) (Right)   2. Chronic ankle pain (Secondary Area of Pain)  (Right)   3.  Chronic thoracic back pain [facility] Area of Pain) (Midline)   4. Acute postoperative pain     Problems updated and reviewed during this visit: No problems updated. Plan of Care  Pharmacotherapy (Medications Ordered): Meds ordered this encounter  Medications  . HYDROcodone-acetaminophen (NORCO/VICODIN) 5-325 MG tablet    Sig: Take 1 tablet by mouth every 6 (six) hours as needed for severe pain.    Dispense:  120 tablet    Refill:  0    Do not place this medication, or any other prescription from our practice, on "Automatic Refill". Patient may have prescription filled one day early if pharmacy is closed on scheduled refill date.    Order Specific Question:   Supervising Provider    Answer:   Milinda Pointer [phone]  . pregabalin (LYRICA) 25 MG capsule    Sig: Take 1 capsule (25 mg total) by mouth 3 (three) times daily.    Dispense:  90 capsule    Refill:  2    Do not place this medication, or any other prescription from our practice, on "Automatic Refill". Patient may have prescription filled one day early if pharmacy is closed on scheduled refill date.    Order Specific Question:   Supervising Provider    Answer:   Milinda Pointer [phone]  . HYDROcodone-acetaminophen (NORCO/VICODIN) 5-325 MG tablet    Sig: Take 1 tablet by mouth every 6 (six) hours as needed for moderate pain.    Dispense:  120 tablet    Refill:  0    Do not place this medication, or any other prescription from our practice, on "Automatic Refill". Patient may have prescription filled one day early if pharmacy is closed on scheduled refill date.    Order Specific Question:   Supervising Provider    Answer:   Milinda Pointer [phone]  . HYDROcodone-acetaminophen (NORCO/VICODIN) 5-325 MG tablet    Sig: Take 1 tablet by mouth every 6 (six) hours as needed for moderate pain.    Dispense:  120 tablet    Refill:  0    Do not place this medication, or any other prescription from our practice, on  "Automatic Refill". Patient may have prescription filled one day early if pharmacy is closed on scheduled refill date.    Order Specific Question:   Supervising Provider    Answer:   Milinda Pointer [phone]   New Prescriptions   HYDROCODONE-ACETAMINOPHEN (NORCO/VICODIN) 5-325 MG TABLET    Take 1 tablet by mouth every 6 (six) hours as needed for moderate pain.   HYDROCODONE-ACETAMINOPHEN (NORCO/VICODIN) 5-325 MG TABLET    Take 1 tablet by mouth every 6 (six) hours as needed for moderate pain.   Medications administered today: Jalil L. Godlewski had no medications administered during this visit. Lab-work, procedure(s), and/or referral(s): No orders of the defined types were placed in this encounter.  Imaging and/or referral(s): None  Interventional management options: Planned, scheduled, and/or pending: [id number] bilateral cervical facet RFAunder fluoroscopic guidance and IV sedation, starting with the left side.   Considering: Palliative/diagnostic right-sidedCommon Peroneal nerve block#3 Palliative right-sided common peroneal nerve RFA #2(last done on 06/21/2017) Diagnostic right ankle block Diagnosticright lumbar sympathetic block Diagnostic midlinethoracic epidural steroid injection Diagnostic bilateral thoracic facet block Possible bilateral thoracic facet RFA Diagnostic cervical epidural steroid injection Diagnostic bilateral cervical facet block#3 Possible bilateral cervical facet RFA   Palliative PRN treatment(s): Palliative left sided cervical epidural steroid injection #4 Palliative right-sided common peroneal nerve blocks Palliative right-sided common peroneal nerve RFA #2(last done on 06/21/2017)  Provider-requested follow-up: Return in about 3 months (around 04/13/2018) for MedMgmt.  Future Appointments  Date Time Provider [place]  01/19/2018  2:30 PM Sindy Guadeloupe, MD [facility] None  04/06/2018  8:15 AM Vevelyn Francois, NP [facility] None  06/15/2018  9:40 AM Bacigalupo, Dionne Bucy, MD [facility] None   Primary Care Physician: Virginia Crews, MD Location: [facility] Note by: Vevelyn Francois NP Date: 01/12/2018; Time: 1:37 PM  Pain Score Disclaimer: We use the NRS-11 scale. This is a self-reported, subjective measurement of pain severity with only modest accuracy. It is used primarily to identify changes within a particular patient. It must be understood that outpatient pain scales are significantly less accurate that those used for research, where they can be applied under ideal controlled circumstances with minimal exposure to variables. In reality, the score is likely to be a combination of pain intensity and pain affect, where pain affect describes the degree of emotional arousal or changes in action readiness caused by the sensory experience of pain. Factors such as social and work situation, setting, emotional state, anxiety levels, expectation, and prior pain experience may influence pain perception and show large inter-individual differences that may also be affected by time variables.  Patient instructions provided during this appointment: Patient Instructions   ____________________________________________________________________________________________  Medication Rules  Purpose: To inform patients, and their family members, of our rules and regulations.  Applies to: All patients receiving prescriptions (written or electronic).  Pharmacy of record: Pharmacy where electronic prescriptions will be sent. If written prescriptions are taken to a different pharmacy, please inform the nursing staff. The pharmacy listed in the electronic medical record should be the one where you would like electronic prescriptions to be sent.  Electronic prescriptions: In compliance with the [place] (STOP) Act of 2017 (Session Lanny Cramp  [phone]), effective January 04, 2018, all controlled substances must be electronically prescribed. Calling prescriptions to the pharmacy will cease to exist.  Prescription refills: Only during scheduled appointments. Applies to all prescriptions.  NOTE: The following applies primarily to controlled substances (Opioid* Pain Medications).   Patient's responsibilities: 1. Pain Pills: Bring all pain pills to every appointment (except for procedure appointments). 2. Pill Bottles: Bring pills in original pharmacy bottle. Always bring the newest bottle. Bring bottle, even if empty. 3. Medication refills: You are responsible for knowing and keeping track of what medications you take and those you need refilled. The day before your appointment: write a list of all prescriptions that need to be refilled. The day of the appointment: give the list to the admitting nurse. Prescriptions will be written only during appointments. If you forget a medication: it will not be "Called in", "Faxed", or "electronically sent". You will need to get another appointment to get these prescribed. No early refills. Do not call asking to have your prescription filled early. 4. Prescription Accuracy: You are responsible for carefully inspecting your prescriptions before leaving our office. Have the discharge nurse carefully go over each prescription with you, before taking them home. Make sure that your name is accurately spelled, that your address is correct. Check the name and dose of your medication to make sure it is accurate. Check the number of pills, and the written instructions to make sure they are clear and accurate. Make sure that you are given enough medication to last until your next medication refill appointment. 5. Taking Medication: Take medication as prescribed. When it comes to controlled substances, taking  less pills or less frequently than prescribed is permitted and encouraged. Never take more pills than  instructed. Never take medication more frequently than prescribed.  6. Inform other Doctors: Always inform, all of your healthcare providers, of all the medications you take. 7. Pain Medication from other Providers: You are not allowed to accept any additional pain medication from any other Doctor or Healthcare provider. There are two exceptions to this rule. (see below) In the event that you require additional pain medication, you are responsible for notifying us, as stated below. 8. Medication Agreement: You are responsible for carefully reading and following our Medication Agreement. This must be signed before receiving any prescriptions from our practice. Safely store a copy of your signed Agreement. Violations to the Agreement will result in no further prescriptions. (Additional copies of our Medication Agreement are available upon request.) 9. Laws, Rules, & Regulations: All patients are expected to follow all Federal and [employer], [employer], Rules, [employer] [facility]. Ignorance of the Laws does not constitute a valid excuse. The use of any illegal substances is prohibited. 10. Adopted CDC guidelines & recommendations: Target dosing levels will be at or below 60 MME/day. Use of benzodiazepines** is not recommended.  Exceptions: There are only two exceptions to the rule of not receiving pain medications from other Healthcare Providers. 1. Exception #1 (Emergencies): In the event of an emergency (i.e.: accident requiring emergency care), you are allowed to receive additional pain medication. However, you are responsible for: As soon as you are able, call our office [phone], at any time of the day or night, and leave a message stating your name, the date and nature of the emergency, and the name and dose of the medication prescribed. In the event that your call is answered by a member of our staff, make sure to document and save the date, time, and the name of the person that took your information.   2. Exception #2 (Planned Surgery): In the event that you are scheduled by another doctor or dentist to have any type of surgery or procedure, you are allowed (for a period no longer than 30 days), to receive additional pain medication, for the acute post-op pain. However, in this case, you are responsible for picking up a copy of our "Post-op Pain Management for Surgeons" handout, and giving it to your surgeon or dentist. This document is available at our office, and does not require an appointment to obtain it. Simply go to our office during business hours (Monday-Thursday from 8:00 AM to 4:00 PM) (Friday 8:00 AM to 12:00 Noon) or if you have a scheduled appointment with [place], prior to your surgery, and ask for it by name. In addition, you will need to provide [place] with your name, name of your surgeon, type of surgery, and date of procedure or surgery.  *Opioid medications include: morphine, codeine, oxycodone, oxymorphone, hydrocodone, hydromorphone, meperidine, tramadol, tapentadol, buprenorphine, fentanyl, methadone. **Benzodiazepine medications include: diazepam (Valium), alprazolam (Xanax), clonazepam (Klonopine), lorazepam (Ativan), clorazepate (Tranxene), chlordiazepoxide (Librium), estazolam (Prosom), oxazepam (Serax), temazepam (Restoril), triazolam (Halcion) (Last updated: 03/03/2017) ____________________________________________________________________________________________    BMI Assessment: Estimated body mass index is 30.11 kg/m as calculated from the following:   Height as of this encounter: _0  (1.727 m).   Weight as of this encounter: 198 lb (89.8 kg).  BMI interpretation table: BMI level Category Range association with higher incidence of chronic pain  <18 kg/m2 Underweight   18.5-24.9 kg/m2 Ideal body weight   25-29.9 kg/m2 Overweight Increased incidence by  20%  30-34.9 kg/m2 Obese (Class I) Increased incidence by 68%  35-39.9 kg/m2 Severe obesity (Class II) Increased  incidence by 136%  >40 kg/m2 Extreme obesity (Class III) Increased incidence by 254%   Patient's current BMI Ideal Body weight  Body mass index is 30.11 kg/m. Ideal body weight: 68.4 kg (150 lb 12.7 oz) Adjusted ideal body weight: 77 kg (169 lb 10.8 oz)   BMI Readings from Last 4 Encounters:  01/12/18 30.11 kg/m  12/30/17 30.18 kg/m  12/21/17 30.26 kg/m  12/14/17 30.26 kg/m   Wt Readings from Last 4 Encounters:  01/12/18 198 lb (89.8 kg)  12/30/17 198 lb 8 oz (90 kg)  12/21/17 199 lb (90.3 kg)  12/14/17 199 lb (90.3 kg)

## 2018-01-16 ENCOUNTER — Encounter: Payer: Medicaid Other | Admitting: Nurse Practitioner

## 2018-01-17 ENCOUNTER — Encounter: Payer: Medicaid Other | Admitting: Nurse Practitioner

## 2018-01-19 ENCOUNTER — Encounter: Payer: Self-pay | Admitting: Oncology

## 2018-01-19 ENCOUNTER — Telehealth: Payer: Self-pay | Admitting: *Deleted

## 2018-01-19 ENCOUNTER — Inpatient Hospital Stay (HOSPITAL_BASED_OUTPATIENT_CLINIC_OR_DEPARTMENT_OTHER): Payer: Medicaid Other | Admitting: Oncology

## 2018-01-19 VITALS — HR 72 | Temp 97.4°F | Resp 18 | Ht 68.0 in | Wt 198.0 lb

## 2018-01-19 DIAGNOSIS — D72829 Elevated white blood cell count, unspecified: Secondary | ICD-10-CM

## 2018-01-19 NOTE — Telephone Encounter (Signed)
I called patient at his house today because he was in the room ready to be seen by Dr. Judithann Graves and apparently the patient had walked out.  I asked the patient come why he had walked out.  He said he just got tired of waiting because he had a 30-minute wait out in the lobby to get checked in and then it was 10 minutes later when Dr. Janese Banks walked in.  I asked him if he would like to be checked in the future with an appointment and labs because his white count will need to be monitored.  Patient states that he wants to speak with his primary care doctor to see if the CBC could be monitored in his office.  If not he will call me back and he did have my direct number of [phone].  I told patient that I will look forward to having his phone call to let me know how his plan of care would like to go in the future at our office

## 2018-01-19 NOTE — Progress Notes (Signed)
Here to get results from work up tests

## 2018-01-21 ENCOUNTER — Other Ambulatory Visit: Payer: Self-pay | Admitting: Nurse Practitioner

## 2018-01-24 ENCOUNTER — Encounter: Payer: Self-pay | Admitting: Family Medicine

## 2018-01-24 ENCOUNTER — Ambulatory Visit: Payer: Medicaid Other | Admitting: Family Medicine

## 2018-01-24 VITALS — BP 130/82 | HR 67 | Temp 97.6°F | Resp 16 | Ht 68.0 in | Wt 199.0 lb

## 2018-01-24 DIAGNOSIS — F411 Generalized anxiety disorder: Secondary | ICD-10-CM

## 2018-01-24 DIAGNOSIS — R457 State of emotional shock and stress, unspecified: Secondary | ICD-10-CM | POA: Diagnosis not present

## 2018-01-24 DIAGNOSIS — F331 Major depressive disorder, recurrent, moderate: Secondary | ICD-10-CM

## 2018-01-24 MED ORDER — SERTRALINE HCL 100 MG PO TABS: 100.0000 mg | ORAL_TABLET | Freq: Every day | ORAL | 3 refills | Status: DC

## 2018-01-24 NOTE — Patient Instructions (Addendum)
Take Zoloft 50mg  daily (1/2 tab) for 1 week If tolerating well, increase to 100mg  (1 whole pill) daily   Dementia Caregiver Guide Dementia is a term used to describe a number of symptoms that affect memory and thinking. The most common symptoms include:  Memory loss.  Trouble with language and communication.  Trouble concentrating.  Poor judgment.  Problems with reasoning.  Child-like behavior and language.  Extreme anxiety.  Angry outbursts.  Wandering from home or public places. Dementia usually gets worse slowly over time. In the early stages, people with dementia can stay independent and safe with some help. In later stages, they need help with daily tasks such as dressing, grooming, and using the bathroom. How to help the person with dementia cope Dementia can be frightening and confusing. Here are some tips to help the person with dementia cope with changes caused by the disease. General tips  Keep the person on track with his or her routine.  Try to identify areas where the person may need help.  Be supportive, patient, calm, and encouraging.  Gently remind the person that adjusting to changes takes time.  Help with the tasks that the person has asked for help with.  Keep the person involved in daily tasks and decisions as much as possible.  Encourage conversation, but try not to get frustrated or harried if the person struggles to find words or does not seem to appreciate your help. Communication tips  When the person is talking or seems frustrated, make eye contact and hold the person's hand.  Ask specific questions that need yes or no answers.  Use simple words, short sentences, and a calm voice. Only give one direction at a time.  When offering choices, limit them to just 1 or 2.  Avoid correcting the person in a negative way.  If the person is struggling to find the right words, gently try to help him or her. How to recognize symptoms of  stress Symptoms of stress in caregivers include:  Feeling frustrated or angry with the person with dementia.  Denying that the person has dementia or that his or her symptoms will not improve.  Feeling hopeless and unappreciated.  Difficulty sleeping.  Difficulty concentrating.  Feeling anxious, irritable, or depressed.  Developing stress-related health problems.  Feeling like you have too little time for your own life. Follow these instructions at home:   Make sure that you and the person you are caring for: ? Get regular sleep. ? Exercise regularly. ? Eat regular, nutritious meals. ? Drink enough fluid to keep your urine clear or pale yellow. ? Take over-the-counter and prescription medicines only as told by your health care providers. ? Attend all scheduled health care appointments.  Join a support group with others who are caregivers.  Ask about respite care resources so that you can have a regular break from the stress of caregiving.  Look for signs of stress in yourself and in the person you are caring for. If you notice signs of stress, take steps to manage it.  Consider any safety risks and take steps to avoid them.  Organize medications in a pill box for each day of the week.  Create a plan to handle any legal or financial matters. Get legal or financial advice if needed.  Keep a calendar in a central location to remind the person of appointments or other activities. Tips for reducing the risk of injury  Keep floors clear of clutter. Remove rugs, magazine racks, and  floor lamps.  Keep hallways well lit, especially at night.  Put a handrail and nonslip mat in the bathtub or shower.  Put childproof locks on cabinets that contain dangerous items, such as medicines, alcohol, guns, toxic cleaning items, sharp tools or utensils, matches, and lighters.  Put the locks in places where the person cannot see or reach them easily. This will help ensure that the person  does not wander out of the house and get lost.  Be prepared for emergencies. Keep a list of emergency phone numbers and addresses in a convenient area.  Remove car keys and lock garage doors so that the person does not try to get in the car and drive.  Have the person wear a bracelet that tracks locations and identifies the person as having memory problems. This should be worn at all times for safety. Where to find support: Many individuals and organizations offer support. These include:  Support groups for people with dementia and for caregivers.  Counselors or therapists.  Home health care services.  Adult day care centers. Where to find more information Alzheimer's Association: [employer] Contact a health care provider if:  The person's health is rapidly getting worse.  You are no longer able to care for the person.  Caring for the person is affecting your physical and emotional health.  The person threatens himself or herself, you, or anyone else. Summary  Dementia is a term used to describe a number of symptoms that affect memory and thinking.  Dementia usually gets worse slowly over time.  Take steps to reduce the person's risk of injury, and to plan for future care.  Caregivers need support, relief from caregiving, and time for their own lives. This information is not intended to replace advice given to you by your health care provider. Make sure you discuss any questions you have with your health care provider. Document Released: 11/25/2015 Document Revised: 11/25/2015 Document Reviewed: 11/25/2015 Elsevier Interactive Patient Education  2019 [employer].
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
## 2018-01-25 DIAGNOSIS — F331 Major depressive disorder, recurrent, moderate: Secondary | ICD-10-CM | POA: Insufficient documentation

## 2018-01-25 DIAGNOSIS — F411 Generalized anxiety disorder: Secondary | ICD-10-CM | POA: Insufficient documentation

## 2018-01-25 DIAGNOSIS — R457 State of emotional shock and stress, unspecified: Secondary | ICD-10-CM | POA: Insufficient documentation

## 2018-01-25 NOTE — Assessment & Plan Note (Signed)
Ongoing stress related to caring for his elderly demented father We discussed therapy Treatment for GAD and MDD as below

## 2018-01-25 NOTE — Assessment & Plan Note (Signed)
Treatment with Zoloft as above for MDD Discussed importance of therapy

## 2018-01-25 NOTE — Assessment & Plan Note (Signed)
New problem, but has had episodes of depression previously Did not respond well to SNRI and experienced side effects, so will treat with SSRI instead Agrees to start Zoloft Start with 50mg  daily x2 wks and then increase to 100mg  daily Discussed that it can take 6-8 weeks to reach full efficacy Discussed possible side effects

## 2018-01-31 ENCOUNTER — Telehealth: Payer: Self-pay | Admitting: Student in an Organized Health Care Education/Training Program

## 2018-01-31 NOTE — Telephone Encounter (Signed)
I called patient and Pharmacy,  informed them that Lyrica was written on 01/12/2018 and was sent and received by the pharm. Pharm stated that it can be filled on 02/02/2018 after I informed them that quantity should be 90 tabs. they had filled for a quantity of 30 pills.  Instructed Pharm that should have been filled for 90 pills. Called patient back and informed him that medication can be fill on 02/02/2018 and he should receive the amount it was written for.

## 2018-01-31 NOTE — Telephone Encounter (Signed)
Patient lvmail Mon 01-30-18 at 3:51 stating he needs refill on Lyrica. Pharmacy told him he does not have refills

## 2018-02-02 ENCOUNTER — Other Ambulatory Visit: Payer: Self-pay | Admitting: Family Medicine

## 2018-02-02 NOTE — Progress Notes (Addendum)
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 anomalies  Palpation: No palpable anomalies   Assessment  Primary Diagnosis & Pertinent Problem List: The primary encounter diagnosis was Chronic neck pain with history of cervical spinal surgery. 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  Status Diagnosis  Worsening Worsening Worsening 1. Chronic neck pain with history of cervical spinal surgery   2. Chronic neck pain (Bilateral) (L>R)   3. Chronic upper extremity pain (Left)   4. Chronic upper  extremity weakness (Left)   5. 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 DDD (degenerative disc disease), cervical   7. Cervical spondylosis w/ radiculopathy   8. 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 Abnormal MRI, cervical spine (08/13/2017)   10. Abnormal nerve conduction studies (06/08/2017)     Problems updated and reviewed during this visit: Problem  Chronic neck pain (Bilateral) (L>R)  Chronic Neck Pain With History of Cervical Spinal Surgery   Remote history of two-level ACDF, C4-5 and C5-6   Caregiver Stress Syndrome  Moderate Episode of Recurrent Major Depressive Disorder (Hcc)  Gad (Generalized Anxiety Disorder)  Pain of Left Breast (Resolved)  Flank Pain (Resolved)   Plan of Care  Pharmacotherapy (Medications Ordered): No orders of the defined types were placed in this encounter.  Medications administered today: Ikeem L. Strubel had no medications administered during this visit.   Procedure Orders     Cervical Epidural Injection Lab Orders  No laboratory test(s) ordered today   Imaging Orders  No imaging studies ordered today    Referral Orders     Ambulatory referral to Neurosurgery Interventional management options: Planned, scheduled, and/or pending:   The patient was previously seen by Dr. Cari Caraway, but at the time he was not inclined to have a surgery.  However, after seeing the MRI and the nerve conduction test and having realized that everything that we have to offer would provide him with short-term benefit, he has decided to proceed with a decompressive laminectomy of the cervical spine.  I went over the notes by Dr. Cari Caraway, who suggested the possibility of a spinal cord stimulator trial.  However, when I personally evaluated the films, the patient has very tight spinal stenosis at the C3-4 and C5-6 levels, where I cannot see enough space available for the spinal cord stimulator to be placed.    Considering:   Palliative/diagnostic right-sidedCommon Peroneal nerve block#3 Palliative right-sided common peroneal nerve RFA #2(last done on 06/21/2017) Diagnostic right ankle block Diagnosticright lumbar sympathetic block Diagnostic midlinethoracic epidural steroid injection Diagnostic bilateral thoracic facet block Possible bilateral thoracic facet RFA Diagnostic cervical epidural steroid injection Diagnostic bilateral cervical facet block#3 Possible bilateral cervical facet RFA   Palliative PRN treatment(s):   Palliative left sided cervical epidural steroid injection #4 Palliative right-sided common peroneal nerve blocks Palliative right-sided common peroneal nerve RFA #2(last done on 06/21/2017)   Provider-requested follow-up: Return for Med-Mgmt, w/ Dionisio David, NP, & PRN Procedure: (ML) Palliative CESI.  Future Appointments  Date Time Provider [place]  02/07/2018 10:30 AM Billey Co, MD [facility] None  03/08/2018  8:00 AM Virginia Crews, MD [facility] None  04/06/2018  8:15 AM Vevelyn Francois, NP [facility] None  06/15/2018  9:40 AM Bacigalupo, Dionne Bucy, MD [facility] None   Primary Care Physician: Virginia Crews, MD Location: [facility] Note by: Gaspar Cola, MD Date: 02/06/2018; Time: 1:20 PM

## 2018-02-06 ENCOUNTER — Encounter: Payer: Self-pay | Admitting: Pain Medicine

## 2018-02-06 ENCOUNTER — Ambulatory Visit: Payer: Medicaid Other | Admitting: Urology

## 2018-02-06 ENCOUNTER — Encounter: Payer: Self-pay | Admitting: Urology

## 2018-02-06 ENCOUNTER — Other Ambulatory Visit: Payer: Self-pay

## 2018-02-06 ENCOUNTER — Ambulatory Visit: Payer: Medicaid Other | Attending: Pain Medicine | Admitting: Pain Medicine

## 2018-02-06 VITALS — BP 105/66 | HR 63 | Temp 97.5°F | Resp 18 | Ht 68.0 in | Wt 198.0 lb

## 2018-02-06 DIAGNOSIS — R937 Abnormal findings on diagnostic imaging of other parts of musculoskeletal system: Secondary | ICD-10-CM | POA: Diagnosis not present

## 2018-02-06 DIAGNOSIS — G8929 Other chronic pain: Secondary | ICD-10-CM | POA: Diagnosis not present

## 2018-02-06 DIAGNOSIS — M4722 Other spondylosis with radiculopathy, cervical region: Secondary | ICD-10-CM | POA: Diagnosis not present

## 2018-02-06 DIAGNOSIS — R29898 Other symptoms and signs involving the musculoskeletal system: Secondary | ICD-10-CM | POA: Diagnosis not present

## 2018-02-06 DIAGNOSIS — Z9889 Other specified postprocedural states: Secondary | ICD-10-CM | POA: Insufficient documentation

## 2018-02-06 DIAGNOSIS — M542 Cervicalgia: Secondary | ICD-10-CM | POA: Diagnosis not present

## 2018-02-06 DIAGNOSIS — R94130 Abnormal response to nerve stimulation, unspecified: Secondary | ICD-10-CM | POA: Insufficient documentation

## 2018-02-06 DIAGNOSIS — G8928 Other chronic postprocedural pain: Secondary | ICD-10-CM | POA: Insufficient documentation

## 2018-02-06 DIAGNOSIS — M79602 Pain in left arm: Secondary | ICD-10-CM | POA: Insufficient documentation

## 2018-02-06 DIAGNOSIS — M5030 Other cervical disc degeneration, unspecified cervical region: Secondary | ICD-10-CM | POA: Diagnosis not present

## 2018-02-06 DIAGNOSIS — M5412 Radiculopathy, cervical region: Secondary | ICD-10-CM | POA: Diagnosis not present

## 2018-02-06 DIAGNOSIS — M4802 Spinal stenosis, cervical region: Secondary | ICD-10-CM | POA: Insufficient documentation

## 2018-02-06 NOTE — Progress Notes (Signed)
Nursing Pain Medication Assessment:  Safety precautions to be maintained throughout the outpatient stay will include: orient to surroundings, keep bed in low position, maintain call bell within reach at all times, provide assistance with transfer out of bed and ambulation.  Medication Inspection Compliance: Pill count conducted under aseptic conditions, in front of the patient. Neither the pills nor the bottle was removed from the patient's sight at any time. Once count was completed pills were immediately returned to the patient in their original bottle.  Medication: Hydrocodone/APAP Pill/Patch Count: 58 of 120 pills remain Pill/Patch Appearance: Markings consistent with prescribed medication Bottle Appearance: Standard pharmacy container. Clearly labeled. Filled Date: 01 / 18 / 2020 Last Medication intake:  Today  At 11:30

## 2018-02-06 NOTE — Patient Instructions (Signed)
____________________________________________________________________________________________  Preparing for Procedure with Sedation  Instructions: . Oral Intake: Do not eat or drink anything for at least 8 hours prior to your procedure. . Transportation: Public transportation is not allowed. Bring an adult driver. The driver must be physically present in our waiting room before any procedure can be started. . Physical Assistance: Bring an adult physically capable of assisting you, in the event you need help. This adult should keep you company at home for at least 6 hours after the procedure. . Blood Pressure Medicine: Take your blood pressure medicine with a sip of water the morning of the procedure. . Blood thinners: Notify our staff if you are taking any blood thinners. Depending on which one you take, there will be specific instructions on how and when to stop it. . Diabetics on insulin: Notify the staff so that you can be scheduled 1st case in the morning. If your diabetes requires high dose insulin, take only  of your normal insulin dose the morning of the procedure and notify the staff that you have done so. . Preventing infections: Shower with an antibacterial soap the morning of your procedure. . Build-up your immune system: Take 1000 mg of Vitamin C with every meal (3 times a day) the day prior to your procedure. . Antibiotics: Inform the staff if you have a condition or reason that requires you to take antibiotics before dental procedures. . Pregnancy: If you are pregnant, call and cancel the procedure. . Sickness: If you have a cold, fever, or any active infections, call and cancel the procedure. . Arrival: You must be in the facility at least 30 minutes prior to your scheduled procedure. . Children: Do not bring children with you. . Dress appropriately: Bring dark clothing that you would not mind if they get stained. . Valuables: Do not bring any jewelry or valuables.  Procedure  appointments are reserved for interventional treatments only. . No Prescription Refills. . No medication changes will be discussed during procedure appointments. . No disability issues will be discussed.  Reasons to call and reschedule or cancel your procedure: (Following these recommendations will minimize the risk of a serious complication.) . Surgeries: Avoid having procedures within 2 weeks of any surgery. (Avoid for 2 weeks before or after any surgery). . Flu Shots: Avoid having procedures within 2 weeks of a flu shots or . (Avoid for 2 weeks before or after immunizations). . Barium: Avoid having a procedure within 7-10 days after having had a radiological study involving the use of radiological contrast. (Myelograms, Barium swallow or enema study). . Heart attacks: Avoid any elective procedures or surgeries for the initial 6 months after a "Myocardial Infarction" (Heart Attack). . Blood thinners: It is imperative that you stop these medications before procedures. Let us know if you if you take any blood thinner.  . Infection: Avoid procedures during or within two weeks of an infection (including chest colds or gastrointestinal problems). Symptoms associated with infections include: Localized redness, fever, chills, night sweats or profuse sweating, burning sensation when voiding, cough, congestion, stuffiness, runny nose, sore throat, diarrhea, nausea, vomiting, cold or Flu symptoms, recent or current infections. It is specially important if the infection is over the area that we intend to treat. . Heart and lung problems: Symptoms that may suggest an active cardiopulmonary problem include: cough, chest pain, breathing difficulties or shortness of breath, dizziness, ankle swelling, uncontrolled high or unusually low blood pressure, and/or palpitations. If you are experiencing any of these symptoms, cancel   your procedure and contact your primary care physician for an evaluation.  Remember:   Regular Business hours are:  Monday to Thursday 8:00 AM to 4:00 PM  Provider's Schedule: Tasmine Hipwell, MD:  Procedure days: Tuesday and Thursday 7:30 AM to 4:00 PM  Bilal Lateef, MD:  Procedure days: Monday and Wednesday 7:30 AM to 4:00 PM ____________________________________________________________________________________________    

## 2018-02-06 NOTE — Addendum Note (Signed)
Addended by: Milinda Pointer A on: 02/06/2018 01:20 PM   Modules accepted: Orders

## 2018-02-07 ENCOUNTER — Ambulatory Visit
Admission: RE | Admit: 2018-02-07 | Discharge: 2018-02-07 | Disposition: A | Discharge status: Home / Self Care | Payer: Medicaid Other | Source: Ambulatory Visit | Attending: Urology | Admitting: Urology

## 2018-02-07 ENCOUNTER — Ambulatory Visit (INDEPENDENT_AMBULATORY_CARE_PROVIDER_SITE_OTHER): Payer: Medicaid Other | Admitting: Urology

## 2018-02-07 ENCOUNTER — Encounter: Payer: Self-pay | Admitting: Urology

## 2018-02-07 VITALS — BP 112/70 | HR 65 | Ht 68.0 in | Wt 197.8 lb

## 2018-02-07 DIAGNOSIS — N200 Calculus of kidney: Secondary | ICD-10-CM | POA: Diagnosis not present

## 2018-02-07 DIAGNOSIS — R1032 Left lower quadrant pain: Secondary | ICD-10-CM | POA: Insufficient documentation

## 2018-02-07 LAB — URINALYSIS, COMPLETE
Bilirubin, UA: NEGATIVE
Glucose, UA: NEGATIVE
Ketones, UA: NEGATIVE
Leukocytes, UA: NEGATIVE
Nitrite, UA: NEGATIVE
Protein, UA: NEGATIVE
Specific Gravity, UA: 1.01 (ref 1.005–1.030)
Urobilinogen, Ur: 0.2 mg/dL (ref 0.2–1.0)
pH, UA: 7 (ref 5.0–7.5)

## 2018-02-07 NOTE — Progress Notes (Signed)
   02/07/2018 10:46 AM   [name] [birth year] [id number]  Reason for visit: Left groin and flank pain  HPI: I saw Mr. Fier for the first time in clinic today to discuss new onset of left lower quadrant groin and flank pain.  He is a 63 year old male with history of CAD with stents, not on any anticoagulation, chronic pain on narcotics, and depression.  He reports a 2-week history of sharp and radiating left flank and groin pain.  He denies any gross hematuria, fevers, or chills.  He does have a history of nephrolithiasis and has undergone 2 prior ureteroscopy's and and SWL.  There are no aggravating or alleviating factors.  Severity is moderate.   ROS: Please see flowsheet from today's date for complete review of systems.  Physical Exam: BP 112/70 (BP Location: Left Arm, Patient Position: Sitting, Cuff Size: Normal)   Pulse 65   Ht 5\' 8"  (1.727 m)   Wt 197 lb 12.8 oz (89.7 kg)   BMI 30.08 kg/m    Constitutional:  Alert and oriented, No acute distress. Respiratory: Normal respiratory effort, no increased work of breathing. GI: Abdomen is soft, nontender, nondistended, no abdominal masses GU: No CVA tenderness Skin: No rashes, bruises or suspicious lesions. Neurologic: Grossly intact, no focal deficits, moving all 4 extremities. Psychiatric: Normal mood and affect  Laboratory Data: Urinalysis today 0 WBCs, 0 RBCs, no bacteria, nitrite negative  Pertinent Imaging: I have personally reviewed the CT stone protocol.  No hydronephrosis or ureteral stones.  Left 7 mm lower pole stones x2  Assessment & Plan:   In summary, the patient is a 63 year old male with history of nephrolithiasis as well as chronic pain who presented with 2 weeks of left-sided flank pain today worrisome for possible urolithiasis.  He did have a CT today that showed no ureteral stones or hydronephrosis.  He has non-obstructing lower pole stones on the left side.  We discussed general stone prevention  strategies including adequate hydration with goal of producing 2.5 L of urine daily, increasing citric acid intake, increasing calcium intake during high oxalate meals, minimizing animal protein, and decreasing salt intake. Information about dietary recommendations given today.   RTC 1 year with [facility] for stone surveillance  Billey Co, MD  [facility] [address], [place], [place] [address] [phone]

## 2018-02-08 ENCOUNTER — Telehealth: Payer: Self-pay | Admitting: Family Medicine

## 2018-02-08 NOTE — Telephone Encounter (Signed)
-----   Message from Billey Co, MD sent at 02/07/2018  5:03 PM EST ----- No obstructing ureteral stones found on CT today that would explain his left-sided pain.  There is a small stone in the kidney on the left side that is not causing any blockage and would not be responsible for his symptoms.  His kidney is not a source for his left-sided pain.  Follow-up in 1 year with [facility]  Nickolas Madrid, MD 02/07/2018

## 2018-02-08 NOTE — Telephone Encounter (Signed)
Patient notified and voiced understanding.

## 2018-02-09 ENCOUNTER — Encounter (HOSPITAL_COMMUNITY): Payer: Self-pay

## 2018-02-09 ENCOUNTER — Emergency Department (HOSPITAL_COMMUNITY): Payer: Medicaid Other

## 2018-02-09 ENCOUNTER — Emergency Department (HOSPITAL_COMMUNITY)
Admission: EM | Admit: 2018-02-09 | Discharge: 2018-02-09 | Disposition: A | Discharge status: Home / Self Care | Payer: Medicaid Other | Attending: Emergency Medicine | Admitting: Emergency Medicine

## 2018-02-09 DIAGNOSIS — Y939 Activity, unspecified: Secondary | ICD-10-CM | POA: Insufficient documentation

## 2018-02-09 DIAGNOSIS — M79645 Pain in left finger(s): Secondary | ICD-10-CM | POA: Diagnosis not present

## 2018-02-09 DIAGNOSIS — R0602 Shortness of breath: Secondary | ICD-10-CM | POA: Diagnosis not present

## 2018-02-09 DIAGNOSIS — Z7982 Long term (current) use of aspirin: Secondary | ICD-10-CM | POA: Insufficient documentation

## 2018-02-09 DIAGNOSIS — M542 Cervicalgia: Secondary | ICD-10-CM | POA: Diagnosis not present

## 2018-02-09 DIAGNOSIS — R1032 Left lower quadrant pain: Secondary | ICD-10-CM | POA: Diagnosis not present

## 2018-02-09 DIAGNOSIS — Y929 Unspecified place or not applicable: Secondary | ICD-10-CM | POA: Insufficient documentation

## 2018-02-09 DIAGNOSIS — R55 Syncope and collapse: Secondary | ICD-10-CM | POA: Diagnosis not present

## 2018-02-09 DIAGNOSIS — S99921A Unspecified injury of right foot, initial encounter: Secondary | ICD-10-CM | POA: Diagnosis not present

## 2018-02-09 DIAGNOSIS — Z79899 Other long term (current) drug therapy: Secondary | ICD-10-CM | POA: Insufficient documentation

## 2018-02-09 DIAGNOSIS — S3991XA Unspecified injury of abdomen, initial encounter: Secondary | ICD-10-CM | POA: Diagnosis not present

## 2018-02-09 DIAGNOSIS — M5412 Radiculopathy, cervical region: Secondary | ICD-10-CM | POA: Diagnosis not present

## 2018-02-09 DIAGNOSIS — Y999 Unspecified external cause status: Secondary | ICD-10-CM | POA: Diagnosis not present

## 2018-02-09 DIAGNOSIS — J449 Chronic obstructive pulmonary disease, unspecified: Secondary | ICD-10-CM | POA: Insufficient documentation

## 2018-02-09 DIAGNOSIS — R079 Chest pain, unspecified: Secondary | ICD-10-CM | POA: Insufficient documentation

## 2018-02-09 DIAGNOSIS — F17210 Nicotine dependence, cigarettes, uncomplicated: Secondary | ICD-10-CM | POA: Diagnosis not present

## 2018-02-09 DIAGNOSIS — S99911A Unspecified injury of right ankle, initial encounter: Secondary | ICD-10-CM | POA: Diagnosis not present

## 2018-02-09 DIAGNOSIS — S199XXA Unspecified injury of neck, initial encounter: Secondary | ICD-10-CM | POA: Diagnosis not present

## 2018-02-09 DIAGNOSIS — S50312A Abrasion of left elbow, initial encounter: Secondary | ICD-10-CM | POA: Diagnosis not present

## 2018-02-09 DIAGNOSIS — S299XXA Unspecified injury of thorax, initial encounter: Secondary | ICD-10-CM | POA: Diagnosis not present

## 2018-02-09 DIAGNOSIS — S59902A Unspecified injury of left elbow, initial encounter: Secondary | ICD-10-CM | POA: Diagnosis not present

## 2018-02-09 DIAGNOSIS — I509 Heart failure, unspecified: Secondary | ICD-10-CM | POA: Insufficient documentation

## 2018-02-09 DIAGNOSIS — M79671 Pain in right foot: Secondary | ICD-10-CM | POA: Diagnosis not present

## 2018-02-09 DIAGNOSIS — S0990XA Unspecified injury of head, initial encounter: Secondary | ICD-10-CM | POA: Diagnosis not present

## 2018-02-09 DIAGNOSIS — S6992XA Unspecified injury of left wrist, hand and finger(s), initial encounter: Secondary | ICD-10-CM | POA: Diagnosis not present

## 2018-02-09 DIAGNOSIS — I110 Hypertensive heart disease with heart failure: Secondary | ICD-10-CM | POA: Diagnosis not present

## 2018-02-09 DIAGNOSIS — I10 Essential (primary) hypertension: Secondary | ICD-10-CM | POA: Diagnosis not present

## 2018-02-09 DIAGNOSIS — R0789 Other chest pain: Secondary | ICD-10-CM | POA: Diagnosis not present

## 2018-02-09 DIAGNOSIS — M4802 Spinal stenosis, cervical region: Secondary | ICD-10-CM | POA: Diagnosis not present

## 2018-02-09 DIAGNOSIS — M25571 Pain in right ankle and joints of right foot: Secondary | ICD-10-CM | POA: Diagnosis not present

## 2018-02-09 LAB — COMPREHENSIVE METABOLIC PANEL
ALT: 20 U/L (ref 0–44)
AST: 21 U/L (ref 15–41)
Albumin: 3.6 g/dL (ref 3.5–5.0)
Alkaline Phosphatase: 58 U/L (ref 38–126)
Anion gap: 14 (ref 5–15)
BUN: 16 mg/dL (ref 6–20)
CO2: 27 mmol/L (ref 22–32)
Calcium: 9.5 mg/dL (ref 8.9–10.3)
Chloride: 98 mmol/L (ref 98–111)
Creatinine, Ser: 0.87 mg/dL (ref 0.61–1.24)
GFR calc Af Amer: >60 mL/min (ref >60)
GFR calc non Af Amer: >60 mL/min (ref >60)
Glucose, Bld: 156 mg/dL — ABNORMAL HIGH (ref 70–99)
Potassium: 3.2 mmol/L — ABNORMAL LOW (ref 3.5–5.1)
Sodium: 139 mmol/L (ref 135–145)
Total Bilirubin: 0.5 mg/dL (ref 0.3–1.2)
Total Protein: 7.1 g/dL (ref 6.5–8.1)

## 2018-02-09 LAB — CBC
HCT: 43.4 % (ref 39.0–52.0)
Hemoglobin: 14.3 g/dL (ref 13.0–17.0)
MCH: 29.9 pg (ref 26.0–34.0)
MCHC: 32.9 g/dL (ref 30.0–36.0)
MCV: 90.6 fL (ref 80.0–100.0)
Platelets: 265 10*3/uL (ref 150–400)
RBC: 4.79 MIL/uL (ref 4.22–5.81)
RDW: 12.5 % (ref 11.5–15.5)
WBC: 13.9 10*3/uL — ABNORMAL HIGH (ref 4.0–10.5)
nRBC: 0 % (ref 0.0–0.2)

## 2018-02-09 LAB — PROTIME-INR
INR: 1.05
Prothrombin Time: 13.6 seconds (ref 11.4–15.2)

## 2018-02-09 LAB — I-STAT TROPONIN, ED: Troponin i, poc: 0.01 ng/mL (ref 0.00–0.08)

## 2018-02-09 LAB — ETHANOL: Alcohol, Ethyl (B): <10 mg/dL (ref <10)

## 2018-02-09 LAB — CDS SEROLOGY

## 2018-02-09 MED ORDER — FENTANYL CITRATE (PF) 100 MCG/2ML IJ SOLN
100.0000 ug | Freq: Once | INTRAMUSCULAR | Status: AC
  Administered 2018-02-09: 100 ug via INTRAVENOUS
  Filled 2018-02-09: qty 2

## 2018-02-09 MED ORDER — BACITRACIN ZINC 500 UNIT/GM EX OINT: TOPICAL_OINTMENT | Freq: Once | CUTANEOUS | Status: DC

## 2018-02-09 MED ORDER — MORPHINE SULFATE (PF) 4 MG/ML IV SOLN
6.0000 mg | Freq: Once | INTRAVENOUS | Status: AC
  Administered 2018-02-09: 6 mg via INTRAVENOUS
  Filled 2018-02-09: qty 2

## 2018-02-09 MED ORDER — IOHEXOL 300 MG/ML  SOLN
100.0000 mL | Freq: Once | INTRAMUSCULAR | Status: AC | PRN
  Administered 2018-02-09: 100 mL via INTRAVENOUS
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
## 2018-02-09 NOTE — ED Notes (Signed)
Verbal permission obtained from pt so his sister, Mylo Red can get information.
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
## 2018-02-09 NOTE — ED Notes (Signed)
Ortho at bedside for thumb spica

## 2018-02-09 NOTE — ED Notes (Signed)
Pt provided urinal for urine sample Pt medicated for pain per MD order
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
## 2018-02-09 NOTE — Progress Notes (Signed)
Orthopedic Tech Progress Note Patient Details:  Caleb [name] 04-06-1955 [id number]  Ortho Devices Type of Ortho Device: Ace wrap, Post (short arm) splint Ortho Device/Splint Interventions: Application   Post Interventions Patient Tolerated: Well Instructions Provided: Care of device   [name] 02/09/2018, 5:00 PM
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
## 2018-02-14 ENCOUNTER — Ambulatory Visit: Payer: Medicaid Other | Admitting: Family Medicine

## 2018-02-14 ENCOUNTER — Encounter: Payer: Self-pay | Admitting: Family Medicine

## 2018-02-14 DIAGNOSIS — S62512D Displaced fracture of proximal phalanx of left thumb, subsequent encounter for fracture with routine healing: Secondary | ICD-10-CM

## 2018-02-14 DIAGNOSIS — R55 Syncope and collapse: Secondary | ICD-10-CM

## 2018-02-14 NOTE — Patient Instructions (Signed)
Syncope    Syncope refers to a condition in which a person temporarily loses consciousness. Syncope may also be called fainting or passing out. It is caused by a sudden decrease in blood flow to the brain. Even though most causes of syncope are not dangerous, syncope can be a sign of a serious medical problem. Your health care provider may do tests to find the reason why you are having syncope.  Signs that you may be about to faint include:   Feeling dizzy or light-headed.   Feeling nauseous.   Seeing all white or all black in your field of vision.   Having cold, clammy skin.  If you faint, get medical help right away. Call your local emergency services (911 in the [place] Do not drive yourself to the hospital.  Follow these instructions at home:  Pay attention to any changes in your symptoms. Take these actions to stay safe and to help relieve your symptoms:  Lifestyle   Do not drive, use machinery, or play sports until your health care provider says it is okay.   Do not drink alcohol.   Do not use any products that contain nicotine or tobacco, such as cigarettes and e-cigarettes. If you need help quitting, ask your health care provider.   Drink enough fluid to keep your urine pale yellow.  General instructions   Take over-the-counter and prescription medicines only as told by your health care provider.   If you are taking blood pressure or heart medicine, get up slowly and take several minutes to sit and then stand. This can reduce dizziness or light-headedness.   Have someone stay with you until you feel stable.   If you start to feel like you might faint, lie down right away and raise (elevate) your feet above the level of your heart. Breathe deeply and steadily. Wait until all the symptoms have passed.   Keep all follow-up visits as told by your health care provider. This is important.  Get help right away if you:   Have a severe headache.   Faint once or repeatedly.   Have pain in your chest,  abdomen, or back.   Have a very fast or irregular heartbeat (palpitations).   Have pain when you breathe.   Are bleeding from your mouth or rectum, or you have black or tarry stool.   Have a seizure.   Are confused.   Have trouble walking.   Have severe weakness.   Have vision problems.  These symptoms may represent a serious problem that is an emergency. Do not wait to see if your symptoms will go away. Get medical help right away. Call your local emergency services (911 in the [place] Do not drive yourself to the hospital.  Summary   Syncope refers to a condition in which a person temporarily loses consciousness. It is caused by a sudden decrease in blood flow to the brain.   Signs that you may be about to faint include dizziness, feeling light-headed, feeling nauseous, sudden vision changes, or cold, clammy skin.   Although most causes of syncope are not dangerous, syncope can be a sign of a serious medical problem. If you faint, get medical help right away.  This information is not intended to replace advice given to you by your health care provider. Make sure you discuss any questions you have with your health care provider.  Document Released: 12/21/2004 Document Revised: 11/29/2016 Document Reviewed: 11/29/2016  [employer]vier Interactive Patient Education  2019 [employer]

## 2018-02-16 ENCOUNTER — Ambulatory Visit: Payer: Medicaid Other | Admitting: Physician Assistant

## 2018-02-17 ENCOUNTER — Other Ambulatory Visit: Payer: Self-pay | Admitting: Family Medicine

## 2018-02-17 ENCOUNTER — Telehealth: Payer: Self-pay

## 2018-02-17 NOTE — Telephone Encounter (Signed)
Patient wanted to know if Dr. B would send in something for him to quit smoking. [facility] [address]. Thanks!

## 2018-02-20 MED ORDER — VARENICLINE TARTRATE 0.5 MG X 11 & 1 MG X 42 PO MISC: ORAL | 0 refills | Status: DC

## 2018-02-20 NOTE — Telephone Encounter (Signed)
Sent starter pack of Chantix.  Call before runs out and will send continuing packs.

## 2018-02-20 NOTE — Telephone Encounter (Signed)
Patient advised.

## 2018-02-21 ENCOUNTER — Other Ambulatory Visit: Payer: Self-pay

## 2018-02-21 ENCOUNTER — Encounter: Payer: Self-pay | Admitting: Pain Medicine

## 2018-02-21 ENCOUNTER — Ambulatory Visit
Admission: RE | Admit: 2018-02-21 | Discharge: 2018-02-21 | Disposition: A | Discharge status: Home / Self Care | Payer: No Typology Code available for payment source | Source: Ambulatory Visit | Attending: Pain Medicine | Admitting: Pain Medicine

## 2018-02-21 ENCOUNTER — Other Ambulatory Visit: Payer: Self-pay | Admitting: Family Medicine

## 2018-02-21 ENCOUNTER — Ambulatory Visit (HOSPITAL_BASED_OUTPATIENT_CLINIC_OR_DEPARTMENT_OTHER): Payer: No Typology Code available for payment source | Admitting: Pain Medicine

## 2018-02-21 VITALS — BP 94/70 | HR 79 | Temp 97.6°F | Resp 18 | Ht 68.0 in | Wt 197.0 lb

## 2018-02-21 DIAGNOSIS — M5030 Other cervical disc degeneration, unspecified cervical region: Secondary | ICD-10-CM

## 2018-02-21 DIAGNOSIS — M5412 Radiculopathy, cervical region: Secondary | ICD-10-CM | POA: Insufficient documentation

## 2018-02-21 DIAGNOSIS — G8928 Other chronic postprocedural pain: Secondary | ICD-10-CM | POA: Diagnosis present

## 2018-02-21 DIAGNOSIS — M5010 Cervical disc disorder with radiculopathy, unspecified cervical region: Secondary | ICD-10-CM | POA: Diagnosis present

## 2018-02-21 DIAGNOSIS — M4722 Other spondylosis with radiculopathy, cervical region: Secondary | ICD-10-CM | POA: Diagnosis present

## 2018-02-21 DIAGNOSIS — M542 Cervicalgia: Secondary | ICD-10-CM | POA: Diagnosis present

## 2018-02-21 DIAGNOSIS — R937 Abnormal findings on diagnostic imaging of other parts of musculoskeletal system: Secondary | ICD-10-CM | POA: Diagnosis present

## 2018-02-21 DIAGNOSIS — Z9889 Other specified postprocedural states: Secondary | ICD-10-CM

## 2018-02-21 DIAGNOSIS — M4802 Spinal stenosis, cervical region: Secondary | ICD-10-CM | POA: Insufficient documentation

## 2018-02-21 DIAGNOSIS — M79602 Pain in left arm: Secondary | ICD-10-CM

## 2018-02-21 DIAGNOSIS — R29898 Other symptoms and signs involving the musculoskeletal system: Secondary | ICD-10-CM | POA: Diagnosis present

## 2018-02-21 DIAGNOSIS — G8929 Other chronic pain: Secondary | ICD-10-CM | POA: Diagnosis present

## 2018-02-21 MED ORDER — ROPIVACAINE HCL 2 MG/ML IJ SOLN
1.0000 mL | Freq: Once | INTRAMUSCULAR | Status: AC
  Administered 2018-02-21: 10 mL via EPIDURAL
  Filled 2018-02-21: qty 10

## 2018-02-21 MED ORDER — IOPAMIDOL (ISOVUE-M 200) INJECTION 41%
10.0000 mL | Freq: Once | INTRAMUSCULAR | Status: AC
  Administered 2018-02-21: 10 mL via EPIDURAL
  Filled 2018-02-21: qty 10

## 2018-02-21 MED ORDER — TRIAMCINOLONE ACETONIDE 40 MG/ML IJ SUSP
40.0000 mg | Freq: Once | INTRAMUSCULAR | Status: AC
  Administered 2018-02-21: 40 mg
  Filled 2018-02-21: qty 1

## 2018-02-21 MED ORDER — MIDAZOLAM HCL 5 MG/5ML IJ SOLN
1.0000 mg | INTRAMUSCULAR | Status: DC | PRN
  Administered 2018-02-21: 2 mg via INTRAVENOUS
  Filled 2018-02-21: qty 5

## 2018-02-21 MED ORDER — FENTANYL CITRATE (PF) 100 MCG/2ML IJ SOLN
25.0000 ug | INTRAMUSCULAR | Status: DC | PRN
  Administered 2018-02-21: 50 ug via INTRAVENOUS
  Filled 2018-02-21: qty 2

## 2018-02-21 MED ORDER — LIDOCAINE HCL 2 % IJ SOLN
20.0000 mL | Freq: Once | INTRAMUSCULAR | Status: AC
  Administered 2018-02-21: 400 mg
  Filled 2018-02-21: qty 40

## 2018-02-21 MED ORDER — SODIUM CHLORIDE 0.9% FLUSH
1.0000 mL | Freq: Once | INTRAVENOUS | Status: AC
  Administered 2018-02-21: 10 mL

## 2018-02-21 MED ORDER — DEXAMETHASONE SODIUM PHOSPHATE 10 MG/ML IJ SOLN
10.0000 mg | Freq: Once | INTRAMUSCULAR | Status: AC
  Administered 2018-02-21: 10 mg
  Filled 2018-02-21: qty 1

## 2018-02-21 MED ORDER — LACTATED RINGERS IV SOLN
1000.0000 mL | Freq: Once | INTRAVENOUS | Status: AC
  Administered 2018-02-21: 1000 mL via INTRAVENOUS

## 2018-02-21 NOTE — Progress Notes (Signed)
Safety precautions to be maintained throughout the outpatient stay will include: orient to surroundings, keep bed in low position, maintain call bell within reach at all times, provide assistance with transfer out of bed and ambulation.  

## 2018-02-21 NOTE — Progress Notes (Signed)
Patient's Name: Caleb [name]  MRN: [id number]  Referring Provider: Milinda Pointer, MD  DOB: 1955/06/22  PCP: Virginia Crews, MD  DOS: 02/21/2018  Note by: Gaspar Cola, MD  Service setting: Ambulatory outpatient  Specialty: Interventional Pain Management  Patient type: Established  Location: [facility] (AMB) Pain Management Facility  Visit type: Interventional Procedure   Primary Reason for Visit: Interventional Pain Management Treatment. CC: No chief complaint on file.  Procedure:          Anesthesia, Analgesia, Anxiolysis:  Type: Palliative, Inter-Laminar, Cervical Epidural Steroid Injection  #1  Region: Posterior Cervico-thoracic Region Level: C7-T1 Laterality: Left-Sided Paramedial  Type: Moderate (Conscious) Sedation combined with Local Anesthesia Indication(s): Analgesia and Anxiety Route: Intravenous (IV) IV Access: Secured Sedation: Meaningful verbal contact was maintained at all times during the procedure  Local Anesthetic: Lidocaine 1-2%  Position: Prone with head of the table was raised to facilitate breathing.   Indications: 1. DDD (degenerative disc disease), cervical   2. Cervicalgia (Bilateral) (L>R)   3. 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 Chronic neck pain with history of cervical spinal surgery   5. Cervical disc disorder with radiculopathy of cervical region   6. Chronic upper extremity weakness (Left)   7. Chronic upper extremity pain (Left)   8. Abnormal MRI, cervical spine (08/13/2017)    Pain Score: Pre-procedure: 5 /10 Post-procedure: 0-No pain/10  Pre-op Assessment:  Caleb [name] is a 63 y.o. (year old), male patient, seen today for interventional treatment. He  has a past surgical history that includes Cardiac catheterization (2015); Lumbar laminectomy (1989, 1999); Cervical fusion (1988, 1998); Liver surgery (2015); Partial colectomy (1990); Inguinal hernia repair (Bilateral, 1975); Lithotripsy; and Colonoscopy with  propofol (N/A, 02/24/2017). Caleb [name] has a current medication list which includes the following prescription(s): aspirin ec, fluticasone, hydrochlorothiazide, hydrocodone-acetaminophen, hydrocodone-acetaminophen, isosorbide mononitrate, losartan, methocarbamol, metoprolol tartrate, nitroglycerin, pregabalin, rosuvastatin, secukinumab, sertraline, tiotropium, triamcinolone ointment, varenicline, and proventil hfa, and the following Facility-Administered Medications: fentanyl and midazolam. His primarily concern today is the No chief complaint on file.  Initial Vital Signs:  Pulse/HCG Rate: 79ECG Heart Rate: 64 Temp: 97.9 F (36.6 C) Resp: 18 BP: 120/73 SpO2: 97 %  BMI: Estimated body mass index is 29.95 kg/m as calculated from the following:   Height as of this encounter: 5\' 8"  (1.727 m).   Weight as of this encounter: 197 lb (89.4 kg).  Risk Assessment: Allergies: Reviewed. He is allergic to lisinopril.  Allergy Precautions: None required Coagulopathies: Reviewed. None identified.  Blood-thinner therapy: None at this time Active Infection(s): Reviewed. None identified. Caleb [name] is afebrile  Site Confirmation: [name] was asked to confirm the procedure and laterality before marking the site Procedure checklist: Completed Consent: Before the procedure and under the influence of no sedative(s), amnesic(s), or anxiolytics, the patient was informed of the treatment options, risks and possible complications. To fulfill our ethical and legal obligations, as recommended by the American Medical Association's Code of Ethics, I have informed the patient of my clinical impression; the nature and purpose of the treatment or procedure; the risks, benefits, and possible complications of the intervention; the alternatives, including doing nothing; the risk(s) and benefit(s) of the alternative treatment(s) or procedure(s); and the risk(s) and benefit(s) of doing nothing. The patient was provided  information about the general risks and possible complications associated with the procedure. These may include, but are not limited to: failure to achieve desired goals, infection, bleeding, organ or nerve damage, allergic reactions, paralysis, and death.  In addition, the patient was informed of those risks and complications associated to Spine-related procedures, such as failure to decrease pain; infection (i.e.: Meningitis, epidural or intraspinal abscess); bleeding (i.e.: epidural hematoma, subarachnoid hemorrhage, or any other type of intraspinal or peri-dural bleeding); organ or nerve damage (i.e.: Any type of peripheral nerve, nerve root, or spinal cord injury) with subsequent damage to sensory, motor, and/or autonomic systems, resulting in permanent pain, numbness, and/or weakness of one or several areas of the body; allergic reactions; (i.e.: anaphylactic reaction); and/or death. Furthermore, the patient was informed of those risks and complications associated with the medications. These include, but are not limited to: allergic reactions (i.e.: anaphylactic or anaphylactoid reaction(s)); adrenal axis suppression; blood sugar elevation that in diabetics may result in ketoacidosis or comma; water retention that in patients with history of congestive heart failure may result in shortness of breath, pulmonary edema, and decompensation with resultant heart failure; weight gain; swelling or edema; medication-induced neural toxicity; particulate matter embolism and blood vessel occlusion with resultant organ, and/or nervous system infarction; and/or aseptic necrosis of one or more joints. Finally, the patient was informed that Medicine is not an exact science; therefore, there is also the possibility of unforeseen or unpredictable risks and/or possible complications that may result in a catastrophic outcome. The patient indicated having understood very clearly. We have given the patient no guarantees and we  have made no promises. Enough time was given to the patient to ask questions, all of which were answered to the patient's satisfaction. Caleb [name] has indicated that he wanted to continue with the procedure. Attestation: I, the ordering provider, attest that I have discussed with the patient the benefits, risks, side-effects, alternatives, likelihood of achieving goals, and potential problems during recovery for the procedure that I have provided informed consent. Date  Time: 02/21/2018  9:30 AM  Pre-Procedure Preparation:  Monitoring: As per clinic protocol. Respiration, ETCO2, SpO2, BP, heart rate and rhythm monitor placed and checked for adequate function Safety Precautions: Patient was assessed for positional comfort and pressure points before starting the procedure. Time-out: I initiated and conducted the "Time-out" before starting the procedure, as per protocol. The patient was asked to participate by confirming the accuracy of the "Time Out" information. Verification of the correct person, site, and procedure were performed and confirmed by me, the nursing staff, and the patient. "Time-out" conducted as per Joint Commission's Universal Protocol (UP.01.01.01). Time: 1014  Description of Procedure:          Target Area: For Epidural Steroid injections the target is the interlaminar space, initially targeting the lower border of the superior vertebral body lamina. Approach: Paramedial approach. Area Prepped: Entire PosteriorCervical Region Prepping solution: ChloraPrep (2% chlorhexidine gluconate and 70% isopropyl alcohol) Safety Precautions: Aspiration looking for blood return was conducted prior to all injections. At no point did we inject any substances, as a needle was being advanced. No attempts were made at seeking any paresthesias. Safe injection practices and needle disposal techniques used. Medications properly checked for expiration dates. SDV (single dose vial) medications  used. Description of the Procedure: Protocol guidelines were followed. The procedure needle was introduced through the skin, ipsilateral to the reported pain, and advanced to the target area. Bone was contacted and the needle walked caudad, until the lamina was cleared. The epidural space was identified using "loss-of-resistance technique" with 2-3 ml of PF-NaCl (0.9% NSS), in a 5cc LOR glass syringe. Vitals:   02/21/18 1019 02/21/18 1029 02/21/18 1039 02/21/18 1049  BP: 112/86  94/65 (!) 81/72 94/70  Pulse:      Resp: 17 18 17 18   Temp:  97.8 F (36.6 C)  97.6 F (36.4 C)  TempSrc:      SpO2: 94% 98% 97% 98%  Weight:      Height:        Start Time: 1014 hrs. End Time: 1019 hrs. Materials:  Needle(s) Type: Epidural needle Gauge: 17G Length: 3.5-in Medication(s): Please see orders for medications and dosing details.  Imaging Guidance (Spinal):          Type of Imaging Technique: Fluoroscopy Guidance (Spinal) Indication(s): Assistance in needle guidance and placement for procedures requiring needle placement in or near specific anatomical locations not easily accessible without such assistance. Exposure Time: Please see nurses notes. Contrast: Before injecting any contrast, we confirmed that the patient did not have an allergy to iodine, shellfish, or radiological contrast. Once satisfactory needle placement was completed at the desired level, radiological contrast was injected. Contrast injected under live fluoroscopy. No contrast complications. See chart for type and volume of contrast used. Fluoroscopic Guidance: I was personally present during the use of fluoroscopy. "Tunnel Vision Technique" used to obtain the best possible view of the target area. Parallax error corrected before commencing the procedure. "Direction-depth-direction" technique used to introduce the needle under continuous pulsed fluoroscopy. Once target was reached, antero-posterior, oblique, and lateral fluoroscopic  projection used confirm needle placement in all planes. Images permanently stored in EMR. Interpretation: I personally interpreted the imaging intraoperatively. Adequate needle placement confirmed in multiple planes. Appropriate spread of contrast into desired area was observed. No evidence of afferent or efferent intravascular uptake. No intrathecal or subarachnoid spread observed. Permanent images saved into the patient's record.  Antibiotic Prophylaxis:   Anti-infectives (From admission, onward)   None     Indication(s): None identified  Post-operative Assessment:  Post-procedure Vital Signs:  Pulse/HCG Rate: 7965 Temp: 97.6 F (36.4 C) Resp: 18 BP: 94/70 SpO2: 98 %  EBL: None  Complications: No immediate post-treatment complications observed by team, or reported by patient.  Note: The patient tolerated the entire procedure well. A repeat set of vitals were taken after the procedure and the patient was kept under observation following institutional policy, for this type of procedure. Post-procedural neurological assessment was performed, showing return to baseline, prior to discharge. The patient was provided with post-procedure discharge instructions, including a section on how to identify potential problems. Should any problems arise concerning this procedure, the patient was given instructions to immediately contact us, at any time, without hesitation. In any case, we plan to contact the patient by telephone for a follow-up status report regarding this interventional procedure.  Comments:  No additional relevant information.  Plan of Care    Imaging Orders     DG C-Arm 1-60 Min-No Report  Procedure Orders     Cervical Epidural Injection  Medications ordered for procedure: Meds ordered this encounter  Medications  . iopamidol (ISOVUE-M) 41 % intrathecal injection 10 mL    Must be Myelogram-compatible. If not available, you may substitute with a water-soluble, non-ionic,  hypoallergenic, myelogram-compatible radiological contrast medium.  Marland Kitchen lidocaine (XYLOCAINE) 2 % (with pres) injection 400 mg  . midazolam (VERSED) 5 MG/5ML injection 1-2 mg    Make sure Flumazenil is available in the pyxis when using this medication. If oversedation occurs, administer 0.2 mg IV over 15 sec. If after 45 sec no response, administer 0.2 mg again over 1 min; may repeat at 1 min intervals; not to  exceed 4 doses (1 mg)  . fentaNYL (SUBLIMAZE) injection 25-50 mcg    Make sure Narcan is available in the pyxis when using this medication. In the event of respiratory depression (RR< 8/min): Titrate NARCAN (naloxone) in increments of 0.1 to 0.2 mg IV at 2-3 minute intervals, until desired degree of reversal.  . lactated ringers infusion 1,000 mL  . sodium chloride flush (NS) 0.9 % injection 1 mL  . ropivacaine (PF) 2 mg/mL (0.2%) (NAROPIN) injection 1 mL  . dexamethasone (DECADRON) injection 10 mg  . triamcinolone acetonide (KENALOG-40) injection 40 mg   Medications administered: We administered iopamidol, lidocaine, midazolam, fentaNYL, lactated ringers, sodium chloride flush, ropivacaine (PF) 2 mg/mL (0.2%), dexamethasone, and triamcinolone acetonide.  See the medical record for exact dosing, route, and time of administration.  Disposition: Discharge home  Discharge Date & Time: 02/21/2018; 1050 hrs.   Physician-requested Follow-up: Return for PPE (2 wks), w/ Dr. Dossie Arbour.  Future Appointments  Date Time Provider [place]  03/08/2018  8:00 AM Brita Romp, Dionne Bucy, MD [facility] None  03/13/2018  9:15 AM Milinda Pointer, MD [facility] None  03/15/2018 10:30 AM Rise Mu, PA-C [id number]  04/04/2018  1:45 PM Billey Co, MD [facility] None  04/06/2018  8:15 AM Vevelyn Francois, NP [facility] None  06/15/2018  9:40 AM Brita Romp, Dionne Bucy, MD [facility] None   Primary Care Physician: Virginia Crews, MD Location: [facility] Note by:  Gaspar Cola, MD Date: 02/21/2018; Time: 12:32 PM  Disclaimer:  Medicine is not an Chief Strategy Officer. The only guarantee in medicine is that nothing is guaranteed. It is important to note that the decision to proceed with this intervention was based on the information collected from the patient. The Data and conclusions were drawn from the patient's questionnaire, the interview, and the physical examination. Because the information was provided in large part by the patient, it cannot be guaranteed that it has not been purposely or unconsciously manipulated. Every effort has been made to obtain as much relevant data as possible for this evaluation. It is important to note that the conclusions that lead to this procedure are derived in large part from the available data. Always take into account that the treatment will also be dependent on availability of resources and existing treatment guidelines, considered by other Pain Management Practitioners as being common knowledge and practice, at the time of the intervention. For Medico-Legal purposes, it is also important to point out that variation in procedural techniques and pharmacological choices are the acceptable norm. The indications, contraindications, technique, and results of the above procedure should only be interpreted and judged by a Board-Certified Interventional Pain Specialist with extensive familiarity and expertise in the same exact procedure and technique.

## 2018-02-21 NOTE — Patient Instructions (Signed)

## 2018-02-22 ENCOUNTER — Telehealth: Payer: Self-pay

## 2018-02-22 NOTE — Telephone Encounter (Signed)
Post procedure phone call.  Patient states he is doing fine.  

## 2018-02-27 ENCOUNTER — Other Ambulatory Visit: Payer: Self-pay | Admitting: Family Medicine

## 2018-03-06 DIAGNOSIS — L400 Psoriasis vulgaris: Secondary | ICD-10-CM | POA: Diagnosis not present

## 2018-03-08 ENCOUNTER — Ambulatory Visit: Payer: Medicaid Other | Admitting: Family Medicine

## 2018-03-08 ENCOUNTER — Encounter: Payer: Self-pay | Admitting: Family Medicine

## 2018-03-08 VITALS — BP 107/71 | HR 60 | Temp 97.7°F | Wt 191.8 lb

## 2018-03-08 DIAGNOSIS — R1032 Left lower quadrant pain: Secondary | ICD-10-CM | POA: Insufficient documentation

## 2018-03-08 DIAGNOSIS — F411 Generalized anxiety disorder: Secondary | ICD-10-CM | POA: Diagnosis not present

## 2018-03-08 DIAGNOSIS — R457 State of emotional shock and stress, unspecified: Secondary | ICD-10-CM | POA: Diagnosis not present

## 2018-03-08 DIAGNOSIS — F331 Major depressive disorder, recurrent, moderate: Secondary | ICD-10-CM

## 2018-03-08 MED ORDER — FLUTICASONE PROPIONATE 50 MCG/ACT NA SUSP: 2.0000 | Freq: Every day | NASAL | 3 refills | Status: DC

## 2018-03-08 MED ORDER — CETIRIZINE HCL 10 MG PO TABS: 10.0000 mg | ORAL_TABLET | Freq: Every day | ORAL | 3 refills | Status: DC

## 2018-03-08 MED ORDER — SERTRALINE HCL 100 MG PO TABS: 100.0000 mg | ORAL_TABLET | Freq: Every day | ORAL | 3 refills | Status: DC

## 2018-03-08 NOTE — Assessment & Plan Note (Signed)
Chronic and stable Improved significantly on zoloft Continue Zoloft 100mg  daily

## 2018-03-08 NOTE — Assessment & Plan Note (Signed)
Ongoing stress from caring from caring from his elderly demented father Discussed importance of therapy Continue zoloft at current dose

## 2018-03-08 NOTE — Assessment & Plan Note (Addendum)
Mild and subacute Has seen urology and nephrolithiasis ruled out Is having regular BMs, but suspect still having some constipation due to chronic opioid use Advised to add Miralax to his bowel regimen Return precautions discussed

## 2018-03-08 NOTE — Assessment & Plan Note (Signed)
Doing well  Improved and stable Discussed importance of therapy Continue zoloft at current dose

## 2018-03-08 NOTE — Progress Notes (Signed)
Patient: Caleb [name] Male    DOB: 04/27/1955   63 y.o.   MRN: [id number] Visit Date: 03/08/2018  Today's Provider: Lavon Paganini, MD   Chief Complaint  Patient presents with  . Anxiety  . Depression   Subjective:    I, [name], CMA, am acting as a scribe for Lavon Paganini, MD.    HPI Anxiety & Depression:  Patient presents for a follow up. Last OV was on 01/24/2018. Patient started Zoloft 100 mg. He reports good compliance with treatment plan. He states symptoms have improved.   Depression screen [facility] 2/9 02/21/2018 02/06/2018 01/24/2018  Decreased Interest 0 0 3  Down, Depressed, Hopeless 0 0 3  PHQ - 2 Score 0 0 6  Altered sleeping - - 3  Tired, decreased energy - - 3  Change in appetite - - 3  Feeling bad or failure about yourself  - - 0  Trouble concentrating - - 0  Moving slowly or fidgety/restless - - 3  Suicidal thoughts - - 0  PHQ-9 Score - - 18  Difficult doing work/chores - - Somewhat difficult   GAD 7 : Generalized Anxiety Score 01/24/2018  Nervous, Anxious, on Edge 3  Control/stop worrying 1  Worry too much - different things 1  Trouble relaxing 3  Restless 3  Easily annoyed or irritable 3  Afraid - awful might happen 0  Total GAD 7 Score 14  Anxiety Difficulty Somewhat difficult    Patient reports ongoing LLQ pain x6 wks Saw urology who did CT Renal stone study No obstructing stone - was reassured States that he is having 1 bowel movement daily.  He is taking a stool softener as he is on chronic narcotics Denies any diarrhea, fever.  Does have history of diverticulitis requiring partial colectomy in the past Last colonoscopy 02/2017 revealed no diverticula and no polyps   Allergies  Allergen Reactions  . Lisinopril Cough     Current Outpatient Medications:  .  aspirin EC 81 MG tablet, Take 81 mg by mouth daily., Disp: , Rfl:  .  fluticasone (FLOVENT HFA) 220 MCG/ACT inhaler, Inhale 1 puff 2 (two) times daily into the lungs.,  Disp: 1 Inhaler, Rfl: 5 .  hydrochlorothiazide (HYDRODIURIL) 25 MG tablet, TAKE 1 TABLET BY MOUTH EVERY DAY (Patient taking differently: Take 25 mg by mouth daily. ), Disp: 30 tablet, Rfl: 11 .  HYDROcodone-acetaminophen (NORCO/VICODIN) 5-325 MG tablet, Take 1 tablet by mouth every 6 (six) hours as needed for severe pain., Disp: 120 tablet, Rfl: 0 .  [START ON 03/22/2018] HYDROcodone-acetaminophen (NORCO/VICODIN) 5-325 MG tablet, Take 1 tablet by mouth every 6 (six) hours as needed for moderate pain., Disp: 120 tablet, Rfl: 0 .  isosorbide mononitrate (IMDUR) 30 MG 24 hr tablet, TAKE 0.5 TABLET(S) EVERY DAY BY ORAL ROUTE IN THE MORNING. (Patient taking differently: Take 15 mg by mouth daily. ), Disp: 15 tablet, Rfl: 5 .  losartan (COZAAR) 100 MG tablet, TAKE 1 TABLET BY MOUTH EVERY DAY, Disp: 30 tablet, Rfl: 5 .  methocarbamol (ROBAXIN) 750 MG tablet, Take 1 tablet (750 mg total) by mouth every 8 (eight) hours as needed for muscle spasms., Disp: 90 tablet, Rfl: 5 .  metoprolol tartrate (LOPRESSOR) 25 MG tablet, TAKE 1 TABLET BY MOUTH TWICE A DAY, Disp: 60 tablet, Rfl: 11 .  nitroGLYCERIN (NITROSTAT) 0.4 MG SL tablet, Place 1 tablet (0.4 mg total) under the tongue every 5 (five) minutes as needed for chest pain., Disp: 30  tablet, Rfl: 3 .  pregabalin (LYRICA) 25 MG capsule, Take 1 capsule (25 mg total) by mouth 3 (three) times daily., Disp: 90 capsule, Rfl: 2 .  PROVENTIL HFA 108 (90 Base) MCG/ACT inhaler, INHALE 1-2 PUFFS EVERY 6 (SIX) HOURS AS NEEDED INTO THE LUNGS FOR WHEEZING OR SHORTNESS OF BREATH., Disp: 6.7 Inhaler, Rfl: 5 .  rosuvastatin (CRESTOR) 40 MG tablet, TAKE 1 TABLET BY MOUTH EVERY DAY (Patient taking differently: Take 40 mg by mouth daily. ), Disp: 90 tablet, Rfl: 3 .  Secukinumab (COSENTYX, 300 MG DOSE, [place]), Inject 300 mg into the skin every 30 (thirty) days. , Disp: , Rfl:  .  sertraline (ZOLOFT) 100 MG tablet, Take 1 tablet (100 mg total) by mouth daily., Disp: 30 tablet, Rfl: 3 .   tiotropium (SPIRIVA) 18 MCG inhalation capsule, Place 1 capsule (18 mcg total) daily into inhaler and inhale., Disp: 30 capsule, Rfl: 5 .  triamcinolone ointment (KENALOG) 0.5 %, Apply 1 application 2 (two) times daily topically. On psoriasis lesions, Disp: 30 g, Rfl: 2 .  varenicline (CHANTIX STARTING MONTH PAK) 0.5 MG X 11 & 1 MG X 42 tablet, Take one 0.5 mg tablet by mouth once daily for 3 days, then increase to one 0.5 mg tablet twice daily for 4 days, then increase to one 1 mg tablet twice daily., Disp: 53 tablet, Rfl: 0  Review of Systems  Constitutional: Negative.   Respiratory: Negative.   Cardiovascular: Negative.   Gastrointestinal: Positive for abdominal pain.  Musculoskeletal: Negative.     Social History   Tobacco Use  . Smoking status: Current Every Day Smoker    Packs/day: 0.50    Years: 49.00    Pack years: 24.50    Types: Cigarettes  . Smokeless tobacco: Never Used  . Tobacco comment: started smoking at age ; has decreased cigarette use from 2 PPD to 0.25 to 0.5 PPD  Substance Use Topics  . Alcohol use: Yes    Alcohol/week: 4.0 standard drinks    Types: 4 Cans of beer per week      Objective:   BP 107/71 (BP Location: Right Arm, Patient Position: Sitting, Cuff Size: Large)   Pulse 60   Temp 97.7 F (36.5 C) (Oral)   Wt 191 lb 12.8 oz (87 kg)   SpO2 94%   BMI 29.16 kg/m  Vitals:   03/08/18 0801  BP: 107/71  Pulse: 60  Temp: 97.7 F (36.5 C)  TempSrc: Oral  SpO2: 94%  Weight: 191 lb 12.8 oz (87 kg)     Physical Exam Vitals signs reviewed.  Constitutional:      General: He is not in acute distress.    Appearance: Normal appearance. He is not diaphoretic.  HENT:     Head: Normocephalic and atraumatic.     Mouth/Throat:     Pharynx: Oropharynx is clear.  Eyes:     General: No scleral icterus.    Conjunctiva/sclera: Conjunctivae normal.  Neck:     Musculoskeletal: Neck supple.  Cardiovascular:     Rate and Rhythm: Normal rate and regular  rhythm.     Pulses: Normal pulses.     Heart sounds: Normal heart sounds. No murmur.  Pulmonary:     Effort: Pulmonary effort is normal. No respiratory distress.     Breath sounds: Normal breath sounds. No wheezing or rhonchi.  Abdominal:     General: Abdomen is flat. Bowel sounds are normal.     Palpations: Abdomen is soft.  Tenderness: There is abdominal tenderness in the left lower quadrant. There is no right CVA tenderness, left CVA tenderness, guarding or rebound.  Musculoskeletal:     Right lower leg: No edema.     Left lower leg: No edema.  Lymphadenopathy:     Cervical: No cervical adenopathy.  Skin:    General: Skin is warm and dry.     Capillary Refill: Capillary refill takes less than 2 seconds.     Findings: No rash.  Neurological:     Mental Status: He is alert and oriented to person, place, and time. Mental status is at baseline.  Psychiatric:        Mood and Affect: Mood normal.        Behavior: Behavior normal.        Assessment & Plan   Problem List Items Addressed This Visit      Other   Caregiver stress syndrome    Ongoing stress from caring from caring from his elderly demented father Discussed importance of therapy Continue zoloft at current dose       Moderate episode of recurrent major depressive disorder (HCC)    Chronic and stable Improved significantly on zoloft Continue Zoloft 100mg  daily      Relevant Medications   sertraline (ZOLOFT) 100 MG tablet   GAD (generalized anxiety disorder) - Primary    Doing well  Improved and stable Discussed importance of therapy Continue zoloft at current dose      Relevant Medications   sertraline (ZOLOFT) 100 MG tablet   LLQ abdominal pain    Mild and subacute Has seen urology and nephrolithiasis ruled out Is having regular BMs, but suspect still having some constipation due to chronic opioid use Advised to add Miralax to his bowel regimen Return precautions discussed          Return  in about 3 months (around 06/08/2018) for chronic disease f/u.   The entirety of the information documented in the History of Present Illness, Review of Systems and Physical Exam were personally obtained by me. Portions of this information were initially documented by [name], CMA and reviewed by me for thoroughness and accuracy.    Virginia Crews, MD, MPH [facility] 03/08/2018 11:20 AM

## 2018-03-12 NOTE — Progress Notes (Signed)
Patient's Name: Caleb [name]  MRN: [id number]  Referring Provider: Virginia Crews, MD  DOB: 29-Sep-1955  PCP: Virginia Crews, MD  DOS: 03/13/2018  Note by: Gaspar Cola, MD  Service setting: Ambulatory outpatient  Specialty: Interventional Pain Management  Location: [facility] (AMB) Pain Management Facility    Patient type: Established   Primary Reason(s) for Visit: Encounter for post-procedure evaluation of chronic illness with mild to moderate exacerbation CC: No chief complaint on file.  HPI  Caleb [name] is a 63 y.o. year old, male patient, who comes today for a post-procedure evaluation. He has OSA (obstructive sleep apnea); Myocardial infarction ([place]); Hypertension; Hyperlipidemia; COPD (chronic obstructive pulmonary disease) ([place]); Chronic foot pain (Primary Area of Pain) (Right); Tobacco abuse; Erectile dysfunction; CAD (coronary artery disease), native coronary artery; Chronic ankle pain (Secondary Area of Pain) (Right); Chronic thoracic back pain [facility] Area of Pain) (Midline); Chronic pain syndrome; Long term current use of opiate analgesic; Pharmacologic therapy; Disorder of skeletal system; Problems influencing health status; Elevated C-reactive protein (CRP); Elevated sed rate; 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); Spondylosis without myelopathy or radiculopathy, cervicothoracic region; Chronic musculoskeletal pain; Neurogenic foot pain (Right); DDD (degenerative disc disease), cervical; 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, 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); Cervicalgia (Bilateral) (L>R); DDD (degenerative disc disease), thoracic; Disorder of superficial peroneal nerve (Right); Chronic upper extremity pain (Left); Cervical spondylosis w/ radiculopathy; Cervical disc disorder with radiculopathy of cervical region; Chronic upper extremity weakness (Left); Abnormal nerve conduction studies (06/08/2017); 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 His primarily concern today is the No chief complaint on file.  Pain Assessment: Location: Upper Neck Radiating: denies today Onset: More than a month ago Duration: Chronic pain Quality: Burning Severity: 1 /10 (subjective, self-reported pain score)  Note: Reported level is compatible with observation.                               Timing: Intermittent Modifying factors: procedures, medications BP: 118/73  HR: 63  Caleb [name] comes in today for post-procedure evaluation.  The patient returns to the clinic today after having had his left-sided cervical epidural steroid injection done approximately 2 weeks ago.  He indicates 90% relief of his pain that seems to be still ongoing.  He is very happy about it.  He says that he still has pain, but he fears that he will always have some degree of pain, which I tend to agree with him.  The patient indicates having attained better relief with than with the other cervical epidural steroid injections.  The only thing that I did do different was that instead of injecting only 10 mg of Decadron, I also added 40 mg of triamcinolone.  This combination seems to have worked wonders for him.  He indicates no side effects from it.  Further details on both, my assessment(s), as well as the proposed treatment plan, please see below.  Post-Procedure Assessment  02/21/2018 Procedure: Palliative left-sided cervical epidural steroid injection (#5 CESI, #1 palliative) under fluoroscopic guidance and IV sedation Pre-procedure pain score:   5/10 Post-procedure pain score: 0/10 (100% relief) Influential  Factors: BMI: 29.04 kg/m Intra-procedural challenges: None observed.         Assessment challenges: None detected.              Reported side-effects: None.        Post-procedural adverse reactions or complications: None reported         Sedation: Sedation provided. When no sedatives are used, the analgesic levels obtained are directly associated to the effectiveness of the local anesthetics. However, when sedation is provided, the level of analgesia obtained during the initial 1 hour following the intervention, is believed to be the result of a combination of factors. These factors may include, but are not limited to: 1. The effectiveness of the local anesthetics used. 2. The effects of the analgesic(s) and/or anxiolytic(s) used. 3. The degree of discomfort experienced by the patient at the time of the procedure. 4. The patients ability and reliability in recalling and recording the events. 5. The presence and influence of possible secondary gains and/or psychosocial factors. Reported result: Relief experienced during the 1st hour after the procedure: 100 % (Ultra-Short Term Relief) Caleb [name] has indicated area to have been numb during this time. Interpretative annotation: Clinically appropriate result. Analgesia during this period is likely to be Local Anesthetic and/or IV Sedative (Analgesic/Anxiolytic) related.          Effects of local anesthetic: The analgesic effects attained during this period are directly associated to the localized infiltration of local anesthetics and therefore cary significant diagnostic value as to the etiological location, or anatomical origin, of the pain. Expected duration of relief is directly dependent on the pharmacodynamics of the local anesthetic used. Long-acting (4-6 hours) anesthetics used.  Reported result: Relief during the next 4 to 6 hour after the procedure: 100 % (Short-Term Relief) Mr.  [name] has indicated area to have been numb during this time. Interpretative annotation: Clinically appropriate result. Analgesia during this period is likely to be Local Anesthetic-related.          Long-term benefit: Defined as the period of time past the expected duration of local anesthetics (1 hour for short-acting and 4-6 hours for long-acting). With the possible exception of prolonged sympathetic blockade from the local anesthetics, benefits during this period are typically attributed to, or associated with, other factors such as analgesic sensory neuropraxia, antiinflammatory effects, or beneficial biochemical changes provided by agents other than the local anesthetics.  Reported result: Extended relief following procedure: 90 % (Long-Term Relief)            Interpretative annotation: Clinically possible results. Good relief. No permanent benefit expected. Inflammation plays a part in the etiology to the pain.          Current benefits: Defined as reported results that persistent at this point in time.   Analgesia: 90 % Caleb [name] reports improvement of axial and extremity symptoms. Function: Caleb [name] reports improvement in function ROM: Caleb [name] reports improvement in ROM Interpretative annotation: Ongoing benefit. Therapeutic benefit observed. Effective therapeutic approach. Benefit could be steroid-related.  Interpretation: Results would suggest a successful therapeutic intervention.                  Plan:  Set up procedure as a PRN palliative treatment option for this patient.                Laboratory Chemistry  Inflammation Markers (CRP: Acute Phase) (ESR: Chronic Phase) Lab Results  Component Value Date   CRP 8 11/07/2017   ESRSEDRATE 26  11/07/2017                         Renal Markers Lab Results  Component Value Date   BUN 16 02/09/2018   CREATININE 0.87 02/09/2018   BCR 22 12/14/2017   GFRAA >60 02/09/2018   GFRNONAA >60 02/09/2018                              Hepatic Markers Lab Results  Component Value Date   AST 21 02/09/2018   ALT 20 02/09/2018   ALBUMIN 3.6 02/09/2018                        Note: Lab results reviewed.  Recent Imaging Results   Results for orders placed in visit on 02/21/18  DG C-Arm 1-60 Min-No Report   Narrative Fluoroscopy was utilized by the requesting physician.  No radiographic  interpretation.    Interpretation Report: Fluoroscopy was used during the procedure to assist with needle guidance. The images were interpreted intraoperatively by the requesting physician.        Meds   Current Outpatient Medications:  .  aspirin EC 81 MG tablet, Take 81 mg by mouth daily., Disp: , Rfl:  .  cetirizine (ZYRTEC) 10 MG tablet, Take 1 tablet (10 mg total) by mouth daily., Disp: 90 tablet, Rfl: 3 .  fluticasone (FLONASE) 50 MCG/ACT nasal spray, Place 2 sprays into both nostrils daily., Disp: 48 g, Rfl: 3 .  fluticasone (FLOVENT HFA) 220 MCG/ACT inhaler, Inhale 1 puff 2 (two) times daily into the lungs., Disp: 1 Inhaler, Rfl: 5 .  hydrochlorothiazide (HYDRODIURIL) 25 MG tablet, TAKE 1 TABLET BY MOUTH EVERY DAY (Patient taking differently: Take 25 mg by mouth daily. ), Disp: 30 tablet, Rfl: 11 .  HYDROcodone-acetaminophen (NORCO/VICODIN) 5-325 MG tablet, Take 1 tablet by mouth every 6 (six) hours as needed for severe pain., Disp: 120 tablet, Rfl: 0 .  [START ON 03/22/2018] HYDROcodone-acetaminophen (NORCO/VICODIN) 5-325 MG tablet, Take 1 tablet by mouth every 6 (six) hours as needed for moderate pain., Disp: 120 tablet, Rfl: 0 .  isosorbide mononitrate (IMDUR) 30 MG 24 hr tablet, TAKE 0.5 TABLET(S) EVERY DAY BY ORAL ROUTE IN THE MORNING. (Patient taking differently: Take 15 mg by mouth daily. ), Disp: 15 tablet, Rfl: 5 .  losartan (COZAAR) 100 MG tablet, TAKE 1 TABLET BY MOUTH EVERY DAY, Disp: 30 tablet, Rfl: 5 .  methocarbamol (ROBAXIN) 750 MG tablet, Take 1 tablet (750 mg total) by mouth every 8 (eight) hours as needed for  muscle spasms., Disp: 90 tablet, Rfl: 5 .  metoprolol tartrate (LOPRESSOR) 25 MG tablet, TAKE 1 TABLET BY MOUTH TWICE A DAY, Disp: 60 tablet, Rfl: 11 .  nitroGLYCERIN (NITROSTAT) 0.4 MG SL tablet, Place 1 tablet (0.4 mg total) under the tongue every 5 (five) minutes as needed for chest pain., Disp: 30 tablet, Rfl: 3 .  pregabalin (LYRICA) 25 MG capsule, Take 1 capsule (25 mg total) by mouth 3 (three) times daily., Disp: 90 capsule, Rfl: 2 .  PROVENTIL HFA 108 (90 Base) MCG/ACT inhaler, INHALE 1-2 PUFFS EVERY 6 (SIX) HOURS AS NEEDED INTO THE LUNGS FOR WHEEZING OR SHORTNESS OF BREATH., Disp: 6.7 Inhaler, Rfl: 5 .  rosuvastatin (CRESTOR) 40 MG tablet, TAKE 1 TABLET BY MOUTH EVERY DAY (Patient taking differently: Take 40 mg by mouth daily. ), Disp: 90 tablet, Rfl: 3 .  Secukinumab (COSENTYX, 300 MG DOSE, [place]), Inject 300 mg into the skin every 30 (thirty) days. , Disp: , Rfl:  .  sertraline (ZOLOFT) 100 MG tablet, Take 1 tablet (100 mg total) by mouth daily., Disp: 90 tablet, Rfl: 3 .  tiotropium (SPIRIVA) 18 MCG inhalation capsule, Place 1 capsule (18 mcg total) daily into inhaler and inhale., Disp: 30 capsule, Rfl: 5 .  triamcinolone ointment (KENALOG) 0.5 %, Apply 1 application 2 (two) times daily topically. On psoriasis lesions, Disp: 30 g, Rfl: 2 .  varenicline (CHANTIX STARTING MONTH PAK) 0.5 MG X 11 & 1 MG X 42 tablet, Take one 0.5 mg tablet by mouth once daily for 3 days, then increase to one 0.5 mg tablet twice daily for 4 days, then increase to one 1 mg tablet twice daily., Disp: 53 tablet, Rfl: 0  ROS  Constitutional: Denies any fever or chills Gastrointestinal: No reported hemesis, hematochezia, vomiting, or acute GI distress Musculoskeletal: Denies any acute onset joint swelling, redness, loss of ROM, or weakness Neurological: No reported episodes of acute onset apraxia, aphasia, dysarthria, agnosia, amnesia, paralysis, loss of coordination, or loss of consciousness  Allergies  Caleb [name]  is allergic to lisinopril.  [place]  Drug: Caleb [name]  reports no history of drug use. Alcohol:  reports current alcohol use of about 4.0 standard drinks of alcohol per week. Tobacco:  reports that he has been smoking cigarettes. He has a 24.50 pack-year smoking history. He has never used smokeless tobacco. Medical:  has a past medical history of CHF (congestive heart failure) ([place]), Chronic foot pain, right (2015), COPD (chronic obstructive pulmonary disease) ([place]), Coronary artery disease, Hyperlipidemia, Hypertension, Leucocytosis, Myocardial infarction ([place]) (2015), and OSA on CPAP. Surgical: Caleb [name]  has a past surgical history that includes Cardiac catheterization (2015); Lumbar laminectomy (1989, 1999); Cervical fusion (1988, 1998); Liver surgery (2015); Partial colectomy (1990); Inguinal hernia repair (Bilateral, 1975); Lithotripsy; and Colonoscopy with propofol (N/A, 02/24/2017). Family: family history includes Alzheimer's disease in his paternal grandmother; Alzheimer's disease (age of onset: 76) in his father; Breast cancer in his maternal uncle; CAD in his mother; Diabetes in his maternal grandmother; Healthy in his sister; Heart attack in his maternal uncle; Heart failure in his maternal grandmother; Heart failure (age of onset: 41) in his mother; Non-Hodgkin's lymphoma in his sister.  Constitutional Exam  General appearance: Well nourished, well developed, and well hydrated. In no apparent acute distress Vitals:   03/13/18 0857  BP: 118/73  Pulse: 63  Resp: 18  Temp: 97.7 F (36.5 C)  TempSrc: Oral  SpO2: 99%  Weight: 191 lb (86.6 kg)  Height: '5\' 8"'$  (1.727 m)   BMI Assessment: Estimated body mass index is 29.04 kg/m as calculated from the following:   Height as of this encounter: '5\' 8"'$  (1.727 m).   Weight as of this encounter: 191 lb (86.6 kg).  BMI interpretation table: BMI level Category Range association with higher incidence of chronic pain  <18 kg/m2 Underweight    18.5-24.9 kg/m2 Ideal body weight   25-29.9 kg/m2 Overweight Increased incidence by 20%  30-34.9 kg/m2 Obese ([name] I) Increased incidence by 68%  35-39.9 kg/m2 Severe obesity ([name] II) Increased incidence by 136%  >40 kg/m2 Extreme obesity ([name] III) Increased incidence by 254%   Patient's current BMI Ideal Body weight  Body mass index is 29.04 kg/m. Ideal body weight: 68.4 kg (150 lb 12.7 oz) Adjusted ideal body weight: 75.7 kg (166 lb 14 oz)   BMI Readings from Last  4 Encounters:  03/13/18 29.04 kg/m  03/08/18 29.16 kg/m  02/21/18 29.95 kg/m  02/14/18 29.80 kg/m   Wt Readings from Last 4 Encounters:  03/13/18 191 lb (86.6 kg)  03/08/18 191 lb 12.8 oz (87 kg)  02/21/18 197 lb (89.4 kg)  02/14/18 196 lb (88.9 kg)  Psych/Mental status: Alert, oriented x 3 (person, place, & time)       Eyes: PERLA Respiratory: No evidence of acute respiratory distress  Cervical Spine Area Exam  Skin & Axial Inspection: No masses, redness, edema, swelling, or associated skin lesions Alignment: Symmetrical Functional ROM: Improved after treatment      Stability: No instability detected Muscle Tone/Strength: Functionally intact. No obvious neuro-muscular anomalies detected. Sensory (Neurological): Improved Palpation: No palpable anomalies              Upper Extremity (UE) Exam    Side: Right upper extremity  Side: Left upper extremity  Skin & Extremity Inspection: Skin color, temperature, and hair growth are WNL. No peripheral edema or cyanosis. No masses, redness, swelling, asymmetry, or associated skin lesions. No contractures.  Skin & Extremity Inspection: Skin color, temperature, and hair growth are WNL. No peripheral edema or cyanosis. No masses, redness, swelling, asymmetry, or associated skin lesions. No contractures.  Functional ROM: Unrestricted ROM          Functional ROM: Unrestricted ROM          Muscle Tone/Strength: Functionally intact. No obvious neuro-muscular anomalies  detected.  Muscle Tone/Strength: Functionally intact. No obvious neuro-muscular anomalies detected.  Sensory (Neurological): Unimpaired          Sensory (Neurological): Unimpaired          Palpation: No palpable anomalies              Palpation: No palpable anomalies              Provocative Test(s):  Phalen's test: deferred Tinel's test: deferred Apley's scratch test (touch opposite shoulder):  Action 1 (Across chest): deferred Action 2 (Overhead): deferred Action 3 (LB reach): deferred   Provocative Test(s):  Phalen's test: deferred Tinel's test: deferred Apley's scratch test (touch opposite shoulder):  Action 1 (Across chest): deferred Action 2 (Overhead): deferred Action 3 (LB reach): deferred    Thoracic Spine Area Exam  Skin & Axial Inspection: No masses, redness, or swelling Alignment: Symmetrical Functional ROM: Unrestricted ROM Stability: No instability detected Muscle Tone/Strength: Functionally intact. No obvious neuro-muscular anomalies detected. Sensory (Neurological): Unimpaired Muscle strength & Tone: No palpable anomalies  Lumbar Spine Area Exam  Skin & Axial Inspection: No masses, redness, or swelling Alignment: Symmetrical Functional ROM: Unrestricted ROM       Stability: No instability detected Muscle Tone/Strength: Functionally intact. No obvious neuro-muscular anomalies detected. Sensory (Neurological): Unimpaired Palpation: No palpable anomalies       Provocative Tests: Hyperextension/rotation test: deferred today       Lumbar quadrant test (Kemp's test): deferred today       Lateral bending test: deferred today       Patrick's Maneuver: deferred today                   FABER* test: deferred today                   S-I anterior distraction/compression test: deferred today         S-I lateral compression test: deferred today         S-I Thigh-thrust test: deferred today  S-I Gaenslen's test: deferred today         *(Flexion, ABduction and  External Rotation)  Gait & Posture Assessment  Ambulation: Unassisted Gait: Relatively normal for age and body habitus Posture: WNL   Lower Extremity Exam    Side: Right lower extremity  Side: Left lower extremity  Stability: No instability observed          Stability: No instability observed          Skin & Extremity Inspection: Skin color, temperature, and hair growth are WNL. No peripheral edema or cyanosis. No masses, redness, swelling, asymmetry, or associated skin lesions. No contractures.  Skin & Extremity Inspection: Skin color, temperature, and hair growth are WNL. No peripheral edema or cyanosis. No masses, redness, swelling, asymmetry, or associated skin lesions. No contractures.  Functional ROM: Unrestricted ROM                  Functional ROM: Unrestricted ROM                  Muscle Tone/Strength: Functionally intact. No obvious neuro-muscular anomalies detected.  Muscle Tone/Strength: Functionally intact. No obvious neuro-muscular anomalies detected.  Sensory (Neurological): Unimpaired        Sensory (Neurological): Unimpaired        DTR: Patellar: deferred today Achilles: deferred today Plantar: deferred today  DTR: Patellar: deferred today Achilles: deferred today Plantar: deferred today  Palpation: No palpable anomalies  Palpation: No palpable anomalies   Assessment   Status Diagnosis  Improved Controlled Stable 1. Cervicalgia (Bilateral) (L>R)   2. Cervical spondylosis w/ radiculopathy   3. 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 Chronic upper extremity pain (Left)   5. Chronic upper extremity weakness (Left)   6. 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  Plan of Care  Pharmacotherapy (Medications Ordered): No orders of the defined types were placed in this encounter.  Medications administered today: Caleb [name] had no medications administered during this  visit.  Orders:  Orders Placed This Encounter  Procedures  . Cervical Epidural Injection    Procedure: Cervical Epidural Steroid Injection/Block Purpose: Palliative Indication(s): Radiculitis and/or cervicalgia associater with cervical degenerative disc disease.    Standing Status:   Standing    Number of Occurrences:   6    Standing Expiration Date:   09/13/2019    Scheduling Instructions:     Level(s): C7-T1     Laterality: Left-sided     Sedation: With Sedation.     Timeframe: PRN    Order Specific Question:   Where will this procedure be performed?    Answer:   [facility]    Comments:   by Dr. Dossie Arbour   Lab Orders  No laboratory test(s) ordered today   Imaging Orders  No imaging studies ordered today   Referral Orders  No referral(s) requested today   Planned follow-up:   Return for PRN Procedure(s): (L) CESI #5 (Decadron 10 mg + Kenalog 40 mg).  The patient was referred back to Dr. Cari Caraway for possible cervical spine decompression.  Dr. Cari Caraway indicated that he first needs to stop smoking before he can have surgery.  He is currently trying to do that.   Interventional management options: Considering:   Palliative/diagnostic right-sidedCommon Peroneal nerve block#3 Palliative right-sided common peroneal nerve RFA #2(last done on 06/21/2017) Diagnostic right ankle block Diagnosticright lumbar sympathetic block Diagnostic midlinethoracic epidural steroid injection Diagnostic bilateral  thoracic facet block Possible bilateral thoracic facet RFA Diagnostic cervical epidural steroid injection Diagnostic bilateral cervical facet block#3 Possible bilateral cervical facet RFA   Palliative PRN treatment(s):   Palliative left sided cervical epidural steroid injection #4 Palliative right-sided common peroneal nerve blocks Palliative right-sided common peroneal nerve RFA #2(last done on 06/21/2017)   Future Appointments  Date Time Provider  [place]  03/15/2018 10:30 AM Rise Mu, PA-C [id number]  04/04/2018  1:45 PM Billey Co, MD [facility] None  04/06/2018  8:15 AM Vevelyn Francois, NP [facility] None  06/15/2018  9:40 AM Bacigalupo, Dionne Bucy, MD [facility] None   Primary Care Physician: Virginia Crews, MD Location: [facility] Note by: Gaspar Cola, MD Date: 03/13/2018; Time: 9:45 AM

## 2018-03-13 ENCOUNTER — Ambulatory Visit: Payer: Medicaid Other | Attending: Pain Medicine | Admitting: Pain Medicine

## 2018-03-13 ENCOUNTER — Encounter: Payer: Self-pay | Admitting: Pain Medicine

## 2018-03-13 ENCOUNTER — Other Ambulatory Visit: Payer: Self-pay

## 2018-03-13 VITALS — BP 118/73 | HR 63 | Temp 97.7°F | Resp 18 | Ht 68.0 in | Wt 191.0 lb

## 2018-03-13 DIAGNOSIS — M5412 Radiculopathy, cervical region: Secondary | ICD-10-CM

## 2018-03-13 DIAGNOSIS — M4722 Other spondylosis with radiculopathy, cervical region: Secondary | ICD-10-CM | POA: Diagnosis not present

## 2018-03-13 DIAGNOSIS — R29898 Other symptoms and signs involving the musculoskeletal system: Secondary | ICD-10-CM

## 2018-03-13 DIAGNOSIS — G8929 Other chronic pain: Secondary | ICD-10-CM | POA: Diagnosis not present

## 2018-03-13 DIAGNOSIS — M542 Cervicalgia: Secondary | ICD-10-CM

## 2018-03-13 DIAGNOSIS — M4802 Spinal stenosis, cervical region: Secondary | ICD-10-CM | POA: Diagnosis not present

## 2018-03-13 DIAGNOSIS — M79602 Pain in left arm: Secondary | ICD-10-CM

## 2018-03-13 NOTE — Patient Instructions (Signed)
____________________________________________________________________________________________  Preparing for Procedure with Sedation  Instructions: . Oral Intake: Do not eat or drink anything for at least 8 hours prior to your procedure. . Transportation: Public transportation is not allowed. Bring an adult driver. The driver must be physically present in our waiting room before any procedure can be started. . Physical Assistance: Bring an adult physically capable of assisting you, in the event you need help. This adult should keep you company at home for at least 6 hours after the procedure. . Blood Pressure Medicine: Take your blood pressure medicine with a sip of water the morning of the procedure. . Blood thinners: Notify our staff if you are taking any blood thinners. Depending on which one you take, there will be specific instructions on how and when to stop it. . Diabetics on insulin: Notify the staff so that you can be scheduled 1st case in the morning. If your diabetes requires high dose insulin, take only  of your normal insulin dose the morning of the procedure and notify the staff that you have done so. . Preventing infections: Shower with an antibacterial soap the morning of your procedure. . Build-up your immune system: Take 1000 mg of Vitamin C with every meal (3 times a day) the day prior to your procedure. . Antibiotics: Inform the staff if you have a condition or reason that requires you to take antibiotics before dental procedures. . Pregnancy: If you are pregnant, call and cancel the procedure. . Sickness: If you have a cold, fever, or any active infections, call and cancel the procedure. . Arrival: You must be in the facility at least 30 minutes prior to your scheduled procedure. . Children: Do not bring children with you. . Dress appropriately: Bring dark clothing that you would not mind if they get stained. . Valuables: Do not bring any jewelry or valuables.  Procedure  appointments are reserved for interventional treatments only. . No Prescription Refills. . No medication changes will be discussed during procedure appointments. . No disability issues will be discussed.  Reasons to call and reschedule or cancel your procedure: (Following these recommendations will minimize the risk of a serious complication.) . Surgeries: Avoid having procedures within 2 weeks of any surgery. (Avoid for 2 weeks before or after any surgery). . Flu Shots: Avoid having procedures within 2 weeks of a flu shots or . (Avoid for 2 weeks before or after immunizations). . Barium: Avoid having a procedure within 7-10 days after having had a radiological study involving the use of radiological contrast. (Myelograms, Barium swallow or enema study). . Heart attacks: Avoid any elective procedures or surgeries for the initial 6 months after a "Myocardial Infarction" (Heart Attack). . Blood thinners: It is imperative that you stop these medications before procedures. Let us know if you if you take any blood thinner.  . Infection: Avoid procedures during or within two weeks of an infection (including chest colds or gastrointestinal problems). Symptoms associated with infections include: Localized redness, fever, chills, night sweats or profuse sweating, burning sensation when voiding, cough, congestion, stuffiness, runny nose, sore throat, diarrhea, nausea, vomiting, cold or Flu symptoms, recent or current infections. It is specially important if the infection is over the area that we intend to treat. . Heart and lung problems: Symptoms that may suggest an active cardiopulmonary problem include: cough, chest pain, breathing difficulties or shortness of breath, dizziness, ankle swelling, uncontrolled high or unusually low blood pressure, and/or palpitations. If you are experiencing any of these symptoms, cancel   your procedure and contact your primary care physician for an evaluation.  Remember:   Regular Business hours are:  Monday to Thursday 8:00 AM to 4:00 PM  Provider's Schedule: Kyrie Fludd, MD:  Procedure days: Tuesday and Thursday 7:30 AM to 4:00 PM  Bilal Lateef, MD:  Procedure days: Monday and Wednesday 7:30 AM to 4:00 PM ____________________________________________________________________________________________    

## 2018-03-13 NOTE — Progress Notes (Signed)
Safety precautions to be maintained throughout the outpatient stay will include: orient to surroundings, keep bed in low position, maintain call bell within reach at all times, provide assistance with transfer out of bed and ambulation.  

## 2018-03-14 ENCOUNTER — Ambulatory Visit: Payer: Medicaid Other | Admitting: Cardiovascular Disease

## 2018-03-14 ENCOUNTER — Encounter: Payer: Self-pay | Admitting: Physician Assistant
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
## 2018-03-15 ENCOUNTER — Encounter: Payer: Self-pay | Admitting: Physician Assistant

## 2018-03-15 ENCOUNTER — Ambulatory Visit (INDEPENDENT_AMBULATORY_CARE_PROVIDER_SITE_OTHER): Payer: Medicaid Other

## 2018-03-15 ENCOUNTER — Other Ambulatory Visit: Payer: Self-pay

## 2018-03-15 ENCOUNTER — Ambulatory Visit: Payer: Medicaid Other | Admitting: Physician Assistant

## 2018-03-15 VITALS — BP 108/72 | HR 63 | Ht 68.0 in | Wt 193.2 lb

## 2018-03-15 DIAGNOSIS — I951 Orthostatic hypotension: Secondary | ICD-10-CM

## 2018-03-15 DIAGNOSIS — R55 Syncope and collapse: Secondary | ICD-10-CM

## 2018-03-15 DIAGNOSIS — I10 Essential (primary) hypertension: Secondary | ICD-10-CM | POA: Diagnosis not present

## 2018-03-15 DIAGNOSIS — I2510 Atherosclerotic heart disease of native coronary artery without angina pectoris: Secondary | ICD-10-CM

## 2018-03-15 DIAGNOSIS — E785 Hyperlipidemia, unspecified: Secondary | ICD-10-CM

## 2018-03-15 DIAGNOSIS — Z720 Tobacco use: Secondary | ICD-10-CM

## 2018-03-15 NOTE — Patient Instructions (Signed)
Medication Instructions:  Your physician recommends that you continue on your current medications as directed. Please refer to the Current Medication list given to you today.  If you need a refill on your cardiac medications before your next appointment, please call your pharmacy.   Lab work: Your physician recommends that you have lab work today(BMET)   If you have labs (blood work) drawn today and your tests are completely normal, you will receive your results only by: Marland Kitchen MyChart Message (if you have MyChart) OR . A paper copy in the mail If you have any lab test that is abnormal or we need to change your treatment, we will call you to review the results.  Testing/Procedures: 1- Zio monitor 4 weeks.  A) A zio monitor was placed today. It will remain on for 14 days. You will then return monitor and event diary in provided box. It takes 1-2 weeks for report to be downloaded and returned to [place]. We will call you with the results. If monitor falls of or has orange flashing light, please call [employer] for further instructions.   B) Zio AT: We will place order and you will receive it in the mail.  You may get a call from [employer] @ either  [phone] Or  [phone] for them to confirm your address before it will be sent to you.  You will wear the monitor for 14 days, remove it and send it back to the company. They will send [place] a report. Then we will call you with the results.  2- Echo  Echo  Please return to [facility] on ______________ at _______________ AM/PM for an Echocardiogram. Your physician has requested that you have an echocardiogram. Echocardiography is a painless test that uses sound waves to create images of your heart. It provides your doctor with information about the size and shape of your heart and how well your heart's chambers and valves are working. This procedure takes approximately one hour. There are no restrictions for this procedure. Please note;  depending on visual quality an IV may need to be placed.   3- Carotid ultrasound Your physician has requested that you have a carotid duplex. This test is an ultrasound of the carotid arteries in your neck. It looks at blood flow through these arteries that supply the brain with blood. Allow one hour for this exam. There are no restrictions or special instructions.  [place]  Your caregiver has ordered a Stress Test with nuclear imaging. The purpose of this test is to evaluate the blood supply to your heart muscle. This procedure is referred to as a "Non-Invasive Stress Test." This is because other than having an IV started in your vein, nothing is inserted or "invades" your body. Cardiac stress tests are done to find areas of poor blood flow to the heart by determining the extent of coronary artery disease (CAD). Some patients exercise on a treadmill, which naturally increases the blood flow to your heart, while others who are  unable to walk on a treadmill due to physical limitations have a pharmacologic/chemical stress agent called Lexiscan . This medicine will mimic walking on a treadmill by temporarily increasing your coronary blood flow.   Please note: these test may take anywhere between 2-4 hours to complete  PLEASE REPORT TO [place] AT THE FIRST DESK WILL DIRECT YOU WHERE TO GO  Date of Procedure:_____________________________________  Arrival Time for Procedure:______________________________  Instructions regarding medication:  __x__ : Hold diuretic (HCTZ)  medication morning of procedure  __x__:  Hold betablocker(s) (metoprolol) night before procedure and morning of procedure  _____________________________________________________________  PLEASE NOTIFY THE OFFICE AT LEAST 24 HOURS IN ADVANCE IF YOU ARE UNABLE TO KEEP YOUR APPOINTMENT.  [phone] AND  PLEASE NOTIFY NUCLEAR MEDICINE AT [facility] AT LEAST 24 HOURS IN ADVANCE IF YOU  ARE UNABLE TO KEEP YOUR APPOINTMENT. [phone]  How to prepare for your Myoview test:  1. Do not eat or drink after midnight 2. No caffeine for 24 hours prior to test 3. No smoking 24 hours prior to test. 4. Your medication may be taken with water.  If your doctor stopped a medication because of this test, do not take that medication. 5. Ladies, please do not wear dresses.  Skirts or pants are appropriate. Please wear a short sleeve shirt. 6. No perfume, cologne or lotion. 7. Wear comfortable walking shoes. No heels!   Follow-Up: At [facility], you and your health needs are our priority.  As part of our continuing mission to provide you with exceptional heart care, we have created designated Provider Care Teams.  These Care Teams include your primary Cardiologist (physician) and Advanced Practice Providers (APPs -  Physician Assistants and Nurse Practitioners) who all work together to provide you with the care you need, when you need it. You will need a follow up appointment in 4-6 weeks. You may see Dr. Rockey Situ or Christell Faith, PA-C  Any Other Special Instructions Will Be Listed Below (If Applicable). No driving for 6 months until cleared by your cardiologist.

## 2018-03-16 ENCOUNTER — Telehealth: Payer: Self-pay | Admitting: Cardiovascular Disease

## 2018-03-16 LAB — BASIC METABOLIC PANEL
BUN/Creatinine Ratio: 23 (ref 10–24)
BUN: 17 mg/dL (ref 8–27)
CO2: 24 mmol/L (ref 20–29)
Calcium: 9.4 mg/dL (ref 8.6–10.2)
Chloride: 98 mmol/L (ref 96–106)
Creatinine, Ser: 0.75 mg/dL — ABNORMAL LOW (ref 0.76–1.27)
GFR calc Af Amer: 115 mL/min/{1.73_m2} (ref >59)
GFR calc non Af Amer: 100 mL/min/{1.73_m2} (ref >59)
Glucose: 126 mg/dL — ABNORMAL HIGH (ref 65–99)
Potassium: 3.8 mmol/L (ref 3.5–5.2)
Sodium: 139 mmol/L (ref 134–144)

## 2018-03-16 NOTE — Telephone Encounter (Signed)
Returned call to [employer] no company name provided. [employer] info left on the voicemail for the tel number provided.  Not sure where Caleb [name] is calling from. Left a message for her to return the call.

## 2018-03-16 NOTE — Telephone Encounter (Signed)
Please call to verify if pt will be wearing 2 devices 28 day wear

## 2018-03-21 ENCOUNTER — Ambulatory Visit
Admission: RE | Admit: 2018-03-21 | Discharge: 2018-03-21 | Disposition: A | Discharge status: Home / Self Care | Payer: Medicaid Other | Source: Ambulatory Visit | Attending: Physician Assistant | Admitting: Physician Assistant

## 2018-03-21 ENCOUNTER — Other Ambulatory Visit: Payer: Self-pay

## 2018-03-21 DIAGNOSIS — R55 Syncope and collapse: Secondary | ICD-10-CM | POA: Diagnosis not present

## 2018-03-21 LAB — NM MYOCAR MULTI W/SPECT W/WALL MOTION / EF
LV dias vol: 74 mL (ref 62–150)
LV sys vol: 26 mL
Peak HR: 77 {beats}/min
Percent HR: 48 %
Rest HR: 56 {beats}/min
TID: 1.08

## 2018-03-21 MED ORDER — TECHNETIUM TC 99M TETROFOSMIN IV KIT
10.0000 | PACK | Freq: Once | INTRAVENOUS | Status: AC | PRN
  Administered 2018-03-21: 10.38 via INTRAVENOUS

## 2018-03-21 MED ORDER — TECHNETIUM TC 99M TETROFOSMIN IV KIT
30.8200 | PACK | Freq: Once | INTRAVENOUS | Status: AC | PRN
  Administered 2018-03-21: 30.82 via INTRAVENOUS

## 2018-03-21 MED ORDER — REGADENOSON 0.4 MG/5ML IV SOLN
0.4000 mg | Freq: Once | INTRAVENOUS | Status: AC
  Administered 2018-03-21: 0.4 mg via INTRAVENOUS

## 2018-03-22 NOTE — Telephone Encounter (Signed)
Hey [name],  Is he wearing 2 ZIO AT's?

## 2018-03-23 NOTE — Telephone Encounter (Signed)
Pt was scheduled to wear 28 day zio AT. One was placed in clinic and one to be mailed to company as stated in last OV AVS.   I returned call to [employer] to confirm that 28 day was requested, she reported that 2nd monitor had already been received by patient.   Nothing further needed at this time.

## 2018-03-28 NOTE — Telephone Encounter (Signed)
[employer] calling States they are having a difficult time getting in touch with patient  Confirmed phone number and will continue to attempt contact Please make patient aware if in touch

## 2018-03-29 ENCOUNTER — Ambulatory Visit (INDEPENDENT_AMBULATORY_CARE_PROVIDER_SITE_OTHER): Payer: Medicaid Other

## 2018-03-29 DIAGNOSIS — R55 Syncope and collapse: Secondary | ICD-10-CM | POA: Diagnosis not present

## 2018-03-29 NOTE — Telephone Encounter (Signed)
Spoke with patient and reviewed that company was trying to reach him to review monitor information. Confirmed that he may receive a call from them today. He verbalized understanding with no further questions at this time.

## 2018-03-29 NOTE — Telephone Encounter (Signed)
Spoke with Gershon Mussel with [employer] and confirmed contact information for patient and that he would be expecting their call back today. He verbalized understanding with no further questions at this time and will reach out to patient to try and confirm all information.

## 2018-04-04 ENCOUNTER — Telehealth: Payer: Self-pay

## 2018-04-04 ENCOUNTER — Ambulatory Visit: Payer: Medicaid Other | Admitting: Urology

## 2018-04-04 ENCOUNTER — Telehealth (INDEPENDENT_AMBULATORY_CARE_PROVIDER_SITE_OTHER): Payer: Medicaid Other | Admitting: Internal Medicine

## 2018-04-04 VITALS — BP 130/83 | HR 66 | Ht 68.0 in | Wt 191.0 lb

## 2018-04-04 DIAGNOSIS — R55 Syncope and collapse: Secondary | ICD-10-CM

## 2018-04-04 DIAGNOSIS — J449 Chronic obstructive pulmonary disease, unspecified: Secondary | ICD-10-CM
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
## 2018-04-04 NOTE — Patient Instructions (Addendum)
Medication Instructions:  - no changes  If you need a refill on your cardiac medications before your next appointment, please call your pharmacy.   Lab work: -none ordered  If you have labs (blood work) drawn today and your tests are completely normal, you will receive your results only by: Marland Kitchen MyChart Message (if you have MyChart) OR . A paper copy in the mail If you have any lab test that is abnormal or we need to change your treatment, we will call you to review the results.  Testing/Procedures: - none ordered  Follow-Up: At [facility], you and your health needs are our priority.  As part of our continuing mission to provide you with exceptional heart care, we have created designated Provider Care Teams.  These Care Teams include your primary Cardiologist (physician) and Advanced Practice Providers (APPs -  Physician Assistants and Nurse Practitioners) who all work together to provide you with the care you need, when you need it. . in 2 months with Dr. Caryl Comes  Any Other Special Instructions Will Be Listed Below (If Applicable). - N/A

## 2018-04-05 ENCOUNTER — Other Ambulatory Visit: Payer: Self-pay

## 2018-04-05 NOTE — Progress Notes (Unsigned)
[id number]

## 2018-04-06 ENCOUNTER — Telehealth: Payer: Self-pay | Admitting: *Deleted

## 2018-04-06 ENCOUNTER — Ambulatory Visit: Payer: Medicaid Other | Attending: Nurse Practitioner | Admitting: Nurse Practitioner

## 2018-04-06 ENCOUNTER — Other Ambulatory Visit: Payer: Self-pay

## 2018-04-06 ENCOUNTER — Encounter: Payer: Medicaid Other | Admitting: Nurse Practitioner

## 2018-04-06 DIAGNOSIS — G8918 Other acute postprocedural pain: Secondary | ICD-10-CM | POA: Diagnosis not present

## 2018-04-06 DIAGNOSIS — G8929 Other chronic pain: Secondary | ICD-10-CM | POA: Diagnosis not present

## 2018-04-06 DIAGNOSIS — M7918 Myalgia, other site: Secondary | ICD-10-CM

## 2018-04-06 DIAGNOSIS — M79671 Pain in right foot: Secondary | ICD-10-CM

## 2018-04-06 DIAGNOSIS — M4722 Other spondylosis with radiculopathy, cervical region: Secondary | ICD-10-CM | POA: Diagnosis not present

## 2018-04-06 DIAGNOSIS — R55 Syncope and collapse: Secondary | ICD-10-CM

## 2018-04-06 MED ORDER — HYDROCODONE-ACETAMINOPHEN 5-325 MG PO TABS: 1.0000 | ORAL_TABLET | Freq: Four times a day (QID) | ORAL | 0 refills | Status: DC | PRN

## 2018-04-06 MED ORDER — PREGABALIN 25 MG PO CAPS: 25.0000 mg | ORAL_CAPSULE | Freq: Three times a day (TID) | ORAL | 2 refills | Status: DC

## 2018-04-06 NOTE — Patient Instructions (Signed)
____________________________________________________________________________________________  Medication Rules  Purpose: To inform patients, and their family members, of our rules and regulations.  Applies to: All patients receiving prescriptions (written or electronic).  Pharmacy of record: Pharmacy where electronic prescriptions will be sent. If written prescriptions are taken to a different pharmacy, please inform the nursing staff. The pharmacy listed in the electronic medical record should be the one where you would like electronic prescriptions to be sent.  Electronic prescriptions: In compliance with the [place] Strengthen Opioid Misuse Prevention (STOP) Act of 2017 (Session Law 2017-74/H243), effective January 04, 2018, all controlled substances must be electronically prescribed. Calling prescriptions to the pharmacy will cease to exist.  Prescription refills: Only during scheduled appointments. Applies to all prescriptions.  NOTE: The following applies primarily to controlled substances (Opioid* Pain Medications).   Patient's responsibilities: 1. Pain Pills: Bring all pain pills to every appointment (except for procedure appointments). 2. Pill Bottles: Bring pills in original pharmacy bottle. Always bring the newest bottle. Bring bottle, even if empty. 3. Medication refills: You are responsible for knowing and keeping track of what medications you take and those you need refilled. The day before your appointment: write a list of all prescriptions that need to be refilled. The day of the appointment: give the list to the admitting nurse. Prescriptions will be written only during appointments. No prescriptions will be written on procedure days. If you forget a medication: it will not be "Called in", "Faxed", or "electronically sent". You will need to get another appointment to get these prescribed. No early refills. Do not call asking to have your prescription filled  early. 4. Prescription Accuracy: You are responsible for carefully inspecting your prescriptions before leaving our office. Have the discharge nurse carefully go over each prescription with you, before taking them home. Make sure that your name is accurately spelled, that your address is correct. Check the name and dose of your medication to make sure it is accurate. Check the number of pills, and the written instructions to make sure they are clear and accurate. Make sure that you are given enough medication to last until your next medication refill appointment. 5. Taking Medication: Take medication as prescribed. When it comes to controlled substances, taking less pills or less frequently than prescribed is permitted and encouraged. Never take more pills than instructed. Never take medication more frequently than prescribed.  6. Inform other Doctors: Always inform, all of your healthcare providers, of all the medications you take. 7. Pain Medication from other Providers: You are not allowed to accept any additional pain medication from any other Doctor or Healthcare provider. There are two exceptions to this rule. (see below) In the event that you require additional pain medication, you are responsible for notifying us, as stated below. 8. Medication Agreement: You are responsible for carefully reading and following our Medication Agreement. This must be signed before receiving any prescriptions from our practice. Safely store a copy of your signed Agreement. Violations to the Agreement will result in no further prescriptions. (Additional copies of our Medication Agreement are available upon request.) 9. Laws, Rules, & Regulations: All patients are expected to follow all Federal and State Laws, Statutes, Rules, & Regulations. Ignorance of the Laws does not constitute a valid excuse. The use of any illegal substances is prohibited. 10. Adopted CDC guidelines & recommendations: Target dosing levels will be  at or below 60 MME/day. Use of benzodiazepines** is not recommended.  Exceptions: There are only two exceptions to the rule of not   receiving pain medications from other Healthcare Providers. 1. Exception #1 (Emergencies): In the event of an emergency (i.e.: accident requiring emergency care), you are allowed to receive additional pain medication. However, you are responsible for: As soon as you are able, call our office [phone], at any time of the day or night, and leave a message stating your name, the date and nature of the emergency, and the name and dose of the medication prescribed. In the event that your call is answered by a member of our staff, make sure to document and save the date, time, and the name of the person that took your information.  2. Exception #2 (Planned Surgery): In the event that you are scheduled by another doctor or dentist to have any type of surgery or procedure, you are allowed (for a period no longer than 30 days), to receive additional pain medication, for the acute post-op pain. However, in this case, you are responsible for picking up a copy of our "Post-op Pain Management for Surgeons" handout, and giving it to your surgeon or dentist. This document is available at our office, and does not require an appointment to obtain it. Simply go to our office during business hours (Monday-Thursday from 8:00 AM to 4:00 PM) (Friday 8:00 AM to 12:00 Noon) or if you have a scheduled appointment with us, prior to your surgery, and ask for it by name. In addition, you will need to provide us with your name, name of your surgeon, type of surgery, and date of procedure or surgery.  *Opioid medications include: morphine, codeine, oxycodone, oxymorphone, hydrocodone, hydromorphone, meperidine, tramadol, tapentadol, buprenorphine, fentanyl, methadone. **Benzodiazepine medications include: diazepam (Valium), alprazolam (Xanax), clonazepam (Klonopine), lorazepam (Ativan), clorazepate  (Tranxene), chlordiazepoxide (Librium), estazolam (Prosom), oxazepam (Serax), temazepam (Restoril), triazolam (Halcion) (Last updated: 03/03/2017) ____________________________________________________________________________________________    

## 2018-04-06 NOTE — Progress Notes (Signed)
[employer] visit scheduled for 04/07/18 @ 11 AM.

## 2018-04-06 NOTE — Telephone Encounter (Signed)
-----   Message from Rise Mu, PA-C sent at 04/06/2018 12:26 PM EDT ----- Heart monitor showed NSR with an average heart rate of 68 bpm.  2 runs of NSVT were noted with the fastest and longest episode being 7 beats with a maximum rate of 194 bpm.  2 SVT runs occurred with the fasting interval lasting 5 beats with a maximum rate of 200 bpm.  3 pauses occurred with the longest lasting 4 seconds, typically around 4-5 AM, raising the concern for sleep apnea.   Patient has already been evaluated by EP for the above findings. We will need to set up a referral to pulmonology for sleep study once COVID-19 restrictions are lessened.

## 2018-04-06 NOTE — Progress Notes (Signed)
Thank you for seeing him virtually and ensuring his syncope wasn't related to a cardiac issues.  We will work on COPD optimization. AB

## 2018-04-06 NOTE — Telephone Encounter (Signed)
Results called to pt. Pt verbalized understanding. He says he already has a sleep machine and called someone several months ago to have it refitted but never heard back.  He could not remember the name of the place. He has the number and will call them.  Meanwhile, he is agreeable to referral to pulmonology. Referral placed.
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
## 2018-04-07 ENCOUNTER — Ambulatory Visit (INDEPENDENT_AMBULATORY_CARE_PROVIDER_SITE_OTHER): Payer: Medicaid Other | Admitting: Family Medicine

## 2018-04-07 ENCOUNTER — Other Ambulatory Visit: Payer: Self-pay

## 2018-04-07 ENCOUNTER — Encounter: Payer: Self-pay | Admitting: Family Medicine

## 2018-04-07 DIAGNOSIS — G4733 Obstructive sleep apnea (adult) (pediatric): Secondary | ICD-10-CM

## 2018-04-07 DIAGNOSIS — R05 Cough: Secondary | ICD-10-CM | POA: Diagnosis not present

## 2018-04-07 DIAGNOSIS — R55 Syncope and collapse: Secondary | ICD-10-CM | POA: Insufficient documentation

## 2018-04-07 DIAGNOSIS — Z720 Tobacco use: Secondary | ICD-10-CM

## 2018-04-07 DIAGNOSIS — J449 Chronic obstructive pulmonary disease, unspecified: Secondary | ICD-10-CM

## 2018-04-07 DIAGNOSIS — R054 Cough syncope: Secondary | ICD-10-CM | POA: Insufficient documentation

## 2018-04-07 MED ORDER — ALBUTEROL SULFATE HFA 108 (90 BASE) MCG/ACT IN AERS: INHALATION_SPRAY | RESPIRATORY_TRACT | 5 refills | Status: DC

## 2018-04-07 MED ORDER — TIOTROPIUM BROMIDE MONOHYDRATE 18 MCG IN CAPS: 18.0000 ug | ORAL_CAPSULE | Freq: Every day | RESPIRATORY_TRACT | 5 refills | Status: DC

## 2018-04-07 MED ORDER — FLUTICASONE PROPIONATE HFA 220 MCG/ACT IN AERO: 1.0000 | INHALATION_SPRAY | Freq: Two times a day (BID) | RESPIRATORY_TRACT | 5 refills | Status: DC

## 2018-04-07 NOTE — Assessment & Plan Note (Signed)
Chronic and uncontrolled No signs of exacerbation currently Discussed importance of regular use of controller medications He admits that he has not been using any of his inhalers recently We will restart Spiriva and Flovent Can use albuterol as needed May need to consider escalation to Advair from [place] in the future This may be related to his coughing spells that are causing syncope as he is not currently taking his controller medications Upcoming appointment with pulmonology to discuss further
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
## 2018-04-07 NOTE — Assessment & Plan Note (Signed)
Chronic 3 to 5-minute discussion regarding the harmful effects of smoking, the benefits of cessation, and his chronic lung disease He has tried Chantix in the past, but states he did not have good success and he reached the insurance maximum of coverage for this We have discussed that we could try Wellbutrin in the future if he is ready to quit smoking He is currently pre-contemplative and states that the stress of dealing with his father with dementia makes it impossible for him to quit at this time because smoking is a stress reliever Discussed other possible stress relievers
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
## 2018-04-07 NOTE — Assessment & Plan Note (Signed)
Patient was previously doing well with his CPAP, but now he is having difficulty with mouth dryness while using machine I encouraged a humidifier in the room as well as the one attached to his machine He states I will be getting paperwork today to complete for changes that were made by the company recently His sinus node dysfunction and bradycardia overnight may be related to uncontrolled sleep apnea Encouraged him to also discuss his sleep apnea with his pulmonologist at that visit

## 2018-04-11 ENCOUNTER — Telehealth: Payer: Self-pay

## 2018-04-11 ENCOUNTER — Telehealth: Payer: Self-pay | Admitting: Family Medicine

## 2018-04-11 NOTE — Telephone Encounter (Signed)
Patient call the Call Center today (04/11/2018) @ 12:06 PM and spoke with Mayford Knife, RN. Patient stated the following: He has had decreased hearing in right ear for about 2 month. He had a video chat on Friday, 04/07/2018 with MD. Bonne Dolores Zyrtec and prescription nasal spray- Fluticasone, but hasn't helped. He woke up with orange/red wax discharge this am. He tried to flush it, but nothing coming out. Tired heating pad. No fever. He has some nasal congestion from pollen. No injury. Please advise.

## 2018-04-11 NOTE — Telephone Encounter (Signed)
[facility] states they are scheduling test at least 30 days, maybe longer due to COVID 19. Please let me know if patient needs modified bariam swallow before thenThanks

## 2018-04-12 ENCOUNTER — Other Ambulatory Visit: Payer: Medicaid Other

## 2018-04-12 NOTE — Telephone Encounter (Signed)
I want it schedule out after pandemic. No rush. Just don't want [place] to forget about it in the meantime, so thought we could go ahead and put it on the books.

## 2018-04-12 NOTE — Telephone Encounter (Signed)
LVMTRC 

## 2018-04-12 NOTE — Telephone Encounter (Signed)
Patient advised.

## 2018-04-12 NOTE — Telephone Encounter (Signed)
He likely has some impacted cerumen.  He should continue Zyrtec and Flonase. He did not mention this during virtual visit on 4/3.  He probably needs to come in and have someone look at his ear and possibly flush it in the office.

## 2018-04-13 ENCOUNTER — Encounter: Payer: Self-pay | Admitting: Pulmonary Disease

## 2018-04-13 ENCOUNTER — Ambulatory Visit (INDEPENDENT_AMBULATORY_CARE_PROVIDER_SITE_OTHER): Payer: Medicaid Other

## 2018-04-13 ENCOUNTER — Other Ambulatory Visit: Payer: Self-pay

## 2018-04-13 ENCOUNTER — Ambulatory Visit (INDEPENDENT_AMBULATORY_CARE_PROVIDER_SITE_OTHER): Payer: Medicaid Other | Admitting: Pulmonary Disease

## 2018-04-13 VITALS — BP 126/60 | HR 65 | Ht 68.0 in | Wt 194.2 lb

## 2018-04-13 DIAGNOSIS — F17200 Nicotine dependence, unspecified, uncomplicated: Secondary | ICD-10-CM | POA: Diagnosis not present

## 2018-04-13 DIAGNOSIS — J432 Centrilobular emphysema: Secondary | ICD-10-CM | POA: Diagnosis not present

## 2018-04-13 DIAGNOSIS — J42 Unspecified chronic bronchitis: Secondary | ICD-10-CM

## 2018-04-13 DIAGNOSIS — R053 Chronic cough: Secondary | ICD-10-CM

## 2018-04-13 DIAGNOSIS — R0609 Other forms of dyspnea: Secondary | ICD-10-CM

## 2018-04-13 DIAGNOSIS — R55 Syncope and collapse: Secondary | ICD-10-CM | POA: Diagnosis not present

## 2018-04-13 DIAGNOSIS — R0600 Dyspnea, unspecified: Secondary | ICD-10-CM

## 2018-04-13 DIAGNOSIS — G4733 Obstructive sleep apnea (adult) (pediatric): Secondary | ICD-10-CM

## 2018-04-13 DIAGNOSIS — R05 Cough: Secondary | ICD-10-CM

## 2018-04-13 DIAGNOSIS — R054 Cough syncope: Secondary | ICD-10-CM

## 2018-04-13 LAB — ECHOCARDIOGRAM COMPLETE
Height: 68 in
Weight: 3107.2 oz

## 2018-04-13 MED ORDER — NICOTINE 21 MG/24HR TD PT24: 21.0000 mg | MEDICATED_PATCH | TRANSDERMAL | 5 refills | Status: DC

## 2018-04-13 MED ORDER — UMECLIDINIUM-VILANTEROL 62.5-25 MCG/INH IN AEPB: 1.0000 | INHALATION_SPRAY | Freq: Every day | RESPIRATORY_TRACT | 0 refills | Status: AC

## 2018-04-13 MED ORDER — UMECLIDINIUM-VILANTEROL 62.5-25 MCG/INH IN AEPB: 1.0000 | INHALATION_SPRAY | Freq: Every day | RESPIRATORY_TRACT | 5 refills | Status: DC

## 2018-04-13 NOTE — Patient Instructions (Addendum)
We discussed smoking cessation in detail.  We have agreed to the following trial of nicotine replacement therapy: 1) nicotine patch, 21 mg.  Place in morning and remove at bedtime.  Prescription entered 2) you may also use 3 or 4 pieces of nicotine gum or lozenges (2 mg strength) daily 3) alternatively, you may try to find nicotine toothpicks and use these as desired throughout the day  For COPD: 1) discontinue Spiriva inhaler 2) begin Anoro inhaler, 1 inhalation daily.  Sample provided.  Prescription entered 3) discontinue Flovent inhaler for now 4) change albuterol inhaler to 2 sprays as needed up to every 4 hours for increased shortness of breath, cough, wheezing, chest tightness  For obstructive sleep apnea: As we discussed, try removing heat meant from CPAP circuit to improve mouth drying effect  For cough syncope: I think it is important that you complete evaluation of swallowing function as already ordered  Follow-up in 6-8 weeks with PFTs (lung function test) prior to that visit
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
## 2018-04-14 ENCOUNTER — Telehealth: Payer: Self-pay

## 2018-04-14 NOTE — Telephone Encounter (Signed)
Call to patient to review result note from provider on Echo and carotid u/s. Pt verbalized understanding.  He reported having consult with pulmonary who has a POC for him including breathing treatment and CPAP for sleep apnea. He is ordered for more testing.   Pt does not currently have f/u. Will consult provider for time frame.    Advised pt to call for any further questions or concerns.

## 2018-04-14 NOTE — Telephone Encounter (Signed)
-----   Message from Rise Mu, [place] sent at 04/14/2018 12:02 PM EDT ----- Echo showed low normal pump function with a mildly dilated left ventricle. There was slightly less pump function noted along a portion of the heart. The heart is slightly stiff. Mildly leaky mitral valve.   1) No obvious etiology of syncope on echo.  2) We can monitor his mildly leaky mitral valve can be monitored with periodic echo.  3) We may need to pursue cath post COVID-19 restrictions. 4) He has been referred to pulmonology already for need of sleep study. They can evaluate his cough as well.  5) Post COVID-19 restrictions, he may need GI evaluation for possible fistula.

## 2018-04-14 NOTE — Telephone Encounter (Signed)
Per review of EMR, patient disposition per Christell Faith, PA-C was to follow-up with Dr. Rockey Situ in 1 month (mid April 2020).  Are we able to set up a virtual appointment so he can touch base with Dr. Rockey Situ?

## 2018-04-17 NOTE — Telephone Encounter (Signed)
Scheduled

## 2018-04-18 ENCOUNTER — Other Ambulatory Visit: Payer: Self-pay

## 2018-04-19 ENCOUNTER — Ambulatory Visit: Payer: Medicaid Other | Admitting: Physician Assistant

## 2018-04-19 ENCOUNTER — Telehealth: Payer: Self-pay | Admitting: Cardiovascular Disease

## 2018-04-19 ENCOUNTER — Ambulatory Visit (INDEPENDENT_AMBULATORY_CARE_PROVIDER_SITE_OTHER): Payer: Medicaid Other | Admitting: Physician Assistant

## 2018-04-19 ENCOUNTER — Encounter: Payer: Self-pay | Admitting: Physician Assistant

## 2018-04-19 VITALS — BP 93/61 | HR 60 | Temp 97.7°F | Resp 16 | Wt 195.2 lb

## 2018-04-19 DIAGNOSIS — H60391 Other infective otitis externa, right ear: Secondary | ICD-10-CM | POA: Diagnosis not present

## 2018-04-19 DIAGNOSIS — H6121 Impacted cerumen, right ear: Secondary | ICD-10-CM | POA: Diagnosis not present

## 2018-04-19 MED ORDER — NEOMYCIN-POLYMYXIN-HC 3.5-10000-1 OT SUSP: 3.0000 [drp] | Freq: Four times a day (QID) | OTIC | 0 refills | Status: DC

## 2018-04-19 NOTE — Patient Instructions (Signed)
Otitis Externa    Otitis externa is an infection of the outer ear canal. The outer ear canal is the area between the outside of the ear and the eardrum. Otitis externa is sometimes called swimmer's ear.  What are the causes?  Common causes of this condition include:   Swimming in dirty water.   Moisture in the ear.   An injury to the inside of the ear.   An object stuck in the ear.   A cut or scrape on the outside of the ear.  What increases the risk?  You are more likely to develop this condition if you go swimming often.  What are the signs or symptoms?  The first symptom of this condition is often itching in the ear. Later symptoms of the condition include:   Swelling of the ear.   Redness in the ear.   Ear pain. The pain may get worse when you pull on your ear.   Pus coming from the ear.  How is this diagnosed?  This condition may be diagnosed by examining the ear and testing fluid from the ear for bacteria and funguses.  How is this treated?  This condition may be treated with:   Antibiotic ear drops. These are often given for 10-14 days.   Medicines to reduce itching and swelling.  Follow these instructions at home:   If you were prescribed antibiotic ear drops, use them as told by your health care provider. Do not stop using the antibiotic even if your condition improves.   Take over-the-counter and prescription medicines only as told by your health care provider.   Avoid getting water in your ears as told by your health care provider. This may include avoiding swimming or water sports for a few days.   Keep all follow-up visits as told by your health care provider. This is important.  How is this prevented?   Keep your ears dry. Use the corner of a towel to dry your ears after you swim or bathe.   Avoid scratching or putting things in your ear. Doing these things can damage the ear canal or remove the protective wax that lines it, which makes it easier for bacteria and funguses to  grow.   Avoid swimming in lakes, polluted water, or pools that may not have enough chlorine.  Contact a health care provider if:   You have a fever.   Your ear is still red, swollen, painful, or draining pus after 3 days.   Your redness, swelling, or pain gets worse.   You have a severe headache.   You have redness, swelling, pain, or tenderness in the area behind your ear.  Summary   Otitis externa is an infection of the outer ear canal.   Common causes include swimming in dirty water, moisture in the ear, or a cut or scrape in the ear.   Symptoms include pain, redness, and swelling of the ear.   If you were prescribed antibiotic ear drops, use them as told by your health care provider. Do not stop using the antibiotic even if your condition improves.  This information is not intended to replace advice given to you by your health care provider. Make sure you discuss any questions you have with your health care provider.  Document Released: 12/21/2004 Document Revised: 05/27/2017 Document Reviewed: 05/27/2017  Elsevier Interactive Patient Education  2019 Elsevier Inc[employer]

## 2018-04-19 NOTE — Progress Notes (Signed)
Patient: Caleb [name] Male    DOB: 04-10-1955   63 y.o.   MRN: [id number] Visit Date: 04/19/2018  Today's Provider: Mar Daring, PA-C   Chief Complaint  Patient presents with  . Ear Drainage   Subjective:     Ear Drainage   There is pain in the right ear. This is a recurrent problem. The current episode started more than 1 month ago. The problem occurs constantly. The problem has been gradually worsening. There has been no fever. Associated symptoms include ear discharge and hearing loss ("can't hear from right ear"). Pertinent negatives include no abdominal pain, coughing, headaches, rhinorrhea or sore throat. He has tried ear drops (Flush it out with warm water) for the symptoms.  He does report he gets recurrent external ear infections and his right ear is sensitive to air changes or strong winds.  Allergies  Allergen Reactions  . Lisinopril Cough     Current Outpatient Medications:  .  albuterol (PROVENTIL HFA) 108 (90 Base) MCG/ACT inhaler, INHALE 1-2 PUFFS EVERY 6 (SIX) HOURS AS NEEDED INTO THE LUNGS FOR WHEEZING OR SHORTNESS OF BREATH., Disp: 6.7 Inhaler, Rfl: 5 .  aspirin EC 81 MG tablet, Take 81 mg by mouth daily., Disp: , Rfl:  .  cetirizine (ZYRTEC) 10 MG tablet, Take 1 tablet (10 mg total) by mouth daily., Disp: 90 tablet, Rfl: 3 .  hydrochlorothiazide (HYDRODIURIL) 25 MG tablet, TAKE 1 TABLET BY MOUTH EVERY DAY (Patient taking differently: Take 25 mg by mouth daily. ), Disp: 30 tablet, Rfl: 11 .  [START ON 06/20/2018] HYDROcodone-acetaminophen (NORCO/VICODIN) 5-325 MG tablet, Take 1 tablet by mouth every 6 (six) hours as needed for up to 30 days for moderate pain., Disp: 120 tablet, Rfl: 0 .  isosorbide mononitrate (IMDUR) 30 MG 24 hr tablet, TAKE 0.5 TABLET(S) EVERY DAY BY ORAL ROUTE IN THE MORNING. (Patient taking differently: Take 15 mg by mouth daily. ), Disp: 15 tablet, Rfl: 5 .  losartan (COZAAR) 100 MG tablet, TAKE 1 TABLET BY MOUTH EVERY DAY,  Disp: 30 tablet, Rfl: 5 .  methocarbamol (ROBAXIN) 750 MG tablet, Take 1 tablet (750 mg total) by mouth every 8 (eight) hours as needed for muscle spasms., Disp: 90 tablet, Rfl: 5 .  metoprolol tartrate (LOPRESSOR) 25 MG tablet, TAKE 1 TABLET BY MOUTH TWICE A DAY, Disp: 60 tablet, Rfl: 11 .  nicotine (NICODERM CQ - DOSED IN MG/24 HOURS) 21 mg/24hr patch, Place 1 patch (21 mg total) onto the skin daily., Disp: 30 patch, Rfl: 5 .  nitroGLYCERIN (NITROSTAT) 0.4 MG SL tablet, Place 1 tablet (0.4 mg total) under the tongue every 5 (five) minutes as needed for chest pain., Disp: 30 tablet, Rfl: 3 .  pregabalin (LYRICA) 25 MG capsule, Take 1 capsule (25 mg total) by mouth 3 (three) times daily., Disp: 90 capsule, Rfl: 2 .  rosuvastatin (CRESTOR) 40 MG tablet, TAKE 1 TABLET BY MOUTH EVERY DAY (Patient taking differently: Take 40 mg by mouth daily. ), Disp: 90 tablet, Rfl: 3 .  sertraline (ZOLOFT) 100 MG tablet, Take 1 tablet (100 mg total) by mouth daily., Disp: 90 tablet, Rfl: 3 .  umeclidinium-vilanterol (ANORO ELLIPTA) 62.5-25 MCG/INH AEPB, Inhale 1 puff into the lungs daily., Disp: 60 each, Rfl: 5 .  triamcinolone ointment (KENALOG) 0.5 %, Apply 1 application 2 (two) times daily topically. On psoriasis lesions (Patient not taking: Reported on 04/19/2018), Disp: 30 g, Rfl: 2  Review of Systems  Constitutional: Negative.  Negative for fever.  HENT: Positive for ear discharge, ear pain and hearing loss ("can't hear from right ear"). Negative for congestion, nosebleeds, postnasal drip, rhinorrhea, sinus pressure, sinus pain, sneezing, sore throat and tinnitus.   Eyes: Negative for visual disturbance.  Respiratory: Negative for cough, chest tightness and shortness of breath.   Cardiovascular: Negative for chest pain, palpitations and leg swelling.  Gastrointestinal: Negative for abdominal pain and nausea.  Neurological: Negative for dizziness and headaches.    Social History   Tobacco Use  . Smoking  status: Current Every Day Smoker    Packs/day: 0.50    Years: 49.00    Pack years: 24.50    Types: Cigarettes  . Smokeless tobacco: Never Used  . Tobacco comment: started smoking at age 58; has decreased cigarette use from 2 PPD to 0.5 PPD to 1 PPD  Substance Use Topics  . Alcohol use: Yes    Alcohol/week: 4.0 standard drinks    Types: 4 Cans of beer per week      Objective:   BP 93/61 (BP Location: Left Arm, Patient Position: Sitting, Cuff Size: Large)   Pulse 60   Temp 97.7 F (36.5 C) (Oral)   Resp 16   Wt 195 lb 3.2 oz (88.5 kg)   BMI 29.68 kg/m  Vitals:   04/19/18 1048  BP: 93/61  Pulse: 60  Resp: 16  Temp: 97.7 F (36.5 C)  TempSrc: Oral  Weight: 195 lb 3.2 oz (88.5 kg)     Physical Exam Vitals signs reviewed.  Constitutional:      General: He is not in acute distress.    Appearance: Normal appearance. He is well-developed. He is obese. He is not ill-appearing or diaphoretic.  HENT:     Head: Normocephalic and atraumatic.     Right Ear: Hearing, tympanic membrane, ear canal and external ear normal. Drainage (thick yellow and white discharge noted), swelling and tenderness present. No middle ear effusion. Tympanic membrane is not erythematous or bulging.     Left Ear: Hearing, tympanic membrane, ear canal and external ear normal.  No middle ear effusion. Tympanic membrane is not erythematous or bulging.     Ears:     Comments: Cerumen impaction noted in the right ear as well; lavage performed and successful, TM visualized normal    Nose: Nose normal. No nasal tenderness, mucosal edema, congestion or rhinorrhea.     Right Sinus: No maxillary sinus tenderness or frontal sinus tenderness.     Left Sinus: No maxillary sinus tenderness or frontal sinus tenderness.     Mouth/Throat:     Mouth: Mucous membranes are moist.     Pharynx: Uvula midline. No oropharyngeal exudate or posterior oropharyngeal erythema.  Eyes:     General:        Right eye: No discharge.         Left eye: No discharge.     Extraocular Movements: Extraocular movements intact.     Conjunctiva/sclera: Conjunctivae normal.     Pupils: Pupils are equal, round, and reactive to light.  Neck:     Musculoskeletal: Normal range of motion and neck supple.     Thyroid: No thyromegaly.     Vascular: No JVD.     Trachea: No tracheal deviation.     Meningeal: Brudzinski's sign and Kernig's sign absent.  Cardiovascular:     Rate and Rhythm: Normal rate and regular rhythm.     Heart sounds: Normal heart sounds. No murmur. No friction rub. No  gallop.   Pulmonary:     Effort: Pulmonary effort is normal. No respiratory distress.     Breath sounds: Normal breath sounds. No stridor. No wheezing or rales.  Lymphadenopathy:     Cervical: No cervical adenopathy.  Skin:    General: Skin is warm and dry.  Neurological:     Mental Status: He is alert.         Assessment & Plan    1. Other infective acute otitis externa of right ear Swollen, erythematous external canal and auricle with tenderness and discolored drainage present. Will treat with cortisporin drops as below (to have there steroid as well for the inflammation present). Use for 5-7 days and call if not improving or if worsening. Also discussed trying to avoid getting water in his ears, use covers when showering, since he has a tendency to develop otitis externa. He agrees.  - neomycin-polymyxin-hydrocortisone (CORTISPORIN) 3.5-10000-1 OTIC suspension; Place 3 drops into the right ear 4 (four) times daily. X 5-7 days  Dispense: 10 mL; Refill: 0  2. Impacted cerumen of right ear Lavage successful.  - Ear Lavage     Mar Daring, PA-C  [facility]

## 2018-04-19 NOTE — Telephone Encounter (Signed)
Virtual Visit Pre-Appointment Phone Call  Steps For Call:  1. Confirm consent - "In the setting of the current Covid19 crisis, you are scheduled for a (phone or video) visit with your provider on (date) at (time).  Just as we do with many in-office visits, in order for you to participate in this visit, we must obtain consent.  If you'd like, I can send this to your mychart (if signed up) or email for you to review.  Otherwise, I can obtain your verbal consent now.  All virtual visits are billed to your insurance company just like a normal visit would be.  By agreeing to a virtual visit, we'd like you to understand that the technology does not allow for your provider to perform an examination, and thus may limit your provider's ability to fully assess your condition.  Finally, though the technology is pretty good, we cannot assure that it will always work on either your or our end, and in the setting of a video visit, we may have to convert it to a phone-only visit.  In either situation, we cannot ensure that we have a secure connection.  Are you willing to proceed?" STAFF: Did the patient verbally acknowledge consent to telehealth visit? Document YES/NO here: yes  2. Confirm the BEST phone number to call the day of the visit by including in appointment notes  3. Give patient instructions for WebEx/MyChart download to smartphone as below or Doximity/Doxy.me if video visit (depending on what platform provider is using)  4. Advise patient to be prepared with their blood pressure, heart rate, weight, any heart rhythm information, their current medicines, and a piece of paper and pen handy for any instructions they may receive the day of their visit  5. Inform patient they will receive a phone call 15 minutes prior to their appointment time (may be from unknown caller ID) so they should be prepared to answer  6. Confirm that appointment type is correct in Epic appointment notes (VIDEO vs  PHONE)     TELEPHONE CALL NOTE  Caleb [name] has been deemed a candidate for a follow-up tele-health visit to limit community exposure during the Covid-19 pandemic. I spoke with the patient via phone to ensure availability of phone/video source, confirm preferred email & phone number, and discuss instructions and expectations.  I reminded Caleb [name] to be prepared with any vital sign and/or heart rhythm information that could potentially be obtained via home monitoring, at the time of his visit. I reminded Caleb [name] to expect a phone call at the time of his visit if his visit.  Clarisse Gouge 04/19/2018 2:26 PM   INSTRUCTIONS FOR DOWNLOADING THE WEBEX APP TO SMARTPHONE  - If Apple, ask patient to go to [employer] and type in WebEx in the search bar. [place] [employer], the blue/green circle. If Android, go to [employer] and type in [employer] in the search bar. The app is free but as with any other app downloads, their phone may require them to verify saved payment information or Apple/Android password.  - The patient does NOT have to create an account. - On the day of the visit, the assist will walk the patient through joining the meeting with the meeting number/password.  INSTRUCTIONS FOR DOWNLOADING THE MYCHART APP TO SMARTPHONE  - The patient must first make sure to have activated MyChart and know their login information - If Apple, go to [employer] and type in  MyChart in the search bar and download the app. If Android, ask patient to go to [employer] and type in [place] in the search bar and download the app. The app is free but as with any other app downloads, their phone may require them to verify saved payment information or Apple/Android password.  - The patient will need to then log into the app with their MyChart username and password, and select [facility] as their healthcare provider to link the account. When it is time for your visit, go to  the MyChart app, find appointments, and click Begin Video Visit. Be sure to Select Allow for your device to access the Microphone and Camera for your visit. You will then be connected, and your provider will be with you shortly.  **If they have any issues connecting, or need assistance please contact MyChart service desk [phone])**  **If using a computer, in order to ensure the best quality for their visit they will need to use either of the following Internet Browsers: [employer], or Google Chrome**  IF USING DOXIMITY or DOXY.ME - The patient will receive a link just prior to their visit, either by text or email (to be determined day of appointment depending on if it's doxy.me or Doximity).     FULL LENGTH CONSENT FOR TELE-HEALTH VISIT   I hereby voluntarily request, consent and authorize [facility] and its employed or contracted physicians, physician assistants, nurse practitioners or other licensed health care professionals (the Practitioner), to provide me with telemedicine health care services (the Services") as deemed necessary by the treating Practitioner. I acknowledge and consent to receive the Services by the Practitioner via telemedicine. I understand that the telemedicine visit will involve communicating with the Practitioner through live audiovisual communication technology and the disclosure of certain medical information by electronic transmission. I acknowledge that I have been given the opportunity to request an in-person assessment or other available alternative prior to the telemedicine visit and am voluntarily participating in the telemedicine visit.  I understand that I have the right to withhold or withdraw my consent to the use of telemedicine in the course of my care at any time, without affecting my right to future care or treatment, and that the Practitioner or I may terminate the telemedicine visit at any time. I understand that I have the right to  inspect all information obtained and/or recorded in the course of the telemedicine visit and may receive copies of available information for a reasonable fee.  I understand that some of the potential risks of receiving the Services via telemedicine include:   Delay or interruption in medical evaluation due to technological equipment failure or disruption;  Information transmitted may not be sufficient (e.g. poor resolution of images) to allow for appropriate medical decision making by the Practitioner; and/or   In rare instances, security protocols could fail, causing a breach of personal health information.  Furthermore, I acknowledge that it is my responsibility to provide information about my medical history, conditions and care that is complete and accurate to the best of my ability. I acknowledge that Practitioner's advice, recommendations, and/or decision may be based on factors not within their control, such as incomplete or inaccurate data provided by me or distortions of diagnostic images or specimens that may result from electronic transmissions. I understand that the practice of medicine is not an exact science and that Practitioner makes no warranties or guarantees regarding treatment outcomes. I acknowledge that I will receive a copy  of this consent concurrently upon execution via email to the email address I last provided but may also request a printed copy by calling the office of [facility].    I understand that my insurance will be billed for this visit.   I have read or had this consent read to me.  I understand the contents of this consent, which adequately explains the benefits and risks of the Services being provided via telemedicine.   I have been provided ample opportunity to ask questions regarding this consent and the Services and have had my questions answered to my satisfaction.  I give my informed consent for the services to be provided through the use of  telemedicine in my medical care  By participating in this telemedicine visit I agree to the above.

## 2018-04-23 ENCOUNTER — Encounter: Payer: Self-pay | Admitting: Pulmonary Disease

## 2018-04-24 ENCOUNTER — Telehealth: Payer: Self-pay | Admitting: *Deleted

## 2018-04-24 NOTE — Telephone Encounter (Signed)
PA sent.  Patient notified.  
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
## 2018-04-26 ENCOUNTER — Telehealth: Payer: Self-pay

## 2018-04-26 ENCOUNTER — Other Ambulatory Visit: Payer: Self-pay

## 2018-04-26 ENCOUNTER — Telehealth (INDEPENDENT_AMBULATORY_CARE_PROVIDER_SITE_OTHER): Payer: Medicaid Other | Admitting: Cardiovascular Disease

## 2018-04-26 DIAGNOSIS — R054 Cough syncope: Secondary | ICD-10-CM

## 2018-04-26 DIAGNOSIS — I2510 Atherosclerotic heart disease of native coronary artery without angina pectoris: Secondary | ICD-10-CM

## 2018-04-26 DIAGNOSIS — R05 Cough: Secondary | ICD-10-CM | POA: Diagnosis not present

## 2018-04-26 DIAGNOSIS — E785 Hyperlipidemia, unspecified: Secondary | ICD-10-CM

## 2018-04-26 DIAGNOSIS — I10 Essential (primary) hypertension: Secondary | ICD-10-CM

## 2018-04-26 DIAGNOSIS — R55 Syncope and collapse: Secondary | ICD-10-CM | POA: Diagnosis not present

## 2018-04-26 DIAGNOSIS — J449 Chronic obstructive pulmonary disease, unspecified: Secondary | ICD-10-CM | POA: Diagnosis not present

## 2018-04-26 DIAGNOSIS — Z720 Tobacco use: Secondary | ICD-10-CM

## 2018-04-26 NOTE — Patient Instructions (Addendum)
We tried to contact you again by phone to follow up from your E-visit with Dr. .Rockey Situ, but were unsuccessful. If you have any questions about the recommendations listed below please feel free to call the office at [phone]. Thank you!  Medication Instructions:  1) Stop the HCTZ (hydrochlorothiazide)  2) Monitor the blood pressure at home  3) Check orthostatics blood pressure - lay down flat for 10 minutes, then check a blood pressure & heart rate laying down - sit up and immediately check your blood pressure & heart rate again while sitting,  - then stand up and immediately check your blood pressure & heart rate while standing - continue to stand for 3 minutes and then check your blood pressure & heart rate this one last time  4) Call us with numbers after about 1-2 weeks  If you need a refill on your cardiac medications before your next appointment, please call your pharmacy.    Lab work: No new labs needed   If you have labs (blood work) drawn today and your tests are completely normal, you will receive your results only by: Marland Kitchen MyChart Message (if you have MyChart) OR . A paper copy in the mail If you have any lab test that is abnormal or we need to change your treatment, we will call you to review the results.   Testing/Procedures: No new testing needed   Follow-Up: At [facility], you and your health needs are our priority.  As part of our continuing mission to provide you with exceptional heart care, we have created designated Provider Care Teams.  These Care Teams include your primary Cardiologist (physician) and Advanced Practice Providers (APPs -  Physician Assistants and Nurse Practitioners) who all work together to provide you with the care you need, when you need it.  . You will need a follow up appointment in 12 months .   Please call our office 2 months in advance to schedule this appointment.  (call in early February 2021)  . Providers on your designated  Care Team:   . Murray Hodgkins, NP . Christell Faith, PA-C . Marrianne Mood, PA-C  Any Other Special Instructions Will Be Listed Below (If Applicable).  For educational health videos Log in to : [email or web address] Or : [email or web address], password : [email or web address]

## 2018-04-26 NOTE — Telephone Encounter (Signed)
Called patient.  No answer. LMOV.  Need to review medications/allergies with patient.  Need to obtain vitals from patient.   

## 2018-04-28 ENCOUNTER — Other Ambulatory Visit: Payer: Self-pay | Admitting: Pain Medicine

## 2018-04-28 DIAGNOSIS — G8929 Other chronic pain: Secondary | ICD-10-CM

## 2018-04-28 DIAGNOSIS — M7918 Myalgia, other site: Principal | ICD-10-CM

## 2018-05-04 ENCOUNTER — Telehealth: Payer: Self-pay | Admitting: Pulmonary Disease

## 2018-05-09 ENCOUNTER — Other Ambulatory Visit: Payer: Self-pay | Admitting: Family Medicine

## 2018-05-09 ENCOUNTER — Other Ambulatory Visit: Payer: Self-pay | Admitting: Internal Medicine

## 2018-05-09 DIAGNOSIS — R1310 Dysphagia, unspecified: Secondary | ICD-10-CM

## 2018-05-10 NOTE — Telephone Encounter (Signed)
Appt. Rescheduled. Nothing further needed °

## 2018-05-22 ENCOUNTER — Encounter: Payer: Self-pay | Admitting: Pain Medicine

## 2018-05-22 NOTE — Progress Notes (Signed)
Pain Management Virtual Encounter Note - Virtual Visit via Telephone Telehealth (real-time audio visits between healthcare provider and patient).  Patient's Phone No. & Preferred Pharmacy:  [phone] (home); [phone] (mobile); (Preferred) [phone] [email or web address]  [id number] Lorina Rabon, [facility] [place] [address] [place] [address] Phone: [phone] Fax: [phone]   Pre-screening note:  Our staff contacted Caleb [name] and offered him an "in person", "face-to-face" appointment versus a telephone encounter. He indicated preferring the telephone encounter, at this time.  Reason for Virtual Visit: COVID-19*  Social distancing based on CDC and AMA recommendations.   I contacted Caleb [name] on 05/23/2018 at 10:11 AM via telephone.      I clearly identified myself as Gaspar Cola, MD. I verified that I was speaking with the correct person using two identifiers (Name and date of birth: 1955/08/20).  Advanced Informed Consent I sought verbal advanced consent from Caleb [name] for virtual visit interactions. I informed Caleb [name] of possible security and privacy concerns, risks, and limitations associated with providing "not-in-person" medical evaluation and management services. I also informed Caleb [name] of the availability of "in-person" appointments. Finally, I informed him that there would be a charge for the virtual visit and that he could be  personally, fully or partially, financially responsible for it. Caleb [name] expressed understanding and agreed to proceed.   Historic Elements   Caleb [name] is a 63 y.o. year old, male patient evaluated today after his last encounter by our practice on 04/28/2018. Caleb [name]  has a past medical history of Chronic foot pain, right (2015), COPD (chronic obstructive pulmonary disease) ([place]), Coronary artery disease, Hyperlipidemia, Hypertension, Leucocytosis, Myocardial infarction ([place]) (2015),  and OSA on CPAP. He also  has a past surgical history that includes Cardiac catheterization (2015); Lumbar laminectomy (1989, 1999); Cervical fusion (1988, 1998); Liver surgery (2015); Partial colectomy (1990); Inguinal hernia repair (Bilateral, 1975); Lithotripsy; and Colonoscopy with propofol (N/A, 02/24/2017). Caleb [name] has a current medication list which includes the following prescription(s): aspirin ec, cetirizine, hydrocodone-acetaminophen, isosorbide mononitrate, losartan, methocarbamol, metoprolol tartrate, neomycin-polymyxin-hydrocortisone, nitroglycerin, prednisone, pregabalin, rosuvastatin, sertraline, and umeclidinium-vilanterol. He  reports that he has been smoking cigarettes. He has a 24.50 pack-year smoking history. He has never used smokeless tobacco. He reports current alcohol use of about 4.0 standard drinks of alcohol per week. He reports that he does not use drugs. Caleb [name] is allergic to lisinopril.   HPI  I last communicated with him on 04/28/2018. Today, he is being contacted for medication management.  Pharmacotherapy Assessment  Analgesic: Hydrocodone/APAP 5/325 1 tablet every 6 hours (20 mg/day of hydrocodone) MME/day: 20 mg/day.   Monitoring: Pharmacotherapy: No side-effects or adverse reactions reported. [place] PMP: PDMP not reviewed this encounter.          Compliance: No problems identified or detected. Plan: Refer to "POC".  Review of recent tests  Cardiac event monitor Event Monitor  Normal sinus rhythm Avg HR of 68 bpm.   5 Ventricular Tachycardia runs occurred, the run with the fastest interval  lasting 7 beats with a max rate of 164 bpm, the longest lasting 6 beats with an avg rate of 119 bpm.   5 Pauses occurred the longest lasting 4.9 secs (12 bpm). All pauses between 2 and 5 Am  Isolated SVEs were rare (<1.0%), SVE Couplets were rare (<1.0%), and SVE  Triplets were rare (<1.0%).  Isolated VEs were occasional (3.3%, 30396), VE Couplets were rare  (<1.0%,  2560), and VE  Triplets were rare (<1.0%, 69). Ventricular Bigeminy and Trigeminy were present.  Signed, Esmond Plants, MD, Ph.D [facility]   Appointment on 04/13/2018  Component Date Value Ref Range Status  . Weight 04/13/2018 3,107.2  oz Final  . Height 04/13/2018 68  in Final  . BP 04/13/2018 126/60  mmHg Final   Assessment  The primary encounter diagnosis was Chronic pain syndrome. Diagnoses of Cervicalgia (Bilateral) (L>R), Cervical facet syndrome (Bilateral), Cervical disc disorder with radiculopathy of cervical region, DDD (degenerative disc disease), cervical, Disorder of superficial peroneal nerve (Right), Chronic foot pain (Primary Area of Pain) (Right), Chronic musculoskeletal pain, Neurogenic foot pain (Right), and Neuropathic pain were also pertinent to this visit.  Plan of Care  I have discontinued Caleb [name]'s triamcinolone ointment, HYDROcodone-acetaminophen, albuterol, and nicotine. I have also changed his HYDROcodone-acetaminophen. Additionally, I am having him start on predniSONE. Lastly, I am having him maintain his aspirin EC, nitroGLYCERIN, rosuvastatin, isosorbide mononitrate, losartan, metoprolol tartrate, sertraline, cetirizine, umeclidinium-vilanterol, neomycin-polymyxin-hydrocortisone, methocarbamol, and pregabalin.  Pharmacotherapy (Medications Ordered): Meds ordered this encounter  Medications  . predniSONE (DELTASONE) 20 MG tablet    Sig: Take 3 tablets (60 mg total) by mouth daily with breakfast for 3 days, THEN 2 tablets (40 mg total) daily with breakfast for 3 days, THEN 1 tablet (20 mg total) daily with breakfast for 3 days.    Dispense:  18 tablet    Refill:  0  . methocarbamol (ROBAXIN) 750 MG tablet    Sig: Take 1 tablet (750 mg total) by mouth every 8 (eight) hours as needed for muscle spasms.    Dispense:  270 tablet    Refill:  0    Fill one day early if pharmacy is closed on scheduled refill date. May substitute for generic if  available.  . pregabalin (LYRICA) 25 MG capsule    Sig: Take 1 capsule (25 mg total) by mouth 3 (three) times daily.    Dispense:  90 capsule    Refill:  2    Fill one day early if pharmacy is closed on scheduled refill date. May substitute for generic if available.  Marland Kitchen HYDROcodone-acetaminophen (NORCO/VICODIN) 5-325 MG tablet    Sig: Take 1 tablet by mouth every 6 (six) hours as needed for up to 30 days for severe pain.    Dispense:  120 tablet    Refill:  0    Chronic Pain. (STOP Act - Not applicable). Fill one day early if closed on scheduled refill date. Do not fill until: 07/20/18. To last until: 08/19/18.   Orders:  No orders of the defined types were placed in this encounter.  Follow-up plan:   Return in about 3 months (around 08/16/2018) for Med-Mgmt.   I discussed the assessment and treatment plan with the patient. The patient was provided an opportunity to ask questions and all were answered. The patient agreed with the plan and demonstrated an understanding of the instructions.  Patient advised to call back or seek an in-person evaluation if the symptoms or condition worsens.  Total duration of non-face-to-face encounter: 12 minutes.  Note by: Gaspar Cola, MD Date: 05/23/2018; Time: 10:11 AM  Disclaimer:  * Given the special circumstances of the COVID-19 pandemic, the federal government has announced that the Office for Civil Rights (OCR) will exercise its enforcement discretion and will not impose penalties on physicians using telehealth in the event of noncompliance with regulatory requirements under the [place] (HIPAA) in connection with the  good faith provision of telehealth during the [facility] national public health emergency. (AMA)

## 2018-05-23 ENCOUNTER — Other Ambulatory Visit: Payer: Self-pay

## 2018-05-23 ENCOUNTER — Telehealth: Payer: Self-pay

## 2018-05-23 ENCOUNTER — Ambulatory Visit: Payer: No Typology Code available for payment source | Attending: Pain Medicine | Admitting: Pain Medicine

## 2018-05-23 DIAGNOSIS — M542 Cervicalgia: Secondary | ICD-10-CM

## 2018-05-23 DIAGNOSIS — G5731 Lesion of lateral popliteal nerve, right lower limb: Secondary | ICD-10-CM

## 2018-05-23 DIAGNOSIS — M792 Neuralgia and neuritis, unspecified: Secondary | ICD-10-CM | POA: Diagnosis not present

## 2018-05-23 DIAGNOSIS — M47812 Spondylosis without myelopathy or radiculopathy, cervical region: Secondary | ICD-10-CM

## 2018-05-23 DIAGNOSIS — M7918 Myalgia, other site: Secondary | ICD-10-CM

## 2018-05-23 DIAGNOSIS — G894 Chronic pain syndrome: Secondary | ICD-10-CM | POA: Diagnosis not present

## 2018-05-23 DIAGNOSIS — M5030 Other cervical disc degeneration, unspecified cervical region: Secondary | ICD-10-CM

## 2018-05-23 DIAGNOSIS — M5010 Cervical disc disorder with radiculopathy, unspecified cervical region: Secondary | ICD-10-CM

## 2018-05-23 DIAGNOSIS — M79671 Pain in right foot: Secondary | ICD-10-CM | POA: Diagnosis not present

## 2018-05-23 DIAGNOSIS — G8929 Other chronic pain: Secondary | ICD-10-CM

## 2018-05-23 MED ORDER — METHOCARBAMOL 750 MG PO TABS: 750.0000 mg | ORAL_TABLET | Freq: Three times a day (TID) | ORAL | 0 refills | Status: DC | PRN

## 2018-05-23 MED ORDER — HYDROCODONE-ACETAMINOPHEN 5-325 MG PO TABS: 1.0000 | ORAL_TABLET | Freq: Four times a day (QID) | ORAL | 0 refills | Status: DC | PRN

## 2018-05-23 MED ORDER — PREGABALIN 25 MG PO CAPS: 25.0000 mg | ORAL_CAPSULE | Freq: Three times a day (TID) | ORAL | 2 refills | Status: DC

## 2018-05-23 MED ORDER — PREDNISONE 20 MG PO TABS: ORAL_TABLET | ORAL | 0 refills | Status: AC

## 2018-05-23 NOTE — Telephone Encounter (Signed)
[facility] the state says he has reached his limit.

## 2018-05-23 NOTE — Telephone Encounter (Signed)
Called Pharmacy and they stated that they had the predinsone ready for the patient and that it was $3.00 .Marland Kitchen I called the patient and left a voicemail stating that he could go pick up his medication from the pharm.

## 2018-05-30 ENCOUNTER — Other Ambulatory Visit: Payer: Self-pay

## 2018-05-30 ENCOUNTER — Other Ambulatory Visit
Admission: RE | Admit: 2018-05-30 | Discharge: 2018-05-30 | Disposition: A | Discharge status: Home / Self Care | Payer: Medicaid Other | Source: Ambulatory Visit | Attending: Internal Medicine | Admitting: Internal Medicine

## 2018-05-30 ENCOUNTER — Ambulatory Visit: Payer: No Typology Code available for payment source

## 2018-05-30 ENCOUNTER — Telehealth (INDEPENDENT_AMBULATORY_CARE_PROVIDER_SITE_OTHER): Payer: Medicaid Other | Admitting: Internal Medicine

## 2018-05-30 VITALS — BP 138/91 | HR 64 | Ht 68.0 in

## 2018-05-30 DIAGNOSIS — I4729 Other ventricular tachycardia: Secondary | ICD-10-CM

## 2018-05-30 DIAGNOSIS — E876 Hypokalemia: Secondary | ICD-10-CM

## 2018-05-30 DIAGNOSIS — I472 Ventricular tachycardia: Secondary | ICD-10-CM | POA: Diagnosis not present

## 2018-05-30 LAB — MAGNESIUM: Magnesium: 2 mg/dL (ref 1.7–2.4)

## 2018-05-30 LAB — BASIC METABOLIC PANEL
Anion gap: 10 (ref 5–15)
BUN: 28 mg/dL — ABNORMAL HIGH (ref 6–20)
CO2: 24 mmol/L (ref 22–32)
Calcium: 8.8 mg/dL — ABNORMAL LOW (ref 8.9–10.3)
Chloride: 104 mmol/L (ref 98–111)
Creatinine, Ser: 0.7 mg/dL (ref 0.61–1.24)
GFR calc Af Amer: >60 mL/min (ref >60)
GFR calc non Af Amer: >60 mL/min (ref >60)
Glucose, Bld: 114 mg/dL — ABNORMAL HIGH (ref 70–99)
Potassium: 4 mmol/L (ref 3.5–5.1)
Sodium: 138 mmol/L (ref 135–145)

## 2018-05-30 NOTE — Patient Instructions (Signed)
Caleb [name], it was nice speaking with you today. Here are a copy of your instructions from today's visit. Please let us know if you have any questions. Thank you, [name], RN  Medication Instructions:  - Your physician recommends that you continue on your current medications as directed. Please refer to the Current Medication list given to you today.  If you need a refill on your cardiac medications before your next appointment, please call your pharmacy.   Lab work: - Your physician recommends that you return for lab work at your convenience: BMP/ Magnesium  Go to the [employer] entrance at [facility]- 1st desk on the right to check in- Monday-Friday (7:30 am- 5:00 pm) You do not have to be fasting for these labs.   If you have labs (blood work) drawn today and your tests are completely normal, you will receive your results only by: Marland Kitchen MyChart Message (if you have MyChart) OR . A paper copy in the mail If you have any lab test that is abnormal or we need to change your treatment, we will call you to review the results.  Testing/Procedures: - none ordered  Follow-Up: At [facility], you and your health needs are our priority.  As part of our continuing mission to provide you with exceptional heart care, we have created designated Provider Care Teams.  These Care Teams include your primary Cardiologist (physician) and Advanced Practice Providers (APPs -  Physician Assistants and Nurse Practitioners) who all work together to provide you with the care you need, when you need it. Marland Kitchen as needed with Dr. Caryl Comes.  Any Other Special Instructions Will Be Listed Below (If Applicable). - N/A
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
## 2018-06-06 ENCOUNTER — Ambulatory Visit: Payer: Medicaid Other | Admitting: Pulmonary Disease

## 2018-06-08 ENCOUNTER — Other Ambulatory Visit: Payer: Self-pay

## 2018-06-15 ENCOUNTER — Encounter: Payer: Self-pay | Admitting: Family Medicine

## 2018-06-15 ENCOUNTER — Telehealth: Payer: Self-pay | Admitting: Cardiovascular Disease

## 2018-06-15 ENCOUNTER — Ambulatory Visit (INDEPENDENT_AMBULATORY_CARE_PROVIDER_SITE_OTHER): Payer: Medicaid Other | Admitting: Family Medicine

## 2018-06-15 VITALS — BP 131/88 | HR 44

## 2018-06-15 DIAGNOSIS — R457 State of emotional shock and stress, unspecified: Secondary | ICD-10-CM

## 2018-06-15 DIAGNOSIS — R079 Chest pain, unspecified: Secondary | ICD-10-CM

## 2018-06-15 DIAGNOSIS — F331 Major depressive disorder, recurrent, moderate: Secondary | ICD-10-CM

## 2018-06-15 DIAGNOSIS — R001 Bradycardia, unspecified: Secondary | ICD-10-CM | POA: Diagnosis not present

## 2018-06-15 DIAGNOSIS — S62515D Nondisplaced fracture of proximal phalanx of left thumb, subsequent encounter for fracture with routine healing: Secondary | ICD-10-CM | POA: Diagnosis not present

## 2018-06-15 DIAGNOSIS — I10 Essential (primary) hypertension: Secondary | ICD-10-CM | POA: Diagnosis not present

## 2018-06-15 DIAGNOSIS — F411 Generalized anxiety disorder: Secondary | ICD-10-CM | POA: Diagnosis not present

## 2018-06-15 NOTE — Telephone Encounter (Signed)
Pt c/o of Chest Pain: STAT if CP now or developed within 24 hours  1. Are you having CP right now? This morning   2. Are you experiencing any other symptoms (ex. SOB, nausea, vomiting, sweating)? SOB, HR 41  3. How long have you been experiencing CP? 2 weeks  4. Is your CP continuous or coming and going? Coming and going   5. Have you taken Nitroglycerin? No   Patient states he had a virtual appointment with his PCP this morning and they said he should call.  Stated they may want to do changes to metoprolol.  Please call to discuss and advise on appointment.

## 2018-06-15 NOTE — Assessment & Plan Note (Signed)
Well controlled Continue current meds Encourages DASH diet and exercise Reviewed last lipid panel

## 2018-06-15 NOTE — Assessment & Plan Note (Signed)
Chronic and well controlled Improved significantly with Zoloft Continue Zoloft 100mg  daily
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
## 2018-06-15 NOTE — Assessment & Plan Note (Signed)
Reports new episode of chest pain in last few days Worse with exertion None currently Was going to discuss with Cardiologist later today and advised him to go ahead and make that call and see if he can be seen in office/evisit Discussed strict precautions about what to go to ED for

## 2018-06-15 NOTE — Telephone Encounter (Signed)
For active chest pain, worsening SOB, and bradycardia I recommend he go to the ED at this time via EMS. With regards to his bradycardia, he should hold metoprolol for now. With his prior documented ventricular ectopy, it is difficult to know if his reading in the 40s bpm is accurate or falsely low. Further recommendations pending ED evaluation and follow up.
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
## 2018-06-15 NOTE — Telephone Encounter (Signed)
Recommendations given to patient and he verbalized understanding. He is going to stop his metoprolol for now.  He is not actively having chest pain at the moment.  He does not want to go at this time but if the chest pain returns he will go to the ER. Patient expressed he is concerned about the Covid virus. I reassured him that [facility] is doing what they can to test and keep patient's safe during this time. I stressed that he should not let the chest pain continue for fear of the virus and something heart-related happen that could have been prevented. He understood and assured me he would go at any point if it returned.

## 2018-06-15 NOTE — Patient Instructions (Signed)
Call your Cardiologist about the chest pain and slow heart rate  Orthopedics should call you about an appointment soon

## 2018-06-15 NOTE — Telephone Encounter (Signed)
Spoke with patient. States he's been having a 1 or more episodes of chest pain a day. It occurs at rest, especially in the evening, but has had in in the morning as well. Shortness of breath is ongoing but he thinks its a little worse. Denies left arm pain, dizziness, sweating, nausea or vomiting. Last myoview in March 2020 was abnormal.  Saw PCP today, BP was 131/88 and HR 44. Patient is unsure what his HR has been running at home. Patient states he feels better since stopping HCTZ from last virutal with Dr Rockey Situ. PCP is concerned that HR is so low.  Advised I will route to Christell Faith, PA who has seen patient in the past for further advice.

## 2018-06-15 NOTE — Assessment & Plan Note (Signed)
New problem Already took Metoprolol this AM, but will hold PM dose Will discuss with Cardiology as above

## 2018-06-16 ENCOUNTER — Other Ambulatory Visit
Admission: RE | Admit: 2018-06-16 | Discharge: 2018-06-16 | Disposition: A | Discharge status: Home / Self Care | Payer: Medicaid Other | Source: Ambulatory Visit | Attending: Pulmonary Disease | Admitting: Pulmonary Disease

## 2018-06-16 ENCOUNTER — Other Ambulatory Visit: Payer: Self-pay

## 2018-06-16 DIAGNOSIS — Z01812 Encounter for preprocedural laboratory examination: Secondary | ICD-10-CM | POA: Insufficient documentation

## 2018-06-16 DIAGNOSIS — Z1159 Encounter for screening for other viral diseases: Secondary | ICD-10-CM | POA: Diagnosis not present

## 2018-06-17 LAB — NOVEL CORONAVIRUS, NAA (HOSP ORDER, SEND-OUT TO REF LAB; TAT 18-24 HRS): SARS-CoV-2, NAA: NOT DETECTED

## 2018-06-20 ENCOUNTER — Ambulatory Visit: Payer: Medicaid Other

## 2018-06-21 ENCOUNTER — Ambulatory Visit: Payer: Medicaid Other | Attending: Pulmonary Disease

## 2018-06-21 ENCOUNTER — Other Ambulatory Visit: Payer: Self-pay

## 2018-06-21 DIAGNOSIS — F17200 Nicotine dependence, unspecified, uncomplicated: Secondary | ICD-10-CM | POA: Diagnosis not present

## 2018-06-21 MED ORDER — ALBUTEROL SULFATE (2.5 MG/3ML) 0.083% IN NEBU
2.5000 mg | INHALATION_SOLUTION | Freq: Once | RESPIRATORY_TRACT | Status: AC
  Administered 2018-06-21: 2.5 mg via RESPIRATORY_TRACT
  Filled 2018-06-21: qty 3

## 2018-06-22 ENCOUNTER — Ambulatory Visit: Payer: Medicaid Other | Admitting: Internal Medicine

## 2018-06-22 DIAGNOSIS — S62512A Displaced fracture of proximal phalanx of left thumb, initial encounter for closed fracture: Secondary | ICD-10-CM | POA: Diagnosis not present

## 2018-06-26 ENCOUNTER — Other Ambulatory Visit: Payer: Self-pay

## 2018-06-26 ENCOUNTER — Ambulatory Visit: Admission: RE | Admit: 2018-06-26 | Payer: Medicaid Other | Source: Ambulatory Visit

## 2018-06-26 ENCOUNTER — Ambulatory Visit
Admission: RE | Admit: 2018-06-26 | Discharge: 2018-06-26 | Disposition: A | Discharge status: Home / Self Care | Payer: Medicaid Other | Source: Ambulatory Visit | Attending: Family Medicine | Admitting: Family Medicine

## 2018-06-26 DIAGNOSIS — R55 Syncope and collapse: Secondary | ICD-10-CM

## 2018-06-26 DIAGNOSIS — R054 Cough syncope: Secondary | ICD-10-CM

## 2018-06-26 DIAGNOSIS — J449 Chronic obstructive pulmonary disease, unspecified: Secondary | ICD-10-CM

## 2018-06-26 DIAGNOSIS — R1310 Dysphagia, unspecified: Secondary | ICD-10-CM

## 2018-06-26 DIAGNOSIS — R05 Cough: Secondary | ICD-10-CM

## 2018-07-03 ENCOUNTER — Telehealth: Payer: Self-pay | Admitting: Pulmonary Disease

## 2018-07-03 NOTE — Telephone Encounter (Signed)
Called patient for COVID-19 pre-screening for in office visit.  Have you recently traveled any where out of the local area in the last 2 weeks? No Have you been in close contact with a person diagnosed with COVID-19 or someone awaiting results within the last 2 weeks? No  Do you currently have any of the following symptoms? If so, when did they start? Cough     Diarrhea   Joint Pain Fever      Muscle Pain   Red eyes Shortness of breath (Yes- nothing new)  Abdominal pain             Vomiting Loss of smell    Rash    Sore Throat Headache    Weakness   Bruising or bleeding  Okay to proceed with visit 07/05/2018

## 2018-07-05 ENCOUNTER — Encounter: Payer: Self-pay | Admitting: Pulmonary Disease

## 2018-07-05 ENCOUNTER — Other Ambulatory Visit: Payer: Self-pay

## 2018-07-05 ENCOUNTER — Ambulatory Visit: Payer: Medicaid Other | Admitting: Pain Medicine

## 2018-07-05 ENCOUNTER — Ambulatory Visit (INDEPENDENT_AMBULATORY_CARE_PROVIDER_SITE_OTHER): Payer: Medicaid Other | Admitting: Pulmonary Disease

## 2018-07-05 VITALS — BP 140/80 | HR 95 | Temp 98.4°F | Ht 68.0 in | Wt 199.4 lb

## 2018-07-05 DIAGNOSIS — J449 Chronic obstructive pulmonary disease, unspecified: Secondary | ICD-10-CM

## 2018-07-05 DIAGNOSIS — G4733 Obstructive sleep apnea (adult) (pediatric): Secondary | ICD-10-CM

## 2018-07-05 DIAGNOSIS — F17200 Nicotine dependence, unspecified, uncomplicated: Secondary | ICD-10-CM | POA: Diagnosis not present

## 2018-07-05 MED ORDER — CHANTIX STARTING MONTH PAK 0.5 MG X 11 & 1 MG X 42 PO TABS: ORAL_TABLET | ORAL | 0 refills | Status: DC

## 2018-07-05 MED ORDER — VARENICLINE TARTRATE 1 MG PO TABS: 1.0000 mg | ORAL_TABLET | Freq: Two times a day (BID) | ORAL | 5 refills | Status: DC
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
## 2018-07-05 NOTE — Patient Instructions (Signed)
Continue Anoro inhaler daily  Continue albuterol inhaler as needed for increased shortness of breath, wheezing, chest tightness, cough  We again discussed the necessity of smoking cessation.  You agreed to try Chantix.  I have placed prescription for the starter pack and the continuation pack.  Fill the starter pack first to make sure that she tolerated the medication.  Start the Chantix 3-5 days prior to your "quit date"  Follow-up in 3-4 months.  Call sooner if needed

## 2018-07-06 ENCOUNTER — Encounter: Payer: Self-pay | Admitting: Pain Medicine

## 2018-07-06 ENCOUNTER — Encounter: Payer: Medicaid Other | Admitting: Nurse Practitioner

## 2018-07-09 NOTE — Progress Notes (Signed)
Pain Management Virtual Encounter Note - Virtual Visit via Telephone Telehealth (real-time audio visits between healthcare provider and patient).   Patient's Phone No. & Preferred Pharmacy:  [phone] (home); [phone] (mobile); (Preferred) [phone] [email or web address]  [id number] Lorina Rabon, [facility] [address] [place] [address] Phone: [phone] Fax: [phone]    Pre-screening note:  Our staff contacted Caleb [name] and offered him an "in person", "face-to-face" appointment versus a telephone encounter. He indicated preferring the telephone encounter, at this time.   Reason for Virtual Visit: COVID-19*  Social distancing based on CDC and AMA recommendations.   I contacted Caleb [name] on 07/10/2018 via telephone.      I clearly identified myself as Gaspar Cola, MD. I verified that I was speaking with the correct person using two identifiers (Name: Caleb [name], and date of birth: Nov 09, 1955).  Advanced Informed Consent I sought verbal advanced consent from Caleb [name] for virtual visit interactions. I informed Caleb [name] of possible security and privacy concerns, risks, and limitations associated with providing "not-in-person" medical evaluation and management services. I also informed Caleb [name] of the availability of "in-person" appointments. Finally, I informed him that there would be a charge for the virtual visit and that he could be  personally, fully or partially, financially responsible for it. Mr. Shannahan expressed understanding and agreed to proceed.   Historic Elements   Caleb [name] is a 63 y.o. year old, male patient evaluated today after his last encounter by our practice on 05/23/2018. Caleb [name]  has a past medical history of Chronic foot pain, right (2015), COPD (chronic obstructive pulmonary disease) ([place]), Coronary artery disease, Hyperlipidemia, Hypertension, Leucocytosis, Myocardial infarction  ([place]) (2015), and OSA on CPAP. He also  has a past surgical history that includes Cardiac catheterization (2015); Lumbar laminectomy (1989, 1999); Cervical fusion (1988, 1998); Liver surgery (2015); Partial colectomy (1990); Inguinal hernia repair (Bilateral, 1975); Lithotripsy; and Colonoscopy with propofol (N/A, 02/24/2017). Caleb [name] has a current medication list which includes the following prescription(s): aspirin ec, cetirizine, fluticasone, hydrocodone-acetaminophen, hydrocodone-acetaminophen, hydrocodone-acetaminophen, isosorbide mononitrate, losartan, methocarbamol, neomycin-polymyxin-hydrocortisone, nitroglycerin, pregabalin, rosuvastatin, sertraline, umeclidinium-vilanterol, varenicline, and chantix starting month pak. He  reports that he has been smoking cigarettes. He has a 24.50 pack-year smoking history. He has never used smokeless tobacco. He reports current alcohol use of about 4.0 standard drinks of alcohol per week. He reports that he does not use drugs. Caleb [name] is allergic to lisinopril.   HPI  Today, he is being contacted for medication management.  Pharmacotherapy Assessment  Analgesic: Hydrocodone/APAP 5/325 1 tablet every 6 hours (20 mg/day of hydrocodone) MME/day: 20 mg/day.   Monitoring: Pharmacotherapy: No side-effects or adverse reactions reported. [place] PMP: PDMP reviewed during this encounter.       Compliance: No problems identified. Effectiveness: Clinically acceptable. Plan: Refer to "POC".  Pertinent Labs   SAFETY SCREENING Profile Lab Results  Component Value Date   SARSCOV2NAA NOT DETECTED 06/16/2018   COVIDSOURCE NASOPHARYNGEAL 06/16/2018   HIV Non Reactive 02/04/2017   Renal Function Lab Results  Component Value Date   BUN 28 (H) 05/30/2018   CREATININE 0.70 05/30/2018   BCR 23 03/15/2018   GFRAA >60 05/30/2018   GFRNONAA >60 05/30/2018   Hepatic Function Lab Results  Component Value Date   AST 21 02/09/2018   ALT 20 02/09/2018   ALBUMIN  3.6 02/09/2018   UDS Summary  Date Value Ref Range Status  12/21/2017 FINAL  Final  Comment:    ==================================================================== TOXASSURE SELECT 13 (MW) ==================================================================== Test                             Result       Flag       Units Drug Present and Declared for Prescription Verification   Oxycodone                      276          EXPECTED   ng/mg creat   Oxymorphone                    1086         EXPECTED   ng/mg creat   Noroxycodone                   1214         EXPECTED   ng/mg creat   Noroxymorphone                 397          EXPECTED   ng/mg creat    Sources of oxycodone are scheduled prescription medications.    Oxymorphone, noroxycodone, and noroxymorphone are expected    metabolites of oxycodone. Oxymorphone is also available as a    scheduled prescription medication. Drug Absent but Declared for Prescription Verification   Hydrocodone                    Not Detected UNEXPECTED ng/mg creat ==================================================================== Test                      Result    Flag   Units      Ref Range   Creatinine              96               mg/dL      >=20 ==================================================================== Declared Medications:  The flagging and interpretation on this report are based on the  following declared medications.  Unexpected results may arise from  inaccuracies in the declared medications.  **Note: The testing scope of this panel includes these medications:  Hydrocodone (Norco)  Oxycodone  **Note: The testing scope of this panel does not include following  reported medications:  Acetaminophen (Norco)  Albuterol  Aspirin (Aspirin 81)  Fluticasone (Flovent)  Hydrochlorothiazide (Hydrodiuril)  Isosorbide (Imdur)  Losartan (Cozaar)  Methocarbamol (Robaxin)  Metoprolol (Lopressor)  Nitroglycerin (Nitrostat)  Pregabalin  (Lyrica)  Rosuvastatin (Crestor)  Secukinumab  Tiotropium (Spiriva)  Triamcinolone (Kenalog) ==================================================================== For clinical consultation, please call [phone]. ====================================================================    Note: Above Lab results reviewed.  Recent imaging  Pulmonary Function Test [facility] Only Spirometry Data Is Acceptable and Reproducible  Moderate Obstructive Airways Disease without Significant Broncho-Dilator  Response  Consider outpatient Pulmonary Consultation if needed Clinical Correlation Advised  Assessment  The primary encounter diagnosis was Chronic pain syndrome. 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  Plan of Care  I have changed Caleb [name]'s HYDROcodone-acetaminophen and pregabalin. I am also having him start on HYDROcodone-acetaminophen and HYDROcodone-acetaminophen. Additionally, I am having him  maintain his aspirin EC, nitroGLYCERIN, rosuvastatin, isosorbide mononitrate, losartan, sertraline, cetirizine, umeclidinium-vilanterol, neomycin-polymyxin-hydrocortisone, Chantix Starting Month Pak, varenicline, fluticasone, and methocarbamol.  Pharmacotherapy (Medications Ordered): Meds ordered this encounter  Medications  . methocarbamol (ROBAXIN) 750 MG tablet    Sig: Take 1 tablet (750 mg total) by mouth every 8 (eight) hours as needed for muscle spasms.    Dispense:  270 tablet    Refill:  0    Fill one day early if pharmacy is closed on scheduled refill date. May substitute for generic if available.  Marland Kitchen HYDROcodone-acetaminophen (NORCO/VICODIN) 5-325 MG tablet    Sig: Take 1 tablet by mouth  every 6 (six) hours as needed for severe pain.    Dispense:  120 tablet    Refill:  0    Chronic Pain. (STOP Act - Not applicable). Fill one day early if closed on scheduled refill date. Do not fill until: 08/19/2018. To last until: 09/18/2018.  . pregabalin (LYRICA) 50 MG capsule    Sig: Take 1 capsule (50 mg total) by mouth 3 (three) times daily.    Dispense:  90 capsule    Refill:  2    Fill one day early if pharmacy is closed on scheduled refill date. May substitute for generic if available.  Marland Kitchen HYDROcodone-acetaminophen (NORCO/VICODIN) 5-325 MG tablet    Sig: Take 1 tablet by mouth every 6 (six) hours as needed for severe pain.    Dispense:  120 tablet    Refill:  0    Chronic Pain. (STOP Act - Not applicable). Fill one day early if closed on scheduled refill date. Do not fill until: 09/18/2018. To last until: 10/18/2018.  Marland Kitchen HYDROcodone-acetaminophen (NORCO/VICODIN) 5-325 MG tablet    Sig: Take 1 tablet by mouth every 6 (six) hours as needed for severe pain.    Dispense:  120 tablet    Refill:  0    Chronic Pain. (STOP Act - Not applicable). Fill one day early if closed on scheduled refill date. Do not fill until: 10/18/2018. To last until: 11/17/2018.   Orders:  Orders Placed This Encounter  Procedures  . Misc procedure    Standing Status:   Future    Standing Expiration Date:   01/10/2020    Scheduling Instructions:     Type of Block:  Palliative/diagnostic right-sidedCommon Peroneal nerve block#3     Side: Right-sided     Sedation: With Sedation.     Timeframe: ASAA   Follow-up plan:   Return in 4 months (on 11/15/2018) for (VV), E/M (MM), in addition, Procedure (w/ sedation): (R) Peroneal NB #3, (ASAP).      Interventional management options: Considering:   Palliative/diagnostic right-sidedCommon Peroneal nerve block#3 Palliative right-sided common peroneal nerve RFA #2(last done on 06/21/2017) Diagnostic right ankle block Diagnosticright lumbar sympathetic  block Diagnostic midlinethoracic epidural steroid injection Diagnostic bilateral thoracic facet block Possible bilateral thoracic facet RFA Diagnostic cervical epidural steroid injection Diagnostic bilateral cervical facet block#3 Possible bilateral cervical facet RFA   Palliative PRN treatment(s):   Palliative left sided cervical epidural steroid injection #4 Palliative right-sided common peroneal nerve blocks Palliative right-sided common peroneal nerve RFA #2(last done on 06/21/2017)    Recent Visits Date Type Provider Dept  05/23/18 Office Visit Milinda Pointer, MD [facility]  Showing recent visits within past 90 days and meeting all other requirements   Today's Visits Date Type Provider Dept  07/10/18 Office Visit Milinda Pointer, MD [facility]  Showing today's visits and meeting all other  requirements   Future Appointments Date Type Provider Dept  08/16/18 Appointment Milinda Pointer, MD [facility]  Showing future appointments within next 90 days and meeting all other requirements   I discussed the assessment and treatment plan with the patient. The patient was provided an opportunity to ask questions and all were answered. The patient agreed with the plan and demonstrated an understanding of the instructions.  Patient advised to call back or seek an in-person evaluation if the symptoms or condition worsens.  Total duration of non-face-to-face encounter: 15 minutes.  Note by: Gaspar Cola, MD Date: 07/10/2018; Time: 8:40 AM  Note: This dictation was prepared with Dragon dictation. Any transcriptional errors that may result from this process are unintentional.  Disclaimer:  * Given the special circumstances of the COVID-19 pandemic, the federal government has announced that the Office for Civil Rights (OCR) will exercise its enforcement discretion and will not impose penalties on physicians using telehealth in  the event of noncompliance with regulatory requirements under the [place] (HIPAA) in connection with the good faith provision of telehealth during the IOEVO-35 national public health emergency. ([place])

## 2018-07-10 ENCOUNTER — Other Ambulatory Visit: Payer: Self-pay

## 2018-07-10 ENCOUNTER — Ambulatory Visit: Payer: Medicaid Other | Attending: Pain Medicine | Admitting: Pain Medicine

## 2018-07-10 DIAGNOSIS — G894 Chronic pain syndrome: Secondary | ICD-10-CM | POA: Diagnosis not present

## 2018-07-10 DIAGNOSIS — M546 Pain in thoracic spine: Secondary | ICD-10-CM

## 2018-07-10 DIAGNOSIS — M7918 Myalgia, other site: Secondary | ICD-10-CM | POA: Diagnosis not present

## 2018-07-10 DIAGNOSIS — M792 Neuralgia and neuritis, unspecified: Secondary | ICD-10-CM

## 2018-07-10 DIAGNOSIS — M25571 Pain in right ankle and joints of right foot: Secondary | ICD-10-CM

## 2018-07-10 DIAGNOSIS — S8411XS Injury of peroneal nerve at lower leg level, right leg, sequela: Secondary | ICD-10-CM | POA: Insufficient documentation

## 2018-07-10 DIAGNOSIS — M79671 Pain in right foot: Secondary | ICD-10-CM

## 2018-07-10 DIAGNOSIS — G8929 Other chronic pain: Secondary | ICD-10-CM

## 2018-07-10 MED ORDER — HYDROCODONE-ACETAMINOPHEN 5-325 MG PO TABS: 1.0000 | ORAL_TABLET | Freq: Four times a day (QID) | ORAL | 0 refills | Status: DC | PRN

## 2018-07-10 MED ORDER — PREGABALIN 50 MG PO CAPS: 50.0000 mg | ORAL_CAPSULE | Freq: Three times a day (TID) | ORAL | 2 refills | Status: DC

## 2018-07-10 MED ORDER — METHOCARBAMOL 750 MG PO TABS: 750.0000 mg | ORAL_TABLET | Freq: Three times a day (TID) | ORAL | 0 refills | Status: DC | PRN

## 2018-07-10 NOTE — Patient Instructions (Signed)

## 2018-07-13 ENCOUNTER — Ambulatory Visit
Admission: RE | Admit: 2018-07-13 | Discharge: 2018-07-13 | Disposition: A | Discharge status: Home / Self Care | Payer: Medicaid Other | Source: Ambulatory Visit | Attending: Physician Assistant | Admitting: Physician Assistant

## 2018-07-13 ENCOUNTER — Other Ambulatory Visit: Payer: Self-pay

## 2018-07-13 DIAGNOSIS — R1312 Dysphagia, oropharyngeal phase: Secondary | ICD-10-CM | POA: Diagnosis present

## 2018-07-13 DIAGNOSIS — R1310 Dysphagia, unspecified: Secondary | ICD-10-CM | POA: Insufficient documentation
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
## 2018-07-14 ENCOUNTER — Other Ambulatory Visit
Admission: RE | Admit: 2018-07-14 | Discharge: 2018-07-14 | Disposition: A | Discharge status: Home / Self Care | Payer: Medicaid Other | Source: Ambulatory Visit | Attending: Pain Medicine | Admitting: Pain Medicine

## 2018-07-14 DIAGNOSIS — Z01812 Encounter for preprocedural laboratory examination: Secondary | ICD-10-CM | POA: Insufficient documentation

## 2018-07-14 DIAGNOSIS — Z1159 Encounter for screening for other viral diseases: Secondary | ICD-10-CM | POA: Diagnosis not present

## 2018-07-15 LAB — SARS CORONAVIRUS 2 (TAT 6-24 HRS): SARS Coronavirus 2: NEGATIVE

## 2018-07-18 ENCOUNTER — Ambulatory Visit
Admission: RE | Admit: 2018-07-18 | Discharge: 2018-07-18 | Disposition: A | Discharge status: Home / Self Care | Payer: Medicaid Other | Source: Ambulatory Visit | Attending: Pain Medicine | Admitting: Pain Medicine

## 2018-07-18 ENCOUNTER — Ambulatory Visit (HOSPITAL_BASED_OUTPATIENT_CLINIC_OR_DEPARTMENT_OTHER): Payer: Medicaid Other | Admitting: Pain Medicine

## 2018-07-18 ENCOUNTER — Other Ambulatory Visit: Payer: Self-pay

## 2018-07-18 ENCOUNTER — Encounter: Payer: Self-pay | Admitting: Pain Medicine

## 2018-07-18 VITALS — BP 131/80 | HR 71 | Temp 97.9°F | Resp 14 | Ht 68.0 in | Wt 198.0 lb

## 2018-07-18 DIAGNOSIS — S8411XS Injury of peroneal nerve at lower leg level, right leg, sequela: Secondary | ICD-10-CM | POA: Diagnosis not present

## 2018-07-18 DIAGNOSIS — M25571 Pain in right ankle and joints of right foot: Secondary | ICD-10-CM | POA: Diagnosis not present

## 2018-07-18 DIAGNOSIS — G5731 Lesion of lateral popliteal nerve, right lower limb: Secondary | ICD-10-CM | POA: Diagnosis not present

## 2018-07-18 DIAGNOSIS — G8929 Other chronic pain: Secondary | ICD-10-CM | POA: Insufficient documentation

## 2018-07-18 DIAGNOSIS — M79671 Pain in right foot: Secondary | ICD-10-CM | POA: Diagnosis not present

## 2018-07-18 MED ORDER — TRIAMCINOLONE ACETONIDE 40 MG/ML IJ SUSP
40.0000 mg | Freq: Once | INTRAMUSCULAR | Status: AC
  Administered 2018-07-18: 40 mg via INTRA_ARTICULAR
  Filled 2018-07-18: qty 1

## 2018-07-18 MED ORDER — LIDOCAINE HCL 2 % IJ SOLN
20.0000 mL | Freq: Once | INTRAMUSCULAR | Status: AC
  Administered 2018-07-18: 400 mg
  Filled 2018-07-18: qty 40

## 2018-07-18 MED ORDER — DEXAMETHASONE SODIUM PHOSPHATE 10 MG/ML IJ SOLN
10.0000 mg | Freq: Once | INTRAMUSCULAR | Status: AC
  Administered 2018-07-18: 10 mg
  Filled 2018-07-18: qty 1

## 2018-07-18 MED ORDER — MIDAZOLAM HCL 5 MG/5ML IJ SOLN
1.0000 mg | INTRAMUSCULAR | Status: DC | PRN
  Administered 2018-07-18: 3 mg via INTRAVENOUS
  Filled 2018-07-18: qty 5

## 2018-07-18 MED ORDER — FENTANYL CITRATE (PF) 100 MCG/2ML IJ SOLN
25.0000 ug | INTRAMUSCULAR | Status: DC | PRN
  Administered 2018-07-18: 100 ug via INTRAVENOUS
  Filled 2018-07-18: qty 2

## 2018-07-18 MED ORDER — ROPIVACAINE HCL 2 MG/ML IJ SOLN
8.0000 mL | Freq: Once | INTRAMUSCULAR | Status: AC
  Administered 2018-07-18: 8 mL
  Filled 2018-07-18: qty 10

## 2018-07-18 MED ORDER — LACTATED RINGERS IV SOLN
1000.0000 mL | Freq: Once | INTRAVENOUS | Status: AC
  Administered 2018-07-18: 10:00:00 1000 mL via INTRAVENOUS

## 2018-07-18 NOTE — Progress Notes (Signed)
Patient's Name: Caleb [name]  MRN: [id number]  Referring Provider: Virginia Crews, MD  DOB: 1955/02/19  PCP: Virginia Crews, MD  DOS: 07/18/2018  Note by: Gaspar Cola, MD  Service setting: Ambulatory outpatient  Specialty: Interventional Pain Management  Patient type: Established  Location: [facility] (AMB) Pain Management Facility  Visit type: Interventional Procedure   Primary Reason for Visit: Interventional Pain Management Treatment. CC: Foot Pain (right)  Procedure:          Anesthesia, Analgesia, Anxiolysis:  Type: Diagnostic Superficial Peroneal/Fibular (S-PN)           Nerve Block #1 s/p RFA on 10/25/2017 Region: Anterolateral aspect of fibula Level: Distal lower extremity. Laterality: Right-Side  Type: Moderate (Conscious) Sedation combined with Local Anesthesia Indication(s): Analgesia and Anxiety Local Anesthetic: Lidocaine 1-2% Route: Intravenous (IV) IV Access: Secured Sedation: Meaningful verbal contact was maintained at all times during the procedure   Position: Supine   Indications: 1. 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 Disorder of superficial peroneal nerve (Right)   3. Chronic foot pain (Primary Area of Pain) (Right)   4. Chronic ankle pain (Secondary Area of Pain) (Right)    Pain Score: Pre-procedure: 4 /10 Post-procedure: 2 /10  Pertinent Labs  COVID-19 screennig: Lab Results  Component Value Date   SARSCOV2NAA NEGATIVE 07/14/2018   Pre-op Assessment:  Caleb [name] is a 63 y.o. (year old), male patient, seen today for interventional treatment. He  has a past surgical history that includes Cardiac catheterization (2015); Lumbar laminectomy (1989, 1999); Cervical fusion (1988, 1998); Liver surgery (2015); Partial colectomy (1990); Inguinal hernia repair (Bilateral, 1975); Lithotripsy; and Colonoscopy with propofol (N/A, 02/24/2017). [name] has a current medication list which includes the following prescription(s): aspirin ec,  cetirizine, fluticasone, hydrocodone-acetaminophen, hydrocodone-acetaminophen, hydrocodone-acetaminophen, isosorbide mononitrate, losartan, methocarbamol, neomycin-polymyxin-hydrocortisone, nitroglycerin, pregabalin, rosuvastatin, sertraline, umeclidinium-vilanterol, varenicline, and chantix starting month pak, and the following Facility-Administered Medications: fentanyl and midazolam. His primarily concern today is the Foot Pain (right)  Initial Vital Signs:  Pulse/HCG Rate: 71ECG Heart Rate: 67 Temp: 97.9 F (36.6 C) Resp: 16 BP: 133/89 SpO2: 98 %  BMI: Estimated body mass index is 30.11 kg/m as calculated from the following:   Height as of this encounter: 5\' 8"  (1.727 m).   Weight as of this encounter: 198 lb (89.8 kg).  Risk Assessment: Allergies: Reviewed. He is allergic to lisinopril.  Allergy Precautions: None required Coagulopathies: Reviewed. None identified.  Blood-thinner therapy: None at this time Active Infection(s): Reviewed. None identified. Caleb [name] is afebrile  Site Confirmation: [name] was asked to confirm the procedure and laterality before marking the site Procedure checklist: Completed Consent: Before the procedure and under the influence of no sedative(s), amnesic(s), or anxiolytics, the patient was informed of the treatment options, risks and possible complications. To fulfill our ethical and legal obligations, as recommended by the American Medical Association's Code of Ethics, I have informed the patient of my clinical impression; the nature and purpose of the treatment or procedure; the risks, benefits, and possible complications of the intervention; the alternatives, including doing nothing; the risk(s) and benefit(s) of the alternative treatment(s) or procedure(s); and the risk(s) and benefit(s) of doing nothing. The patient was provided information about the general risks and possible complications associated with the procedure. These may include, but are  not limited to: failure to achieve desired goals, infection, bleeding, organ or nerve damage, allergic reactions, paralysis, and death. In addition, the patient was informed of those risks and complications  associated to the procedure, such as failure to decrease pain; infection; bleeding; organ or nerve damage with subsequent damage to sensory, motor, and/or autonomic systems, resulting in permanent pain, numbness, and/or weakness of one or several areas of the body; allergic reactions; (i.e.: anaphylactic reaction); and/or death. Furthermore, the patient was informed of those risks and complications associated with the medications. These include, but are not limited to: allergic reactions (i.e.: anaphylactic or anaphylactoid reaction(s)); adrenal axis suppression; blood sugar elevation that in diabetics may result in ketoacidosis or comma; water retention that in patients with history of congestive heart failure may result in shortness of breath, pulmonary edema, and decompensation with resultant heart failure; weight gain; swelling or edema; medication-induced neural toxicity; particulate matter embolism and blood vessel occlusion with resultant organ, and/or nervous system infarction; and/or aseptic necrosis of one or more joints. Finally, the patient was informed that Medicine is not an exact science; therefore, there is also the possibility of unforeseen or unpredictable risks and/or possible complications that may result in a catastrophic outcome. The patient indicated having understood very clearly. We have given the patient no guarantees and we have made no promises. Enough time was given to the patient to ask questions, all of which were answered to the patient's satisfaction. [name] has indicated that he wanted to continue with the procedure. Attestation: I, the ordering provider, attest that I have discussed with the patient the benefits, risks, side-effects, alternatives, likelihood of achieving  goals, and potential problems during recovery for the procedure that I have provided informed consent. Date   Time: 07/18/2018  8:24 AM  Pre-Procedure Preparation:  Monitoring: As per clinic protocol. Respiration, ETCO2, SpO2, BP, heart rate and rhythm monitor placed and checked for adequate function Safety Precautions: Patient was assessed for positional comfort and pressure points before starting the procedure. Time-out: I initiated and conducted the "Time-out" before starting the procedure, as per protocol. The patient was asked to participate by confirming the accuracy of the "Time Out" information. Verification of the correct person, site, and procedure were performed and confirmed by me, the nursing staff, and the patient. "Time-out" conducted as per Joint Commission's Universal Protocol (UP.01.01.01). Time: 0941  Description of Procedure:          Target Area: For the superficial peroneal [fibular] nerve, the target area is the lateral aspect of the distal lower extremity, above the lateral malleolus, as the nerve exits the crural fascia and the lateral compartment of the leg. Approach: Percutaneous approach. Area Prepped: Entire distal anterolateral lower extremity region Prepping solution: DuraPrep (Iodine Povacrylex [0.7% available iodine] and Isopropyl Alcohol, 74% w/w) Safety Precautions: Aspiration looking for blood return was conducted prior to all injections. At no point did we inject any substances, as a needle was being advanced. No attempts were made at seeking any paresthesias. Safe injection practices and needle disposal techniques used. Medications properly checked for expiration dates. SDV (single dose vial) medications used. Description of the Procedure: Protocol guidelines were followed. The patient was placed in position. The target area was identified and the area prepped in the usual manner. Skin & deeper tissues infiltrated with local anesthetic. Appropriate amount of time  allowed to pass for local anesthetics to take effect. The procedure needles were then advanced to the target area. Proper needle placement secured. Negative aspiration confirmed. Solution injected in intermittent fashion, asking for systemic symptoms every 0.5cc of injectate. The needles were then removed and the area cleansed, making sure to leave some of the prepping solution  back to take advantage of its long term bactericidal properties.         Vitals:   07/18/18 0946 07/18/18 0956 07/18/18 1006 07/18/18 1015  BP: 128/85 127/79 118/83 131/80  Pulse:      Resp: 12 19 13 14   Temp:      SpO2: 95% 94% 95% 96%  Weight:      Height:        Start Time: 0941 hrs. End Time: 0945 hrs.  Materials:  Needle(s) Type: Spinal Needle Gauge: 25G Length: 1.5-in Medication(s): Please see orders for medications and dosing details.  Imaging Guidance (Non-Spinal):          Type of Imaging Technique: Fluoroscopy Guidance (Non-Spinal) Indication(s): Assistance in needle guidance and placement for procedures requiring needle placement in or near specific anatomical locations not easily accessible without such assistance. Exposure Time: Please see nurses notes. Contrast: None used. Fluoroscopic Guidance: I was personally present during the use of fluoroscopy. "Tunnel Vision Technique" used to obtain the best possible view of the target area. Parallax error corrected before commencing the procedure. "Direction-depth-direction" technique used to introduce the needle under continuous pulsed fluoroscopy. Once target was reached, antero-posterior, oblique, and lateral fluoroscopic projection used confirm needle placement in all planes. Images permanently stored in EMR. Interpretation: No contrast injected. I personally interpreted the imaging intraoperatively. Adequate needle placement confirmed in multiple planes. Permanent images saved into the patient's record.  Antibiotic Prophylaxis:   Anti-infectives  (From admission, onward)   None     Indication(s): None identified  Post-operative Assessment:  Post-procedure Vital Signs:  Pulse/HCG Rate: 71(!) 58 Temp: 97.9 F (36.6 C) Resp: 14 BP: 131/80 SpO2: 96 %  EBL: None  Complications: No immediate post-treatment complications observed by team, or reported by patient.  Note: The patient tolerated the entire procedure well. A repeat set of vitals were taken after the procedure and the patient was kept under observation following institutional policy, for this type of procedure. Post-procedural neurological assessment was performed, showing return to baseline, prior to discharge. The patient was provided with post-procedure discharge instructions, including a section on how to identify potential problems. Should any problems arise concerning this procedure, the patient was given instructions to immediately contact us, at any time, without hesitation. In any case, we plan to contact the patient by telephone for a follow-up status report regarding this interventional procedure.  Comments:  No additional relevant information.  Plan of Care  Orders:  Orders Placed This Encounter  Procedures   Misc procedure    Scheduling Instructions:     Type of Block: Right sided Therapeutic  Superficial Peroneal (Fibular) &  Saphenous  Nerve block     Side: Right-sided     Sedation: With Sedation.     Timeframe: Today   DG PA[facility] C-ARM 1-60 MIN NO REPORT    Intraoperative interpretation by procedural physician at [place].    Standing Status:   Standing    Number of Occurrences:   1    Order Specific Question:   Reason for exam:    Answer:   Assistance in needle guidance and placement for procedures requiring needle placement in or near specific anatomical locations not easily accessible without such assistance.   Provider attestation of informed consent for procedure/surgical case    I, the ordering provider, attest that I have  discussed with the patient the benefits, risks, side effects, alternatives, likelihood of achieving goals and potential problems during recovery for the procedure that  I have provided informed consent.    Standing Status:   Standing    Number of Occurrences:   1   Chronic Opioid Analgesic:  Hydrocodone/APAP 5/325 1 tablet every 6 hours (20 mg/day of hydrocodone) MME/day: 20 mg/day.   Medications ordered for procedure: Meds ordered this encounter  Medications   lidocaine (XYLOCAINE) 2 % (with pres) injection 400 mg   lactated ringers infusion 1,000 mL   midazolam (VERSED) 5 MG/5ML injection 1-2 mg    Make sure Flumazenil is available in the pyxis when using this medication. If oversedation occurs, administer 0.2 mg IV over 15 sec. If after 45 sec no response, administer 0.2 mg again over 1 min; may repeat at 1 min intervals; not to exceed 4 doses (1 mg)   fentaNYL (SUBLIMAZE) injection 25-50 mcg    Make sure Narcan is available in the pyxis when using this medication. In the event of respiratory depression (RR< 8/min): Titrate NARCAN (naloxone) in increments of 0.1 to 0.2 mg IV at 2-3 minute intervals, until desired degree of reversal.   triamcinolone acetonide (KENALOG-40) injection 40 mg   dexamethasone (DECADRON) injection 10 mg   ropivacaine (PF) 2 mg/mL (0.2%) (NAROPIN) injection 8 mL   Medications administered: We administered lidocaine, lactated ringers, midazolam, fentaNYL, triamcinolone acetonide, dexamethasone, and ropivacaine (PF) 2 mg/mL (0.2%).  See the medical record for exact dosing, route, and time of administration.  Follow-up plan:   Return for (VV), 2 wk PP-F/U Eval.       Interventional management options: Considering:   Palliative/diagnostic right-sidedCommon Peroneal nerve block#3 Palliative right-sided common peroneal nerve RFA #2(last done on 06/21/2017) Diagnostic right ankle block Diagnosticright lumbar sympathetic block Diagnostic  midlinethoracic epidural steroid injection Diagnostic bilateral thoracic facet block Possible bilateral thoracic facet RFA Diagnostic cervical epidural steroid injection Diagnostic bilateral cervical facet block#3 Possible bilateral cervical facet RFA   Palliative PRN treatment(s):   Palliative left sided cervical epidural steroid injection #4 Palliative right-sided common peroneal nerve blocks Palliative right-sided common peroneal nerve RFA #2(last done on 06/21/2017)     Recent Visits Date Type Provider Dept  07/10/18 Office Visit Milinda Pointer, MD [facility]  05/23/18 Office Visit Milinda Pointer, MD [facility]  Showing recent visits within past 90 days and meeting all other requirements   Today's Visits Date Type Provider Dept  07/18/18 Procedure visit Milinda Pointer, MD [facility]  Showing today's visits and meeting all other requirements   Future Appointments Date Type Provider Dept  08/08/18 Appointment Milinda Pointer, MD [facility]  Showing future appointments within next 90 days and meeting all other requirements   Disposition: Discharge home  Discharge Date & Time: 07/18/2018; 1020 hrs.   Primary Care Physician: Virginia Crews, MD Location: [facility] Note by: Gaspar Cola, MD Date: 07/18/2018; Time: 10:35 AM  Disclaimer:  Medicine is not an exact science. The only guarantee in medicine is that nothing is guaranteed. It is important to note that the decision to proceed with this intervention was based on the information collected from the patient. The Data and conclusions were drawn from the patient's questionnaire, the interview, and the physical examination. Because the information was provided in large part by the patient, it cannot be guaranteed that it has not been purposely or unconsciously manipulated. Every effort has been made to obtain as much  relevant data as possible for this evaluation. It is important to note that the conclusions that lead to this procedure  are derived in large part from the available data. Always take into account that the treatment will also be dependent on availability of resources and existing treatment guidelines, considered by other Pain Management Practitioners as being common knowledge and practice, at the time of the intervention. For Medico-Legal purposes, it is also important to point out that variation in procedural techniques and pharmacological choices are the acceptable norm. The indications, contraindications, technique, and results of the above procedure should only be interpreted and judged by a Board-Certified Interventional Pain Specialist with extensive familiarity and expertise in the same exact procedure and technique.

## 2018-07-18 NOTE — Progress Notes (Signed)
Safety precautions to be maintained throughout the outpatient stay will include: orient to surroundings, keep bed in low position, maintain call bell within reach at all times, provide assistance with transfer out of bed and ambulation.  

## 2018-07-18 NOTE — Patient Instructions (Signed)

## 2018-07-19 ENCOUNTER — Telehealth: Payer: Self-pay | Admitting: *Deleted

## 2018-07-19 NOTE — Telephone Encounter (Signed)
No problems post procedure. 

## 2018-07-25 ENCOUNTER — Other Ambulatory Visit: Payer: Self-pay | Admitting: Family Medicine

## 2018-07-26 NOTE — Telephone Encounter (Signed)
L.O.V. was 06/15/2018, please advise.

## 2018-08-01 ENCOUNTER — Other Ambulatory Visit: Payer: Self-pay | Admitting: Pulmonary Disease

## 2018-08-01 DIAGNOSIS — Z79899 Other long term (current) drug therapy: Secondary | ICD-10-CM | POA: Diagnosis not present

## 2018-08-07 ENCOUNTER — Encounter: Payer: Self-pay | Admitting: Pain Medicine

## 2018-08-08 ENCOUNTER — Ambulatory Visit: Payer: Medicaid Other | Attending: Pain Medicine | Admitting: Pain Medicine

## 2018-08-08 ENCOUNTER — Other Ambulatory Visit: Payer: Self-pay

## 2018-08-08 DIAGNOSIS — M25571 Pain in right ankle and joints of right foot: Secondary | ICD-10-CM | POA: Diagnosis not present

## 2018-08-08 DIAGNOSIS — G8929 Other chronic pain: Secondary | ICD-10-CM | POA: Diagnosis not present

## 2018-08-08 DIAGNOSIS — M546 Pain in thoracic spine: Secondary | ICD-10-CM | POA: Diagnosis not present

## 2018-08-08 DIAGNOSIS — M79671 Pain in right foot: Secondary | ICD-10-CM | POA: Diagnosis not present

## 2018-08-08 DIAGNOSIS — S8411XS Injury of peroneal nerve at lower leg level, right leg, sequela: Secondary | ICD-10-CM | POA: Diagnosis not present

## 2018-08-08 DIAGNOSIS — M792 Neuralgia and neuritis, unspecified: Secondary | ICD-10-CM

## 2018-08-08 DIAGNOSIS — G894 Chronic pain syndrome: Secondary | ICD-10-CM | POA: Diagnosis not present

## 2018-08-08 DIAGNOSIS — G4733 Obstructive sleep apnea (adult) (pediatric): Secondary | ICD-10-CM | POA: Diagnosis not present

## 2018-08-08 NOTE — Progress Notes (Signed)
Pain Management Virtual Encounter Note - Virtual Visit via Telephone Telehealth (real-time audio visits between healthcare provider and patient).   Patient's Phone No. & Preferred Pharmacy:  [phone] (home); [phone] (mobile); (Preferred) [phone] [email or web address]  [id number] Lorina Rabon, [facility] [place] [address] [place] [address] Phone: [phone] Fax: [phone]    Pre-screening note:  Our staff contacted Mr. Nickolson and offered him an "in person", "face-to-face" appointment versus a telephone encounter. He indicated preferring the telephone encounter, at this time.   Reason for Virtual Visit: COVID-19*  Social distancing based on CDC and AMA recommendations.   I contacted [name] on 08/08/2018 via telephone.      I clearly identified myself as Gaspar Cola, MD. I verified that I was speaking with the correct person using two identifiers (Name: Caleb [name], and date of birth: 01-22-1955).  Advanced Informed Consent I sought verbal advanced consent from [name] for virtual visit interactions. I informed Mr. Ortega of possible security and privacy concerns, risks, and limitations associated with providing "not-in-person" medical evaluation and management services. I also informed Mr. Tobon of the availability of "in-person" appointments. Finally, I informed him that there would be a charge for the virtual visit and that he could be  personally, fully or partially, financially responsible for it. Mr. Lofaro expressed understanding and agreed to proceed.   Historic Elements   Mr. REACE BRESHEARS is a 63 y.o. year old, male patient evaluated today after his last encounter by our practice on 07/19/2018. Mr. Glotfelty  has a past medical history of Chronic foot pain, right (2015), COPD (chronic obstructive pulmonary disease) ([place]), Coronary artery disease, Emphysema lung ([place]), Hyperlipidemia, Hypertension, Leucocytosis,  Myocardial infarction ([place]) (2015), and OSA on CPAP. He also  has a past surgical history that includes Cardiac catheterization (2015); Lumbar laminectomy (1989, 1999); Cervical fusion (1988, 1998); Liver surgery (2015); Partial colectomy (1990); Inguinal hernia repair (Bilateral, 1975); Lithotripsy; and Colonoscopy with propofol (N/A, 02/24/2017). Mr. Reisig has a current medication list which includes the following prescription(s): aspirin ec, cetirizine, fluticasone, isosorbide mononitrate, losartan, methocarbamol, neomycin-polymyxin-hydrocortisone, nitroglycerin, rosuvastatin, sertraline, umeclidinium-vilanterol, varenicline, oxycodone, pregabalin, pregabalin, and pregabalin. He  reports that he has been smoking cigarettes. He has a 24.50 pack-year smoking history. He has never used smokeless tobacco. He reports current alcohol use of about 4.0 standard drinks of alcohol per week. He reports that he does not use drugs. Mr. Sear is allergic to lisinopril.   HPI  Today, he is being contacted for both, medication management and a post-procedure assessment.  The patient did well for approximately 5 days after the procedure.  Unfortunately, the pain is now returning.  We have confirmed that he is radiofrequency done on 10/25/2017 has worn off and he probably needs to have it repeated.  However, before we repeat the radiofrequency we have decided to make some changes in terms of his medicines and we will discontinue the hydrocodone/APAP in order to eliminate the acetaminophen and we will substituted with oxycodone IR 5 mg every 6.  In addition the patient indicates tolerating well the Lyrica 50 mg p.o. 3 times daily and therefore we will continue to increase it.  We will first go to 75 mg p.o. 3 times daily, followed by 100 mg p.o. 3 times daily, followed by 150 mg p.o. 3 times daily, as tolerated.  Since I am making this changes to his medications, I will not be writing for multiple prescriptions until I can  confirm  that he tolerates these changes.  However, I did call his pharmacy at [phone] and I had them delete all of the prior hydrocodone prescriptions as well as the prior Lyrica prescription.  If by any chance the changes on the medications do not help, then we will again consider repeating the right-sided superficial peroneal RFA.  Post-Procedure Evaluation  Procedure: Diagnostic  right-sided superficial Peroneal/Fibular (S-PN) NB #1 s/p RFA on 10/25/2017 Pre-procedure pain level:  4/10 Post-procedure: 2/10 No initial benefit, possibly due to rapid discharge after no sedation procedure, without enough time to allow full onset of block.  Sedation: Sedation provided.  Effectiveness during initial hour after procedure(Ultra-Short Term Relief): 100 %   Local anesthetic used: Long-acting (4-6 hours) Effectiveness: Defined as any analgesic benefit obtained secondary to the administration of local anesthetics. This carries significant diagnostic value as to the etiological location, or anatomical origin, of the pain. Duration of benefit is expected to coincide with the duration of the local anesthetic used.  Effectiveness during initial 4-6 hours after procedure(Short-Term Relief): 100 %   Long-term benefit: Defined as any relief past the pharmacologic duration of the local anesthetics.  Effectiveness past the initial 6 hours after procedure(Long-Term Relief): 50 % (for about 5 days then it slowly returned)   Current benefits: Defined as benefit that persist at this time.   Analgesia:  Back to baseline Function: Back to baseline ROM: Back to baseline  Pharmacotherapy Assessment  Analgesic: Hydrocodone/APAP 5/325 1 tablet every 6 hours (20 mg/day of hydrocodone).  Today I called the pharmacy and canceled all of his hydrocodone/APAP prescriptions and I will be switching him to oxycodone IR 5 mg, 1 tab p.o. every 6 hours (20 mg/day of oxycodone) MME/day: 20 mg/day increased now to 30 mg/day  with the use of the oxycodone.   Monitoring: Pharmacotherapy: No side-effects or adverse reactions reported. [place] PMP: PDMP reviewed during this encounter.       Compliance: No problems identified. Effectiveness: Clinically acceptable. Plan: Refer to "POC".  UDS:  Summary  Date Value Ref Range Status  12/21/2017 FINAL  Final    Comment:    ==================================================================== TOXASSURE SELECT 13 (MW) ==================================================================== Test                             Result       Flag       Units Drug Present and Declared for Prescription Verification   Oxycodone                      276          EXPECTED   ng/mg creat   Oxymorphone                    1086         EXPECTED   ng/mg creat   Noroxycodone                   1214         EXPECTED   ng/mg creat   Noroxymorphone                 397          EXPECTED   ng/mg creat    Sources of oxycodone are scheduled prescription medications.    Oxymorphone, noroxycodone, and noroxymorphone are expected    metabolites of oxycodone. Oxymorphone is also available as a  scheduled prescription medication. Drug Absent but Declared for Prescription Verification   Hydrocodone                    Not Detected UNEXPECTED ng/mg creat ==================================================================== Test                      Result    Flag   Units      Ref Range   Creatinine              96               mg/dL      >=20 ==================================================================== Declared Medications:  The flagging and interpretation on this report are based on the  following declared medications.  Unexpected results may arise from  inaccuracies in the declared medications.  **Note: The testing scope of this panel includes these medications:  Hydrocodone (Norco)  Oxycodone  **Note: The testing scope of this panel does not include following  reported medications:   Acetaminophen (Norco)  Albuterol  Aspirin (Aspirin 81)  Fluticasone (Flovent)  Hydrochlorothiazide (Hydrodiuril)  Isosorbide (Imdur)  Losartan (Cozaar)  Methocarbamol (Robaxin)  Metoprolol (Lopressor)  Nitroglycerin (Nitrostat)  Pregabalin (Lyrica)  Rosuvastatin (Crestor)  Secukinumab  Tiotropium (Spiriva)  Triamcinolone (Kenalog) ==================================================================== For clinical consultation, please call [phone]. ====================================================================    Laboratory Chemistry Profile (12 mo)  Renal: 03/15/2018: BUN/Creatinine Ratio 23 05/30/2018: BUN 28; Creatinine, Ser 0.70; GFR calc Af Amer >60; GFR calc non Af Amer >60  Hepatic: 02/09/2018: Albumin 3.6; ALT 20; AST 21 Other: 11/07/2017: CRP 8; Sed Rate 26 Note: Above Lab results reviewed.  Imaging  Last 90 days:  Dg Pain Clinic C-arm 1-60 Min No Report  Result Date: 07/18/2018 Fluoro was used, but no Radiologist interpretation will be provided. Please refer to "NOTES" tab for provider progress note.  Last Hospital Admission:  Dg Pain Clinic C-arm 1-60 Min No Report  Result Date: 07/18/2018 Fluoro was used, but no Radiologist interpretation will be provided. Please refer to "NOTES" tab for provider progress note.  Assessment  The primary encounter diagnosis was Chronic pain syndrome. 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  Plan of Care  I have discontinued Ferlando L. Jerome's Chantix Starting Month Pak, HYDROcodone-acetaminophen, pregabalin, HYDROcodone-acetaminophen, and HYDROcodone-acetaminophen. I am also having him start on pregabalin, pregabalin, pregabalin, and oxyCODONE. Additionally, I am  having him maintain his aspirin EC, nitroGLYCERIN, rosuvastatin, losartan, sertraline, cetirizine, umeclidinium-vilanterol, neomycin-polymyxin-hydrocortisone, varenicline, fluticasone, methocarbamol, and isosorbide mononitrate.  Pharmacotherapy (Medications Ordered): Meds ordered this encounter  Medications  . pregabalin (LYRICA) 75 MG capsule    Sig: Take 1 capsule (75 mg total) by mouth 3 (three) times daily for 14 days.    Dispense:  42 capsule    Refill:  0    Fill one day early if pharmacy is closed on scheduled refill date. May substitute for generic if available.  . pregabalin (LYRICA) 100 MG capsule    Sig: Take 1 capsule (100 mg total) by mouth 3 (three) times daily for 14 days.    Dispense:  42 capsule    Refill:  0    Fill one day early if pharmacy is closed on scheduled refill date. May substitute for generic if available.  . pregabalin (  LYRICA) 150 MG capsule    Sig: Take 1 capsule (150 mg total) by mouth 2 (two) times daily for 12 days.    Dispense:  24 capsule    Refill:  0    Fill one day early if pharmacy is closed on scheduled refill date. May substitute for generic if available.  Marland Kitchen oxyCODONE (OXY IR/ROXICODONE) 5 MG immediate release tablet    Sig: Take 1 tablet (5 mg total) by mouth every 6 (six) hours as needed for severe pain. Must last 30 days.    Dispense:  120 tablet    Refill:  0    Chronic Pain. (STOP Act - Not applicable). Fill one day early if closed on scheduled refill date. Do not fill until: 08/19/2018. To last until: 09/18/2018   Orders:  No orders of the defined types were placed in this encounter.  Follow-up plan:   Return in about 6 weeks (around 09/18/2018) for (VV), E/M (MM) to eval hydrocodone to oxycodone chnge & Lyrica titration.      Interventional management options: Considering:   Possible bilateral cervical facet RFA Diagnostic right ankle block Diagnosticright lumbar sympathetic block Diagnostic midlineTESI Diagnostic bilateral  thoracic facet block Possible bilateral thoracic facet RFA   Palliative PRN treatment(s):   Palliative left CESI #4 Diagnostic bilateral cervical facet block#3 Palliative/diagnostic right-sidedCommon Peroneal NB#3 Palliative right-sided common peroneal nerve RFA #2(last done on 06/21/2017)    Recent Visits Date Type Provider Dept  08/08/18 Office Visit Milinda Pointer, MD [facility]  07/18/18 Procedure visit Milinda Pointer, MD [facility]  07/10/18 Office Visit Milinda Pointer, MD [facility]  05/23/18 Office Visit Milinda Pointer, MD [facility]  Showing recent visits within past 90 days and meeting all other requirements   Future Appointments No visits were found meeting these conditions.  Showing future appointments within next 90 days and meeting all other requirements   I discussed the assessment and treatment plan with the patient. The patient was provided an opportunity to ask questions and all were answered. The patient agreed with the plan and demonstrated an understanding of the instructions.  Patient advised to call back or seek an in-person evaluation if the symptoms or condition worsens.  Total duration of non-face-to-face encounter: 20 minutes.  Note by: Gaspar Cola, MD Date: 08/08/2018; Time: 6:11 PM  Note: This dictation was prepared with Dragon dictation. Any transcriptional errors that may result from this process are unintentional.  Disclaimer:  * Given the special circumstances of the COVID-19 pandemic, the federal government has announced that the Office for Civil Rights (OCR) will exercise its enforcement discretion and will not impose penalties on physicians using telehealth in the event of noncompliance with regulatory requirements under the [place] (HIPAA) in connection with the good faith provision of telehealth during the QJJHE-17 national  public health emergency. (Luis Llorens Torres)

## 2018-08-09 MED ORDER — PREGABALIN 150 MG PO CAPS: 150.0000 mg | ORAL_CAPSULE | Freq: Two times a day (BID) | ORAL | 0 refills | Status: DC

## 2018-08-09 MED ORDER — PREGABALIN 100 MG PO CAPS: 100.0000 mg | ORAL_CAPSULE | Freq: Three times a day (TID) | ORAL | 0 refills | Status: DC

## 2018-08-09 MED ORDER — OXYCODONE HCL 5 MG PO TABS: 5.0000 mg | ORAL_TABLET | Freq: Four times a day (QID) | ORAL | 0 refills | Status: DC | PRN

## 2018-08-09 MED ORDER — PREGABALIN 75 MG PO CAPS: 75.0000 mg | ORAL_CAPSULE | Freq: Three times a day (TID) | ORAL | 0 refills | Status: DC

## 2018-08-16 ENCOUNTER — Encounter: Payer: Medicaid Other | Admitting: Pain Medicine

## 2018-08-21 ENCOUNTER — Telehealth: Payer: Self-pay | Admitting: Family Medicine

## 2018-08-21 MED ORDER — NALOXONE HCL 4 MG/0.1ML NA LIQD: NASAL | 0 refills | Status: DC

## 2018-08-21 MED ORDER — NITROGLYCERIN 0.4 MG SL SUBL: 0.4000 mg | SUBLINGUAL_TABLET | SUBLINGUAL | 3 refills | Status: DC | PRN

## 2018-08-21 NOTE — Telephone Encounter (Signed)
Rx for both sent to pharmacy

## 2018-08-21 NOTE — Telephone Encounter (Signed)
°  Janelle Floor Battles Case manager for [facility].  Saying pt is needing two prescriptions:  nitroGLYCERIN (NITROSTAT) 0.4 MG SL tablet has expired and needing this refilled.  Needing to be prescribed Narcan becase pt can overdose taking an opiod and muscle relaxer.  Please advise.  Thanks, [employer]

## 2018-08-21 NOTE — Telephone Encounter (Signed)
Please review. Ok to fill?

## 2018-08-28 ENCOUNTER — Telehealth: Payer: Self-pay | Admitting: Pain Medicine

## 2018-08-28 NOTE — Telephone Encounter (Signed)
Spoke with patient.  States he took Oxycodone Saturday morning and felt nauseated, stomach ache and headache.  He stopped taking it and started taking left over pills from a previous Hydrocodone prescription.  He took the last Hydrocodone this morning.  States he does not want to continue taking the Oxycodone because it made him feel so bad, although he states he did have a stomach ache this morning but no nausea or headache.  Informed patient that you were on vacation but I would send you an inbox message.

## 2018-08-28 NOTE — Telephone Encounter (Signed)
Patient lvmail stating he started Oxycodone last Saturday Aug 15th. Was sick at his stomach the entire time he was taking. He stopped Oxy and took some Hydrocodone he had left. Now has no meds he can take. Please call to discuss options.

## 2018-08-29 ENCOUNTER — Telehealth: Payer: Self-pay

## 2018-08-29 NOTE — Telephone Encounter (Signed)
Patient called again inquiring about medications. Called him and LM stating that we had sent a message to Dr Dossie Arbour and have not heard back as of yet.

## 2018-08-30 ENCOUNTER — Emergency Department
Admission: EM | Admit: 2018-08-30 | Discharge: 2018-08-30 | Disposition: A | Discharge status: Home / Self Care | Payer: Medicaid Other | Attending: Student in an Organized Health Care Education/Training Program | Admitting: Student in an Organized Health Care Education/Training Program

## 2018-08-30 ENCOUNTER — Encounter: Payer: Self-pay | Admitting: Emergency Medicine

## 2018-08-30 ENCOUNTER — Other Ambulatory Visit: Payer: Self-pay

## 2018-08-30 DIAGNOSIS — M79671 Pain in right foot: Secondary | ICD-10-CM | POA: Diagnosis not present

## 2018-08-30 DIAGNOSIS — G8929 Other chronic pain: Secondary | ICD-10-CM

## 2018-08-30 DIAGNOSIS — J449 Chronic obstructive pulmonary disease, unspecified: Secondary | ICD-10-CM | POA: Diagnosis not present

## 2018-08-30 DIAGNOSIS — F17210 Nicotine dependence, cigarettes, uncomplicated: Secondary | ICD-10-CM | POA: Insufficient documentation

## 2018-08-30 DIAGNOSIS — I10 Essential (primary) hypertension: Secondary | ICD-10-CM | POA: Diagnosis not present

## 2018-08-30 DIAGNOSIS — Z7982 Long term (current) use of aspirin: Secondary | ICD-10-CM | POA: Insufficient documentation

## 2018-08-30 LAB — CBC WITH DIFFERENTIAL/PLATELET
Abs Immature Granulocytes: 0.08 10*3/uL — ABNORMAL HIGH (ref 0.00–0.07)
Basophils Absolute: 0 10*3/uL (ref 0.0–0.1)
Basophils Relative: 0 %
Eosinophils Absolute: 0.1 10*3/uL (ref 0.0–0.5)
Eosinophils Relative: 1 %
HCT: 44 % (ref 39.0–52.0)
Hemoglobin: 14.8 g/dL (ref 13.0–17.0)
Immature Granulocytes: 1 %
Lymphocytes Relative: 18 %
Lymphs Abs: 2.4 10*3/uL (ref 0.7–4.0)
MCH: 30.7 pg (ref 26.0–34.0)
MCHC: 33.6 g/dL (ref 30.0–36.0)
MCV: 91.3 fL (ref 80.0–100.0)
Monocytes Absolute: 1.1 10*3/uL — ABNORMAL HIGH (ref 0.1–1.0)
Monocytes Relative: 9 %
Neutro Abs: 9.7 10*3/uL — ABNORMAL HIGH (ref 1.7–7.7)
Neutrophils Relative %: 71 %
Platelets: 249 10*3/uL (ref 150–400)
RBC: 4.82 MIL/uL (ref 4.22–5.81)
RDW: 12.5 % (ref 11.5–15.5)
WBC: 13.5 10*3/uL — ABNORMAL HIGH (ref 4.0–10.5)
nRBC: 0 % (ref 0.0–0.2)

## 2018-08-30 LAB — COMPREHENSIVE METABOLIC PANEL
ALT: 23 U/L (ref 0–44)
AST: 22 U/L (ref 15–41)
Albumin: 3.8 g/dL (ref 3.5–5.0)
Alkaline Phosphatase: 71 U/L (ref 38–126)
Anion gap: 8 (ref 5–15)
BUN: 22 mg/dL — ABNORMAL HIGH (ref 6–20)
CO2: 27 mmol/L (ref 22–32)
Calcium: 9 mg/dL (ref 8.9–10.3)
Chloride: 105 mmol/L (ref 98–111)
Creatinine, Ser: 0.78 mg/dL (ref 0.61–1.24)
GFR calc Af Amer: >60 mL/min (ref >60)
GFR calc non Af Amer: >60 mL/min (ref >60)
Glucose, Bld: 114 mg/dL — ABNORMAL HIGH (ref 70–99)
Potassium: 4 mmol/L (ref 3.5–5.1)
Sodium: 140 mmol/L (ref 135–145)
Total Bilirubin: 0.6 mg/dL (ref 0.3–1.2)
Total Protein: 7.3 g/dL (ref 6.5–8.1)

## 2018-08-30 MED ORDER — HYDROCODONE-ACETAMINOPHEN 5-325 MG PO TABS: 1.0000 | ORAL_TABLET | Freq: Four times a day (QID) | ORAL | 0 refills | Status: DC | PRN

## 2018-08-30 MED ORDER — LIDOCAINE 5 % EX PTCH
1.0000 | MEDICATED_PATCH | Freq: Once | CUTANEOUS | Status: DC
  Administered 2018-08-30: 1 via TRANSDERMAL
  Filled 2018-08-30: qty 1

## 2018-08-30 MED ORDER — LIDOCAINE 5 % EX PTCH: 1.0000 | MEDICATED_PATCH | Freq: Two times a day (BID) | CUTANEOUS | 0 refills | Status: DC

## 2018-08-30 NOTE — ED Notes (Signed)
See triage note  States he is having chronic foot and neck pain  States he goes to pain clinic and was instructed to come here

## 2018-08-30 NOTE — ED Triage Notes (Signed)
Patient presents to the ED with chronic foot and neck pain.  Patient goes to the pain clinic and states his doctor is out of town.  Patient states his doctor recently wrote him a prescription for percocet but they upset his stomach so he took old hydrocodone which helped him but now he has run out.  This RN notified patient that the ED does not generally write prescriptions for narcotic pain medication for chronic pain.  Patient reports the nurses at the pain clinic told him to come to the ED.
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
## 2018-08-30 NOTE — Discharge Instructions (Addendum)
Call the pain clinic on Monday.  Continue using the Lidoderm patch and the hydrocodone only as directed.

## 2018-08-30 NOTE — ED Notes (Signed)
Pt verbalizes d/c understanding and follow up. Pt in NAD, pt unable to sign d/c due to computer malfnx with signature pad

## 2018-09-04 ENCOUNTER — Telehealth: Payer: Self-pay | Admitting: *Deleted

## 2018-09-04 ENCOUNTER — Other Ambulatory Visit: Payer: Self-pay

## 2018-09-04 NOTE — Telephone Encounter (Signed)
*  STAT* If patient is at the pharmacy, call can be transferred to refill team.   1. Which medications need to be refilled? (please list name of each medication and dose if known) LOSARTAN  2. Which pharmacy/location (including street and city if local pharmacy) is medication to be sent to? [facility]  3. Do they need a 30 day or 90 day supply? [place] SINCE LAST THURSDAY

## 2018-09-04 NOTE — Telephone Encounter (Signed)
LMOM to call back regarding the Losartan refill.

## 2018-09-05 ENCOUNTER — Telehealth: Payer: Self-pay | Admitting: Family Medicine

## 2018-09-05 MED ORDER — LOSARTAN POTASSIUM 100 MG PO TABS: 100.0000 mg | ORAL_TABLET | Freq: Every day | ORAL | 1 refills | Status: DC

## 2018-09-05 NOTE — Telephone Encounter (Signed)
Patient needs to make an appointment with Dr Dossie Arbour for virtual visit if he is wanting a medication change.

## 2018-09-05 NOTE — Telephone Encounter (Signed)
Pt needs a refill on Losartan 100 mg   [facility]

## 2018-09-06 ENCOUNTER — Telehealth: Payer: Self-pay | Admitting: Family Medicine

## 2018-09-06 NOTE — Telephone Encounter (Signed)
Gave patient appt. For 09-20-18 at 7:45. This is the soonest available. Patient is extremely upset as he cannot take the new medicine. And has nothing else to help with his pain.

## 2018-09-06 NOTE — Telephone Encounter (Signed)
Patient states that the Oxycodone had him sick and he could not take the medication anymore. Patient states that he tried to reach out to his pain management doctor and he is out of the office. Patient states he is out of medication and needs something to get him to his 09/20/2018.Please advise.

## 2018-09-06 NOTE — Telephone Encounter (Signed)
Pt needing to discuss situation with his pain doctor and not able to get his medication.  Please call pt back.  Thanks, [employer]

## 2018-09-06 NOTE — Telephone Encounter (Signed)
Unfortunately, all pain medications will need to go through the pain management clinic as per his pain contract with them.  According to previous notes, seems that Dr Consuela Mimes may now be back from vacation.  He should give their office another call to discuss.

## 2018-09-06 NOTE — Telephone Encounter (Signed)
Patient advised as below. Patient verbalizes understanding and is in agreement with treatment plan.  

## 2018-09-18 ENCOUNTER — Encounter: Payer: Self-pay | Admitting: Pain Medicine

## 2018-09-19 NOTE — Progress Notes (Signed)
Pain Management Virtual Encounter Note - Virtual Visit via Telephone Telehealth (real-time audio visits between healthcare provider and patient).   Patient's Phone No. & Preferred Pharmacy:  [phone] (home); [phone] (mobile); (Preferred) [phone] [email or web address]  [id number] Lorina Rabon, [facility] [place] [address] [place] [address] Phone: [phone] Fax: [phone]    Pre-screening note:  Our staff contacted Caleb [name] and offered him an "in person", "face-to-face" appointment versus a telephone encounter. He indicated preferring the telephone encounter, at this time.   Reason for Virtual Visit: COVID-19*  Social distancing based on CDC and AMA recommendations.   I contacted Caleb [name] on 09/20/2018 via telephone.      I clearly identified myself as Gaspar Cola, MD. I verified that I was speaking with the correct person using two identifiers (Name: Caleb [name], and date of birth: 1955/06/03).  Advanced Informed Consent I sought verbal advanced consent from Caleb [name] for virtual visit interactions. I informed Caleb [name] of possible security and privacy concerns, risks, and limitations associated with providing "not-in-person" medical evaluation and management services. I also informed Caleb [name] of the availability of "in-person" appointments. Finally, I informed him that there would be a charge for the virtual visit and that he could be  personally, fully or partially, financially responsible for it. Caleb [name] expressed understanding and agreed to proceed.   Historic Elements   Caleb [name] is a 63 y.o. year old, male patient evaluated today after his last encounter by our practice on 09/04/2018. Caleb [name]  has a past medical history of Chronic foot pain, right (2015), COPD (chronic obstructive pulmonary disease) ([place]), Coronary artery disease, Emphysema lung ([place]), Hyperlipidemia, Hypertension, Leucocytosis,  Myocardial infarction ([place]) (2015), and OSA on CPAP. He also  has a past surgical history that includes Cardiac catheterization (2015); Lumbar laminectomy (1989, 1999); Cervical fusion (1988, 1998); Liver surgery (2015); Partial colectomy (1990); Inguinal hernia repair (Bilateral, 1975); Lithotripsy; and Colonoscopy with propofol (N/A, 02/24/2017). Caleb [name] has a current medication list which includes the following prescription(s): aspirin ec, cetirizine, fluticasone, hydrocodone-acetaminophen, hydrocodone-acetaminophen, hydrocodone-acetaminophen, isosorbide mononitrate, lidocaine, losartan, methocarbamol, naloxone, neomycin-polymyxin-hydrocortisone, nitroglycerin, pregabalin, rosuvastatin, secukinumab, sertraline, umeclidinium-vilanterol, varenicline, and mometasone. He  reports that he has been smoking cigarettes. He has a 24.50 pack-year smoking history. He has never used smokeless tobacco. He reports current alcohol use of about 4.0 standard drinks of alcohol per week. He reports that he does not use drugs. Caleb [name] is allergic to lisinopril and oxycodone.   HPI  Today, he is being contacted for medication management.  Today's encounter was initiated to discuss a medication problem.  On the last visit we switched the patient from hydrocodone/APAP 5/325 1 tablet p.o. every 6 hours to oxycodone IR 5 mg 1 tablet p.o. every 6 hours.  This carried an increase in the MME/day from 20 mg/day to 30 mg/day.  In addition, we began to titrate the Lyrica up with a goal of either achieving better relief of the pain or getting to a maximum of 450 mg/day.  The patient was instructed to call us if he was having any issues with the medications, which apparently he is.  In reviewing the patient's PMP I have identified that all 3 prescriptions (75, 100, and 150 mg) of the pregabalin (Lyrica) where dispensed to the patient on the same day disregarding my instructions to dispense them 2 weeks apart, in a particular order.  In  addition, I have identified that the patient has received  a prescription for hydrocodone/APAP from Alinda Dooms, [place], after he had my oxycodone IR prescription filled.  According to the patient, he was unable to tolerate the oxycodone.  He indicates that it caused a lot of nausea and it kept him very sleepy all the time despite the fact that he attempted to go down to a every 8 hour regimen.  Since the patient was unable to tolerate the oxycodone, we will have him return all of the remaining pills so that they can be counted and destroyed in front of witnesses.  Today I will go ahead and send a refill to his pharmacy on his hydrocodone.  Since we have realized that he has been on that hydrocodone 5/325 for quite some time and he has developed tolerance to it, today I have instructed him to taper it down and go through a 14-day drug holiday after which he can restart it and hopefully that will reset his receptors so asked to have it work like it used to.  Since we had also increased the Lyrica and part of his complaints are that he is sleepy all the time, knowing that this is a common side effect to the Lyrica I have questioned him about the Lyrica causing that.  He indicates that he had no problems with the Lyrica and this seems to be working well at 150 mg 3 times daily.  He indicates knowing that it was not the Lyrica because he stopped taking the oxycodone IR, secondary to the side effects, and while he was off of it, he stopped having the stomach ache, nausea, headaches, and somnolence.  During that same time he was still on the Lyrica 150 mg p.o. 3 times daily and he noticed that the Lyrica was not causing any of it.  Because of this, we will renew the Lyrica and we will go back to the hydrocodone  Pharmacotherapy Assessment  Analgesic: Hydrocodone/APAP 5/325 1 tablet every 6 hours (20 mg/day of hydrocodone).  (Unable to tolerate an oxycodone IR trial due to stomachaches, nausea, headaches, and  excessive somnolence.) MME/day: 20 mg/day.   Monitoring: Pharmacotherapy: No side-effects or adverse reactions reported. [place] PMP: PDMP reviewed during this encounter.       Compliance: No problems identified. Effectiveness: Clinically acceptable. Plan: Refer to "POC".  UDS:  Summary  Date Value Ref Range Status  12/21/2017 FINAL  Final    Comment:    ==================================================================== TOXASSURE SELECT 13 (MW) ==================================================================== Test                             Result       Flag       Units Drug Present and Declared for Prescription Verification   Oxycodone                      276          EXPECTED   ng/mg creat   Oxymorphone                    1086         EXPECTED   ng/mg creat   Noroxycodone                   1214         EXPECTED   ng/mg creat   Noroxymorphone  397          EXPECTED   ng/mg creat    Sources of oxycodone are scheduled prescription medications.    Oxymorphone, noroxycodone, and noroxymorphone are expected    metabolites of oxycodone. Oxymorphone is also available as a    scheduled prescription medication. Drug Absent but Declared for Prescription Verification   Hydrocodone                    Not Detected UNEXPECTED ng/mg creat ==================================================================== Test                      Result    Flag   Units      Ref Range   Creatinine              96               mg/dL      >=20 ==================================================================== Declared Medications:  The flagging and interpretation on this report are based on the  following declared medications.  Unexpected results may arise from  inaccuracies in the declared medications.  **Note: The testing scope of this panel includes these medications:  Hydrocodone (Norco)  Oxycodone  **Note: The testing scope of this panel does not include following  reported  medications:  Acetaminophen (Norco)  Albuterol  Aspirin (Aspirin 81)  Fluticasone (Flovent)  Hydrochlorothiazide (Hydrodiuril)  Isosorbide (Imdur)  Losartan (Cozaar)  Methocarbamol (Robaxin)  Metoprolol (Lopressor)  Nitroglycerin (Nitrostat)  Pregabalin (Lyrica)  Rosuvastatin (Crestor)  Secukinumab  Tiotropium (Spiriva)  Triamcinolone (Kenalog) ==================================================================== For clinical consultation, please call [phone]. ====================================================================    Laboratory Chemistry Profile (12 mo)  Renal: 03/15/2018: BUN/Creatinine Ratio 23 08/30/2018: BUN 22; Creatinine, Ser 0.78  Lab Results  Component Value Date   GFRAA >60 08/30/2018   GFRNONAA >60 08/30/2018   Hepatic: 08/30/2018: Albumin 3.8 Lab Results  Component Value Date   AST 22 08/30/2018   ALT 23 08/30/2018   Other: 11/07/2017: CRP 8; Sed Rate 26 Note: Above Lab results reviewed.  Imaging  Last 90 days:  Dg Swallow Func Speech Path  Result Date: 08/21/2018 Please refer to "Notes" tab for Speech Pathology notes.  Dg Pain Clinic C-arm 1-60 Min No Report  Result Date: 07/18/2018 Fluoro was used, but no Radiologist interpretation will be provided. Please refer to "NOTES" tab for provider progress note.  Assessment  The primary encounter diagnosis was Chronic pain syndrome. 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  Plan of Care  I have discontinued Caleb [name]'s pregabalin, pregabalin, and oxyCODONE. I have also changed his pregabalin and HYDROcodone-acetaminophen. Additionally, I am having him start on HYDROcodone-acetaminophen and HYDROcodone-acetaminophen. Lastly, I  am having him maintain his aspirin EC, rosuvastatin, sertraline, cetirizine, umeclidinium-vilanterol, neomycin-polymyxin-hydrocortisone, varenicline, fluticasone, methocarbamol, isosorbide mononitrate, nitroGLYCERIN, naloxone, lidocaine, losartan, mometasone, and Secukinumab (COSENTYX [place]).  Pharmacotherapy (Medications Ordered): Meds ordered this encounter  Medications  . pregabalin (LYRICA) 150 MG capsule    Sig: Take 1 capsule (150 mg total) by mouth 2 (two) times daily.    Dispense:  60 capsule    Refill:  2    Fill one day early if pharmacy is closed on scheduled refill date. May substitute for generic if available.  Marland Kitchen HYDROcodone-acetaminophen (NORCO/VICODIN) 5-325 MG  tablet    Sig: Take 1 tablet by mouth every 6 (six) hours as needed for severe pain. Must last 30 days    Dispense:  120 tablet    Refill:  0    Chronic Pain: STOP Act (Not applicable) Fill 1 day early if closed on refill date. Do not fill until: 09/20/2018. To last until: 10/20/2018. Avoid benzodiazepines within 8 hours of opioids  . HYDROcodone-acetaminophen (NORCO/VICODIN) 5-325 MG tablet    Sig: Take 1 tablet by mouth every 6 (six) hours as needed for severe pain. Must last 30 days    Dispense:  120 tablet    Refill:  0    Chronic Pain: STOP Act (Not applicable) Fill 1 day early if closed on refill date. Do not fill until: 10/20/2018. To last until: 11/19/2018. Avoid benzodiazepines within 8 hours of opioids  . HYDROcodone-acetaminophen (NORCO/VICODIN) 5-325 MG tablet    Sig: Take 1 tablet by mouth every 6 (six) hours as needed for severe pain. Must last 30 days    Dispense:  120 tablet    Refill:  0    Chronic Pain: STOP Act (Not applicable) Fill 1 day early if closed on refill date. Do not fill until: 11/19/2018. To last until: 12/19/2018. Avoid benzodiazepines within 8 hours of opioids   Orders:  No orders of the defined types were placed in this encounter.  Follow-up plan:   Return in about 3 months (around  12/18/2018) for (VV), E/M, (MM).      Interventional management options: Considering:   Possible bilateral cervical facet RFA Diagnostic right ankle block Diagnosticright lumbar sympathetic block Diagnostic midlineTESI Diagnostic bilateral thoracic facet block Possible bilateral thoracic facet RFA   Palliative PRN treatment(s):   Palliative left CESI #4 Diagnostic bilateral cervical facet block#3 Palliative/diagnostic right-sidedCommon Peroneal NB#3 Palliative right-sided common peroneal nerve RFA #2(last done on 06/21/2017)    Recent Visits Date Type Provider Dept  08/08/18 Office Visit Milinda Pointer, MD [facility]  07/18/18 Procedure visit Milinda Pointer, MD [facility]  07/10/18 Office Visit Milinda Pointer, MD [facility]  Showing recent visits within past 90 days and meeting all other requirements   Today's Visits Date Type Provider Dept  09/20/18 Office Visit Milinda Pointer, MD [facility]  Showing today's visits and meeting all other requirements   Future Appointments Date Type Provider Dept  11/15/18 Appointment Milinda Pointer, MD [facility]  Showing future appointments within next 90 days and meeting all other requirements   I discussed the assessment and treatment plan with the patient. The patient was provided an opportunity to ask questions and all were answered. The patient agreed with the plan and demonstrated an understanding of the instructions.  Patient advised to call back or seek an in-person evaluation if the symptoms or condition worsens.  Total duration of non-face-to-face encounter: 15 minutes.  Note by: Gaspar Cola, MD Date: 09/20/2018; Time: 8:41 AM  Note: This dictation was prepared with Dragon dictation. Any transcriptional errors that may result from this process are unintentional.  Disclaimer:  * Given the special circumstances of the COVID-19  pandemic, the federal government has announced that the Office for Civil Rights (OCR) will exercise its enforcement discretion and will not impose penalties on physicians using telehealth in the event of noncompliance with regulatory requirements under the [place] (HIPAA) in connection with the good faith provision of telehealth during the XX123456 national public health emergency. ([place])

## 2018-09-20 ENCOUNTER — Other Ambulatory Visit: Payer: Self-pay

## 2018-09-20 ENCOUNTER — Ambulatory Visit: Payer: Medicaid Other | Attending: Pain Medicine | Admitting: Pain Medicine

## 2018-09-20 DIAGNOSIS — G8929 Other chronic pain: Secondary | ICD-10-CM

## 2018-09-20 DIAGNOSIS — M546 Pain in thoracic spine: Secondary | ICD-10-CM | POA: Diagnosis not present

## 2018-09-20 DIAGNOSIS — G894 Chronic pain syndrome: Secondary | ICD-10-CM | POA: Diagnosis not present

## 2018-09-20 DIAGNOSIS — S8411XS Injury of peroneal nerve at lower leg level, right leg, sequela: Secondary | ICD-10-CM | POA: Diagnosis not present

## 2018-09-20 DIAGNOSIS — M79671 Pain in right foot: Secondary | ICD-10-CM | POA: Diagnosis not present

## 2018-09-20 DIAGNOSIS — M25571 Pain in right ankle and joints of right foot: Secondary | ICD-10-CM | POA: Diagnosis not present

## 2018-09-20 DIAGNOSIS — M792 Neuralgia and neuritis, unspecified: Secondary | ICD-10-CM

## 2018-09-20 MED ORDER — PREGABALIN 150 MG PO CAPS: 150.0000 mg | ORAL_CAPSULE | Freq: Two times a day (BID) | ORAL | 2 refills | Status: DC

## 2018-09-20 MED ORDER — HYDROCODONE-ACETAMINOPHEN 5-325 MG PO TABS: 1.0000 | ORAL_TABLET | Freq: Four times a day (QID) | ORAL | 0 refills | Status: DC | PRN

## 2018-09-20 NOTE — Progress Notes (Signed)
Wasted #12 Oxycodone 5 mg into waste container, witnessed by patient and K. Higinio Plan, RN.

## 2018-09-20 NOTE — Patient Instructions (Signed)
____________________________________________________________________________________________  Medication Rules  Purpose: To inform patients, and their family members, of our rules and regulations.  Applies to: All patients receiving prescriptions (written or electronic).  Pharmacy of record: Pharmacy where electronic prescriptions will be sent. If written prescriptions are taken to a different pharmacy, please inform the nursing staff. The pharmacy listed in the electronic medical record should be the one where you would like electronic prescriptions to be sent.  Electronic prescriptions: In compliance with the [place] Strengthen Opioid Misuse Prevention (STOP) Act of 2017 (Session Law 2017-74/H243), effective January 04, 2018, all controlled substances must be electronically prescribed. Calling prescriptions to the pharmacy will cease to exist.  Prescription refills: Only during scheduled appointments. Applies to all prescriptions.  NOTE: The following applies primarily to controlled substances (Opioid* Pain Medications).   Patient's responsibilities: 1. Pain Pills: Bring all pain pills to every appointment (except for procedure appointments). 2. Pill Bottles: Bring pills in original pharmacy bottle. Always bring the newest bottle. Bring bottle, even if empty. 3. Medication refills: You are responsible for knowing and keeping track of what medications you take and those you need refilled. The day before your appointment: write a list of all prescriptions that need to be refilled. The day of the appointment: give the list to the admitting nurse. Prescriptions will be written only during appointments. No prescriptions will be written on procedure days. If you forget a medication: it will not be "Called in", "Faxed", or "electronically sent". You will need to get another appointment to get these prescribed. No early refills. Do not call asking to have your prescription filled  early. 4. Prescription Accuracy: You are responsible for carefully inspecting your prescriptions before leaving our office. Have the discharge nurse carefully go over each prescription with you, before taking them home. Make sure that your name is accurately spelled, that your address is correct. Check the name and dose of your medication to make sure it is accurate. Check the number of pills, and the written instructions to make sure they are clear and accurate. Make sure that you are given enough medication to last until your next medication refill appointment. 5. Taking Medication: Take medication as prescribed. When it comes to controlled substances, taking less pills or less frequently than prescribed is permitted and encouraged. Never take more pills than instructed. Never take medication more frequently than prescribed.  6. Inform other Doctors: Always inform, all of your healthcare providers, of all the medications you take. 7. Pain Medication from other Providers: You are not allowed to accept any additional pain medication from any other Doctor or Healthcare provider. There are two exceptions to this rule. (see below) In the event that you require additional pain medication, you are responsible for notifying us, as stated below. 8. Medication Agreement: You are responsible for carefully reading and following our Medication Agreement. This must be signed before receiving any prescriptions from our practice. Safely store a copy of your signed Agreement. Violations to the Agreement will result in no further prescriptions. (Additional copies of our Medication Agreement are available upon request.) 9. Laws, Rules, & Regulations: All patients are expected to follow all Federal and State Laws, Statutes, Rules, & Regulations. Ignorance of the Laws does not constitute a valid excuse. The use of any illegal substances is prohibited. 10. Adopted CDC guidelines & recommendations: Target dosing levels will be  at or below 60 MME/day. Use of benzodiazepines** is not recommended.  Exceptions: There are only two exceptions to the rule of not   receiving pain medications from other Healthcare Providers. 1. Exception #1 (Emergencies): In the event of an emergency (i.e.: accident requiring emergency care), you are allowed to receive additional pain medication. However, you are responsible for: As soon as you are able, call our office [phone], at any time of the day or night, and leave a message stating your name, the date and nature of the emergency, and the name and dose of the medication prescribed. In the event that your call is answered by a member of our staff, make sure to document and save the date, time, and the name of the person that took your information.  2. Exception #2 (Planned Surgery): In the event that you are scheduled by another doctor or dentist to have any type of surgery or procedure, you are allowed (for a period no longer than 30 days), to receive additional pain medication, for the acute post-op pain. However, in this case, you are responsible for picking up a copy of our "Post-op Pain Management for Surgeons" handout, and giving it to your surgeon or dentist. This document is available at our office, and does not require an appointment to obtain it. Simply go to our office during business hours (Monday-Thursday from 8:00 AM to 4:00 PM) (Friday 8:00 AM to 12:00 Noon) or if you have a scheduled appointment with us, prior to your surgery, and ask for it by name. In addition, you will need to provide us with your name, name of your surgeon, type of surgery, and date of procedure or surgery.  *Opioid medications include: morphine, codeine, oxycodone, oxymorphone, hydrocodone, hydromorphone, meperidine, tramadol, tapentadol, buprenorphine, fentanyl, methadone. **Benzodiazepine medications include: diazepam (Valium), alprazolam (Xanax), clonazepam (Klonopine), lorazepam (Ativan), clorazepate  (Tranxene), chlordiazepoxide (Librium), estazolam (Prosom), oxazepam (Serax), temazepam (Restoril), triazolam (Halcion) (Last updated: 03/03/2017) ____________________________________________________________________________________________   ____________________________________________________________________________________________  Medication Recommendations and Reminders  Applies to: All patients receiving prescriptions (written and/or electronic).  Medication Rules & Regulations: These rules and regulations exist for your safety and that of others. They are not flexible and neither are we. Dismissing or ignoring them will be considered "non-compliance" with medication therapy, resulting in complete and irreversible termination of such therapy. (See document titled "Medication Rules" for more details.) In all conscience, because of safety reasons, we cannot continue providing a therapy where the patient does not follow instructions.  Pharmacy of record:   Definition: This is the pharmacy where your electronic prescriptions will be sent.   We do not endorse any particular pharmacy.  You are not restricted in your choice of pharmacy.  The pharmacy listed in the electronic medical record should be the one where you want electronic prescriptions to be sent.  If you choose to change pharmacy, simply notify our nursing staff of your choice of new pharmacy.  Recommendations:  Keep all of your pain medications in a safe place, under lock and key, even if you live alone.   After you fill your prescription, take 1 week's worth of pills and put them away in a safe place. You should keep a separate, properly labeled bottle for this purpose. The remainder should be kept in the original bottle. Use this as your primary supply, until it runs out. Once it's gone, then you know that you have 1 week's worth of medicine, and it is time to come in for a prescription refill. If you do this correctly, it  is unlikely that you will ever run out of medicine.  To make sure that the above recommendation works,   it is very important that you make sure your medication refill appointments are scheduled at least 1 week before you run out of medicine. To do this in an effective manner, make sure that you do not leave the office without scheduling your next medication management appointment. Always ask the nursing staff to show you in your prescription , when your medication will be running out. Then arrange for the receptionist to get you a return appointment, at least 7 days before you run out of medicine. Do not wait until you have 1 or 2 pills left, to come in. This is very poor planning and does not take into consideration that we may need to cancel appointments due to bad weather, sickness, or emergencies affecting our staff.  "Partial Fill": If for any reason your pharmacy does not have enough pills/tablets to completely fill or refill your prescription, do not allow for a "partial fill". You will need a separate prescription to fill the remaining amount, which we will not provide. If the reason for the partial fill is your insurance, you will need to talk to the pharmacist about payment alternatives for the remaining tablets, but again, do not accept a partial fill.  Prescription refills and/or changes in medication(s):   Prescription refills, and/or changes in dose or medication, will be conducted only during scheduled medication management appointments. (Applies to both, written and electronic prescriptions.)  No refills on procedure days. No medication will be changed or started on procedure days. No changes, adjustments, and/or refills will be conducted on a procedure day. Doing so will interfere with the diagnostic portion of the procedure.  No phone refills. No medications will be "called into the pharmacy".  No Fax refills.  No weekend refills.  No Holliday refills.  No after hours  refills.  Remember:  Business hours are:  Monday to Thursday 8:00 AM to 4:00 PM Provider's Schedule: Alinna Siple, MD - Appointments are:  Medication management: Monday and Wednesday 8:00 AM to 4:00 PM Procedure day: Tuesday and Thursday 7:30 AM to 4:00 PM Bilal Lateef, MD - Appointments are:  Medication management: Tuesday and Thursday 8:00 AM to 4:00 PM Procedure day: Monday and Wednesday 7:30 AM to 4:00 PM (Last update: 03/03/2017) ____________________________________________________________________________________________   ____________________________________________________________________________________________  CANNABIDIOL (AKA: CBD Oil or Pills)  Applies to: All patients receiving prescriptions of controlled substances (written and/or electronic).  General Information: Cannabidiol (CBD) was discovered in 1940. It is one of some 113 identified cannabinoids in cannabis (Marijuana) plants, accounting for up to 40% of the plant's extract. As of 2018, preliminary clinical research on cannabidiol included studies of anxiety, cognition, movement disorders, and pain.  Cannabidiol is consummed in multiple ways, including inhalation of cannabis smoke or vapor, as an aerosol spray into the cheek, and by mouth. It may be supplied as CBD oil containing CBD as the active ingredient (no added tetrahydrocannabinol (THC) or terpenes), a full-plant CBD-dominant hemp extract oil, capsules, dried cannabis, or as a liquid solution. CBD is thought not have the same psychoactivity as THC, and may affect the actions of THC. Studies suggest that CBD may interact with different biological targets, including cannabinoid receptors and other neurotransmitter receptors. As of 2018 the mechanism of action for its biological effects has not been determined.  In the [place], cannabidiol has a limited approval by the Food and Drug Administration (FDA) for treatment of only two types of epilepsy  disorders. The side effects of long-term use of the drug include somnolence, decreased appetite, diarrhea,   fatigue, malaise, weakness, sleeping problems, and others.  CBD remains a Schedule I drug prohibited for any use.  Legality: Some manufacturers ship CBD products nationally, an illegal action which the FDA has not enforced in 2018, with CBD remaining the subject of an FDA investigational new drug evaluation, and is not considered legal as a dietary supplement or food ingredient as of December 2018. Federal illegality has made it difficult historically to conduct research on CBD. CBD is openly sold in head shops and health food stores in some states where such sales have not been explicitly legalized.  Warning: Because it is not FDA approved for general use or treatment of pain, it is not required to undergo the same manufacturing controls as prescription drugs.  This means that the available cannabidiol (CBD) may be contaminated with THC.  If this is the case, it will trigger a positive urine drug screen (UDS) test for cannabinoids (Marijuana).  Because a positive UDS for illicit substances is a violation of our medication agreement, your opioid analgesics (pain medicine) may be permanently discontinued. (Last update: 03/24/2017) ____________________________________________________________________________________________    

## 2018-09-28 ENCOUNTER — Ambulatory Visit: Payer: Medicaid Other | Admitting: Urology

## 2018-09-28 ENCOUNTER — Other Ambulatory Visit: Payer: Self-pay

## 2018-09-28 ENCOUNTER — Encounter: Payer: Self-pay | Admitting: Urology

## 2018-09-28 VITALS — BP 150/92 | HR 66 | Ht 68.0 in | Wt 201.0 lb

## 2018-09-28 DIAGNOSIS — N529 Male erectile dysfunction, unspecified: Secondary | ICD-10-CM | POA: Diagnosis not present

## 2018-09-28 DIAGNOSIS — R1032 Left lower quadrant pain: Secondary | ICD-10-CM

## 2018-09-28 LAB — BLADDER SCAN AMB NON-IMAGING

## 2018-09-28 MED ORDER — TADALAFIL 20 MG PO TABS: 20.0000 mg | ORAL_TABLET | Freq: Every day | ORAL | 11 refills | Status: DC | PRN

## 2018-09-28 NOTE — Patient Instructions (Signed)
1. Minimize fluid intake after 7pm 2. Urinate twice before bed  Tadalafil tablets (Cialis) What is this medicine? TADALAFIL (tah DA la fil) is used to treat erection problems in men. It is also used for enlargement of the prostate gland in men, a condition called benign prostatic hyperplasia or BPH. This medicine improves urine flow and reduces BPH symptoms. This medicine can also treat both erection problems and BPH when they occur together. This medicine may be used for other purposes; ask your health care provider or pharmacist if you have questions. COMMON BRAND NAME(S): Kathaleen Bury, Cialis What should I tell my health care provider before I take this medicine? They need to know if you have any of these conditions:  bleeding disorders  eye or vision problems, including a rare inherited eye disease called retinitis pigmentosa  anatomical deformation of the penis, Peyronie's disease, or history of priapism (painful and prolonged erection)  heart disease, angina, a history of heart attack, irregular heart beats, or other heart problems  high or low blood pressure  history of blood diseases, like sickle cell anemia or leukemia  history of stomach bleeding  kidney disease  liver disease  stroke  an unusual or allergic reaction to tadalafil, other medicines, foods, dyes, or preservatives  pregnant or trying to get pregnant  breast-feeding How should I use this medicine? Take this medicine by mouth with a glass of water. Follow the directions on the prescription label. You may take this medicine with or without meals. When this medicine is used for erection problems, your doctor may prescribe it to be taken once daily or as needed. If you are taking the medicine as needed, you may be able to have sexual activity 30 minutes after taking it and for up to 36 hours after taking it. Whether you are taking the medicine as needed or once daily, you should not take more than one dose per  day. If you are taking this medicine for symptoms of benign prostatic hyperplasia (BPH) or to treat both BPH and an erection problem, take the dose once daily at about the same time each day. Do not take your medicine more often than directed. Talk to your pediatrician regarding the use of this medicine in children. Special care may be needed. Overdosage: If you think you have taken too much of this medicine contact a poison control center or emergency room at once. NOTE: This medicine is only for you. Do not share this medicine with others. What if I miss a dose? If you are taking this medicine as needed for erection problems, this does not apply. If you miss a dose while taking this medicine once daily for an erection problem, benign prostatic hyperplasia, or both, take it as soon as you remember, but do not take more than one dose per day. What may interact with this medicine? Do not take this medicine with any of the following medications:  nitrates like amyl nitrite, isosorbide dinitrate, isosorbide mononitrate, nitroglycerin  other medicines for erectile dysfunction like avanafil, sildenafil, vardenafil  other tadalafil products (Adcirca)  riociguat This medicine may also interact with the following medications:  certain drugs for high blood pressure  certain drugs for the treatment of HIV infection or AIDS  certain drugs used for fungal or yeast infections, like fluconazole, itraconazole, ketoconazole, and voriconazole  certain drugs used for seizures like carbamazepine, phenytoin, and phenobarbital  grapefruit juice  macrolide antibiotics like clarithromycin, erythromycin, troleandomycin  medicines for prostate problems  rifabutin, rifampin  or rifapentine This list may not describe all possible interactions. Give your health care provider a list of all the medicines, herbs, non-prescription drugs, or dietary supplements you use. Also tell them if you smoke, drink alcohol, or  use illegal drugs. Some items may interact with your medicine. What should I watch for while using this medicine? If you notice any changes in your vision while taking this drug, call your doctor or health care professional as soon as possible. Stop using this medicine and call your health care provider right away if you have a loss of sight in one or both eyes. Contact your doctor or health care professional right away if the erection lasts longer than 4 hours or if it becomes painful. This may be a sign of serious problem and must be treated right away to prevent permanent damage. If you experience symptoms of nausea, dizziness, chest pain or arm pain upon initiation of sexual activity after taking this medicine, you should refrain from further activity and call your doctor or health care professional as soon as possible. Do not drink alcohol to excess (examples, 5 glasses of wine or 5 shots of whiskey) when taking this medicine. When taken in excess, alcohol can increase your chances of getting a headache or getting dizzy, increasing your heart rate or lowering your blood pressure. Using this medicine does not protect you or your partner against HIV infection (the virus that causes AIDS) or other sexually transmitted diseases. What side effects may I notice from receiving this medicine? Side effects that you should report to your doctor or health care professional as soon as possible:  allergic reactions like skin rash, itching or hives, swelling of the face, lips, or tongue  breathing problems  changes in hearing  changes in vision  chest pain  fast, irregular heartbeat  prolonged or painful erection  seizures Side effects that usually do not require medical attention (report to your doctor or health care professional if they continue or are bothersome):  back pain  dizziness  flushing  headache  indigestion  muscle aches  nausea  stuffy or runny nose This list may not  describe all possible side effects. Call your doctor for medical advice about side effects. You may report side effects to FDA at 1-800-FDA-1088. Where should I keep my medicine? Keep out of the reach of children. Store at room temperature between 15 and 30 degrees C (59 and 86 degrees F). Throw away any unused medicine after the expiration date. NOTE: This sheet is a summary. It may not cover all possible information. If you have questions about this medicine, talk to your doctor, pharmacist, or health care provider.  2020 Elsevier/Gold Standard (2013-05-11 13:15:49)

## 2018-09-28 NOTE — Progress Notes (Signed)
   09/28/2018 5:43 PM   KAHAAN ALVIZO 24-Dec-1955 [id number]  Reason for visit: ED  I saw Mr. Lorenson in urology clinic today for follow-up.  He is a 63 year old male I previously followed for nephrolithiasis.  He presents today to discuss erectile dysfunction and inability to orgasm.  He reports this is been going on for around 12 months.  He does take narcotics at baseline for chronic pain, as well as sertraline 100 mg for depression.  Nitroglycerin is listed on his medication list, however he reports he is never taken this before.  I had a long conversation about erectile dysfunction and his inability to orgasm with his narcotics and sertraline.  I recommended a trial of Cialis 20 mg on demand.  We discussed the risks and benefits at length of this medication, and that he should never take this medication with nitrates for chest pain.  Trial of Cialis 20 mg on demand RTC 6 months for symptom check and KUB for stone surveillance   A total of 15 minutes were spent face-to-face with the patient, greater than 50% was spent in patient education, counseling, and coordination of care regarding ED.  Billey Co, [facility] [address] [place], [place] [address] [phone]

## 2018-10-09 ENCOUNTER — Telehealth: Payer: Self-pay | Admitting: Cardiovascular Disease

## 2018-10-09 ENCOUNTER — Telehealth: Payer: Self-pay | Admitting: Family Medicine

## 2018-10-09 NOTE — Telephone Encounter (Signed)
Since Friday pt has noticed his BP trending up.  Been 150s-160s/90s-100s.  When elevated he has a headache and blurred vision.  Takes Losartan 100mg  and Imdur 15mg  at night.  This morning BP was 157/101.  Laid down and 45 mins later was 155/96.  Pt denies increased salt in diet.  States he has actually been eating a lot of salad recently due to a gum issue he has.  Unsure of sodium content in dressing.  Not having any symptoms currently, just generally feels bad since having elevated BP all weekend.  Advised I will send to Dr. Rockey Situ for review.

## 2018-10-09 NOTE — Telephone Encounter (Signed)
Wanting to go up to 1 pill and a half of his sertraline (ZOLOFT) 100 MG tablet   Dad has gotten worse and don't know how long he is going to live. Under a lot of stress and BP has been up.    Please advise.  Thanks, [employer]

## 2018-10-09 NOTE — Telephone Encounter (Signed)
Pt c/o BP issue: STAT if pt c/o blurred vision, one-sided weakness or slurred speech  1. What are your last 5 BP readings?    150s-160's / 90's -100's   2. Are you having any other symptoms (ex. Dizziness, headache, blurred vision, passed out)?     Headache blurred vision   3. What is your BP issue? Started to trend up Friday

## 2018-10-10 MED ORDER — SERTRALINE HCL 100 MG PO TABS: 150.0000 mg | ORAL_TABLET | Freq: Every day | ORAL | 2 refills | Status: DC

## 2018-10-10 NOTE — Telephone Encounter (Signed)
He can increase it to 150 mg daily (can send new Rx for one month supply and 2 refills). If BP remains elevated or Zoloft is not helping, encourage him to see me sooner for f/u

## 2018-10-10 NOTE — Telephone Encounter (Signed)
He was previously on HCTZ 25 mg daily We held the medication as blood pressure was running too low Would consider restarting daily

## 2018-10-10 NOTE — Telephone Encounter (Signed)
To Dr. Gollan to review.  

## 2018-10-10 NOTE — Telephone Encounter (Signed)
Patient advised. RX sent to [facility] pharmacy.  

## 2018-10-10 NOTE — Telephone Encounter (Signed)
Patient calling to check status of advice form Gollan on HTN

## 2018-10-11 ENCOUNTER — Other Ambulatory Visit: Payer: Self-pay | Admitting: Pulmonary Disease

## 2018-10-11 MED ORDER — HYDROCHLOROTHIAZIDE 25 MG PO TABS: 25.0000 mg | ORAL_TABLET | Freq: Every day | ORAL | 3 refills | Status: DC

## 2018-10-11 NOTE — Telephone Encounter (Signed)
Pt advised and will restart HCTZ 25mg  qd.. he will call back in several days and let us know how his BP is doing.

## 2018-10-16 ENCOUNTER — Telehealth: Payer: Self-pay | Admitting: Pain Medicine

## 2018-10-16 NOTE — Telephone Encounter (Signed)
Patient is having a lot of back pain, new onset, he is going to see pcp and have some xrays.

## 2018-10-17 ENCOUNTER — Ambulatory Visit
Admission: RE | Admit: 2018-10-17 | Discharge: 2018-10-17 | Disposition: A | Discharge status: Home / Self Care | Payer: Medicaid Other | Source: Ambulatory Visit | Attending: Family Medicine | Admitting: Family Medicine

## 2018-10-17 ENCOUNTER — Encounter: Payer: Self-pay | Admitting: Family Medicine

## 2018-10-17 ENCOUNTER — Ambulatory Visit (INDEPENDENT_AMBULATORY_CARE_PROVIDER_SITE_OTHER): Payer: Medicaid Other | Admitting: Family Medicine

## 2018-10-17 ENCOUNTER — Other Ambulatory Visit: Payer: Self-pay

## 2018-10-17 VITALS — BP 117/82 | HR 82 | Temp 96.8°F | Wt 198.0 lb

## 2018-10-17 DIAGNOSIS — Z9889 Other specified postprocedural states: Secondary | ICD-10-CM | POA: Diagnosis not present

## 2018-10-17 DIAGNOSIS — M545 Low back pain, unspecified: Secondary | ICD-10-CM

## 2018-10-17 NOTE — Patient Instructions (Signed)
Acute Back Pain, Adult Acute back pain is sudden and usually short-lived. It is often caused by an injury to the muscles and tissues in the back. The injury may result from:  A muscle or ligament getting overstretched or torn (strained). Ligaments are tissues that connect bones to each other. Lifting something improperly can cause a back strain.  Wear and tear (degeneration) of the spinal disks. Spinal disks are circular tissue that provides cushioning between the bones of the spine (vertebrae).  Twisting motions, such as while playing sports or doing yard work.  A hit to the back.  Arthritis. You may have a physical exam, lab tests, and imaging tests to find the cause of your pain. Acute back pain usually goes away with rest and home care. Follow these instructions at home: Managing pain, stiffness, and swelling  Take over-the-counter and prescription medicines only as told by your health care provider.  Your health care provider may recommend applying ice during the first 24-48 hours after your pain starts. To do this: ? Put ice in a plastic bag. ? Place a towel between your skin and the bag. ? Leave the ice on for 20 minutes, 2-3 times a day.  If directed, apply heat to the affected area as often as told by your health care provider. Use the heat source that your health care provider recommends, such as a moist heat pack or a heating pad. ? Place a towel between your skin and the heat source. ? Leave the heat on for 20-30 minutes. ? Remove the heat if your skin turns bright red. This is especially important if you are unable to feel pain, heat, or cold. You have a greater risk of getting burned. Activity   Do not stay in bed. Staying in bed for more than 1-2 days can delay your recovery.  Sit up and stand up straight. Avoid leaning forward when you sit, or hunching over when you stand. ? If you work at a desk, sit close to it so you do not need to lean over. Keep your chin tucked  in. Keep your neck drawn back, and keep your elbows bent at a right angle. Your arms should look like the letter "L." ? Sit high and close to the steering wheel when you drive. Add lower back (lumbar) support to your car seat, if needed.  Take short walks on even surfaces as soon as you are able. Try to increase the length of time you walk each day.  Do not sit, drive, or stand in one place for more than 30 minutes at a time. Sitting or standing for long periods of time can put stress on your back.  Do not drive or use heavy machinery while taking prescription pain medicine.  Use proper lifting techniques. When you bend and lift, use positions that put less stress on your back: ? Bend your knees. ? Keep the load close to your body. ? Avoid twisting.  Exercise regularly as told by your health care provider. Exercising helps your back heal faster and helps prevent back injuries by keeping muscles strong and flexible.  Work with a physical therapist to make a safe exercise program, as recommended by your health care provider. Do any exercises as told by your physical therapist. Lifestyle  Maintain a healthy weight. Extra weight puts stress on your back and makes it difficult to have good posture.  Avoid activities or situations that make you feel anxious or stressed. Stress and anxiety increase muscle   tension and can make back pain worse. Learn ways to manage anxiety and stress, such as through exercise. General instructions  Sleep on a firm mattress in a comfortable position. Try lying on your side with your knees slightly bent. If you lie on your back, put a pillow under your knees.  Follow your treatment plan as told by your health care provider. This may include: ? Cognitive or behavioral therapy. ? Acupuncture or massage therapy. ? Meditation or yoga. Contact a health care provider if:  You have pain that is not relieved with rest or medicine.  You have increasing pain going down  into your legs or buttocks.  Your pain does not improve after 2 weeks.  You have pain at night.  You lose weight without trying.  You have a fever or chills. Get help right away if:  You develop new bowel or bladder control problems.  You have unusual weakness or numbness in your arms or legs.  You develop nausea or vomiting.  You develop abdominal pain.  You feel faint. Summary  Acute back pain is sudden and usually short-lived.  Use proper lifting techniques. When you bend and lift, use positions that put less stress on your back.  Take over-the-counter and prescription medicines and apply heat or ice as directed by your health care provider. This information is not intended to replace advice given to you by your health care provider. Make sure you discuss any questions you have with your health care provider. Document Released: 12/21/2004 Document Revised: 04/11/2018 Document Reviewed: 08/04/2016 Elsevier Patient Education  2020 [employer]  

## 2018-10-17 NOTE — Progress Notes (Signed)
Patient: Caleb [name] Male    DOB: 1955/02/06   63 y.o.   MRN: [id number] Visit Date: 10/17/2018  Today's Provider: Lavon Paganini, MD   Chief Complaint  Patient presents with  . Back Pain   Subjective:    I, Porsha McClurkin CMA, am acting as a scribe for Lavon Paganini, MD.    Back Pain The current episode started 1 to 4 weeks ago. The problem has been gradually worsening since onset. The quality of the pain is described as shooting, stabbing and burning. Radiates to: Bilateral hip per pt. The pain is at a severity of 4/10. The pain is moderate. The pain is the same all the time. The symptoms are aggravated by sitting, lying down, position, bending, twisting and standing. Stiffness is present in the morning. Pertinent negatives include no leg pain, tingling or weakness. He has tried heat, ice, bed rest and walking for the symptoms. The treatment provided no relief.  Patient states that he tried to schedule appointment with his pain clinic doctor but they requested he come see Dr.B first to get referral to them.  He has seen Dr Cari Caraway before for cervical stenosis.  Started after hitting a bump while mowing the lawn 1-2 weeks ago. Getting worse. No radiating to bilateral hip (burning, stabbing). Has woken him from sleep a few times.  Tried heat and ice. Takes muscle relaxers regularly.  Has h/o lumbar laminectomy in 1989 and 1999. No hardware.  Takes Lyrica and Norco per pain management for other chronic pain.  Allergies  Allergen Reactions  . Lisinopril Cough  . Oxycodone Nausea And Vomiting     Current Outpatient Medications:  .  ANORO ELLIPTA 62.5-25 MCG/INH AEPB, TAKE 1 PUFF BY MOUTH EVERY DAY, Disp: 60 each, Rfl: 5 .  aspirin EC 81 MG tablet, Take 81 mg by mouth daily., Disp: , Rfl:  .  cetirizine (ZYRTEC) 10 MG tablet, Take 1 tablet (10 mg total) by mouth daily., Disp: 90 tablet, Rfl: 3 .  fluticasone (FLONASE) 50 MCG/ACT nasal spray, SPRAY 2 SPRAYS INTO EACH  NOSTRIL EVERY DAY, Disp: , Rfl:  .  hydrochlorothiazide (HYDRODIURIL) 25 MG tablet, Take 1 tablet (25 mg total) by mouth daily., Disp: 90 tablet, Rfl: 3 .  HYDROcodone-acetaminophen (NORCO/VICODIN) 5-325 MG tablet, Take 1 tablet by mouth every 6 (six) hours as needed for severe pain. Must last 30 days, Disp: 120 tablet, Rfl: 0 .  [START ON 10/20/2018] HYDROcodone-acetaminophen (NORCO/VICODIN) 5-325 MG tablet, Take 1 tablet by mouth every 6 (six) hours as needed for severe pain. Must last 30 days, Disp: 120 tablet, Rfl: 0 .  [START ON 11/19/2018] HYDROcodone-acetaminophen (NORCO/VICODIN) 5-325 MG tablet, Take 1 tablet by mouth every 6 (six) hours as needed for severe pain. Must last 30 days, Disp: 120 tablet, Rfl: 0 .  isosorbide mononitrate (IMDUR) 30 MG 24 hr tablet, TAKE 0.5 TABLET(S) EVERY DAY BY ORAL ROUTE IN THE MORNING., Disp: 15 tablet, Rfl: 5 .  lidocaine (LIDODERM) 5 %, Place 1 patch onto the skin every 12 (twelve) hours. Remove & Discard patch within 12 hours or as directed by MD, Disp: 10 patch, Rfl: 0 .  losartan (COZAAR) 100 MG tablet, Take 1 tablet (100 mg total) by mouth daily., Disp: 90 tablet, Rfl: 1 .  methocarbamol (ROBAXIN) 750 MG tablet, Take 1 tablet (750 mg total) by mouth every 8 (eight) hours as needed for muscle spasms., Disp: 270 tablet, Rfl: 0 .  mometasone (ELOCON) 0.1 % lotion, APPLY  1 ML ONTO THE SKIN DAILY TO EARS FOR PSORIASIS, Disp: , Rfl:  .  naloxone (NARCAN) nasal spray 4 mg/0.1 mL, Use in case of opioid overdose, Disp: 1 each, Rfl: 0 .  neomycin-polymyxin-hydrocortisone (CORTISPORIN) 3.5-10000-1 OTIC suspension, Place 3 drops into the right ear 4 (four) times daily. X 5-7 days, Disp: 10 mL, Rfl: 0 .  nitroGLYCERIN (NITROSTAT) 0.4 MG SL tablet, Place 1 tablet (0.4 mg total) under the tongue every 5 (five) minutes as needed for chest pain., Disp: 30 tablet, Rfl: 3 .  pregabalin (LYRICA) 150 MG capsule, Take 1 capsule (150 mg total) by mouth 2 (two) times daily., Disp:  60 capsule, Rfl: 2 .  rosuvastatin (CRESTOR) 40 MG tablet, TAKE 1 TABLET BY MOUTH EVERY DAY (Patient taking differently: Take 40 mg by mouth daily. ), Disp: 90 tablet, Rfl: 3 .  Secukinumab (COSENTYX [place]), Inject into the skin every 14 (fourteen) days., Disp: , Rfl:  .  sertraline (ZOLOFT) 100 MG tablet, Take 1.5 tablets (150 mg total) by mouth daily., Disp: 45 tablet, Rfl: 2 .  tadalafil (CIALIS) 20 MG tablet, Take 1 tablet (20 mg total) by mouth daily as needed for erectile dysfunction., Disp: 15 tablet, Rfl: 11 .  varenicline (CHANTIX CONTINUING MONTH PAK) 1 MG tablet, Take 1 tablet (1 mg total) by mouth 2 (two) times daily., Disp: 60 tablet, Rfl: 5  Review of Systems  Constitutional: Negative.   Respiratory: Negative.   Genitourinary: Negative.   Musculoskeletal: Positive for back pain.  Neurological: Negative for tingling and weakness.    Social History   Tobacco Use  . Smoking status: Current Every Day Smoker    Packs/day: 0.50    Years: 49.00    Pack years: 24.50    Types: Cigarettes  . Smokeless tobacco: Never Used  . Tobacco comment: started smoking at age 4; has decreased cigarette use from 2 PPD to 0.5 PPD to 1 PPD  Substance Use Topics  . Alcohol use: Yes    Alcohol/week: 4.0 standard drinks    Types: 4 Cans of beer per week      Objective:   BP 117/82 (BP Location: Left Arm, Patient Position: Sitting, Cuff Size: Normal)   Pulse 82   Temp (!) 96.8 F (36 C) (Temporal)   Wt 198 lb (89.8 kg)   SpO2 97%   BMI 30.11 kg/m  Vitals:   10/17/18 0938  BP: 117/82  Pulse: 82  Temp: (!) 96.8 F (36 C)  TempSrc: Temporal  SpO2: 97%  Weight: 198 lb (89.8 kg)  Body mass index is 30.11 kg/m.   Physical Exam Vitals signs reviewed.  Constitutional:      General: He is not in acute distress.    Appearance: Normal appearance. He is not diaphoretic.  HENT:     Head: Normocephalic and atraumatic.  Eyes:     General: No scleral icterus.    Conjunctiva/sclera:  Conjunctivae normal.  Neck:     Musculoskeletal: Neck supple.  Cardiovascular:     Rate and Rhythm: Normal rate and regular rhythm.     Pulses: Normal pulses.     Heart sounds: Normal heart sounds. No murmur.  Pulmonary:     Effort: Pulmonary effort is normal. No respiratory distress.     Breath sounds: Normal breath sounds. No wheezing.  Abdominal:     General: There is no distension.     Palpations: Abdomen is soft.     Tenderness: There is no abdominal tenderness.  Musculoskeletal:  Right lower leg: No edema.     Left lower leg: No edema.     Comments: Back: Tenderness over the midline of lumbar spine around L4-L5.  Some paraspinal musculature tightness and tenderness to palpation.  Negative straight leg raise bilaterally.  Lower extremity strength and sensation is at baseline.  Lymphadenopathy:     Cervical: No cervical adenopathy.  Skin:    General: Skin is warm and dry.     Capillary Refill: Capillary refill takes less than 2 seconds.     Findings: No rash.  Neurological:     Mental Status: He is alert and oriented to person, place, and time. Mental status is at baseline.     Sensory: No sensory deficit.     Motor: No weakness.     Gait: Gait abnormal (at baseline, antalgic).  Psychiatric:        Mood and Affect: Mood normal.        Behavior: Behavior normal.      No results found for any visits on 10/17/18.     Assessment & Plan   1. Acute midline low back pain without sciatica 2. History of lumbar laminectomy -Patient has chronic pain syndrome including upper extremity pain, lower extremity pain, thoracic back pain, chronic neck pain, neuropathic pain - This back pain he is experiencing currently is new, however -No radiculopathy symptoms and exam is fairly benign today, except for the midline tenderness -Given that patient has had history of lumbar laminectomies, he warrants imaging and further follow-up - Lumbar spine x-rays ordered today - Patient is  already a patient with neurosurgery, so we will put the referral in for him to see them regarding this as he has had 2 surgeries previously in this area -Pain is managed by pain management - DG Lumbar Spine Complete; Future - Ambulatory referral to Neurosurgery     Return if symptoms worsen or fail to improve.   The entirety of the information documented in the History of Present Illness, Review of Systems and Physical Exam were personally obtained by me. Portions of this information were initially documented by [facility], CMA and reviewed by me for thoroughness and accuracy.    Bacigalupo, Dionne Bucy, MD MPH [facility]

## 2018-10-18 ENCOUNTER — Telehealth: Payer: Self-pay

## 2018-10-18 NOTE — Telephone Encounter (Signed)
-----   Message from Virginia Crews, MD sent at 10/17/2018  6:56 PM EDT ----- Arthritis noted, but no acute changes.  Recommend f/u with neurosurgery as we discussed

## 2018-10-18 NOTE — Telephone Encounter (Signed)
Patient returned call and was advised. 

## 2018-10-18 NOTE — Telephone Encounter (Signed)
LVMTRC 

## 2018-10-19 ENCOUNTER — Other Ambulatory Visit: Payer: Self-pay | Admitting: Pulmonary Disease

## 2018-10-19 ENCOUNTER — Other Ambulatory Visit: Payer: Self-pay | Admitting: Pain Medicine

## 2018-10-19 DIAGNOSIS — G8929 Other chronic pain: Secondary | ICD-10-CM

## 2018-10-19 DIAGNOSIS — M7918 Myalgia, other site: Secondary | ICD-10-CM

## 2018-11-03 DIAGNOSIS — M545 Low back pain: Secondary | ICD-10-CM | POA: Diagnosis not present

## 2018-11-15 ENCOUNTER — Encounter: Payer: Medicaid Other | Admitting: Pain Medicine

## 2018-11-22 ENCOUNTER — Telehealth: Payer: Self-pay | Admitting: Pain Medicine

## 2018-11-22 NOTE — Telephone Encounter (Signed)
[facility] called and stated that the pts RX for hydrocodone acet needs PA her phone number is [phone]. She states she will call back in a few days to check on the status of this PA.

## 2018-11-23 NOTE — Telephone Encounter (Signed)
PA done

## 2018-12-07 ENCOUNTER — Encounter: Payer: Self-pay | Admitting: Pain Medicine

## 2018-12-11 ENCOUNTER — Other Ambulatory Visit: Payer: Self-pay

## 2018-12-11 ENCOUNTER — Ambulatory Visit: Payer: Medicaid Other | Attending: Pain Medicine | Admitting: Pain Medicine

## 2018-12-11 DIAGNOSIS — M25571 Pain in right ankle and joints of right foot: Secondary | ICD-10-CM | POA: Diagnosis not present

## 2018-12-11 DIAGNOSIS — M7918 Myalgia, other site: Secondary | ICD-10-CM | POA: Diagnosis not present

## 2018-12-11 DIAGNOSIS — M79671 Pain in right foot: Secondary | ICD-10-CM

## 2018-12-11 DIAGNOSIS — S8411XS Injury of peroneal nerve at lower leg level, right leg, sequela: Secondary | ICD-10-CM | POA: Diagnosis not present

## 2018-12-11 DIAGNOSIS — G8929 Other chronic pain: Secondary | ICD-10-CM

## 2018-12-11 DIAGNOSIS — M546 Pain in thoracic spine: Secondary | ICD-10-CM | POA: Diagnosis not present

## 2018-12-11 DIAGNOSIS — M792 Neuralgia and neuritis, unspecified: Secondary | ICD-10-CM

## 2018-12-11 DIAGNOSIS — G894 Chronic pain syndrome: Secondary | ICD-10-CM

## 2018-12-11 MED ORDER — HYDROCODONE-ACETAMINOPHEN 5-325 MG PO TABS: 1.0000 | ORAL_TABLET | Freq: Four times a day (QID) | ORAL | 0 refills | Status: DC | PRN

## 2018-12-11 MED ORDER — METHOCARBAMOL 750 MG PO TABS: 750.0000 mg | ORAL_TABLET | Freq: Three times a day (TID) | ORAL | 0 refills | Status: DC | PRN

## 2018-12-11 MED ORDER — PREGABALIN 150 MG PO CAPS: 150.0000 mg | ORAL_CAPSULE | Freq: Two times a day (BID) | ORAL | 5 refills | Status: DC

## 2018-12-11 NOTE — Progress Notes (Signed)
Pain Management Virtual Encounter Note - Virtual Visit via Telephone Telehealth (real-time audio visits between healthcare provider and patient).   Patient's Phone No. & Preferred Pharmacy:  [phone] (home); [phone] (mobile); (Preferred) [phone] [email or web address]  [id number] Lorina Rabon, [facility] [place] [address] [place] [address] Phone: [phone] Fax: [phone]    Pre-screening note:  Our staff contacted Mr. Deraad and offered him an "in person", "face-to-face" appointment versus a telephone encounter. He indicated preferring the telephone encounter, at this time.   Reason for Virtual Visit: COVID-19*  Social distancing based on CDC and AMA recommendations.   I contacted [name] on 12/11/2018 via telephone.      I clearly identified myself as Gaspar Cola, MD. I verified that I was speaking with the correct person using two identifiers (Name: YONIS RIEHL, and date of birth: January 03, 1956).  Advanced Informed Consent I sought verbal advanced consent from [name] for virtual visit interactions. I informed Mr. Mobilia of possible security and privacy concerns, risks, and limitations associated with providing "not-in-person" medical evaluation and management services. I also informed Mr. Schechter of the availability of "in-person" appointments. Finally, I informed him that there would be a charge for the virtual visit and that he could be  personally, fully or partially, financially responsible for it. Mr. Broomhead expressed understanding and agreed to proceed.   Historic Elements   Mr. ORI BOLEJACK is a 63 y.o. year old, male patient evaluated today after his last encounter by our practice on 11/22/2018. Mr. Delsignore  has a past medical history of Chronic foot pain, right (2015), COPD (chronic obstructive pulmonary disease) ([place]), Coronary artery disease, Emphysema lung ([place]), Hyperlipidemia, Hypertension, Leucocytosis,  Myocardial infarction ([place]) (2015), and OSA on CPAP. He also  has a past surgical history that includes Cardiac catheterization (2015); Lumbar laminectomy (1989, 1999); Cervical fusion (1988, 1998); Liver surgery (2015); Partial colectomy (1990); Inguinal hernia repair (Bilateral, 1975); Lithotripsy; and Colonoscopy with propofol (N/A, 02/24/2017). Mr. Blackmond has a current medication list which includes the following prescription(s): anoro ellipta, aspirin ec, cetirizine, fluticasone, hydrocodone-acetaminophen, hydrocodone-acetaminophen, hydrocodone-acetaminophen, isosorbide mononitrate, losartan, mometasone, naloxone, neomycin-polymyxin-hydrocortisone, nitroglycerin, pregabalin, rosuvastatin, secukinumab, sertraline, tadalafil, and methocarbamol. He  reports that he has been smoking cigarettes. He has a 24.50 pack-year smoking history. He has never used smokeless tobacco. He reports current alcohol use of about 4.0 standard drinks of alcohol per week. He reports that he does not use drugs. Mr. Schiesser is allergic to lisinopril and oxycodone.   HPI  Today, he is being contacted for medication management.  The patient indicates doing well with the current medication regimen. No adverse reactions or side effects reported to the medications.  Today the patient did mention that the medicine does not seem to be working as well as it used to and therefore I have recommended him that he do a "Drug Holiday".  Previously we had attempted to switch him to oxycodone, but he was unable to tolerate it.  Pharmacotherapy Assessment  Analgesic: Hydrocodone/APAP 5/325 1 tablet every 6 hours (20 mg/day of hydrocodone).  (Unable to tolerate an oxycodone IR trial due to stomachaches, nausea, headaches, and excessive somnolence.) MME/day: 20 mg/day.   Monitoring: Pharmacotherapy: No side-effects or adverse reactions reported. [place] PMP: PDMP reviewed during this encounter.       Compliance: No problems identified. Effectiveness:  Clinically acceptable. Plan: Refer to "POC".  UDS:  Summary  Date Value Ref Range Status  12/21/2017 FINAL  Final  Comment:    ==================================================================== TOXASSURE SELECT 13 (MW) ==================================================================== Test                             Result       Flag       Units Drug Present and Declared for Prescription Verification   Oxycodone                      276          EXPECTED   ng/mg creat   Oxymorphone                    1086         EXPECTED   ng/mg creat   Noroxycodone                   1214         EXPECTED   ng/mg creat   Noroxymorphone                 397          EXPECTED   ng/mg creat    Sources of oxycodone are scheduled prescription medications.    Oxymorphone, noroxycodone, and noroxymorphone are expected    metabolites of oxycodone. Oxymorphone is also available as a    scheduled prescription medication. Drug Absent but Declared for Prescription Verification   Hydrocodone                    Not Detected UNEXPECTED ng/mg creat ==================================================================== Test                      Result    Flag   Units      Ref Range   Creatinine              96               mg/dL      >=20 ==================================================================== Declared Medications:  The flagging and interpretation on this report are based on the  following declared medications.  Unexpected results may arise from  inaccuracies in the declared medications.  **Note: The testing scope of this panel includes these medications:  Hydrocodone (Norco)  Oxycodone  **Note: The testing scope of this panel does not include following  reported medications:  Acetaminophen (Norco)  Albuterol  Aspirin (Aspirin 81)  Fluticasone (Flovent)  Hydrochlorothiazide (Hydrodiuril)  Isosorbide (Imdur)  Losartan (Cozaar)  Methocarbamol (Robaxin)  Metoprolol (Lopressor)   Nitroglycerin (Nitrostat)  Pregabalin (Lyrica)  Rosuvastatin (Crestor)  Secukinumab  Tiotropium (Spiriva)  Triamcinolone (Kenalog) ==================================================================== For clinical consultation, please call [phone]. ====================================================================    Laboratory Chemistry Profile (12 mo)  Renal: 03/15/2018: BUN/Creatinine Ratio 23 08/30/2018: BUN 22; Creatinine, Ser 0.78  Lab Results  Component Value Date   GFRAA >60 08/30/2018   GFRNONAA >60 08/30/2018   Hepatic: 08/30/2018: Albumin 3.8 Lab Results  Component Value Date   AST 22 08/30/2018   ALT 23 08/30/2018   Other: No results found for requested labs within last 8760 hours. Note: Above Lab results reviewed.  Imaging  DG Lumbar Spine Complete CLINICAL DATA:  Low back pain after injury 2 weeks ago.  EXAM: LUMBAR SPINE - COMPLETE 4+ VIEW  COMPARISON:  None.  FINDINGS: No fracture or spondylolisthesis is noted. Mild degenerative disc disease is noted at L5-S1 with anterior osteophyte formation. Anterior osteophyte formation is noted  at all levels of the lumbar spine. Remaining disc spaces are unremarkable.  IMPRESSION: Multilevel degenerative changes are noted. No acute abnormality seen in the lumbar spine.  Electronically Signed   By: Marijo Conception M.D.   On: 10/17/2018 16:39   Assessment  The primary encounter diagnosis was Chronic pain syndrome. 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  Plan of Care  Problem-specific:  No problem-specific Assessment & Plan notes found for this encounter.  I have discontinued Karey L. Aguirre's varenicline,  lidocaine, and hydrochlorothiazide. I am also having him start on HYDROcodone-acetaminophen and HYDROcodone-acetaminophen. Additionally, I am having him maintain his aspirin EC, rosuvastatin, cetirizine, neomycin-polymyxin-hydrocortisone, fluticasone, isosorbide mononitrate, nitroGLYCERIN, naloxone, losartan, mometasone, Secukinumab (COSENTYX [place]), tadalafil, sertraline, Anoro Ellipta, HYDROcodone-acetaminophen, methocarbamol, and pregabalin.  Pharmacotherapy (Medications Ordered): Meds ordered this encounter  Medications  . HYDROcodone-acetaminophen (NORCO/VICODIN) 5-325 MG tablet    Sig: Take 1 tablet by mouth every 6 (six) hours as needed for severe pain. Must last 30 days    Dispense:  120 tablet    Refill:  0    Chronic Pain: STOP Act (Not applicable) Fill 1 day early if closed on refill date. Do not fill until: 12/19/2018. To last until: 01/18/2019. Avoid benzodiazepines within 8 hours of opioids  . HYDROcodone-acetaminophen (NORCO/VICODIN) 5-325 MG tablet    Sig: Take 1 tablet by mouth every 6 (six) hours as needed for severe pain. Must last 30 days    Dispense:  120 tablet    Refill:  0    Chronic Pain: STOP Act (Not applicable) Fill 1 day early if closed on refill date. Do not fill until: 01/18/2019. To last until: 02/17/2019. Avoid benzodiazepines within 8 hours of opioids  . HYDROcodone-acetaminophen (NORCO/VICODIN) 5-325 MG tablet    Sig: Take 1 tablet by mouth every 6 (six) hours as needed for severe pain. Must last 30 days    Dispense:  120 tablet    Refill:  0    Chronic Pain: STOP Act (Not applicable) Fill 1 day early if closed on refill date. Do not fill until: 02/17/2019. To last until: 03/19/2019. Avoid benzodiazepines within 8 hours of opioids  . methocarbamol (ROBAXIN) 750 MG tablet    Sig: Take 1 tablet (750 mg total) by mouth every 8 (eight) hours as needed for muscle spasms.    Dispense:  270 tablet    Refill:  0    Fill one day early if pharmacy is closed on scheduled  refill date. May substitute for generic if available.  . pregabalin (LYRICA) 150 MG capsule    Sig: Take 1 capsule (150 mg total) by mouth 2 (two) times daily.    Dispense:  60 capsule    Refill:  5    Fill one day early if pharmacy is closed on scheduled refill date. May substitute for generic if available.   Orders:  No orders of the defined types were placed in this encounter.  Follow-up plan:   Return in about 14 weeks (around 03/19/2019) for (VV), (MM).      Interventional management options: Considering:   Possible bilateral cervical facet RFA Diagnostic right ankle block Diagnosticright lumbar sympathetic block Diagnostic midlineTESI Diagnostic bilateral thoracic facet block Possible bilateral thoracic facet RFA   Palliative  PRN treatment(s):   Palliative left CESI #4 Diagnostic bilateral cervical facet block#3 Palliative/diagnostic right-sidedCommon Peroneal NB#3 Palliative right-sided common peroneal nerve RFA #2(last done on 06/21/2017)     Recent Visits Date Type Provider Dept  09/20/18 Office Visit Milinda Pointer, MD [facility]  Showing recent visits within past 90 days and meeting all other requirements   Today's Visits Date Type Provider Dept  12/11/18 Telemedicine Milinda Pointer, MD [facility]  Showing today's visits and meeting all other requirements   Future Appointments No visits were found meeting these conditions.  Showing future appointments within next 90 days and meeting all other requirements   I discussed the assessment and treatment plan with the patient. The patient was provided an opportunity to ask questions and all were answered. The patient agreed with the plan and demonstrated an understanding of the instructions.  Patient advised to call back or seek an in-person evaluation if the symptoms or condition worsens.  Total duration of non-face-to-face encounter: 13 minutes.  Note by: Gaspar Cola, MD Date: 12/11/2018; Time: 10:17 AM  Note: This dictation was prepared with Dragon dictation. Any transcriptional errors that may result from this process are unintentional.  Disclaimer:  * Given the special circumstances of the COVID-19 pandemic, the federal government has announced that the Office for Civil Rights (OCR) will exercise its enforcement discretion and will not impose penalties on physicians using telehealth in the event of noncompliance with regulatory requirements under the [place] (HIPAA) in connection with the good faith provision of telehealth during the XX123456 national public health emergency. ([place])

## 2018-12-14 ENCOUNTER — Ambulatory Visit
Admission: RE | Admit: 2018-12-14 | Discharge: 2018-12-14 | Disposition: A | Discharge status: Home / Self Care | Payer: Medicaid Other | Source: Ambulatory Visit | Attending: Pain Medicine | Admitting: Pain Medicine

## 2018-12-14 ENCOUNTER — Encounter: Payer: Self-pay | Admitting: Pain Medicine

## 2018-12-14 ENCOUNTER — Ambulatory Visit: Payer: Medicaid Other | Admitting: Pain Medicine

## 2018-12-14 ENCOUNTER — Other Ambulatory Visit: Payer: Self-pay

## 2018-12-14 VITALS — BP 170/93 | HR 74 | Temp 97.5°F | Resp 16 | Ht 68.0 in | Wt 200.0 lb

## 2018-12-14 DIAGNOSIS — M5412 Radiculopathy, cervical region: Secondary | ICD-10-CM

## 2018-12-14 DIAGNOSIS — M4802 Spinal stenosis, cervical region: Secondary | ICD-10-CM | POA: Diagnosis not present

## 2018-12-14 DIAGNOSIS — M5010 Cervical disc disorder with radiculopathy, unspecified cervical region: Secondary | ICD-10-CM | POA: Diagnosis not present

## 2018-12-14 DIAGNOSIS — M542 Cervicalgia: Secondary | ICD-10-CM | POA: Insufficient documentation

## 2018-12-14 DIAGNOSIS — M4722 Other spondylosis with radiculopathy, cervical region: Secondary | ICD-10-CM | POA: Diagnosis not present

## 2018-12-14 DIAGNOSIS — M5030 Other cervical disc degeneration, unspecified cervical region: Secondary | ICD-10-CM | POA: Insufficient documentation

## 2018-12-14 MED ORDER — LIDOCAINE HCL 2 % IJ SOLN
20.0000 mL | Freq: Once | INTRAMUSCULAR | Status: AC
  Administered 2018-12-14: 400 mg
  Filled 2018-12-14: qty 20

## 2018-12-14 MED ORDER — ROPIVACAINE HCL 2 MG/ML IJ SOLN
1.0000 mL | Freq: Once | INTRAMUSCULAR | Status: AC
  Administered 2018-12-14: 10 mL via EPIDURAL
  Filled 2018-12-14: qty 10

## 2018-12-14 MED ORDER — MIDAZOLAM HCL 5 MG/5ML IJ SOLN
1.0000 mg | INTRAMUSCULAR | Status: DC | PRN
  Administered 2018-12-14: 2 mg via INTRAVENOUS
  Filled 2018-12-14: qty 5

## 2018-12-14 MED ORDER — DEXAMETHASONE SODIUM PHOSPHATE 10 MG/ML IJ SOLN
10.0000 mg | Freq: Once | INTRAMUSCULAR | Status: AC
  Administered 2018-12-14: 10 mg
  Filled 2018-12-14: qty 1

## 2018-12-14 MED ORDER — FENTANYL CITRATE (PF) 100 MCG/2ML IJ SOLN
25.0000 ug | INTRAMUSCULAR | Status: DC | PRN
  Administered 2018-12-14: 50 ug via INTRAVENOUS
  Filled 2018-12-14: qty 2

## 2018-12-14 MED ORDER — IOHEXOL 180 MG/ML  SOLN
10.0000 mL | Freq: Once | INTRAMUSCULAR | Status: AC
  Administered 2018-12-14: 10 mL via EPIDURAL
  Filled 2018-12-14: qty 20

## 2018-12-14 MED ORDER — SODIUM CHLORIDE 0.9% FLUSH
1.0000 mL | Freq: Once | INTRAVENOUS | Status: AC
  Administered 2018-12-14: 10 mL

## 2018-12-14 MED ORDER — LACTATED RINGERS IV SOLN
1000.0000 mL | Freq: Once | INTRAVENOUS | Status: AC
  Administered 2018-12-14: 1000 mL via INTRAVENOUS

## 2018-12-14 NOTE — Progress Notes (Signed)
Safety precautions to be maintained throughout the outpatient stay will include: orient to surroundings, keep bed in low position, maintain call bell within reach at all times, provide assistance with transfer out of bed and ambulation.  

## 2018-12-14 NOTE — Patient Instructions (Signed)

## 2018-12-14 NOTE — Progress Notes (Signed)
Patient's Name: Caleb [name]  MRN: [id number]  Referring Provider: Virginia Crews, MD  DOB: 1955/10/09  PCP: Virginia Crews, MD  DOS: 12/14/2018  Note by: Gaspar Cola, MD  Service setting: Ambulatory outpatient  Specialty: Interventional Pain Management  Patient type: Established  Location: [facility] (AMB) Pain Management Facility  Visit type: Interventional Procedure   Primary Reason for Visit: Interventional Pain Management Treatment. CC: Neck Pain  Procedure:          Anesthesia, Analgesia, Anxiolysis:  Type: Palliative, Inter-Laminar, Cervical Epidural Steroid Injection  #4  Region: Posterior Cervico-thoracic Region Level: C7-T1 Laterality: Left-Sided Paramedial  Type: Moderate (Conscious) Sedation combined with Local Anesthesia Indication(s): Analgesia and Anxiety Route: Intravenous (IV) IV Access: Secured Sedation: Meaningful verbal contact was maintained at all times during the procedure  Local Anesthetic: Lidocaine 1-2%  Position: Prone with head of the table was raised to facilitate breathing.   Indications: 1. Cervicalgia (Bilateral) (L>R)   2. DDD (degenerative disc disease), cervical   3. 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 Cervical disc disorder with radiculopathy of cervical region   5. 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 Cervical spondylosis w/ radiculopathy    Pain Score: Pre-procedure: 5 /10 Post-procedure: 0-No pain/10   Pre-op Assessment:  Caleb [name] is a 63 y.o. (year old), male patient, seen today for interventional treatment. He  has a past surgical history that includes Cardiac catheterization (2015); Lumbar laminectomy (1989, 1999); Cervical fusion (1988, 1998); Liver surgery (2015); Partial colectomy (1990); Inguinal hernia repair (Bilateral, 1975); Lithotripsy; and Colonoscopy with propofol (N/A, 02/24/2017). Caleb [name] has a current medication list which includes the following  prescription(s): anoro ellipta, aspirin ec, cetirizine, fluticasone, [START ON 12/19/2018] hydrocodone-acetaminophen, [START ON 01/18/2019] hydrocodone-acetaminophen, [START ON 02/17/2019] hydrocodone-acetaminophen, isosorbide mononitrate, losartan, methocarbamol, mometasone, naloxone, neomycin-polymyxin-hydrocortisone, nitroglycerin, [START ON 12/19/2018] pregabalin, rosuvastatin, secukinumab, sertraline, and tadalafil, and the following Facility-Administered Medications: fentanyl and midazolam. His primarily concern today is the Neck Pain  Initial Vital Signs:  Pulse/HCG Rate: 74ECG Heart Rate: 74 Temp: (!) 97.4 F (36.3 C) Resp: 16 BP: (!) 171/88 SpO2: 99 %  BMI: Estimated body mass index is 30.41 kg/m as calculated from the following:   Height as of this encounter: 5\' 8"  (1.727 m).   Weight as of this encounter: 200 lb (90.7 kg).  Risk Assessment: Allergies: Reviewed. He is allergic to lisinopril and oxycodone.  Allergy Precautions: None required Coagulopathies: Reviewed. None identified.  Blood-thinner therapy: None at this time Active Infection(s): Reviewed. None identified. Caleb [name] is afebrile  Site Confirmation: Caleb [name] was asked to confirm the procedure and laterality before marking the site Procedure checklist: Completed Consent: Before the procedure and under the influence of no sedative(s), amnesic(s), or anxiolytics, the patient was informed of the treatment options, risks and possible complications. To fulfill our ethical and legal obligations, as recommended by the American Medical Association's Code of Ethics, I have informed the patient of my clinical impression; the nature and purpose of the treatment or procedure; the risks, benefits, and possible complications of the intervention; the alternatives, including doing nothing; the risk(s) and benefit(s) of the alternative treatment(s) or procedure(s); and the risk(s) and benefit(s) of doing nothing. The patient was  provided information about the general risks and possible complications associated with the procedure. These may include, but are not limited to: failure to achieve desired goals, infection, bleeding, organ or nerve damage, allergic reactions, paralysis, and death. In addition, the patient was informed of  those risks and complications associated to Spine-related procedures, such as failure to decrease pain; infection (i.e.: Meningitis, epidural or intraspinal abscess); bleeding (i.e.: epidural hematoma, subarachnoid hemorrhage, or any other type of intraspinal or peri-dural bleeding); organ or nerve damage (i.e.: Any type of peripheral nerve, nerve root, or spinal cord injury) with subsequent damage to sensory, motor, and/or autonomic systems, resulting in permanent pain, numbness, and/or weakness of one or several areas of the body; allergic reactions; (i.e.: anaphylactic reaction); and/or death. Furthermore, the patient was informed of those risks and complications associated with the medications. These include, but are not limited to: allergic reactions (i.e.: anaphylactic or anaphylactoid reaction(s)); adrenal axis suppression; blood sugar elevation that in diabetics may result in ketoacidosis or comma; water retention that in patients with history of congestive heart failure may result in shortness of breath, pulmonary edema, and decompensation with resultant heart failure; weight gain; swelling or edema; medication-induced neural toxicity; particulate matter embolism and blood vessel occlusion with resultant organ, and/or nervous system infarction; and/or aseptic necrosis of one or more joints. Finally, the patient was informed that Medicine is not an exact science; therefore, there is also the possibility of unforeseen or unpredictable risks and/or possible complications that may result in a catastrophic outcome. The patient indicated having understood very clearly. We have given the patient no guarantees  and we have made no promises. Enough time was given to the patient to ask questions, all of which were answered to the patient's satisfaction. Caleb [name] has indicated that he wanted to continue with the procedure. Attestation: I, the ordering provider, attest that I have discussed with the patient the benefits, risks, side-effects, alternatives, likelihood of achieving goals, and potential problems during recovery for the procedure that I have provided informed consent. Date  Time: 12/14/2018  9:03 AM  Pre-Procedure Preparation:  Monitoring: As per clinic protocol. Respiration, ETCO2, SpO2, BP, heart rate and rhythm monitor placed and checked for adequate function Safety Precautions: Patient was assessed for positional comfort and pressure points before starting the procedure. Time-out: I initiated and conducted the "Time-out" before starting the procedure, as per protocol. The patient was asked to participate by confirming the accuracy of the "Time Out" information. Verification of the correct person, site, and procedure were performed and confirmed by me, the nursing staff, and the patient. "Time-out" conducted as per Joint Commission's Universal Protocol (UP.01.01.01). Time: [id number]  Description of Procedure:          Target Area: For Epidural Steroid injections the target is the interlaminar space, initially targeting the lower border of the superior vertebral body lamina. Approach: Paramedial approach. Area Prepped: Entire PosteriorCervical Region Prepping solution: DuraPrep (Iodine Povacrylex [0.7% available iodine] and Isopropyl Alcohol, 74% w/w) Safety Precautions: Aspiration looking for blood return was conducted prior to all injections. At no point did we inject any substances, as a needle was being advanced. No attempts were made at seeking any paresthesias. Safe injection practices and needle disposal techniques used. Medications properly checked for expiration dates. SDV (single dose vial)  medications used. Description of the Procedure: Protocol guidelines were followed. The procedure needle was introduced through the skin, ipsilateral to the reported pain, and advanced to the target area. Bone was contacted and the needle walked caudad, until the lamina was cleared. The epidural space was identified using "loss-of-resistance technique" with 2-3 ml of PF-NaCl (0.9% NSS), in a 5cc LOR glass syringe. Vitals:   12/14/18 1000 12/14/18 1006 12/14/18 1016 12/14/18 1026  BP: 118/81 (!) 144/77 (!) 147/77 Marland Kitchen)  170/93  Pulse:      Resp: 15 16 16 16   Temp:  98.1 F (36.7 C)  (!) 97.5 F (36.4 C)  SpO2: 97%  97% 97%  Weight:      Height:        Start Time: 0949 hrs. End Time: 0957 hrs. Materials:  Needle(s) Type: Epidural needle Gauge: 17G Length: 3.5-in Medication(s): Please see orders for medications and dosing details.  Imaging Guidance (Spinal):          Type of Imaging Technique: Fluoroscopy Guidance (Spinal) Indication(s): Assistance in needle guidance and placement for procedures requiring needle placement in or near specific anatomical locations not easily accessible without such assistance. Exposure Time: Please see nurses notes. Contrast: Before injecting any contrast, we confirmed that the patient did not have an allergy to iodine, shellfish, or radiological contrast. Once satisfactory needle placement was completed at the desired level, radiological contrast was injected. Contrast injected under live fluoroscopy. No contrast complications. See chart for type and volume of contrast used. Fluoroscopic Guidance: I was personally present during the use of fluoroscopy. "Tunnel Vision Technique" used to obtain the best possible view of the target area. Parallax error corrected before commencing the procedure. "Direction-depth-direction" technique used to introduce the needle under continuous pulsed fluoroscopy. Once target was reached, antero-posterior, oblique, and lateral  fluoroscopic projection used confirm needle placement in all planes. Images permanently stored in EMR. Interpretation: I personally interpreted the imaging intraoperatively. Adequate needle placement confirmed in multiple planes. Appropriate spread of contrast into desired area was observed. No evidence of afferent or efferent intravascular uptake. No intrathecal or subarachnoid spread observed. Permanent images saved into the patient's record.  Antibiotic Prophylaxis:   Anti-infectives (From admission, onward)   None     Indication(s): None identified  Post-operative Assessment:  Post-procedure Vital Signs:  Pulse/HCG Rate: 7471 Temp: (!) 97.5 F (36.4 C) Resp: 16 BP: (!) 170/93 SpO2: 97 %  EBL: None  Complications: No immediate post-treatment complications observed by team, or reported by patient.  Note: The patient tolerated the entire procedure well. A repeat set of vitals were taken after the procedure and the patient was kept under observation following institutional policy, for this type of procedure. Post-procedural neurological assessment was performed, showing return to baseline, prior to discharge. The patient was provided with post-procedure discharge instructions, including a section on how to identify potential problems. Should any problems arise concerning this procedure, the patient was given instructions to immediately contact us, at any time, without hesitation. In any case, we plan to contact the patient by telephone for a follow-up status report regarding this interventional procedure.  Comments:  No additional relevant information.  Plan of Care  Orders:  Orders Placed This Encounter  Procedures  . CESI (Today)    Procedure: Cervical Epidural Steroid Injection/Block Purpose: Palliative Indication(s): Radiculitis and cervicalgia associater with cervical degenerative disc disease.    Scheduling Instructions:     Level(s): C7-T1     Laterality: Left-sided      Sedation: Patient's choice.     Timeframe: Today    Order Specific Question:   Where will this procedure be performed?    Answer:   [facility]    Comments:   by Dr. Dossie Arbour  . Fluoro (C-Arm) (<60 min) (No Report)    Intraoperative interpretation by procedural physician at [place].    Standing Status:   Standing    Number of Occurrences:   1    Order Specific Question:  Reason for exam:    Answer:   Assistance in needle guidance and placement for procedures requiring needle placement in or near specific anatomical locations not easily accessible without such assistance.  . Consent: CESI    Nursing Order: Transcribe to consent form and obtain patient signature. Note: Always confirm laterality of pain with Mr. Lucken, before procedure.  Procedure: Cervical Epidural Steroid Injection (CESI) under fluoroscopic guidance  Indication/Reason: Cervicalgia (Neck Pain) with or without Cervical Radiculopathy/Radiculitis (Arm/Shoulder Pain, Numbness, and/or weakness), secondary to Cervical and/or Cervicothoracic Degenerative Disc Disease (DDD), with or without Intervertebral Disc Displacement (IVDD).  Provider Attestation: I, [place] Dossie Arbour, MD, (Pain Management Specialist), the physician/practitioner, attest that I have discussed with the patient the benefits, risks, side effects, alternatives, likelihood of achieving goals and potential problems during recovery for the procedure that I have provided informed consent.  Marland Kitchen Epidural Tray    Equipment required: Single use, disposable, "Epidural Tray" Epidural Catheter: NOT required    Standing Status:   Standing    Number of Occurrences:   1    Order Specific Question:   Specify    Answer:   Epidural Tray   Chronic Opioid Analgesic:  Hydrocodone/APAP 5/325 1 tablet every 6 hours (20 mg/day of hydrocodone).  (Unable to tolerate an oxycodone IR trial due to stomachaches, nausea, headaches, and excessive somnolence.) MME/day:  20 mg/day.   Medications ordered for procedure: Meds ordered this encounter  Medications  . iohexol (OMNIPAQUE) 180 MG/ML injection 10 mL    Must be Myelogram-compatible. If not available, you may substitute with a water-soluble, non-ionic, hypoallergenic, myelogram-compatible radiological contrast medium.  Marland Kitchen lidocaine (XYLOCAINE) 2 % (with pres) injection 400 mg  . lactated ringers infusion 1,000 mL  . midazolam (VERSED) 5 MG/5ML injection 1-2 mg    Make sure Flumazenil is available in the pyxis when using this medication. If oversedation occurs, administer 0.2 mg IV over 15 sec. If after 45 sec no response, administer 0.2 mg again over 1 min; may repeat at 1 min intervals; not to exceed 4 doses (1 mg)  . fentaNYL (SUBLIMAZE) injection 25-50 mcg    Make sure Narcan is available in the pyxis when using this medication. In the event of respiratory depression (RR< 8/min): Titrate NARCAN (naloxone) in increments of 0.1 to 0.2 mg IV at 2-3 minute intervals, until desired degree of reversal.  . sodium chloride flush (NS) 0.9 % injection 1 mL  . ropivacaine (PF) 2 mg/mL (0.2%) (NAROPIN) injection 1 mL  . dexamethasone (DECADRON) injection 10 mg   Medications administered: We administered iohexol, lidocaine, lactated ringers, midazolam, fentaNYL, sodium chloride flush, ropivacaine (PF) 2 mg/mL (0.2%), and dexamethasone.  See the medical record for exact dosing, route, and time of administration.  Follow-up plan:   Return in about 2 weeks (around 12/28/2018) for (VV), (PP).       Interventional management options: Considering:   Possible bilateral cervical facet RFA Diagnostic right ankle block Diagnosticright lumbar sympathetic block Diagnostic midlineTESI Diagnostic bilateral thoracic facet block Possible bilateral thoracic facet RFA   Palliative PRN treatment(s):   Palliative left CESI #4 Diagnostic bilateral cervical facet block#3 Palliative/diagnostic right-sidedCommon  Peroneal NB#3 Palliative right-sided common peroneal nerve RFA #2(last done on 06/21/2017)      Recent Visits Date Type Provider Dept  12/11/18 Telemedicine Milinda Pointer, [facility]  09/20/18 Office Visit Milinda Pointer, MD [facility]  Showing recent visits within past 90 days and meeting all other requirements   Today's Visits Date  Type Provider Dept  12/14/18 Procedure visit Milinda Pointer, MD [facility]  Showing today's visits and meeting all other requirements   Future Appointments Date Type Provider Dept  01/01/19 Appointment Milinda Pointer, [facility]  03/14/19 Appointment Milinda Pointer, MD [facility]  Showing future appointments within next 90 days and meeting all other requirements   Disposition: Discharge home  Discharge Date & Time: 12/14/2018; 1027 hrs.   Primary Care Physician: Virginia Crews, MD Location: [facility] Note by: Gaspar Cola, MD Date: 12/14/2018; Time: 12:30 PM  Disclaimer:  Medicine is not an Chief Strategy Officer. The only guarantee in medicine is that nothing is guaranteed. It is important to note that the decision to proceed with this intervention was based on the information collected from the patient. The Data and conclusions were drawn from the patient's questionnaire, the interview, and the physical examination. Because the information was provided in large part by the patient, it cannot be guaranteed that it has not been purposely or unconsciously manipulated. Every effort has been made to obtain as much relevant data as possible for this evaluation. It is important to note that the conclusions that lead to this procedure are derived in large part from the available data. Always take into account that the treatment will also be dependent on availability of resources and existing treatment guidelines, considered by other Pain  Management Practitioners as being common knowledge and practice, at the time of the intervention. For Medico-Legal purposes, it is also important to point out that variation in procedural techniques and pharmacological choices are the acceptable norm. The indications, contraindications, technique, and results of the above procedure should only be interpreted and judged by a Board-Certified Interventional Pain Specialist with extensive familiarity and expertise in the same exact procedure and technique.

## 2018-12-15 ENCOUNTER — Telehealth: Payer: Self-pay

## 2018-12-15 NOTE — Telephone Encounter (Signed)
Post procedure phone call.  Patient states he is doing ok.  

## 2018-12-18 ENCOUNTER — Ambulatory Visit (INDEPENDENT_AMBULATORY_CARE_PROVIDER_SITE_OTHER): Payer: Medicaid Other | Admitting: Family Medicine

## 2018-12-18 ENCOUNTER — Other Ambulatory Visit: Payer: Self-pay

## 2018-12-18 ENCOUNTER — Encounter: Payer: Self-pay | Admitting: Family Medicine

## 2018-12-18 VITALS — BP 155/82 | HR 81 | Temp 96.9°F | Resp 16 | Ht 68.0 in | Wt 203.0 lb

## 2018-12-18 DIAGNOSIS — R05 Cough: Secondary | ICD-10-CM | POA: Diagnosis not present

## 2018-12-18 DIAGNOSIS — E7800 Pure hypercholesterolemia, unspecified: Secondary | ICD-10-CM

## 2018-12-18 DIAGNOSIS — J449 Chronic obstructive pulmonary disease, unspecified: Secondary | ICD-10-CM

## 2018-12-18 DIAGNOSIS — Z0000 Encounter for general adult medical examination without abnormal findings: Secondary | ICD-10-CM | POA: Diagnosis not present

## 2018-12-18 DIAGNOSIS — I2510 Atherosclerotic heart disease of native coronary artery without angina pectoris: Secondary | ICD-10-CM

## 2018-12-18 DIAGNOSIS — F331 Major depressive disorder, recurrent, moderate: Secondary | ICD-10-CM

## 2018-12-18 DIAGNOSIS — F411 Generalized anxiety disorder: Secondary | ICD-10-CM

## 2018-12-18 DIAGNOSIS — R739 Hyperglycemia, unspecified: Secondary | ICD-10-CM

## 2018-12-18 DIAGNOSIS — I10 Essential (primary) hypertension: Secondary | ICD-10-CM | POA: Diagnosis not present

## 2018-12-18 DIAGNOSIS — Z720 Tobacco use: Secondary | ICD-10-CM

## 2018-12-18 DIAGNOSIS — R054 Cough syncope: Secondary | ICD-10-CM

## 2018-12-18 DIAGNOSIS — R55 Syncope and collapse: Secondary | ICD-10-CM

## 2018-12-18 NOTE — Patient Instructions (Addendum)
The CDC recommends two doses of Shingrix (the shingles vaccine) separated by 2 to 6 months for adults age 61 years and older. I recommend checking with your insurance plan regarding coverage for this vaccine.   Also ask the health department about the tetanus vaccine    Preventive Care 25-49 Years Old, Male Preventive care refers to lifestyle choices and visits with your health care provider that can promote health and wellness. This includes:  A yearly physical exam. This is also called an annual well check.  Regular dental and eye exams.  Immunizations.  Screening for certain conditions.  Healthy lifestyle choices, such as eating a healthy diet, getting regular exercise, not using drugs or products that contain nicotine and tobacco, and limiting alcohol use. What can I expect for my preventive care visit? Physical exam Your health care provider will check:  Height and weight. These may be used to calculate body mass index (BMI), which is a measurement that tells if you are at a healthy weight.  Heart rate and blood pressure.  Your skin for abnormal spots. Counseling Your health care provider may ask you questions about:  Alcohol, tobacco, and drug use.  Emotional well-being.  Home and relationship well-being.  Sexual activity.  Eating habits.  Work and work Statistician. What immunizations do I need?  Influenza (flu) vaccine  This is recommended every year. Tetanus, diphtheria, and pertussis (Tdap) vaccine  You may need a Td booster every 10 years. Varicella (chickenpox) vaccine  You may need this vaccine if you have not already been vaccinated. Zoster (shingles) vaccine  You may need this after age 76. Measles, mumps, and rubella (MMR) vaccine  You may need at least one dose of MMR if you were born in 1957 or later. You may also need a second dose. Pneumococcal conjugate (PCV13) vaccine  You may need this if you have certain conditions and were not  previously vaccinated. Pneumococcal polysaccharide (PPSV23) vaccine  You may need one or two doses if you smoke cigarettes or if you have certain conditions. Meningococcal conjugate (MenACWY) vaccine  You may need this if you have certain conditions. Hepatitis A vaccine  You may need this if you have certain conditions or if you travel or work in places where you may be exposed to hepatitis A. Hepatitis B vaccine  You may need this if you have certain conditions or if you travel or work in places where you may be exposed to hepatitis B. Haemophilus influenzae type b (Hib) vaccine  You may need this if you have certain risk factors. Human papillomavirus (HPV) vaccine  If recommended by your health care provider, you may need three doses over 6 months. You may receive vaccines as individual doses or as more than one vaccine together in one shot (combination vaccines). Talk with your health care provider about the risks and benefits of combination vaccines. What tests do I need? Blood tests  Lipid and cholesterol levels. These may be checked every 5 years, or more frequently if you are over 41 years old.  Hepatitis C test.  Hepatitis B test. Screening  Lung cancer screening. You may have this screening every year starting at age 50 if you have a 30-pack-year history of smoking and currently smoke or have quit within the past 15 years.  Prostate cancer screening. Recommendations will vary depending on your family history and other risks.  Colorectal cancer screening. All adults should have this screening starting at age 18 and continuing until age 48.  Your health care provider may recommend screening at age 38 if you are at increased risk. You will have tests every 1-10 years, depending on your results and the type of screening test.  Diabetes screening. This is done by checking your blood sugar (glucose) after you have not eaten for a while (fasting). You may have this done every 1-3  years.  Sexually transmitted disease (STD) testing. Follow these instructions at home: Eating and drinking  Eat a diet that includes fresh fruits and vegetables, whole grains, lean protein, and low-fat dairy products.  Take vitamin and mineral supplements as recommended by your health care provider.  Do not drink alcohol if your health care provider tells you not to drink.  If you drink alcohol: ? Limit how much you have to 0-2 drinks a day. ? Be aware of how much alcohol is in your drink. In the [place] one drink equals one 12 oz bottle of beer (355 mL), one 5 oz glass of wine (148 mL), or one 1 oz glass of hard liquor (44 mL). Lifestyle  Take daily care of your teeth and gums.  Stay active. Exercise for at least 30 minutes on 5 or more days each week.  Do not use any products that contain nicotine or tobacco, such as cigarettes, e-cigarettes, and chewing tobacco. If you need help quitting, ask your health care provider.  If you are sexually active, practice safe sex. Use a condom or other form of protection to prevent STIs (sexually transmitted infections).  Talk with your health care provider about taking a low-dose aspirin every day starting at age 71. What's next?  Go to your health care provider once a year for a well check visit.  Ask your health care provider how often you should have your eyes and teeth checked.  Stay up to date on all vaccines. This information is not intended to replace advice given to you by your health care provider. Make sure you discuss any questions you have with your health care provider. Document Released: 01/17/2015 Document Revised: 12/15/2017 Document Reviewed: 12/15/2017 Elsevier Patient Education  2020 [employer].
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
## 2018-12-19 ENCOUNTER — Telehealth: Payer: Self-pay

## 2018-12-19 LAB — HEMOGLOBIN A1C
Est. average glucose Bld gHb Est-mCnc: 120 mg/dL
Hgb A1c MFr Bld: 5.8 % — ABNORMAL HIGH (ref 4.8–5.6)

## 2018-12-19 LAB — LIPID PANEL
Chol/HDL Ratio: 2.6 ratio (ref 0.0–5.0)
Cholesterol, Total: 171 mg/dL (ref 100–199)
HDL: 67 mg/dL (ref >39)
LDL Chol Calc (NIH): 71 mg/dL (ref 0–99)
Triglycerides: 203 mg/dL — ABNORMAL HIGH (ref 0–149)
VLDL Cholesterol Cal: 33 mg/dL (ref 5–40)

## 2018-12-19 NOTE — Telephone Encounter (Signed)
-----   Message from Virginia Crews, MD sent at 12/19/2018 11:31 AM EST ----- Cholesterol well controlled.  Hemoglobin A1c, 3 month avg of blood sugars, is in prediabetic range.  In order to prevent progression to diabetes, recommend low carb diet and regular exercise

## 2018-12-19 NOTE — Telephone Encounter (Signed)
Patient advised as below.  

## 2018-12-20 ENCOUNTER — Ambulatory Visit: Payer: Medicaid Other | Admitting: Nurse Practitioner

## 2018-12-20 DIAGNOSIS — R7303 Prediabetes: Secondary | ICD-10-CM | POA: Insufficient documentation

## 2018-12-20 NOTE — Assessment & Plan Note (Signed)
Chronic and previously without angina Now having some chest pain, but it does not sound anginal from his description Did still encourage him to follow-up with his cardiologist Continue Imdur, Crestor, aspirin, losartan Not on a beta-blocker given his recurrent episodes of syncope

## 2018-12-20 NOTE — Assessment & Plan Note (Signed)
As above, previously well controlled Continue Zoloft 100 mg daily Currently grieving the death of his father At next visit, we will plan to taper and discontinue if doing well still Advised on cutting back on his alcohol intake

## 2018-12-20 NOTE — Assessment & Plan Note (Signed)
Chronic 3 to 5-minute discussion regarding the harmful effects of smoking, the benefits of cessation, and his chronic lung disease He has tried Chantix in the past, but he did not have success and he reached the insurance maximum of coverage He is currently precontemplative and his current grief is impeding his cessation At our follow-up visit, we will plan to address this further We could try Wellbutrin to help with quitting

## 2018-12-20 NOTE — Assessment & Plan Note (Signed)
Has previously been well controlled, but uncontrolled today I believe that his acute stress reaction and grief is contributing to this No changes to medications today Encouraged home monitoring Encourage Dash diet and exercise Reviewed recent metabolic panel

## 2018-12-20 NOTE — Assessment & Plan Note (Signed)
Previously well controlled Continue Crestor at current dose Reviewed recent CMP and lipid panel Goal LDL less than 70

## 2018-12-20 NOTE — Assessment & Plan Note (Signed)
Chronic and well-controlled currently Continue current medications Referral to a new pulmonologist as above

## 2018-12-20 NOTE — Assessment & Plan Note (Signed)
Followed by pulmonology Need to optimize his COPD treatment Referral to a new pulmonologist as his recently moved away

## 2018-12-20 NOTE — Assessment & Plan Note (Signed)
Chronic and well-controlled Has improved significantly with Zoloft Continue Zoloft 100 mg daily Currently experiencing grief of the loss of his father, but optimistic about his future without caregiver stress He would like to discontinue Zoloft, but I encouraged him to keep taking it at least for the next few months until we follow-up to get through this grieving. At next visit, if still well controlled, we will work on tapering and stopping Contracted for safety-no SI/HI

## 2018-12-20 NOTE — Assessment & Plan Note (Signed)
Noted on recent labs Check A1c 

## 2018-12-24 ENCOUNTER — Encounter: Payer: Self-pay | Admitting: Acute Care
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
## 2018-12-25 ENCOUNTER — Other Ambulatory Visit: Payer: Self-pay

## 2018-12-25 ENCOUNTER — Encounter: Payer: Self-pay | Admitting: Family

## 2018-12-25 ENCOUNTER — Encounter: Payer: Self-pay | Admitting: Acute Care

## 2018-12-25 ENCOUNTER — Ambulatory Visit (INDEPENDENT_AMBULATORY_CARE_PROVIDER_SITE_OTHER): Payer: Medicaid Other | Admitting: Acute Care

## 2018-12-25 ENCOUNTER — Ambulatory Visit (INDEPENDENT_AMBULATORY_CARE_PROVIDER_SITE_OTHER): Payer: Medicaid Other | Admitting: Family

## 2018-12-25 VITALS — BP 120/86 | HR 70 | Temp 97.2°F | Ht 68.0 in | Wt 200.0 lb

## 2018-12-25 VITALS — BP 120/86 | HR 75 | Ht 68.0 in | Wt 200.5 lb

## 2018-12-25 DIAGNOSIS — R55 Syncope and collapse: Secondary | ICD-10-CM | POA: Diagnosis not present

## 2018-12-25 DIAGNOSIS — R5381 Other malaise: Secondary | ICD-10-CM | POA: Diagnosis not present

## 2018-12-25 DIAGNOSIS — F4321 Adjustment disorder with depressed mood: Secondary | ICD-10-CM | POA: Diagnosis not present

## 2018-12-25 DIAGNOSIS — F17210 Nicotine dependence, cigarettes, uncomplicated: Secondary | ICD-10-CM

## 2018-12-25 DIAGNOSIS — G4733 Obstructive sleep apnea (adult) (pediatric): Secondary | ICD-10-CM

## 2018-12-25 DIAGNOSIS — J449 Chronic obstructive pulmonary disease, unspecified: Secondary | ICD-10-CM

## 2018-12-25 DIAGNOSIS — Z720 Tobacco use: Secondary | ICD-10-CM

## 2018-12-25 DIAGNOSIS — I10 Essential (primary) hypertension: Secondary | ICD-10-CM

## 2018-12-25 DIAGNOSIS — I2510 Atherosclerotic heart disease of native coronary artery without angina pectoris: Secondary | ICD-10-CM

## 2018-12-25 DIAGNOSIS — Z0000 Encounter for general adult medical examination without abnormal findings: Secondary | ICD-10-CM

## 2018-12-25 DIAGNOSIS — E782 Mixed hyperlipidemia: Secondary | ICD-10-CM
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
## 2018-12-25 NOTE — Patient Instructions (Addendum)
Medication Instructions:  No medication changes today.  *If you need a refill on your cardiac medications before your next appointment, please call your pharmacy*  Lab Work: No lab work today.  If you have labs (blood work) drawn today and your tests are completely normal, you will receive your results only by: Marland Kitchen MyChart Message (if you have MyChart) OR . A paper copy in the mail If you have any lab test that is abnormal or we need to change your treatment, we will call you to review the results.  Testing/Procedures: You had an EKG today.  Follow-Up: At [facility], you and your health needs are our priority.  As part of our continuing mission to provide you with exceptional heart care, we have created designated Provider Care Teams.  These Care Teams include your primary Cardiologist (physician) and Advanced Practice Providers (APPs -  Physician Assistants and Nurse Practitioners) who all work together to provide you with the care you need, when you need it.  Your next appointment:   4 month(s)    The format for your next appointment:   In Person  Provider:    You may see Ida Rogue, MD or one of the following Advanced Practice Providers on your designated Care Team:    Murray Hodgkins, NP  Christell Faith, PA-C  Marrianne Mood, PA-C  Other Instructions  We are very sorry to hear about your father. We will be thinking of you during this time. We are proud of you for making changes to your diet and wanting to start exercising. These will both help strengthen and protect your heart. Glad you are following closely with Dr. Jacinto Reap and pulmonology. If hospice has the option of doing a grief support group or counseling, this is often very helpful and we would recommend it.   Your chest pain sounds as if stress is causing it. This is very common. It does not sound as if there are concerns for new blockages - typical anginal or "heart" pain occurs with activity and feels like  "pressure". You may use your nitroglycerin as-needed for this chest pain as it may help shorten the duration.   We are here if you need anything.

## 2018-12-25 NOTE — Patient Instructions (Addendum)
It was great to see you today Continue Anoro inhaler daily Rinse mouth after use Continue albuterol inhaler as needed for increased shortness of breath, wheezing, chest tightness, cough Note your daily symptoms > remember "red flags" for COPD:  Increase in cough, increase in sputum production, increase in shortness of breath or activity intolerance. If you notice these symptoms, please call to be seen so that we can be proactive in treatment. OSA We will work on getting you a new face mask for your BiPAP machine Continue on BiPAP at bedtime. You appear to be benefiting from the treatment  Goal is to wear for at least 6 hours each night for maximal clinical benefit. Continue to work on weight loss, as the link between excess weight  and sleep apnea is well established.   Remember to establish a good bedtime routine, and work on sleep hygiene.  Limit daytime naps , avoid stimulants such as caffeine and nicotine close to bedtime, exercise daily to promote sleep quality, avoid heavy , spicy, fried , or rich foods before bed. Ensure adequate exposure to natural light during the day,establish a relaxing bedtime routine with a pleasant sleep environment ( Bedroom between 60 and 67 degrees, turn off bright lights , TV or device screens screens , consider black out curtains or white noise machines) Do not drive if sleepy. Remember to clean mask, tubing, filter, and reservoir once weekly with soapy water.  Follow up with Dr. Mortimer Fries in   In 3 months after new mask is supplied and  With a down Load   Tobacco Abuse Plans to try Chantix again in 2 months through PCP Discussed reduce to quit. Needs Lung Cancer Screening but not covered by Medicaid Will need referral to screening program at 32 when Medicare kicks in for him.   Health Maintenance Call us if you decide you would like the the Flu vaccine States he will not take the COVID vaccine  Follow-up in 3-4 months with Dr, Mortimer Fries. Call sooner if  needed Please contact office for sooner follow up if symptoms do not improve or worsen or seek emergency care

## 2018-12-26 ENCOUNTER — Encounter: Payer: Self-pay | Admitting: Acute Care

## 2018-12-27 ENCOUNTER — Encounter: Payer: Self-pay | Admitting: Pain Medicine

## 2018-12-27 NOTE — Progress Notes (Signed)
Pain relief after procedure (treated area only): (Questions asked to patient) 1. Starting about 15 minutes after the procedure, and "while the area was still numb" (from the local anesthetics), were you having any of your usual pain "in that area" (the treated area)?  (NOTE: NOT including the discomfort from the needle sticks.) First 1 hour:100 % better. First 4-6 hours: 100 % better. 2. How long did the numbness from the local anesthetics last? (More than 6 hours?) Duration: 36 hours.  3. How much better is your pain now, when compared to before the procedure? Current benefit: 0 % better. 4. Can you move better now? Improvement in ROM (Range of Motion): No. 5. Can you do more now? Improvement in function: No. 4. Did you have any problems with the procedure? Side-effects/Complications: No.
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
## 2019-01-01 ENCOUNTER — Other Ambulatory Visit: Payer: Self-pay

## 2019-01-01 ENCOUNTER — Ambulatory Visit: Payer: Medicaid Other | Attending: Pain Medicine | Admitting: Pain Medicine

## 2019-01-01 DIAGNOSIS — M62838 Other muscle spasm: Secondary | ICD-10-CM | POA: Insufficient documentation

## 2019-01-01 DIAGNOSIS — G894 Chronic pain syndrome: Secondary | ICD-10-CM | POA: Diagnosis not present

## 2019-01-01 DIAGNOSIS — M542 Cervicalgia: Secondary | ICD-10-CM

## 2019-01-01 MED ORDER — HYDROCODONE-ACETAMINOPHEN 5-325 MG PO TABS: 1.0000 | ORAL_TABLET | Freq: Four times a day (QID) | ORAL | 0 refills | Status: DC | PRN

## 2019-01-01 NOTE — Patient Instructions (Signed)
____________________________________________________________________________________________  Preparing for your procedure (without sedation)  Procedure appointments are limited to planned procedures: . No Prescription Refills. . No disability issues will be discussed. . No medication changes will be discussed.  Instructions: . Oral Intake: Do not eat or drink anything for at least 3 hours prior to your procedure. . Transportation: Unless otherwise stated by your physician, you may drive yourself after the procedure. . Blood Pressure Medicine: Take your blood pressure medicine with a sip of water the morning of the procedure. . Blood thinners: Notify our staff if you are taking any blood thinners. Depending on which one you take, there will be specific instructions on how and when to stop it. . Diabetics on insulin: Notify the staff so that you can be scheduled 1st case in the morning. If your diabetes requires high dose insulin, take only  of your normal insulin dose the morning of the procedure and notify the staff that you have done so. . Preventing infections: Shower with an antibacterial soap the morning of your procedure.  . Build-up your immune system: Take 1000 mg of Vitamin C with every meal (3 times a day) the day prior to your procedure. . Antibiotics: Inform the staff if you have a condition or reason that requires you to take antibiotics before dental procedures. . Pregnancy: If you are pregnant, call and cancel the procedure. . Sickness: If you have a cold, fever, or any active infections, call and cancel the procedure. . Arrival: You must be in the facility at least 30 minutes prior to your scheduled procedure. . Children: Do not bring any children with you. . Dress appropriately: Bring dark clothing that you would not mind if they get stained. . Valuables: Do not bring any jewelry or valuables.  Reasons to call and reschedule or cancel your procedure: (Following these  recommendations will minimize the risk of a serious complication.) . Surgeries: Avoid having procedures within 2 weeks of any surgery. (Avoid for 2 weeks before or after any surgery). . Flu Shots: Avoid having procedures within 2 weeks of a flu shots or . (Avoid for 2 weeks before or after immunizations). . Barium: Avoid having a procedure within 7-10 days after having had a radiological study involving the use of radiological contrast. (Myelograms, Barium swallow or enema study). . Heart attacks: Avoid any elective procedures or surgeries for the initial 6 months after a "Myocardial Infarction" (Heart Attack). . Blood thinners: It is imperative that you stop these medications before procedures. Let us know if you if you take any blood thinner.  . Infection: Avoid procedures during or within two weeks of an infection (including chest colds or gastrointestinal problems). Symptoms associated with infections include: Localized redness, fever, chills, night sweats or profuse sweating, burning sensation when voiding, cough, congestion, stuffiness, runny nose, sore throat, diarrhea, nausea, vomiting, cold or Flu symptoms, recent or current infections. It is specially important if the infection is over the area that we intend to treat. . Heart and lung problems: Symptoms that may suggest an active cardiopulmonary problem include: cough, chest pain, breathing difficulties or shortness of breath, dizziness, ankle swelling, uncontrolled high or unusually low blood pressure, and/or palpitations. If you are experiencing any of these symptoms, cancel your procedure and contact your primary care physician for an evaluation.  Remember:  Regular Business hours are:  Monday to Thursday 8:00 AM to 4:00 PM  Provider's Schedule: Izick Gasbarro, MD:  Procedure days: Tuesday and Thursday 7:30 AM to 4:00 PM  Bilal   Lateef, MD:  Procedure days: Monday and Wednesday 7:30 AM to 4:00  PM ____________________________________________________________________________________________    

## 2019-01-09 ENCOUNTER — Telehealth: Payer: Self-pay | Admitting: Acute Care

## 2019-01-09 NOTE — Telephone Encounter (Signed)
CM sent to Darlina Guys and Dimas Chyle with [employer] as to the status of this order that was sent on 12/26/2018.  Waiting on response. Rhonda J Cobb

## 2019-01-09 NOTE — Telephone Encounter (Signed)
Message sent to Dimas Chyle with [employer]. Per Sonia Baller at [employer], [place], [place], [place]; Jonn Shingles L  I pulled the order and sent it to our Resupply team on 12/22. Our volume dramatically increased at the end of the year due to people wanting supplies/equipment by EOY due to deductible being met, so this may be on the burner. I will send another emailed to our Resupply team.        Spoke with patient and advised of the above. Pt advised to contact me back if he doesn't hear from [employer] or receive CPAP Mask. Pt voiced understanding and appreciated the return call. Nothing else needed at this time. Rhonda J Cobb

## 2019-01-09 NOTE — Telephone Encounter (Signed)
Order was placed on 12/25/2018 to [employer] for new mask.  Suanne Marker, can you help with this?

## 2019-01-10 DIAGNOSIS — G4733 Obstructive sleep apnea (adult) (pediatric): Secondary | ICD-10-CM | POA: Diagnosis not present

## 2019-01-11 ENCOUNTER — Ambulatory Visit (HOSPITAL_BASED_OUTPATIENT_CLINIC_OR_DEPARTMENT_OTHER): Payer: Medicaid Other | Admitting: Pain Medicine

## 2019-01-11 ENCOUNTER — Other Ambulatory Visit: Payer: Self-pay

## 2019-01-11 ENCOUNTER — Ambulatory Visit
Admission: RE | Admit: 2019-01-11 | Discharge: 2019-01-11 | Disposition: A | Discharge status: Home / Self Care | Payer: Medicaid Other | Source: Ambulatory Visit | Attending: Pain Medicine | Admitting: Pain Medicine

## 2019-01-11 ENCOUNTER — Encounter: Payer: Self-pay | Admitting: Pain Medicine

## 2019-01-11 VITALS — BP 106/69 | HR 71 | Temp 98.2°F | Resp 18 | Ht 68.0 in | Wt 202.0 lb

## 2019-01-11 DIAGNOSIS — M62838 Other muscle spasm: Secondary | ICD-10-CM | POA: Diagnosis not present

## 2019-01-11 DIAGNOSIS — M542 Cervicalgia: Secondary | ICD-10-CM

## 2019-01-11 DIAGNOSIS — M5030 Other cervical disc degeneration, unspecified cervical region: Secondary | ICD-10-CM | POA: Diagnosis not present

## 2019-01-11 MED ORDER — LIDOCAINE HCL 2 % IJ SOLN
INTRAMUSCULAR | Status: AC
  Filled 2019-01-11: qty 20

## 2019-01-11 MED ORDER — ROPIVACAINE HCL 2 MG/ML IJ SOLN
INTRAMUSCULAR | Status: AC
  Filled 2019-01-11: qty 10

## 2019-01-11 MED ORDER — LIDOCAINE HCL 2 % IJ SOLN
20.0000 mL | Freq: Once | INTRAMUSCULAR | Status: AC
  Administered 2019-01-11: 400 mg

## 2019-01-11 MED ORDER — METHYLPREDNISOLONE ACETATE 80 MG/ML IJ SUSP
80.0000 mg | Freq: Once | INTRAMUSCULAR | Status: AC
  Administered 2019-01-11: 80 mg via INTRA_ARTICULAR

## 2019-01-11 MED ORDER — METHYLPREDNISOLONE ACETATE 80 MG/ML IJ SUSP
INTRAMUSCULAR | Status: AC
  Filled 2019-01-11: qty 1

## 2019-01-11 MED ORDER — ROPIVACAINE HCL 2 MG/ML IJ SOLN
9.0000 mL | Freq: Once | INTRAMUSCULAR | Status: AC
  Administered 2019-01-11: 9 mL

## 2019-01-11 NOTE — Progress Notes (Signed)
PROVIDER NOTE: Information contained herein reflects review and annotations entered in association with encounter. Interpretation of such information and data should be left to medically-trained personnel. Information provided to patient can be located elsewhere in the medical record under "Patient Instructions". Document created using STT-dictation technology, any transcriptional errors that may result from process are unintentional.   Patient's Name: Caleb [name]  MRN: [id number]  Referring Provider: Virginia Crews, MD  DOB: 04-29-1955  PCP: Virginia Crews, MD  DOS: 01/11/2019  Note by: Gaspar Cola, MD  Service setting: Ambulatory outpatient  Specialty: Interventional Pain Management  Patient type: Established  Location: [facility] (AMB) Pain Management Facility  Visit type: Interventional Procedure   Primary Reason for Visit: Interventional Pain Management Treatment. CC: Neck Pain  Procedure:          Anesthesia, Analgesia, Anxiolysis:  Type: Serratus posterior superior muscle Trigger Point Injection (1-2 muscle groups) #2  CPT: 20552 Primary Purpose: Therapeutic Region: Posterior Cervicothoracic Level: Cervical Target Area: Paraspinal C7 spinous process Trigger Point Approach: Percutaneous approach. Laterality: Midline Paraspinal  Type: Local Anesthesia Indication(s): Analgesia         Local Anesthetic: Lidocaine 1-2% Route: Infiltration (Milligan/IM) IV Access: Declined Sedation: Declined   Position: Prone   Indications: 1. Trigger point of neck   2. Cervical paraspinal muscle spasm   3. Cervicalgia (Bilateral) (L>R)   4. DDD (degenerative disc disease), cervical    Pain Score: Pre-procedure: 4 /10 Post-procedure: 0-No pain/10   Pre-op Assessment:  Mr. Hemp is a 64 y.o. (year old), male patient, seen today for interventional treatment. He  has a past surgical history that includes Cardiac catheterization (2015); Lumbar laminectomy (1989, 1999); Cervical fusion  (1988, 1998); Liver surgery (2015); Partial colectomy (1990); Inguinal hernia repair (Bilateral, 1975); Lithotripsy; and Colonoscopy with propofol (N/A, 02/24/2017). Mr. Poch has a current medication list which includes the following prescription(s): anoro ellipta, aspirin ec, cetirizine, fluticasone, [START ON 01/18/2019] hydrocodone-acetaminophen, [START ON 02/17/2019] hydrocodone-acetaminophen, [START ON 03/19/2019] hydrocodone-acetaminophen, isosorbide mononitrate, losartan, methocarbamol, mometasone, naloxone, neomycin-polymyxin-hydrocortisone, nitroglycerin, nutritional supplements, pregabalin, rosuvastatin, secukinumab, sertraline, and tadalafil. His primarily concern today is the Neck Pain  Initial Vital Signs:  Pulse/HCG Rate: 83  Temp: 98.2 F (36.8 C) Resp: 16 BP: 110/74 SpO2: 96 %  BMI: Estimated body mass index is 30.71 kg/m as calculated from the following:   Height as of this encounter: 5\' 8"  (1.727 m).   Weight as of this encounter: 202 lb (91.6 kg).  Risk Assessment: Allergies: Reviewed. He is allergic to lisinopril and oxycodone.  Allergy Precautions: None required Coagulopathies: Reviewed. None identified.  Blood-thinner therapy: None at this time Active Infection(s): Reviewed. None identified. Mr. Lottman is afebrile  Site Confirmation: Mr. Segel was asked to confirm the procedure and laterality before marking the site Procedure checklist: Completed Consent: Before the procedure and under the influence of no sedative(s), amnesic(s), or anxiolytics, the patient was informed of the treatment options, risks and possible complications. To fulfill our ethical and legal obligations, as recommended by the American Medical Association's Code of Ethics, I have informed the patient of my clinical impression; the nature and purpose of the treatment or procedure; the risks, benefits, and possible complications of the intervention; the alternatives, including doing nothing; the risk(s)  and benefit(s) of the alternative treatment(s) or procedure(s); and the risk(s) and benefit(s) of doing nothing. The patient was provided information about the general risks and possible complications associated with the procedure. These may include, but are not limited to: failure to  achieve desired goals, infection, bleeding, organ or nerve damage, allergic reactions, paralysis, and death. In addition, the patient was informed of those risks and complications associated to the procedure, such as failure to decrease pain; infection; bleeding; organ or nerve damage with subsequent damage to sensory, motor, and/or autonomic systems, resulting in permanent pain, numbness, and/or weakness of one or several areas of the body; allergic reactions; (i.e.: anaphylactic reaction); and/or death. Furthermore, the patient was informed of those risks and complications associated with the medications. These include, but are not limited to: allergic reactions (i.e.: anaphylactic or anaphylactoid reaction(s)); adrenal axis suppression; blood sugar elevation that in diabetics may result in ketoacidosis or comma; water retention that in patients with history of congestive heart failure may result in shortness of breath, pulmonary edema, and decompensation with resultant heart failure; weight gain; swelling or edema; medication-induced neural toxicity; particulate matter embolism and blood vessel occlusion with resultant organ, and/or nervous system infarction; and/or aseptic necrosis of one or more joints. Finally, the patient was informed that Medicine is not an exact science; therefore, there is also the possibility of unforeseen or unpredictable risks and/or possible complications that may result in a catastrophic outcome. The patient indicated having understood very clearly. We have given the patient no guarantees and we have made no promises. Enough time was given to the patient to ask questions, all of which were answered  to the patient's satisfaction. Mr. Trimmer has indicated that he wanted to continue with the procedure. Attestation: I, the ordering provider, attest that I have discussed with the patient the benefits, risks, side-effects, alternatives, likelihood of achieving goals, and potential problems during recovery for the procedure that I have provided informed consent. Date  Time: 01/11/2019  9:19 AM  Pre-Procedure Preparation:  Monitoring: As per clinic protocol. Respiration, ETCO2, SpO2, BP, heart rate and rhythm monitor placed and checked for adequate function Safety Precautions: Patient was assessed for positional comfort and pressure points before starting the procedure. Time-out: I initiated and conducted the "Time-out" before starting the procedure, as per protocol. The patient was asked to participate by confirming the accuracy of the "Time Out" information. Verification of the correct person, site, and procedure were performed and confirmed by me, the nursing staff, and the patient. "Time-out" conducted as per Joint Commission's Universal Protocol ([email or web address].01.01.01). Time: 0946  Description of Procedure:          Area Prepped: Entire             Region Prepping solution: DuraPrep (Iodine Povacrylex [0.7% available iodine] and Isopropyl Alcohol, 74% w/w) Safety Precautions: Aspiration looking for blood return was conducted prior to all injections. At no point did we inject any substances, as a needle was being advanced. No attempts were made at seeking any paresthesias. Safe injection practices and needle disposal techniques used. Medications properly checked for expiration dates. SDV (single dose vial) medications used. Description of the Procedure: Protocol guidelines were followed. The patient was placed in position over the fluoroscopy table. The target area was identified and the area prepped in the usual manner. Skin & deeper tissues infiltrated with local anesthetic. Appropriate amount of time allowed  to pass for local anesthetics to take effect. The procedure needles were then advanced to the target area. Proper needle placement secured. Negative aspiration confirmed. Solution injected in intermittent fashion, asking for systemic symptoms every 0.5cc of injectate. The needles were then removed and the area cleansed, making sure to leave some of the prepping solution back to take advantage of  its long term bactericidal properties.  Vitals:   01/11/19 0919 01/11/19 0944 01/11/19 0948 01/11/19 0950  BP: 110/74  106/69   Pulse: 83 69 67 71  Resp: 16 16 16 18   Temp: 98.2 F (36.8 C)     TempSrc: Temporal     SpO2: 96% 95% 96% 95%  Weight: 202 lb (91.6 kg)     Height: 5\' 8"  (1.727 m)       Start Time: 0946 hrs. End Time: 0950 hrs. Materials:  Needle(s) Type: Epidural needle Gauge: 20G Length: 3.5-in Medication(s): Please see orders for medications and dosing details.  Imaging Guidance:          Type of Imaging Technique: None used Indication(s): N/A Exposure Time: No patient exposure Contrast: None used. Fluoroscopic Guidance: N/A Ultrasound Guidance: N/A Interpretation: N/A  Antibiotic Prophylaxis:   Anti-infectives (From admission, onward)   None     Indication(s): None identified  Post-operative Assessment:  Post-procedure Vital Signs:  Pulse/HCG Rate: 71  Temp: 98.2 F (36.8 C) Resp: 18 BP: 106/69 SpO2: 95 %  EBL: None  Complications: No immediate post-treatment complications observed by team, or reported by patient.  Note: The patient tolerated the entire procedure well. A repeat set of vitals were taken after the procedure and the patient was kept under observation following institutional policy, for this type of procedure. Post-procedural neurological assessment was performed, showing return to baseline, prior to discharge. The patient was provided with post-procedure discharge instructions, including a section on how to identify potential problems. Should any  problems arise concerning this procedure, the patient was given instructions to immediately contact us, at any time, without hesitation. In any case, we plan to contact the patient by telephone for a follow-up status report regarding this interventional procedure.  Comments:  No additional relevant information.  Plan of Care  Orders:  Orders Placed This Encounter  Procedures  . MNB (Today)    Scheduling Instructions:     Area: Neck     Side: Midline     Sedation: None     Timeframe: Today    Order Specific Question:   Where will this procedure be performed?    Answer:   [facility]  . Fluoro (C-Arm) (<60 min) (No Report)    Intraoperative interpretation by procedural physician at [place].    Standing Status:   Standing    Number of Occurrences:   1    Order Specific Question:   Reason for exam:    Answer:   Assistance in needle guidance and placement for procedures requiring needle placement in or near specific anatomical locations not easily accessible without such assistance.  . Consent: MNB    Provider Attestation: I, Burt Dossie Arbour, MD, (Pain Management Specialist), the physician/practitioner, attest that I have discussed with the patient the benefits, risks, side effects, alternatives, likelihood of achieving goals and potential problems during recovery for the procedure that I have provided informed consent.    Scheduling Instructions:     Procedure: Myoneural Block (Trigger Point injection)     Indications: Musculoskeletal pain/myofascial pain secondary to trigger point     Note: Always confirm laterality of pain with Mr. Cannady, before procedure.     Transcribe to consent form and obtain patient signature.  Dwain Sarna Tray    Equipment required: Single use, disposable, "Block Tray"    Standing Status:   Standing    Number of Occurrences:   1    Order Specific Question:  Specify    Answer:   Block Tray   Chronic Opioid Analgesic:   Hydrocodone/APAP 5/325 1 tablet every 6 hours (20 mg/day of hydrocodone).  (Unable to tolerate an oxycodone IR trial due to stomachaches, nausea, headaches, and excessive somnolence.) MME/day: 20 mg/day.   Medications ordered for procedure: Meds ordered this encounter  Medications  . lidocaine (XYLOCAINE) 2 % (with pres) injection 400 mg  . methylPREDNISolone acetate (DEPO-MEDROL) injection 80 mg  . ropivacaine (PF) 2 mg/mL (0.2%) (NAROPIN) injection 9 mL   Medications administered: We administered lidocaine, methylPREDNISolone acetate, and ropivacaine (PF) 2 mg/mL (0.2%).  See the medical record for exact dosing, route, and time of administration.  Follow-up plan:   Return in about 2 weeks (around 01/25/2019) for (VV), (PP).       Interventional management options: Considering:   Possible bilateral cervical facet RFA Diagnostic right ankle block Diagnosticright lumbar sympathetic block Diagnostic midlineTESI Diagnostic bilateral thoracic facet block Possible bilateral thoracic facet RFA   Palliative PRN treatment(s):   Palliative left CESI #4 Diagnostic bilateral cervical facet block#3 Palliative/diagnostic right-sidedCommon Peroneal NB#3 Palliative right-sided common peroneal nerve RFA #2(last done on 06/21/2017)    Recent Visits Date Type Provider Dept  01/01/19 Telemedicine Milinda Pointer, [facility]  12/14/18 Procedure visit Milinda Pointer, MD [facility]  12/11/18 Telemedicine Milinda Pointer, MD [facility]  Showing recent visits within past 90 days and meeting all other requirements   Today's Visits Date Type Provider Dept  01/11/19 Procedure visit Milinda Pointer, MD [facility]  Showing today's visits and meeting all other requirements   Future Appointments Date Type Provider Dept  01/25/19 Appointment Milinda Pointer, MD [facility]  Showing future appointments within next  90 days and meeting all other requirements   Disposition: Discharge home  Discharge Date & Time: 01/11/2019; 1000 hrs.   Primary Care Physician: Virginia Crews, MD Location: [facility] Note by: Gaspar Cola, MD Date: 01/11/2019; Time: 10:10 AM  Disclaimer:  Medicine is not an Chief Strategy Officer. The only guarantee in medicine is that nothing is guaranteed. It is important to note that the decision to proceed with this intervention was based on the information collected from the patient. The Data and conclusions were drawn from the patient's questionnaire, the interview, and the physical examination. Because the information was provided in large part by the patient, it cannot be guaranteed that it has not been purposely or unconsciously manipulated. Every effort has been made to obtain as much relevant data as possible for this evaluation. It is important to note that the conclusions that lead to this procedure are derived in large part from the available data. Always take into account that the treatment will also be dependent on availability of resources and existing treatment guidelines, considered by other Pain Management Practitioners as being common knowledge and practice, at the time of the intervention. For Medico-Legal purposes, it is also important to point out that variation in procedural techniques and pharmacological choices are the acceptable norm. The indications, contraindications, technique, and results of the above procedure should only be interpreted and judged by a Board-Certified Interventional Pain Specialist with extensive familiarity and expertise in the same exact procedure and technique.

## 2019-01-11 NOTE — Progress Notes (Signed)
Safety precautions to be maintained throughout the outpatient stay will include: orient to surroundings, keep bed in low position, maintain call bell within reach at all times, provide assistance with transfer out of bed and ambulation.  

## 2019-01-11 NOTE — Patient Instructions (Addendum)
____________________________________________________________________________________________  Post-Procedure Discharge Instructions  Instructions:  Apply ice:   Purpose: This will minimize any swelling and discomfort after procedure.   When: Day of procedure, as soon as you get home.  How: Fill a plastic sandwich bag with crushed ice. Cover it with a small towel and apply to injection site.  How long: (15 min on, 15 min off) Apply for 15 minutes then remove x 15 minutes.  Repeat sequence on day of procedure, until you go to bed.  Apply heat:   Purpose: To treat any soreness and discomfort from the procedure.  When: Starting the next day after the procedure.  How: Apply heat to procedure site starting the day following the procedure.  How long: May continue to repeat daily, until discomfort goes away.  Food intake: Start with clear liquids (like water) and advance to regular food, as tolerated.   Physical activities: Keep activities to a minimum for the first 8 hours after the procedure. After that, then as tolerated.  Driving: If you have received any sedation, be responsible and do not drive. You are not allowed to drive for 24 hours after having sedation.  Blood thinner: (Applies only to those taking blood thinners) You may restart your blood thinner 6 hours after your procedure.  Insulin: (Applies only to Diabetic patients taking insulin) As soon as you can eat, you may resume your normal dosing schedule.  Infection prevention: Keep procedure site clean and dry. Shower daily and clean area with soap and water.  Post-procedure Pain Diary: Extremely important that this be done correctly and accurately. Recorded information will be used to determine the next step in treatment. For the purpose of accuracy, follow these rules:  Evaluate only the area treated. Do not report or include pain from an untreated area. For the purpose of this evaluation, ignore all other areas of pain,  except for the treated area.  After your procedure, avoid taking a long nap and attempting to complete the pain diary after you wake up. Instead, set your alarm clock to go off every hour, on the hour, for the initial 8 hours after the procedure. Document the duration of the numbing medicine, and the relief you are getting from it.  Do not go to sleep and attempt to complete it later. It will not be accurate. If you received sedation, it is likely that you were given a medication that may cause amnesia. Because of this, completing the diary at a later time may cause the information to be inaccurate. This information is needed to plan your care.  Follow-up appointment: Keep your post-procedure follow-up evaluation appointment after the procedure (usually 2 weeks for most procedures, 6 weeks for radiofrequencies). DO NOT FORGET to bring you pain diary with you.   Expect: (What should I expect to see with my procedure?)  From numbing medicine (AKA: Local Anesthetics): Numbness or decrease in pain. You may also experience some weakness, which if present, could last for the duration of the local anesthetic.  Onset: Full effect within 15 minutes of injected.  Duration: It will depend on the type of local anesthetic used. On the average, 1 to 8 hours.   From steroids (Applies only if steroids were used): Decrease in swelling or inflammation. Once inflammation is improved, relief of the pain will follow.  Onset of benefits: Depends on the amount of swelling present. The more swelling, the longer it will take for the benefits to be seen. In some cases, up to 10 days.    Duration: Steroids will stay in the system x 2 weeks. Duration of benefits will depend on multiple posibilities including persistent irritating factors.  Side-effects: If present, they may typically last 2 weeks (the duration of the steroids).  Frequent: Cramps (if they occur, drink Gatorade and take over-the-counter Magnesium 450-500 mg  once to twice a day); water retention with temporary weight gain; increases in blood sugar; decreased immune system response; increased appetite.  Occasional: Facial flushing (red, warm cheeks); mood swings; menstrual changes.  Uncommon: Long-term decrease or suppression of natural hormones; bone thinning. (These are more common with higher doses or more frequent use. This is why we prefer that our patients avoid having any injection therapies in other practices.)   Very Rare: Severe mood changes; psychosis; aseptic necrosis.  From procedure: Some discomfort is to be expected once the numbing medicine wears off. This should be minimal if ice and heat are applied as instructed.  Call if: (When should I call?)  You experience numbness and weakness that gets worse with time, as opposed to wearing off.  New onset bowel or bladder incontinence. (Applies only to procedures done in the spine)  Emergency Numbers:  Durning business hours (Monday - Thursday, 8:00 AM - 4:00 PM) (Friday, 9:00 AM - 12:00 Noon): [phone]  After hours: [phone]  NOTE: If you are having a problem and are unable connect with, or to talk to a provider, then go to your nearest urgent care or emergency department. If the problem is serious and urgent, please call 911. ____________________________________________________________________________________________   Pain Management Discharge Instructions  General Discharge Instructions :  If you need to reach your doctor call: Monday-Friday 8:00 am - 4:00 pm at [phone] or toll free [phone].  After clinic hours [phone] to have operator reach doctor.  Bring all of your medication bottles to all your appointments in the pain clinic.  To cancel or reschedule your appointment with Pain Management please remember to call 24 hours in advance to avoid a fee.  Refer to the educational materials which you have been given on: General Risks, I had my  Procedure. Discharge Instructions, Post Sedation.  Post Procedure Instructions:  The drugs you were given will stay in your system until tomorrow, so for the next 24 hours you should not drive, make any legal decisions or drink any alcoholic beverages.  You may eat anything you prefer, but it is better to start with liquids then soups and crackers, and gradually work up to solid foods.  Please notify your doctor immediately if you have any unusual bleeding, trouble breathing or pain that is not related to your normal pain.  Depending on the type of procedure that was done, some parts of your body may feel week and/or numb.  This usually clears up by tonight or the next day.  Walk with the use of an assistive device or accompanied by an adult for the 24 hours.  You may use ice on the affected area for the first 24 hours.  Put ice in a Ziploc bag and cover with a towel and place against area 15 minutes on 15 minutes off.  You may switch to heat after 24 hours. Trigger Point Injection Trigger points are areas where you have pain. A trigger point injection is a shot given in the trigger point to help relieve pain for a few days to a few months. Common places for trigger points include:  The neck.  The shoulders.  The upper back.  The  lower back. A trigger point injection will not cure long-term (chronic) pain permanently. These injections do not always work for every person. For some people, they can help to relieve pain for a few days to a few months. Tell a health care provider about:  Any allergies you have.  All medicines you are taking, including vitamins, herbs, eye drops, creams, and over-the-counter medicines.  Any problems you or family members have had with anesthetic medicines.  Any blood disorders you have.  Any surgeries you have had.  Any medical conditions you have. What are the risks? Generally, this is a safe procedure. However, problems may occur,  including:  Infection.  Bleeding or bruising.  Allergic reaction to the injected medicine.  Irritation of the skin around the injection site. What happens before the procedure? Ask your health care provider about:  Changing or stopping your regular medicines. This is especially important if you are taking diabetes medicines or blood thinners.  Taking medicines such as aspirin and ibuprofen. These medicines can thin your blood. Do not take these medicines unless your health care provider tells you to take them.  Taking over-the-counter medicines, vitamins, herbs, and supplements. What happens during the procedure?   Your health care provider will feel for trigger points. A marker may be used to circle the area for the injection.  The skin over the trigger point will be washed with a germ-killing (antiseptic) solution.  A thin needle is used for the injection. You may feel pain or a twitching feeling when the needle enters the trigger point.  A numbing solution may be injected into the trigger point. Sometimes a medicine to keep down inflammation is also injected.  Your health care provider may move the needle around the area where the trigger point is located until the tightness and twitching goes away.  After the injection, your health care provider may put gentle pressure over the injection site.  The injection site will be covered with a bandage (dressing). The procedure may vary among health care providers and hospitals. What can I expect after treatment? After treatment, you may have:  Soreness and stiffness for 1-2 days.  A dressing. This can be taken off in a few hours or as told by your health care provider. Follow these instructions at home: Injection site care  Remove your dressing as told by your health care provider.  Check your injection site every day for signs of infection. Check for: ? Redness, swelling, or pain. ? Fluid or blood. ? Warmth. ? Pus or a  bad smell. Managing pain, stiffness, and swelling  If directed, put ice on the affected area. ? Put ice in a plastic bag. ? Place a towel between your skin and the bag. ? Leave the ice on for 20 minutes, 2-3 times a day. General instructions  If you were asked to stop your regular medicines, ask your health care provider when you may start taking them again.  Return to your normal activities as told by your health care provider. Ask your health care provider what activities are safe for you.  Do not take baths, swim, or use a hot tub until your health care provider approves.  You may be asked to see an occupational or physical therapist for exercises that reduce muscle strain and stretch the area of the trigger point.  Keep all follow-up visits as told by your health care provider. This is important. Contact a health care provider if:  Your pain comes back, and   it is worse than before the injection. You may need more injections.  You have chills or a fever.  The injection site becomes more painful, red, swollen, or warm to the touch. Summary  A trigger point injection is a shot given in the trigger point to help relieve pain for a few days to a few months.  Common places for trigger point injections are the neck, shoulder, upper back, and lower back.  These injections do not always work for every person, but for some people, the injections can help to relieve pain for a few days to a few months.  Contact a health care provider if symptoms come back or they are worse than before treatment. Also, get help if the injection site becomes more painful, red, swollen, or warm to the touch. This information is not intended to replace advice given to you by your health care provider. Make sure you discuss any questions you have with your health care provider. Document Revised: 02/01/2018 Document Reviewed: 02/01/2018 [employer] Patient Education  2020 [employer]  

## 2019-01-12 ENCOUNTER — Telehealth: Payer: Self-pay

## 2019-01-12 NOTE — Telephone Encounter (Signed)
Post procedure phone call.   Voicemail has not been set up yet.  Unable to leave a message.

## 2019-01-16 ENCOUNTER — Other Ambulatory Visit: Payer: Self-pay | Admitting: Family Medicine

## 2019-01-24 ENCOUNTER — Encounter: Payer: Self-pay | Admitting: Pain Medicine

## 2019-01-24 NOTE — Progress Notes (Signed)
Pain relief after procedure (treated area only): (Questions asked to patient) 1. Starting about 15 minutes after the procedure, and "while the area was still numb" (from the local anesthetics), were you having any of your usual pain "in that area" (the treated area)?  (NOTE: NOT including the discomfort from the needle sticks.) First 1 hour: 100 % better. First 4-6 hours: 100 % better. 2. How long did the numbness from the local anesthetics last? (More than 6 hours?) Duration: 24 hours.  3. How much better is your pain now, when compared to before the procedure? Current benefit: 100 % better. 4. Can you move better now? Improvement in ROM (Range of Motion): Yes. 5. Can you do more now? Improvement in function: Yes. 4. Did you have any problems with the procedure? Side-effects/Complications: No.

## 2019-01-25 ENCOUNTER — Ambulatory Visit: Payer: Medicaid Other | Attending: Pain Medicine | Admitting: Pain Medicine

## 2019-01-25 ENCOUNTER — Other Ambulatory Visit: Payer: Self-pay

## 2019-01-25 DIAGNOSIS — G894 Chronic pain syndrome: Secondary | ICD-10-CM

## 2019-01-25 DIAGNOSIS — M25571 Pain in right ankle and joints of right foot: Secondary | ICD-10-CM

## 2019-01-25 DIAGNOSIS — M79671 Pain in right foot: Secondary | ICD-10-CM

## 2019-01-25 DIAGNOSIS — M542 Cervicalgia: Secondary | ICD-10-CM

## 2019-01-25 DIAGNOSIS — M62838 Other muscle spasm: Secondary | ICD-10-CM

## 2019-01-25 DIAGNOSIS — G8929 Other chronic pain: Secondary | ICD-10-CM

## 2019-01-25 MED ORDER — HYDROCODONE-ACETAMINOPHEN 5-325 MG PO TABS: 1.0000 | ORAL_TABLET | Freq: Four times a day (QID) | ORAL | 0 refills | Status: DC | PRN

## 2019-01-25 NOTE — Progress Notes (Signed)
Patient: Caleb [name]  Service Category: E/M  Provider: Gaspar Cola, MD  DOB: 1955/10/15  DOS: 01/25/2019  Location: Office  MRN: [id number]  Setting: Ambulatory outpatient  Referring Provider: Virginia Crews, MD  Type: Established Patient  Specialty: Interventional Pain Management  PCP: Virginia Crews, MD  Location: Remote location  Delivery: TeleHealth     Virtual Encounter - Pain Management PROVIDER NOTE: Information contained herein reflects review and annotations entered in association with encounter. Interpretation of such information and data should be left to medically-trained personnel. Information provided to patient can be located elsewhere in the medical record under "Patient Instructions". Document created using STT-dictation technology, any transcriptional errors that may result from process are unintentional.    Contact & Pharmacy Preferred: [phone] Home: [phone] (home) Mobile: [phone] (mobile) E-mail: [email or web address]  [id number] [place], [place] [address] [place] [address] Phone: [phone] Fax: [phone]   Pre-screening  Caleb [name] offered "in-person" vs "virtual" encounter. He indicated preferring virtual for this encounter.   Reason COVID-19*  Social distancing based on CDC and AMA recommendations.   I contacted Caleb [name] on 01/25/2019 via telephone.      I clearly identified myself as Gaspar Cola, MD. I verified that I was speaking with the correct person using two identifiers (Name: Caleb [name], and date of birth: [birth year]).  Consent I sought verbal advanced consent from Caleb [name] for virtual visit interactions. I informed Caleb [name] of possible security and privacy concerns, risks, and limitations associated with providing "not-in-person" medical evaluation and management services. I also informed Caleb [name] of the availability of "in-person"  appointments. Finally, I informed him that there would be a charge for the virtual visit and that he could be  personally, fully or partially, financially responsible for it. Caleb [name] expressed understanding and agreed to proceed.   Historic Elements   Caleb [name] is a 64 y.o. year old, male patient evaluated today after his last encounter by our practice on 01/12/2019. Caleb [name]  has a past medical history of Chronic foot pain, right (2015), COPD (chronic obstructive pulmonary disease) ([place]), Coronary artery disease, Emphysema lung ([place]), Hyperlipidemia, Hypertension, Leucocytosis, Myocardial infarction ([place]) (2015), and OSA on CPAP. He also  has a past surgical history that includes Cardiac catheterization (2015); Lumbar laminectomy (1989, 1999); Cervical fusion (1988, 1998); Liver surgery (2015); Partial colectomy (1990); Inguinal hernia repair (Bilateral, 1975); Lithotripsy; and Colonoscopy with propofol (N/A, 02/24/2017). Caleb [name] has a current medication list which includes the following prescription(s): anoro ellipta, aspirin ec, cetirizine, fluticasone, hydrochlorothiazide, [START ON 02/17/2019] hydrocodone-acetaminophen, [START ON 03/19/2019] hydrocodone-acetaminophen, [START ON 04/18/2019] hydrocodone-acetaminophen, isosorbide mononitrate, losartan, methocarbamol, mometasone, naloxone, neomycin-polymyxin-hydrocortisone, nitroglycerin, nutritional supplements, pregabalin, rosuvastatin, secukinumab, sertraline, and tadalafil. He  reports that he has been smoking cigarettes. He has a 76.50 pack-year smoking history. He has never used smokeless tobacco. He reports current alcohol use of about 4.0 standard drinks of alcohol per week. He reports that he does not use drugs. Caleb [name] is allergic to lisinopril and oxycodone.   HPI  Today, he is being contacted for a post-procedure assessment.  He refers doing great after the procedure he is currently having absolutely no pain in the  area.  Post-Procedure Evaluation  Procedure (01/11/2019): Therapeutic midline serratus posterior superior muscle (C7 level) trigger point #2, no fluoroscopy or IV sedation Pre-procedure pain level:  4/10 Post-procedure: 0/10 (100% relief)  Sedation: None.  Janett Billow, RN  01/24/2019  9:22 AM  Sign when Signing Visit Pain relief after procedure (treated area only): (Questions asked to patient) 1. Starting about 15 minutes after the procedure, and "while the area was still numb" (from the local anesthetics), were you having any of your usual pain "in that area" (the treated area)?  (NOTE: NOT including the discomfort from the needle sticks.) First 1 hour: 100 % better. First 4-6 hours: 100 % better. 2. How long did the numbness from the local anesthetics last? (More than 6 hours?) Duration: 24 hours.  3. How much better is your pain now, when compared to before the procedure? Current benefit: 100 % better. 4. Can you move better now? Improvement in ROM (Range of Motion): Yes. 5. Can you do more now? Improvement in function: Yes. 4. Did you have any problems with the procedure? Side-effects/Complications: No.  Current benefits: Defined as benefit that persist at this time.   Analgesia:  90-100% better Function: Caleb [name] reports improvement in function ROM: Caleb [name] reports improvement in ROM  Pharmacotherapy Assessment  Analgesic: Hydrocodone/APAP 5/325 1 tablet every 6 hours (20 mg/day of hydrocodone).  (Unable to tolerate an oxycodone IR trial due to stomachaches, nausea, headaches, and excessive somnolence.) MME/day: 20 mg/day.   Monitoring: Pharmacotherapy: No side-effects or adverse reactions reported. [place] PMP: PDMP reviewed during this encounter.       Compliance: No problems identified. Effectiveness: Clinically acceptable. Plan: Refer to "POC".  UDS:  Summary  Date Value Ref Range Status  12/21/2017 FINAL  Final    Comment:     ==================================================================== TOXASSURE SELECT 13 (MW) ==================================================================== Test                             Result       Flag       Units Drug Present and Declared for Prescription Verification   Oxycodone                      276          EXPECTED   ng/mg creat   Oxymorphone                    1086         EXPECTED   ng/mg creat   Noroxycodone                   1214         EXPECTED   ng/mg creat   Noroxymorphone                 397          EXPECTED   ng/mg creat    Sources of oxycodone are scheduled prescription medications.    Oxymorphone, noroxycodone, and noroxymorphone are expected    metabolites of oxycodone. Oxymorphone is also available as a    scheduled prescription medication. Drug Absent but Declared for Prescription Verification   Hydrocodone                    Not Detected UNEXPECTED ng/mg creat ==================================================================== Test                      Result    Flag   Units      Ref Range   Creatinine              96  mg/dL      >=20 ==================================================================== Declared Medications:  The flagging and interpretation on this report are based on the  following declared medications.  Unexpected results may arise from  inaccuracies in the declared medications.  **Note: The testing scope of this panel includes these medications:  Hydrocodone (Norco)  Oxycodone  **Note: The testing scope of this panel does not include following  reported medications:  Acetaminophen (Norco)  Albuterol  Aspirin (Aspirin 81)  Fluticasone (Flovent)  Hydrochlorothiazide (Hydrodiuril)  Isosorbide (Imdur)  Losartan (Cozaar)  Methocarbamol (Robaxin)  Metoprolol (Lopressor)  Nitroglycerin (Nitrostat)  Pregabalin (Lyrica)  Rosuvastatin (Crestor)  Secukinumab  Tiotropium (Spiriva)  Triamcinolone  (Kenalog) ==================================================================== For clinical consultation, please call [phone]. ====================================================================    Laboratory Chemistry Profile (12 mo)  Renal: 03/15/2018: BUN/Creatinine Ratio 23 08/30/2018: BUN 22; Creatinine, Ser 0.78  Lab Results  Component Value Date   GFRAA >60 08/30/2018   GFRNONAA >60 08/30/2018   Hepatic: 08/30/2018: Albumin 3.8 Lab Results  Component Value Date   AST 22 08/30/2018   ALT 23 08/30/2018   Other: No results found for requested labs within last 8760 hours.  Note: Above Lab results reviewed.  Imaging  Fluoro (C-Arm) (<60 min) (No Report) Fluoro was used, but no Radiologist interpretation will be provided.  Please refer to "NOTES" tab for provider progress note.   Assessment  The primary encounter diagnosis was Trigger point of neck. Diagnoses of Cervical paraspinal muscle spasm, Cervicalgia (Bilateral) (L>R), Chronic foot pain (Primary Area of Pain) (Right), Chronic ankle pain (Secondary Area of Pain) (Right), and Chronic pain syndrome were also pertinent to this visit.  Plan of Care  Problem-specific:  No problem-specific Assessment & Plan notes found for this encounter.  I am having Caleb [name] maintain his aspirin EC, rosuvastatin, cetirizine, neomycin-polymyxin-hydrocortisone, fluticasone, isosorbide mononitrate, nitroGLYCERIN, naloxone, losartan, mometasone, Secukinumab (COSENTYX [place]), tadalafil, Anoro Ellipta, HYDROcodone-acetaminophen, methocarbamol, pregabalin, HYDROcodone-acetaminophen, Nutritional Supplements (KETO PO), sertraline, hydrochlorothiazide, and HYDROcodone-acetaminophen.  Pharmacotherapy (Medications Ordered): Meds ordered this encounter  Medications  . HYDROcodone-acetaminophen (NORCO/VICODIN) 5-325 MG tablet    Sig: Take 1 tablet by mouth every 6 (six) hours as needed for severe pain. Must last 30 days    Dispense:  120  tablet    Refill:  0    Chronic Pain: STOP Act (Not applicable) Fill 1 day early if closed on refill date. Do not fill until: 04/18/2019. To last until: 05/18/2019. Avoid benzodiazepines within 8 hours of opioids   Orders:  No orders of the defined types were placed in this encounter.  Follow-up plan:   Return in about 4 months (around 05/16/2019) for (VV), (MM).      Interventional management options: Considering:   Possible bilateral cervical facet RFA Diagnostic right ankle block Diagnosticright lumbar sympathetic block Diagnostic midlineTESI Diagnostic bilateral thoracic facet block Possible bilateral thoracic facet RFA   Palliative PRN treatment(s):   Palliative left CESI #4 Diagnostic bilateral cervical facet block#3 Palliative/diagnostic right-sidedCommon Peroneal NB#3 Palliative right-sided common peroneal nerve RFA #2(last done on 06/21/2017)    Recent Visits Date Type Provider Dept  01/11/19 Procedure visit Milinda Pointer, MD [facility]  01/01/19 Telemedicine Milinda Pointer, MD [facility]  12/14/18 Procedure visit Milinda Pointer, MD [facility]  12/11/18 Telemedicine Milinda Pointer, MD [facility]  Showing recent visits within past 90 days and meeting all other requirements   Today's Visits Date Type Provider Dept  01/25/19 Telemedicine Milinda Pointer, MD [facility]  Showing today's visits and meeting  all other requirements   Future Appointments Date Type Provider Dept  04/18/19 Appointment Milinda Pointer, MD [facility]  Showing future appointments within next 90 days and meeting all other requirements   I discussed the assessment and treatment plan with the patient. The patient was provided an opportunity to ask questions and all were answered. The patient agreed with the plan and demonstrated an understanding of the instructions.  Patient advised to call back  or seek an in-person evaluation if the symptoms or condition worsens.  Duration of encounter: 12 minutes.  Note by: Gaspar Cola, MD Date: 01/25/2019; Time: 12:06 PM

## 2019-01-29 ENCOUNTER — Other Ambulatory Visit: Payer: Self-pay | Admitting: Family Medicine

## 2019-02-05 DIAGNOSIS — G4733 Obstructive sleep apnea (adult) (pediatric): Secondary | ICD-10-CM | POA: Diagnosis not present

## 2019-02-26 ENCOUNTER — Other Ambulatory Visit: Payer: Self-pay | Admitting: Family Medicine

## 2019-02-26 NOTE — Telephone Encounter (Signed)
Appointment 03/19/19- refilled per protocol

## 2019-03-01 ENCOUNTER — Other Ambulatory Visit: Payer: Self-pay | Admitting: Family Medicine

## 2019-03-05 DIAGNOSIS — G4733 Obstructive sleep apnea (adult) (pediatric): Secondary | ICD-10-CM | POA: Diagnosis not present

## 2019-03-07 ENCOUNTER — Telehealth: Payer: Self-pay | Admitting: *Deleted

## 2019-03-07 ENCOUNTER — Encounter: Payer: Self-pay | Admitting: Pain Medicine

## 2019-03-07 ENCOUNTER — Other Ambulatory Visit: Payer: Self-pay

## 2019-03-07 ENCOUNTER — Ambulatory Visit: Payer: Medicaid Other | Attending: Pain Medicine | Admitting: Pain Medicine

## 2019-03-07 DIAGNOSIS — G5731 Lesion of lateral popliteal nerve, right lower limb: Secondary | ICD-10-CM

## 2019-03-07 DIAGNOSIS — M25571 Pain in right ankle and joints of right foot: Secondary | ICD-10-CM

## 2019-03-07 DIAGNOSIS — G8929 Other chronic pain: Secondary | ICD-10-CM | POA: Insufficient documentation

## 2019-03-07 DIAGNOSIS — S8411XS Injury of peroneal nerve at lower leg level, right leg, sequela: Secondary | ICD-10-CM | POA: Diagnosis not present

## 2019-03-07 DIAGNOSIS — M79604 Pain in right leg: Secondary | ICD-10-CM | POA: Diagnosis not present

## 2019-03-07 DIAGNOSIS — M25552 Pain in left hip: Secondary | ICD-10-CM | POA: Insufficient documentation

## 2019-03-07 DIAGNOSIS — M79671 Pain in right foot: Secondary | ICD-10-CM

## 2019-03-07 NOTE — Patient Instructions (Signed)

## 2019-03-07 NOTE — Progress Notes (Signed)
Patient: Caleb [name]  Service Category: E/M  Provider: Gaspar Cola, MD  DOB: [birth year]  DOS: 03/07/2019  Location: Office  MRN: [id number]  Setting: Ambulatory outpatient  Referring Provider: Virginia Crews, MD  Type: Established Patient  Specialty: Interventional Pain Management  PCP: Virginia Crews, MD  Location: Remote location  Delivery: TeleHealth     Virtual Encounter - Pain Management PROVIDER NOTE: Information contained herein reflects review and annotations entered in association with encounter. Interpretation of such information and data should be left to medically-trained personnel. Information provided to patient can be located elsewhere in the medical record under "Patient Instructions". Document created using STT-dictation technology, any transcriptional errors that may result from process are unintentional.    Contact & Pharmacy Preferred: [phone] Home: [phone] (home) Mobile: [phone] (mobile) E-mail: [email or web address]  [id number] [place]Phone: [phone]: [phone]  Pre-screening  Mr. HKimotooffered "in-person" vs "virtual" encounter. He indicated preferring virtual for this encounter.   Reason COVID-19*  Social distancing based on CDC and AMA recommendations.   I contacted RMerian Capronon 03/07/2019 via telephone.      I clearly identified myself as FGaspar Cola MD. I verified that I was speaking with the correct person using two identifiers (Name: RCAMERON SCHWINN and date of birth: 1Feb 02, 1957.  Consent I sought verbal advanced consent from [name] virtual visit interactions. I informed Mr. HTrappof possible security and privacy concerns, risks, and limitations associated with providing "not-in-person" medical evaluation and management services. I also informed Mr. HMainwaringof the availability of "in-person" appointments.  Finally, I informed him that there would be a charge for the virtual visit and that he could be  personally, fully or partially, financially responsible for it. Mr. HCayerexpressed understanding and agreed to proceed.   Historic Elements   Mr. RCOLLIS THEDEis a 64y.o. year old, male patient evaluated today after his last contact with our practice on 03/07/2019. Mr. HNiemann has a past medical history of Chronic foot pain, right (2015), COPD (chronic obstructive pulmonary disease) ([place], Coronary artery disease, Emphysema lung ([place], Hyperlipidemia, Hypertension, Leucocytosis, Myocardial infarction ([place] (2015), and OSA on CPAP. He also  has a past surgical history that includes Cardiac catheterization (2015); Lumbar laminectomy (1989, 1999); Cervical fusion (1988, 1998); Liver surgery (2015); Partial colectomy (1990); Inguinal hernia repair (Bilateral, 1975); Lithotripsy; and Colonoscopy with propofol (N/A, 02/24/2017). Mr. HKashubahas a current medication list which includes the following prescription(s): anoro ellipta, aspirin ec, cetirizine, fluticasone, hydrochlorothiazide, hydrocodone-acetaminophen, [START ON 03/19/2019] hydrocodone-acetaminophen, [START ON 04/18/2019] hydrocodone-acetaminophen, isosorbide mononitrate, losartan, methocarbamol, mometasone, naloxone, neomycin-polymyxin-hydrocortisone, nitroglycerin, nutritional supplements, pregabalin, rosuvastatin, secukinumab, sertraline, and tadalafil. He  reports that he has been smoking cigarettes. He has a 76.50 pack-year smoking history. He has never used smokeless tobacco. He reports current alcohol use of about 4.0 standard drinks of alcohol per week. He reports that he does not use drugs. Mr. HTrinkais allergic to lisinopril and oxycodone.   HPI  Today, he is being contacted for worsening of previously known (established) problem.  The patient indicated having sat on one of those massage chairs that will massage the legs.  Apparently after having  done that he started having an increase in his right leg pain from the knee down affecting his second, third, and fourth toe, to the point where he indicates that he is unable to walk or put any  weight on it.  More likely than not, the massage action of the chair occurred right over his superficial peroneal nerve, which we know he has a longstanding injury affecting it.  This is very likely to have irritated the area.  I will go ahead and bring him in to block the nerve and put some steroids in the area in the hopes that this will toned this pain down.  This plan was shared with the patient who understood and agree with it.  Pharmacotherapy Assessment  Analgesic: Hydrocodone/APAP 5/325 1 tablet every 6 hours (20 mg/day of hydrocodone).  (Unable to tolerate an oxycodone IR trial due to stomachaches, nausea, headaches, and excessive somnolence.) MME/day: 20 mg/day.   Monitoring: [place] PMP: PDMP reviewed during this encounter.       Pharmacotherapy: No side-effects or adverse reactions reported. Compliance: No problems identified. Effectiveness: Clinically acceptable. Plan: Refer to "POC".  UDS:  Summary  Date Value Ref Range Status  12/21/2017 FINAL  Final    Comment:    ==================================================================== TOXASSURE SELECT 13 (MW) ==================================================================== Test                             Result       Flag       Units Drug Present and Declared for Prescription Verification   Oxycodone                      276          EXPECTED   ng/mg creat   Oxymorphone                    1086         EXPECTED   ng/mg creat   Noroxycodone                   1214         EXPECTED   ng/mg creat   Noroxymorphone                 397          EXPECTED   ng/mg creat    Sources of oxycodone are scheduled prescription medications.    Oxymorphone, noroxycodone, and noroxymorphone are expected    metabolites of oxycodone. Oxymorphone is also  available as a    scheduled prescription medication. Drug Absent but Declared for Prescription Verification   Hydrocodone                    Not Detected UNEXPECTED ng/mg creat ==================================================================== Test                      Result    Flag   Units      Ref Range   Creatinine              96               mg/dL      >=20 ==================================================================== Declared Medications:  The flagging and interpretation on this report are based on the  following declared medications.  Unexpected results may arise from  inaccuracies in the declared medications.  **Note: The testing scope of this panel includes these medications:  Hydrocodone (Norco)  Oxycodone  **Note: The testing scope of this panel does not include following  reported medications:  Acetaminophen (Norco)  Albuterol  Aspirin (Aspirin 81)  Fluticasone (Flovent)  Hydrochlorothiazide (Hydrodiuril)  Isosorbide (Imdur)  Losartan (Cozaar)  Methocarbamol (Robaxin)  Metoprolol (Lopressor)  Nitroglycerin (Nitrostat)  Pregabalin (Lyrica)  Rosuvastatin (Crestor)  Secukinumab  Tiotropium (Spiriva)  Triamcinolone (Kenalog) ==================================================================== For clinical consultation, please call [phone]. ====================================================================    Laboratory Chemistry Profile   Renal Lab Results  Component Value Date   BUN 22 (H) 08/30/2018   CREATININE 0.78 08/30/2018   BCR 23 03/15/2018   GFRAA >60 08/30/2018   GFRNONAA >60 08/30/2018    Hepatic Lab Results  Component Value Date   AST 22 08/30/2018   ALT 23 08/30/2018   ALBUMIN 3.8 08/30/2018   ALKPHOS 71 08/30/2018    Electrolytes Lab Results  Component Value Date   NA 140 08/30/2018   K 4.0 08/30/2018   CL 105 08/30/2018   CALCIUM 9.0 08/30/2018   MG 2.0 05/30/2018    Bone Lab Results  Component Value Date    25OHVITD1 32 02/17/2017   25OHVITD2 <1.0 02/17/2017   25OHVITD3 32 02/17/2017   TESTOFREE 5.2 (L) 02/28/2017   TESTOSTERONE 279 02/28/2017    Inflammation (CRP: Acute Phase) (ESR: Chronic Phase) Lab Results  Component Value Date   CRP 8 11/07/2017   ESRSEDRATE 26 11/07/2017      Note: Above Lab results reviewed.  Imaging  Fluoro (C-Arm) (<60 min) (No Report) Fluoro was used, but no Radiologist interpretation will be provided.  Please refer to "NOTES" tab for provider progress note.  Assessment  The primary encounter diagnosis was Acute pain of right lower extremity. 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  Plan of Care  Problem-specific:  No problem-specific Assessment & Plan notes found for this encounter.  Mr. ZACHARIAS RIDLING has a current medication list which includes the following long-term medication(s): cetirizine, hydrocodone-acetaminophen, [START ON 03/19/2019] hydrocodone-acetaminophen, [START ON 04/18/2019] hydrocodone-acetaminophen, isosorbide mononitrate, losartan, methocarbamol, nitroglycerin, pregabalin, rosuvastatin, sertraline, and tadalafil.  Pharmacotherapy (Medications Ordered): No orders of the defined types were placed in this encounter.  Orders:  Orders Placed This Encounter  Procedures  . Misc procedure    Standing Status:   Future    Standing Expiration Date:   09/06/2020    Scheduling Instructions:     Type of Block: Right sided superficial peroneal nerve block with local anesthetic and steroids     Side: Right-sided     Sedation: With Sedation.     Timeframe: ASAP   Follow-up plan:   Return in about 1 day (around 03/08/2019) for Procedure (w/ sedation): (R) Superficial Peroneal NB.      Interventional management options: Considering:   Possible  bilateral cervical facet RFA Diagnostic right ankle block Diagnosticright lumbar sympathetic block Diagnostic midlineTESI Diagnostic bilateral thoracic facet block Possible bilateral thoracic facet RFA   Palliative PRN treatment(s):   Palliative left CESI #4 Diagnostic bilateral cervical facet block#3 Palliative/diagnostic right-sidedCommon Peroneal NB#3 Palliative right-sided common peroneal nerve RFA #2(last done on 06/21/2017)     Recent Visits Date Type Provider Dept  01/25/19 Telemedicine Milinda Pointer, [facility]  01/11/19 Procedure visit Milinda Pointer, MD [facility]  01/01/19 Telemedicine Milinda Pointer, MD [facility]  12/14/18 Procedure visit Milinda Pointer, MD [facility]  12/11/18 Telemedicine Milinda Pointer, MD [facility]  Showing recent visits within past 90 days and meeting all other requirements   Today's  Visits Date Type Provider Dept  03/07/19 Telemedicine Milinda Pointer, MD [facility]  Showing today's visits and meeting all other requirements   Future Appointments Date Type Provider Dept  03/08/19 Appointment Milinda Pointer, MD [facility]  05/16/19 Appointment Milinda Pointer, MD [facility]  Showing future appointments within next 90 days and meeting all other requirements   I discussed the assessment and treatment plan with the patient. The patient was provided an opportunity to ask questions and all were answered. The patient agreed with the plan and demonstrated an understanding of the instructions.  Patient advised to call back or seek an in-person evaluation if the symptoms or condition worsens.  Duration of encounter: 15 minutes.  Note by: Gaspar Cola, MD Date: 03/07/2019; Time: 2:11 PM

## 2019-03-08 ENCOUNTER — Encounter: Payer: Self-pay | Admitting: Pain Medicine

## 2019-03-08 ENCOUNTER — Other Ambulatory Visit: Payer: Self-pay | Admitting: Family Medicine

## 2019-03-08 ENCOUNTER — Other Ambulatory Visit: Payer: Self-pay

## 2019-03-08 ENCOUNTER — Ambulatory Visit
Admission: RE | Admit: 2019-03-08 | Discharge: 2019-03-08 | Disposition: A | Discharge status: Home / Self Care | Payer: Medicaid Other | Source: Ambulatory Visit | Attending: Pain Medicine | Admitting: Pain Medicine

## 2019-03-08 ENCOUNTER — Ambulatory Visit (HOSPITAL_BASED_OUTPATIENT_CLINIC_OR_DEPARTMENT_OTHER): Payer: Medicaid Other | Admitting: Pain Medicine

## 2019-03-08 ENCOUNTER — Telehealth (HOSPITAL_BASED_OUTPATIENT_CLINIC_OR_DEPARTMENT_OTHER): Payer: Medicaid Other | Admitting: Pain Medicine

## 2019-03-08 VITALS — BP 144/89 | HR 76 | Temp 98.1°F | Resp 20 | Ht 68.0 in | Wt 205.0 lb

## 2019-03-08 DIAGNOSIS — G8929 Other chronic pain: Secondary | ICD-10-CM | POA: Diagnosis not present

## 2019-03-08 DIAGNOSIS — S8411XS Injury of peroneal nerve at lower leg level, right leg, sequela: Secondary | ICD-10-CM

## 2019-03-08 DIAGNOSIS — M25571 Pain in right ankle and joints of right foot: Secondary | ICD-10-CM | POA: Insufficient documentation

## 2019-03-08 DIAGNOSIS — Z5329 Procedure and treatment not carried out because of patient's decision for other reasons: Secondary | ICD-10-CM

## 2019-03-08 DIAGNOSIS — M79604 Pain in right leg: Secondary | ICD-10-CM | POA: Diagnosis not present

## 2019-03-08 DIAGNOSIS — M79671 Pain in right foot: Secondary | ICD-10-CM

## 2019-03-08 DIAGNOSIS — M792 Neuralgia and neuritis, unspecified: Secondary | ICD-10-CM | POA: Insufficient documentation

## 2019-03-08 DIAGNOSIS — G5731 Lesion of lateral popliteal nerve, right lower limb: Secondary | ICD-10-CM

## 2019-03-08 DIAGNOSIS — G894 Chronic pain syndrome: Secondary | ICD-10-CM | POA: Diagnosis not present

## 2019-03-08 MED ORDER — ROPIVACAINE HCL 2 MG/ML IJ SOLN
9.0000 mL | Freq: Once | INTRAMUSCULAR | Status: AC
  Administered 2019-03-08: 9 mL via INTRA_ARTICULAR
  Filled 2019-03-08: qty 10

## 2019-03-08 MED ORDER — LIDOCAINE HCL 2 % IJ SOLN
20.0000 mL | Freq: Once | INTRAMUSCULAR | Status: AC
  Administered 2019-03-08: 400 mg
  Filled 2019-03-08: qty 20

## 2019-03-08 MED ORDER — TRIAMCINOLONE ACETONIDE 40 MG/ML IJ SUSP
40.0000 mg | Freq: Once | INTRAMUSCULAR | Status: AC
  Administered 2019-03-08: 40 mg
  Filled 2019-03-08: qty 1

## 2019-03-08 MED ORDER — HYDROCODONE-ACETAMINOPHEN 5-325 MG PO TABS: 1.0000 | ORAL_TABLET | Freq: Four times a day (QID) | ORAL | 0 refills | Status: DC | PRN

## 2019-03-08 MED ORDER — FENTANYL CITRATE (PF) 100 MCG/2ML IJ SOLN
25.0000 ug | INTRAMUSCULAR | Status: DC | PRN
  Administered 2019-03-08: 50 ug via INTRAVENOUS
  Filled 2019-03-08: qty 2

## 2019-03-08 MED ORDER — MIDAZOLAM HCL 5 MG/5ML IJ SOLN
1.0000 mg | INTRAMUSCULAR | Status: DC | PRN
  Administered 2019-03-08: 1 mg via INTRAVENOUS
  Filled 2019-03-08: qty 5

## 2019-03-08 NOTE — Patient Instructions (Signed)

## 2019-03-08 NOTE — Progress Notes (Signed)
PROVIDER NOTE: Information contained herein reflects review and annotations entered in association with encounter. Interpretation of such information and data should be left to medically-trained personnel. Information provided to patient can be located elsewhere in the medical record under "Patient Instructions". Document created using STT-dictation technology, any transcriptional errors that may result from process are unintentional.    Patient: Caleb [name]  Service Category: Procedure  Provider: Gaspar Cola, MD  DOB: [birth year]  DOS: 03/08/2019  Location: [facility]  MRN: [id number]  Setting: Ambulatory - outpatient  Referring Provider: Virginia Crews, MD  Type: Established Patient  Specialty: Interventional Pain Management  PCP: Virginia Crews, MD   Primary Reason for Visit: Interventional Pain Management Treatment. CC: Foot Pain  Procedure:          Anesthesia, Analgesia, Anxiolysis:  Type: Therapeutic Superficial Peroneal/Fibular (S-PN)  Nerve Block          Region: Anterolateral aspect of distal leg Level: Distal lower extremity. Laterality: Right-Side  Type: Moderate (Conscious) Sedation combined with Local Anesthesia Indication(s): Analgesia and Anxiety Local Anesthetic: Lidocaine 1-2% Route: Intravenous (IV) IV Access: Secured Sedation: Meaningful verbal contact was maintained at all times during the procedure   Position: Supine   Indications: 1. Acute pain of right lower extremity   2. 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 Disorder of superficial peroneal nerve (Right)   4. Neurogenic foot pain (Right)   5. Chronic foot pain (Primary Area of Pain) (Right)   6. Chronic ankle pain (Secondary Area of Pain) (Right)   7. Chronic pain syndrome    Pain Score: Pre-procedure: 5 /10 Post-procedure: 0-No pain/10   Pre-op Assessment:  Caleb [name] is a 64 y.o. (year old), male patient, seen today for interventional treatment. He  has  a past surgical history that includes Cardiac catheterization (2015); Lumbar laminectomy (1989, 1999); Cervical fusion (1988, 1998); Liver surgery (2015); Partial colectomy (1990); Inguinal hernia repair (Bilateral, 1975); Lithotripsy; and Colonoscopy with propofol (N/A, 02/24/2017). Caleb [name] has a current medication list which includes the following prescription(s): anoro ellipta, aspirin ec, cetirizine, fluticasone, hydrochlorothiazide, [START ON 03/19/2019] hydrocodone-acetaminophen, [START ON 04/18/2019] hydrocodone-acetaminophen, [START ON 05/18/2019] hydrocodone-acetaminophen, isosorbide mononitrate, losartan, methocarbamol, mometasone, naloxone, neomycin-polymyxin-hydrocortisone, nitroglycerin, nutritional supplements, pregabalin, rosuvastatin, secukinumab, sertraline, and tadalafil, and the following Facility-Administered Medications: fentanyl and midazolam. His primarily concern today is the Foot Pain  Initial Vital Signs:  Pulse/HCG Rate: 76ECG Heart Rate: 70 Temp: 98.4 F (36.9 C) Resp: 17 BP: 136/82 SpO2: 100 %  BMI: Estimated body mass index is 31.17 kg/m as calculated from the following:   Height as of this encounter: 5\' 8"  (1.727 m).   Weight as of this encounter: 205 lb (93 kg).  Risk Assessment: Allergies: Reviewed. He is allergic to lisinopril and oxycodone.  Allergy Precautions: None required Coagulopathies: Reviewed. None identified.  Blood-thinner therapy: None at this time Active Infection(s): Reviewed. None identified. [name] is afebrile  Site Confirmation: [name] was asked to confirm the procedure and laterality before marking the site Procedure checklist: Completed Consent: Before the procedure and under the influence of no sedative(s), amnesic(s), or anxiolytics, the patient was informed of the treatment options, risks and possible complications. To fulfill our ethical and legal obligations, as recommended by the American Medical Association's Code of Ethics, I  have informed the patient of my clinical impression; the nature and purpose of the treatment or procedure; the risks, benefits, and possible complications of the intervention; the alternatives, including doing nothing; the  risk(s) and benefit(s) of the alternative treatment(s) or procedure(s); and the risk(s) and benefit(s) of doing nothing. The patient was provided information about the general risks and possible complications associated with the procedure. These may include, but are not limited to: failure to achieve desired goals, infection, bleeding, organ or nerve damage, allergic reactions, paralysis, and death. In addition, the patient was informed of those risks and complications associated to the procedure, such as failure to decrease pain; infection; bleeding; organ or nerve damage with subsequent damage to sensory, motor, and/or autonomic systems, resulting in permanent pain, numbness, and/or weakness of one or several areas of the body; allergic reactions; (i.e.: anaphylactic reaction); and/or death. Furthermore, the patient was informed of those risks and complications associated with the medications. These include, but are not limited to: allergic reactions (i.e.: anaphylactic or anaphylactoid reaction(s)); adrenal axis suppression; blood sugar elevation that in diabetics may result in ketoacidosis or comma; water retention that in patients with history of congestive heart failure may result in shortness of breath, pulmonary edema, and decompensation with resultant heart failure; weight gain; swelling or edema; medication-induced neural toxicity; particulate matter embolism and blood vessel occlusion with resultant organ, and/or nervous system infarction; and/or aseptic necrosis of one or more joints. Finally, the patient was informed that Medicine is not an exact science; therefore, there is also the possibility of unforeseen or unpredictable risks and/or possible complications that may result in  a catastrophic outcome. The patient indicated having understood very clearly. We have given the patient no guarantees and we have made no promises. Enough time was given to the patient to ask questions, all of which were answered to the patient's satisfaction. Mr. Holaway has indicated that he wanted to continue with the procedure. Attestation: I, the ordering provider, attest that I have discussed with the patient the benefits, risks, side-effects, alternatives, likelihood of achieving goals, and potential problems during recovery for the procedure that I have provided informed consent. Date  Time: 03/08/2019  7:58 AM  Pre-Procedure Preparation:  Monitoring: As per clinic protocol. Respiration, ETCO2, SpO2, BP, heart rate and rhythm monitor placed and checked for adequate function Safety Precautions: Patient was assessed for positional comfort and pressure points before starting the procedure. Time-out: I initiated and conducted the "Time-out" before starting the procedure, as per protocol. The patient was asked to participate by confirming the accuracy of the "Time Out" information. Verification of the correct person, site, and procedure were performed and confirmed by me, the nursing staff, and the patient. "Time-out" conducted as per Joint Commission's Universal Protocol (UP.01.01.01). Time: [id number]  Description of Procedure:          Target Area: For the superficial peroneal [fibular] nerve, the target area is the lateral aspect of the distal lower extremity, above the lateral malleolus, as the nerve exits the crural fascia and the lateral compartment of the leg. Approach: Percutaneous approach. Area Prepped: Entire distal anterolateral lower extremity region Prepping solution: DuraPrep (Iodine Povacrylex [0.7% available iodine] and Isopropyl Alcohol, 74% w/w) Safety Precautions: Aspiration looking for blood return was conducted prior to all injections. At no point did we inject any substances, as a  needle was being advanced. No attempts were made at seeking any paresthesias. Safe injection practices and needle disposal techniques used. Medications properly checked for expiration dates. SDV (single dose vial) medications used. Description of the Procedure: Protocol guidelines were followed. The patient was placed in position. The target area was identified and the area prepped in the usual manner. Skin &  deeper tissues infiltrated with local anesthetic. Appropriate amount of time allowed to pass for local anesthetics to take effect. The procedure needles were then advanced to the target area. Proper needle placement secured. Negative aspiration confirmed. Solution injected in intermittent fashion, asking for systemic symptoms every 0.5cc of injectate. The needles were then removed and the area cleansed, making sure to leave some of the prepping solution back to take advantage of its long term bactericidal properties.  _   Vitals:   03/08/19 0904 03/08/19 0906 03/08/19 0916 03/08/19 0926  BP: 112/60 126/83 137/71 (!) 144/89  Pulse:      Resp: 16 17 20 20   Temp:  (!) 97.5 F (36.4 C)  98.1 F (36.7 C)  TempSrc:      SpO2: 98% 97% 97% 98%  Weight:      Height:        Start Time: 0854 hrs. End Time: 0902 hrs.  Materials:  Needle(s) Type: Spinal Needle Gauge: 25G Length: 1.5-in Medication(s): Please see orders for medications and dosing details.  Imaging Guidance (Non-Spinal):          Type of Imaging Technique: Fluoroscopy Guidance (Non-Spinal) Indication(s): Assistance in needle guidance and placement for procedures requiring needle placement in or near specific anatomical locations not easily accessible without such assistance. Exposure Time: Please see nurses notes. Contrast: None used. Fluoroscopic Guidance: I was personally present during the use of fluoroscopy. "Tunnel Vision Technique" used to obtain the best possible view of the target area. Parallax error corrected before  commencing the procedure. "Direction-depth-direction" technique used to introduce the needle under continuous pulsed fluoroscopy. Once target was reached, antero-posterior, oblique, and lateral fluoroscopic projection used confirm needle placement in all planes. Images permanently stored in EMR. Interpretation: No contrast injected. I personally interpreted the imaging intraoperatively. Adequate needle placement confirmed in multiple planes. Permanent images saved into the patient's record.  Antibiotic Prophylaxis:   Anti-infectives (From admission, onward)   None     Indication(s): None identified  Post-operative Assessment:  Post-procedure Vital Signs:  Pulse/HCG Rate: 7671 Temp: 98.1 F (36.7 C) Resp: 20 BP: (!) 144/89 SpO2: 98 %  EBL: None  Complications: No immediate post-treatment complications observed by team, or reported by patient.  Note: The patient tolerated the entire procedure well. A repeat set of vitals were taken after the procedure and the patient was kept under observation following institutional policy, for this type of procedure. Post-procedural neurological assessment was performed, showing return to baseline, prior to discharge. The patient was provided with post-procedure discharge instructions, including a section on how to identify potential problems. Should any problems arise concerning this procedure, the patient was given instructions to immediately contact us, at any time, without hesitation. In any case, we plan to contact the patient by telephone for a follow-up status report regarding this interventional procedure.  Comments:  No additional relevant information.  Plan of Care  Orders:  Orders Placed This Encounter  Procedures  . Misc procedure    Scheduling Instructions:     Type of Block: Right peroneal nerve block     Side: Right-sided     Sedation: With Sedation.     Timeframe: Today  . DG PAIN [facility] C-ARM 1-60 MIN NO REPORT     Intraoperative interpretation by procedural physician at [facility].    Standing Status:   Standing    Number of Occurrences:   1    Order Specific Question:   Reason for exam:    Answer:  Assistance in needle guidance and placement for procedures requiring needle placement in or near specific anatomical locations not easily accessible without such assistance.  . Provide equipment / supplies at bedside    Equipment required: Single use, disposable, "Block Tray"    Standing Status:   Standing    Number of Occurrences:   1    Order Specific Question:   Specify    Answer:   Block Tray  . Informed Consent Details: Physician/Practitioner Attestation; Transcribe to consent form and obtain patient signature    Provider Attestation: I, Biscoe Dossie Arbour, MD, (Pain Management Specialist), the physician/practitioner, attest that I have discussed with the patient the benefits, risks, side effects, alternatives, likelihood of achieving goals and potential problems during recovery for the procedure that I have provided informed consent.    Scheduling Instructions:     Procedure: Right peroneal nerve block     Indication/Reason: Acute distal right lower extremity pain     Note: Always confirm laterality of pain with Mr. Makowski, before procedure.   Chronic Opioid Analgesic:  Hydrocodone/APAP 5/325 1 tablet every 6 hours (20 mg/day of hydrocodone).  (Unable to tolerate an oxycodone IR trial due to stomachaches, nausea, headaches, and excessive somnolence.) MME/day: 20 mg/day.   Medications ordered for procedure: Meds ordered this encounter  Medications  . lidocaine (XYLOCAINE) 2 % (with pres) injection 400 mg  . midazolam (VERSED) 5 MG/5ML injection 1-2 mg    Make sure Flumazenil is available in the pyxis when using this medication. If oversedation occurs, administer 0.2 mg IV over 15 sec. If after 45 sec no response, administer 0.2 mg again over 1 min; may repeat at 1 min intervals; not to  exceed 4 doses (1 mg)  . fentaNYL (SUBLIMAZE) injection 25-50 mcg    Make sure Narcan is available in the pyxis when using this medication. In the event of respiratory depression (RR< 8/min): Titrate NARCAN (naloxone) in increments of 0.1 to 0.2 mg IV at 2-3 minute intervals, until desired degree of reversal.  . ropivacaine (PF) 2 mg/mL (0.2%) (NAROPIN) injection 9 mL  . triamcinolone acetonide (KENALOG-40) injection 40 mg  . HYDROcodone-acetaminophen (NORCO/VICODIN) 5-325 MG tablet    Sig: Take 1 tablet by mouth every 6 (six) hours as needed for severe pain. Must last 30 days    Dispense:  120 tablet    Refill:  0    Chronic Pain: STOP Act (Not applicable) Fill 1 day early if closed on refill date. Do not fill until: 05/18/2019. To last until: 06/17/2019. Avoid benzodiazepines within 8 hours of opioids   Medications administered: We administered lidocaine, midazolam, fentaNYL, ropivacaine (PF) 2 mg/mL (0.2%), and triamcinolone acetonide.  See the medical record for exact dosing, route, and time of administration.  Follow-up plan:   Return in about 2 weeks (around 03/22/2019) for (VV), (PP).       Interventional management options: Considering:   Possible bilateral cervical facet RFA Diagnostic right ankle block Diagnosticright lumbar sympathetic block Diagnostic midlineTESI Diagnostic bilateral thoracic facet block Possible bilateral thoracic facet RFA   Palliative PRN treatment(s):   Palliative left CESI #4 Diagnostic bilateral cervical facet block#3 Palliative/diagnostic right-sidedCommon Peroneal NB#3 Palliative right-sided common peroneal nerve RFA #2(last done on 06/21/2017)    Recent Visits Date Type Provider Dept  03/07/19 Telemedicine Milinda Pointer, [facility]  01/25/19 Telemedicine Milinda Pointer, MD [facility]  01/11/19 Procedure visit Milinda Pointer, MD [facility]  01/01/19 Telemedicine Milinda Pointer, MD [facility]  12/14/18 Procedure visit Milinda Pointer, MD [facility]  12/11/18 Telemedicine Milinda Pointer, MD [facility]  Showing recent visits within past 90 days and meeting all other requirements   Today's Visits Date Type Provider Dept  03/08/19 Procedure visit Milinda Pointer, MD [facility]  Showing today's visits and meeting all other requirements   Future Appointments Date Type Provider Dept  03/21/19 Appointment Milinda Pointer, MD [facility]  Showing future appointments within next 90 days and meeting all other requirements   Disposition: Discharge home  Discharge (Date  Time): 03/08/2019; 0933 hrs.   Primary Care Physician: Virginia Crews, MD Location: [facility] Note by: Gaspar Cola, MD Date: 03/08/2019; Time: 10:01 AM  Disclaimer:  Medicine is not an Chief Strategy Officer. The only guarantee in medicine is that nothing is guaranteed. It is important to note that the decision to proceed with this intervention was based on the information collected from the patient. The Data and conclusions were drawn from the patient's questionnaire, the interview, and the physical examination. Because the information was provided in large part by the patient, it cannot be guaranteed that it has not been purposely or unconsciously manipulated. Every effort has been made to obtain as much relevant data as possible for this evaluation. It is important to note that the conclusions that lead to this procedure are derived in large part from the available data. Always take into account that the treatment will also be dependent on availability of resources and existing treatment guidelines, considered by other Pain Management Practitioners as being common knowledge and practice, at the time of the intervention. For Medico-Legal purposes, it is also important to point out that variation in  procedural techniques and pharmacological choices are the acceptable norm. The indications, contraindications, technique, and results of the above procedure should only be interpreted and judged by a Board-Certified Interventional Pain Specialist with extensive familiarity and expertise in the same exact procedure and technique.

## 2019-03-08 NOTE — Telephone Encounter (Signed)
[facility] faxed refill request for the following medications:  isosorbide mononitrate (IMDUR) 30 MG 24 hr tablet  Last Rx: 07/26/2018 LOV: 12/18/2018 NOV: 03/19/2019 Please advise. Thanks TNP

## 2019-03-09 ENCOUNTER — Telehealth: Payer: Self-pay

## 2019-03-09 MED ORDER — ISOSORBIDE MONONITRATE ER 30 MG PO TB24: 15.0000 mg | ORAL_TABLET | Freq: Every day | ORAL | 5 refills | Status: DC

## 2019-03-09 NOTE — Telephone Encounter (Signed)
Post procedure phone call.  Patient states he is sore.  Instructed to use heat today and to call us for any problems.

## 2019-03-14 ENCOUNTER — Telehealth: Payer: Medicaid Other | Admitting: Pain Medicine

## 2019-03-19 ENCOUNTER — Other Ambulatory Visit: Payer: Self-pay

## 2019-03-19 ENCOUNTER — Encounter: Payer: Self-pay | Admitting: Family Medicine

## 2019-03-19 ENCOUNTER — Ambulatory Visit (INDEPENDENT_AMBULATORY_CARE_PROVIDER_SITE_OTHER): Payer: Medicaid Other | Admitting: Family Medicine

## 2019-03-19 VITALS — BP 131/83 | HR 74 | Temp 97.1°F | Resp 16 | Ht 68.0 in | Wt 205.0 lb

## 2019-03-19 DIAGNOSIS — F331 Major depressive disorder, recurrent, moderate: Secondary | ICD-10-CM

## 2019-03-19 DIAGNOSIS — R079 Chest pain, unspecified: Secondary | ICD-10-CM

## 2019-03-19 DIAGNOSIS — G4733 Obstructive sleep apnea (adult) (pediatric): Secondary | ICD-10-CM | POA: Diagnosis not present

## 2019-03-19 DIAGNOSIS — I2510 Atherosclerotic heart disease of native coronary artery without angina pectoris: Secondary | ICD-10-CM

## 2019-03-19 DIAGNOSIS — I10 Essential (primary) hypertension: Secondary | ICD-10-CM

## 2019-03-19 DIAGNOSIS — M79645 Pain in left finger(s): Secondary | ICD-10-CM

## 2019-03-19 DIAGNOSIS — B351 Tinea unguium: Secondary | ICD-10-CM | POA: Diagnosis not present

## 2019-03-19 NOTE — Assessment & Plan Note (Signed)
Well controlled Continue current medications Recheck metabolic panel F/u in 6 months  

## 2019-03-19 NOTE — Assessment & Plan Note (Signed)
As above, seems likely that this was related to not having his Imdur for angina Continue Imdur Nitro as needed Follow-up with cardiology if recurs Discussed strict emergent precautions

## 2019-03-19 NOTE — Assessment & Plan Note (Signed)
Chronic and well-controlled Has now tapered off of SSRI Continue to monitor

## 2019-03-19 NOTE — Assessment & Plan Note (Signed)
Chronic and previously without angina 2 episodes of chest pain in the last 2 weeks that are anginal from his description These were when he was without his Imdur, which she is now back on and no longer having chest pain EKG is unchanged from previous Continue Imdur Trial of nitroglycerin if he has recurrent chest pain Advised him to follow-up with his cardiologist if he has any more recurrent chest pain Discussed emergent precautions as well

## 2019-03-19 NOTE — Progress Notes (Signed)
Patient: Caleb [name] Male    DOB: 1955-05-07   64 y.o.   MRN: [id number] Visit Date: 03/19/2019  Today's Provider: Lavon Paganini, MD   Chief Complaint  Patient presents with  . Hypertension  . Chest Pain   Subjective:     HPI  Hypertension, follow-up:  BP Readings from Last 3 Encounters:  03/19/19 131/83  03/08/19 (!) 144/89  01/11/19 106/69    He was last seen for hypertension 3 months ago.  BP at that visit was 120/86. Management changes since that visit include no changes. He reports excellent compliance with treatment. He is not having side effects.  He is not exercising. He is not adherent to low salt diet.   Outside blood pressures are stable. He is experiencing chest pain.  Patient denies lower extremity edema and palpitations.   Cardiovascular risk factors include hypertension, male gender, obesity (BMI >= 30 kg/m2) and smoking/ tobacco exposure.  Use of agents associated with hypertension: none.     Weight trend: stable Wt Readings from Last 3 Encounters:  03/19/19 205 lb (93 kg)  03/08/19 205 lb (93 kg)  01/11/19 202 lb (91.6 kg)    Current diet: not asked  ------------------------------------------------------------------------  Reports substernal/left sided chest pressure, non exertional, x2 episodes ~2 weeks ago.  Subsided without intervention.  He was out of Imdur for ~1.5 weeks when these episodes occurred.  No current chest pain  Allergies  Allergen Reactions  . Lisinopril Cough  . Oxycodone Nausea And Vomiting     Current Outpatient Medications:  .  ANORO ELLIPTA 62.5-25 MCG/INH AEPB, TAKE 1 PUFF BY MOUTH EVERY DAY, Disp: 60 each, Rfl: 5 .  aspirin EC 81 MG tablet, Take 81 mg by mouth daily., Disp: , Rfl:  .  cetirizine (ZYRTEC) 10 MG tablet, TAKE 1 TABLET BY MOUTH EVERY DAY, Disp: 30 tablet, Rfl: 0 .  fluticasone (FLONASE) 50 MCG/ACT nasal spray, SPRAY 2 SPRAYS INTO EACH NOSTRIL EVERY DAY, Disp: , Rfl:  .   hydrochlorothiazide (HYDRODIURIL) 25 MG tablet, Take 25 mg by mouth daily., Disp: , Rfl:  .  HYDROcodone-acetaminophen (NORCO/VICODIN) 5-325 MG tablet, Take 1 tablet by mouth every 6 (six) hours as needed for severe pain. Must last 30 days, Disp: 120 tablet, Rfl: 0 .  isosorbide mononitrate (IMDUR) 30 MG 24 hr tablet, Take 0.5 tablets (15 mg total) by mouth daily., Disp: 15 tablet, Rfl: 5 .  losartan (COZAAR) 100 MG tablet, TAKE 1 TABLET BY MOUTH EVERY DAY, Disp: 90 tablet, Rfl: 0 .  methocarbamol (ROBAXIN) 750 MG tablet, Take 1 tablet (750 mg total) by mouth every 8 (eight) hours as needed for muscle spasms., Disp: 270 tablet, Rfl: 0 .  neomycin-polymyxin-hydrocortisone (CORTISPORIN) 3.5-10000-1 OTIC suspension, Place 3 drops into the right ear 4 (four) times daily. X 5-7 days, Disp: 10 mL, Rfl: 0 .  nitroGLYCERIN (NITROSTAT) 0.4 MG SL tablet, Place 1 tablet (0.4 mg total) under the tongue every 5 (five) minutes as needed for chest pain., Disp: 30 tablet, Rfl: 3 .  pregabalin (LYRICA) 150 MG capsule, Take 1 capsule (150 mg total) by mouth 2 (two) times daily., Disp: 60 capsule, Rfl: 5 .  rosuvastatin (CRESTOR) 40 MG tablet, TAKE 1 TABLET BY MOUTH EVERY DAY, Disp: 90 tablet, Rfl: 0 .  Secukinumab (COSENTYX [place]), Inject into the skin every 30 (thirty) days. , Disp: , Rfl:  .  tadalafil (CIALIS) 20 MG tablet, Take 1 tablet (20 mg total) by mouth daily  as needed for erectile dysfunction., Disp: 15 tablet, Rfl: 11 .  [START ON 04/18/2019] HYDROcodone-acetaminophen (NORCO/VICODIN) 5-325 MG tablet, Take 1 tablet by mouth every 6 (six) hours as needed for severe pain. Must last 30 days, Disp: 120 tablet, Rfl: 0 .  [START ON 05/18/2019] HYDROcodone-acetaminophen (NORCO/VICODIN) 5-325 MG tablet, Take 1 tablet by mouth every 6 (six) hours as needed for severe pain. Must last 30 days, Disp: 120 tablet, Rfl: 0 .  mometasone (ELOCON) 0.1 % lotion, APPLY 1 ML ONTO THE SKIN DAILY TO EARS FOR PSORIASIS, Disp: , Rfl:  .   naloxone (NARCAN) nasal spray 4 mg/0.1 mL, Use in case of opioid overdose (Patient not taking: Reported on 03/19/2019), Disp: 1 each, Rfl: 0 .  Nutritional Supplements (KETO PO), Take by mouth daily., Disp: , Rfl:  .  sertraline (ZOLOFT) 100 MG tablet, TAKE 1.5 TABLETS BY MOUTH DAILY (Patient not taking: Reported on 03/19/2019), Disp: 45 tablet, Rfl: 2  Review of Systems  Constitutional: Negative.   Respiratory: Negative.   Cardiovascular: Positive for chest pain. Negative for palpitations and leg swelling.    Social History   Tobacco Use  . Smoking status: Current Every Day Smoker    Packs/day: 1.50    Years: 51.00    Pack years: 76.50    Types: Cigarettes  . Smokeless tobacco: Never Used  . Tobacco comment: started smoking at age 29; has decreased cigarette use from 2 PPD to 0.5 PPD to 1 PPD  Substance Use Topics  . Alcohol use: Yes    Alcohol/week: 4.0 standard drinks    Types: 4 Cans of beer per week      Objective:   BP 131/83 (BP Location: Left Arm, Patient Position: Sitting, Cuff Size: Large)   Pulse 74   Temp (!) 97.1 F (36.2 C) (Temporal)   Resp 16   Ht 5\' 8"  (1.727 m)   Wt 205 lb (93 kg)   SpO2 96%   BMI 31.17 kg/m  Vitals:   03/19/19 0857  BP: 131/83  Pulse: 74  Resp: 16  Temp: (!) 97.1 F (36.2 C)  TempSrc: Temporal  SpO2: 96%  Weight: 205 lb (93 kg)  Height: 5\' 8"  (1.727 m)  Body mass index is 31.17 kg/m.   Physical Exam Vitals reviewed.  Constitutional:      General: He is not in acute distress.    Appearance: Normal appearance. He is not diaphoretic.  HENT:     Head: Normocephalic and atraumatic.  Eyes:     General: No scleral icterus.    Conjunctiva/sclera: Conjunctivae normal.  Cardiovascular:     Rate and Rhythm: Normal rate and regular rhythm.     Pulses: Normal pulses.     Heart sounds: Normal heart sounds. No murmur.  Pulmonary:     Effort: Pulmonary effort is normal. No respiratory distress.     Breath sounds: Normal breath  sounds. No wheezing or rhonchi.  Abdominal:     General: There is no distension.     Palpations: Abdomen is soft.     Tenderness: There is no abdominal tenderness.  Musculoskeletal:     Cervical back: Neck supple.     Right lower leg: No edema.     Left lower leg: No edema.  Lymphadenopathy:     Cervical: No cervical adenopathy.  Skin:    General: Skin is warm and dry.     Findings: No rash.  Neurological:     Mental Status: He is alert and oriented  to person, place, and time.     Cranial Nerves: No cranial nerve deficit.  Psychiatric:        Mood and Affect: Mood normal.        Behavior: Behavior normal.     EKG: NSR, incomplete RBBB with R-S-R' in V1. Biphasic P waves. Inferior Q waves. Unchanged from previous  No results found for any visits on 03/19/19.     Assessment & Plan    Problem List Items Addressed This Visit      Cardiovascular and Mediastinum   Hypertension    Well controlled Continue current medications Recheck metabolic panel F/u in 6 months       Relevant Orders   Basic Metabolic Panel (BMET)   CAD (coronary artery disease), native coronary artery    Chronic and previously without angina 2 episodes of chest pain in the last 2 weeks that are anginal from his description These were when he was without his Imdur, which she is now back on and no longer having chest pain EKG is unchanged from previous Continue Imdur Trial of nitroglycerin if he has recurrent chest pain Advised him to follow-up with his cardiologist if he has any more recurrent chest pain Discussed emergent precautions as well        Respiratory   OSA (obstructive sleep apnea)    Reports that he is unable to tolerate his CPAP machine and wakes up in the middle of the night and feels as though he cannot breathe out He is following with pulmonology and they are planning to see him back later this month He may need to consider BiPAP instead of CPAP or different mask He will call for  follow-up visit with pulmonology as this has not yet been scheduled        Other   Chest pain - Primary    As above, seems likely that this was related to not having his Imdur for angina Continue Imdur Nitro as needed Follow-up with cardiology if recurs Discussed strict emergent precautions      Relevant Orders   EKG 12-Lead (Completed)   Moderate episode of recurrent major depressive disorder ([place])    Chronic and well-controlled Has now tapered off of SSRI Continue to monitor       Other Visit Diagnoses    Thumb pain, left       Relevant Orders   Ambulatory referral to Hand Surgery   Toenail fungus       Relevant Orders   Ambulatory referral to Podiatry       Return in about 6 months (around 09/19/2019) for chronic disease f/u.   The entirety of the information documented in the History of Present Illness, Review of Systems and Physical Exam were personally obtained by me. Portions of this information were initially documented by Lynford Humphrey, CMA and reviewed by me for thoroughness and accuracy.    Imane Burrough, Dionne Bucy, MD MPH [facility]

## 2019-03-19 NOTE — Assessment & Plan Note (Signed)
Reports that he is unable to tolerate his CPAP machine and wakes up in the middle of the night and feels as though he cannot breathe out He is following with pulmonology and they are planning to see him back later this month He may need to consider BiPAP instead of CPAP or different mask He will call for follow-up visit with pulmonology as this has not yet been scheduled

## 2019-03-20 ENCOUNTER — Encounter: Payer: Self-pay | Admitting: Pain Medicine

## 2019-03-20 ENCOUNTER — Telehealth: Payer: Self-pay

## 2019-03-20 LAB — BASIC METABOLIC PANEL
BUN/Creatinine Ratio: 17 (ref 10–24)
BUN: 14 mg/dL (ref 8–27)
CO2: 26 mmol/L (ref 20–29)
Calcium: 9.4 mg/dL (ref 8.6–10.2)
Chloride: 102 mmol/L (ref 96–106)
Creatinine, Ser: 0.82 mg/dL (ref 0.76–1.27)
GFR calc Af Amer: 110 mL/min/{1.73_m2} (ref >59)
GFR calc non Af Amer: 95 mL/min/{1.73_m2} (ref >59)
Glucose: 88 mg/dL (ref 65–99)
Potassium: 4.5 mmol/L (ref 3.5–5.2)
Sodium: 142 mmol/L (ref 134–144)

## 2019-03-20 NOTE — Telephone Encounter (Signed)
-----   Message from Virginia Crews, MD sent at 03/20/2019  9:42 AM EDT ----- Normal labs

## 2019-03-20 NOTE — Progress Notes (Signed)
Cancelled.  

## 2019-03-20 NOTE — Progress Notes (Signed)
Pain relief after procedure (treated area only): (Questions asked to patient) 1. Starting about 15 minutes after the procedure, and "while the area was still numb" (from the local anesthetics), were you having any of your usual pain "in that area" (the treated area)?  (NOTE: NOT including the discomfort from the needle sticks.) First 1 hour: 100 % better. First 4-6 hours:100 % better.   3. How much better is your pain now, when compared to before the procedure? Current benefit: 50 % better. 4. Can you move better now? Improvement in ROM (Range of Motion): Yes. 5. Can you do more now? Improvement in function: Yes. 4. Did you have any problems with the procedure? Side-effects/Complications: No. 

## 2019-03-20 NOTE — Telephone Encounter (Signed)
Pt advised.   Thanks,   -Oliwia Berzins  

## 2019-03-20 NOTE — Progress Notes (Signed)
Patient: Caleb [name]  Service Category: E/M  Provider: Gaspar Cola, MD  DOB: Dec 06, 1955  DOS: 03/21/2019  Location: Office  MRN: [id number]  Setting: Ambulatory outpatient  Referring Provider: Virginia Crews, MD  Type: Established Patient  Specialty: Interventional Pain Management  PCP: Virginia Crews, MD  Location: Remote location  Delivery: TeleHealth     Virtual Encounter - Pain Management PROVIDER NOTE: Information contained herein reflects review and annotations entered in association with encounter. Interpretation of such information and data should be left to medically-trained personnel. Information provided to patient can be located elsewhere in the medical record under "Patient Instructions". Document created using STT-dictation technology, any transcriptional errors that may result from process are unintentional.    Contact & Pharmacy Preferred: [phone] Home: [phone] (home) Mobile: [phone] (mobile) E-mail: [email or web address]  [id number]Phone: [phone]: [phone]  Pre-screening  Mr. HWhangoffered "in-person" vs "virtual" encounter. He indicated preferring virtual for this encounter.   Reason COVID-19*  Social distancing based on CDC and AMA recommendations.   I contacted RMerian Capronon 03/21/2019 via telephone.      I clearly identified myself as FGaspar Cola MD. I verified that I was speaking with the correct person using two identifiers (Name: RJENS SIEMS and date of birth: 1Aug 24, 1957.  Consent I sought verbal advanced consent from [name] virtual visit interactions. I informed Mr. HKaminskyof possible security and privacy concerns, risks, and limitations associated with providing "not-in-person" medical evaluation and management services. I also informed Mr. HDiodatoof the availability of "in-person"  appointments. Finally, I informed him that there would be a charge for the virtual visit and that he could be  personally, fully or partially, financially responsible for it. Mr. HPlottsexpressed understanding and agreed to proceed.   Historic Elements   Mr. RALIE HARDGROVEis a 64y.o. year old, male patient evaluated today after his last contact with our practice on 03/09/2019. Caleb [name] has a past medical history of Chronic foot pain, right (2015), COPD (chronic obstructive pulmonary disease) ([place], Coronary artery disease, Emphysema lung ([place], Hyperlipidemia, Hypertension, Leucocytosis, Myocardial infarction ([place] (2015), and OSA on CPAP. He also  has a past surgical history that includes Cardiac catheterization (2015); Lumbar laminectomy (1989, 1999); Cervical fusion (1988, 1998); Liver surgery (2015); Partial colectomy (1990); Inguinal hernia repair (Bilateral, 1975); Lithotripsy; and Colonoscopy with propofol (N/A, 02/24/2017). Mr. HOdonohuehas a current medication list which includes the following prescription(s): anoro ellipta, aspirin ec, cetirizine, fluticasone, hydrochlorothiazide, hydrocodone-acetaminophen, [START ON 04/18/2019] hydrocodone-acetaminophen, [START ON 05/18/2019] hydrocodone-acetaminophen, isosorbide mononitrate, losartan, methocarbamol, mometasone, naloxone, neomycin-polymyxin-hydrocortisone, nitroglycerin, nutritional supplements, pregabalin, rosuvastatin, secukinumab, and tadalafil. He  reports that he has been smoking cigarettes. He has a 76.50 pack-year smoking history. He has never used smokeless tobacco. He reports current alcohol use of about 4.0 standard drinks of alcohol per week. He reports that he does not use drugs. Mr. HBlitchis allergic to lisinopril and oxycodone.   HPI  Today, he is being contacted for a post-procedure assessment.  The patient indicates that his right foot has been doing a lot better.  With the exception of the increase in the pain that he gets when  there is a storm coming, he refers that he has improved at least 50%, on the average.  He refers that this is a significant improvement for him and he is now able to go  back to doing normal things again.  Post-Procedure Evaluation  Procedure (03/08/2019): Palliative/diagnostic right-sidedSuperficial Peroneal NB#1 under fluoroscopic guidance, and IV sedation Pre-procedure pain level:  5/10 Post-procedure: 0/10 (100% relief)  Sedation: Sedation provided.  Dewayne Shorter, RN  03/20/2019  2:09 PM  Signed Pain relief after procedure (treated area only): (Questions asked to patient) 1. Starting about 15 minutes after the procedure, and "while the area was still numb" (from the local anesthetics), were you having any of your usual pain "in that area" (the treated area)?  (NOTE: NOT including the discomfort from the needle sticks.) First 1 hour: 100 % better. First 4-6 hours: 100 % better. .  3. How much better is your pain now, when compared to before the procedure? Current benefit: 50 % better. 4. Can you move better now? Improvement in ROM (Range of Motion): Yes. 5. Can you do more now? Improvement in function: Yes. 4. Did you have any problems with the procedure? Side-effects/Complications: No.  Current benefits: Defined as benefit that persist at this time.   Analgesia:  50% improved Function: Caleb [name] reports improvement in function ROM: Caleb [name] reports improvement in ROM  Pharmacotherapy Assessment  Analgesic: Hydrocodone/APAP 5/325 1 tablet every 6 hours (20 mg/day of hydrocodone).  (Unable to tolerate an oxycodone IR trial due to stomachaches, nausea, headaches, and excessive somnolence.) MME/day: 20 mg/day.   Monitoring: [place] PMP: PDMP reviewed during this encounter.       Pharmacotherapy: No side-effects or adverse reactions reported. Compliance: No problems identified. Effectiveness: Clinically acceptable. Plan: Refer to "POC".  UDS:  Summary  Date Value Ref Range  Status  12/21/2017 FINAL  Final    Comment:    ==================================================================== TOXASSURE SELECT 13 (MW) ==================================================================== Test                             Result       Flag       Units Drug Present and Declared for Prescription Verification   Oxycodone                      276          EXPECTED   ng/mg creat   Oxymorphone                    1086         EXPECTED   ng/mg creat   Noroxycodone                   1214         EXPECTED   ng/mg creat   Noroxymorphone                 397          EXPECTED   ng/mg creat    Sources of oxycodone are scheduled prescription medications.    Oxymorphone, noroxycodone, and noroxymorphone are expected    metabolites of oxycodone. Oxymorphone is also available as a    scheduled prescription medication. Drug Absent but Declared for Prescription Verification   Hydrocodone                    Not Detected UNEXPECTED ng/mg creat ==================================================================== Test                      Result    Flag   Units      Ref  Range   Creatinine              96               mg/dL      >=20 ==================================================================== Declared Medications:  The flagging and interpretation on this report are based on the  following declared medications.  Unexpected results may arise from  inaccuracies in the declared medications.  **Note: The testing scope of this panel includes these medications:  Hydrocodone (Norco)  Oxycodone  **Note: The testing scope of this panel does not include following  reported medications:  Acetaminophen (Norco)  Albuterol  Aspirin (Aspirin 81)  Fluticasone (Flovent)  Hydrochlorothiazide (Hydrodiuril)  Isosorbide (Imdur)  Losartan (Cozaar)  Methocarbamol (Robaxin)  Metoprolol (Lopressor)  Nitroglycerin (Nitrostat)  Pregabalin (Lyrica)  Rosuvastatin (Crestor)  Secukinumab   Tiotropium (Spiriva)  Triamcinolone (Kenalog) ==================================================================== For clinical consultation, please call [phone]. ====================================================================    Laboratory Chemistry Profile   Renal Lab Results  Component Value Date   BUN 14 03/19/2019   CREATININE 0.82 03/19/2019   BCR 17 03/19/2019   GFRAA 110 03/19/2019   GFRNONAA 95 03/19/2019    Hepatic Lab Results  Component Value Date   AST 22 08/30/2018   ALT 23 08/30/2018   ALBUMIN 3.8 08/30/2018   ALKPHOS 71 08/30/2018    Electrolytes Lab Results  Component Value Date   NA 142 03/19/2019   K 4.5 03/19/2019   CL 102 03/19/2019   CALCIUM 9.4 03/19/2019   MG 2.0 05/30/2018    Bone Lab Results  Component Value Date   25OHVITD1 32 02/17/2017   25OHVITD2 <1.0 02/17/2017   25OHVITD3 32 02/17/2017   TESTOFREE 5.2 (L) 02/28/2017   TESTOSTERONE 279 02/28/2017    Inflammation (CRP: Acute Phase) (ESR: Chronic Phase) Lab Results  Component Value Date   CRP 8 11/07/2017   ESRSEDRATE 26 11/07/2017      Note: Above Lab results reviewed.  Imaging  DG PAIN CLINIC C-ARM 1-60 MIN NO REPORT Fluoro was used, but no Radiologist interpretation will be provided.  Please refer to "NOTES" tab for provider progress note.  Assessment  The primary encounter diagnosis was Chronic pain syndrome. Diagnoses of Chronic foot pain (Primary Area of Pain) (Right), Chronic ankle pain (Secondary Area of Pain) (Right), and Chronic thoracic back pain (Tertiary Area of Pain) (Midline) were also pertinent to this visit.  Plan of Care  Problem-specific:  No problem-specific Assessment & Plan notes found for this encounter.  Caleb [name] has a current medication list which includes the following long-term medication(s): cetirizine, hydrocodone-acetaminophen, [START ON 04/18/2019] hydrocodone-acetaminophen, [START ON 05/18/2019] hydrocodone-acetaminophen,  isosorbide mononitrate, losartan, methocarbamol, nitroglycerin, pregabalin, rosuvastatin, and tadalafil.  Pharmacotherapy (Medications Ordered): No orders of the defined types were placed in this encounter.  Orders:  No orders of the defined types were placed in this encounter.  Follow-up plan:   Return in about 11 weeks (around 06/06/2019) for (VV), (MM).      Interventional management options: Considering:   Possible bilateral cervical facet RFA Diagnostic right ankle block Diagnosticright lumbar sympathetic block Diagnostic midlineTESI Diagnostic bilateral thoracic facet block Possible bilateral thoracic facet RFA   Palliative PRN treatment(s):   Palliative left CESI #4 Diagnostic bilateral cervical facet block#3 Palliative/diagnostic right-sidedCommon Peroneal NB#3 Palliative right-sided common peroneal nerve RFA #2(last done on 06/21/2017)     Recent Visits Date Type Provider Dept  03/08/19 Procedure visit Milinda Pointer, MD [facility]  03/07/19 Telemedicine Milinda Pointer, [facility]  01/25/19 Telemedicine Milinda Pointer, MD [facility]  01/11/19 Procedure visit Milinda Pointer, MD [facility]  01/01/19 Telemedicine Milinda Pointer, MD [facility]  Showing recent visits within past 90 days and meeting all other requirements   Today's Visits Date Type Provider Dept  03/21/19 Telemedicine Milinda Pointer, MD [facility]  Showing today's visits and meeting all other requirements   Future Appointments Date Type Provider Dept  06/13/19 Appointment Milinda Pointer, MD [facility]  Showing future appointments within next 90 days and meeting all other requirements   I discussed the assessment and treatment plan with the patient. The patient was provided an opportunity to ask questions and all were answered. The patient agreed with the plan and demonstrated an  understanding of the instructions.  Patient advised to call back or seek an in-person evaluation if the symptoms or condition worsens.  Duration of encounter: 5 minutes.  Note by: Gaspar Cola, MD Date: 03/21/2019; Time: 7:25 AM

## 2019-03-21 ENCOUNTER — Ambulatory Visit: Payer: Medicaid Other | Attending: Pain Medicine | Admitting: Pain Medicine

## 2019-03-21 ENCOUNTER — Ambulatory Visit (INDEPENDENT_AMBULATORY_CARE_PROVIDER_SITE_OTHER): Payer: Medicaid Other | Admitting: Acute Care

## 2019-03-21 ENCOUNTER — Other Ambulatory Visit: Payer: Self-pay

## 2019-03-21 ENCOUNTER — Encounter: Payer: Self-pay | Admitting: Acute Care

## 2019-03-21 VITALS — BP 136/90 | HR 92 | Temp 98.3°F | Ht 68.0 in | Wt 205.0 lb

## 2019-03-21 DIAGNOSIS — M79671 Pain in right foot: Secondary | ICD-10-CM | POA: Diagnosis not present

## 2019-03-21 DIAGNOSIS — G4733 Obstructive sleep apnea (adult) (pediatric): Secondary | ICD-10-CM

## 2019-03-21 DIAGNOSIS — M546 Pain in thoracic spine: Secondary | ICD-10-CM | POA: Diagnosis not present

## 2019-03-21 DIAGNOSIS — G894 Chronic pain syndrome: Secondary | ICD-10-CM

## 2019-03-21 DIAGNOSIS — F17210 Nicotine dependence, cigarettes, uncomplicated: Secondary | ICD-10-CM

## 2019-03-21 DIAGNOSIS — Z720 Tobacco use: Secondary | ICD-10-CM

## 2019-03-21 DIAGNOSIS — G8929 Other chronic pain: Secondary | ICD-10-CM

## 2019-03-21 DIAGNOSIS — M25571 Pain in right ankle and joints of right foot: Secondary | ICD-10-CM | POA: Diagnosis not present

## 2019-03-21 NOTE — Patient Instructions (Addendum)
It is good to see you today. We will order a Split night BiPAP  titration study. You will get a call to set up the study. We will have a follow up appointment once the study is read to see if we need to make any adjustments to your therapy..  Please continue to work on quitting smoking. Set a quit date. Remember you can call 1-800-Quit Now for free nicotine gum.  Ambulatory referral for Lung Cancer screening >> Caleb [name]) Continue on BiPAP at bedtime. You appear to be benefiting from the treatment  You need to wear this therapy every night.  Goal is to wear for at least 6 hours each night for maximal clinical benefit. Continue to work on weight loss, as the link between excess weight  and sleep apnea is well established.   Remember to establish a good bedtime routine, and work on sleep hygiene.  Limit daytime naps , avoid stimulants such as caffeine and nicotine close to bedtime, exercise daily to promote sleep quality, avoid heavy , spicy, fried , or rich foods before bed. Ensure adequate exposure to natural light during the day,establish a relaxing bedtime routine with a pleasant sleep environment ( Bedroom between 60 and 67 degrees, turn off bright lights , TV or device screens screens , consider black out curtains or white noise machines) Do not drive if sleepy. Remember to clean mask, tubing, filter, and reservoir once weekly with soapy water.  Follow up with Dr. Mortimer Fries  after BiPap Titration Study Once you know your scheduled study date, call the office here in [place] to let them know so they can schedule follow up with Dr. Mortimer Fries. Please contact office for sooner follow up if symptoms do not improve or worsen or seek emergency care .
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
## 2019-03-22 DIAGNOSIS — M189 Osteoarthritis of first carpometacarpal joint, unspecified: Secondary | ICD-10-CM | POA: Diagnosis not present

## 2019-03-27 ENCOUNTER — Other Ambulatory Visit: Payer: Self-pay

## 2019-03-27 ENCOUNTER — Encounter: Payer: Self-pay | Admitting: Podiatry

## 2019-03-27 ENCOUNTER — Encounter: Payer: Medicaid Other | Admitting: Podiatry

## 2019-03-27 ENCOUNTER — Ambulatory Visit: Payer: Medicaid Other

## 2019-03-27 DIAGNOSIS — G5790 Unspecified mononeuropathy of unspecified lower limb: Secondary | ICD-10-CM

## 2019-03-27 NOTE — Progress Notes (Signed)
Patient left the office without being seen today.  "I have been waiting for an hour and Im not waiting any more"  He left before I could tell him he was the next patient to be seen.  Xrays were taken today due to patient's concerns

## 2019-03-28 ENCOUNTER — Ambulatory Visit: Payer: Medicaid Other | Admitting: Urology

## 2019-04-01 ENCOUNTER — Encounter (HOSPITAL_COMMUNITY): Payer: Self-pay | Admitting: Emergency Medicine

## 2019-04-01 ENCOUNTER — Emergency Department (HOSPITAL_COMMUNITY): Payer: Medicaid Other

## 2019-04-01 ENCOUNTER — Emergency Department (HOSPITAL_COMMUNITY)
Admission: EM | Admit: 2019-04-01 | Discharge: 2019-04-01 | Disposition: A | Discharge status: Home / Self Care | Payer: Medicaid Other | Attending: Emergency Medicine | Admitting: Emergency Medicine

## 2019-04-01 ENCOUNTER — Other Ambulatory Visit: Payer: Self-pay

## 2019-04-01 DIAGNOSIS — Z5321 Procedure and treatment not carried out due to patient leaving prior to being seen by health care provider: Secondary | ICD-10-CM | POA: Diagnosis not present

## 2019-04-01 DIAGNOSIS — Z5320 Procedure and treatment not carried out because of patient's decision for unspecified reasons: Secondary | ICD-10-CM | POA: Diagnosis not present

## 2019-04-01 DIAGNOSIS — R0902 Hypoxemia: Secondary | ICD-10-CM | POA: Diagnosis not present

## 2019-04-01 DIAGNOSIS — R0602 Shortness of breath: Secondary | ICD-10-CM | POA: Diagnosis not present

## 2019-04-01 DIAGNOSIS — R079 Chest pain, unspecified: Secondary | ICD-10-CM | POA: Diagnosis not present

## 2019-04-01 DIAGNOSIS — R0789 Other chest pain: Secondary | ICD-10-CM | POA: Diagnosis not present

## 2019-04-01 DIAGNOSIS — R000 Tachycardia, unspecified: Secondary | ICD-10-CM | POA: Diagnosis not present

## 2019-04-01 LAB — CBC
HCT: 47.9 % (ref 39.0–52.0)
Hemoglobin: 15.8 g/dL (ref 13.0–17.0)
MCH: 31.4 pg (ref 26.0–34.0)
MCHC: 33 g/dL (ref 30.0–36.0)
MCV: 95.2 fL (ref 80.0–100.0)
Platelets: 234 10*3/uL (ref 150–400)
RBC: 5.03 MIL/uL (ref 4.22–5.81)
RDW: 12.6 % (ref 11.5–15.5)
WBC: 14.1 10*3/uL — ABNORMAL HIGH (ref 4.0–10.5)
nRBC: 0 % (ref 0.0–0.2)

## 2019-04-01 LAB — BASIC METABOLIC PANEL
Anion gap: 13 (ref 5–15)
BUN: 16 mg/dL (ref 8–23)
CO2: 27 mmol/L (ref 22–32)
Calcium: 9.4 mg/dL (ref 8.9–10.3)
Chloride: 98 mmol/L (ref 98–111)
Creatinine, Ser: 0.75 mg/dL (ref 0.61–1.24)
GFR calc Af Amer: >60 mL/min (ref >60)
GFR calc non Af Amer: >60 mL/min (ref >60)
Glucose, Bld: 92 mg/dL (ref 70–99)
Potassium: 3.9 mmol/L (ref 3.5–5.1)
Sodium: 138 mmol/L (ref 135–145)

## 2019-04-01 LAB — TROPONIN I (HIGH SENSITIVITY): Troponin I (High Sensitivity): 12 ng/L (ref <18)

## 2019-04-01 MED ORDER — SODIUM CHLORIDE 0.9% FLUSH: 3.0000 mL | Freq: Once | INTRAVENOUS | Status: DC

## 2019-04-01 NOTE — ED Notes (Addendum)
Pt states he is ready to leave, does not want to wait any longer; Dr. Wilson Singer notified

## 2019-04-01 NOTE — ED Triage Notes (Signed)
Pt to triage via [facility] from home.  C/o sudden onset sharp, non-radiating L sided chest pain since 3pm.  Watching TV when pain started.  Tried muscle relaxer, Flonase, and 2 NTG without relief.  EMS administered 2 NTG and 4 baby ASA.  Pain decreased from 10-8.  Reports mild SOB.  Denies nausea and vomiting.

## 2019-04-02 ENCOUNTER — Telehealth: Payer: Self-pay | Admitting: Cardiovascular Disease

## 2019-04-02 NOTE — Telephone Encounter (Signed)
Pt c/o of Chest Pain: STAT if CP now or developed within 24 hours  1. Are you having CP right now? Not as bad as yesterday, he was seen in [facility] ED, but left because "I got tired of waiting, after 2 hours". Was taken via EMS  2. Are you experiencing any other symptoms (ex. SOB, nausea, vomiting, sweating)? No other symptoms   3. How long have you been experiencing CP? Just started yesterday around 2 pm  4. Is your CP continuous or coming and going? Continual   5. Have you taken Nitroglycerin? Yes ?
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
## 2019-04-02 NOTE — Telephone Encounter (Signed)
Incoming triage call transferred. Spoke with the patient. Patient sts that he had an episode of chest pain yesterday while sitting in the recliner. Patient denies associating symptoms. No sob, n/v, diaphoresis. The chest pain was left sided and was a 8-10, with no radiation. Patient took [employer] and asa 81 mg with no relief. He then called 911. Patient was transported by EMS to [facility] ED at his request. Troponin (high sensitivity) x1  Resulted12. Patient left the ED AMA complaining that the wait time was to long.  The patient reports that he is still having intermittent chest pain today with no associating symptoms. He does also complain of soreness in his chest denies any possible injury. Nothing makes it better or worse. He sts that he received some bad new yesterday and feels that his CP could have been releated to anxiety.  Patient request to be scheduled for an appointment. Advised the patient that my first recommendation based on his cardiac history is for him to return to the ED asap for evaluation. Patient declined.  Offered the patient an appt with Dr. Rockey Situ today at 3:20pm. Patient sts that he does not have a ride and that tomorrow would be better. Appt scheduled for 04/03/19 @ 3:20pm with Dr. Rockey Situ. Advised the patient that he is to call 911 for worsening or any new symptoms. Patient agrees and voiced appreciation for the assistance.

## 2019-04-03 ENCOUNTER — Other Ambulatory Visit: Payer: Self-pay

## 2019-04-03 ENCOUNTER — Encounter: Payer: Self-pay | Admitting: Cardiovascular Disease

## 2019-04-03 ENCOUNTER — Ambulatory Visit (INDEPENDENT_AMBULATORY_CARE_PROVIDER_SITE_OTHER): Payer: Medicaid Other | Admitting: Cardiovascular Disease

## 2019-04-03 VITALS — BP 130/90 | HR 89 | Ht 68.0 in | Wt 203.2 lb

## 2019-04-03 DIAGNOSIS — I472 Ventricular tachycardia: Secondary | ICD-10-CM

## 2019-04-03 DIAGNOSIS — R55 Syncope and collapse: Secondary | ICD-10-CM | POA: Diagnosis not present

## 2019-04-03 DIAGNOSIS — J449 Chronic obstructive pulmonary disease, unspecified: Secondary | ICD-10-CM

## 2019-04-03 DIAGNOSIS — I25118 Atherosclerotic heart disease of native coronary artery with other forms of angina pectoris: Secondary | ICD-10-CM | POA: Diagnosis not present

## 2019-04-03 DIAGNOSIS — E785 Hyperlipidemia, unspecified: Secondary | ICD-10-CM

## 2019-04-03 DIAGNOSIS — Z720 Tobacco use: Secondary | ICD-10-CM

## 2019-04-03 DIAGNOSIS — I4729 Other ventricular tachycardia: Secondary | ICD-10-CM

## 2019-04-03 DIAGNOSIS — I10 Essential (primary) hypertension: Secondary | ICD-10-CM

## 2019-04-03 NOTE — Patient Instructions (Signed)

## 2019-04-09 ENCOUNTER — Other Ambulatory Visit: Payer: Self-pay | Admitting: Family Medicine

## 2019-04-10 ENCOUNTER — Other Ambulatory Visit
Admission: RE | Admit: 2019-04-10 | Discharge: 2019-04-10 | Disposition: A | Discharge status: Home / Self Care | Payer: Medicaid Other | Source: Ambulatory Visit | Attending: Acute Care | Admitting: Acute Care

## 2019-04-10 ENCOUNTER — Other Ambulatory Visit: Payer: Self-pay

## 2019-04-10 DIAGNOSIS — Z01812 Encounter for preprocedural laboratory examination: Secondary | ICD-10-CM | POA: Insufficient documentation

## 2019-04-10 DIAGNOSIS — Z20822 Contact with and (suspected) exposure to covid-19: Secondary | ICD-10-CM | POA: Insufficient documentation

## 2019-04-10 LAB — SARS CORONAVIRUS 2 (TAT 6-24 HRS): SARS Coronavirus 2: NEGATIVE

## 2019-04-11 ENCOUNTER — Telehealth: Payer: Self-pay | Admitting: Cardiovascular Disease

## 2019-04-11 NOTE — Telephone Encounter (Signed)
[facility] calling to [facility] is[facility]ide. Advised to first call the pharmacy and see if they can replace as there are plenty of refills on file.    Patient will call back if needed .

## 2019-04-12 ENCOUNTER — Ambulatory Visit: Payer: Medicaid Other | Attending: Pain Medicine | Admitting: Pain Medicine

## 2019-04-12 ENCOUNTER — Other Ambulatory Visit: Payer: Self-pay

## 2019-04-12 ENCOUNTER — Ambulatory Visit: Payer: Medicaid Other | Attending: Pulmonary Disease

## 2019-04-12 DIAGNOSIS — M79671 Pain in right foot: Secondary | ICD-10-CM

## 2019-04-12 DIAGNOSIS — M792 Neuralgia and neuritis, unspecified: Secondary | ICD-10-CM | POA: Diagnosis not present

## 2019-04-12 DIAGNOSIS — G8929 Other chronic pain: Secondary | ICD-10-CM | POA: Diagnosis not present

## 2019-04-12 DIAGNOSIS — S8411XS Injury of peroneal nerve at lower leg level, right leg, sequela: Secondary | ICD-10-CM

## 2019-04-12 DIAGNOSIS — G4733 Obstructive sleep apnea (adult) (pediatric): Secondary | ICD-10-CM | POA: Insufficient documentation

## 2019-04-12 DIAGNOSIS — M25571 Pain in right ankle and joints of right foot: Secondary | ICD-10-CM | POA: Diagnosis not present

## 2019-04-12 DIAGNOSIS — M79604 Pain in right leg: Secondary | ICD-10-CM | POA: Diagnosis not present

## 2019-04-12 MED ORDER — PREGABALIN 150 MG PO CAPS: 150.0000 mg | ORAL_CAPSULE | Freq: Three times a day (TID) | ORAL | 5 refills | Status: DC

## 2019-04-12 NOTE — Progress Notes (Deleted)
Scheduling mistake 

## 2019-04-12 NOTE — Patient Instructions (Signed)
____________________________________________________________________________________________  Preparing for Procedure with Sedation  Procedure appointments are limited to planned procedures: . No Prescription Refills. . No disability issues will be discussed. . No medication changes will be discussed.  Instructions: . Oral Intake: Do not eat or drink anything for at least 3 hours prior to your procedure. (Exception: Blood Pressure Medication. See below.) . Transportation: Unless otherwise stated by your physician, you may drive yourself after the procedure. . Blood Pressure Medicine: Do not forget to take your blood pressure medicine with a sip of water the morning of the procedure. If your Diastolic (lower reading)is above 100 mmHg, elective cases will be cancelled/rescheduled. . Blood thinners: These will need to be stopped for procedures. Notify our staff if you are taking any blood thinners. Depending on which one you take, there will be specific instructions on how and when to stop it. . Diabetics on insulin: Notify the staff so that you can be scheduled 1st case in the morning. If your diabetes requires high dose insulin, take only  of your normal insulin dose the morning of the procedure and notify the staff that you have done so. . Preventing infections: Shower with an antibacterial soap the morning of your procedure. . Build-up your immune system: Take 1000 mg of Vitamin C with every meal (3 times a day) the day prior to your procedure. . Antibiotics: Inform the staff if you have a condition or reason that requires you to take antibiotics before dental procedures. . Pregnancy: If you are pregnant, call and cancel the procedure. . Sickness: If you have a cold, fever, or any active infections, call and cancel the procedure. . Arrival: You must be in the facility at least 30 minutes prior to your scheduled procedure. . Children: Do not bring children with you. . Dress appropriately:  Bring dark clothing that you would not mind if they get stained. . Valuables: Do not bring any jewelry or valuables.  Reasons to call and reschedule or cancel your procedure: (Following these recommendations will minimize the risk of a serious complication.) . Surgeries: Avoid having procedures within 2 weeks of any surgery. (Avoid for 2 weeks before or after any surgery). . Flu Shots: Avoid having procedures within 2 weeks of a flu shots or . (Avoid for 2 weeks before or after immunizations). . Barium: Avoid having a procedure within 7-10 days after having had a radiological study involving the use of radiological contrast. (Myelograms, Barium swallow or enema study). . Heart attacks: Avoid any elective procedures or surgeries for the initial 6 months after a "Myocardial Infarction" (Heart Attack). . Blood thinners: It is imperative that you stop these medications before procedures. Let us know if you if you take any blood thinner.  . Infection: Avoid procedures during or within two weeks of an infection (including chest colds or gastrointestinal problems). Symptoms associated with infections include: Localized redness, fever, chills, night sweats or profuse sweating, burning sensation when voiding, cough, congestion, stuffiness, runny nose, sore throat, diarrhea, nausea, vomiting, cold or Flu symptoms, recent or current infections. It is specially important if the infection is over the area that we intend to treat. . Heart and lung problems: Symptoms that may suggest an active cardiopulmonary problem include: cough, chest pain, breathing difficulties or shortness of breath, dizziness, ankle swelling, uncontrolled high or unusually low blood pressure, and/or palpitations. If you are experiencing any of these symptoms, cancel your procedure and contact your primary care physician for an evaluation.  Remember:  Regular Business hours are:    Monday to Thursday 8:00 AM to 4:00 PM  Provider's  Schedule: Isaia Hassell, MD:  Procedure days: Tuesday and Thursday 7:30 AM to 4:00 PM  Bilal Lateef, MD:  Procedure days: Monday and Wednesday 7:30 AM to 4:00 PM ____________________________________________________________________________________________    

## 2019-04-12 NOTE — Progress Notes (Signed)
Patient: Caleb [name]  Service Category: E/M  Provider: Gaspar Cola, MD  DOB: [birth year]  DOS: 04/12/2019  Location: Office  MRN: [id number]  Setting: Ambulatory outpatient  Referring Provider: Virginia Crews, MD  Type: Established Patient  Specialty: Interventional Pain Management  PCP: Virginia Crews, MD  Location: Remote location  Delivery: TeleHealth     Virtual Encounter - Pain Management PROVIDER NOTE: Information contained herein reflects review and annotations entered in association with encounter. Interpretation of such information and data should be left to medically-trained personnel. Information provided to patient can be located elsewhere in the medical record under "Patient Instructions". Document created using STT-dictation technology, any transcriptional errors that may result from process are unintentional.    Contact & Pharmacy Preferred: [phone] Home: [phone] (home) Mobile: [phone] (mobile) E-mail: [email or web address]  [id number] [place] [id number]: [phone]: [phone]  Pre-screening  Mr. HEbleoffered "in-person" vs "virtual" encounter. He indicated preferring virtual for this encounter.   Reason COVID-19*  Social distancing based on CDC and AMA recommendations.   I contacted RMerian Capronon 04/12/2019 via telephone.      I clearly identified myself as FGaspar Cola MD. I verified that I was speaking with the correct person using two identifiers (Name: RMORAD TAL and date of birth: [birth year].  Consent I sought verbal advanced consent from [name] virtual visit interactions. I informed Mr. HHockeyof possible security and privacy concerns, risks, and limitations associated with providing "not-in-person" medical evaluation and management services. I also informed Mr. HAlcocerof the availability of "in-person" appointments.  Finally, I informed him that there would be a charge for the virtual visit and that he could be  personally, fully or partially, financially responsible for it. Mr. HRieraexpressed understanding and agreed to proceed.   Historic Elements   Mr. RDIRCK BUTCHis a 64y.o. year old, male patient evaluated today after his last contact with our practice on 03/09/2019. Mr. HBrickey has a past medical history of Chronic foot pain, right (2015), COPD (chronic obstructive pulmonary disease) ([place], Coronary artery disease, Emphysema lung (HJewett, Hyperlipidemia, Hypertension, Leucocytosis, Myocardial infarction (HOphir (2015), and OSA on CPAP. He also  has a past surgical history that includes Cardiac catheterization (2015); Lumbar laminectomy (1989, 1999); Cervical fusion (1988, 1998); Liver surgery (2015); Partial colectomy (1990); Inguinal hernia repair (Bilateral, 1975); Lithotripsy; and Colonoscopy with propofol (N/A, 02/24/2017). Mr. HShillerhas a current medication list which includes the following prescription(s): anoro ellipta, aspirin ec, cetirizine, fluticasone, hydrochlorothiazide, hydrocodone-acetaminophen, [START ON 04/18/2019] hydrocodone-acetaminophen, [START ON 05/18/2019] hydrocodone-acetaminophen, isosorbide mononitrate, losartan, methocarbamol, mometasone, naloxone, neomycin-polymyxin-hydrocortisone, nitroglycerin, nutritional supplements, pregabalin, rosuvastatin, secukinumab, and tadalafil. He  reports that he has been smoking cigarettes. He has a 38.25 pack-year smoking history. He has never used smokeless tobacco. He reports current alcohol use of about 4.0 standard drinks of alcohol per week. He reports that he does not use drugs. Mr. HBelinskyis allergic to lisinopril and oxycodone.   HPI  Today, he is being contacted for worsening of previously known (established) problem.  Is currently having a flareup of his foot pain and he wants to increase his pain medication and the Lyrica.  The answer to that was  no.  I explained to the patient about "Drug Holiday" and the fact that he will need to eventually do that with the hydrocodone that he takes.  By his own account, he  has been taking this medication for the past 2 years and he feels that he has developed tolerance to it.  I am sure that this is the case and I completely agree that at this point is probably not can be working as well as it used to.  However, the solution to dealing with tolerance is not to increase the dose since we do not want to get into that roller coaster.  I have explained this in detail to the patient and what we will eventually do is to go through a drug holiday for the hydrocodone.  Meanwhile, since he is having a flareup of his pain, what we will do is increase his Lyrica from 150 mg p.o. twice daily to 3 times daily.  This would be very appropriate in his case.  However, the next step after that will be to do a Palliative/diagnostic right-sidedCommon Peroneal NB#3in order to get this pain under control.  Once we do that, then he should have a decrease in the pain and we should be able to complete that "Drug Holiday"  Pharmacotherapy Assessment  Analgesic: Hydrocodone/APAP 5/325 1 tablet every 6 hours (20 mg/day of hydrocodone).  (Unable to tolerate an oxycodone IR trial due to stomachaches, nausea, headaches, and excessive somnolence.) MME/day: 20 mg/day.   Monitoring:  PMP: PDMP reviewed during this encounter.       Pharmacotherapy: No side-effects or adverse reactions reported. Compliance: No problems identified. Effectiveness: Clinically acceptable. Plan: Refer to "POC".  UDS:  Summary  Date Value Ref Range Status  12/21/2017 FINAL  Final    Comment:    ==================================================================== TOXASSURE SELECT 13 (MW) ==================================================================== Test                             Result       Flag       Units Drug Present and Declared for  Prescription Verification   Oxycodone                      276          EXPECTED   ng/mg creat   Oxymorphone                    1086         EXPECTED   ng/mg creat   Noroxycodone                   1214         EXPECTED   ng/mg creat   Noroxymorphone                 397          EXPECTED   ng/mg creat    Sources of oxycodone are scheduled prescription medications.    Oxymorphone, noroxycodone, and noroxymorphone are expected    metabolites of oxycodone. Oxymorphone is also available as a    scheduled prescription medication. Drug Absent but Declared for Prescription Verification   Hydrocodone                    Not Detected UNEXPECTED ng/mg creat ==================================================================== Test                      Result    Flag   Units      Ref Range   Creatinine  96               mg/dL      >=20 ==================================================================== Declared Medications:  The flagging and interpretation on this report are based on the  following declared medications.  Unexpected results may arise from  inaccuracies in the declared medications.  **Note: The testing scope of this panel includes these medications:  Hydrocodone (Norco)  Oxycodone  **Note: The testing scope of this panel does not include following  reported medications:  Acetaminophen (Norco)  Albuterol  Aspirin (Aspirin 81)  Fluticasone (Flovent)  Hydrochlorothiazide (Hydrodiuril)  Isosorbide (Imdur)  Losartan (Cozaar)  Methocarbamol (Robaxin)  Metoprolol (Lopressor)  Nitroglycerin (Nitrostat)  Pregabalin (Lyrica)  Rosuvastatin (Crestor)  Secukinumab  Tiotropium (Spiriva)  Triamcinolone (Kenalog) ==================================================================== For clinical consultation, please call [phone]. ====================================================================    Laboratory Chemistry Profile   Renal Lab Results  Component Value  Date   BUN 16 04/01/2019   CREATININE 0.75 04/01/2019   BCR 17 03/19/2019   GFRAA >60 04/01/2019   GFRNONAA >60 04/01/2019     Hepatic Lab Results  Component Value Date   AST 22 08/30/2018   ALT 23 08/30/2018   ALBUMIN 3.8 08/30/2018   ALKPHOS 71 08/30/2018     Electrolytes Lab Results  Component Value Date   NA 138 04/01/2019   K 3.9 04/01/2019   CL 98 04/01/2019   CALCIUM 9.4 04/01/2019   MG 2.0 05/30/2018     Bone Lab Results  Component Value Date   25OHVITD1 32 02/17/2017   25OHVITD2 <1.0 02/17/2017   25OHVITD3 32 02/17/2017   TESTOFREE 5.2 (L) 02/28/2017   TESTOSTERONE 279 02/28/2017     Inflammation (CRP: Acute Phase) (ESR: Chronic Phase) Lab Results  Component Value Date   CRP 8 11/07/2017   ESRSEDRATE 26 11/07/2017       Note: Above Lab results reviewed.  Imaging  DG Chest 2 View CLINICAL DATA:  Chest pain. Shortness of breath. History of emphysema.  EXAM: CHEST - 2 VIEW  COMPARISON:  Chest x-rays dated 02/09/2018 and 05/17/2017  FINDINGS: The heart size and mediastinal contours are within normal limits. Both lungs are clear. The visualized skeletal structures are unremarkable.  IMPRESSION: No active cardiopulmonary disease.  Electronically Signed   By: Lorriane Shire M.D.   On: 04/01/2019 17:03  Assessment  The primary encounter diagnosis was Acute pain of right lower extremity. 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  Plan of Care  Problem-specific:  No problem-specific Assessment & Plan notes found for this encounter.  Mr. QUEST TAVENNER has a current medication list which includes the following long-term medication(s): cetirizine, hydrocodone-acetaminophen, [START ON 04/18/2019] hydrocodone-acetaminophen, [START ON 05/18/2019]  hydrocodone-acetaminophen, isosorbide mononitrate, losartan, methocarbamol, nitroglycerin, pregabalin, rosuvastatin, and tadalafil.  Pharmacotherapy (Medications Ordered): Meds ordered this encounter  Medications  . pregabalin (LYRICA) 150 MG capsule    Sig: Take 1 capsule (150 mg total) by mouth 3 (three) times daily.    Dispense:  90 capsule    Refill:  5    Fill one day early if pharmacy is closed on scheduled refill date. May substitute for generic if available.   Orders:  Orders Placed This Encounter  Procedures  . Misc procedure    Scheduling Instructions:     Type of Block:  Palliative/diagnostic right-sidedCommon Peroneal NB#3  Side: Right-sided     Sedation: With Sedation.     Timeframe: ASAA   Follow-up plan:   Return for Procedure (w/ sedation): (R)Common Peroneal NB#3.      Interventional management options: Considering:   Possible bilateral cervical facet RFA Diagnostic right ankle block Diagnosticright lumbar sympathetic block Diagnostic midlineTESI Diagnostic bilateral thoracic facet block Possible bilateral thoracic facet RFA   Palliative PRN treatment(s):   Palliative left CESI #4 Diagnostic bilateral cervical facet block#3 Palliative/diagnostic right-sidedCommon Peroneal NB#3 Palliative right-sided common peroneal nerve RFA #2(last done on 06/21/2017)      Recent Visits Date Type Provider Dept  03/21/19 [place] [place]  03/08/19 Procedure visit Milinda Pointer, MD [facility]  03/07/19 Telemedicine Milinda Pointer, MD [facility]  01/25/19 Telemedicine Milinda Pointer, MD [facility]  Showing recent visits within past 90 days and meeting all other requirements   Today's Visits Date Type Provider Dept  04/12/19 Telemedicine Milinda Pointer, MD [facility]  Showing today's visits and meeting all other requirements   Future  Appointments Date Type Provider Dept  06/06/19 Appointment Milinda Pointer, MD [facility]  Showing future appointments within next 90 days and meeting all other requirements   I discussed the assessment and treatment plan with the patient. The patient was provided an opportunity to ask questions and all were answered. The patient agreed with the plan and demonstrated an understanding of the instructions.  Patient advised to call back or seek an in-person evaluation if the symptoms or condition worsens.  Duration of encounter: 15 minutes.  Note by: Gaspar Cola, MD Date: 04/12/2019; Time: 8:33 AM

## 2019-04-13 ENCOUNTER — Other Ambulatory Visit: Payer: Self-pay

## 2019-04-14 ENCOUNTER — Other Ambulatory Visit: Payer: Self-pay | Admitting: Internal Medicine

## 2019-04-18 ENCOUNTER — Telehealth: Payer: Self-pay | Admitting: Acute Care

## 2019-04-18 ENCOUNTER — Telehealth: Payer: Medicaid Other | Admitting: Pain Medicine

## 2019-04-18 NOTE — Telephone Encounter (Signed)
Spoke with pt, he states the pharmacy is telling him that he needs approval for his Anoro to be filled. I called [facility] and spoke with the pharmacist and he stated the patient needed a PA for Anoro. I called [facility] at [phone]  Pt made aware. Nothing further is needed.    Approval code: [id number] Good until 04/18/2019-04/12/2020

## 2019-04-23 ENCOUNTER — Telehealth: Payer: Self-pay | Admitting: Pain Medicine

## 2019-04-23 ENCOUNTER — Telehealth: Payer: Medicaid Other | Admitting: Family Medicine

## 2019-04-23 NOTE — Telephone Encounter (Signed)
[name] and they states that they do have his prescription and would fill it.  Patient notified.

## 2019-04-23 NOTE — Telephone Encounter (Signed)
Patient left several vmails on Thur and Fri. Regarding medication problem. Please call patient

## 2019-04-24 DIAGNOSIS — G4733 Obstructive sleep apnea (adult) (pediatric): Secondary | ICD-10-CM | POA: Diagnosis not present

## 2019-04-29 NOTE — Progress Notes (Signed)
This encounter was created in error - please disregard.

## 2019-05-03 ENCOUNTER — Ambulatory Visit
Admission: RE | Admit: 2019-05-03 | Discharge: 2019-05-03 | Disposition: A | Discharge status: Home / Self Care | Payer: Medicaid Other | Source: Ambulatory Visit | Attending: Pain Medicine | Admitting: Pain Medicine

## 2019-05-03 ENCOUNTER — Other Ambulatory Visit: Payer: Self-pay

## 2019-05-03 ENCOUNTER — Ambulatory Visit (HOSPITAL_BASED_OUTPATIENT_CLINIC_OR_DEPARTMENT_OTHER): Payer: Medicaid Other | Admitting: Pain Medicine

## 2019-05-03 ENCOUNTER — Encounter: Payer: Self-pay | Admitting: Pain Medicine

## 2019-05-03 VITALS — BP 124/85 | HR 71 | Temp 98.1°F | Resp 16 | Ht 68.0 in | Wt 200.0 lb

## 2019-05-03 DIAGNOSIS — M25571 Pain in right ankle and joints of right foot: Secondary | ICD-10-CM | POA: Diagnosis not present

## 2019-05-03 DIAGNOSIS — G5731 Lesion of lateral popliteal nerve, right lower limb: Secondary | ICD-10-CM | POA: Diagnosis not present

## 2019-05-03 DIAGNOSIS — S8411XS Injury of peroneal nerve at lower leg level, right leg, sequela: Secondary | ICD-10-CM | POA: Insufficient documentation

## 2019-05-03 DIAGNOSIS — G8929 Other chronic pain: Secondary | ICD-10-CM | POA: Insufficient documentation

## 2019-05-03 DIAGNOSIS — M792 Neuralgia and neuritis, unspecified: Secondary | ICD-10-CM

## 2019-05-03 DIAGNOSIS — M79671 Pain in right foot: Secondary | ICD-10-CM

## 2019-05-03 MED ORDER — METHYLPREDNISOLONE ACETATE 80 MG/ML IJ SUSP
INTRAMUSCULAR | Status: AC
  Filled 2019-05-03: qty 1

## 2019-05-03 MED ORDER — LACTATED RINGERS IV SOLN
1000.0000 mL | Freq: Once | INTRAVENOUS | Status: AC
  Administered 2019-05-03: 1000 mL via INTRAVENOUS

## 2019-05-03 MED ORDER — FENTANYL CITRATE (PF) 100 MCG/2ML IJ SOLN
INTRAMUSCULAR | Status: AC
  Filled 2019-05-03: qty 2

## 2019-05-03 MED ORDER — ROPIVACAINE HCL 2 MG/ML IJ SOLN
INTRAMUSCULAR | Status: AC
  Filled 2019-05-03: qty 10

## 2019-05-03 MED ORDER — METHYLPREDNISOLONE ACETATE 80 MG/ML IJ SUSP
80.0000 mg | Freq: Once | INTRAMUSCULAR | Status: AC
  Administered 2019-05-03: 80 mg

## 2019-05-03 MED ORDER — LIDOCAINE HCL 2 % IJ SOLN
20.0000 mL | Freq: Once | INTRAMUSCULAR | Status: AC
  Administered 2019-05-03: 400 mg

## 2019-05-03 MED ORDER — LIDOCAINE HCL 2 % IJ SOLN
INTRAMUSCULAR | Status: AC
  Filled 2019-05-03: qty 20

## 2019-05-03 MED ORDER — FENTANYL CITRATE (PF) 100 MCG/2ML IJ SOLN
25.0000 ug | INTRAMUSCULAR | Status: DC | PRN
  Administered 2019-05-03: 50 ug via INTRAVENOUS

## 2019-05-03 MED ORDER — MIDAZOLAM HCL 5 MG/5ML IJ SOLN
1.0000 mg | INTRAMUSCULAR | Status: DC | PRN
  Administered 2019-05-03: 10:00:00 2 mg via INTRAVENOUS

## 2019-05-03 MED ORDER — ROPIVACAINE HCL 2 MG/ML IJ SOLN
10.0000 mL | Freq: Once | INTRAMUSCULAR | Status: AC
  Administered 2019-05-03: 9 mL

## 2019-05-03 MED ORDER — MIDAZOLAM HCL 5 MG/5ML IJ SOLN
INTRAMUSCULAR | Status: AC
  Filled 2019-05-03: qty 5

## 2019-05-03 NOTE — Progress Notes (Signed)
Safety precautions to be maintained throughout the outpatient stay will include: orient to surroundings, keep bed in low position, maintain call bell within reach at all times, provide assistance with transfer out of bed and ambulation.  

## 2019-05-03 NOTE — Patient Instructions (Signed)

## 2019-05-03 NOTE — Progress Notes (Signed)
PROVIDER NOTE: Information contained herein reflects review and annotations entered in association with encounter. Interpretation of such information and data should be left to medically-trained personnel. Information provided to patient can be located elsewhere in the medical record under "Patient Instructions". Document created using STT-dictation technology, any transcriptional errors that may result from process are unintentional.    Patient: Caleb [name]  Service Category: Procedure  Provider: Gaspar Cola, MD  DOB: 02/28/1955  DOS: 05/03/2019  Location: [facility]  MRN: [id number]  Setting: Ambulatory - outpatient  Referring Provider: Virginia Crews, MD  Type: Established Patient  Specialty: Interventional Pain Management  PCP: Virginia Crews, MD   Primary Reason for Visit: Interventional Pain Management Treatment. CC: Foot Pain (right)  Procedure:          Anesthesia, Analgesia, Anxiolysis:  Type: Therapeutic Deep peroneal nerve block #1 Region: Anteromedial aspect of fibula Level: Distal lower extremity. Laterality: Right-Side  Type: Moderate (Conscious) Sedation combined with Local Anesthesia Indication(s): Analgesia and Anxiety Local Anesthetic: Lidocaine 1-2% Route: Intravenous (IV) IV Access: Secured Sedation: Meaningful verbal contact was maintained at all times during the procedure   Position: Supine   Indications: 1. Chronic foot pain (Primary Area of Pain) (Right)   2. Chronic ankle pain (Secondary Area of Pain) (Right)   3. Disorder of superficial peroneal nerve (Right)   4. 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 Neurogenic foot pain (Right)    Pain Score: Pre-procedure: 4 /10 Post-procedure: 0-No pain/10   Pre-op Assessment:  Caleb [name] is a 64 y.o. (year old), male patient, seen today for interventional treatment. He  has a past surgical history that includes Cardiac catheterization (2015); Lumbar laminectomy (1989,  1999); Cervical fusion (1988, 1998); Liver surgery (2015); Partial colectomy (1990); Inguinal hernia repair (Bilateral, 1975); Lithotripsy; and Colonoscopy with propofol (N/A, 02/24/2017). Caleb [name] has a current medication list which includes the following prescription(s): anoro ellipta, aspirin ec, cetirizine, fluticasone, hydrochlorothiazide, hydrocodone-acetaminophen, [START ON 05/18/2019] hydrocodone-acetaminophen, isosorbide mononitrate, losartan, methocarbamol, mometasone, naloxone, neomycin-polymyxin-hydrocortisone, nitroglycerin, nutritional supplements, pregabalin, rosuvastatin, secukinumab, tadalafil, and hydrocodone-acetaminophen, and the following Facility-Administered Medications: fentanyl and midazolam. His primarily concern today is the Foot Pain (right)  Initial Vital Signs:  Pulse/HCG Rate: 71ECG Heart Rate: 69 Temp: 98.6 F (37 C) Resp: 16 BP: 123/90 SpO2: 98 %  BMI: Estimated body mass index is 30.41 kg/m as calculated from the following:   Height as of this encounter: 5\' 8"  (1.727 m).   Weight as of this encounter: 200 lb (90.7 kg).  Risk Assessment: Allergies: Reviewed. He is allergic to lisinopril and oxycodone.  Allergy Precautions: None required Coagulopathies: Reviewed. None identified.  Blood-thinner therapy: None at this time Active Infection(s): Reviewed. None identified. Caleb [name] is afebrile  Site Confirmation: Caleb [name] was asked to confirm the procedure and laterality before marking the site Procedure checklist: Completed Consent: Before the procedure and under the influence of no sedative(s), amnesic(s), or anxiolytics, the patient was informed of the treatment options, risks and possible complications. To fulfill our ethical and legal obligations, as recommended by the American Medical Association's Code of Ethics, I have informed the patient of my clinical impression; the nature and purpose of the treatment or procedure; the risks, benefits, and possible  complications of the intervention; the alternatives, including doing nothing; the risk(s) and benefit(s) of the alternative treatment(s) or procedure(s); and the risk(s) and benefit(s) of doing nothing. The patient was provided information about the general risks and possible complications associated with  the procedure. These may include, but are not limited to: failure to achieve desired goals, infection, bleeding, organ or nerve damage, allergic reactions, paralysis, and death. In addition, the patient was informed of those risks and complications associated to the procedure, such as failure to decrease pain; infection; bleeding; organ or nerve damage with subsequent damage to sensory, motor, and/or autonomic systems, resulting in permanent pain, numbness, and/or weakness of one or several areas of the body; allergic reactions; (i.e.: anaphylactic reaction); and/or death. Furthermore, the patient was informed of those risks and complications associated with the medications. These include, but are not limited to: allergic reactions (i.e.: anaphylactic or anaphylactoid reaction(s)); adrenal axis suppression; blood sugar elevation that in diabetics may result in ketoacidosis or comma; water retention that in patients with history of congestive heart failure may result in shortness of breath, pulmonary edema, and decompensation with resultant heart failure; weight gain; swelling or edema; medication-induced neural toxicity; particulate matter embolism and blood vessel occlusion with resultant organ, and/or nervous system infarction; and/or aseptic necrosis of one or more joints. Finally, the patient was informed that Medicine is not an exact science; therefore, there is also the possibility of unforeseen or unpredictable risks and/or possible complications that may result in a catastrophic outcome. The patient indicated having understood very clearly. We have given the patient no guarantees and we have made no  promises. Enough time was given to the patient to ask questions, all of which were answered to the patient's satisfaction. Caleb [name] has indicated that he wanted to continue with the procedure. Attestation: I, the ordering provider, attest that I have discussed with the patient the benefits, risks, side-effects, alternatives, likelihood of achieving goals, and potential problems during recovery for the procedure that I have provided informed consent. Date  Time: 05/03/2019  9:33 AM  Pre-Procedure Preparation:  Monitoring: As per clinic protocol. Respiration, ETCO2, SpO2, BP, heart rate and rhythm monitor placed and checked for adequate function Safety Precautions: Patient was assessed for positional comfort and pressure points before starting the procedure. Time-out: I initiated and conducted the "Time-out" before starting the procedure, as per protocol. The patient was asked to participate by confirming the accuracy of the "Time Out" information. Verification of the correct person, site, and procedure were performed and confirmed by me, the nursing staff, and the patient. "Time-out" conducted as per Joint Commission's Universal Protocol (UP.01.01.01). Time: 1016  Description of Procedure:          Target Area: Distal deep peroneal nerve and ventral medial aspect on the distal one third of his right lower extremity. Approach: Percutaneous approach. Area Prepped: Entire distal anterolateral lower extremity region DuraPrep (Iodine Povacrylex [0.7% available iodine] and Isopropyl Alcohol, 74% w/w) Safety Precautions: Aspiration looking for blood return was conducted prior to all injections. At no point did we inject any substances, as a needle was being advanced. No attempts were made at seeking any paresthesias. Safe injection practices and needle disposal techniques used. Medications properly checked for expiration dates. SDV (single dose vial) medications used. Description of the Procedure: Protocol  guidelines were followed. The patient was placed in position. The target area was identified and the area prepped in the usual manner. Skin & deeper tissues infiltrated with local anesthetic. Appropriate amount of time allowed to pass for local anesthetics to take effect. The procedure needles were then advanced to the target area. Proper needle placement secured. Negative aspiration confirmed. Solution injected in intermittent fashion, asking for systemic symptoms every 0.5cc of injectate. The needles  were then removed and the area cleansed, making sure to leave some of the prepping solution back to take advantage of its long term bactericidal properties.         Vitals:   05/03/19 1035 05/03/19 1045 05/03/19 1055 05/03/19 1105  BP: 129/86 111/74 127/86 124/85  Pulse:      Resp: 12 17 16 16   Temp:  98.3 F (36.8 C)  98.1 F (36.7 C)  TempSrc:      SpO2: 96% 98% 96% 97%  Weight:      Height:        Start Time: 1016 hrs. End Time: 1034 hrs.  Materials:  Needle(s) Type: Spinal Needle Gauge: 22G Length: 3.5-in Medication(s): Please see orders for medications and dosing details.  Imaging Guidance (Non-Spinal):          Type of Imaging Technique: Fluoroscopy Guidance (Non-Spinal) Indication(s): Assistance in needle guidance and placement for procedures requiring needle placement in or near specific anatomical locations not easily accessible without such assistance. Exposure Time: Please see nurses notes. Contrast: None used. Fluoroscopic Guidance: I was personally present during the use of fluoroscopy. "Tunnel Vision Technique" used to obtain the best possible view of the target area. Parallax error corrected before commencing the procedure. "Direction-depth-direction" technique used to introduce the needle under continuous pulsed fluoroscopy. Once target was reached, antero-posterior, oblique, and lateral fluoroscopic projection used confirm needle placement in all planes. Images  permanently stored in EMR. Interpretation: No contrast injected. I personally interpreted the imaging intraoperatively. Adequate needle placement confirmed in multiple planes. Permanent images saved into the patient's record.  Antibiotic Prophylaxis:   Anti-infectives (From admission, onward)   None     Indication(s): None identified  Post-operative Assessment:  Post-procedure Vital Signs:  Pulse/HCG Rate: 7173 Temp: 98.1 F (36.7 C) Resp: 16 BP: 124/85 SpO2: 97 %  EBL: None  Complications: No immediate post-treatment complications observed by team, or reported by patient.  Note: The patient tolerated the entire procedure well. A repeat set of vitals were taken after the procedure and the patient was kept under observation following institutional policy, for this type of procedure. Post-procedural neurological assessment was performed, showing return to baseline, prior to discharge. The patient was provided with post-procedure discharge instructions, including a section on how to identify potential problems. Should any problems arise concerning this procedure, the patient was given instructions to immediately contact us, at any time, without hesitation. In any case, we plan to contact the patient by telephone for a follow-up status report regarding this interventional procedure.  Comments:  No additional relevant information.  Plan of Care  Orders:  Orders Placed This Encounter  Procedures  . Misc procedure    Scheduling Instructions:     Type of Block: Right common peroneal nerve block     Side: Right-sided     Sedation: With Sedation.     Timeframe: Today  . DG [facility] C-ARM 1-60 MIN NO REPORT    Intraoperative interpretation by procedural physician at [place].    Standing Status:   Standing    Number of Occurrences:   1    Order Specific Question:   Reason for exam:    Answer:   Assistance in needle guidance and placement for procedures requiring  needle placement in or near specific anatomical locations not easily accessible without such assistance.  . Informed Consent Details: Physician/Practitioner Attestation; Transcribe to consent form and obtain patient signature    Provider Attestation: I, [place] Dossie Arbour, MD, (  Pain Management Specialist), the physician/practitioner, attest that I have discussed with the patient the benefits, risks, side effects, alternatives, likelihood of achieving goals and potential problems during recovery for the procedure that I have provided informed consent.    Scheduling Instructions:     Procedure: Right common peroneal nerve block     Indication/Reason: Right ankle and foot pain secondary to injury to the right common peroneal nerve     Note: Always confirm laterality of pain with Mr. Remillard, before procedure.  . Provide equipment / supplies at bedside    Equipment required: Single use, disposable, "Block Tray"    Standing Status:   Standing    Number of Occurrences:   1    Order Specific Question:   Specify    Answer:   Block Tray   Chronic Opioid Analgesic:  Hydrocodone/APAP 5/325 1 tablet every 6 hours (20 mg/day of hydrocodone).  (Unable to tolerate an oxycodone IR trial due to stomachaches, nausea, headaches, and excessive somnolence.) MME/day: 20 mg/day.   Medications ordered for procedure: Meds ordered this encounter  Medications  . lidocaine (XYLOCAINE) 2 % (with pres) injection 400 mg  . lactated ringers infusion 1,000 mL  . midazolam (VERSED) 5 MG/5ML injection 1-2 mg    Make sure Flumazenil is available in the pyxis when using this medication. If oversedation occurs, administer 0.2 mg IV over 15 sec. If after 45 sec no response, administer 0.2 mg again over 1 min; may repeat at 1 min intervals; not to exceed 4 doses (1 mg)  . fentaNYL (SUBLIMAZE) injection 25-50 mcg    Make sure Narcan is available in the pyxis when using this medication. In the event of respiratory depression (RR<  8/min): Titrate NARCAN (naloxone) in increments of 0.1 to 0.2 mg IV at 2-3 minute intervals, until desired degree of reversal.  . methylPREDNISolone acetate (DEPO-MEDROL) injection 80 mg  . ropivacaine (PF) 2 mg/mL (0.2%) (NAROPIN) injection 10 mL   Medications administered: We administered lidocaine, lactated ringers, midazolam, fentaNYL, methylPREDNISolone acetate, and ropivacaine (PF) 2 mg/mL (0.2%).  See the medical record for exact dosing, route, and time of administration.  Follow-up plan:   Return in about 2 weeks (around 05/17/2019) for (PP), (VV).       Interventional management options: Considering:   Possible bilateral cervical facet RFA Diagnostic right ankle block Diagnosticright lumbar sympathetic block Diagnostic midlineTESI Diagnostic bilateral thoracic facet block Possible bilateral thoracic facet RFA   Palliative PRN treatment(s):   Palliative left CESI #4 Diagnostic bilateral cervical facet block#3 Palliative/diagnostic right-sidedCommon Peroneal NB#3 Palliative right-sided common peroneal nerve RFA #2(last done on 06/21/2017)       Recent Visits Date Type Provider Dept  04/12/19 Telemedicine Milinda Pointer, [facility]  03/21/19 Telemedicine Milinda Pointer, MD [facility]  03/08/19 Procedure visit Milinda Pointer, MD [facility]  03/07/19 Telemedicine Milinda Pointer, MD [facility]  Showing recent visits within past 90 days and meeting all other requirements   Today's Visits Date Type Provider Dept  05/03/19 Procedure visit Milinda Pointer, MD [facility]  Showing today's visits and meeting all other requirements   Future Appointments Date Type Provider Dept  05/23/19 Appointment Milinda Pointer, MD [facility]  06/06/19 Appointment Milinda Pointer, MD [facility]  Showing future appointments within next 90 days and meeting all other  requirements   Disposition: Discharge home  Discharge (Date  Time): 05/03/2019; 1111 hrs.   Primary Care Physician: Virginia Crews, MD Location:  [facility] Note by: Gaspar Cola, MD Date: 05/03/2019; Time: 12:03 PM  Disclaimer:  Medicine is not an Chief Strategy Officer. The only guarantee in medicine is that nothing is guaranteed. It is important to note that the decision to proceed with this intervention was based on the information collected from the patient. The Data and conclusions were drawn from the patient's questionnaire, the interview, and the physical examination. Because the information was provided in large part by the patient, it cannot be guaranteed that it has not been purposely or unconsciously manipulated. Every effort has been made to obtain as much relevant data as possible for this evaluation. It is important to note that the conclusions that lead to this procedure are derived in large part from the available data. Always take into account that the treatment will also be dependent on availability of resources and existing treatment guidelines, considered by other Pain Management Practitioners as being common knowledge and practice, at the time of the intervention. For Medico-Legal purposes, it is also important to point out that variation in procedural techniques and pharmacological choices are the acceptable norm. The indications, contraindications, technique, and results of the above procedure should only be interpreted and judged by a Board-Certified Interventional Pain Specialist with extensive familiarity and expertise in the same exact procedure and technique.

## 2019-05-04 ENCOUNTER — Telehealth: Payer: Self-pay

## 2019-05-04 NOTE — Telephone Encounter (Signed)
Post procedure phone call.  LM 

## 2019-05-08 ENCOUNTER — Telehealth: Payer: Self-pay | Admitting: Acute Care

## 2019-05-08 NOTE — Telephone Encounter (Signed)
Called patient. He verbalized understanding and stated that he was already scheduled for a follow up with Judson Roch on 5/12 as a televisit.

## 2019-05-08 NOTE — Telephone Encounter (Signed)
   Patient has OSA. They are recommending options for different therapies. Please make an appointment with Dr. Elsworth Soho or me to review results and go over therapy options as soon as availability. Thanks so much.  Magdalen Spatz, MSN, AGACNP-BC [place] Please see Amion for assignments and pager After 4 pm please call [phone]

## 2019-05-08 NOTE — Telephone Encounter (Signed)
Patients is calling about his sleep study results. Sarah please advise

## 2019-05-14 NOTE — Telephone Encounter (Signed)
LMTCB  SG please advise of Sleep study results.   Thanks

## 2019-05-15 NOTE — Telephone Encounter (Signed)
Please see earlier telephone note dated 5/4.Marland Kitchen He was called by Wallene Dales 5/4. He has a tele visit with me tomorrow to discuss options for therapy. Thanks so much

## 2019-05-16 ENCOUNTER — Other Ambulatory Visit: Payer: Self-pay

## 2019-05-16 ENCOUNTER — Telehealth: Payer: Medicaid Other | Admitting: Pain Medicine

## 2019-05-16 ENCOUNTER — Ambulatory Visit (INDEPENDENT_AMBULATORY_CARE_PROVIDER_SITE_OTHER): Payer: Medicaid Other | Admitting: Acute Care

## 2019-05-16 ENCOUNTER — Telehealth: Payer: Self-pay | Admitting: Acute Care

## 2019-05-16 ENCOUNTER — Encounter: Payer: Self-pay | Admitting: Acute Care

## 2019-05-16 DIAGNOSIS — G4733 Obstructive sleep apnea (adult) (pediatric): Secondary | ICD-10-CM

## 2019-05-16 DIAGNOSIS — Z9989 Dependence on other enabling machines and devices: Secondary | ICD-10-CM

## 2019-05-16 NOTE — Addendum Note (Signed)
Addended by: Valerie Salts on: 05/16/2019 05:38 PM   Modules accepted: Orders

## 2019-05-16 NOTE — Patient Instructions (Signed)
It was good to talk with you today. Your sleep study confirms sleep apnea and recommends BiPAP therapy. We will order BiPAP Supplies/ mask of choice Settings of Auto BiPAP PS +4, IPAP Max of 19 EPAP min of 8 Follow up within 1-2 months with Judson Roch NP or Dr. Elsworth Soho to ensure effectiveness of therapy   Wear  BiPAP at bedtime every night without fail. Goal is to wear for at least 6 hours each night for maximal clinical benefit. Continue to work on weight loss, as the link between excess weight  and sleep apnea is well established.   Remember to establish a good bedtime routine, and work on sleep hygiene.  Limit daytime naps , avoid stimulants such as caffeine and nicotine close to bedtime, exercise daily to promote sleep quality, avoid heavy , spicy, fried , or rich foods before bed. Ensure adequate exposure to natural light during the day,establish a relaxing bedtime routine with a pleasant sleep environment ( Bedroom between 60 and 67 degrees, turn off bright lights , TV or device screens screens , consider black out curtains or white noise machines) Do not drive if sleepy. Remember to clean mask, tubing, filter, and reservoir once weekly with soapy water.  Follow up within 1-2 months with Judson Roch NP or Dr. Elsworth Soho to ensure effectiveness of therapy

## 2019-05-16 NOTE — Telephone Encounter (Signed)
Please place an order with Mr. [facility] for BiPAP Sleep Study in Epic documents need Mask of choice Equipment Setting of Auto BiPAP  PS +4 IPAP max of 19 EPAP min of 8 He will need a follow up appointment with Judson Roch NP or Dr. Elsworth Soho within 1-2 months of initiation of BiPAP, with down Load to ensure improvement. Thanks

## 2019-05-16 NOTE — Telephone Encounter (Signed)
Order has been placed. Recall placed for F/U with RA.

## 2019-05-16 NOTE — Progress Notes (Signed)
Virtual Visit via Telephone Note  I connected with Caleb [name] on 05/16/19 at  2:30 PM EDT by telephone and verified that I am speaking with the correct person using two identifiers.  Location: Patient: At home Provider: Working remotely from home   I discussed the limitations, risks, security and privacy concerns of performing an evaluation and management service by telephone and the availability of in person appointments. I also discussed with the patient that there may be a patient responsible charge related to this service. The patient expressed understanding and agreed to proceed.   History of Present Illness: Pt. Presents for follow up of CPAP titration study. He has been on CPAP for about 2 years, but he has had a hard time using it as he feels like he cannot breath out while using it, and he has a sensation of choking. We did a CPAP titration to determine if setting was the issue. The result was as noted below. He is in agreement with trying BiPA. He states he did sleep well in the sleep lab during the trial. He is aware of the cardiac risks associated with untreated sleep apnea, and he is looking forward to trying BiPAP to see if he gets improved sleep. He is very concerned about the risks of untreated OSA. He is anxious to get started on BiPAP therapy.   Observations/Objective:   Saturation drop to low of 66% Total of 213 desaturation events 55 minutes below saturation of 90% 39 minutes below saturation of 88% PLMS index 50.1 512 episodes of snoring   Assessment and Plan: OSA  Per CPAP  titration Needs BiPAP Plan We will order BiPAP Supplies/ mask of choice Settings of Auto BiPAP PS +4, IPAP Max of 19 EPAP min of 8 Follow up within 1-2 months with Judson Roch NP or Dr. Elsworth Soho to ensure effectiveness of therapy.   Wear  BiPAP at bedtime. You appear to be benefiting from the treatment  Goal is to wear for at least 6 hours each night for maximal clinical benefit. Continue  to work on weight loss, as the link between excess weight  and sleep apnea is well established.   Remember to establish a good bedtime routine, and work on sleep hygiene.  Limit daytime naps , avoid stimulants such as caffeine and nicotine close to bedtime, exercise daily to promote sleep quality, avoid heavy , spicy, fried , or rich foods before bed. Ensure adequate exposure to natural light during the day,establish a relaxing bedtime routine with a pleasant sleep environment ( Bedroom between 60 and 67 degrees, turn off bright lights , TV or device screens screens , consider black out curtains or white noise machines) Do not drive if sleepy. Remember to clean mask, tubing, filter, and reservoir once weekly with soapy water.  Follow up with Judson Roch NP or Dr. Elsworth Soho   In 1-2 months  or before as needed.  with down Load  Follow Up Instructions: Follow up with Judson Roch NP or Dr. Elsworth Soho   In 1-2 months  or before as needed.  with down Load     I discussed the assessment and treatment plan with the patient. The patient was provided an opportunity to ask questions and all were answered. The patient agreed with the plan and demonstrated an understanding of the instructions.   The patient was advised to call back or seek an in-person evaluation if the symptoms worsen or if the condition fails to improve as anticipated.  I provided 35  minutes  of non-face-to-face time during this encounter.   Magdalen Spatz, NP 05/16/2019

## 2019-05-18 ENCOUNTER — Telehealth: Payer: Self-pay | Admitting: Acute Care

## 2019-05-18 DIAGNOSIS — G4733 Obstructive sleep apnea (adult) (pediatric): Secondary | ICD-10-CM

## 2019-05-18 NOTE — Telephone Encounter (Signed)
I have received the following result from [employer] regarding the BiPap DME order placed on 5/12:  Caleb [name] sent to Caleb [name], Caleb [name], Caleb [name]; Caleb [name]; 1 other  Caleb [name], this patient is already on BIPAP that he received 05/20/2017 from [name]). Set up on pressure setting of 20/14.   He is not eligible for a new machine...all we can do is change the pressure to the auto setting that is indicated on the order, which is Max IPAP 19, Min EPAP 8, PS 4. He last received supplies in Jan 2021.  Please advise if we are just to change pressure or if the provider wants to do something different.  Thank you  Caleb [name]        Please advise how to proceed.

## 2019-05-18 NOTE — Telephone Encounter (Signed)
Caleb [name] was pt changing from CPAP to Bipap/ I think this is were it is confusing.

## 2019-05-18 NOTE — Telephone Encounter (Signed)
Yes. Have them read the sleep study. Was on CPAP, now  Needs BiPAP. Thanks

## 2019-05-22 NOTE — Telephone Encounter (Signed)
If he already has Bipap then we just need to make sure the settings are: Setting of Auto BiPAP  PS +4 IPAP max of 19 EPAP min of 8 He will need a follow up appointment with Judson Roch NP or Dr. Elsworth Soho within 1-2 months of initiation of BiPAP, with down Load to ensure im

## 2019-05-23 ENCOUNTER — Other Ambulatory Visit: Payer: Self-pay

## 2019-05-23 ENCOUNTER — Ambulatory Visit (HOSPITAL_BASED_OUTPATIENT_CLINIC_OR_DEPARTMENT_OTHER): Payer: Medicaid Other | Admitting: Pain Medicine

## 2019-05-23 ENCOUNTER — Encounter: Payer: Self-pay | Admitting: Pain Medicine

## 2019-05-23 ENCOUNTER — Telehealth: Payer: Self-pay | Admitting: Pain Medicine

## 2019-05-23 DIAGNOSIS — G894 Chronic pain syndrome: Secondary | ICD-10-CM

## 2019-05-23 NOTE — Telephone Encounter (Signed)
PA sent to [facility] via fax 05/23/19. Patient called and informed.

## 2019-05-23 NOTE — Telephone Encounter (Signed)
Patient tried to pick up medications and was told he had to have another prior [place]. He is out of meds. Please let him know status.  Thank you

## 2019-05-23 NOTE — Progress Notes (Signed)
Unsuccessful attempt to contact patient for Virtual Visit (Pain Management Telehealth)   Patient provided contact information:  [phone] (home); [phone] (mobile); (Preferred) [phone] [email or web address]   Pre-screening:  Our staff was successful in contacting Caleb [name] using the above provided information.   I unsuccessfully attempted to make contact with Caleb [name] on 05/23/2019 via telephone. I was unable to complete the virtual encounter due to call going directly to voicemail. I was able to leave a message.  Post-Procedure Evaluation  Procedure: Therapeutic right-sided deep peroneal nerve block #1 under fluoroscopic guidance and IV sedation Pre-procedure pain level: 4/10 Post-procedure: 0/10 (100% relief)  Sedation: Sedation provided.  Effectiveness during initial hour after procedure(Ultra-Short Term Relief): 100 % .  Local anesthetic used: Long-acting (4-6 hours) Effectiveness: Defined as any analgesic benefit obtained secondary to the administration of local anesthetics. This carries significant diagnostic value as to the etiological location, or anatomical origin, of the pain. Duration of benefit is expected to coincide with the duration of the local anesthetic used.  Effectiveness during initial 4-6 hours after procedure(Short-Term Relief): 100 % .  Long-term benefit: Defined as any relief past the pharmacologic duration of the local anesthetics.  Effectiveness past the initial 6 hours after procedure(Long-Term Relief): 50 % .  Pharmacotherapy Assessment  Analgesic: Hydrocodone/APAP 5/325 1 tablet every 6 hours (20 mg/day of hydrocodone).  (Unable to tolerate an oxycodone IR trial due to stomachaches, nausea, headaches, and excessive somnolence.) MME/day: 20 mg/day.   Follow-up plan:   Reschedule Visit.     Interventional management options: Considering:   Possible bilateral cervical facet RFA Diagnostic right ankle block Diagnosticright lumbar  sympathetic block Diagnostic midlineTESI Diagnostic bilateral thoracic facet block Possible bilateral thoracic facet RFA   Palliative PRN treatment(s):   Palliative left CESI #4 Diagnostic bilateral cervical facet block#3 Palliative/diagnostic right-sidedCommon Peroneal NB#3 Therapeutic right-sided deep peroneal nerve block #2  Palliative right-sided common peroneal nerve RFA #2(last done on 06/21/2017)    Recent Visits Date Type Provider Dept  05/03/19 Procedure visit Milinda Pointer, MD [facility]  04/12/19 Telemedicine Milinda Pointer, [facility]  03/21/19 Telemedicine Milinda Pointer, MD [facility]  03/08/19 Procedure visit Milinda Pointer, MD [facility]  03/07/19 Telemedicine Milinda Pointer, MD [facility]  Showing recent visits within past 90 days and meeting all other requirements   Future Appointments Date Type Provider Dept  06/06/19 Appointment Milinda Pointer, MD [facility]  Showing future appointments within next 90 days and meeting all other requirements    Note by: Gaspar Cola, MD Date: 05/23/2019; Time: 11:58 AM

## 2019-05-24 ENCOUNTER — Other Ambulatory Visit: Payer: Self-pay

## 2019-05-24 ENCOUNTER — Ambulatory Visit: Payer: Medicaid Other | Attending: Pain Medicine | Admitting: Pain Medicine

## 2019-05-24 DIAGNOSIS — G894 Chronic pain syndrome: Secondary | ICD-10-CM | POA: Diagnosis not present

## 2019-05-24 DIAGNOSIS — M79671 Pain in right foot: Secondary | ICD-10-CM

## 2019-05-24 DIAGNOSIS — M7918 Myalgia, other site: Secondary | ICD-10-CM

## 2019-05-24 DIAGNOSIS — M25571 Pain in right ankle and joints of right foot: Secondary | ICD-10-CM

## 2019-05-24 DIAGNOSIS — G8929 Other chronic pain: Secondary | ICD-10-CM | POA: Diagnosis not present

## 2019-05-24 MED ORDER — HYDROCODONE-ACETAMINOPHEN 5-325 MG PO TABS: 1.0000 | ORAL_TABLET | Freq: Four times a day (QID) | ORAL | 0 refills | Status: DC | PRN

## 2019-05-24 MED ORDER — METHOCARBAMOL 750 MG PO TABS: 750.0000 mg | ORAL_TABLET | Freq: Three times a day (TID) | ORAL | 5 refills | Status: DC | PRN

## 2019-05-24 NOTE — Progress Notes (Signed)
Patient: Caleb [name]  Service Category: E/M  Provider: Gaspar Cola, MD  DOB: [birth year]  DOS: 05/24/2019  Location: Office  MRN: [id number]  Setting: Ambulatory outpatient  Referring Provider: Virginia Crews, MD  Type: Established Patient  Specialty: Interventional Pain Management  PCP: Virginia Crews, MD  Location: Remote location  Delivery: TeleHealth     Virtual Encounter - Pain Management PROVIDER NOTE: Information contained herein reflects review and annotations entered in association with encounter. Interpretation of such information and data should be left to medically-trained personnel. Information provided to patient can be located elsewhere in the medical record under "Patient Instructions". Document created using STT-dictation technology, any transcriptional errors that may result from process are unintentional.    Contact & Pharmacy Preferred: [phone] Home: [phone] (home) Mobile: [phone] (mobile) E-mail: [email or web address]  [id number] [place]Phone: [phone]: [phone]  Pre-screening  Mr. HArmbrustoffered "in-person" vs "virtual" encounter. He indicated preferring virtual for this encounter.   Reason COVID-19*  Social distancing based on CDC and AMA recommendations.   I contacted RMerian Capronon 05/24/2019 via telephone.      I clearly identified myself as FGaspar Cola MD. I verified that I was speaking with the correct person using two identifiers (Name: RSHALOM MCGUINESS and date of birth: 1January 18, 1957.  Consent I sought verbal advanced consent from [name] virtual visit interactions. I informed Mr. HCastigliaof possible security and privacy concerns, risks, and limitations associated with providing "not-in-person" medical evaluation and management services. I also informed Mr. HBowlbyof the availability of "in-person"  appointments. Finally, I informed him that there would be a charge for the virtual visit and that he could be  personally, fully or partially, financially responsible for it. Mr. HLaraiaexpressed understanding and agreed to proceed.   Historic Elements   Mr. RJAEGER TRUEHEARTis a 64y.o. year old, male patient evaluated today after his last contact with our practice on 05/23/2019. Mr. HKeimig has a past medical history of Chronic foot pain, right (2015), COPD (chronic obstructive pulmonary disease) ([place], Coronary artery disease, Emphysema lung ([place], Hyperlipidemia, Hypertension, Leucocytosis, Myocardial infarction ([place] (2015), and OSA on CPAP. He also  has a past surgical history that includes Cardiac catheterization (2015); Lumbar laminectomy (1989, 1999); Cervical fusion (1988, 1998); Liver surgery (2015); Partial colectomy (1990); Inguinal hernia repair (Bilateral, 1975); Lithotripsy; and Colonoscopy with propofol (N/A, 02/24/2017). Mr. HLaverdierehas a current medication list which includes the following prescription(s): anoro ellipta, aspirin ec, cetirizine, fluticasone, hydrochlorothiazide, [START ON 06/17/2019] hydrocodone-acetaminophen, [START ON 07/17/2019] hydrocodone-acetaminophen, [START ON 08/16/2019] hydrocodone-acetaminophen, isosorbide mononitrate, losartan, [START ON 06/09/2019] methocarbamol, mometasone, naloxone, neomycin-polymyxin-hydrocortisone, nitroglycerin, nutritional supplements, pregabalin, rosuvastatin, secukinumab, and tadalafil. He  reports that he has been smoking cigarettes. He has a 38.25 pack-year smoking history. He has never used smokeless tobacco. He reports current alcohol use of about 4.0 standard drinks of alcohol per week. He reports that he does not use drugs. Mr. HOliverais allergic to lisinopril and oxycodone.   HPI  Today, he is being contacted for both, medication management and a post-procedure assessment.  The patient indicates that after the procedure it felt a lot  better.  However, he has had an episode of severe burning sensation that lasted for 1 day.  He indicates that when he did, he felt his foot and it was cool to the touch however it felt as if it  was on fire.  He denies any swelling or color changes associated with this.  I did talk to him about the possibility of a complex regional pain syndrome.  However, this is something that has occurred only once and hopefully it will not become something permanent.  Today I also spoke to the patient about the possibility of doing a spinal cord stimulator trial since his pain continues to return.  He indicated that he is not familiar with that and therefore I told him that the next time that he comes in we will provide him with some reading material.  He also describes that the biggest problem that he has encountered is that the pharmacy has not given him his hydrocodone and he has been off of it for the past 2 days due to the fact that they claim that it has to have prior authorization done every 6 months.  It is my professional opinion that currently it is medically necessary for this patient to continue on these medications for the time being.  He does have they indications for the use of these opioid analgesics and unfortunately he was unable to tolerate oxycodone trial. The patient indicates doing well with the current medication regimen. No adverse reactions or side effects reported to the medications.   Post-Procedure Evaluation  Procedure: Therapeutic right-sided deep peroneal nerve block #1 under fluoroscopic guidance and IV sedation Pre-procedure pain level: 4/10 Post-procedure: 0/10 (100% relief)  Sedation: Sedation provided.  Effectiveness during initial hour after procedure(Ultra-Short Term Relief): 100 % .  Local anesthetic used: Long-acting (4-6 hours) Effectiveness: Defined as any analgesic benefit obtained secondary to the administration of local anesthetics. This carries significant diagnostic  value as to the etiological location, or anatomical origin, of the pain. Duration of benefit is expected to coincide with the duration of the local anesthetic used.  Effectiveness during initial 4-6 hours after procedure(Short-Term Relief): 100 % .  Long-term benefit: Defined as any relief past the pharmacologic duration of the local anesthetics.  Effectiveness past the initial 6 hours after procedure(Long-Term Relief): 50 % .  Current benefits: Defined as benefit that persist at this time.   Analgesia:  >50% relief Function: Mr. Enberg reports improvement in function ROM: Mr. Greth reports improvement in ROM  Pharmacotherapy Assessment  Analgesic: Hydrocodone/APAP 5/325 1 tablet every 6 hours (20 mg/day of hydrocodone).  (Unable to tolerate an oxycodone IR trial due to stomachaches, nausea, headaches, and excessive somnolence.) MME/day: 20 mg/day.   Monitoring: [place] PMP: PDMP reviewed during this encounter.       Pharmacotherapy: No side-effects or adverse reactions reported. Compliance: No problems identified. Effectiveness: Clinically acceptable. Plan: Refer to "POC".  UDS:  Summary  Date Value Ref Range Status  12/21/2017 FINAL  Final    Comment:    ==================================================================== TOXASSURE SELECT 13 (MW) ==================================================================== Test                             Result       Flag       Units Drug Present and Declared for Prescription Verification   Oxycodone                      276          EXPECTED   ng/mg creat   Oxymorphone                    1086  EXPECTED   ng/mg creat   Noroxycodone                   1214         EXPECTED   ng/mg creat   Noroxymorphone                 397          EXPECTED   ng/mg creat    Sources of oxycodone are scheduled prescription medications.    Oxymorphone, noroxycodone, and noroxymorphone are expected    metabolites of oxycodone. Oxymorphone is also  available as a    scheduled prescription medication. Drug Absent but Declared for Prescription Verification   Hydrocodone                    Not Detected UNEXPECTED ng/mg creat ==================================================================== Test                      Result    Flag   Units      Ref Range   Creatinine              96               mg/dL      >=20 ==================================================================== Declared Medications:  The flagging and interpretation on this report are based on the  following declared medications.  Unexpected results may arise from  inaccuracies in the declared medications.  **Note: The testing scope of this panel includes these medications:  Hydrocodone (Norco)  Oxycodone  **Note: The testing scope of this panel does not include following  reported medications:  Acetaminophen (Norco)  Albuterol  Aspirin (Aspirin 81)  Fluticasone (Flovent)  Hydrochlorothiazide (Hydrodiuril)  Isosorbide (Imdur)  Losartan (Cozaar)  Methocarbamol (Robaxin)  Metoprolol (Lopressor)  Nitroglycerin (Nitrostat)  Pregabalin (Lyrica)  Rosuvastatin (Crestor)  Secukinumab  Tiotropium (Spiriva)  Triamcinolone (Kenalog) ==================================================================== For clinical consultation, please call [phone]. ====================================================================    Laboratory Chemistry Profile   Renal Lab Results  Component Value Date   BUN 16 04/01/2019   CREATININE 0.75 04/01/2019   BCR 17 03/19/2019   GFRAA >60 04/01/2019   GFRNONAA >60 04/01/2019     Hepatic Lab Results  Component Value Date   AST 22 08/30/2018   ALT 23 08/30/2018   ALBUMIN 3.8 08/30/2018   ALKPHOS 71 08/30/2018     Electrolytes Lab Results  Component Value Date   NA 138 04/01/2019   K 3.9 04/01/2019   CL 98 04/01/2019   CALCIUM 9.4 04/01/2019   MG 2.0 05/30/2018     Bone Lab Results  Component Value Date    25OHVITD1 32 02/17/2017   25OHVITD2 <1.0 02/17/2017   25OHVITD3 32 02/17/2017   TESTOFREE 5.2 (L) 02/28/2017   TESTOSTERONE 279 02/28/2017     Inflammation (CRP: Acute Phase) (ESR: Chronic Phase) Lab Results  Component Value Date   CRP 8 11/07/2017   ESRSEDRATE 26 11/07/2017       Note: Above Lab results reviewed.  Imaging  DG PAIN CLINIC C-ARM 1-60 MIN NO REPORT Fluoro was used, but no Radiologist interpretation will be provided.  Please refer to "NOTES" tab for provider progress note.  Assessment  The primary encounter diagnosis was Chronic pain syndrome. Diagnoses of Chronic foot pain (Primary Area of Pain) (Right), Chronic ankle pain (Secondary Area of Pain) (Right), and Chronic musculoskeletal pain were also pertinent to this visit.  Plan of Care  Problem-specific:  No problem-specific Assessment &  Plan notes found for this encounter.  Mr. ELCHONON MAXSON has a current medication list which includes the following long-term medication(s): cetirizine, [START ON 06/17/2019] hydrocodone-acetaminophen, [START ON 07/17/2019] hydrocodone-acetaminophen, [START ON 08/16/2019] hydrocodone-acetaminophen, isosorbide mononitrate, losartan, [START ON 06/09/2019] methocarbamol, nitroglycerin, pregabalin, rosuvastatin, and tadalafil.  Pharmacotherapy (Medications Ordered): Meds ordered this encounter  Medications  . methocarbamol (ROBAXIN) 750 MG tablet    Sig: Take 1 tablet (750 mg total) by mouth every 8 (eight) hours as needed for muscle spasms.    Dispense:  90 tablet    Refill:  5    Fill one day early if pharmacy is closed on scheduled refill date. May substitute for generic if available.  Marland Kitchen HYDROcodone-acetaminophen (NORCO/VICODIN) 5-325 MG tablet    Sig: Take 1 tablet by mouth every 6 (six) hours as needed for severe pain. Must last 30 days    Dispense:  120 tablet    Refill:  0    Chronic Pain: STOP Act (Not applicable) Fill 1 day early if closed on refill date. Do not fill until:  06/17/2019. To last until: 07/17/2019. Avoid benzodiazepines within 8 hours of opioids  . HYDROcodone-acetaminophen (NORCO/VICODIN) 5-325 MG tablet    Sig: Take 1 tablet by mouth every 6 (six) hours as needed for severe pain. Must last 30 days    Dispense:  120 tablet    Refill:  0    Chronic Pain: STOP Act (Not applicable) Fill 1 day early if closed on refill date. Do not fill until: 07/17/2019. To last until: 08/16/2019. Avoid benzodiazepines within 8 hours of opioids  . HYDROcodone-acetaminophen (NORCO/VICODIN) 5-325 MG tablet    Sig: Take 1 tablet by mouth every 6 (six) hours as needed for severe pain. Must last 30 days    Dispense:  120 tablet    Refill:  0    Chronic Pain: STOP Act (Not applicable) Fill 1 day early if closed on refill date. Do not fill until: 08/16/2019. To last until: 09/15/2019. Avoid benzodiazepines within 8 hours of opioids   Orders:  No orders of the defined types were placed in this encounter.  Follow-up plan:   Return in about 4 months (around 09/12/2019) for (F2F), (MM).      Interventional management options: Considering:   Possible spinal cord stimulator trial  Possible bilateral cervical facet RFA Diagnostic right ankle block Diagnosticright lumbar sympathetic block Diagnostic midlineTESI Diagnostic bilateral thoracic facet block Possible bilateral thoracic facet RFA   Palliative PRN treatment(s):   Palliative left CESI #4 Diagnostic bilateral cervical facet block#3 Palliative/diagnostic right-sidedCommon Peroneal NB#3 Therapeutic right-sided deep peroneal nerve block #2  Palliative right-sided common peroneal nerve RFA #2(last done on 06/21/2017)    Recent Visits Date Type Provider Dept  05/03/19 Procedure visit Milinda Pointer, MD [facility]  04/12/19 Telemedicine Milinda Pointer, [facility]  03/21/19 Telemedicine Milinda Pointer, MD [facility]  03/08/19 Procedure visit Milinda Pointer,  MD [facility]  03/07/19 Telemedicine Milinda Pointer, MD [facility]  Showing recent visits within past 90 days and meeting all other requirements   Future Appointments No visits were found meeting these conditions.  Showing future appointments within next 90 days and meeting all other requirements   I discussed the assessment and treatment plan with the patient. The patient was provided an opportunity to ask questions and all were answered. The patient agreed with the plan and demonstrated an understanding of the instructions.  Patient advised to call back or seek an in-person  evaluation if the symptoms or condition worsens.  Duration of encounter: 15 minutes.  Note by: Gaspar Cola, MD Date: 05/24/2019; Time: 1:43 PM

## 2019-05-28 ENCOUNTER — Telehealth: Payer: Self-pay | Admitting: Acute Care

## 2019-05-28 NOTE — Telephone Encounter (Signed)
lmtcb for pt.  

## 2019-05-29 NOTE — Telephone Encounter (Signed)
Spoke with Judson Roch at [facility] regarding prior message. She was able to change patient's setting the ones that was put in the order. Sarah from [employer] is going to contact patient regarding the setting . Nothing else further needed.

## 2019-05-29 NOTE — Telephone Encounter (Signed)
Spoke with patient regarding prior message.Advised patient that a order was sent in to [facility] for BIPAP setting to be changed. Patient has not heard nothing back . I advised patient I will contact [facility] to make sure they got his order. Patient stated he will bring the machine to [facility] if needed. Advised I would call him back after I spoke with [employer].

## 2019-05-30 ENCOUNTER — Other Ambulatory Visit: Payer: Self-pay | Admitting: Family Medicine

## 2019-06-06 ENCOUNTER — Encounter: Payer: Medicaid Other | Admitting: Pain Medicine

## 2019-06-07 ENCOUNTER — Other Ambulatory Visit: Payer: Self-pay | Admitting: Family Medicine

## 2019-06-11 DIAGNOSIS — I10 Essential (primary) hypertension: Secondary | ICD-10-CM | POA: Diagnosis not present

## 2019-06-12 ENCOUNTER — Encounter: Payer: Self-pay | Admitting: Pain Medicine

## 2019-06-12 NOTE — Progress Notes (Signed)
Patient: Caleb [name]  Service Category: E/M  Provider: Gaspar Cola, MD  DOB: 12/09/1955  DOS: 06/13/2019  Location: Office  MRN: [id number]  Setting: Ambulatory outpatient  Referring Provider: Virginia Crews, MD  Type: Established Patient  Specialty: Interventional Pain Management  PCP: Caleb Crews, MD  Location: Remote location  Delivery: TeleHealth     Virtual Encounter - Pain Management PROVIDER NOTE: Information contained herein reflects review and annotations entered in association with encounter. Interpretation of such information and data should be left to medically-trained personnel. Information provided to patient can be located elsewhere in the medical record under "Patient Instructions". Document created using STT-dictation technology, any transcriptional errors that may result from process are unintentional.    Contact & Pharmacy Preferred: [phone] Home: [phone] (home) Mobile: [phone] (mobile) E-mail: [email or web address]  [id number] [id number]: [phone]: [phone]  Pre-screening  Caleb [name] "in-person" vs "virtual" encounter. He indicated preferring virtual for this encounter.   Reason COVID-19*  Social distancing based on CDC and AMA recommendations.   I contacted Caleb [name] 06/13/2019 via telephone.      I clearly identified myself as FGaspar Cola MD. I verified that I was speaking with the correct person using two identifiers (Name: Caleb [name] and date of birth: 120-Apr-1957.  Consent I sought verbal advanced consent from Caleb Capronfor virtual visit interactions. I informed Caleb [name] possible security and privacy concerns, risks, and limitations associated with providing "not-in-person" medical evaluation and management services. I also informed Caleb [name] the availability of "in-person" appointments.  Finally, I informed him that there would be a charge for the virtual visit and that he could be  personally, fully or partially, financially responsible for it. Mr. HTimbermanexpressed understanding and agreed to proceed.   Historic Elements   Mr. Caleb KOSTICKis a 64y.o. year old, male patient evaluated today after his last contact with our practice on 05/23/2019. Caleb [name] has a past medical history of Chronic foot pain, right (2015), COPD (chronic obstructive pulmonary disease) ([place], Coronary artery disease, Emphysema lung ([place], Hyperlipidemia, Hypertension, Leucocytosis, Myocardial infarction ([place] (2015), and OSA on CPAP. He also  has a past surgical history that includes Cardiac catheterization (2015); Lumbar laminectomy (1989, 1999); Cervical fusion (1988, 1998); Liver surgery (2015); Partial colectomy (1990); Inguinal hernia repair (Bilateral, 1975); Lithotripsy; and Colonoscopy with propofol (N/A, 02/24/2017). Mr. HBentlerhas a current medication list which includes the following prescription(s): anoro ellipta, aspirin ec, cetirizine, fluticasone, hydrochlorothiazide, [START ON 06/17/2019] hydrocodone-acetaminophen, [START ON 07/17/2019] hydrocodone-acetaminophen, [START ON 08/16/2019] hydrocodone-acetaminophen, isosorbide mononitrate, losartan, methocarbamol, mometasone, naloxone, neomycin-polymyxin-hydrocortisone, nitroglycerin, nutritional supplements, pregabalin, rosuvastatin, secukinumab, and tadalafil. He  reports that he has been smoking cigarettes. He has a 38.25 pack-year smoking history. He has never used smokeless tobacco. He reports current alcohol use of about 4.0 standard drinks of alcohol per week. He reports that he does not use drugs. Mr. HVallecillois allergic to lisinopril and oxycodone.   HPI  Today, he is being contacted for worsening of previously known (established) problem.  The patient indicates that he is neck pain and shoulder pain is beginning to come back and he will like to  come in for a palliative cervical epidural steroid injection.  Today I took the time to review the cervical CT scan and the MRI that he had of the cervical spine.  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  Today we reviewed his symptoms and he indicated that primarily he is experiencing pain in the area of the neck and the shoulders as well as between the shoulder blades.  This could be secondary to a spinal or foraminal stenosis affecting the C4 and C5 nerve roots bilaterally.  However, the patient also has evidence of cervical facet disease while the stenosis that is documented for him seems to be worse from the C5-6 level down.  In addition, he denies any upper extremity pain or numbness except for when he first wakes up in the morning where he states that both of his hands are completely numb.  In terms of the distribution of this numbness he indicates that its affecting all the fingers, which would suggest problems from the C6 to the C8 level.  He does have central spinal stenosis and foraminal stenosis affecting those levels, but his primary area of pain seems to be the neck and the cervicogenic headaches, which would indicate and suggest that this may be coming from the facet joints as opposed to the intraspinal pathology.  The last time that he had a cervical epidural steroid injection once on 12/14/2018, which provided him with good relief of the pain until recently when it started coming back suggesting that he has been getting anywhere from 5 to 6 months of good relief from that procedure.  At this point, since the cervical epidural steroid injection is helping him with his symptoms, we will plan on repeating that one instead of thinking about cervical facet blocks.  Pertinent information about his cervical problems is the fact that he does have a history of having had to cervical discectomy and fusions done  more than 25 years ago.  He indicates that as long as he can, he rather treat this conservatively with epidural steroid injections rather than having to undergo any further cervical surgery.  Pharmacotherapy Assessment  Analgesic: Hydrocodone/APAP 5/325 1 tablet every 6 hours (20 mg/day of hydrocodone).  (Unable to tolerate an oxycodone IR trial due to stomachaches, nausea, headaches, and excessive somnolence.) MME/day: 20 mg/day.   Monitoring: [place] PMP: PDMP reviewed during this encounter.       Pharmacotherapy: No side-effects or adverse reactions reported. Compliance: No problems identified. Effectiveness: Clinically acceptable. Plan: Refer to "POC".  UDS:  Summary  Date Value Ref Range Status  12/21/2017 FINAL  Final    Comment:    ==================================================================== TOXASSURE SELECT 13 (MW) ==================================================================== Test                             Result       Flag       Units Drug Present and Declared for Prescription Verification   Oxycodone                      276          EXPECTED   ng/mg creat   Oxymorphone                    1086         EXPECTED   ng/mg creat   Noroxycodone                   1214         EXPECTED  ng/mg creat   Noroxymorphone                 397          EXPECTED   ng/mg creat    Sources of oxycodone are scheduled prescription medications.    Oxymorphone, noroxycodone, and noroxymorphone are expected    metabolites of oxycodone. Oxymorphone is also available as a    scheduled prescription medication. Drug Absent but Declared for Prescription Verification   Hydrocodone                    Not Detected UNEXPECTED ng/mg creat ==================================================================== Test                      Result    Flag   Units      Ref Range   Creatinine              96               mg/dL       >=20 ==================================================================== Declared Medications:  The flagging and interpretation on this report are based on the  following declared medications.  Unexpected results may arise from  inaccuracies in the declared medications.  **Note: The testing scope of this panel includes these medications:  Hydrocodone (Norco)  Oxycodone  **Note: The testing scope of this panel does not include following  reported medications:  Acetaminophen (Norco)  Albuterol  Aspirin (Aspirin 81)  Fluticasone (Flovent)  Hydrochlorothiazide (Hydrodiuril)  Isosorbide (Imdur)  Losartan (Cozaar)  Methocarbamol (Robaxin)  Metoprolol (Lopressor)  Nitroglycerin (Nitrostat)  Pregabalin (Lyrica)  Rosuvastatin (Crestor)  Secukinumab  Tiotropium (Spiriva)  Triamcinolone (Kenalog) ==================================================================== For clinical consultation, please call [phone]. ====================================================================     Laboratory Chemistry Profile   Renal Lab Results  Component Value Date   BUN 16 04/01/2019   CREATININE 0.75 04/01/2019   BCR 17 03/19/2019   GFRAA >60 04/01/2019   GFRNONAA >60 04/01/2019     Hepatic Lab Results  Component Value Date   AST 22 08/30/2018   ALT 23 08/30/2018   ALBUMIN 3.8 08/30/2018   ALKPHOS 71 08/30/2018     Electrolytes Lab Results  Component Value Date   NA 138 04/01/2019   K 3.9 04/01/2019   CL 98 04/01/2019   CALCIUM 9.4 04/01/2019   MG 2.0 05/30/2018     Bone Lab Results  Component Value Date   25OHVITD1 32 02/17/2017   25OHVITD2 <1.0 02/17/2017   25OHVITD3 32 02/17/2017   TESTOFREE 5.2 (L) 02/28/2017   TESTOSTERONE 279 02/28/2017     Inflammation (CRP: Acute Phase) (ESR: Chronic Phase) Lab Results  Component Value Date   CRP 8 11/07/2017   ESRSEDRATE 26 11/07/2017       Note: Above Lab results reviewed.   Imaging  DG PAIN CLINIC C-ARM  1-60 MIN NO REPORT Fluoro was used, but no Radiologist interpretation will be provided.  Please refer to "NOTES" tab for provider progress note.  Assessment  The primary encounter diagnosis was Chronic pain syndrome. 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  Plan of Care  Problem-specific:  No problem-specific  Assessment & Plan notes found for this encounter.  Caleb [name] has a current medication list which includes the following long-term medication(s): cetirizine, [START ON 06/17/2019] hydrocodone-acetaminophen, [START ON 07/17/2019] hydrocodone-acetaminophen, [START ON 08/16/2019] hydrocodone-acetaminophen, isosorbide mononitrate, losartan, methocarbamol, nitroglycerin, pregabalin, rosuvastatin, and tadalafil.  Pharmacotherapy (Medications Ordered): No orders of the defined types were placed in this encounter.  Orders:  Orders Placed This Encounter  Procedures  . Cervical Epidural Injection    Level(s): C7-T1 Laterality: Left-sided Purpose: Palliative Indication(s): Radiculitis and cervicalgia associater with cervical degenerative disc disease.    Standing Status:   Future    Standing Expiration Date:   07/13/2019    Scheduling Instructions:     Procedure: Cervical Epidural Steroid Injection/Block     Sedation: With Sedation.     Timeframe: As soon as schedule allows    Order Specific Question:   Where will this procedure be performed?    Answer:   [facility]    Comments:   by Dr. Dossie Arbour   Follow-up plan:   Return for Procedure (w/ sedation): (L) CESI, (ASAP).       Interventional management options: Considering:   Possible spinal cord stimulator trial  Possible bilateral cervical facet RFA Diagnostic right ankle block Diagnosticright lumbar sympathetic block Diagnostic midlineTESI Diagnostic bilateral thoracic facet block Possible bilateral thoracic facet RFA   Palliative PRN treatment(s):   Palliative left CESI #4 Diagnostic bilateral cervical facet block#3 Palliative/diagnostic right-sidedCommon Peroneal NB#3 Therapeutic right-sided deep peroneal nerve block #2  Palliative right-sided common peroneal nerve RFA #2(last done on 06/21/2017)     Recent Visits Date Type Provider Dept  05/03/19 Procedure visit Milinda Pointer, MD [facility]  04/12/19 Telemedicine Milinda Pointer, [facility]  03/21/19 Telemedicine Milinda Pointer, MD [facility]  Showing recent visits within past 90 days and meeting all other requirements   Today's Visits Date Type Provider Dept  06/13/19 Telemedicine Milinda Pointer, MD [facility]  Showing today's visits and meeting all other requirements   Future Appointments No visits were found meeting these conditions.  Showing future appointments within next 90 days and meeting all other requirements   I discussed the assessment and treatment plan with the patient. The patient was provided an opportunity to ask questions and all were answered. The patient agreed with the plan and demonstrated an understanding of the instructions.  Patient advised to call back or seek an in-person evaluation if the symptoms or condition worsens.  Duration of encounter: 25 minutes.  Note by: Gaspar Cola, MD Date: 06/13/2019; Time: 3:44 PM

## 2019-06-13 ENCOUNTER — Telehealth: Payer: Medicaid Other | Admitting: Pain Medicine

## 2019-06-13 ENCOUNTER — Other Ambulatory Visit: Payer: Self-pay

## 2019-06-13 ENCOUNTER — Ambulatory Visit: Payer: Medicaid Other | Attending: Pain Medicine | Admitting: Pain Medicine

## 2019-06-13 DIAGNOSIS — M4722 Other spondylosis with radiculopathy, cervical region: Secondary | ICD-10-CM | POA: Diagnosis not present

## 2019-06-13 DIAGNOSIS — M4802 Spinal stenosis, cervical region: Secondary | ICD-10-CM | POA: Diagnosis not present

## 2019-06-13 DIAGNOSIS — G8929 Other chronic pain: Secondary | ICD-10-CM

## 2019-06-13 DIAGNOSIS — M542 Cervicalgia: Secondary | ICD-10-CM

## 2019-06-13 DIAGNOSIS — M5010 Cervical disc disorder with radiculopathy, unspecified cervical region: Secondary | ICD-10-CM | POA: Diagnosis not present

## 2019-06-13 DIAGNOSIS — M47812 Spondylosis without myelopathy or radiculopathy, cervical region: Secondary | ICD-10-CM | POA: Diagnosis not present

## 2019-06-13 DIAGNOSIS — M25571 Pain in right ankle and joints of right foot: Secondary | ICD-10-CM | POA: Diagnosis not present

## 2019-06-13 DIAGNOSIS — M79671 Pain in right foot: Secondary | ICD-10-CM | POA: Diagnosis not present

## 2019-06-13 DIAGNOSIS — M546 Pain in thoracic spine: Secondary | ICD-10-CM | POA: Diagnosis not present

## 2019-06-13 DIAGNOSIS — G894 Chronic pain syndrome: Secondary | ICD-10-CM | POA: Diagnosis not present

## 2019-06-13 NOTE — Patient Instructions (Signed)
____________________________________________________________________________________________  Preparing for Procedure with Sedation  Procedure appointments are limited to planned procedures: . No Prescription Refills. . No disability issues will be discussed. . No medication changes will be discussed.  Instructions: . Oral Intake: Do not eat or drink anything for at least 8 hours prior to your procedure. (Exception: Blood Pressure Medication. See below.) . Transportation: Unless otherwise stated by your physician, you may drive yourself after the procedure. . Blood Pressure Medicine: Do not forget to take your blood pressure medicine with a sip of water the morning of the procedure. If your Diastolic (lower reading)is above 100 mmHg, elective cases will be cancelled/rescheduled. . Blood thinners: These will need to be stopped for procedures. Notify our staff if you are taking any blood thinners. Depending on which one you take, there will be specific instructions on how and when to stop it. . Diabetics on insulin: Notify the staff so that you can be scheduled 1st case in the morning. If your diabetes requires high dose insulin, take only  of your normal insulin dose the morning of the procedure and notify the staff that you have done so. . Preventing infections: Shower with an antibacterial soap the morning of your procedure. . Build-up your immune system: Take 1000 mg of Vitamin C with every meal (3 times a day) the day prior to your procedure. . Antibiotics: Inform the staff if you have a condition or reason that requires you to take antibiotics before dental procedures. . Pregnancy: If you are pregnant, call and cancel the procedure. . Sickness: If you have a cold, fever, or any active infections, call and cancel the procedure. . Arrival: You must be in the facility at least 30 minutes prior to your scheduled procedure. . Children: Do not bring children with you. . Dress appropriately:  Bring dark clothing that you would not mind if they get stained. . Valuables: Do not bring any jewelry or valuables.  Reasons to call and reschedule or cancel your procedure: (Following these recommendations will minimize the risk of a serious complication.) . Surgeries: Avoid having procedures within 2 weeks of any surgery. (Avoid for 2 weeks before or after any surgery). . Flu Shots: Avoid having procedures within 2 weeks of a flu shots or . (Avoid for 2 weeks before or after immunizations). . Barium: Avoid having a procedure within 7-10 days after having had a radiological study involving the use of radiological contrast. (Myelograms, Barium swallow or enema study). . Heart attacks: Avoid any elective procedures or surgeries for the initial 6 months after a "Myocardial Infarction" (Heart Attack). . Blood thinners: It is imperative that you stop these medications before procedures. Let us know if you if you take any blood thinner.  . Infection: Avoid procedures during or within two weeks of an infection (including chest colds or gastrointestinal problems). Symptoms associated with infections include: Localized redness, fever, chills, night sweats or profuse sweating, burning sensation when voiding, cough, congestion, stuffiness, runny nose, sore throat, diarrhea, nausea, vomiting, cold or Flu symptoms, recent or current infections. It is specially important if the infection is over the area that we intend to treat. . Heart and lung problems: Symptoms that may suggest an active cardiopulmonary problem include: cough, chest pain, breathing difficulties or shortness of breath, dizziness, ankle swelling, uncontrolled high or unusually low blood pressure, and/or palpitations. If you are experiencing any of these symptoms, cancel your procedure and contact your primary care physician for an evaluation.  Remember:  Regular Business hours are:    Monday to Thursday 8:00 AM to 4:00 PM  Provider's  Schedule: Ivianna Notch, MD:  Procedure days: Tuesday and Thursday 7:30 AM to 4:00 PM  Bilal Lateef, MD:  Procedure days: Monday and Wednesday 7:30 AM to 4:00 PM ____________________________________________________________________________________________    

## 2019-06-19 ENCOUNTER — Ambulatory Visit
Admission: RE | Admit: 2019-06-19 | Discharge: 2019-06-19 | Disposition: A | Discharge status: Home / Self Care | Payer: Medicaid Other | Source: Ambulatory Visit | Attending: Pain Medicine | Admitting: Pain Medicine

## 2019-06-19 ENCOUNTER — Other Ambulatory Visit: Payer: Self-pay

## 2019-06-19 ENCOUNTER — Ambulatory Visit (HOSPITAL_BASED_OUTPATIENT_CLINIC_OR_DEPARTMENT_OTHER): Payer: Medicaid Other | Admitting: Pain Medicine

## 2019-06-19 ENCOUNTER — Ambulatory Visit (INDEPENDENT_AMBULATORY_CARE_PROVIDER_SITE_OTHER): Payer: Medicaid Other | Admitting: Family Medicine

## 2019-06-19 ENCOUNTER — Encounter: Payer: Self-pay | Admitting: Pain Medicine

## 2019-06-19 ENCOUNTER — Encounter: Payer: Self-pay | Admitting: Family Medicine

## 2019-06-19 VITALS — BP 126/84 | HR 78 | Temp 97.7°F | Resp 16 | Wt 200.4 lb

## 2019-06-19 VITALS — BP 152/83 | HR 81 | Temp 98.0°F | Resp 18 | Ht 68.0 in | Wt 200.0 lb

## 2019-06-19 DIAGNOSIS — Z885 Allergy status to narcotic agent status: Secondary | ICD-10-CM | POA: Insufficient documentation

## 2019-06-19 DIAGNOSIS — G894 Chronic pain syndrome: Secondary | ICD-10-CM

## 2019-06-19 DIAGNOSIS — M545 Low back pain, unspecified: Secondary | ICD-10-CM

## 2019-06-19 DIAGNOSIS — Z888 Allergy status to other drugs, medicaments and biological substances status: Secondary | ICD-10-CM | POA: Insufficient documentation

## 2019-06-19 DIAGNOSIS — M9904 Segmental and somatic dysfunction of sacral region: Secondary | ICD-10-CM | POA: Insufficient documentation

## 2019-06-19 DIAGNOSIS — M533 Sacrococcygeal disorders, not elsewhere classified: Secondary | ICD-10-CM | POA: Insufficient documentation

## 2019-06-19 DIAGNOSIS — M4722 Other spondylosis with radiculopathy, cervical region: Secondary | ICD-10-CM

## 2019-06-19 DIAGNOSIS — G8929 Other chronic pain: Secondary | ICD-10-CM | POA: Insufficient documentation

## 2019-06-19 MED ORDER — METHYLPREDNISOLONE ACETATE 80 MG/ML IJ SUSP
80.0000 mg | Freq: Once | INTRAMUSCULAR | Status: AC
  Administered 2019-06-19: 80 mg via INTRA_ARTICULAR

## 2019-06-19 MED ORDER — FENTANYL CITRATE (PF) 100 MCG/2ML IJ SOLN
25.0000 ug | INTRAMUSCULAR | Status: DC | PRN
  Administered 2019-06-19: 50 ug via INTRAVENOUS

## 2019-06-19 MED ORDER — LIDOCAINE HCL 2 % IJ SOLN
20.0000 mL | Freq: Once | INTRAMUSCULAR | Status: AC
  Administered 2019-06-19: 400 mg

## 2019-06-19 MED ORDER — LIDOCAINE HCL 2 % IJ SOLN
INTRAMUSCULAR | Status: AC
  Filled 2019-06-19: qty 20

## 2019-06-19 MED ORDER — FENTANYL CITRATE (PF) 100 MCG/2ML IJ SOLN
INTRAMUSCULAR | Status: AC
  Filled 2019-06-19: qty 2

## 2019-06-19 MED ORDER — ROPIVACAINE HCL 2 MG/ML IJ SOLN
9.0000 mL | Freq: Once | INTRAMUSCULAR | Status: AC
  Administered 2019-06-19: 9 mL via INTRA_ARTICULAR

## 2019-06-19 MED ORDER — MIDAZOLAM HCL 5 MG/5ML IJ SOLN
1.0000 mg | INTRAMUSCULAR | Status: DC | PRN
  Administered 2019-06-19: 2 mg via INTRAVENOUS

## 2019-06-19 MED ORDER — ROPIVACAINE HCL 2 MG/ML IJ SOLN
INTRAMUSCULAR | Status: AC
  Filled 2019-06-19: qty 10

## 2019-06-19 MED ORDER — MIDAZOLAM HCL 5 MG/5ML IJ SOLN
INTRAMUSCULAR | Status: AC
  Filled 2019-06-19: qty 5

## 2019-06-19 MED ORDER — METHYLPREDNISOLONE ACETATE 80 MG/ML IJ SUSP
INTRAMUSCULAR | Status: AC
  Filled 2019-06-19: qty 1

## 2019-06-19 MED ORDER — LACTATED RINGERS IV SOLN: 1000.0000 mL | Freq: Once | INTRAVENOUS | Status: DC

## 2019-06-19 NOTE — Progress Notes (Signed)
PROVIDER NOTE: Information contained herein reflects review and annotations entered in association with encounter. Interpretation of such information and data should be left to medically-trained personnel. Information provided to patient can be located elsewhere in the medical record under "Patient Instructions". Document created using STT-dictation technology, any transcriptional errors that may result from process are unintentional.    Patient: Caleb [name]  Service Category: Procedure  Provider: Gaspar Cola, MD  DOB: December 10, 1955  DOS: 06/19/2019  Location: [facility]  MRN: [id number]  Setting: Ambulatory - outpatient  Referring Provider: Virginia Crews, MD  Type: Established Patient  Specialty: Interventional Pain Management  PCP: Virginia Crews, MD   Primary Reason for Visit: Interventional Pain Management Treatment. CC: Back Pain (lumbar left side ) and Neck Pain (midline)  Procedure:          Anesthesia, Analgesia, Anxiolysis:  Type: Diagnostic Sacroiliac Joint Steroid Injection #1  Region: Superior Lumbosacral Region Level: PSIS (Posterior Superior Iliac Spine) Laterality: Left  Type: Moderate (Conscious) Sedation combined with Local Anesthesia Indication(s): Analgesia and Anxiety Route: Intravenous (IV) IV Access: Secured Sedation: Meaningful verbal contact was maintained at all times during the procedure  Local Anesthetic: Lidocaine 1-2%  Position: Prone           Indications: 1. Sacroiliac joint pain (Left)   2. Sacroiliac joint dysfunction (Left)   3. Somatic dysfunction of sacroiliac joint (Left)   4. Acute low back pain (Left) w/o sciatica    Pain Score: Pre-procedure: 8 /10 Post-procedure: 0-No pain/10   Note: The patient came into the clinic today for a scheduled left-sided cervical epidural steroid injection under fluoroscopic guidance and IV sedation.  However, he describes that this past weekend he was assisting some friends in  moving some furniture, and although he did not lift any furniture himself, he did try for approximately 5 hours for them.  This trigger an acute low back pain in the left lower PSIS region.  Physical exam today was positive for sacroiliac joint pain on provocative Patrick maneuver and figure 4 maneuver.  These 2 exactly reproduced the patient's pain.  He describes the pain as being located around the PSIS area with no radiation down the lower extremities or the upper back.  The pain was also not aggravated with hyperextension and rotation.  He also describes difficulty walking with referral of some of the pain towards the left groin area.  In view of this, we have decided to change our plan and do a diagnostic/therapeutic left intra-articular sacroiliac joint injection under fluoroscopic guidance.  The patient has also requested IV sedation.  Pre-op Assessment:  Caleb [name] is a 64 y.o. (year old), male patient, seen today for interventional treatment. He  has a past surgical history that includes Cardiac catheterization (2015); Lumbar laminectomy (1989, 1999); Cervical fusion (1988, 1998); Liver surgery (2015); Partial colectomy (1990); Inguinal hernia repair (Bilateral, 1975); Lithotripsy; and Colonoscopy with propofol (N/A, 02/24/2017). Caleb [name] has a current medication list which includes the following prescription(s): anoro ellipta, aspirin ec, cetirizine, fluticasone, hydrochlorothiazide, hydrocodone-acetaminophen, [START ON 07/17/2019] hydrocodone-acetaminophen, [START ON 08/16/2019] hydrocodone-acetaminophen, isosorbide mononitrate, losartan, methocarbamol, mometasone, naloxone, neomycin-polymyxin-hydrocortisone, nitroglycerin, nutritional supplements, pregabalin, rosuvastatin, tadalafil, and secukinumab, and the following Facility-Administered Medications: fentanyl, lactated ringers, and midazolam. His primarily concern today is the Back Pain (lumbar left side ) and Neck Pain (midline)  Initial Vital  Signs:  Pulse/HCG Rate: 81ECG Heart Rate: 76 Temp: 99.5 F (37.5 C) Resp: 16 BP: (!) 144/84 SpO2: 100 %  BMI:  Estimated body mass index is 30.41 kg/m as calculated from the following:   Height as of this encounter: 5\' 8"  (1.727 m).   Weight as of this encounter: 200 lb (90.7 kg).  Risk Assessment: Allergies: Reviewed. He is allergic to lisinopril and oxycodone.  Allergy Precautions: None required Coagulopathies: Reviewed. None identified.  Blood-thinner therapy: None at this time Active Infection(s): Reviewed. None identified. Caleb [name] is afebrile  Site Confirmation: Caleb [name] was asked to confirm the procedure and laterality before marking the site Procedure checklist: Completed Consent: Before the procedure and under the influence of no sedative(s), amnesic(s), or anxiolytics, the patient was informed of the treatment options, risks and possible complications. To fulfill our ethical and legal obligations, as recommended by the American Medical Association's Code of Ethics, I have informed the patient of my clinical impression; the nature and purpose of the treatment or procedure; the risks, benefits, and possible complications of the intervention; the alternatives, including doing nothing; the risk(s) and benefit(s) of the alternative treatment(s) or procedure(s); and the risk(s) and benefit(s) of doing nothing. The patient was provided information about the general risks and possible complications associated with the procedure. These may include, but are not limited to: failure to achieve desired goals, infection, bleeding, organ or nerve damage, allergic reactions, paralysis, and death. In addition, the patient was informed of those risks and complications associated to the procedure, such as failure to decrease pain; infection; bleeding; organ or nerve damage with subsequent damage to sensory, motor, and/or autonomic systems, resulting in permanent pain, numbness, and/or weakness of  one or several areas of the body; allergic reactions; (i.e.: anaphylactic reaction); and/or death. Furthermore, the patient was informed of those risks and complications associated with the medications. These include, but are not limited to: allergic reactions (i.e.: anaphylactic or anaphylactoid reaction(s)); adrenal axis suppression; blood sugar elevation that in diabetics may result in ketoacidosis or comma; water retention that in patients with history of congestive heart failure may result in shortness of breath, pulmonary edema, and decompensation with resultant heart failure; weight gain; swelling or edema; medication-induced neural toxicity; particulate matter embolism and blood vessel occlusion with resultant organ, and/or nervous system infarction; and/or aseptic necrosis of one or more joints. Finally, the patient was informed that Medicine is not an exact science; therefore, there is also the possibility of unforeseen or unpredictable risks and/or possible complications that may result in a catastrophic outcome. The patient indicated having understood very clearly. We have given the patient no guarantees and we have made no promises. Enough time was given to the patient to ask questions, all of which were answered to the patient's satisfaction. Mr. Weatherly has indicated that he wanted to continue with the procedure. Attestation: I, the ordering provider, attest that I have discussed with the patient the benefits, risks, side-effects, alternatives, likelihood of achieving goals, and potential problems during recovery for the procedure that I have provided informed consent. Date  Time: 06/19/2019 12:45 PM  Pre-Procedure Preparation:  Monitoring: As per clinic protocol. Respiration, ETCO2, SpO2, BP, heart rate and rhythm monitor placed and checked for adequate function Safety Precautions: Patient was assessed for positional comfort and pressure points before starting the procedure. Time-out: I  initiated and conducted the "Time-out" before starting the procedure, as per protocol. The patient was asked to participate by confirming the accuracy of the "Time Out" information. Verification of the correct person, site, and procedure were performed and confirmed by me, the nursing staff, and the patient. "Time-out" conducted as per  Joint Commission's Universal Protocol (UP.01.01.01). Time: 1318  Description of Procedure:          Target Area: Superior, posterior, aspect of the sacroiliac fissure Approach: Posterior, paraspinal, ipsilateral approach. Area Prepped: Entire Lower Lumbosacral Region DuraPrep (Iodine Povacrylex [0.7% available iodine] and Isopropyl Alcohol, 74% w/w) Safety Precautions: Aspiration looking for blood return was conducted prior to all injections. At no point did we inject any substances, as a needle was being advanced. No attempts were made at seeking any paresthesias. Safe injection practices and needle disposal techniques used. Medications properly checked for expiration dates. SDV (single dose vial) medications used. Description of the Procedure: Protocol guidelines were followed. The patient was placed in position over the procedure table. The target area was identified and the area prepped in the usual manner. Skin & deeper tissues infiltrated with local anesthetic. Appropriate amount of time allowed to pass for local anesthetics to take effect. The procedure needle was advanced under fluoroscopic guidance into the sacroiliac joint until a firm endpoint was obtained. Proper needle placement secured. Negative aspiration confirmed. Solution injected in intermittent fashion, asking for systemic symptoms every 0.5cc of injectate. The needles were then removed and the area cleansed, making sure to leave some of the prepping solution back to take advantage of its long term bactericidal properties. Vitals:   06/19/19 1325 06/19/19 1334 06/19/19 1344 06/19/19 1355  BP: (!)  129/101 128/72 (!) 155/73 (!) 152/83  Pulse:      Resp: 18 18 18 18   Temp:  98.1 F (36.7 C)  98 F (36.7 C)  TempSrc:      SpO2: 93% 93% 95% 96%  Weight:      Height:        Start Time: 1318 hrs. End Time: 1325 hrs. Materials:  Needle(s) Type: Spinal Needle Gauge: 22G Length: 3.5-in Medication(s): Please see orders for medications and dosing details.  Imaging Guidance (Non-Spinal):          Type of Imaging Technique: Fluoroscopy Guidance (Non-Spinal) Indication(s): Assistance in needle guidance and placement for procedures requiring needle placement in or near specific anatomical locations not easily accessible without such assistance. Exposure Time: Please see nurses notes. Contrast: Before injecting any contrast, we confirmed that the patient did not have an allergy to iodine, shellfish, or radiological contrast. Once satisfactory needle placement was completed at the desired level, radiological contrast was injected. Contrast injected under live fluoroscopy. No contrast complications. See chart for type and volume of contrast used. Fluoroscopic Guidance: I was personally present during the use of fluoroscopy. "Tunnel Vision Technique" used to obtain the best possible view of the target area. Parallax error corrected before commencing the procedure. "Direction-depth-direction" technique used to introduce the needle under continuous pulsed fluoroscopy. Once target was reached, antero-posterior, oblique, and lateral fluoroscopic projection used confirm needle placement in all planes. Images permanently stored in EMR. Interpretation: I personally interpreted the imaging intraoperatively. Adequate needle placement confirmed in multiple planes. Appropriate spread of contrast into desired area was observed. No evidence of afferent or efferent intravascular uptake. Permanent images saved into the patient's record.  Antibiotic Prophylaxis:   Anti-infectives (From admission, onward)   None      Indication(s): None identified  Post-operative Assessment:  Post-procedure Vital Signs:  Pulse/HCG Rate: 8173 Temp: 98 F (36.7 C) Resp: 18 BP: (!) 152/83 SpO2: 96 %  EBL: None  Complications: No immediate post-treatment complications observed by team, or reported by patient.  Note: The patient tolerated the entire procedure well. A repeat set  of vitals were taken after the procedure and the patient was kept under observation following institutional policy, for this type of procedure. Post-procedural neurological assessment was performed, showing return to baseline, prior to discharge. The patient was provided with post-procedure discharge instructions, including a section on how to identify potential problems. Should any problems arise concerning this procedure, the patient was given instructions to immediately contact us, at any time, without hesitation. In any case, we plan to contact the patient by telephone for a follow-up status report regarding this interventional procedure.  Comments:  No additional relevant information.  Plan of Care  Orders:  Orders Placed This Encounter  Procedures  . SACROILIAC JOINT INJECTION    Scheduling Instructions:     Side: Left-sided     Sedation: With Sedation.     Timeframe: Today    Order Specific Question:   Where will this procedure be performed?    Answer:   [facility]  . Cervical Epidural Injection    Level(s): C7-T1 Laterality: Left-sided Purpose: Palliative Indication(s): Radiculitis and cervicalgia associater with cervical degenerative disc disease.    Standing Status:   Future    Standing Expiration Date:   07/19/2019    Scheduling Instructions:     Procedure: Cervical Epidural Steroid Injection/Block     Sedation: With Sedation.     Timeframe: in 2 weeks    Order Specific Question:   Where will this procedure be performed?    Answer:   [facility]    Comments:   by Dr. Dossie Arbour  . [facility] C-[facility]  1-60 MIN NO REPORT    Intraoperative interpretation by procedural physician at [place].    Standing Status:   Standing    Number of Occurrences:   1    Order Specific Question:   Reason for exam:    Answer:   Assistance in needle guidance and placement for procedures requiring needle placement in or near specific anatomical locations not easily accessible without such assistance.  . Informed Consent Details: Physician/Practitioner Attestation; Transcribe to consent form and obtain patient signature    Provider Attestation: I, [place] Dossie Arbour, MD, (Pain Management Specialist), the physician/practitioner, attest that I have discussed with the patient the benefits, risks, side effects, alternatives, likelihood of achieving goals and potential problems during recovery for the procedure that I have provided informed consent.    Scheduling Instructions:     Procedure: Sacroiliac Joint Block     Indication/Reason: Chronic Low Back and Hip Pain secondary to Sacroiliac Joint Pain (Arthralgia/Arthropathy)     Nursing Order: Transcribe to consent form and obtain patient signature.     Note: Always confirm laterality of pain with Mr. Main, before procedure.  . Provide equipment / supplies at bedside    Equipment required: Single use, disposable, "Block Tray"    Standing Status:   Standing    Number of Occurrences:   1    Order Specific Question:   Specify    Answer:   Block Tray   Chronic Opioid Analgesic:  Hydrocodone/APAP 5/325 1 tablet every 6 hours (20 mg/day of hydrocodone).  (Unable to tolerate an oxycodone IR trial due to stomachaches, nausea, headaches, and excessive somnolence.) MME/day: 20 mg/day.   Medications ordered for procedure: Meds ordered this encounter  Medications  . lidocaine (XYLOCAINE) 2 % (with pres) injection 400 mg  . lactated ringers infusion 1,000 mL  . midazolam (VERSED) 5 MG/5ML injection 1-2 mg    Make sure Flumazenil  is available in the pyxis  when using this medication. If oversedation occurs, administer 0.2 mg IV over 15 sec. If after 45 sec no response, administer 0.2 mg again over 1 min; may repeat at 1 min intervals; not to exceed 4 doses (1 mg)  . fentaNYL (SUBLIMAZE) injection 25-50 mcg    Make sure Narcan is available in the pyxis when using this medication. In the event of respiratory depression (RR< 8/min): Titrate NARCAN (naloxone) in increments of 0.1 to 0.2 mg IV at 2-3 minute intervals, until desired degree of reversal.  . methylPREDNISolone acetate (DEPO-MEDROL) injection 80 mg  . ropivacaine (PF) 2 mg/mL (0.2%) (NAROPIN) injection 9 mL   Medications administered: We administered lidocaine, midazolam, fentaNYL, methylPREDNISolone acetate, and ropivacaine (PF) 2 mg/mL (0.2%).  See the medical record for exact dosing, route, and time of administration.  Follow-up plan:   Return in about 2 weeks (around 07/03/2019) for Procedure (w/ sedation): (L) CESI.       Interventional management options: Considering:   Possible spinal cord stimulator trial  Possible bilateral cervical facet RFA Diagnostic right ankle block Diagnosticright lumbar sympathetic block Diagnostic midlineTESI Diagnostic bilateral thoracic facet block Possible bilateral thoracic facet RFA   Palliative PRN treatment(s):   Palliative left CESI #4 Diagnostic bilateral cervical facet block#3 Palliative/diagnostic right-sidedCommon Peroneal NB#3 Therapeutic right-sided deep peroneal nerve block #2  Palliative right-sided common peroneal nerve RFA #2(last done on 06/21/2017)      Recent Visits Date Type Provider Dept  06/13/19 Telemedicine Milinda Pointer, MD [facility]  05/03/19 Procedure visit Milinda Pointer, MD [facility]  04/12/19 Telemedicine Milinda Pointer, [facility]  03/21/19 Telemedicine Milinda Pointer, MD [facility]  Showing recent visits within past 90 days and  meeting all other requirements Today's Visits Date Type Provider Dept  06/19/19 Procedure visit Milinda Pointer, MD [facility]  Showing today's visits and meeting all other requirements Future Appointments Date Type Provider Dept  07/03/19 Appointment Milinda Pointer, MD [facility]  09/12/19 Appointment Milinda Pointer, MD [facility]  Showing future appointments within next 90 days and meeting all other requirements  Disposition: Discharge home  Discharge (Date  Time): 06/19/2019; 1400 hrs.   Primary Care Physician: Virginia Crews, MD Location: [facility] Note by: Gaspar Cola, MD Date: 06/19/2019; Time: 2:57 PM  Disclaimer:  Medicine is not an Chief Strategy Officer. The only guarantee in medicine is that nothing is guaranteed. It is important to note that the decision to proceed with this intervention was based on the information collected from the patient. The Data and conclusions were drawn from the patient's questionnaire, the interview, and the physical examination. Because the information was provided in large part by the patient, it cannot be guaranteed that it has not been purposely or unconsciously manipulated. Every effort has been made to obtain as much relevant data as possible for this evaluation. It is important to note that the conclusions that lead to this procedure are derived in large part from the available data. Always take into account that the treatment will also be dependent on availability of resources and existing treatment guidelines, considered by other Pain Management Practitioners as being common knowledge and practice, at the time of the intervention. For Medico-Legal purposes, it is also important to point out that variation in procedural techniques and pharmacological choices are the acceptable norm. The indications, contraindications, technique, and results of the above procedure should  only be interpreted and  judged by a Board-Certified Interventional Pain Specialist with extensive familiarity and expertise in the same exact procedure and technique.
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
## 2019-06-19 NOTE — Patient Instructions (Addendum)

## 2019-06-20 ENCOUNTER — Telehealth: Payer: Self-pay | Admitting: *Deleted

## 2019-06-20 NOTE — Telephone Encounter (Signed)
No problems post procedure. 

## 2019-06-21 ENCOUNTER — Telehealth: Payer: Self-pay

## 2019-06-21 NOTE — Telephone Encounter (Signed)
Copied from [place] [id number]. Topic: General - Inquiry >> Jun 21, 2019  2:20 PM Caleb [name], [place] wrote: Reason for CRM: Patient called in stating he was summoned to [facility] on June 30th and he is needing a letter excusing him due to his extreme pain in back. Patient did go to pain clinic and did get some slight relief but still cannot sit long. Please advise as patient would like to know when it is ready for pick up.

## 2019-06-25 DIAGNOSIS — J342 Deviated nasal septum: Secondary | ICD-10-CM | POA: Diagnosis not present

## 2019-06-25 DIAGNOSIS — H6502 Acute serous otitis media, left ear: Secondary | ICD-10-CM | POA: Diagnosis not present

## 2019-06-25 DIAGNOSIS — J301 Allergic rhinitis due to pollen: Secondary | ICD-10-CM | POA: Diagnosis not present

## 2019-06-25 NOTE — Telephone Encounter (Signed)
Will need his Juror number

## 2019-06-26 NOTE — Telephone Encounter (Signed)
Patient will call back to give [place] his juror number.

## 2019-06-27 ENCOUNTER — Telehealth: Payer: Self-pay | Admitting: Acute Care

## 2019-06-27 DIAGNOSIS — G4733 Obstructive sleep apnea (adult) (pediatric): Secondary | ICD-10-CM

## 2019-06-27 NOTE — Telephone Encounter (Signed)
Called and spoke to pt.  Pt stated that his bipap is drying him out. He does not feel that he is getting enough moisture.  He stated that he feels that the machine is working well for him, he wakes feeling well rested but he is unable to stay asleep due to being dried out.   Sarah, please advise. Thanks

## 2019-06-28 ENCOUNTER — Ambulatory Visit: Payer: Medicaid Other | Attending: Pain Medicine | Admitting: Pain Medicine

## 2019-06-28 ENCOUNTER — Other Ambulatory Visit: Payer: Self-pay

## 2019-06-28 ENCOUNTER — Telehealth: Payer: Self-pay

## 2019-06-28 ENCOUNTER — Encounter: Payer: Self-pay | Admitting: Pain Medicine

## 2019-06-28 DIAGNOSIS — Z9889 Other specified postprocedural states: Secondary | ICD-10-CM

## 2019-06-28 DIAGNOSIS — M542 Cervicalgia: Secondary | ICD-10-CM

## 2019-06-28 DIAGNOSIS — M961 Postlaminectomy syndrome, not elsewhere classified: Secondary | ICD-10-CM

## 2019-06-28 DIAGNOSIS — M545 Low back pain, unspecified: Secondary | ICD-10-CM

## 2019-06-28 DIAGNOSIS — M5137 Other intervertebral disc degeneration, lumbosacral region: Secondary | ICD-10-CM | POA: Diagnosis not present

## 2019-06-28 DIAGNOSIS — G8928 Other chronic postprocedural pain: Secondary | ICD-10-CM | POA: Diagnosis not present

## 2019-06-28 DIAGNOSIS — M5136 Other intervertebral disc degeneration, lumbar region: Secondary | ICD-10-CM

## 2019-06-28 MED ORDER — PREDNISONE 20 MG PO TABS: ORAL_TABLET | ORAL | 0 refills | Status: AC

## 2019-06-28 NOTE — Addendum Note (Signed)
Addended by: Claudette Head A on: 06/28/2019 11:17 AM   Modules accepted: Orders

## 2019-06-28 NOTE — Telephone Encounter (Signed)
He said he is in a lot of pain right now. He said his case worker told him to call us right away. I reminded him that he has a procedure Tuesday of next week but he wants to have his meds adjusted or something. He wants a nurse to call him back.

## 2019-06-28 NOTE — Telephone Encounter (Signed)
Patient called today c/o lower back pain along with pain in neck.  States last procedure on lumbar area did help and lasted approx 3 days but since last evening he is in excrcuciating pain needs to have his medications adjusted.  C/o weakness in legs and a lot of difficulty walking.  I told him that I would need to check and see if we could get him in today for a VV with Dr Dossie Arbour.  States he talked with his case worker earlier and they wanted him to call and try and be seen rather than go to the ED.

## 2019-06-28 NOTE — Progress Notes (Signed)
Patient: [name]  Service Category: E/M  Provider: Gaspar Cola, MD  DOB: 01/13/1955  DOS: 06/28/2019  Location: Office  MRN: [id number]  Setting: Ambulatory outpatient  Referring Provider: Virginia Crews, MD  Type: Established Patient  Specialty: Interventional Pain Management  PCP: Virginia Crews, MD  Location: Remote location  Delivery: TeleHealth     Virtual Encounter - Pain Management PROVIDER NOTE: Information contained herein reflects review and annotations entered in association with encounter. Interpretation of such information and data should be left to medically-trained personnel. Information provided to patient can be located elsewhere in the medical record under "Patient Instructions". Document created using STT-dictation technology, any transcriptional errors that may result from process are unintentional.    Contact & Pharmacy Preferred: [phone] Home: [phone] (home) Mobile: [phone] (mobile) E-mail: [email or web address]  [id number] [id number]: [phone]: [phone]  Pre-screening  Mr. HDommeroffered "in-person" vs "virtual" encounter. He indicated preferring virtual for this encounter.   Reason COVID-19*  Social distancing based on CDC and AMA recommendations.   I contacted RMerian Capronon 06/28/2019 via telephone.      I clearly identified myself as FGaspar Cola MD. I verified that I was speaking with the correct person using two identifiers (Name: RCILLIAN GWINNER and date of birth: [birth year].  Consent I sought verbal advanced consent from [name] virtual visit interactions. I informed Mr. HFennellof possible security and privacy concerns, risks, and limitations associated with providing "not-in-person" medical evaluation and management services. I also informed Mr. HLanzoof the availability of "in-person"  appointments. Finally, I informed him that there would be a charge for the virtual visit and that he could be  personally, fully or partially, financially responsible for it. Mr. HAnsellexpressed understanding and agreed to proceed.   Historic Elements   Mr. RFLYNN GWYNis a 64y.o. year old, male patient evaluated today after his last contact with our practice on 06/28/2019. Mr. HLimones has a past medical history of Chronic foot pain, right (2015), COPD (chronic obstructive pulmonary disease) ([place], Coronary artery disease, Emphysema lung ([place], Hyperlipidemia, Hypertension, Leucocytosis, Myocardial infarction ([place] (2015), and OSA on CPAP. He also  has a past surgical history that includes Cardiac catheterization (2015); Lumbar laminectomy (1989, 1999); Cervical fusion (1988, 1998); Liver surgery (2015); Partial colectomy (1990); Inguinal hernia repair (Bilateral, 1975); Lithotripsy; and Colonoscopy with propofol (N/A, 02/24/2017). Mr. HPetrovichas a current medication list which includes the following prescription(s): anoro ellipta, aspirin ec, cetirizine, fluticasone, hydrochlorothiazide, hydrocodone-acetaminophen, [START ON 07/17/2019] hydrocodone-acetaminophen, [START ON 08/16/2019] hydrocodone-acetaminophen, isosorbide mononitrate, losartan, methocarbamol, mometasone, naloxone, neomycin-polymyxin-hydrocortisone, nitroglycerin, nutritional supplements, pregabalin, rosuvastatin, tadalafil, prednisone, and secukinumab. He  reports that he has been smoking cigarettes. He has a 38.25 pack-year smoking history. He has never used smokeless tobacco. He reports current alcohol use of about 4.0 standard drinks of alcohol per week. He reports that he does not use drugs. Mr. HKrupkais allergic to lisinopril and oxycodone.   HPI  Today, he is being contacted for worsening of previously known (established) problem.  The patient indicates having attained good relief of the pain in the left lower back for approximately 3  days after the diagnostic sacroiliac joint block.  However, the pain then returned and right now he indicates that it feels even worse than it was before.  The patient indicates that his primary pain right now is his lower  back, but his neck is still hurting.  In terms of the lower back, his left side seems to be worst on the right.  He also has pain going down to the left hip area.  He indicates having had 2 prior back surgeries and before the last one, he was having pain going down the leg, but that is not what he is experiencing right now.  He denies any type of events that he can think of that might have provoked this pain.  Today I will go ahead and order an x-ray of the lumbar spine on flexion and extension and due to acute nature of his pain I will provide him with a prescription for a steroid taper.  He is schedule to come in on Tuesday for treatment of his cervical spine, but if his back is giving him a lot of discomfort, we will consider switching the treatment to that area.  Post-Procedure Evaluation  Procedure: Diagnostic left sacroiliac joint injection #1 under fluoroscopic guidance and IV sedation Pre-procedure pain level: 8/10 Post-procedure: 0/10 (100% relief)  Sedation: Sedation provided.  Effectiveness during initial hour after procedure(Ultra-Short Term Relief): 100 %.  Local anesthetic used: Long-acting (4-6 hours) Effectiveness: Defined as any analgesic benefit obtained secondary to the administration of local anesthetics. This carries significant diagnostic value as to the etiological location, or anatomical origin, of the pain. Duration of benefit is expected to coincide with the duration of the local anesthetic used.  Effectiveness during initial 4-6 hours after procedure(Short-Term Relief): 100 %.  Long-term benefit: Defined as any relief past the pharmacologic duration of the local anesthetics.  Effectiveness past the initial 6 hours after procedure(Long-Term Relief): 100 %  (pain relief lasted approx 3 days and then began returning.).  Current benefits: Defined as benefit that persist at this time.   Analgesia:  Back to baseline Function: Back to baseline ROM: Back to baseline  Pharmacotherapy Assessment  Analgesic: Hydrocodone/APAP 5/325 1 tablet every 6 hours (20 mg/day of hydrocodone).  (Unable to tolerate an oxycodone IR trial due to stomachaches, nausea, headaches, and excessive somnolence.) MME/day: 20 mg/day.   Monitoring: [place] PMP: PDMP reviewed during this encounter.       Pharmacotherapy: No side-effects or adverse reactions reported. Compliance: No problems identified. Effectiveness: Clinically acceptable. Plan: Refer to "POC".  UDS:  Summary  Date Value Ref Range Status  12/21/2017 FINAL  Final    Comment:    ==================================================================== TOXASSURE SELECT 13 (MW) ==================================================================== Test                             Result       Flag       Units Drug Present and Declared for Prescription Verification   Oxycodone                      276          EXPECTED   ng/mg creat   Oxymorphone                    1086         EXPECTED   ng/mg creat   Noroxycodone                   1214         EXPECTED   ng/mg creat   Noroxymorphone  397          EXPECTED   ng/mg creat    Sources of oxycodone are scheduled prescription medications.    Oxymorphone, noroxycodone, and noroxymorphone are expected    metabolites of oxycodone. Oxymorphone is also available as a    scheduled prescription medication. Drug Absent but Declared for Prescription Verification   Hydrocodone                    Not Detected UNEXPECTED ng/mg creat ==================================================================== Test                      Result    Flag   Units      Ref Range   Creatinine              96               mg/dL       >=20 ==================================================================== Declared Medications:  The flagging and interpretation on this report are based on the  following declared medications.  Unexpected results may arise from  inaccuracies in the declared medications.  **Note: The testing scope of this panel includes these medications:  Hydrocodone (Norco)  Oxycodone  **Note: The testing scope of this panel does not include following  reported medications:  Acetaminophen (Norco)  Albuterol  Aspirin (Aspirin 81)  Fluticasone (Flovent)  Hydrochlorothiazide (Hydrodiuril)  Isosorbide (Imdur)  Losartan (Cozaar)  Methocarbamol (Robaxin)  Metoprolol (Lopressor)  Nitroglycerin (Nitrostat)  Pregabalin (Lyrica)  Rosuvastatin (Crestor)  Secukinumab  Tiotropium (Spiriva)  Triamcinolone (Kenalog) ==================================================================== For clinical consultation, please call [phone]. ====================================================================     Laboratory Chemistry Profile   Renal Lab Results  Component Value Date   BUN 16 04/01/2019   CREATININE 0.75 04/01/2019   BCR 17 03/19/2019   GFRAA >60 04/01/2019   GFRNONAA >60 04/01/2019     Hepatic Lab Results  Component Value Date   AST 22 08/30/2018   ALT 23 08/30/2018   ALBUMIN 3.8 08/30/2018   ALKPHOS 71 08/30/2018     Electrolytes Lab Results  Component Value Date   NA 138 04/01/2019   K 3.9 04/01/2019   CL 98 04/01/2019   CALCIUM 9.4 04/01/2019   MG 2.0 05/30/2018     Bone Lab Results  Component Value Date   25OHVITD1 32 02/17/2017   25OHVITD2 <1.0 02/17/2017   25OHVITD3 32 02/17/2017   TESTOFREE 5.2 (L) 02/28/2017   TESTOSTERONE 279 02/28/2017     Inflammation (CRP: Acute Phase) (ESR: Chronic Phase) Lab Results  Component Value Date   CRP 8 11/07/2017   ESRSEDRATE 26 11/07/2017       Note: Above Lab results reviewed.   Imaging  DG PAIN CLINIC C-ARM  1-60 MIN NO REPORT Fluoro was used, but no Radiologist interpretation will be provided.  Please refer to "NOTES" tab for provider progress note.  Assessment  The primary encounter diagnosis was Acute low back pain (Left) w/o sciatica. Diagnoses of Chronic neck pain with history of cervical spinal surgery, DDD (degenerative disc disease), lumbosacral, Other intervertebral disc degeneration, lumbar region, and Failed back surgical syndrome were also pertinent to this visit.  Plan of Care  Problem-specific:  No problem-specific Assessment & Plan notes found for this encounter.  Mr. ADRIK KHIM has a current medication list which includes the following long-term medication(s): cetirizine, hydrocodone-acetaminophen, [START ON 07/17/2019] hydrocodone-acetaminophen, [START ON 08/16/2019] hydrocodone-acetaminophen, isosorbide mononitrate, losartan, methocarbamol, nitroglycerin, pregabalin, rosuvastatin, and tadalafil.  Pharmacotherapy (Medications Ordered): Meds ordered  this encounter  Medications  . predniSONE (DELTASONE) 20 MG tablet    Sig: Take 3 tablets (60 mg total) by mouth daily with breakfast for 5 days, THEN 2 tablets (40 mg total) daily with breakfast for 5 days, THEN 1 tablet (20 mg total) daily with breakfast for 5 days.    Dispense:  30 tablet    Refill:  0   Orders:  Orders Placed This Encounter  Procedures  . DG Lumbar Spine Complete W/Bend    Patient presents with axial pain with possible radicular component.  In addition to any acute findings, please report on:  1. Facet (Zygapophyseal) joint DJD (Hypertrophy, space narrowing, subchondral sclerosis, and/or osteophyte formation) 2. DDD and/or IVDD (Loss of disc height, desiccation or "Black disc disease") 3. Pars defects 4. Spondylolisthesis, spondylosis, and/or spondyloarthropathies (include Degree/Grade of displacement in mm) 5. Vertebral body Fractures, including age (old, new/acute) 48. Modic Type Changes 7.  Demineralization 8. Bone pathology 9. Central, Lateral Recess, and/or Foraminal Stenosis (include AP diameter of stenosis in mm) 10. Surgical changes (hardware type, status, and presence of fibrosis)  NOTE: Please specify level(s) and laterality. If applicable: Please indicate ROM and/or evidence of instability (>60m displacement between flexion and extension views)    Standing Status:   Future    Standing Expiration Date:   09/28/2019    Order Specific Question:   Reason for Exam (SYMPTOM  OR DIAGNOSIS REQUIRED)    Answer:   Low back pain    Order Specific Question:   Preferred imaging location?    Answer:   [facility]    Order Specific Question:   Call Results- Best Contact Number?    Answer:   [phone]([facility]    Order Specific Question:   Radiology Contrast Protocol - do NOT remove file path    Answer:   \\[email or web address]\[email or web address].pdf  . [place]   Patient presents with axial pain with possible radicular component.  In addition to any acute findings, please report on:  1. Facet (Zygapophyseal) joint DJD (Hypertrophy, space narrowing, subchondral sclerosis, and/or osteophyte formation) 2. DDD and/or IVDD (Loss of disc height, desiccation or "Black disc disease") 3. Pars defects 4. Spondylolisthesis, spondylosis, and/or spondyloarthropathies (include Degree/Grade of displacement in mm) 5. Vertebral body Fractures, including age (old, new/acute) 665 Modic Type Changes 7. Demineralization 8. Bone pathology 9. Central, Lateral Recess, and/or Foraminal Stenosis (include AP diameter of stenosis in mm) 10. Surgical changes (hardware type, status, and presence of fibrosis)  NOTE: Please specify level(s) and laterality.    Standing Status:   Future    Standing Expiration Date:   09/28/2019    Order Specific Question:   What is the patient's sedation requirement?    Answer:   No Sedation    Order Specific Question:    Does the patient have a pacemaker or implanted devices?    Answer:   No    Order Specific Question:   Preferred imaging location?    Answer:   [facility] (table limit-350lbs)    Order Specific Question:   Call Results- Best Contact Number?    Answer:   [phone]([facility]    Order Specific Question:   Radiology Contrast Protocol - do NOT remove file path    Answer:   \\[email or web address]\Radiant\[email or web address].PDF   Follow-up plan:   Return for regular appointment.      Interventional management options: Considering:   Possible spinal cord stimulator trial  Possible bilateral cervical facet  RFA Diagnostic right ankle block Diagnosticright lumbar sympathetic block Diagnostic midlineTESI Diagnostic bilateral thoracic facet block Possible bilateral thoracic facet RFA   Palliative PRN treatment(s):   Palliative left CESI #4 Diagnostic bilateral cervical facet block#3 Palliative/diagnostic right-sidedCommon Peroneal NB#3 Therapeutic right-sided deep peroneal nerve block #2  Palliative right-sided common peroneal nerve RFA #2(last done on 06/21/2017)       Recent Visits Date Type Provider Dept  06/19/19 Procedure visit Milinda Pointer, MD [facility]  06/13/19 Telemedicine Milinda Pointer, MD [facility]  05/03/19 Procedure visit Milinda Pointer, MD [facility]  04/12/19 Telemedicine Milinda Pointer, MD [facility]  Showing recent visits within past 90 days and meeting all other requirements Today's Visits Date Type Provider Dept  06/28/19 Telemedicine Milinda Pointer, MD [facility]  Showing today's visits and meeting all other requirements Future Appointments Date Type Provider Dept  07/03/19 Appointment Milinda Pointer, [facility]  09/12/19 Appointment Milinda Pointer, MD [facility]  Showing future appointments within next 90 days and meeting  all other requirements  I discussed the assessment and treatment plan with the patient. The patient was provided an opportunity to ask questions and all were answered. The patient agreed with the plan and demonstrated an understanding of the instructions.  Patient advised to call back or seek an in-person evaluation if the symptoms or condition worsens.  Duration of encounter: 13 minutes.  Note by: Gaspar Cola, MD Date: 06/28/2019; Time: 1:48 PM

## 2019-06-28 NOTE — Telephone Encounter (Signed)
Please call his DME and have them check his machine for malfunction, increase humidity setting on the machine, make sire the patient knows how to fill the water chamber, so basic education about the device. Thanks

## 2019-06-28 NOTE — Patient Instructions (Signed)
____________________________________________________________________________________________  Preparing for Procedure with Sedation  Procedure appointments are limited to planned procedures: . No Prescription Refills. . No disability issues will be discussed. . No medication changes will be discussed.  Instructions: . Oral Intake: Do not eat or drink anything for at least 8 hours prior to your procedure. (Exception: Blood Pressure Medication. See below.) . Transportation: Unless otherwise stated by your physician, you may drive yourself after the procedure. . Blood Pressure Medicine: Do not forget to take your blood pressure medicine with a sip of water the morning of the procedure. If your Diastolic (lower reading)is above 100 mmHg, elective cases will be cancelled/rescheduled. . Blood thinners: These will need to be stopped for procedures. Notify our staff if you are taking any blood thinners. Depending on which one you take, there will be specific instructions on how and when to stop it. . Diabetics on insulin: Notify the staff so that you can be scheduled 1st case in the morning. If your diabetes requires high dose insulin, take only  of your normal insulin dose the morning of the procedure and notify the staff that you have done so. . Preventing infections: Shower with an antibacterial soap the morning of your procedure. . Build-up your immune system: Take 1000 mg of Vitamin C with every meal (3 times a day) the day prior to your procedure. . Antibiotics: Inform the staff if you have a condition or reason that requires you to take antibiotics before dental procedures. . Pregnancy: If you are pregnant, call and cancel the procedure. . Sickness: If you have a cold, fever, or any active infections, call and cancel the procedure. . Arrival: You must be in the facility at least 30 minutes prior to your scheduled procedure. . Children: Do not bring children with you. . Dress appropriately:  Bring dark clothing that you would not mind if they get stained. . Valuables: Do not bring any jewelry or valuables.  Reasons to call and reschedule or cancel your procedure: (Following these recommendations will minimize the risk of a serious complication.) . Surgeries: Avoid having procedures within 2 weeks of any surgery. (Avoid for 2 weeks before or after any surgery). . Flu Shots: Avoid having procedures within 2 weeks of a flu shots or . (Avoid for 2 weeks before or after immunizations). . Barium: Avoid having a procedure within 7-10 days after having had a radiological study involving the use of radiological contrast. (Myelograms, Barium swallow or enema study). . Heart attacks: Avoid any elective procedures or surgeries for the initial 6 months after a "Myocardial Infarction" (Heart Attack). . Blood thinners: It is imperative that you stop these medications before procedures. Let us know if you if you take any blood thinner.  . Infection: Avoid procedures during or within two weeks of an infection (including chest colds or gastrointestinal problems). Symptoms associated with infections include: Localized redness, fever, chills, night sweats or profuse sweating, burning sensation when voiding, cough, congestion, stuffiness, runny nose, sore throat, diarrhea, nausea, vomiting, cold or Flu symptoms, recent or current infections. It is specially important if the infection is over the area that we intend to treat. . Heart and lung problems: Symptoms that may suggest an active cardiopulmonary problem include: cough, chest pain, breathing difficulties or shortness of breath, dizziness, ankle swelling, uncontrolled high or unusually low blood pressure, and/or palpitations. If you are experiencing any of these symptoms, cancel your procedure and contact your primary care physician for an evaluation.  Remember:  Regular Business hours are:    Monday to Thursday 8:00 AM to 4:00 PM  Provider's  Schedule: Jahzaria Vary, MD:  Procedure days: Tuesday and Thursday 7:30 AM to 4:00 PM  Bilal Lateef, MD:  Procedure days: Monday and Wednesday 7:30 AM to 4:00 PM ____________________________________________________________________________________________    

## 2019-06-28 NOTE — Telephone Encounter (Signed)
Order has been placed to adapt to check pt's machine for malfunction and increase humidity settings.  Pt is aware to fill water chamber up to the "fill line".  Nothing further is needed at this time.

## 2019-06-29 ENCOUNTER — Telehealth: Payer: Self-pay

## 2019-06-29 NOTE — Telephone Encounter (Signed)
Copied from [place] [phone]. Topic: General - Other >> Jun 29, 2019  4:14 PM Erick Blinks wrote: Pt called requesting that PCP fax over [place] Duty exemption document to the [place] [employer] office. Please advise Fax number: [phone] DOS: 07/04/2019  Attention: Eli Hose ([place] of [place])

## 2019-07-02 ENCOUNTER — Encounter: Payer: Self-pay | Admitting: Family Medicine

## 2019-07-02 ENCOUNTER — Other Ambulatory Visit: Payer: Self-pay | Admitting: Family Medicine

## 2019-07-02 NOTE — Telephone Encounter (Signed)
Need Juror # and date of jury duty.

## 2019-07-03 ENCOUNTER — Ambulatory Visit (HOSPITAL_BASED_OUTPATIENT_CLINIC_OR_DEPARTMENT_OTHER): Payer: Medicaid Other | Admitting: Pain Medicine

## 2019-07-03 ENCOUNTER — Ambulatory Visit
Admission: RE | Admit: 2019-07-03 | Discharge: 2019-07-03 | Disposition: A | Discharge status: Home / Self Care | Payer: Medicaid Other | Source: Ambulatory Visit | Attending: Pain Medicine | Admitting: Pain Medicine

## 2019-07-03 ENCOUNTER — Encounter: Payer: Self-pay | Admitting: Pain Medicine

## 2019-07-03 ENCOUNTER — Other Ambulatory Visit: Payer: Self-pay

## 2019-07-03 VITALS — BP 121/82 | HR 66 | Temp 98.2°F | Resp 13 | Ht 68.0 in | Wt 200.0 lb

## 2019-07-03 DIAGNOSIS — M5010 Cervical disc disorder with radiculopathy, unspecified cervical region: Secondary | ICD-10-CM

## 2019-07-03 DIAGNOSIS — M542 Cervicalgia: Secondary | ICD-10-CM | POA: Diagnosis not present

## 2019-07-03 DIAGNOSIS — M4802 Spinal stenosis, cervical region: Secondary | ICD-10-CM

## 2019-07-03 DIAGNOSIS — Z9889 Other specified postprocedural states: Secondary | ICD-10-CM | POA: Diagnosis not present

## 2019-07-03 DIAGNOSIS — M4722 Other spondylosis with radiculopathy, cervical region: Secondary | ICD-10-CM | POA: Diagnosis not present

## 2019-07-03 DIAGNOSIS — M5030 Other cervical disc degeneration, unspecified cervical region: Secondary | ICD-10-CM | POA: Insufficient documentation

## 2019-07-03 DIAGNOSIS — G8928 Other chronic postprocedural pain: Secondary | ICD-10-CM | POA: Insufficient documentation

## 2019-07-03 MED ORDER — LIDOCAINE HCL 2 % IJ SOLN
INTRAMUSCULAR | Status: AC
  Filled 2019-07-03: qty 10

## 2019-07-03 MED ORDER — DEXAMETHASONE SODIUM PHOSPHATE 10 MG/ML IJ SOLN
INTRAMUSCULAR | Status: AC
  Filled 2019-07-03: qty 1

## 2019-07-03 MED ORDER — LIDOCAINE HCL 2 % IJ SOLN
20.0000 mL | Freq: Once | INTRAMUSCULAR | Status: AC
  Administered 2019-07-03: 200 mg

## 2019-07-03 MED ORDER — LACTATED RINGERS IV SOLN
1000.0000 mL | Freq: Once | INTRAVENOUS | Status: AC
  Administered 2019-07-03: 1000 mL via INTRAVENOUS

## 2019-07-03 MED ORDER — ROPIVACAINE HCL 2 MG/ML IJ SOLN
1.0000 mL | Freq: Once | INTRAMUSCULAR | Status: AC
  Administered 2019-07-03: 10 mL via EPIDURAL

## 2019-07-03 MED ORDER — MIDAZOLAM HCL 5 MG/5ML IJ SOLN
INTRAMUSCULAR | Status: AC
  Filled 2019-07-03: qty 5

## 2019-07-03 MED ORDER — ROPIVACAINE HCL 2 MG/ML IJ SOLN
INTRAMUSCULAR | Status: AC
  Filled 2019-07-03: qty 10

## 2019-07-03 MED ORDER — FENTANYL CITRATE (PF) 100 MCG/2ML IJ SOLN
25.0000 ug | INTRAMUSCULAR | Status: DC | PRN
  Administered 2019-07-03: 50 ug via INTRAVENOUS

## 2019-07-03 MED ORDER — SODIUM CHLORIDE (PF) 0.9 % IJ SOLN
INTRAMUSCULAR | Status: AC
  Filled 2019-07-03: qty 10

## 2019-07-03 MED ORDER — MIDAZOLAM HCL 5 MG/5ML IJ SOLN
1.0000 mg | INTRAMUSCULAR | Status: DC | PRN
  Administered 2019-07-03: 2 mg via INTRAVENOUS

## 2019-07-03 MED ORDER — SODIUM CHLORIDE 0.9% FLUSH
1.0000 mL | Freq: Once | INTRAVENOUS | Status: AC
  Administered 2019-07-03: 10 mL

## 2019-07-03 MED ORDER — FENTANYL CITRATE (PF) 100 MCG/2ML IJ SOLN
INTRAMUSCULAR | Status: AC
  Filled 2019-07-03: qty 2

## 2019-07-03 MED ORDER — DEXAMETHASONE SODIUM PHOSPHATE 10 MG/ML IJ SOLN
10.0000 mg | Freq: Once | INTRAMUSCULAR | Status: AC
  Administered 2019-07-03: 10 mg

## 2019-07-03 MED ORDER — IOHEXOL 180 MG/ML  SOLN
10.0000 mL | Freq: Once | INTRAMUSCULAR | Status: AC
  Administered 2019-07-03: 10 mL via EPIDURAL
  Filled 2019-07-03: qty 20

## 2019-07-03 NOTE — Patient Instructions (Signed)

## 2019-07-03 NOTE — Progress Notes (Signed)
PROVIDER NOTE: Information contained herein reflects review and annotations entered in association with encounter. Interpretation of such information and data should be left to medically-trained personnel. Information provided to patient can be located elsewhere in the medical record under "Patient Instructions". Document created using STT-dictation technology, any transcriptional errors that may result from process are unintentional.    Patient: Caleb [name]  Service Category: Procedure  Provider: Gaspar Cola, MD  DOB: [birth year]  DOS: 07/03/2019  Location: [facility]  MRN: [id number]  Setting: Ambulatory - outpatient  Referring Provider: Virginia Crews, MD  Type: Established Patient  Specialty: Interventional Pain Management  PCP: Virginia Crews, MD   Primary Reason for Visit: Interventional Pain Management Treatment. CC: Neck Pain (mid)  Procedure:          Anesthesia, Analgesia, Anxiolysis:  Type: Diagnostic, Inter-Laminar, Cervical Epidural Steroid Injection  #4  Region: Posterior Cervico-thoracic Region Level: C7-T1 Laterality: Left-Sided Paramedial  Type: Moderate (Conscious) Sedation combined with Local Anesthesia Indication(s): Analgesia and Anxiety Route: Intravenous (IV) IV Access: Secured Sedation: Meaningful verbal contact was maintained at all times during the procedure  Local Anesthetic: Lidocaine 1-2%  Position: Prone with head of the table was raised to facilitate breathing.   Indications: 1. Cervicalgia (4th area of Pain) (Bilateral) (L>R)   2. Cervical spondylosis w/ radiculopathy   3. 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 Cervical disc disorder with radiculopathy of cervical region   5. DDD (degenerative disc disease), cervical   6. Chronic neck pain with history of cervical spinal surgery    Pain Score: Pre-procedure: 6 /10 Post-procedure: 0-No pain/10   Pre-op Assessment:  Caleb [name] is a  64 y.o. (year old), male patient, seen today for interventional treatment. He  has a past surgical history that includes Cardiac catheterization (2015); Lumbar laminectomy (1989, 1999); Cervical fusion (1988, 1998); Liver surgery (2015); Partial colectomy (1990); Inguinal hernia repair (Bilateral, 1975); Lithotripsy; and Colonoscopy with propofol (N/A, 02/24/2017). Caleb [name] has a current medication list which includes the following prescription(s): anoro ellipta, aspirin ec, cetirizine, fluticasone, hydrochlorothiazide, hydrocodone-acetaminophen, [START ON 07/17/2019] hydrocodone-acetaminophen, [START ON 08/16/2019] hydrocodone-acetaminophen, isosorbide mononitrate, losartan, methocarbamol, mometasone, naloxone, neomycin-polymyxin-hydrocortisone, nitroglycerin, nutritional supplements, prednisone, pregabalin, rosuvastatin, secukinumab, and tadalafil, and the following Facility-Administered Medications: fentanyl and midazolam. His primarily concern today is the Neck Pain (mid)  Initial Vital Signs:  Pulse/HCG Rate: 66ECG Heart Rate: 69 Temp: 98.1 F (36.7 C) Resp: 16 BP: 127/87 SpO2: 99 %  BMI: Estimated body mass index is 30.41 kg/m as calculated from the following:   Height as of this encounter: 5\' 8"  (1.727 m).   Weight as of this encounter: 200 lb (90.7 kg).  Risk Assessment: Allergies: Reviewed. He is allergic to lisinopril and oxycodone.  Allergy Precautions: None required Coagulopathies: Reviewed. None identified.  Blood-thinner therapy: None at this time Active Infection(s): Reviewed. None identified. Caleb [name] is afebrile  Site Confirmation: Caleb [name] was asked to confirm the procedure and laterality before marking the site Procedure checklist: Completed Consent: Before the procedure and under the influence of no sedative(s), amnesic(s), or anxiolytics, the patient was informed of the treatment options, risks and possible complications. To fulfill our ethical and legal obligations,  as recommended by the American Medical Association's Code of Ethics, I have informed the patient of my clinical impression; the nature and purpose of the treatment or procedure; the risks, benefits, and possible complications of the intervention; the alternatives, including doing nothing; the risk(s) and benefit(s) of  the alternative treatment(s) or procedure(s); and the risk(s) and benefit(s) of doing nothing. The patient was provided information about the general risks and possible complications associated with the procedure. These may include, but are not limited to: failure to achieve desired goals, infection, bleeding, organ or nerve damage, allergic reactions, paralysis, and death. In addition, the patient was informed of those risks and complications associated to Spine-related procedures, such as failure to decrease pain; infection (i.e.: Meningitis, epidural or intraspinal abscess); bleeding (i.e.: epidural hematoma, subarachnoid hemorrhage, or any other type of intraspinal or peri-dural bleeding); organ or nerve damage (i.e.: Any type of peripheral nerve, nerve root, or spinal cord injury) with subsequent damage to sensory, motor, and/or autonomic systems, resulting in permanent pain, numbness, and/or weakness of one or several areas of the body; allergic reactions; (i.e.: anaphylactic reaction); and/or death. Furthermore, the patient was informed of those risks and complications associated with the medications. These include, but are not limited to: allergic reactions (i.e.: anaphylactic or anaphylactoid reaction(s)); adrenal axis suppression; blood sugar elevation that in diabetics may result in ketoacidosis or comma; water retention that in patients with history of congestive heart failure may result in shortness of breath, pulmonary edema, and decompensation with resultant heart failure; weight gain; swelling or edema; medication-induced neural toxicity; particulate matter embolism and blood vessel  occlusion with resultant organ, and/or nervous system infarction; and/or aseptic necrosis of one or more joints. Finally, the patient was informed that Medicine is not an exact science; therefore, there is also the possibility of unforeseen or unpredictable risks and/or possible complications that may result in a catastrophic outcome. The patient indicated having understood very clearly. We have given the patient no guarantees and we have made no promises. Enough time was given to the patient to ask questions, all of which were answered to the patient's satisfaction. Mr. Gentles has indicated that he wanted to continue with the procedure. Attestation: I, the ordering provider, attest that I have discussed with the patient the benefits, risks, side-effects, alternatives, likelihood of achieving goals, and potential problems during recovery for the procedure that I have provided informed consent. Date  Time: 07/03/2019  9:32 AM  Pre-Procedure Preparation:  Monitoring: As per clinic protocol. Respiration, ETCO2, SpO2, BP, heart rate and rhythm monitor placed and checked for adequate function Safety Precautions: Patient was assessed for positional comfort and pressure points before starting the procedure. Time-out: I initiated and conducted the "Time-out" before starting the procedure, as per protocol. The patient was asked to participate by confirming the accuracy of the "Time Out" information. Verification of the correct person, site, and procedure were performed and confirmed by me, the nursing staff, and the patient. "Time-out" conducted as per Joint Commission's Universal Protocol (UP.01.01.01). Time: 1008  Description of Procedure:          Target Area: For Epidural Steroid injections the target is the interlaminar space, initially targeting the lower border of the superior vertebral body lamina. Approach: Paramedial approach. Area Prepped: Entire PosteriorCervical Region DuraPrep (Iodine Povacrylex  [0.7% available iodine] and Isopropyl Alcohol, 74% w/w) Safety Precautions: Aspiration looking for blood return was conducted prior to all injections. At no point did we inject any substances, as a needle was being advanced. No attempts were made at seeking any paresthesias. Safe injection practices and needle disposal techniques used. Medications properly checked for expiration dates. SDV (single dose vial) medications used. Description of the Procedure: Protocol guidelines were followed. The procedure needle was introduced through the skin, ipsilateral to the reported pain,  and advanced to the target area. Bone was contacted and the needle walked caudad, until the lamina was cleared. The epidural space was identified using "loss-of-resistance technique" with 2-3 ml of PF-NaCl (0.9% NSS), in a 5cc LOR glass syringe. Vitals:   07/03/19 1012 07/03/19 1022 07/03/19 1032 07/03/19 1044  BP: (!) 133/92 122/85 120/80 121/82  Pulse:      Resp: 16 12 15 13   Temp:  98.6 F (37 C)  98.2 F (36.8 C)  SpO2: 93% 97% 98% 98%  Weight:      Height:        Start Time: 1008 hrs. End Time: 1012 hrs. Materials:  Needle(s) Type: Epidural needle Gauge: 17G Length: 3.5-in Medication(s): Please see orders for medications and dosing details.  Imaging Guidance (Spinal):          Type of Imaging Technique: Fluoroscopy Guidance (Spinal) Indication(s): Assistance in needle guidance and placement for procedures requiring needle placement in or near specific anatomical locations not easily accessible without such assistance. Exposure Time: Please see nurses notes. Contrast: Before injecting any contrast, we confirmed that the patient did not have an allergy to iodine, shellfish, or radiological contrast. Once satisfactory needle placement was completed at the desired level, radiological contrast was injected. Contrast injected under live fluoroscopy. No contrast complications. See chart for type and volume of contrast  used. Fluoroscopic Guidance: I was personally present during the use of fluoroscopy. "Tunnel Vision Technique" used to obtain the best possible view of the target area. Parallax error corrected before commencing the procedure. "Direction-depth-direction" technique used to introduce the needle under continuous pulsed fluoroscopy. Once target was reached, antero-posterior, oblique, and lateral fluoroscopic projection used confirm needle placement in all planes. Images permanently stored in EMR. Interpretation: I personally interpreted the imaging intraoperatively. Adequate needle placement confirmed in multiple planes. Appropriate spread of contrast into desired area was observed. No evidence of afferent or efferent intravascular uptake. No intrathecal or subarachnoid spread observed. Permanent images saved into the patient's record.  Antibiotic Prophylaxis:   Anti-infectives (From admission, onward)   None     Indication(s): None identified  Post-operative Assessment:  Post-procedure Vital Signs:  Pulse/HCG Rate: 6665 Temp: 98.2 F (36.8 C) Resp: 13 BP: 121/82 SpO2: 98 %  EBL: None  Complications: No immediate post-treatment complications observed by team, or reported by patient.  Note: The patient tolerated the entire procedure well. A repeat set of vitals were taken after the procedure and the patient was kept under observation following institutional policy, for this type of procedure. Post-procedural neurological assessment was performed, showing return to baseline, prior to discharge. The patient was provided with post-procedure discharge instructions, including a section on how to identify potential problems. Should any problems arise concerning this procedure, the patient was given instructions to immediately contact us, at any time, without hesitation. In any case, we plan to contact the patient by telephone for a follow-up status report regarding this interventional procedure.   Comments:  No additional relevant information.  Plan of Care  Orders:  Orders Placed This Encounter  Procedures  . Cervical Epidural Injection    Procedure: Cervical Epidural Steroid Injection/Block Purpose: Palliative Indication(s): Radiculitis and cervicalgia associater with cervical degenerative disc disease.    Scheduling Instructions:     Level(s): C7-T1     Laterality: Left-sided     Sedation: With Sedation.     Timeframe: Today    Order Specific Question:   Where will this procedure be performed?    Answer:  [facility]    Comments:   by Dr. Dossie Arbour  . [facility] C-ARM 1-60 MIN NO REPORT    Intraoperative interpretation by procedural physician at [facility].    Standing Status:   Standing    Number of Occurrences:   1    Order Specific Question:   Reason for exam:    Answer:   Assistance in needle guidance and placement for procedures requiring needle placement in or near specific anatomical locations not easily accessible without such assistance.  . Informed Consent Details: Physician/Practitioner Attestation; Transcribe to consent form and obtain patient signature    Nursing Order: Transcribe to consent form and obtain patient signature. Note: Always confirm laterality of pain with Mr. Frye, before procedure.  Procedure: Cervical Epidural Steroid Injection (CESI) under fluoroscopic guidance  Indication/Reason: Cervicalgia (Neck Pain) with or without Cervical Radiculopathy/Radiculitis (Arm/Shoulder Pain, Numbness, and/or weakness), secondary to Cervical and/or Cervicothoracic Degenerative Disc Disease (DDD), with or without Intervertebral Disc Displacement (IVDD).  Provider Attestation: I, Breathitt Dossie Arbour, MD, (Pain Management Specialist), the physician/practitioner, attest that I have discussed with the patient the benefits, risks, side effects, alternatives, likelihood of achieving goals and potential problems during recovery for the  procedure that I have provided informed consent.  . Provide equipment / supplies at bedside    Equipment required: Single use, disposable, "Epidural Tray" Epidural Catheter: NOT required    Standing Status:   Standing    Number of Occurrences:   1    Order Specific Question:   Specify    Answer:   Epidural Tray   Chronic Opioid Analgesic:  Hydrocodone/APAP 5/325 1 tablet every 6 hours (20 mg/day of hydrocodone).  (Unable to tolerate an oxycodone IR trial due to stomachaches, nausea, headaches, and excessive somnolence.) MME/day: 20 mg/day.   Medications ordered for procedure: Meds ordered this encounter  Medications  . iohexol (OMNIPAQUE) 180 MG/ML injection 10 mL    Must be Myelogram-compatible. If not available, you may substitute with a water-soluble, non-ionic, hypoallergenic, myelogram-compatible radiological contrast medium.  Marland Kitchen lidocaine (XYLOCAINE) 2 % (with pres) injection 400 mg  . lactated ringers infusion 1,000 mL  . midazolam (VERSED) 5 MG/5ML injection 1-2 mg    Make sure Flumazenil is available in the pyxis when using this medication. If oversedation occurs, administer 0.2 mg IV over 15 sec. If after 45 sec no response, administer 0.2 mg again over 1 min; may repeat at 1 min intervals; not to exceed 4 doses (1 mg)  . fentaNYL (SUBLIMAZE) injection 25-50 mcg    Make sure Narcan is available in the pyxis when using this medication. In the event of respiratory depression (RR< 8/min): Titrate NARCAN (naloxone) in increments of 0.1 to 0.2 mg IV at 2-3 minute intervals, until desired degree of reversal.  . sodium chloride flush (NS) 0.9 % injection 1 mL  . ropivacaine (PF) 2 mg/mL (0.2%) (NAROPIN) injection 1 mL  . dexamethasone (DECADRON) injection 10 mg   Medications administered: We administered iohexol, lidocaine, lactated ringers, midazolam, fentaNYL, sodium chloride flush, ropivacaine (PF) 2 mg/mL (0.2%), and dexamethasone.  See the medical record for exact dosing, route,  and time of administration.  Follow-up plan:   Return in about 2 weeks (around 07/17/2019) for (VV), (PP).       Interventional management options: Considering:   Possible spinal cord stimulator trial  Possible bilateral cervical facet RFA Diagnostic right ankle block Diagnosticright lumbar sympathetic block Diagnostic midlineTESI Diagnostic bilateral thoracic facet block Possible bilateral thoracic  facet RFA   Palliative PRN treatment(s):   Palliative left CESI #4 Diagnostic bilateral cervical facet block#3 Palliative/diagnostic right-sidedCommon Peroneal NB#3 Therapeutic right-sided deep peroneal nerve block #2  Palliative right-sided common peroneal nerve RFA #2(last done on 06/21/2017)    Recent Visits Date Type Provider Dept  06/28/19 Telemedicine Milinda Pointer, MD [facility]  06/19/19 Procedure visit Milinda Pointer, MD [facility]  06/13/19 Telemedicine Milinda Pointer, MD [facility]  05/03/19 Procedure visit Milinda Pointer, MD [facility]  04/12/19 Telemedicine Milinda Pointer, MD [facility]  Showing recent visits within past 90 days and meeting all other requirements Today's Visits Date Type Provider Dept  07/03/19 Procedure visit Milinda Pointer, MD [facility]  Showing today's visits and meeting all other requirements Future Appointments Date Type Provider Dept  07/18/19 Appointment Milinda Pointer, MD [facility]  09/12/19 Appointment Milinda Pointer, MD [facility]  Showing future appointments within next 90 days and meeting all other requirements  Disposition: Discharge home  Discharge (Date  Time): 07/03/2019; 1045 hrs.   Primary Care Physician: Virginia Crews, MD Location: [facility] Note by: Gaspar Cola, MD Date: 07/03/2019; Time: 10:49 AM  Disclaimer:  Medicine is not an Chief Strategy Officer.  The only guarantee in medicine is that nothing is guaranteed. It is important to note that the decision to proceed with this intervention was based on the information collected from the patient. The Data and conclusions were drawn from the patient's questionnaire, the interview, and the physical examination. Because the information was provided in large part by the patient, it cannot be guaranteed that it has not been purposely or unconsciously manipulated. Every effort has been made to obtain as much relevant data as possible for this evaluation. It is important to note that the conclusions that lead to this procedure are derived in large part from the available data. Always take into account that the treatment will also be dependent on availability of resources and existing treatment guidelines, considered by other Pain Management Practitioners as being common knowledge and practice, at the time of the intervention. For Medico-Legal purposes, it is also important to point out that variation in procedural techniques and pharmacological choices are the acceptable norm. The indications, contraindications, technique, and results of the above procedure should only be interpreted and judged by a Board-Certified Interventional Pain Specialist with extensive familiarity and expertise in the same exact procedure and technique.

## 2019-07-03 NOTE — Telephone Encounter (Signed)
Left detailed message for patient to call back with juror # and date of jury duty. PEC please relay and ask for information.

## 2019-07-03 NOTE — Progress Notes (Signed)
Safety precautions to be maintained throughout the outpatient stay will include: orient to surroundings, keep bed in low position, maintain call bell within reach at all times, provide assistance with transfer out of bed and ambulation.  

## 2019-07-03 NOTE — Telephone Encounter (Signed)
Pt called and stated that the date is 6/30 tomorrow and he does not have a Juror #.   Fax number [phone] Attention Eli Hose

## 2019-07-04 ENCOUNTER — Telehealth: Payer: Self-pay

## 2019-07-04 NOTE — Telephone Encounter (Signed)
Post procedure phone call.  Patient states he is doing good.  

## 2019-07-05 ENCOUNTER — Other Ambulatory Visit: Payer: Self-pay

## 2019-07-05 ENCOUNTER — Ambulatory Visit: Payer: Medicaid Other | Attending: Pain Medicine | Admitting: Pain Medicine

## 2019-07-05 ENCOUNTER — Telehealth: Payer: Self-pay | Admitting: Pain Medicine

## 2019-07-05 ENCOUNTER — Encounter: Payer: Self-pay | Admitting: Pain Medicine

## 2019-07-05 VITALS — BP 115/75 | HR 76 | Resp 18 | Ht 68.0 in | Wt 200.0 lb

## 2019-07-05 DIAGNOSIS — M545 Low back pain, unspecified: Secondary | ICD-10-CM

## 2019-07-05 MED ORDER — KETOROLAC TROMETHAMINE 60 MG/2ML IM SOLN
60.0000 mg | Freq: Once | INTRAMUSCULAR | Status: AC
  Administered 2019-07-05: 60 mg via INTRAMUSCULAR

## 2019-07-05 MED ORDER — ORPHENADRINE CITRATE 30 MG/ML IJ SOLN
INTRAMUSCULAR | Status: AC
  Filled 2019-07-05: qty 2

## 2019-07-05 MED ORDER — ORPHENADRINE CITRATE 30 MG/ML IJ SOLN
60.0000 mg | Freq: Once | INTRAMUSCULAR | Status: AC
  Administered 2019-07-05: 60 mg via INTRAMUSCULAR

## 2019-07-05 MED ORDER — KETOROLAC TROMETHAMINE 60 MG/2ML IM SOLN
INTRAMUSCULAR | Status: AC
  Filled 2019-07-05: qty 2

## 2019-07-05 NOTE — Telephone Encounter (Signed)
I did not write any letter for this patient - I have not even viewed the chart until now ?

## 2019-07-05 NOTE — Telephone Encounter (Signed)
Patient states he is in too much pain and needs some help with this. Please call asap

## 2019-07-05 NOTE — Telephone Encounter (Signed)
Suli, do you know if this message has been taken care of?

## 2019-07-05 NOTE — Progress Notes (Signed)
Patient: Caleb [name]  Service Category: E/M  Provider: Gaspar Cola, MD  DOB: 01/01/1956  DOS: 07/05/2019  Referring Provider: Virginia Crews, MD  MRN: [id number]  Setting: Ambulatory outpatient  PCP: Virginia Crews, MD  Type: Established Patient  Specialty: Interventional Pain Management    Location: Office  Delivery: Face-to-face     Purpose:  The patient comes in today for IM therapy. Case was discussed with attending physician.    Subjective:  [name] is a 64 y.o. year old, male patient, who comes today for a nurse visit complaining of Back Pain His last contact with [place] was on 07/05/2019. Severity of the pain is described as a 8 /10.     No notes on file    Objective:  Mr. Palazzi  height is 5\' 8"  (1.727 m) and weight is 200 lb (90.7 kg). His blood pressure is 115/75 and his pulse is 76. His respiration is 18 and oxygen saturation is 96%.  Body mass index is 30.41 kg/m.  Analgesic:  Hydrocodone/APAP 5/325 1 tablet every 6 hours (20 mg/day of hydrocodone).  (Unable to tolerate an oxycodone IR trial due to stomachaches, nausea, headaches, and excessive somnolence.) MME/day: 20 mg/day.     Allergies:  Patient is allergic to lisinopril and oxycodone.    Labs:  Lab Results  Component Value Date   BUN 16 04/01/2019   CREATININE 0.75 04/01/2019   GFRAA >60 04/01/2019   GFRNONAA >60 04/01/2019      Assessment:  The encounter diagnosis was Acute low back pain (Left) w/o sciatica.    Attestation: Medical screening examination/treatment/procedure(s) were performed by non-physician practitioner and as supervising physician I was immediately available for consultation/collaboration.    Plan of Care  Orders:  No orders of the defined types were placed in this encounter.  Chronic Opioid Analgesic:  Hydrocodone/APAP 5/325 1 tablet every 6 hours (20 mg/day of hydrocodone).  (Unable to tolerate an oxycodone IR trial due to stomachaches, nausea, headaches,  and excessive somnolence.) MME/day: 20 mg/day.   Medications ordered for procedure: Meds ordered this encounter  Medications  . ketorolac (TORADOL) injection 60 mg  . orphenadrine (NORFLEX) injection 60 mg   Medications administered: We administered ketorolac and orphenadrine.  See the medical record for exact dosing, route, and time of administration.  Follow-up plan:   Return for regular appointment.       Interventional management options: Considering:   Possible spinal cord stimulator trial  Possible bilateral cervical facet RFA Diagnostic right ankle block Diagnosticright lumbar sympathetic block Diagnostic midlineTESI Diagnostic bilateral thoracic facet block Possible bilateral thoracic facet RFA   Palliative PRN treatment(s):   Palliative left CESI #4 Diagnostic bilateral cervical facet block#3 Palliative/diagnostic right-sidedCommon Peroneal NB#3 Therapeutic right-sided deep peroneal nerve block #2  Palliative right-sided common peroneal nerve RFA #2(last done on 06/21/2017)     Recent Visits Date Type Provider Dept  07/03/19 Procedure visit Milinda Pointer, MD [facility]  06/28/19 Telemedicine Milinda Pointer, MD [facility]  06/19/19 Procedure visit Milinda Pointer, MD [facility]  06/13/19 Telemedicine Milinda Pointer, MD [facility]  05/03/19 Procedure visit Milinda Pointer, MD [facility]  04/12/19 Telemedicine Milinda Pointer, MD [facility]  Showing recent visits within past 90 days and meeting all other requirements Today's Visits Date Type Provider Dept  07/05/19 Office Visit Milinda Pointer, MD [facility]  Showing today's visits and meeting all other requirements Future Appointments Date Type Provider Dept  07/18/19 Appointment Milinda Pointer, MD [facility]  09/12/19 Appointment Milinda Pointer, MD [facility]   Showing future appointments within next 90 days and meeting all other requirements  Disposition: Discharge home  Discharge (Date  Time): 07/05/2019; 1503 hrs.   Primary Care Physician: Virginia Crews, MD Location: [facility] Note by: Gaspar Cola, MD Date: 07/05/2019; Time: 3:08 PM  Disclaimer:  Medicine is not an Chief Strategy Officer. The only guarantee in medicine is that nothing is guaranteed. It is important to note that the decision to proceed with this intervention was based on the information collected from the patient. The Data and conclusions were drawn from the patient's questionnaire, the interview, and the physical examination. Because the information was provided in large part by the patient, it cannot be guaranteed that it has not been purposely or unconsciously manipulated. Every effort has been made to obtain as much relevant data as possible for this evaluation. It is important to note that the conclusions that lead to this procedure are derived in large part from the available data. Always take into account that the treatment will also be dependent on availability of resources and existing treatment guidelines, considered by other Pain Management Practitioners as being common knowledge and practice, at the time of the intervention. For Medico-Legal purposes, it is also important to point out that variation in procedural techniques and pharmacological choices are the acceptable norm. The indications, contraindications, technique, and results of the above procedure should only be interpreted and judged by a Board-Certified Interventional Pain Specialist with extensive familiarity and expertise in the same exact procedure and technique.

## 2019-07-05 NOTE — Telephone Encounter (Signed)
Patient called back to let us know that his jury was to start yesterday. July 04, 2019.

## 2019-07-05 NOTE — Telephone Encounter (Signed)
Pain in the lower back. Asked Dr. Dossie Arbour if he could have Toradol/Norflex, ok to come today. Patient notified.

## 2019-07-05 NOTE — Telephone Encounter (Signed)
Sharyn Lull, this message was still in [employer] nurse box. Has this patient already been taken care of and notified? I see that you wrote a letter for patient to be excused from jury duty.

## 2019-07-05 NOTE — Telephone Encounter (Signed)
Printed letter in chart but it should have the Juror # on it that was on his letter of notice for jury duty.

## 2019-07-05 NOTE — Telephone Encounter (Signed)
No it has not been taken care of.

## 2019-07-06 DIAGNOSIS — G4733 Obstructive sleep apnea (adult) (pediatric): Secondary | ICD-10-CM | POA: Diagnosis not present

## 2019-07-08 ENCOUNTER — Other Ambulatory Visit: Payer: Self-pay | Admitting: Family Medicine

## 2019-07-10 ENCOUNTER — Ambulatory Visit: Payer: Medicaid Other

## 2019-07-12 ENCOUNTER — Telehealth: Payer: Self-pay | Admitting: Pain Medicine

## 2019-07-12 NOTE — Telephone Encounter (Signed)
Patient called stating his foot is swollen and on fire. Cant take this pain, what are any suggestions Dr. Dossie Arbour has to help. Please call asap

## 2019-07-13 ENCOUNTER — Telehealth: Payer: Self-pay

## 2019-07-13 DIAGNOSIS — M545 Low back pain, unspecified: Secondary | ICD-10-CM

## 2019-07-13 DIAGNOSIS — G894 Chronic pain syndrome: Secondary | ICD-10-CM

## 2019-07-13 NOTE — Telephone Encounter (Signed)
Spoke with patient.  Patient very agitated.  Upset that we did not call him back yesterday afternoon.  Apologized for not calling patient back due to being at a funeral for coworker.  Patient states that he has been on the phone all day with various departments.  States we never ordered an MRI, there is no one here to cover on the weekends if needed and the doctors were always on vacation. Offerred him the opportunity to speak with my supervisor and he declined.  States he will be in here on Tuesday to speak to my boss and her boss.  Encouraged patient to speak with Hinton Dyer at this time.  Patient agreed to speak to [facility].

## 2019-07-13 NOTE — Telephone Encounter (Signed)
Sure but no one else locally, would have to go to either [place], [place] or [employer]

## 2019-07-13 NOTE — Telephone Encounter (Signed)
Copied from [place] [id number]. Topic: Referral - Request for Referral >> Jul 13, 2019 10:29 AM Lennox Solders wrote: Pt has seen dr b for his foot, back and neck and pt was referred to [facility] dr Lowella Dandy . Pt no longer want to see [facility]. Pt would like another referral to different pain management clinic. Pt has medicaid

## 2019-07-13 NOTE — Telephone Encounter (Signed)
Please review for Dr. Jacinto Reap. It is okay to refer to another pain clinic?  Thanks,   -Mickel Baas

## 2019-07-13 NOTE — Addendum Note (Signed)
Addended by: Shawna Orleans on: 07/13/2019 12:01 PM   Modules accepted: Orders

## 2019-07-15 ENCOUNTER — Other Ambulatory Visit: Payer: Self-pay

## 2019-07-15 ENCOUNTER — Ambulatory Visit
Admission: RE | Admit: 2019-07-15 | Discharge: 2019-07-15 | Disposition: A | Discharge status: Home / Self Care | Payer: Medicaid Other | Source: Ambulatory Visit | Attending: Pain Medicine | Admitting: Pain Medicine

## 2019-07-15 DIAGNOSIS — M5126 Other intervertebral disc displacement, lumbar region: Secondary | ICD-10-CM | POA: Diagnosis not present

## 2019-07-15 DIAGNOSIS — M545 Low back pain, unspecified: Secondary | ICD-10-CM

## 2019-07-15 DIAGNOSIS — M5137 Other intervertebral disc degeneration, lumbosacral region: Secondary | ICD-10-CM | POA: Diagnosis not present

## 2019-07-15 DIAGNOSIS — M48061 Spinal stenosis, lumbar region without neurogenic claudication: Secondary | ICD-10-CM | POA: Diagnosis not present

## 2019-07-15 DIAGNOSIS — M47816 Spondylosis without myelopathy or radiculopathy, lumbar region: Secondary | ICD-10-CM | POA: Diagnosis not present

## 2019-07-15 DIAGNOSIS — M5136 Other intervertebral disc degeneration, lumbar region: Secondary | ICD-10-CM | POA: Insufficient documentation

## 2019-07-15 DIAGNOSIS — M5127 Other intervertebral disc displacement, lumbosacral region: Secondary | ICD-10-CM | POA: Diagnosis not present

## 2019-07-15 DIAGNOSIS — M961 Postlaminectomy syndrome, not elsewhere classified: Secondary | ICD-10-CM | POA: Insufficient documentation

## 2019-07-16 NOTE — Telephone Encounter (Signed)
Called patient to inform him that we do not have the results of his MRI yet but I will keep watch for it and get it to Dr Dossie Arbour as soon as we get it.  Patient states understanding.

## 2019-07-17 NOTE — Progress Notes (Signed)
PROVIDER NOTE: Information contained herein reflects review and annotations entered in association with encounter. Interpretation of such information and data should be left to medically-trained personnel. Information provided to patient can be located elsewhere in the medical record under "Patient Instructions". Document created using STT-dictation technology, any transcriptional errors that may result from process are unintentional.    Patient: Caleb [name]  Service Category: E/M  Provider: Oswaldo Done, MD  DOB: May 03, 1955  DOS: 07/18/2019  Specialty: Interventional Pain Management  MRN: [id number]  Setting: Ambulatory outpatient  PCP: Erasmo Downer, MD  Type: Established Patient    Referring Provider: Erasmo Downer, MD  Location: Office  Delivery: Face-to-face     HPI  Reason for encounter: [name], a 64 y.o. year old male, is here today for evaluation and management of his Chronic pain syndrome [G89.4]. Mr. Ditullio primary complain today is Neck Pain Last encounter: Practice (07/12/2019). My last encounter with him was on 07/12/2019. Pertinent problems: Mr. 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); Cervicalgia (4th area of Pain) (Bilateral) (L>R); DDD (degenerative disc disease), thoracic; Disorder of superficial peroneal nerve (Right); Chronic upper extremity pain (Left); Cervical spondylosis w/ radiculopathy; Cervical disc disorder with radiculopathy of cervical region; Chronic upper extremity weakness (Left); Abnormal nerve conduction studies (06/08/2017); 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); Spondylosis without myelopathy or radiculopathy, cervical region; Cervical facet syndrome (Bilateral); Abnormal MRI, cervical spine (08/13/2017); History of fusion of cervical spine; Chronic neck pain (Bilateral) (L>R); Chronic neck pain with history of cervical spinal surgery; LLQ abdominal pain; Neuropathic pain; Nondisplaced fracture of proximal phalanx of left thumb with routine healing; 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 Pain Assessment: Severity of Chronic pain is reported as a 7 /10. Location: Neck Upper/pain radiaties down to shoulder. Onset: More than a month ago. Quality: Aching, Burning, Throbbing, Stabbing, Nagging. Timing: Constant. Modifying factor(s): pain cream. Vitals:  height is 5\' 8"  (1.727 m) and weight is 200 lb (90.7 kg). His temperature is 99 F (37.2 C). His blood pressure is 127/89 and his pulse is 84. His oxygen saturation is 100%.   The patient comes into the clinics today with acute on chronic cervicalgia and cervical radiculopathy.  He has a history of 2 prior cervical surgeries and he refers that the last one was done close to 30 years ago.  We have been helping his cervicalgia and cervical radiculopathies but doing occasional left-sided cervical epidural steroid injections the last of which was on 07/03/2019.  Typically the patient gets several months of pain relief from these injections and they work well to the point where we have not felt the need to do anything else.  However, his cervical pathology seems to be worsening and this last injection has not provided him with any benefit of more than 2 weeks.  He refers that 2 days ago  he was sleeping and he woke up with significant neck pain and bilateral upper extremity  numbness that he felt was excruciating.  He recently finished a steroid taper and despite this, he continues to have problems.  Today I have given him an IM injection of Toradol 60 mg + Norflex 60 mg, which has provided him with some relief of today's pain.  In addition to this, today we went over the results of his recent lumbar MRI.  Until recently his primary pain was that of the lower back and right lower extremity.  He does have a history of trauma to the right leg and he indicates having pain that is going all the way down into his foot with complete numbness of his big toe.  This represents a problem because apparently he has injured the toe and not realize that and currently see seems to be having an infection in the area.  I have instructed the patient to go see his primary care physician to have that treated.  In addition, I have given him some pointers as to how to take care of this injury and to certainly not have it like he has it today where he is walking around without socks and with the exposed wound clocked into an old shoe that it is less than clean.  I explained to him that he needs to take this very seriously since he could develop osteomyelitis of his big toe, which may later require amputation.  Because of his injury, I asked him if he had any diabetes but he indicated not having any.  Apparently this numbness in his big toe appears to be nerve damage either at the L5 level or involving the superficial peroneal nerve and/or deep peroneal nerve.  In any case, because his lower extremity condition keeps returning, today I have placed a referral to a medical psychologist for him to be tested to see if he would be a good candidate for either a peripheral nerve stimulator or an intrathecal pump.  However, this will not solve the cervical problems.  He has an MRI of the cervical spine that was done in 2019 that  showed significant spinal stenosis.  Because he had been responding to the cervical epidural steroid injections this was not taken any further than that, but at this point his cervical radiculopathy and symptoms in the cervical spine may have been worsening to the point where it warrants to have the cervical MRI repeated and also for him to see a neurosurgeon.  He requested to see Dr. Barnett Abu as this neurosurgeon has been taking care of his family, he knows him, and he feels that he would like to have him take a look at his case.  I have taken the steps to send a referral to Dr. Danielle Dess for the cervical spinal stenosis, which I believe needs to be treated first.  The patient does agree with this.  However, we also have a recent MRI that would suggest that he would benefit from an evaluation from a neurosurgeon in terms of the lateral recess stenosis and foraminal stenosis that he has around the L4-5 and L5-S1 levels.  Regarding the patient's medications, he has indicated that he feels he needs to take something stronger than hydrocodone/APAP 5/325.  However, he was unable to tolerate the oxycodone and therefore today we have decided to do a trial with morphine IR 15 mg, 1 tablet p.o. 3 times daily.  I reminded the patient that while taking the morphine he should not take any of the hydrocodone since this could cause a drug  drug interaction leading to respiratory depression and death.  He indicated having understood clearly.  Currently he has a 20 mg/day MME, but once he goes on the morphine this will go up to 45 mg/day MME.  I have scheduled him to return to clinics in approximately 2 weeks to review this trial and if effective, we will cancel the hydrocodone prescriptions and then switch him to the morphine.  Pharmacotherapy Assessment   Analgesic: Hydrocodone/APAP 5/325 1 tablet every 6 hours (20 mg/day of hydrocodone).  (Unable to tolerate an oxycodone IR trial due to stomachaches, nausea, headaches,  and excessive somnolence.) MME/day: 20 mg/day.   Monitoring: [place] PMP: PDMP reviewed during this encounter.       Pharmacotherapy: No side-effects or adverse reactions reported. Compliance: No problems identified. Effectiveness: Clinically acceptable.  [name], RN  07/18/2019 10:03 AM  Sign when Signing Visit Safety precautions to be maintained throughout the outpatient stay will include: orient to surroundings, keep bed in low position, maintain call bell within reach at all times, provide assistance with transfer out of bed and ambulation.     UDS:  Summary  Date Value Ref Range Status  12/21/2017 FINAL  Final    Comment:    ==================================================================== TOXASSURE SELECT 13 (MW) ==================================================================== Test                             Result       Flag       Units Drug Present and Declared for Prescription Verification   Oxycodone                      276          EXPECTED   ng/mg creat   Oxymorphone                    1086         EXPECTED   ng/mg creat   Noroxycodone                   1214         EXPECTED   ng/mg creat   Noroxymorphone                 397          EXPECTED   ng/mg creat    Sources of oxycodone are scheduled prescription medications.    Oxymorphone, noroxycodone, and noroxymorphone are expected    metabolites of oxycodone. Oxymorphone is also available as a    scheduled prescription medication. Drug Absent but Declared for Prescription Verification   Hydrocodone                    Not Detected UNEXPECTED ng/mg creat ==================================================================== Test                      Result    Flag   Units      Ref Range   Creatinine              96               mg/dL      >=20 ==================================================================== Declared Medications:  The flagging and interpretation on this report are based on the  following  declared medications.  Unexpected results may arise from  inaccuracies in the declared medications.  **Note: The testing scope of this panel  includes these medications:  Hydrocodone (Norco)  Oxycodone  **Note: The testing scope of this panel does not include following  reported medications:  Acetaminophen (Norco)  Albuterol  Aspirin (Aspirin 81)  Fluticasone (Flovent)  Hydrochlorothiazide (Hydrodiuril)  Isosorbide (Imdur)  Losartan (Cozaar)  Methocarbamol (Robaxin)  Metoprolol (Lopressor)  Nitroglycerin (Nitrostat)  Pregabalin (Lyrica)  Rosuvastatin (Crestor)  Secukinumab  Tiotropium (Spiriva)  Triamcinolone (Kenalog) ==================================================================== For clinical consultation, please call [phone]. ====================================================================      ROS  Constitutional: Denies any fever or chills Gastrointestinal: No reported hemesis, hematochezia, vomiting, or acute GI distress Musculoskeletal: Denies any acute onset joint swelling, redness, loss of ROM, or weakness Neurological: No reported episodes of acute onset apraxia, aphasia, dysarthria, agnosia, amnesia, paralysis, loss of coordination, or loss of consciousness  Medication Review  HYDROcodone-acetaminophen, Nutritional Supplements, Secukinumab, aspirin EC, cetirizine, fluticasone, hydrochlorothiazide, isosorbide mononitrate, losartan, methocarbamol, mometasone, morphine, naloxone, neomycin-polymyxin-hydrocortisone, nitroGLYCERIN, predniSONE, pregabalin, rosuvastatin, tadalafil, and umeclidinium-vilanterol  History Review  Allergy: Mr. Sawatzky is allergic to lisinopril and oxycodone. Drug: Mr. Walmsley  reports no history of drug use. Alcohol:  reports current alcohol use of about 4.0 standard drinks of alcohol per week. Tobacco:  reports that he has been smoking cigarettes. He has a 38.25 pack-year smoking history. He has never used smokeless  tobacco. Social: Mr. Cianci  reports that he has been smoking cigarettes. He has a 38.25 pack-year smoking history. He has never used smokeless tobacco. He reports current alcohol use of about 4.0 standard drinks of alcohol per week. He reports that he does not use drugs. Medical:  has a past medical history of Chronic foot pain, right (2015), COPD (chronic obstructive pulmonary disease) ([place]), Coronary artery disease, Emphysema lung ([place]), Hyperlipidemia, Hypertension, Leucocytosis, Myocardial infarction ([place]) (2015), and OSA on CPAP. Surgical: Mr. Ream  has a past surgical history that includes Cardiac catheterization (2015); Lumbar laminectomy (1989, 1999); Cervical fusion (1988, 1998); Liver surgery (2015); Partial colectomy (1990); Inguinal hernia repair (Bilateral, 1975); Lithotripsy; and Colonoscopy with propofol (N/A, 02/24/2017). Family: family history includes Alzheimer's disease in his paternal grandmother; Alzheimer's disease (age of onset: 70) in his father; Breast cancer in his maternal uncle; CAD in his mother; Dementia in his father; Diabetes in his maternal grandmother; Healthy in his sister; Heart attack in his maternal uncle; Heart failure in his maternal grandmother; Heart failure (age of onset: 3) in his mother; Non-Hodgkin's lymphoma in his sister.  Laboratory Chemistry Profile   Renal Lab Results  Component Value Date   BUN 16 04/01/2019   CREATININE 0.75 04/01/2019   BCR 17 03/19/2019   GFRAA >60 04/01/2019   GFRNONAA >60 04/01/2019     Hepatic Lab Results  Component Value Date   AST 22 08/30/2018   ALT 23 08/30/2018   ALBUMIN 3.8 08/30/2018   ALKPHOS 71 08/30/2018     Electrolytes Lab Results  Component Value Date   NA 138 04/01/2019   K 3.9 04/01/2019   CL 98 04/01/2019   CALCIUM 9.4 04/01/2019   MG 2.0 05/30/2018     Bone Lab Results  Component Value Date   25OHVITD1 32 02/17/2017   25OHVITD2 <1.0 02/17/2017   25OHVITD3 32 02/17/2017   TESTOFREE  5.2 (L) 02/28/2017   TESTOSTERONE 279 02/28/2017     Inflammation (CRP: Acute Phase) (ESR: Chronic Phase) Lab Results  Component Value Date   CRP 8 11/07/2017   ESRSEDRATE 26 11/07/2017       Note: Above Lab results reviewed.  Recent Imaging Review  MR LUMBAR SPINE  WO CONTRAST CLINICAL DATA:  Low back and left leg pain  EXAM: MRI LUMBAR SPINE WITHOUT CONTRAST  TECHNIQUE: Multiplanar, multisequence MR imaging of the lumbar spine was performed. No intravenous contrast was administered.  COMPARISON:  None.  FINDINGS: Segmentation:  Standard.  Alignment:  Anteroposterior alignment is maintained.  Vertebrae: Lumbar vertebral body heights are preserved. No marrow edema. No suspicious osseous lesion.  Conus medullaris and cauda equina: Conus extends to the L1 level. Conus and cauda equina appear normal.  Paraspinal and other soft tissues: Unremarkable.  Disc levels:  T12-L1: Small right paracentral disc protrusion. No canal or foraminal stenosis.  L1-L2:  No canal or foraminal stenosis.  L2-L3:  Minimal disc bulge.  No canal or foraminal stenosis.  L3-L4:  Minimal disc bulge.  No canal or foraminal stenosis.  L4-L5: Disc bulge with endplate osteophytic ridging and facet arthropathy with ligamentum flavum infolding. Mild canal stenosis with slight narrowing of the lateral recesses. Moderate right and mild left foraminal stenosis.  L5-S1: Minimal disc bulge, endplate osteophytic ridging, and facet arthropathy. Prominent epidural fat partially effaces the thecal sac. No canal or right foraminal stenosis. Narrowing of the left lateral recess. Mild to moderate left foraminal stenosis.  IMPRESSION: Multilevel degenerative changes as detailed above. There is no high-grade canal stenosis. There is narrowing of the left lateral recess and left neural foramen at L5-S1.  Electronically Signed   By: Macy Mis M.D.   On: 07/16/2019 08:21 Note: Reviewed         Physical Exam  General appearance: Well nourished, well developed, and well hydrated. In no apparent acute distress Mental status: Alert, oriented x 3 (person, place, & time)       Respiratory: No evidence of acute respiratory distress Eyes: PERLA Vitals: BP 127/89   Pulse 84   Temp 99 F (37.2 C)   Ht '5\' 8"'$  (1.727 m)   Wt 200 lb (90.7 kg)   SpO2 100%   BMI 30.41 kg/m  BMI: Estimated body mass index is 30.41 kg/m as calculated from the following:   Height as of this encounter: '5\' 8"'$  (1.727 m).   Weight as of this encounter: 200 lb (90.7 kg). Ideal: Ideal body weight: 68.4 kg (150 lb 12.7 oz) Adjusted ideal body weight: 77.3 kg (170 lb 7.6 oz)  Assessment   Status Diagnosis  Controlled Controlled Controlled 1. Chronic pain syndrome   2. Cervicalgia   3. Cervical spondylosis w/ radiculopathy   4. Cervical disc disorder with radiculopathy of cervical region   5. 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 Abnormal MRI, cervical spine (08/13/2017)   8. Failed back surgical syndrome   9. Chronic foot pain (1ry area of Pain) (Right)   10. Chronic ankle pain (2ry area of Pain) (Right)   11. Pharmacologic therapy      Updated Problems: No problems updated.  Plan of Care  Problem-specific:  No problem-specific Assessment & Plan notes found for this encounter.  Mr. LAMOND GLANTZ has a current medication list which includes the following long-term medication(s): cetirizine, hydrocodone-acetaminophen, [START ON 08/16/2019] hydrocodone-acetaminophen, isosorbide mononitrate, losartan, methocarbamol, morphine, nitroglycerin, pregabalin, rosuvastatin, and tadalafil.  Pharmacotherapy (Medications Ordered): Meds ordered this encounter  Medications  . ketorolac (TORADOL) injection 60 mg  . orphenadrine (NORFLEX) injection 60 mg  . predniSONE (DELTASONE) 20 MG tablet    Sig: Take 3 tablets (60 mg  total) by mouth daily with breakfast for 5  days, THEN 2 tablets (40 mg total) daily with breakfast for 5 days, THEN 1 tablet (20 mg total) daily with breakfast for 5 days.    Dispense:  30 tablet    Refill:  0  . morphine (MSIR) 15 MG tablet    Sig: Take 1 tablet (15 mg total) by mouth every 8 (eight) hours as needed for up to 15 days for moderate pain or severe pain. Must last 15 days.    Dispense:  45 tablet    Refill:  0    [place] STOP ACT - Not applicable. Fill one day early if pharmacy is closed on scheduled refill date. Do not fill until: 07/18/2019 . Must last 30 days. To last until: 08/02/2019.   Orders:  Orders Placed This Encounter  Procedures  . MR CERVICAL SPINE WO CONTRAST    Patient presents with axial pain with possible radicular component.  In addition to any acute findings, please report on:  1. Facet (Zygapophyseal) joint DJD (Hypertrophy, space narrowing, subchondral sclerosis, and/or osteophyte formation) 2. DDD and/or IVDD (Loss of disc height, desiccation or "Black disc disease") 3. Pars defects 4. Spondylolisthesis, spondylosis, and/or spondyloarthropathies (include Degree/Grade of displacement in mm) 5. Vertebral body Fractures, including age (old, new/acute) 62. Modic Type Changes 7. Demineralization 8. Bone pathology 9. Central, Lateral Recess, and/or Foraminal Stenosis (include AP diameter of stenosis in mm) 10. Surgical changes (hardware type, status, and presence of fibrosis) NOTE: Please specify level(s) and laterality.    Standing Status:   Future    Standing Expiration Date:   10/18/2019    Order Specific Question:   What is the patient's sedation requirement?    Answer:   No Sedation    Order Specific Question:   Does the patient have a pacemaker or implanted devices?    Answer:   No    Order Specific Question:   Preferred imaging location?    Answer:   [facility] (table limit-350lbs)    Order Specific Question:   Call Results- Best Contact  Number?    Answer:   [phone] ([facility])    Order Specific Question:   Radiology Contrast Protocol - do NOT remove file path    Answer:   [email or web address]    Order Specific Question:   ** REASON FOR EXAM (FREE TEXT)    Answer:   Neck Pain & Radiculitis  . ToxASSURE Select 13 (MW), Urine    Volume: 30 ml(s). Minimum 3 ml of urine is needed. Document temperature of fresh sample. Indications: Long term (current) use of opiate analgesic (G90.211)    Order Specific Question:   Release to patient    Answer:   Immediate  . Ambulatory referral to Psychology    Referral Priority:   Routine    Referral Type:   Psychiatric    Referral Reason:   Specialty Services Required    Requested Specialty:   Psychology    Number of Visits Requested:   1  . Ambulatory referral to Neurosurgery    Referral Priority:   Routine    Referral Type:   Surgical    Referral Reason:   Specialty Services Required    Referred to Provider:   Kristeen Miss, MD    Requested Specialty:   Neurosurgery    Number of Visits Requested:   1   Follow-up plan:   Return in about 2 weeks (around 08/01/2019) for F2F(20-min), MM (never on procedure day) for morphine trial evaluation.  Interventional management options: Considering:   Possible spinal cord stimulator trial  Possible bilateral cervical facet RFA Diagnostic right ankle block Diagnosticright lumbar sympathetic block Diagnostic midlineTESI Diagnostic bilateral thoracic facet block Possible bilateral thoracic facet RFA   Palliative PRN treatment(s):   Palliative left CESI #4 Diagnostic bilateral cervical facet block#3 Palliative/diagnostic right-sidedCommon Peroneal NB#3 Therapeutic right-sided deep peroneal nerve block #2  Palliative right-sided common peroneal nerve RFA #2(last done on 06/21/2017)        Recent Visits Date Type Provider Dept  07/05/19 Office Visit Milinda Pointer, MD [facility]  07/03/19 Procedure visit Milinda Pointer, MD [facility]  06/28/19 Telemedicine Milinda Pointer, MD [facility]  06/19/19 Procedure visit Milinda Pointer, MD [facility]  06/13/19 Telemedicine Milinda Pointer, MD [facility]  05/03/19 Procedure visit Milinda Pointer, MD [facility]  Showing recent visits within past 90 days and meeting all other requirements Today's Visits Date Type Provider Dept  07/18/19 Office Visit Milinda Pointer, MD [facility]  Showing today's visits and meeting all other requirements Future Appointments Date Type Provider Dept  09/12/19 Appointment Milinda Pointer, MD [facility]  Showing future appointments within next 90 days and meeting all other requirements  I discussed the assessment and treatment plan with the patient. The patient was provided an opportunity to ask questions and all were answered. The patient agreed with the plan and demonstrated an understanding of the instructions.  Patient advised to call back or seek an in-person evaluation if the symptoms or condition worsens.  Duration of encounter: 41 minutes.  Note by: Gaspar Cola, MD Date: 07/18/2019; Time: 11:01 AM

## 2019-07-18 ENCOUNTER — Encounter: Payer: Self-pay | Admitting: Pain Medicine

## 2019-07-18 ENCOUNTER — Ambulatory Visit: Payer: Medicaid Other | Attending: Pain Medicine | Admitting: Pain Medicine

## 2019-07-18 ENCOUNTER — Other Ambulatory Visit: Payer: Self-pay

## 2019-07-18 ENCOUNTER — Telehealth: Payer: Self-pay | Admitting: *Deleted

## 2019-07-18 VITALS — BP 127/89 | HR 84 | Temp 99.0°F | Ht 68.0 in | Wt 200.0 lb

## 2019-07-18 DIAGNOSIS — Z79899 Other long term (current) drug therapy: Secondary | ICD-10-CM | POA: Diagnosis not present

## 2019-07-18 DIAGNOSIS — M961 Postlaminectomy syndrome, not elsewhere classified: Secondary | ICD-10-CM | POA: Insufficient documentation

## 2019-07-18 DIAGNOSIS — M4802 Spinal stenosis, cervical region: Secondary | ICD-10-CM | POA: Diagnosis not present

## 2019-07-18 DIAGNOSIS — M542 Cervicalgia: Secondary | ICD-10-CM | POA: Diagnosis not present

## 2019-07-18 DIAGNOSIS — G8929 Other chronic pain: Secondary | ICD-10-CM | POA: Diagnosis not present

## 2019-07-18 DIAGNOSIS — M25571 Pain in right ankle and joints of right foot: Secondary | ICD-10-CM | POA: Diagnosis not present

## 2019-07-18 DIAGNOSIS — M5412 Radiculopathy, cervical region: Secondary | ICD-10-CM | POA: Insufficient documentation

## 2019-07-18 DIAGNOSIS — R937 Abnormal findings on diagnostic imaging of other parts of musculoskeletal system: Secondary | ICD-10-CM | POA: Insufficient documentation

## 2019-07-18 DIAGNOSIS — M79671 Pain in right foot: Secondary | ICD-10-CM | POA: Diagnosis not present

## 2019-07-18 DIAGNOSIS — M5010 Cervical disc disorder with radiculopathy, unspecified cervical region: Secondary | ICD-10-CM | POA: Diagnosis not present

## 2019-07-18 DIAGNOSIS — G894 Chronic pain syndrome: Secondary | ICD-10-CM | POA: Diagnosis not present

## 2019-07-18 DIAGNOSIS — M4722 Other spondylosis with radiculopathy, cervical region: Secondary | ICD-10-CM | POA: Diagnosis not present

## 2019-07-18 MED ORDER — MORPHINE SULFATE 15 MG PO TABS: 15.0000 mg | ORAL_TABLET | Freq: Three times a day (TID) | ORAL | 0 refills | Status: DC | PRN

## 2019-07-18 MED ORDER — ORPHENADRINE CITRATE 30 MG/ML IJ SOLN
60.0000 mg | Freq: Once | INTRAMUSCULAR | Status: AC
  Administered 2019-07-18: 60 mg via INTRAMUSCULAR

## 2019-07-18 MED ORDER — ORPHENADRINE CITRATE 30 MG/ML IJ SOLN
INTRAMUSCULAR | Status: AC
  Filled 2019-07-18: qty 2

## 2019-07-18 MED ORDER — KETOROLAC TROMETHAMINE 60 MG/2ML IM SOLN
60.0000 mg | Freq: Once | INTRAMUSCULAR | Status: AC
  Administered 2019-07-18: 60 mg via INTRAMUSCULAR

## 2019-07-18 MED ORDER — PREDNISONE 20 MG PO TABS: ORAL_TABLET | ORAL | 0 refills | Status: DC

## 2019-07-18 MED ORDER — KETOROLAC TROMETHAMINE 60 MG/2ML IM SOLN
INTRAMUSCULAR | Status: AC
  Filled 2019-07-18: qty 2

## 2019-07-18 NOTE — Progress Notes (Signed)
Safety precautions to be maintained throughout the outpatient stay will include: orient to surroundings, keep bed in low position, maintain call bell within reach at all times, provide assistance with transfer out of bed and ambulation.  

## 2019-07-18 NOTE — Patient Instructions (Signed)
Spinal Cord Stimulation Trial Information A spinal cord stimulation trial is a test to see whether a spinal cord stimulator reduces your pain. A spinal cord stimulator is a small device that is inserted (implanted) in your back. The stimulator has small wires (leads) that connect it to your spinal cord. The stimulator sends electrical pulses through the leads to the spinal cord. This can relieve pain. Settings for the stimulator can be adjusted with a remote device to find the best pain control. Your health care provider may suggest a spinal cord stimulation trial if other treatments for long-lasting (chronic) pain have not worked for you. Spinal cord stimulation may be used to manage pain that is caused by:  Coronary artery disease or peripheral vascular disease.  Failed back surgery.  Phantom limb sensation.  Peripheral neuropathy.  Complex regional pain syndrome.  Other syndromes that involve chronic pain. For the trial, the stimulator is attached to your back instead of inserted under the skin. A trial period is usually 3-5 days, but this can vary among health care providers. After your trial period, you and your health care provider will discuss whether a permanent spinal cord stimulator is an option for you. The permanent stimulator may be an option depending on:  Whether the stimulator reduces your pain during the trial.  Whether the stimulator fits into your lifestyle.  Whether the cost of the stimulator is covered by your insurance. What are the risks? Generally, a spinal cord stimulation trial is safe. However, problems can occur, including:  Bleeding or pain at the insertion site of the leads.  Infection at the insertion site or around the leads.  Allergic reactions to medicines, devices, or dyes.  Damage to the skin, nerves, back muscles, or spinal cord where the leads are placed.  Inability to move the legs (paralysis).  Numbness in the legs.  Inability to control  when you urinate or have a bowel movement (incontinence).  Spinal fluid leakage. How is a spinal cord stimulator placed for a trial?  For a trial period, the stimulator is placed on your skin, not under it. Only the leads that connect the stimulator to the spinal cord are implanted under your skin. The exact location of the stimulator depends on where you have pain. There are two types of surgery for implanting the leads:  Noninvasive surgery. In this type of surgery, a small incision is made and needles are used to place the leads under your skin.  Open surgery. In this type of surgery, a larger incision is made, and the leads are implanted directly into your back. How should I care for myself during the trial period? Activity  Return to your normal activities as told by your health care provider. Ask your health care provider what activities are safe for you.  Do not lift anything that is heavier than 10 lb (4.5 kg), or the limit that you are told. General instructions  Follow your health care provider's specific instructions about how to take care of your spinal cord stimulator and your incision.  Make sure you write down the following information so that you can share this information with your health care provider: ? Your responses to the stimulator. Describe these as told by your health care provider. ? Your pain level throughout the day. ? The amount and kind of pain medicine that you take.  Take over-the-counter and prescription medicines only as told by your health care provider.  Do not take baths, swim, or use a   hot tub until your health care provider approves.  Tell all health care providers who provide care for you that you have a spinal cord stimulator. This is important information that could affect the medical treatment that you receive.  Keep all follow-up visits as told by your health care provider. This is important. When should I seek medical care? During the  trial, seek medical care if:  You have redness, swelling, or pain around your incision.  You have fluid or blood coming from your incision.  Your incision feels warm to the touch.  You have pus or a bad smell coming from your incision.  The bandage (dressing) that covers your incision comes off. Get help right away if:  Your pain gets worse.  The stimulator leads come out.  You develop numbness or weakness in your legs, or you have difficulty walking.  You have problems urinating or having a bowel movement.  You have a fever.  You have symptoms that last for more than 2-3 days.  Your symptoms suddenly get worse. Summary  A spinal cord stimulator is a small device that sends electrical pulses to your spinal cord. This can relieve pain caused by many different health conditions.  Before a permanent stimulator is placed, you will have a trial using a temporary stimulator that is not implanted under your skin. This helps determine if a stimulator will reduce your pain.  For the trial, only the leads that connect the stimulator to the spinal cord are implanted under your skin.  During the trial period, make sure you write down information about your pain and your responses to the stimulator so that you can share this information with your health care provider.  Keep all follow-up visits as told by your health care provider. This is important. Contact your health care provider if you have symptoms that indicate a problem. This information is not intended to replace advice given to you by your health care provider. Make sure you discuss any questions you have with your health care provider. Document Revised: 09/15/2018 Document Reviewed: 01/25/2018 Elsevier Patient Education  2020 [employer]  

## 2019-07-21 LAB — TOXASSURE SELECT 13 (MW), URINE

## 2019-07-27 ENCOUNTER — Encounter: Payer: Self-pay | Admitting: Physician Assistant

## 2019-07-27 ENCOUNTER — Ambulatory Visit: Payer: Medicaid Other | Admitting: Physician Assistant

## 2019-07-27 ENCOUNTER — Other Ambulatory Visit: Payer: Self-pay

## 2019-07-27 VITALS — BP 96/64 | HR 79 | Temp 97.7°F | Wt 191.0 lb

## 2019-07-27 DIAGNOSIS — L400 Psoriasis vulgaris: Secondary | ICD-10-CM

## 2019-07-27 DIAGNOSIS — L409 Psoriasis, unspecified: Secondary | ICD-10-CM

## 2019-07-27 MED ORDER — MOMETASONE FUROATE 0.1 % EX SOLN: CUTANEOUS | 1 refills | Status: DC

## 2019-07-27 MED ORDER — FLUOCINOLONE ACETONIDE 0.01 % EX SOLN: Freq: Two times a day (BID) | CUTANEOUS | 0 refills | Status: DC

## 2019-07-27 NOTE — Patient Instructions (Signed)
Psoriasis Psoriasis is a long-term (chronic) skin condition. It occurs because your immune system causes skin cells to form too quickly. As a result, too many skin cells grow and create raised, red patches (plaques) that often look silvery on your skin. Plaques may show up anywhere on your body. They can be any size or shape. Symptoms of this condition range from mild to very severe. Psoriasis cannot be passed from one person to another (is not contagious). Sometimes, the symptoms go away and then come back again. What are the causes? The cause of psoriasis is not known, but certain factors can make the condition worse. These include:  Damage or trauma to the skin, such as cuts, scrapes, sunburn, and dryness.  Not enough exposure to sunlight.  Certain medicines.  Alcohol.  Tobacco use.  Stress.  Infections caused by bacteria or viruses. What increases the risk? You are more likely to develop this condition if you:  Have a family history of psoriasis.  Are obese.  Are 66-60 years old.  Are taking certain medicines. What are the signs or symptoms? There are different types of psoriasis. You can have more than one type of psoriasis during your life. The types are:  Plaque. This is the most common.  Guttate. This is also called eruptive psoriasis.  Inverse.  Pustular.  Erythrodermic.  Sebopsoriasis.  Psoriatic arthritis. Each type of psoriasis has different symptoms.  Plaque psoriasis symptoms include red, raised plaques with a silvery-white coating (scale). These plaques may be itchy. Your nails may be pitted and crumbly or fall off.  Guttate psoriasis symptoms include small red spots that often show up on your trunk, arms, and legs. These spots may develop after you have been sick, especially with strep throat.  Inverse psoriasis symptoms include plaques in your underarm area, under your breasts, or on your genitals, groin, or buttocks.  Pustular psoriasis symptoms  include pus-filled bumps that are painful, red, and swollen on the palms of your hands or the soles of your feet. You also may feel exhausted, feverish, weak, or have no appetite.  Erythrodermic psoriasis symptoms include bright red skin that may look burned. You may have a fast heartbeat and a body temperature that is too high or too low. You may be itchy or in pain.  Sebopsoriasis symptoms include red plaques that have a greasy coating, and are often on your scalp, forehead, and face.  Psoriatic arthritis causes swollen, painful joints along with scaly skin plaques. How is this diagnosed? This condition is diagnosed based on your symptoms, family history, and a physical exam.  You may also be referred to a health care provider who specializes in skin diseases (dermatologist).  Your health care provider may remove a tissue sample (biopsy) for testing. How is this treated? There is no cure for this condition, but treatment can help manage it. Goals of treatment include:  Helping your skin heal.  Reducing itching and inflammation.  Slowing the growth of new skin cells.  Helping your immune system respond better to your skin. Treatment varies, depending on the severity of your condition. This condition may be treated by:  Creams or ointments to help with symptoms.  Ultraviolet ray exposure (light therapy or phototherapy). This may include natural sunlight or light therapy in a medical office.  Medicines (systemic therapy). These medicines can help your body better manage skin cell turnover and inflammation. Medicines may be given in the form of pills or injections. They may be used along with light  therapy or ointments. You may also get antibiotic medicines if you have an infection. Follow these instructions at home: Skin Care  Moisturize your skin as needed. Only use moisturizers that have been approved by your health care provider.  Apply cool, wet cloths (cold compresses) to the  affected areas.  Do not use a hot tub or take hot showers. Take lukewarm showers and baths.  Do not scratch your skin. Lifestyle   Do not use any products that contain nicotine or tobacco, such as cigarettes, e-cigarettes, and chewing tobacco. If you need help quitting, ask your health care provider.  Use techniques for stress reduction, such as meditation or yoga.  Maintain a healthy weight. Follow instructions from your health care provider for weight control. These may include dietary restrictions.  Get safe exposure to the sun as told by your health care provider. This may include spending regular intervals of time outdoors in sunlight. Do not get sunburned.  Consider joining a psoriasis support group. Medicines  Take or use over-the-counter and prescription medicines only as told by your health care provider.  If you were prescribed an antibiotic medicine, take it as told by your health care provider. Do not stop using the antibiotic even if you start to feel better. Alcohol use  Limit how much you use: ? 0-1 drink a day for women. ? 0-2 drinks a day for men.  Be aware of how much alcohol is in your drink. In the [place] one drink equals one 12 oz bottle of beer (355 mL), one 5 oz glass of wine (148 mL), or one 1 oz glass of hard liquor (44 mL). General instructions  Keep a journal to help track what triggers an outbreak. Try to avoid any triggers.  See a counselor if feelings of sadness, frustration, and hopelessness about your condition are interfering with your work and relationships.  Keep all follow-up visits as told by your health care provider. This is important. Contact a health care provider if:  You have a fever.  Your pain gets worse.  You have increasing redness or warmth in the affected areas.  You have new or worsening pain or stiffness in your joints.  Your nails start to break easily or pull away from the nail bed.  You feel  depressed. Summary  Psoriasis is a long-term (chronic) skin condition. Patches (plaques) may show up anywhere on your body.  There is no cure for this condition, but treatment can help manage it. Treatment varies, depending on the severity of your condition.  Keep a journal to track what triggers an outbreak. Try to avoid any triggers.  Take or use over-the-counter and prescription medicines only as told by your health care provider.  Keep all follow-up visits as told by your health care provider. This is important. This information is not intended to replace advice given to you by your health care provider. Make sure you discuss any questions you have with your health care provider. Document Revised: 10/25/2017 Document Reviewed: 10/25/2017 Elsevier Patient Education  2020 Elsevier Inc.  

## 2019-07-27 NOTE — Progress Notes (Signed)
Established patient visit   Patient: Caleb [name]   DOB: 12-Mar-1955   64 y.o. Male  MRN: [id number] Visit Date: 07/27/2019  Today's healthcare provider: Mar Daring, PA-C   Chief Complaint  Patient presents with  . Psoriasis   Subjective    Psoriasis This is a recurrent problem. The problem has been gradually worsening since onset. Location: Right ear. Associated symptoms include itching and plaques. Treatments tried: Cosentyx. The treatment provided significant relief.    Pt needs referral to [facility]. He is established patient with the office but after their office computers were hacked they lost all patient information.    Medications: Outpatient Medications Prior to Visit  Medication Sig  . ANORO ELLIPTA 62.5-25 MCG/INH AEPB INHALE 1 PUFF BY MOUTH EVERY DAY  . aspirin EC 81 MG tablet Take 81 mg by mouth daily.  . cetirizine (ZYRTEC) 10 MG tablet TAKE 1 TABLET BY MOUTH EVERY DAY  . fluticasone (FLONASE) 50 MCG/ACT nasal spray SPRAY 2 SPRAYS INTO EACH NOSTRIL EVERY DAY  . hydrochlorothiazide (HYDRODIURIL) 25 MG tablet Take 25 mg by mouth daily.  . isosorbide mononitrate (IMDUR) 30 MG 24 hr tablet Take 0.5 tablets (15 mg total) by mouth daily.  Marland Kitchen losartan (COZAAR) 100 MG tablet TAKE 1 TABLET BY MOUTH EVERY DAY  . methocarbamol (ROBAXIN) 750 MG tablet Take 1 tablet (750 mg total) by mouth every 8 (eight) hours as needed for muscle spasms.  Marland Kitchen morphine (MSIR) 15 MG tablet Take 1 tablet (15 mg total) by mouth every 8 (eight) hours as needed for up to 15 days for moderate pain or severe pain. Must last 15 days.  . naloxone (NARCAN) nasal spray 4 mg/0.1 mL Use in case of opioid overdose  . neomycin-polymyxin-hydrocortisone (CORTISPORIN) 3.5-10000-1 OTIC suspension Place 3 drops into the right ear 4 (four) times daily. X 5-7 days  . nitroGLYCERIN (NITROSTAT) 0.4 MG SL tablet Place 1 tablet (0.4 mg total) under the tongue every 5 (five) minutes as needed for  chest pain.  . Nutritional Supplements (KETO PO) Take by mouth daily.  . predniSONE (DELTASONE) 20 MG tablet Take 3 tablets (60 mg total) by mouth daily with breakfast for 5 days, THEN 2 tablets (40 mg total) daily with breakfast for 5 days, THEN 1 tablet (20 mg total) daily with breakfast for 5 days.  . pregabalin (LYRICA) 150 MG capsule Take 1 capsule (150 mg total) by mouth 3 (three) times daily.  . rosuvastatin (CRESTOR) 40 MG tablet TAKE 1 TABLET BY MOUTH EVERY DAY  . tadalafil (CIALIS) 20 MG tablet Take 1 tablet (20 mg total) by mouth daily as needed for erectile dysfunction.  . [DISCONTINUED] mometasone (ELOCON) 0.1 % lotion APPLY 1 ML ONTO THE SKIN DAILY TO EARS FOR PSORIASIS  . HYDROcodone-acetaminophen (NORCO/VICODIN) 5-325 MG tablet Take 1 tablet by mouth every 6 (six) hours as needed for severe pain. Must last 30 days  . [START ON 08/16/2019] HYDROcodone-acetaminophen (NORCO/VICODIN) 5-325 MG tablet Take 1 tablet by mouth every 6 (six) hours as needed for severe pain. Must last 30 days  . Secukinumab (COSENTYX [place]) Inject into the skin every 30 (thirty) days.    No facility-administered medications prior to visit.    Review of Systems  Constitutional: Negative.   HENT: Positive for ear discharge.   Respiratory: Negative.   Cardiovascular: Negative.   Gastrointestinal: Negative.   Skin: Positive for itching and rash. Negative for color change, pallor and wound.  Neurological: Negative.  Objective    BP (!) 96/64 (BP Location: Left Arm, Patient Position: Sitting, Cuff Size: Large)   Pulse 79   Temp 97.7 F (36.5 C) (Temporal)   Wt 191 lb (86.6 kg)   BMI 29.04 kg/m    Physical Exam Vitals reviewed.  Constitutional:      General: He is not in acute distress.    Appearance: Normal appearance. He is well-developed, well-groomed and overweight. He is not diaphoretic.  HENT:     Head: Normocephalic and atraumatic.     Ears:     Comments: Dry flaky skin with swelling  and erythema Neck:     Thyroid: No thyromegaly.     Vascular: No JVD.     Trachea: No tracheal deviation.  Cardiovascular:     Rate and Rhythm: Normal rate and regular rhythm.     Heart sounds: Normal heart sounds. No murmur heard.  No friction rub. No gallop.   Pulmonary:     Effort: Pulmonary effort is normal. No respiratory distress.     Breath sounds: Normal breath sounds. No wheezing or rales.  Musculoskeletal:     Cervical back: Normal range of motion and neck supple.  Lymphadenopathy:     Cervical: No cervical adenopathy.  Skin:    Findings: Rash present.  Neurological:     Mental Status: He is alert.  Psychiatric:        Behavior: Behavior is cooperative.      No results found for any visits on 07/27/19.  Assessment & Plan     Problem List Items Addressed This Visit      Musculoskeletal and Integument   Plaque psoriasis - Primary    Previously as used Humira and Cosentyx Was controlled on Cosentyx Will give mometasone cream for skin Fluocinolone 0.01% for ears Referral placed back to [facility]       Relevant Medications   mometasone (ELOCON) 0.1 % lotion   fluocinolone (SYNALAR) 0.01 % external solution   Other Relevant Orders   Ambulatory referral to Dermatology      Return if symptoms worsen or fail to improve.      Reynolds Bowl, PA-C, have reviewed all documentation for this visit. The documentation on 07/31/19 for the exam, diagnosis, procedures, and orders are all accurate and complete.   Rubye Beach  [facility] [phone] (phone) [phone] (fax)  [place]

## 2019-07-31 ENCOUNTER — Encounter: Payer: Self-pay | Admitting: Pain Medicine

## 2019-07-31 NOTE — Assessment & Plan Note (Signed)
Previously as used Humira and Cosentyx Was controlled on Cosentyx Will give mometasone cream for skin Fluocinolone 0.01% for ears Referral placed back to [facility]

## 2019-07-31 NOTE — Progress Notes (Signed)
Patient: Caleb [name]  Service Category: E/M  Provider: Gaspar Cola, MD  DOB: 1955/10/18  DOS: 08/01/2019  Location: Office  MRN: [id number]  Setting: Ambulatory outpatient  Referring Provider: Virginia Crews, MD  Type: Established Patient  Specialty: Interventional Pain Management  PCP: Virginia Crews, MD  Location: Remote location  Delivery: TeleHealth     Virtual Encounter - Pain Management PROVIDER NOTE: Information contained herein reflects review and annotations entered in association with encounter. Interpretation of such information and data should be left to medically-trained personnel. Information provided to patient can be located elsewhere in the medical record under "Patient Instructions". Document created using STT-dictation technology, any transcriptional errors that may result from process are unintentional.    Contact & Pharmacy Preferred: [phone] Home: [phone] (home) Mobile: [phone] (mobile) E-mail: [email or web address]  [id number] [address]BA[address] [id number]: [phone]: [phone]  Pre-screening  Mr. HErnsbergeroffered "in-person" vs "virtual" encounter. [name] indicated preferring virtual for this encounter.   Reason COVID-19*  Social distancing based on CDC and AMA recommendations.   I contacted RMerian Capronon 08/01/2019 via telephone.      I clearly identified myself as FGaspar Cola MD. I verified that I was speaking with the correct person using two identifiers (Name: RKRISHIV [name] and date of birth: 1January 28, 1957.  Consent I sought verbal advanced consent from [name] virtual visit interactions. I informed Mr. HCerroof possible security and privacy concerns, risks, and limitations associated with providing "not-in-person" medical evaluation and management services. I also informed Mr. HCivelloof the availability of "in-person"  appointments. Finally, I informed him that there would be a charge for the virtual visit and that [name] could be  personally, fully or partially, financially responsible for it. Mr. HSonnenbergexpressed understanding and agreed to proceed.   Historic Elements   Mr. Caleb HOTTis a 64y.o. year old, male patient evaluated today after his last contact with our practice on 07/18/2019. [name] has a past medical history of Chronic foot pain, right (2015), COPD (chronic obstructive pulmonary disease) ([place], Coronary artery disease, Emphysema lung ([place], Hyperlipidemia, Hypertension, Leucocytosis, Myocardial infarction ([place] (2015), and OSA on CPAP. [name] also  has a past surgical history that includes Cardiac catheterization (2015); Lumbar laminectomy (1989, 1999); Cervical fusion (1988, 1998); Liver surgery (2015); Partial colectomy (1990); Inguinal hernia repair (Bilateral, 1975); Lithotripsy; and Colonoscopy with propofol (N/A, 02/24/2017). Mr. HWengerthas a current medication list which includes the following prescription(s): anoro ellipta, aspirin ec, cetirizine, fluocinolone, fluticasone, hydrochlorothiazide, isosorbide mononitrate, losartan, methocarbamol, mometasone, naloxone, neomycin-polymyxin-hydrocortisone, nitroglycerin, nutritional supplements, pregabalin, rosuvastatin, secukinumab, tadalafil, and hydrocodone-acetaminophen. [name]  reports that [name] has been smoking cigarettes. [name] has a 38.25 pack-year smoking history. [name] has never used smokeless tobacco. [name] reports current alcohol use of about 4.0 standard drinks of alcohol per week. [name] reports that [name] does not use drugs. Mr. HGoldmanis allergic to lisinopril and oxycodone.   Caleb  Today, [name] is being contacted for medication management.  On the patient's last visit we had done a morphine trial to see if this would help with his pain.  Previously [name] was on hydrocodone/APAP 5/325 1 tablet every 6 hours (20 mg/day of hydrocodone) (20 MME/day).  We then did a trial  of morphine IR 15 mg p.o. every 8 hours (45 mg/day morphine) (45 MME/day).  The patient indicated that it did help his pain, but [name]  completely changed his personality where [name] was no longer the "happy-go-lucky" guy that [name] normally is.  [name] described being very ill tempered, having no patience, and simply having a personality that [name] did not like.  Because of this [name] wants to completely come off of it and perhaps try the hydrocodone/APAP 7.5/325 1 tablet every 6 hours (30 mg/day of hydrocodone) (30 MME/day).  I have agreed to discontinue the morphine and to put him on the hydrocodone 7.5, with the understanding that this will be the very last change in dose, medicine, or schedule that I will ever do.  The patient was reminded that our goal is to get this pain under better control with the interventional therapies to then completely come off of the opioids.  [name] agreed to this goal.  At this point [name] indicates his worst pain to be that of the neck where [name] states that [name] woke up this morning unable to move the neck and with significant pain going down the shoulder and weakness in the arms.  [name] was initially referred to Dr. Ellene [name], since the patient apparently knew him.  Unfortunately, that practice does not take the patient's insurance.  [name] has requested to be sent to a different neurosurgeon.  Today I will be entering a referral to the [facility] neurosurgery group.  [name] recently had his cervical MRI approved and [name] is already scheduled for it.  Hopefully the neurosurgeons will be able to help him with the cervical spinal stenosis to the point where this will not be an issue and I can then go back to work again on his lower extremity pain.  Today [name] indicated that [name] spoke to one of his friends that had a similar injury to his and [name] described having had the nerve removed.  I reminded him that I am not a surgeon and that I cannot remove nerves, but I suggested that while [name] is seeing the neurosurgeon,  that [name] ask if that is something that can be done for him.  The next step in our plan of care is to do either a spinal cord stimulator trial, or an intrathecal pump trial for his lower extremity pain.  Depending on which one works for him, then we will be sending him for permanent implant.  The patient agrees with the plan.  Pharmacotherapy Assessment  Analgesic: Hydrocodone/APAP 5/325 1 tablet every 6 hours (20 mg/day of hydrocodone).  (Unable to tolerate an oxycodone IR trial due to stomachaches, nausea, headaches, and excessive somnolence.  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   Monitoring: [place] PMP: PDMP reviewed during this encounter.       Pharmacotherapy: No side-effects or adverse reactions reported. Compliance: No problems identified. Effectiveness: Clinically acceptable. Plan: Refer to "POC".  UDS:  Summary  Date Value Ref Range Status  07/18/2019 Note  Final    Comment:    ==================================================================== [employer] Select 13 (MW) ==================================================================== Test                             Result       Flag       Units  Drug Present and Declared for Prescription Verification   Hydrocodone                    1468         EXPECTED   ng/mg creat   Hydromorphone  450          EXPECTED   ng/mg creat   Dihydrocodeine                 150          EXPECTED   ng/mg creat   Norhydrocodone                 834          EXPECTED   ng/mg creat    Sources of hydrocodone include scheduled prescription medications.    Hydromorphone, dihydrocodeine and norhydrocodone are expected    metabolites of hydrocodone. Hydromorphone and dihydrocodeine are    also available as scheduled prescription medications.  Drug Present not Declared for Prescription Verification   Oxymorphone                    194          UNEXPECTED ng/mg creat    Sources of oxymorphone include  scheduled prescription medications;    it is also an expected metabolite of oxycodone.  Drug Absent but Declared for Prescription Verification   Morphine                       Not Detected UNEXPECTED ng/mg creat ==================================================================== Test                      Result    Flag   Units      Ref Range   Creatinine              90               mg/dL      >=20 ==================================================================== Declared Medications:  The flagging and interpretation on this report are based on the  following declared medications.  Unexpected results may arise from  inaccuracies in the declared medications.   **Note: The testing scope of this panel includes these medications:   Hydrocodone  Morphine (MSIR)   **Note: The testing scope of this panel does not include the  following reported medications:   Acetaminophen  Aspirin  Cetirizine (Zyrtec)  Hydrochlorothiazide  Hydrocortisone  Isosorbide (Imdur)  Losartan  Methocarbamol  Mometasone  Naloxone (Narcan)  Neomycin  Nitroglycerin (Nitrostat)  Polymyxin B (Polymyxin)  Prednisone  Pregabalin (Lyrica)  Rosuvastatin  Supplement  Tadalafil (Cialis)  Umeclidinium (Anoro)  Vilanterol (Anoro) ==================================================================== For clinical consultation, please call [phone]. ====================================================================     Laboratory Chemistry Profile   Renal Lab Results  Component Value Date   BUN 16 04/01/2019   CREATININE 0.75 04/01/2019   BCR 17 03/19/2019   GFRAA >60 04/01/2019   GFRNONAA >60 04/01/2019     Hepatic Lab Results  Component Value Date   AST 22 08/30/2018   ALT 23 08/30/2018   ALBUMIN 3.8 08/30/2018   ALKPHOS 71 08/30/2018     Electrolytes Lab Results  Component Value Date   NA 138 04/01/2019   K 3.9 04/01/2019   CL 98 04/01/2019   CALCIUM 9.4 04/01/2019   MG 2.0  05/30/2018     Bone Lab Results  Component Value Date   25OHVITD1 32 02/17/2017   25OHVITD2 <1.0 02/17/2017   25OHVITD3 32 02/17/2017   TESTOFREE 5.2 (L) 02/28/2017   TESTOSTERONE 279 02/28/2017     Inflammation (CRP: Acute Phase) (ESR: Chronic Phase) Lab Results  Component Value Date   CRP 8 11/07/2017   ESRSEDRATE 26 11/07/2017  Note: Above Lab results reviewed.   Imaging  MR LUMBAR SPINE WO CONTRAST CLINICAL DATA:  Low back and left leg pain  EXAM: MRI LUMBAR SPINE WITHOUT CONTRAST  TECHNIQUE: Multiplanar, multisequence MR imaging of the lumbar spine was performed. No intravenous contrast was administered.  COMPARISON:  None.  FINDINGS: Segmentation:  Standard.  Alignment:  Anteroposterior alignment is maintained.  Vertebrae: Lumbar vertebral body heights are preserved. No marrow edema. No suspicious osseous lesion.  Conus medullaris and cauda equina: Conus extends to the L1 level. Conus and cauda equina appear normal.  Paraspinal and other soft tissues: Unremarkable.  Disc levels:  T12-L1: Small right paracentral disc protrusion. No canal or foraminal stenosis.  L1-L2:  No canal or foraminal stenosis.  L2-L3:  Minimal disc bulge.  No canal or foraminal stenosis.  L3-L4:  Minimal disc bulge.  No canal or foraminal stenosis.  L4-L5: Disc bulge with endplate osteophytic ridging and facet arthropathy with ligamentum flavum infolding. Mild canal stenosis with slight narrowing of the lateral recesses. Moderate right and mild left foraminal stenosis.  L5-S1: Minimal disc bulge, endplate osteophytic ridging, and facet arthropathy. Prominent epidural fat partially effaces the thecal sac. No canal or right foraminal stenosis. Narrowing of the left lateral recess. Mild to moderate left foraminal stenosis.  IMPRESSION: Multilevel degenerative changes as detailed above. There is no high-grade canal stenosis. There is narrowing of the left  lateral recess and left neural foramen at L5-S1.  Electronically Signed   By: Macy Mis M.D.   On: 07/16/2019 08:21  Assessment  The primary encounter diagnosis was Chronic pain syndrome. 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  Plan of Care  Problem-specific:  No problem-specific Assessment & Plan notes found for this encounter.  Mr. CANE DUBRAY has a current medication list which includes the following long-term medication(s): cetirizine, isosorbide mononitrate, losartan, methocarbamol, nitroglycerin, pregabalin, rosuvastatin, tadalafil, and hydrocodone-acetaminophen.  Pharmacotherapy (Medications Ordered): Meds ordered this encounter  Medications  . HYDROcodone-acetaminophen (NORCO) 7.5-325 MG tablet    Sig: Take 1 tablet by mouth every 6 (six) hours as needed for severe pain. Must last 30 days.    Dispense:  120 tablet    Refill:  0    Chronic Pain: STOP Act (Not applicable) Fill 1 day early if closed on refill date. Do not fill until: 08/01/2019. To last until: 08/31/2019. Avoid benzodiazepines within 8 hours of opioids   Orders:  Orders Placed This Encounter  Procedures  . Ambulatory referral to Neurosurgery    Referral Priority:   Routine    Referral Type:   Surgical    Referral Reason:   Specialty Services Required    Requested Specialty:   Neurosurgery    Number of Visits Requested:   1   Follow-up plan:   Return in about 4 weeks (around 08/29/2019) for VV(21mn), MM (on eval day) to evaluate  change in medicine.  Today we have discontinued his morphine and we will switch him back to the hydrocodone/APAP, but this time to a 7.5/325, instead of the 5/325.     Interventional management options: Considering:   Possible spinal cord stimulator trial  Possible bilateral cervical facet RFA Diagnostic right ankle block Diagnosticright lumbar sympathetic block Diagnostic midlineTESI Diagnostic bilateral thoracic facet block Possible bilateral thoracic facet RFA   Palliative PRN treatment(s):   Palliative left CESI #4 Diagnostic bilateral cervical facet block#3 Palliative/diagnostic right-sidedCommon Peroneal NB#3 Therapeutic right-sided deep peroneal nerve block #2  Palliative right-sided common peroneal nerve RFA #2(last done on 06/21/2017)      Recent Visits Date Type Provider Dept  07/18/19 Office Visit Milinda Pointer, MD [facility]  07/05/19 Office Visit Milinda Pointer, MD [facility]  07/03/19 Procedure visit Milinda Pointer, MD [facility]  06/28/19 Telemedicine Milinda Pointer, MD [facility]  06/19/19 Procedure visit Milinda Pointer, MD [facility]  06/13/19 Telemedicine Milinda Pointer, MD [facility]  05/03/19 Procedure visit Milinda Pointer, MD [facility]  Showing recent visits within past 90 days and meeting all other requirements Today's Visits Date Type Provider Dept  08/01/19 Telemedicine Milinda Pointer, MD [facility]  Showing today's visits and meeting all other requirements Future Appointments Date Type Provider Dept  09/12/19 Appointment Milinda Pointer, MD [facility]  Showing future appointments within next 90 days and meeting all other requirements  I discussed the assessment and treatment plan with the patient. The patient was provided an opportunity to ask questions and all were answered. The patient agreed with the  plan and demonstrated an understanding of the instructions.  Patient advised to call back or seek an in-person evaluation if the symptoms or condition worsens.  Duration of encounter: 18 minutes.  Note by: Gaspar Cola, MD Date: 08/01/2019; Time: 3:42 PM

## 2019-08-01 ENCOUNTER — Ambulatory Visit: Payer: Medicaid Other | Attending: Pain Medicine | Admitting: Pain Medicine

## 2019-08-01 ENCOUNTER — Other Ambulatory Visit: Payer: Self-pay

## 2019-08-01 DIAGNOSIS — M5030 Other cervical disc degeneration, unspecified cervical region: Secondary | ICD-10-CM | POA: Diagnosis not present

## 2019-08-01 DIAGNOSIS — G894 Chronic pain syndrome: Secondary | ICD-10-CM | POA: Diagnosis not present

## 2019-08-01 DIAGNOSIS — M4722 Other spondylosis with radiculopathy, cervical region: Secondary | ICD-10-CM

## 2019-08-01 DIAGNOSIS — M79671 Pain in right foot: Secondary | ICD-10-CM

## 2019-08-01 DIAGNOSIS — M4802 Spinal stenosis, cervical region: Secondary | ICD-10-CM

## 2019-08-01 DIAGNOSIS — G8929 Other chronic pain: Secondary | ICD-10-CM

## 2019-08-01 DIAGNOSIS — M25571 Pain in right ankle and joints of right foot: Secondary | ICD-10-CM | POA: Diagnosis not present

## 2019-08-01 DIAGNOSIS — M542 Cervicalgia: Secondary | ICD-10-CM

## 2019-08-01 DIAGNOSIS — M5412 Radiculopathy, cervical region: Secondary | ICD-10-CM | POA: Diagnosis not present

## 2019-08-01 DIAGNOSIS — Z79899 Other long term (current) drug therapy: Secondary | ICD-10-CM | POA: Diagnosis not present

## 2019-08-01 DIAGNOSIS — R937 Abnormal findings on diagnostic imaging of other parts of musculoskeletal system: Secondary | ICD-10-CM

## 2019-08-01 DIAGNOSIS — M5010 Cervical disc disorder with radiculopathy, unspecified cervical region: Secondary | ICD-10-CM | POA: Diagnosis not present

## 2019-08-01 MED ORDER — HYDROCODONE-ACETAMINOPHEN 7.5-325 MG PO TABS: 1.0000 | ORAL_TABLET | Freq: Four times a day (QID) | ORAL | 0 refills | Status: DC | PRN

## 2019-08-06 ENCOUNTER — Ambulatory Visit: Payer: Medicaid Other | Admitting: Pain Medicine

## 2019-08-07 IMAGING — CR DG CERVICAL SPINE WITH FLEX & EXTEND
8 series · 8 of 8 positions shown · non-contrast
Comparison: None.

CLINICAL DATA: 61-year-old male with chronic neck pain. No known
injury. Initial encounter.

EXAM:
CERVICAL SPINE COMPLETE WITH FLEXION AND EXTENSION VIEWS

[c-spine lat]
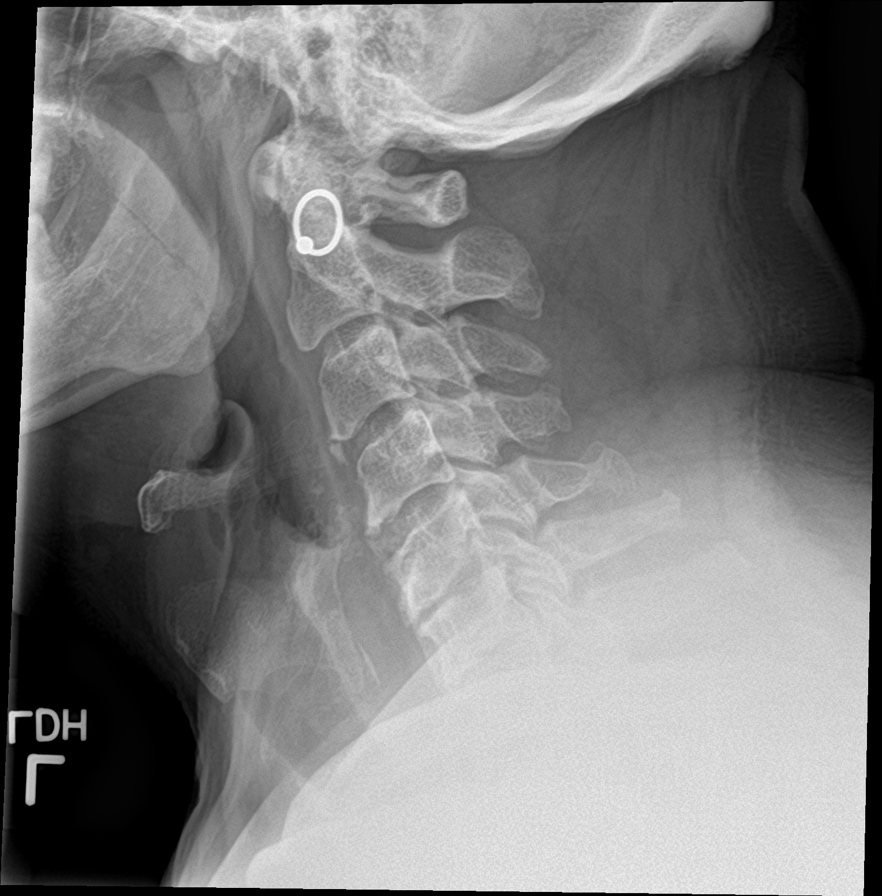

[c-spine flex]
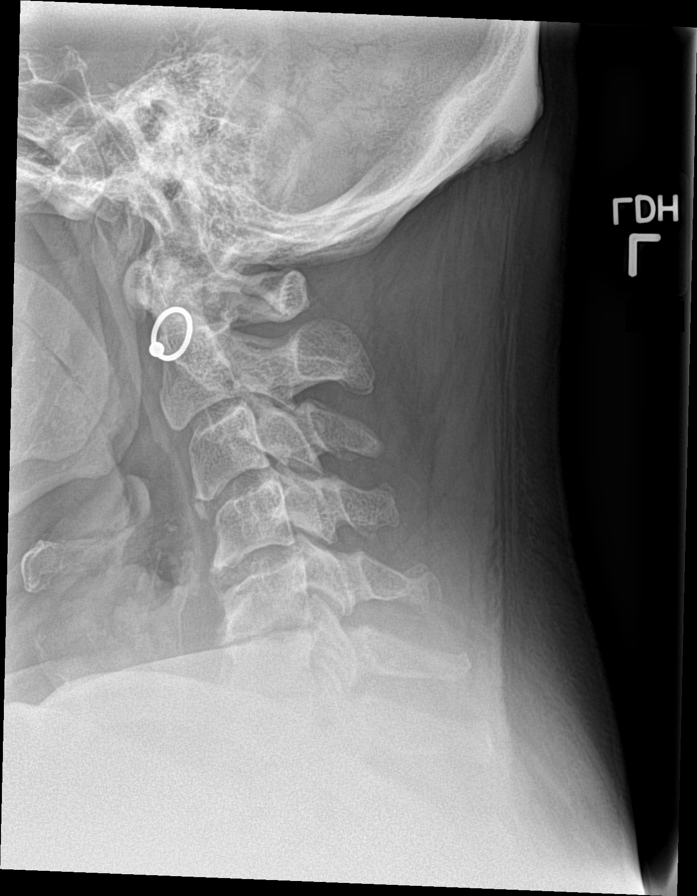

[c-spine ext]
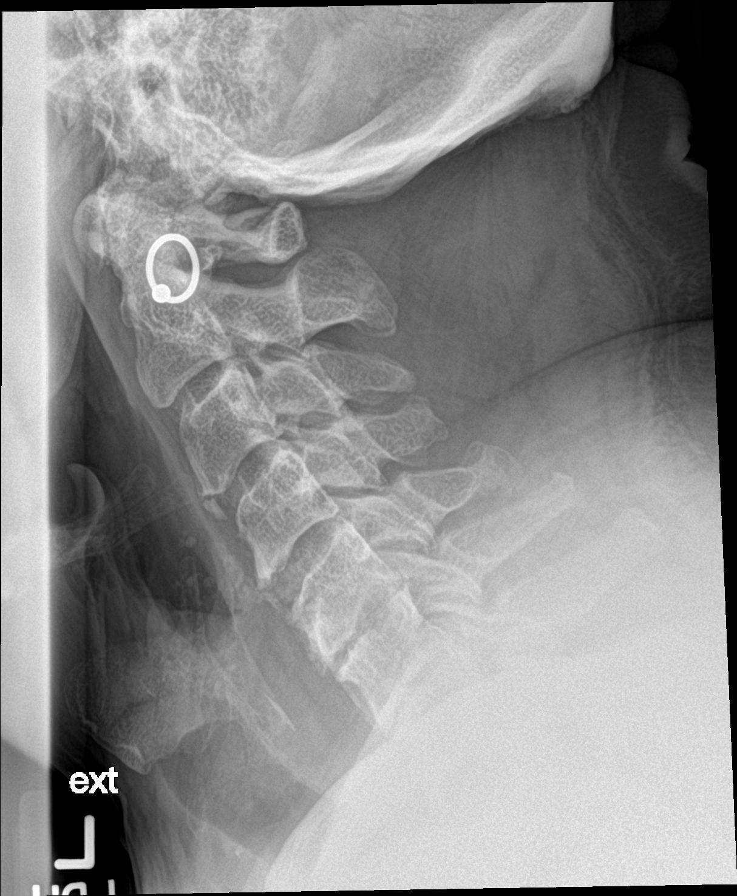

[c-spine obl (1 of 2)]
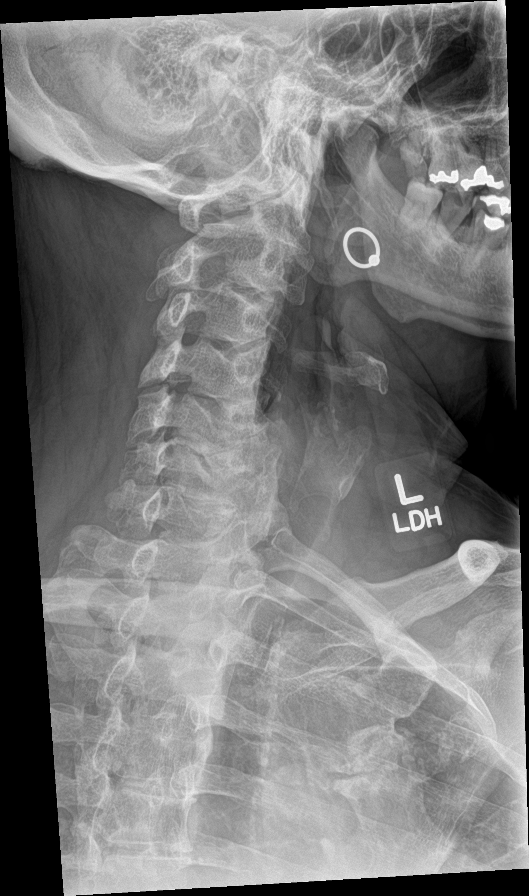

[c-spine obl (2 of 2)]
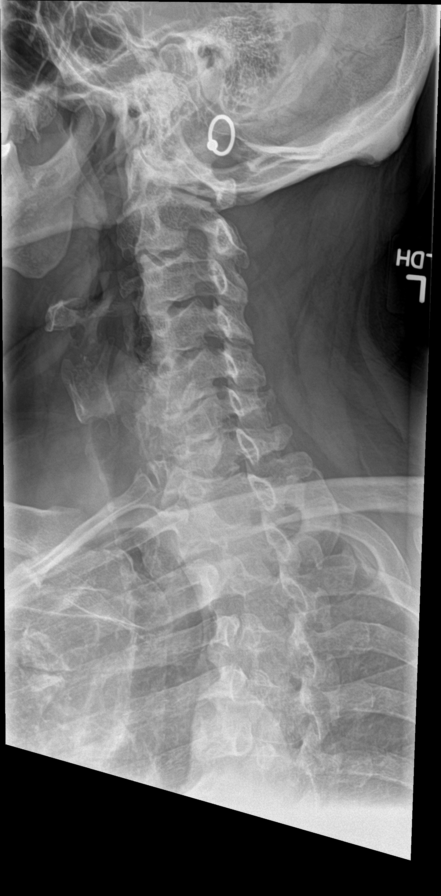

[c-spine ap]
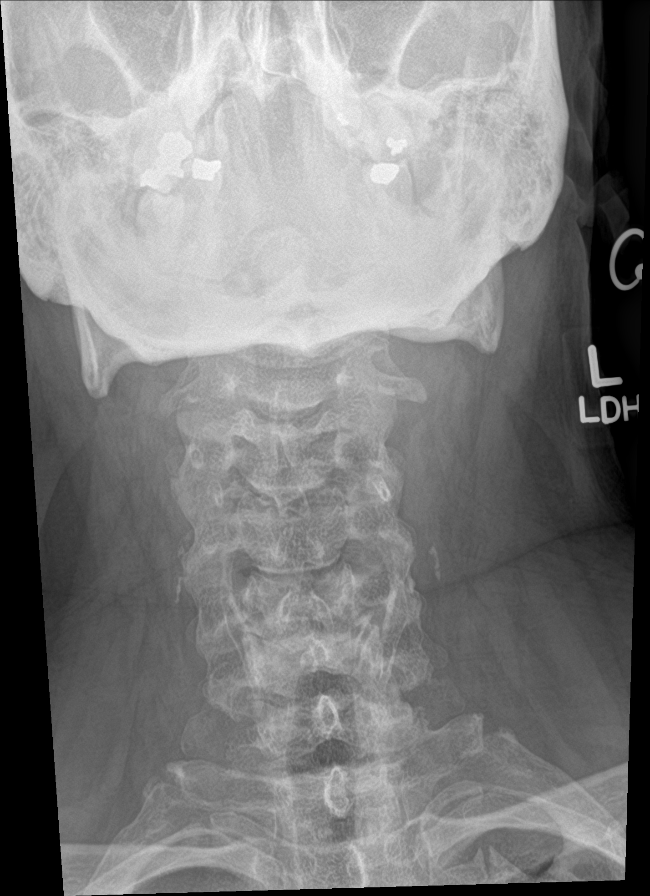

[c-spine open mouth]
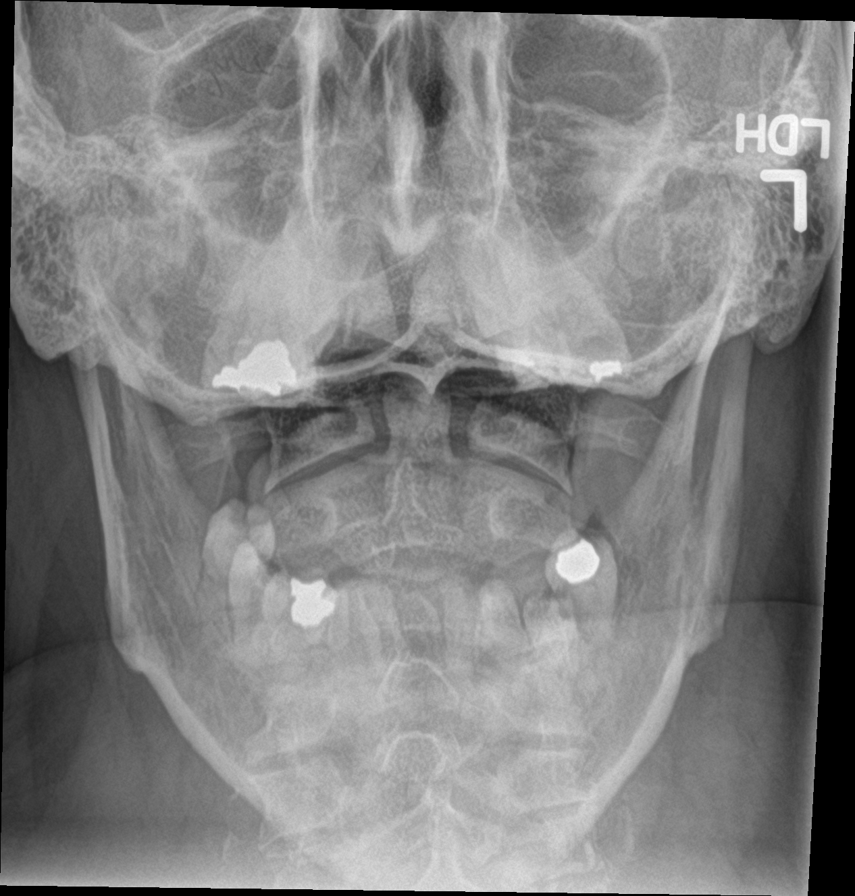

[c-spine swimmers]
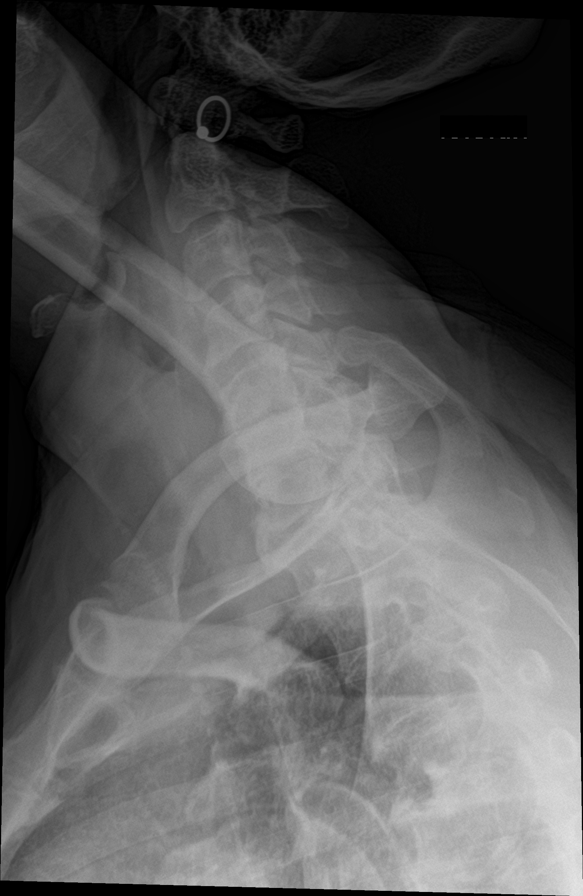

[8 of 8 positions shown; findings below may reference images not displayed]

FINDINGS: Straightening of the cervical spine.

Degenerative changes C5-6, C6-7 and C7-T1 with mild bilateral
foraminal narrowing at each of these levels.

No abnormal motion between flexion and extension.

No fracture or abnormal prevertebral soft tissue swelling.

No lung apical lesion noted.

Bilateral carotid bifurcation calcifications suspected.
IMPRESSION: Straightening of the cervical spine.

Degenerative changes C5-6, C6-7 and C7-T1 with mild bilateral
foraminal narrowing at each of these levels.

No abnormal motion between flexion and extension.

Bilateral carotid bifurcation calcifications suspected.

## 2019-08-13 ENCOUNTER — Ambulatory Visit: Payer: Medicaid Other | Admitting: Family Medicine

## 2019-08-13 ENCOUNTER — Other Ambulatory Visit: Payer: Self-pay

## 2019-08-13 ENCOUNTER — Encounter: Payer: Self-pay | Admitting: Family Medicine

## 2019-08-13 VITALS — BP 85/53 | HR 80 | Temp 98.7°F | Resp 16 | Wt 189.9 lb

## 2019-08-13 DIAGNOSIS — G5791 Unspecified mononeuropathy of right lower limb: Secondary | ICD-10-CM | POA: Diagnosis not present

## 2019-08-13 DIAGNOSIS — S91109A Unspecified open wound of unspecified toe(s) without damage to nail, initial encounter: Secondary | ICD-10-CM

## 2019-08-13 NOTE — Patient Instructions (Addendum)
Please use over the counter antibiotic ointment Bacitracin, and use twice daily after cleaning area. Please keep covered after applying ointment.

## 2019-08-13 NOTE — Progress Notes (Signed)
Established patient visit   Patient: Caleb [name]   DOB: 1955/12/01   64 y.o. Male  MRN: [id number] Visit Date: 08/13/2019  Today's healthcare provider: Lavon Paganini, MD   I,Sulibeya S Dimas,acting as a scribe for Lavon Paganini, MD.,have documented all relevant documentation on the behalf of Lavon Paganini, MD,as directed by  Lavon Paganini, MD while in the presence of Lavon Paganini, MD.  Chief Complaint  Patient presents with  . Toe Pain   Subjective    HPI  Patient here today C/O large toe pain on right foot x 3 weeks. Patient reports pain worse in the last week. Patient reports swelling, and bleeding. Patient reports he has been soaking foot in warm water with Epsom salt. Patient reports big toe being black. 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  Followed by pain management. No h/o PAD  Patient Active Problem List   Diagnosis Date Noted  . DDD (degenerative disc disease), lumbosacral 06/28/2019  . 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 Sacroiliac joint pain (Left) 06/19/2019  . Sacroiliac joint dysfunction (Left) 06/19/2019  . Somatic dysfunction of sacroiliac joint (Left) 06/19/2019  . Acute low back pain (Left) w/o sciatica 06/19/2019  . Cervical paraspinal muscle spasm 01/01/2019  . Trigger point of neck 01/01/2019  . Prediabetes 12/20/2018  . 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 Nondisplaced fracture of proximal phalanx of left thumb with routine healing 06/15/2018  . Bradycardia 06/15/2018  . Neuropathic pain 05/23/2018  . Cough syncope 04/07/2018  . LLQ abdominal pain 03/08/2018  . Chronic neck pain (Bilateral) (L>R) 02/06/2018  . Chronic neck pain with history of cervical spinal surgery 02/06/2018  . Moderate episode of recurrent major depressive disorder ([place]) 01/25/2018  . GAD (generalized anxiety disorder) 01/25/2018  . History of fusion of cervical spine 10/19/2017  . Abnormal  MRI, cervical spine (08/13/2017) 09/08/2017  . Spondylosis without myelopathy or radiculopathy, cervical region 09/07/2017  . Cervical facet syndrome (Bilateral) 09/07/2017  . Abnormal nerve conduction studies (06/08/2017) 06/16/2017  . 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 Chronic upper extremity pain (Left) 05/11/2017  . Cervical spondylosis w/ radiculopathy 05/11/2017  . Cervical disc disorder with radiculopathy of cervical region 05/11/2017  . Chronic upper extremity weakness (Left) 05/11/2017  . Disorder of superficial peroneal nerve (Right) 03/24/2017  . DDD (degenerative disc disease), thoracic 03/02/2017  . DDD (degenerative disc disease), cervical 03/01/2017  . 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 Cervicalgia (4th area of Pain) (Bilateral) (L>R) 03/01/2017  . Elevated C-reactive protein (CRP) 02/28/2017  . Elevated sed rate 02/28/2017  . Plaque psoriasis 02/28/2017  . Spondylosis without myelopathy or radiculopathy, cervicothoracic region 02/28/2017  . Chronic musculoskeletal pain 02/28/2017  . Neurogenic foot pain (Right) 02/28/2017  . Chronic ankle pain (2ry area of Pain) (Right) 02/17/2017  . Chronic thoracic back pain (3ry area of Pain) ([place]) 02/17/2017  . Chronic pain syndrome 02/17/2017  . Long term current use of opiate analgesic 02/17/2017  . Pharmacologic therapy 02/17/2017  . Disorder of skeletal system 02/17/2017  . Problems influencing health status 02/17/2017  . CAD (coronary artery disease), native coronary artery 02/09/2017  . Erectile dysfunction 12/17/2016  . Tobacco abuse 11/18/2016  . Chest pain 10/21/2016  . OSA (obstructive sleep apnea)   . Hypertension   . Hyperlipidemia   . COPD (chronic obstructive pulmonary disease) ([place])   . Myocardial infarction ([place]) 01/04/2013  . Chronic foot pain (  1ry area of Pain) (Right) 01/04/2013   Allergies  Allergen Reactions  . Lisinopril  Cough  . Oxycodone Nausea And Vomiting       Medications: Outpatient Medications Prior to Visit  Medication Sig  . ANORO ELLIPTA 62.5-25 MCG/INH AEPB INHALE 1 PUFF BY MOUTH EVERY DAY  . cetirizine (ZYRTEC) 10 MG tablet TAKE 1 TABLET BY MOUTH EVERY DAY  . fluocinolone (SYNALAR) 0.01 % external solution Apply topically 2 (two) times daily. To ears for psoriasis  . fluticasone (FLONASE) 50 MCG/ACT nasal spray SPRAY 2 SPRAYS INTO EACH NOSTRIL EVERY DAY  . hydrochlorothiazide (HYDRODIURIL) 25 MG tablet Take 25 mg by mouth daily.  Marland Kitchen HYDROcodone-acetaminophen (NORCO) 7.5-325 MG tablet Take 1 tablet by mouth every 6 (six) hours as needed for severe pain. Must last 30 days.  . isosorbide mononitrate (IMDUR) 30 MG 24 hr tablet Take 0.5 tablets (15 mg total) by mouth daily.  Marland Kitchen losartan (COZAAR) 100 MG tablet TAKE 1 TABLET BY MOUTH EVERY DAY  . methocarbamol (ROBAXIN) 750 MG tablet Take 1 tablet (750 mg total) by mouth every 8 (eight) hours as needed for muscle spasms.  . mometasone (ELOCON) 0.1 % lotion APPLY 1 ML ONTO THE SKIN DAILY FOR PSORIASIS  . naloxone (NARCAN) nasal spray 4 mg/0.1 mL Use in case of opioid overdose  . neomycin-polymyxin-hydrocortisone (CORTISPORIN) 3.5-10000-1 OTIC suspension Place 3 drops into the right ear 4 (four) times daily. X 5-7 days  . nitroGLYCERIN (NITROSTAT) 0.4 MG SL tablet Place 1 tablet (0.4 mg total) under the tongue every 5 (five) minutes as needed for chest pain.  . pregabalin (LYRICA) 150 MG capsule Take 1 capsule (150 mg total) by mouth 3 (three) times daily.  . rosuvastatin (CRESTOR) 40 MG tablet TAKE 1 TABLET BY MOUTH EVERY DAY  . tadalafil (CIALIS) 20 MG tablet Take 1 tablet (20 mg total) by mouth daily as needed for erectile dysfunction.  Marland Kitchen aspirin EC 81 MG tablet Take 81 mg by mouth daily. (Patient not taking: Reported on 08/13/2019)  . Nutritional Supplements (KETO PO) Take by mouth daily. (Patient not taking: Reported on 08/13/2019)  . Secukinumab  (COSENTYX [place]) Inject into the skin every 30 (thirty) days.  (Patient not taking: Reported on 08/13/2019)   No facility-administered medications prior to visit.    Review of Systems  Constitutional: Negative for chills, diaphoresis and fever.  Respiratory: Negative for cough, chest tightness and shortness of breath.   Skin: Positive for color change and wound.      Objective    BP (!) 85/53 (BP Location: Left Arm, Patient Position: Sitting, Cuff Size: Normal)   Pulse 80   Temp 98.7 F (37.1 C) (Oral)   Resp 16   Wt 189 lb 14.4 oz (86.1 kg)   BMI 28.87 kg/m    Physical Exam Constitutional:      General: He is not in acute distress.    Appearance: Normal appearance. He is not diaphoretic.  Cardiovascular:     Pulses: Normal pulses.          Dorsalis pedis pulses are 2+ on the right side and 2+ on the left side.  Pulmonary:     Effort: Pulmonary effort is normal. No respiratory distress.  Feet:     Right foot:     Skin integrity: Skin breakdown (R great toe) and erythema present. No ulcer.     Toenail Condition: Right toenails are abnormally thick and long.     Left foot:     Skin  integrity: Skin integrity normal.  Skin:    Findings: No rash.  Neurological:     Mental Status: He is alert and oriented to person, place, and time. Mental status is at baseline.     Sensory: Sensory deficit (R foot at baseline) present.      No results found for any visits on 08/13/19.  Assessment & Plan     1. Open toe wound, initial encounter 2. Neuropathy of right foot - no ulceration, but does have 2 lacerations of R great toe - no signs of infection - complicated by neuropathy - advised to check feet 1-2 times daily - needs to see podiatry regarding onychomycosis -Use bacitracin twice daily -Keep the area clean and dry -Keep the area covered when leaving the house -Return precautions discussed, including signs of infection - Ambulatory referral to Podiatry    Return for as  scheduled.      I, Lavon Paganini, MD, have reviewed all documentation for this visit. The documentation on 08/13/19 for the exam, diagnosis, procedures, and orders are all accurate and complete.   Azha Constantin, Dionne Bucy, MD, MPH [facility]

## 2019-08-17 ENCOUNTER — Encounter: Payer: Self-pay | Admitting: Pain Medicine

## 2019-08-19 ENCOUNTER — Ambulatory Visit: Payer: Medicaid Other

## 2019-08-20 ENCOUNTER — Ambulatory Visit: Payer: Medicaid Other

## 2019-08-20 ENCOUNTER — Other Ambulatory Visit: Payer: Self-pay

## 2019-08-20 ENCOUNTER — Ambulatory Visit: Payer: Medicaid Other | Attending: Pain Medicine | Admitting: Pain Medicine

## 2019-08-20 DIAGNOSIS — M79671 Pain in right foot: Secondary | ICD-10-CM | POA: Diagnosis not present

## 2019-08-20 DIAGNOSIS — M25571 Pain in right ankle and joints of right foot: Secondary | ICD-10-CM | POA: Diagnosis not present

## 2019-08-20 DIAGNOSIS — G894 Chronic pain syndrome: Secondary | ICD-10-CM | POA: Diagnosis not present

## 2019-08-20 DIAGNOSIS — G8929 Other chronic pain: Secondary | ICD-10-CM | POA: Diagnosis not present

## 2019-08-20 DIAGNOSIS — M792 Neuralgia and neuritis, unspecified: Secondary | ICD-10-CM

## 2019-08-20 DIAGNOSIS — S8411XS Injury of peroneal nerve at lower leg level, right leg, sequela: Secondary | ICD-10-CM

## 2019-08-20 NOTE — Progress Notes (Signed)
Patient: Caleb [name]  Service Category: E/M  Provider: Gaspar Cola, MD  DOB: Jul 15, 1955  DOS: 08/20/2019  Location: Office  MRN: [id number]  Setting: Ambulatory outpatient  Referring Provider: Virginia Crews, MD  Type: Established Patient  Specialty: Interventional Pain Management  PCP: Caleb Crews, MD  Location: Remote location  Delivery: TeleHealth     Virtual Encounter - Pain Management PROVIDER NOTE: Information contained herein reflects review and annotations entered in association with encounter. Interpretation of such information and data should be left to medically-trained personnel. Information provided to patient can be located elsewhere in the medical record under "Patient Instructions". Document created using STT-dictation technology, any transcriptional errors that may result from process are unintentional.    Contact & Pharmacy Preferred: [phone] Home: [phone] (home) Mobile: [phone] (mobile) E-mail: [email or web address]  [id number] [address]Phone: [phone]: [phone]  Pre-screening  [name] "in-person" vs "virtual" encounter. He indicated preferring virtual for this encounter.   Reason COVID-19*  Social distancing based on CDC and AMA recommendations.   I contacted Caleb Capronon 08/20/2019 via telephone.      I clearly identified myself as FGaspar Cola MD. I verified that I was speaking with the correct person using two identifiers (Name: Caleb [name] and date of birth: 110-26-1957.  Consent I sought verbal advanced consent from Caleb Capronfor virtual visit interactions. I informed Caleb [name] possible security and privacy concerns, risks, and limitations associated with providing "not-in-person" medical evaluation and management services. I also informed Caleb [name] the availability of "in-person"  appointments. Finally, I informed him that there would be a charge for the virtual visit and that he could be  personally, fully or partially, financially responsible for it. Caleb [name] understanding and agreed to proceed.   Historic Elements   Mr. RGARLAND HINCAPIEis a 64y.o. year old, male patient evaluated today after his last contact with our practice on 07/18/2019. Caleb [name] has a past medical history of Chronic foot pain, right (2015), COPD (chronic obstructive pulmonary disease) ([place], Coronary artery disease, Emphysema lung ([place], Hyperlipidemia, Hypertension, Leucocytosis, Myocardial infarction ([place] (2015), and OSA on CPAP. He also  has a past surgical history that includes Cardiac catheterization (2015); Lumbar laminectomy (1989, 1999); Cervical fusion (1988, 1998); Liver surgery (2015); Partial colectomy (1990); Inguinal hernia repair (Bilateral, 1975); Lithotripsy; and Colonoscopy with propofol (N/A, 02/24/2017). Mr. HChapdelainehas a current medication list which includes the following prescription(s): anoro ellipta, cetirizine, fluocinolone, fluticasone, hydrochlorothiazide, hydrocodone-acetaminophen, isosorbide mononitrate, losartan, methocarbamol, mometasone, naloxone, neomycin-polymyxin-hydrocortisone, nitroglycerin, nutritional supplements, pregabalin, rosuvastatin, tadalafil, aspirin ec, and secukinumab. He  reports that he has been smoking cigarettes. He has a 38.25 pack-year smoking history. He has never used smokeless tobacco. He reports current alcohol use of about 4.0 standard drinks of alcohol per week. He reports that he does not use drugs. Caleb [name] allergic to lisinopril and oxycodone.   HPI  Today, he is being contacted for worsening of previously known (established) problem  Pharmacotherapy Assessment  Analgesic: Hydrocodone/APAP 5/325 1 tablet every 6 hours (20 mg/day of hydrocodone).  (Unable to tolerate an oxycodone IR trial due to stomachaches, nausea, headaches,  and excessive somnolence.  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   Monitoring: [place] PMP: PDMP reviewed during this encounter.       Pharmacotherapy: No side-effects or adverse reactions reported.  Compliance: No problems identified. Effectiveness: Clinically acceptable. Plan: Refer to "POC".  UDS:  Summary  Date Value Ref Range Status  07/18/2019 Note  Final    Comment:    ==================================================================== [employer] Select 13 (MW) ==================================================================== Test                             Result       Flag       Units  Drug Present and Declared for Prescription Verification   Hydrocodone                    1468         EXPECTED   ng/mg creat   Hydromorphone                  450          EXPECTED   ng/mg creat   Dihydrocodeine                 150          EXPECTED   ng/mg creat   Norhydrocodone                 834          EXPECTED   ng/mg creat    Sources of hydrocodone include scheduled prescription medications.    Hydromorphone, dihydrocodeine and norhydrocodone are expected    metabolites of hydrocodone. Hydromorphone and dihydrocodeine are    also available as scheduled prescription medications.  Drug Present not Declared for Prescription Verification   Oxymorphone                    194          UNEXPECTED ng/mg creat    Sources of oxymorphone include scheduled prescription medications;    it is also an expected metabolite of oxycodone.  Drug Absent but Declared for Prescription Verification   Morphine                       Not Detected UNEXPECTED ng/mg creat ==================================================================== Test                      Result    Flag   Units      Ref Range   Creatinine              90               mg/dL      >=20 ==================================================================== Declared Medications:  The flagging and  interpretation on this report are based on the  following declared medications.  Unexpected results may arise from  inaccuracies in the declared medications.   **Note: The testing scope of this panel includes these medications:   Hydrocodone  Morphine (MSIR)   **Note: The testing scope of this panel does not include the  following reported medications:   Acetaminophen  Aspirin  Cetirizine (Zyrtec)  Hydrochlorothiazide  Hydrocortisone  Isosorbide (Imdur)  Losartan  Methocarbamol  Mometasone  Naloxone (Narcan)  Neomycin  Nitroglycerin (Nitrostat)  Polymyxin B (Polymyxin)  Prednisone  Pregabalin (Lyrica)  Rosuvastatin  Supplement  Tadalafil (Cialis)  Umeclidinium (Anoro)  Vilanterol (Anoro) ==================================================================== For clinical consultation, please call [phone]. ====================================================================     Laboratory Chemistry Profile   Renal Lab Results  Component Value Date   BUN 16 04/01/2019   CREATININE 0.75 04/01/2019  BCR 17 03/19/2019   GFRAA >60 04/01/2019   GFRNONAA >60 04/01/2019     Hepatic Lab Results  Component Value Date   AST 22 08/30/2018   ALT 23 08/30/2018   ALBUMIN 3.8 08/30/2018   ALKPHOS 71 08/30/2018     Electrolytes Lab Results  Component Value Date   NA 138 04/01/2019   K 3.9 04/01/2019   CL 98 04/01/2019   CALCIUM 9.4 04/01/2019   MG 2.0 05/30/2018     Bone Lab Results  Component Value Date   25OHVITD1 32 02/17/2017   25OHVITD2 <1.0 02/17/2017   25OHVITD3 32 02/17/2017   TESTOFREE 5.2 (L) 02/28/2017   TESTOSTERONE 279 02/28/2017     Inflammation (CRP: Acute Phase) (ESR: Chronic Phase) Lab Results  Component Value Date   CRP 8 11/07/2017   ESRSEDRATE 26 11/07/2017       Note: Above Lab results reviewed.  Imaging  MR LUMBAR SPINE WO CONTRAST CLINICAL DATA:  Low back and left leg pain  EXAM: MRI LUMBAR SPINE WITHOUT  CONTRAST  TECHNIQUE: Multiplanar, multisequence MR imaging of the lumbar spine was performed. No intravenous contrast was administered.  COMPARISON:  None.  FINDINGS: Segmentation:  Standard.  Alignment:  Anteroposterior alignment is maintained.  Vertebrae: Lumbar vertebral body heights are preserved. No marrow edema. No suspicious osseous lesion.  Conus medullaris and cauda equina: Conus extends to the L1 level. Conus and cauda equina appear normal.  Paraspinal and other soft tissues: Unremarkable.  Disc levels:  T12-L1: Small right paracentral disc protrusion. No canal or foraminal stenosis.  L1-L2:  No canal or foraminal stenosis.  L2-L3:  Minimal disc bulge.  No canal or foraminal stenosis.  L3-L4:  Minimal disc bulge.  No canal or foraminal stenosis.  L4-L5: Disc bulge with endplate osteophytic ridging and facet arthropathy with ligamentum flavum infolding. Mild canal stenosis with slight narrowing of the lateral recesses. Moderate right and mild left foraminal stenosis.  L5-S1: Minimal disc bulge, endplate osteophytic ridging, and facet arthropathy. Prominent epidural fat partially effaces the thecal sac. No canal or right foraminal stenosis. Narrowing of the left lateral recess. Mild to moderate left foraminal stenosis.  IMPRESSION: Multilevel degenerative changes as detailed above. There is no high-grade canal stenosis. There is narrowing of the left lateral recess and left neural foramen at L5-S1.  Electronically Signed   By: Macy Mis M.D.   On: 07/16/2019 08:21  Assessment  The primary encounter diagnosis was Chronic pain syndrome. 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  Plan of Care  Problem-specific:  No problem-specific Assessment & Plan notes found for this  encounter.  Caleb [name] has a current medication list which includes the following long-term medication(s): cetirizine, hydrocodone-acetaminophen, isosorbide mononitrate, losartan, methocarbamol, nitroglycerin, pregabalin, rosuvastatin, and tadalafil.  Pharmacotherapy (Medications Ordered): No orders of the defined types were placed in this encounter.  Orders:  Orders Placed This Encounter  Procedures  . Misc procedure    Standing Status:   Future    Standing Expiration Date:   10/20/2019    Scheduling Instructions:     Type of Block:  Palliative/diagnostic right-sidedCommon Peroneal NB#3     Side: Right-sided     Sedation: With Sedation.     Timeframe: ASAA   Follow-up plan:   Return for Procedure (w/ sedation): (R) Common Peroneal NB#3.  Interventional management options: Considering:   Possible spinal cord stimulator trial  Possible bilateral cervical facet RFA Diagnostic right ankle block Diagnosticright lumbar sympathetic block Diagnostic midlineTESI Diagnostic bilateral thoracic facet block Possible bilateral thoracic facet RFA   Palliative PRN treatment(s):   Palliative left CESI #4 Diagnostic bilateral cervical facet block#3 Palliative/diagnostic right-sidedCommon Peroneal NB#3 Therapeutic right-sided deep peroneal nerve block #2  Palliative right-sided common peroneal nerve RFA #2(last done on 06/21/2017)       Recent Visits Date Type Provider Dept  08/01/19 Telemedicine Milinda Pointer, [facility]  07/18/19 Office Visit Milinda Pointer, MD [facility]  07/05/19 Office Visit Milinda Pointer, MD [facility]  07/03/19 Procedure visit Milinda Pointer, MD [facility]  06/28/19 Telemedicine Milinda Pointer, MD [facility]  06/19/19 Procedure visit Milinda Pointer, MD [facility]  06/13/19 Telemedicine Milinda Pointer, MD [facility]   Showing recent visits within past 90 days and meeting all other requirements Today's Visits Date Type Provider Dept  08/20/19 Telemedicine Milinda Pointer, MD [facility]  Showing today's visits and meeting all other requirements Future Appointments Date Type Provider Dept  09/12/19 Appointment Milinda Pointer, MD [facility]  Showing future appointments within next 90 days and meeting all other requirements  I discussed the assessment and treatment plan with the patient. The patient was provided an opportunity to ask questions and all were answered. The patient agreed with the plan and demonstrated an understanding of the instructions.  Patient advised to call back or seek an in-person evaluation if the symptoms or condition worsens.  Duration of encounter: 15 minutes.  Note by: Caleb Cola, MD Date: 08/20/2019; Time: 10:16 AM

## 2019-08-20 NOTE — Patient Instructions (Signed)
____________________________________________________________________________________________  Preparing for Procedure with Sedation  Procedure appointments are limited to planned procedures: . No Prescription Refills. . No disability issues will be discussed. . No medication changes will be discussed.  Instructions: . Oral Intake: Do not eat or drink anything for at least 8 hours prior to your procedure. (Exception: Blood Pressure Medication. See below.) . Transportation: Unless otherwise stated by your physician, you may drive yourself after the procedure. . Blood Pressure Medicine: Do not forget to take your blood pressure medicine with a sip of water the morning of the procedure. If your Diastolic (lower reading)is above 100 mmHg, elective cases will be cancelled/rescheduled. . Blood thinners: These will need to be stopped for procedures. Notify our staff if you are taking any blood thinners. Depending on which one you take, there will be specific instructions on how and when to stop it. . Diabetics on insulin: Notify the staff so that you can be scheduled 1st case in the morning. If your diabetes requires high dose insulin, take only  of your normal insulin dose the morning of the procedure and notify the staff that you have done so. . Preventing infections: Shower with an antibacterial soap the morning of your procedure. . Build-up your immune system: Take 1000 mg of Vitamin C with every meal (3 times a day) the day prior to your procedure. . Antibiotics: Inform the staff if you have a condition or reason that requires you to take antibiotics before dental procedures. . Pregnancy: If you are pregnant, call and cancel the procedure. . Sickness: If you have a cold, fever, or any active infections, call and cancel the procedure. . Arrival: You must be in the facility at least 30 minutes prior to your scheduled procedure. . Children: Do not bring children with you. . Dress appropriately:  Bring dark clothing that you would not mind if they get stained. . Valuables: Do not bring any jewelry or valuables.  Reasons to call and reschedule or cancel your procedure: (Following these recommendations will minimize the risk of a serious complication.) . Surgeries: Avoid having procedures within 2 weeks of any surgery. (Avoid for 2 weeks before or after any surgery). . Flu Shots: Avoid having procedures within 2 weeks of a flu shots or . (Avoid for 2 weeks before or after immunizations). . Barium: Avoid having a procedure within 7-10 days after having had a radiological study involving the use of radiological contrast. (Myelograms, Barium swallow or enema study). . Heart attacks: Avoid any elective procedures or surgeries for the initial 6 months after a "Myocardial Infarction" (Heart Attack). . Blood thinners: It is imperative that you stop these medications before procedures. Let us know if you if you take any blood thinner.  . Infection: Avoid procedures during or within two weeks of an infection (including chest colds or gastrointestinal problems). Symptoms associated with infections include: Localized redness, fever, chills, night sweats or profuse sweating, burning sensation when voiding, cough, congestion, stuffiness, runny nose, sore throat, diarrhea, nausea, vomiting, cold or Flu symptoms, recent or current infections. It is specially important if the infection is over the area that we intend to treat. . Heart and lung problems: Symptoms that may suggest an active cardiopulmonary problem include: cough, chest pain, breathing difficulties or shortness of breath, dizziness, ankle swelling, uncontrolled high or unusually low blood pressure, and/or palpitations. If you are experiencing any of these symptoms, cancel your procedure and contact your primary care physician for an evaluation.  Remember:  Regular Business hours are:    Monday to Thursday 8:00 AM to 4:00 PM  Provider's  Schedule: Tere Mcconaughey, MD:  Procedure days: Tuesday and Thursday 7:30 AM to 4:00 PM  Bilal Lateef, MD:  Procedure days: Monday and Wednesday 7:30 AM to 4:00 PM ____________________________________________________________________________________________    

## 2019-08-27 ENCOUNTER — Ambulatory Visit: Payer: Medicaid Other | Admitting: Podiatry

## 2019-08-27 ENCOUNTER — Encounter: Payer: Self-pay | Admitting: Podiatry

## 2019-08-27 ENCOUNTER — Other Ambulatory Visit: Payer: Self-pay

## 2019-08-27 ENCOUNTER — Ambulatory Visit: Payer: Medicaid Other

## 2019-08-27 DIAGNOSIS — L97511 Non-pressure chronic ulcer of other part of right foot limited to breakdown of skin: Secondary | ICD-10-CM

## 2019-08-27 DIAGNOSIS — R200 Anesthesia of skin: Secondary | ICD-10-CM | POA: Diagnosis not present

## 2019-08-27 DIAGNOSIS — R202 Paresthesia of skin: Secondary | ICD-10-CM | POA: Diagnosis not present

## 2019-08-27 MED ORDER — LIDOCAINE 5 % EX PTCH: 1.0000 | MEDICATED_PATCH | CUTANEOUS | 1 refills | Status: DC

## 2019-08-28 ENCOUNTER — Telehealth: Payer: Self-pay | Admitting: Pain Medicine

## 2019-08-28 NOTE — Telephone Encounter (Signed)
Spoke with patient and he is having tremendous foot pain.  At his last visit on 08/20/19 a peroneal nerve block #3 was ordered and no one has contacted patient to schedule as that was a telemedicine visit.  Patient is taking hydrocodone - apap 7.5-325 mg.  States that Dr Dossie Arbour had mentioned patches but did not call anything in and there is no record in the chart of such.  I told Audry Pili I would check on it and get back with him.

## 2019-08-28 NOTE — Telephone Encounter (Signed)
Patient calling to see if the pain patches have been sent to [facility]? Was supposed to be for the pain in his foot. Also lot of swelling. Please let patient know this.
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
## 2019-08-29 NOTE — Telephone Encounter (Signed)
Additional message sent to [facility] about scheduling.

## 2019-08-31 ENCOUNTER — Telehealth: Payer: Self-pay

## 2019-08-31 MED ORDER — DOXYCYCLINE HYCLATE 100 MG PO TABS: 100.0000 mg | ORAL_TABLET | Freq: Two times a day (BID) | ORAL | 0 refills | Status: DC

## 2019-08-31 MED ORDER — TRAMADOL HCL 50 MG PO TABS: 50.0000 mg | ORAL_TABLET | Freq: Three times a day (TID) | ORAL | 0 refills | Status: DC | PRN

## 2019-08-31 NOTE — Telephone Encounter (Signed)
Patient called stated that his toe is throbbing and kept him awake last night.  He says its still red and not better.  He denied any fever/chills.  He wanted to know if you could send in something for pain and maybe an antibiotic.  Please advise

## 2019-08-31 NOTE — Telephone Encounter (Signed)
I will send antiobitcs in. I can send some tramadol in but he is on norco from the PCP.

## 2019-08-31 NOTE — Telephone Encounter (Signed)
Patient has been notified of medication and encouraged to go to Urgent care or ED if symptoms worsen.  He verbalized understanding

## 2019-09-02 ENCOUNTER — Other Ambulatory Visit: Payer: Self-pay | Admitting: Family Medicine

## 2019-09-03 ENCOUNTER — Telehealth: Payer: Self-pay

## 2019-09-03 ENCOUNTER — Telehealth: Payer: Self-pay | Admitting: *Deleted

## 2019-09-03 ENCOUNTER — Telehealth: Payer: Self-pay | Admitting: Pain Medicine

## 2019-09-03 NOTE — Telephone Encounter (Signed)
Per Dr. Holley Raring, have patient come in tomorrow for appt, in person.

## 2019-09-03 NOTE — Telephone Encounter (Signed)
Spoke with patient.  He was unaware that Dr Dossie Arbour had only sent in one month prescription in July for the Hydrocodone 7.5/325 mg due to it being an increase from 5/325 mg.  The orders states to follow up in 1 month for a med refill, but  the last appointment was scheduled as a new complaint and the medication was not discussed, therefore it was not ordered and the patient was out of his pain medication and Lyrica yesterday.  Please advise

## 2019-09-03 NOTE — Telephone Encounter (Signed)
Patient lvmail 09-02-19 stating his pharmacy is telling him they do not have scripts for Lyrica or his pain meds. Please check on scripts and call pharmacy and patient to let him know status.

## 2019-09-04 ENCOUNTER — Other Ambulatory Visit: Payer: Self-pay

## 2019-09-04 ENCOUNTER — Encounter: Payer: Self-pay | Admitting: Student in an Organized Health Care Education/Training Program

## 2019-09-04 ENCOUNTER — Ambulatory Visit
Payer: Medicaid Other | Attending: Student in an Organized Health Care Education/Training Program | Admitting: Student in an Organized Health Care Education/Training Program

## 2019-09-04 VITALS — BP 109/71 | HR 90 | Temp 97.9°F | Resp 18 | Ht 68.0 in | Wt 198.0 lb

## 2019-09-04 DIAGNOSIS — M25571 Pain in right ankle and joints of right foot: Secondary | ICD-10-CM | POA: Diagnosis not present

## 2019-09-04 DIAGNOSIS — G8929 Other chronic pain: Secondary | ICD-10-CM | POA: Insufficient documentation

## 2019-09-04 DIAGNOSIS — Z79899 Other long term (current) drug therapy: Secondary | ICD-10-CM | POA: Diagnosis not present

## 2019-09-04 DIAGNOSIS — G894 Chronic pain syndrome: Secondary | ICD-10-CM

## 2019-09-04 DIAGNOSIS — M5010 Cervical disc disorder with radiculopathy, unspecified cervical region: Secondary | ICD-10-CM | POA: Diagnosis not present

## 2019-09-04 DIAGNOSIS — M792 Neuralgia and neuritis, unspecified: Secondary | ICD-10-CM | POA: Diagnosis not present

## 2019-09-04 DIAGNOSIS — M79671 Pain in right foot: Secondary | ICD-10-CM | POA: Diagnosis not present

## 2019-09-04 DIAGNOSIS — R937 Abnormal findings on diagnostic imaging of other parts of musculoskeletal system: Secondary | ICD-10-CM | POA: Diagnosis not present

## 2019-09-04 DIAGNOSIS — S8411XS Injury of peroneal nerve at lower leg level, right leg, sequela: Secondary | ICD-10-CM | POA: Insufficient documentation

## 2019-09-04 MED ORDER — PREGABALIN 150 MG PO CAPS: 150.0000 mg | ORAL_CAPSULE | Freq: Three times a day (TID) | ORAL | 5 refills | Status: DC

## 2019-09-04 MED ORDER — HYDROCODONE-ACETAMINOPHEN 7.5-325 MG PO TABS: 1.0000 | ORAL_TABLET | Freq: Four times a day (QID) | ORAL | 0 refills | Status: DC | PRN
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
## 2019-09-04 NOTE — Progress Notes (Signed)
Nursing Pain Medication Assessment:  Safety precautions to be maintained throughout the outpatient stay will include: orient to surroundings, keep bed in low position, maintain call bell within reach at all times, provide assistance with transfer out of bed and ambulation.  Medication Inspection Compliance: Caleb [name] did not comply with our request to bring his pills to be counted. He was reminded that bringing the medication bottles, even when empty, is a requirement.  Medication: None brought in. Pill/Patch Count: None available to be counted. Bottle Appearance: No container available. Did not bring bottle(s) to appointment. Filled Date: N/A Last Medication intake:  Day before yesterday

## 2019-09-05 ENCOUNTER — Other Ambulatory Visit: Payer: Self-pay | Admitting: Family Medicine

## 2019-09-05 ENCOUNTER — Ambulatory Visit: Payer: Medicaid Other | Admitting: Dermatology

## 2019-09-05 NOTE — Telephone Encounter (Signed)
appt scheduled in 2 weeks

## 2019-09-11 DIAGNOSIS — G894 Chronic pain syndrome: Secondary | ICD-10-CM | POA: Diagnosis not present

## 2019-09-12 ENCOUNTER — Ambulatory Visit: Payer: Medicaid Other | Admitting: Pain Medicine

## 2019-09-12 NOTE — Progress Notes (Signed)
PROVIDER NOTE: Information contained herein reflects review and annotations entered in association with encounter. Interpretation of such information and data should be left to medically-trained personnel. Information provided to patient can be located elsewhere in the medical record under "Patient Instructions". Document created using STT-dictation technology, any transcriptional errors that may result from process are unintentional.    Patient: Caleb [name]  Service Category: Procedure  Provider: Gaspar Cola, MD  DOB: 05-27-1955  DOS: 09/13/2019  Location: [facility]  MRN: [id number]  Setting: Ambulatory - outpatient  Referring Provider: Virginia Crews, MD  Type: Established Patient  Specialty: Interventional Pain Management  PCP: Virginia Crews, MD   Primary Reason for Visit: Interventional Pain Management Treatment. CC: Toe Pain (right)  Procedure:          Anesthesia, Analgesia, Anxiolysis:  Type: Diagnostic Superficial Peroneal/Fibular (S-PN)           Nerve Block          Region: Anterolateral distal leg Level: Distal lower extremity. Laterality: Right-Side  Type: Moderate (Conscious) Sedation combined with Local Anesthesia Indication(s): Analgesia and Anxiety Local Anesthetic: Lidocaine 1-2% Route: Intravenous (IV) IV Access: Secured Sedation: Meaningful verbal contact was maintained at all times during the procedure   Position: Supine   Indications: 1. Disorder of superficial peroneal nerve (Right)   2. 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 (year old), male patient, seen today for interventional treatment. He  has a past surgical history that includes Cardiac catheterization (2015); Lumbar laminectomy (1989, 1999); Cervical fusion (1988, 1998); Liver surgery (2015); Partial colectomy (1990); Inguinal hernia repair (Bilateral,  1975); Lithotripsy; and Colonoscopy with propofol (N/A, 02/24/2017). Caleb [name] has a current medication list which includes the following prescription(s): anoro ellipta, aspirin ec, cetirizine, fluocinolone, fluticasone, hydrochlorothiazide, [START ON 10/04/2019] hydrocodone-acetaminophen, [START ON 11/03/2019] hydrocodone-acetaminophen, [START ON 12/03/2019] hydrocodone-acetaminophen, isosorbide mononitrate, lidocaine, losartan, methocarbamol, mometasone, naloxone, neomycin-polymyxin-hydrocortisone, nitroglycerin, nutritional supplements, pregabalin, rosuvastatin, tadalafil, and doxycycline, and the following Facility-Administered Medications: fentanyl and midazolam. His primarily concern today is the Toe Pain (right)  Initial Vital Signs:  Pulse/HCG Rate: 82ECG Heart Rate: 72 Temp: (!) 97.5 F (36.4 C) Resp: 19 BP: 120/74 SpO2: 99 %  BMI: Estimated body mass index is 29.5 kg/m as calculated from the following:   Height as of this encounter: 5\' 8"  (1.727 m).   Weight as of this encounter: 194 lb (88 kg).  Risk Assessment: Allergies: Reviewed. He is allergic to lisinopril and oxycodone.  Allergy Precautions: None required Coagulopathies: Reviewed. None identified.  Blood-thinner therapy: None at this time Active Infection(s): Reviewed. None identified. Caleb [name] is afebrile  Site Confirmation: Caleb [name] was asked to confirm the procedure and laterality before marking the site Procedure checklist: Completed Consent: Before the procedure and under the influence of no sedative(s), amnesic(s), or anxiolytics, the patient was informed of the treatment options, risks and possible complications. To fulfill our ethical and legal obligations, as recommended by the American Medical Association's Code of Ethics, I have informed the patient of my clinical impression; the nature and purpose of the treatment or procedure; the risks, benefits, and possible complications of the intervention; the  alternatives, including doing nothing; the risk(s) and benefit(s) of the alternative treatment(s) or procedure(s); and the risk(s) and benefit(s) of doing nothing. The patient was provided information about the general risks and possible complications associated with the procedure.  These may include, but are not limited to: failure to achieve desired goals, infection, bleeding, organ or nerve damage, allergic reactions, paralysis, and death. In addition, the patient was informed of those risks and complications associated to the procedure, such as failure to decrease pain; infection; bleeding; organ or nerve damage with subsequent damage to sensory, motor, and/or autonomic systems, resulting in permanent pain, numbness, and/or weakness of one or several areas of the body; allergic reactions; (i.e.: anaphylactic reaction); and/or death. Furthermore, the patient was informed of those risks and complications associated with the medications. These include, but are not limited to: allergic reactions (i.e.: anaphylactic or anaphylactoid reaction(s)); adrenal axis suppression; blood sugar elevation that in diabetics may result in ketoacidosis or comma; water retention that in patients with history of congestive heart failure may result in shortness of breath, pulmonary edema, and decompensation with resultant heart failure; weight gain; swelling or edema; medication-induced neural toxicity; particulate matter embolism and blood vessel occlusion with resultant organ, and/or nervous system infarction; and/or aseptic necrosis of one or more joints. Finally, the patient was informed that Medicine is not an exact science; therefore, there is also the possibility of unforeseen or unpredictable risks and/or possible complications that may result in a catastrophic outcome. The patient indicated having understood very clearly. We have given the patient no guarantees and we have made no promises. Enough time was given to the  patient to ask questions, all of which were answered to the patient's satisfaction. Caleb [name] has indicated that he wanted to continue with the procedure. Attestation: I, the ordering provider, attest that I have discussed with the patient the benefits, risks, side-effects, alternatives, likelihood of achieving goals, and potential problems during recovery for the procedure that I have provided informed consent. Date  Time: 09/13/2019  8:49 AM  Pre-Procedure Preparation:  Monitoring: As per clinic protocol. Respiration, ETCO2, SpO2, BP, heart rate and rhythm monitor placed and checked for adequate function Safety Precautions: Patient was assessed for positional comfort and pressure points before starting the procedure. Time-out: I initiated and conducted the "Time-out" before starting the procedure, as per protocol. The patient was asked to participate by confirming the accuracy of the "Time Out" information. Verification of the correct person, site, and procedure were performed and confirmed by me, the nursing staff, and the patient. "Time-out" conducted as per Joint Commission's Universal Protocol (UP.01.01.01). Time: 7253  Description of Procedure:          Target Area: For the superficial peroneal [fibular] nerve, the target area is the lateral aspect of the distal lower extremity, above the lateral malleolus, as the nerve exits the crural fascia and the lateral compartment of the leg. Approach: Percutaneous approach. Area Prepped: Entire distal anterolateral lower extremity region DuraPrep (Iodine Povacrylex [0.7% available iodine] and Isopropyl Alcohol, 74% w/w) Safety Precautions: Aspiration looking for blood return was conducted prior to all injections. At no point did we inject any substances, as a needle was being advanced. No attempts were made at seeking any paresthesias. Safe injection practices and needle disposal techniques used. Medications properly checked for expiration dates. SDV  (single dose vial) medications used. Description of the Procedure: Protocol guidelines were followed. The patient was placed in position. The target area was identified and the area prepped in the usual manner. Skin & deeper tissues infiltrated with local anesthetic. Appropriate amount of time allowed to pass for local anesthetics to take effect. The procedure needles were then advanced to the target area. Proper needle placement secured. Negative aspiration  confirmed. Solution injected in intermittent fashion, asking for systemic symptoms every 0.5cc of injectate. The needles were then removed and the area cleansed, making sure to leave some of the prepping solution back to take advantage of its long term bactericidal properties.         Vitals:   09/13/19 0955 09/13/19 1004 09/13/19 1014 09/13/19 1024  BP: 124/88 114/70 102/77 116/72  Pulse: 68     Resp: 18 16 18 14   Temp:  97.6 F (36.4 C)  97.7 F (36.5 C)  TempSrc:  Temporal  Temporal  SpO2: 99% 97% 94% 98%  Weight:      Height:        Start Time: 0947 hrs. End Time: 0953 hrs.  Materials:  Needle(s) Type: Spinal Needle Gauge: 22G Length: 3.5-in Medication(s): Please see orders for medications and dosing details.  Imaging Guidance (Non-Spinal):          Type of Imaging Technique: Fluoroscopy Guidance (Non-Spinal) Indication(s): Assistance in needle guidance and placement for procedures requiring needle placement in or near specific anatomical locations not easily accessible without such assistance. Exposure Time: Please see nurses notes. Contrast: None used. Fluoroscopic Guidance: I was personally present during the use of fluoroscopy. "Tunnel Vision Technique" used to obtain the best possible view of the target area. Parallax error corrected before commencing the procedure. "Direction-depth-direction" technique used to introduce the needle under continuous pulsed fluoroscopy. Once target was reached, antero-posterior, oblique,  and lateral fluoroscopic projection used confirm needle placement in all planes. Images permanently stored in EMR. Interpretation: No contrast injected. I personally interpreted the imaging intraoperatively. Adequate needle placement confirmed in multiple planes. Permanent images saved into the patient's record.  Antibiotic Prophylaxis:   Anti-infectives (From admission, onward)   None     Indication(s): None identified  Post-operative Assessment:  Post-procedure Vital Signs:  Pulse/HCG Rate: 6869 Temp: 97.7 F (36.5 C) Resp: 14 BP: 116/72 SpO2: 98 %  EBL: None  Complications: No immediate post-treatment complications observed by team, or reported by patient.  Note: The patient tolerated the entire procedure well. A repeat set of vitals were taken after the procedure and the patient was kept under observation following institutional policy, for this type of procedure. Post-procedural neurological assessment was performed, showing return to baseline, prior to discharge. The patient was provided with post-procedure discharge instructions, including a section on how to identify potential problems. Should any problems arise concerning this procedure, the patient was given instructions to immediately contact us, at any time, without hesitation. In any case, we plan to contact the patient by telephone for a follow-up status report regarding this interventional procedure.  Comments:  While the patient was in the recovery area, he did experience some PVCs that were asymptomatic.  They resolved with O2 and asking the patient to take a couple deep breath.  Plan of Care  Orders:  Orders Placed This Encounter  Procedures  . Misc procedure    Scheduling Instructions:     Type of Block:  Peroneal NB     Side: Right-sided     Sedation: With Sedation.     Timeframe: Today  . [facility] C-ARM 1-60 MIN NO REPORT    Intraoperative interpretation by procedural physician at [place].    Standing Status:   Standing    Number of Occurrences:   1    Order Specific Question:   Reason for exam:    Answer:   Assistance in needle guidance and placement for  procedures requiring needle placement in or near specific anatomical locations not easily accessible without such assistance.  . Informed Consent Details: Physician/Practitioner Attestation; Transcribe to consent form and obtain patient signature    Provider Attestation: I, Matlock Dossie Arbour, MD, (Pain Management Specialist), the physician/practitioner, attest that I have discussed with the patient the benefits, risks, side effects, alternatives, likelihood of achieving goals and potential problems during recovery for the procedure that I have provided informed consent.    Scheduling Instructions:     Procedure: Right peroneal NB     Indication/Reason: Right lower extremity pain     Note: Always confirm laterality of pain with Caleb [name], before procedure.  . Provide equipment / supplies at bedside    Equipment required: Single use, disposable, "Block Tray"    Standing Status:   Standing    Number of Occurrences:   1    Order Specific Question:   Specify    Answer:   Block Tray   Chronic Opioid Analgesic:  Hydrocodone/APAP 5/325 1 tablet every 6 hours (20 mg/day of hydrocodone).  (Unable to tolerate an oxycodone IR trial due to stomachaches, nausea, headaches, and excessive somnolence.  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   Medications ordered for procedure: Meds ordered this encounter  Medications  . lidocaine (XYLOCAINE) 2 % (with pres) injection 400 mg  . lactated ringers infusion 1,000 mL  . midazolam (VERSED) 5 MG/5ML injection 1-2 mg    Make sure Flumazenil is available in the pyxis when using this medication. If oversedation occurs, administer 0.2 mg IV over 15 sec. If after 45 sec no response, administer 0.2 mg again over 1 min; may repeat at 1 min intervals; not  to exceed 4 doses (1 mg)  . fentaNYL (SUBLIMAZE) injection 25-50 mcg    Make sure Narcan is available in the pyxis when using this medication. In the event of respiratory depression (RR< 8/min): Titrate NARCAN (naloxone) in increments of 0.1 to 0.2 mg IV at 2-3 minute intervals, until desired degree of reversal.  . dexamethasone (DECADRON) injection 10 mg  . triamcinolone acetonide (KENALOG-40) injection 40 mg  . ropivacaine (PF) 2 mg/mL (0.2%) (NAROPIN) injection 9 mL  . HYDROcodone-acetaminophen (NORCO) 7.5-325 MG tablet    Sig: Take 1 tablet by mouth every 6 (six) hours as needed for severe pain. Must last 30 days.    Dispense:  120 tablet    Refill:  0    Chronic Pain: STOP Act (Not applicable) Fill 1 day early if closed on refill date. Avoid benzodiazepines within 8 hours of opioids  . HYDROcodone-acetaminophen (NORCO) 7.5-325 MG tablet    Sig: Take 1 tablet by mouth every 6 (six) hours as needed for severe pain. Must last 30 days.    Dispense:  120 tablet    Refill:  0    Chronic Pain: STOP Act (Not applicable) Fill 1 day early if closed on refill date. Avoid benzodiazepines within 8 hours of opioids  . HYDROcodone-acetaminophen (NORCO) 7.5-325 MG tablet    Sig: Take 1 tablet by mouth every 6 (six) hours as needed for severe pain. Must last 30 days.    Dispense:  120 tablet    Refill:  0    Chronic Pain: STOP Act (Not applicable) Fill 1 day early if closed on refill date. Avoid benzodiazepines within 8 hours of opioids   Medications administered: We administered lidocaine, lactated ringers, midazolam, fentaNYL, dexamethasone, triamcinolone acetonide, and ropivacaine (PF) 2 mg/mL (0.2%).  See the medical record for exact dosing, route, and time of administration.  Follow-up plan:   Return in about 2 weeks (around 09/27/2019) for (VV), (PP) Follow-up.       Interventional management options: Considering:   Possible spinal cord stimulator trial  Possible bilateral cervical facet  RFA Diagnostic right ankle block Diagnosticright lumbar sympathetic block Diagnostic midlineTESI Diagnostic bilateral thoracic facet block Possible bilateral thoracic facet RFA   Palliative PRN treatment(s):   Palliative left CESI #4 Diagnostic bilateral cervical facet block#3 Palliative/diagnostic right-sidedCommon Peroneal NB#3 Therapeutic right-sided deep peroneal nerve block #2  Palliative right-sided common peroneal nerve RFA #2(last done on 06/21/2017)        Recent Visits Date Type Provider Dept  09/04/19 Office Visit Gillis Santa, MD [facility]  08/20/19 Telemedicine Milinda Pointer, MD [facility]  08/01/19 Telemedicine Milinda Pointer, [facility]  07/18/19 Office Visit Milinda Pointer, MD [facility]  07/05/19 Office Visit Milinda Pointer, MD [facility]  07/03/19 Procedure visit Milinda Pointer, MD [facility]  06/28/19 Telemedicine Milinda Pointer, MD [facility]  06/19/19 Procedure visit Milinda Pointer, MD [facility]  Showing recent visits within past 90 days and meeting all other requirements Today's Visits Date Type Provider Dept  09/13/19 Procedure visit Milinda Pointer, MD [facility]  Showing today's visits and meeting all other requirements Future Appointments Date Type Provider Dept  10/09/19 Appointment Milinda Pointer, MD [facility]  Showing future appointments within next 90 days and meeting all other requirements  Disposition: Discharge home  Discharge (Date  Time): 09/13/2019; 1028 hrs.   Primary Care Physician: Virginia Crews, MD Location: [facility] Note by: Gaspar Cola, MD Date: 09/13/2019; Time: 11:09 AM  Disclaimer:  Medicine is not an Chief Strategy Officer. The only guarantee in medicine is that nothing is guaranteed. It is important to note that the decision  to proceed with this intervention was based on the information collected from the patient. The Data and conclusions were drawn from the patient's questionnaire, the interview, and the physical examination. Because the information was provided in large part by the patient, it cannot be guaranteed that it has not been purposely or unconsciously manipulated. Every effort has been made to obtain as much relevant data as possible for this evaluation. It is important to note that the conclusions that lead to this procedure are derived in large part from the available data. Always take into account that the treatment will also be dependent on availability of resources and existing treatment guidelines, considered by other Pain Management Practitioners as being common knowledge and practice, at the time of the intervention. For Medico-Legal purposes, it is also important to point out that variation in procedural techniques and pharmacological choices are the acceptable norm. The indications, contraindications, technique, and results of the above procedure should only be interpreted and judged by a Board-Certified Interventional Pain Specialist with extensive familiarity and expertise in the same exact procedure and technique.

## 2019-09-12 NOTE — Patient Instructions (Signed)

## 2019-09-13 ENCOUNTER — Ambulatory Visit (HOSPITAL_BASED_OUTPATIENT_CLINIC_OR_DEPARTMENT_OTHER): Payer: Medicaid Other | Admitting: Pain Medicine

## 2019-09-13 ENCOUNTER — Encounter: Payer: Self-pay | Admitting: Pain Medicine

## 2019-09-13 ENCOUNTER — Other Ambulatory Visit: Payer: Self-pay

## 2019-09-13 ENCOUNTER — Ambulatory Visit
Admission: RE | Admit: 2019-09-13 | Discharge: 2019-09-13 | Disposition: A | Discharge status: Home / Self Care | Payer: Medicaid Other | Source: Ambulatory Visit | Attending: Pain Medicine | Admitting: Pain Medicine

## 2019-09-13 VITALS — BP 116/72 | HR 68 | Temp 97.7°F | Resp 14 | Ht 68.0 in | Wt 194.0 lb

## 2019-09-13 DIAGNOSIS — G894 Chronic pain syndrome: Secondary | ICD-10-CM | POA: Diagnosis not present

## 2019-09-13 DIAGNOSIS — S8411XS Injury of peroneal nerve at lower leg level, right leg, sequela: Secondary | ICD-10-CM | POA: Insufficient documentation

## 2019-09-13 DIAGNOSIS — G5731 Lesion of lateral popliteal nerve, right lower limb: Secondary | ICD-10-CM | POA: Diagnosis not present

## 2019-09-13 LAB — TOXASSURE SELECT 13 (MW), URINE

## 2019-09-13 MED ORDER — HYDROCODONE-ACETAMINOPHEN 7.5-325 MG PO TABS: 1.0000 | ORAL_TABLET | Freq: Four times a day (QID) | ORAL | 0 refills | Status: DC | PRN

## 2019-09-13 MED ORDER — LACTATED RINGERS IV SOLN
1000.0000 mL | Freq: Once | INTRAVENOUS | Status: AC
  Administered 2019-09-13: 1000 mL via INTRAVENOUS

## 2019-09-13 MED ORDER — TRIAMCINOLONE ACETONIDE 40 MG/ML IJ SUSP
INTRAMUSCULAR | Status: AC
  Filled 2019-09-13: qty 1

## 2019-09-13 MED ORDER — MIDAZOLAM HCL 5 MG/5ML IJ SOLN
1.0000 mg | INTRAMUSCULAR | Status: DC | PRN
  Administered 2019-09-13 (×2): 2 mg via INTRAVENOUS

## 2019-09-13 MED ORDER — FENTANYL CITRATE (PF) 100 MCG/2ML IJ SOLN
25.0000 ug | INTRAMUSCULAR | Status: DC | PRN
  Administered 2019-09-13: 50 ug via INTRAVENOUS

## 2019-09-13 MED ORDER — ROPIVACAINE HCL 2 MG/ML IJ SOLN
9.0000 mL | Freq: Once | INTRAMUSCULAR | Status: AC
  Administered 2019-09-13: 9 mL via PERINEURAL

## 2019-09-13 MED ORDER — TRIAMCINOLONE ACETONIDE 40 MG/ML IJ SUSP
40.0000 mg | Freq: Once | INTRAMUSCULAR | Status: AC
  Administered 2019-09-13: 40 mg

## 2019-09-13 MED ORDER — ROPIVACAINE HCL 2 MG/ML IJ SOLN
INTRAMUSCULAR | Status: AC
  Filled 2019-09-13: qty 10

## 2019-09-13 MED ORDER — FENTANYL CITRATE (PF) 100 MCG/2ML IJ SOLN
INTRAMUSCULAR | Status: AC
  Filled 2019-09-13: qty 2

## 2019-09-13 MED ORDER — DEXAMETHASONE SODIUM PHOSPHATE 10 MG/ML IJ SOLN
INTRAMUSCULAR | Status: AC
  Filled 2019-09-13: qty 1

## 2019-09-13 MED ORDER — DEXAMETHASONE SODIUM PHOSPHATE 10 MG/ML IJ SOLN
10.0000 mg | Freq: Once | INTRAMUSCULAR | Status: AC
  Administered 2019-09-13: 10 mg

## 2019-09-13 MED ORDER — LIDOCAINE HCL 2 % IJ SOLN
20.0000 mL | Freq: Once | INTRAMUSCULAR | Status: AC
  Administered 2019-09-13: 400 mg
  Filled 2019-09-13: qty 10

## 2019-09-13 MED ORDER — MIDAZOLAM HCL 5 MG/5ML IJ SOLN
INTRAMUSCULAR | Status: AC
  Filled 2019-09-13: qty 5

## 2019-09-13 NOTE — Progress Notes (Addendum)
Safety precautions to be maintained throughout the outpatient stay will include: orient to surroundings, keep bed in low position, maintain call bell within reach at all times, provide assistance with transfer out of bed and ambulation.  Patient states he needs a med refill.  Dr Dossie Arbour notified.

## 2019-09-14 ENCOUNTER — Telehealth: Payer: Self-pay

## 2019-09-14 NOTE — Telephone Encounter (Signed)
Post procedure phone call.  Patient states he is doing ok.  

## 2019-09-18 ENCOUNTER — Ambulatory Visit: Payer: Medicaid Other | Admitting: Podiatry

## 2019-09-18 ENCOUNTER — Other Ambulatory Visit: Payer: Self-pay

## 2019-09-18 ENCOUNTER — Ambulatory Visit (INDEPENDENT_AMBULATORY_CARE_PROVIDER_SITE_OTHER): Payer: Medicaid Other

## 2019-09-18 ENCOUNTER — Encounter: Payer: Self-pay | Admitting: Podiatry

## 2019-09-18 DIAGNOSIS — M7751 Other enthesopathy of right foot: Secondary | ICD-10-CM

## 2019-09-18 DIAGNOSIS — L97511 Non-pressure chronic ulcer of other part of right foot limited to breakdown of skin: Secondary | ICD-10-CM
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
## 2019-09-19 ENCOUNTER — Ambulatory Visit: Payer: Medicaid Other | Admitting: Dermatology

## 2019-09-19 NOTE — Progress Notes (Signed)
Established patient visit  I,Sulibeya S Dimas,acting as a scribe for Lavon Paganini, MD.,have documented all relevant documentation on the behalf of Lavon Paganini, MD,as directed by  Lavon Paganini, MD while in the presence of Lavon Paganini, MD.   Patient: Caleb [name]   DOB: 07-Jan-1955   64 y.o. Male  MRN: [id number] Visit Date: 09/20/2019  Today's healthcare provider: Lavon Paganini, MD   Chief Complaint  Patient presents with  . Anxiety  . Depression  . Hypertension  . Prediabetes   Subjective    HPI  Hypertension, follow-up  BP Readings from Last 3 Encounters:  09/20/19 104/67  09/13/19 116/72  09/04/19 109/71   Wt Readings from Last 3 Encounters:  09/20/19 188 lb 3.2 oz (85.4 kg)  09/13/19 194 lb (88 kg)  09/04/19 198 lb (89.8 kg)     He was last seen for hypertension 6 months ago.  BP at that visit was 131/83. Management since that visit includes no changes.  He reports excellent compliance with treatment. He is not having side effects.  He is following a Low Sodium diet. He is not exercising. He does smoke.  Use of agents associated with hypertension: none.   Outside blood pressures are not being checked. Symptoms: No chest pain No chest pressure  No palpitations No syncope  No dyspnea No orthopnea  No paroxysmal nocturnal dyspnea Yes lower extremity edema   Pertinent labs: Lab Results  Component Value Date   CHOL 171 12/18/2018   HDL 67 12/18/2018   LDLCALC 71 12/18/2018   TRIG 203 (H) 12/18/2018   CHOLHDL 2.6 12/18/2018   Lab Results  Component Value Date   NA 138 04/01/2019   K 3.9 04/01/2019   CREATININE 0.75 04/01/2019   GFRNONAA >60 04/01/2019   GFRAA >60 04/01/2019   GLUCOSE 92 04/01/2019     The ASCVD Risk score (Goff DC Jr., et al., 2013) failed to calculate for the following reasons:   The patient has a prior MI or stroke diagnosis    --------------------------------------------------------------------------------------------------- Depression,/Anxiety Follow-up  He  was last seen for this 6 months ago. Changes made at last visit include no changes.   He reports excellent compliance with treatment. He is not having side effects.   He reports excellent tolerance of treatment. Current symptoms include: none He feels he is Improved since last visit.  Depression screen [facility] 2/9 09/20/2019 09/13/2019 09/04/2019  Decreased Interest 0 0 0  Down, Depressed, Hopeless 0 0 0  PHQ - 2 Score 0 0 0  Altered sleeping 0 - -  Tired, decreased energy 0 - -  Change in appetite 0 - -  Feeling bad or failure about yourself  0 - -  Trouble concentrating 0 - -  Moving slowly or fidgety/restless 0 - -  Suicidal thoughts 0 - -  PHQ-9 Score 0 - -  Difficult doing work/chores Not difficult at all - -  Some recent data might be hidden    ----------------------------------------------------------------------------------------- Prediabetes, Follow-up  Lab Results  Component Value Date   HGBA1C 5.7 (A) 09/20/2019   HGBA1C 5.8 (H) 12/18/2018   GLUCOSE 92 04/01/2019   GLUCOSE 88 03/19/2019   GLUCOSE 114 (H) 08/30/2018    Last seen for for this9 months ago.  Management since that visit includes no changes. Current symptoms include foot ulcerations and have been improving.  Prior visit with dietician: no Current diet: in general, a "healthy" diet   Current exercise: none  Pertinent Labs:  Component Value Date/Time   CHOL 171 12/18/2018 1448   TRIG 203 (H) 12/18/2018 1448   CHOLHDL 2.6 12/18/2018 1448   CHOLHDL 3.6 10/21/2016 1636   CREATININE 0.75 04/01/2019 1652   CREATININE 0.98 10/21/2016 1636    Wt Readings from Last 3 Encounters:  09/20/19 188 lb 3.2 oz (85.4 kg)  09/13/19 194 lb (88 kg)  09/04/19 198 lb (89.8 kg)     ----------------------------------------------------------------------------------------- Follow up for toe ulcer  The patient was last seen for this 1 months ago with Dr. Brita Romp.  Changes made at last visit include referral to Podiatry.  He reports excellent compliance with treatment. He feels that condition is Improved. He is not having side effects.   -----------------------------------------------------------------------------------------      Patient Active Problem List   Diagnosis Date Noted  . DDD (degenerative disc disease), lumbosacral 06/28/2019  . 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 Sacroiliac joint pain (Left) 06/19/2019  . Sacroiliac joint dysfunction (Left) 06/19/2019  . Somatic dysfunction of sacroiliac joint (Left) 06/19/2019  . Acute low back pain (Left) w/o sciatica 06/19/2019  . Cervical paraspinal muscle spasm 01/01/2019  . Trigger point of neck 01/01/2019  . Prediabetes 12/20/2018  . 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 Nondisplaced fracture of proximal phalanx of left thumb with routine healing 06/15/2018  . Bradycardia 06/15/2018  . Neuropathic pain 05/23/2018  . Cough syncope 04/07/2018  . LLQ abdominal pain 03/08/2018  . Chronic neck pain (Bilateral) (L>R) 02/06/2018  . Chronic neck pain with history of cervical spinal surgery 02/06/2018  . Moderate episode of recurrent major depressive disorder ([place]) 01/25/2018  . GAD (generalized anxiety disorder) 01/25/2018  . History of fusion of cervical spine 10/19/2017  . Abnormal MRI, cervical spine (08/13/2017) 09/08/2017  . Spondylosis without myelopathy or radiculopathy, cervical region 09/07/2017  . Cervical facet syndrome (Bilateral) 09/07/2017  . Abnormal nerve conduction studies (06/08/2017) 06/16/2017  . 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 Chronic upper extremity pain (Left) 05/11/2017  . Cervical spondylosis w/ radiculopathy  05/11/2017  . Cervical disc disorder with radiculopathy of cervical region 05/11/2017  . Chronic upper extremity weakness (Left) 05/11/2017  . Disorder of superficial peroneal nerve (Right) 03/24/2017  . DDD (degenerative disc disease), thoracic 03/02/2017  . DDD (degenerative disc disease), cervical 03/01/2017  . 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 Cervicalgia (4th area of Pain) (Bilateral) (L>R) 03/01/2017  . Elevated C-reactive protein (CRP) 02/28/2017  . Elevated sed rate 02/28/2017  . Plaque psoriasis 02/28/2017  . Spondylosis without myelopathy or radiculopathy, cervicothoracic region 02/28/2017  . Chronic musculoskeletal pain 02/28/2017  . Neurogenic foot pain (Right) 02/28/2017  . Chronic ankle pain (2ry area of Pain) (Right) 02/17/2017  . Chronic thoracic back pain (3ry area of Pain) ([place]) 02/17/2017  . Chronic pain syndrome 02/17/2017  . Long term current use of opiate analgesic 02/17/2017  . Pharmacologic therapy 02/17/2017  . Disorder of skeletal system 02/17/2017  . Problems influencing health status 02/17/2017  . CAD (coronary artery disease), native coronary artery 02/09/2017  . Erectile dysfunction 12/17/2016  . Tobacco abuse 11/18/2016  . Chest pain 10/21/2016  . OSA (obstructive sleep apnea)   . Hypertension   . Hyperlipidemia   . COPD (chronic obstructive pulmonary disease) ([place])   . Myocardial infarction ([place]) 01/04/2013  . Chronic foot pain (1ry area of Pain) (Right) 01/04/2013   Social History   Tobacco Use  . Smoking status: Current Every Day Smoker  Packs/day: 0.75    Years: 51.00    Pack years: 38.25    Types: Cigarettes  . Smokeless tobacco: Never Used  . Tobacco comment: 1 ppd 03/21/19//lmr  Vaping Use  . Vaping Use: Never used  Substance Use Topics  . Alcohol use: Yes    Alcohol/week: 4.0 standard drinks    Types: 4 Cans of beer per week    Comment: weekly  . Drug use: No   Allergies   Allergen Reactions  . Lisinopril Cough  . Oxycodone Nausea And Vomiting       Medications: Outpatient Medications Prior to Visit  Medication Sig  . ANORO ELLIPTA 62.5-25 MCG/INH AEPB INHALE 1 PUFF BY MOUTH EVERY DAY  . aspirin EC 81 MG tablet Take 81 mg by mouth daily.   . cetirizine (ZYRTEC) 10 MG tablet TAKE 1 TABLET BY MOUTH EVERY DAY  . fluocinolone (SYNALAR) 0.01 % external solution Apply topically 2 (two) times daily. To ears for psoriasis  . fluticasone (FLONASE) 50 MCG/ACT nasal spray SPRAY 2 SPRAYS INTO EACH NOSTRIL EVERY DAY  . hydrochlorothiazide (HYDRODIURIL) 25 MG tablet Take 25 mg by mouth daily.  Derrill Memo ON 10/04/2019] HYDROcodone-acetaminophen (NORCO) 7.5-325 MG tablet Take 1 tablet by mouth every 6 (six) hours as needed for severe pain. Must last 30 days.  Derrill Memo ON 11/03/2019] HYDROcodone-acetaminophen (NORCO) 7.5-325 MG tablet Take 1 tablet by mouth every 6 (six) hours as needed for severe pain. Must last 30 days.  Derrill Memo ON 12/03/2019] HYDROcodone-acetaminophen (NORCO) 7.5-325 MG tablet Take 1 tablet by mouth every 6 (six) hours as needed for severe pain. Must last 30 days.  . isosorbide mononitrate (IMDUR) 30 MG 24 hr tablet TAKE 0.5 TABLETS (15 MG TOTAL) BY MOUTH DAILY.  Marland Kitchen losartan (COZAAR) 100 MG tablet TAKE 1 TABLET BY MOUTH EVERY DAY  . methocarbamol (ROBAXIN) 750 MG tablet Take 1 tablet (750 mg total) by mouth every 8 (eight) hours as needed for muscle spasms.  . naloxone (NARCAN) nasal spray 4 mg/0.1 mL Use in case of opioid overdose  . neomycin-polymyxin-hydrocortisone (CORTISPORIN) 3.5-10000-1 OTIC suspension Place 3 drops into the right ear 4 (four) times daily. X 5-7 days  . nitroGLYCERIN (NITROSTAT) 0.4 MG SL tablet Place 1 tablet (0.4 mg total) under the tongue every 5 (five) minutes as needed for chest pain.  . Nutritional Supplements (KETO PO) Take by mouth daily.   . pregabalin (LYRICA) 150 MG capsule Take 1 capsule (150 mg total) by mouth 3 (three)  times daily.  . rosuvastatin (CRESTOR) 40 MG tablet TAKE 1 TABLET BY MOUTH EVERY DAY  . tadalafil (CIALIS) 20 MG tablet Take 1 tablet (20 mg total) by mouth daily as needed for erectile dysfunction.  Marland Kitchen doxycycline (VIBRA-TABS) 100 MG tablet Take 1 tablet (100 mg total) by mouth 2 (two) times daily. (Patient not taking: Reported on 09/20/2019)  . lidocaine (LIDODERM) 5 % Place 1 patch onto the skin daily. Remove & Discard patch within 12 hours or as directed by MD (Patient not taking: Reported on 09/20/2019)  . mometasone (ELOCON) 0.1 % lotion APPLY 1 ML ONTO THE SKIN DAILY FOR PSORIASIS (Patient not taking: Reported on 09/20/2019)   No facility-administered medications prior to visit.    Review of Systems  Constitutional: Negative for appetite change, chills and fatigue.  Respiratory: Negative for cough, chest tightness, shortness of breath and wheezing.   Cardiovascular: Negative for chest pain and palpitations.  Skin: Positive for wound.    Last CBC Lab Results  Component Value Date   WBC 14.1 (H) 04/01/2019   HGB 15.8 04/01/2019   HCT 47.9 04/01/2019   MCV 95.2 04/01/2019   MCH 31.4 04/01/2019   RDW 12.6 04/01/2019   PLT 234 04/01/2019      Objective    BP 104/67 (BP Location: Left Arm, Patient Position: Sitting, Cuff Size: Normal)   Pulse 71   Temp 98.7 F (37.1 C) (Oral)   Resp 16   Ht 5\' 8"  (1.727 m)   Wt 188 lb 3.2 oz (85.4 kg)   SpO2 99%   BMI 28.62 kg/m  BP Readings from Last 3 Encounters:  09/20/19 104/67  09/13/19 116/72  09/04/19 109/71   Wt Readings from Last 3 Encounters:  09/20/19 188 lb 3.2 oz (85.4 kg)  09/13/19 194 lb (88 kg)  09/04/19 198 lb (89.8 kg)      Physical Exam Vitals reviewed.  HENT:     Head: Normocephalic and atraumatic.  Cardiovascular:     Rate and Rhythm: Normal rate and regular rhythm.     Pulses: Normal pulses.     Heart sounds: Normal heart sounds.  Pulmonary:     Effort: Pulmonary effort is normal.  Neurological:      General: No focal deficit present.     Mental Status: He is alert and oriented to person, place, and time. Mental status is at baseline.  Psychiatric:        Mood and Affect: Mood normal.        Behavior: Behavior normal.      Results for orders placed or performed in visit on 09/20/19  POCT glycosylated hemoglobin (Hb A1C)  Result Value Ref Range   Hemoglobin A1C 5.7 (A) 4.0 - 5.6 %   Est. average glucose Bld gHb Est-mCnc 117     Assessment & Plan     Problem List Items Addressed This Visit      Cardiovascular and Mediastinum   Hypertension - Primary    Well controlled BP today 104/67 Continue current medications Recheck metabolic panel F/u in 6 months       Relevant Orders   Comprehensive metabolic panel     Nervous and Auditory   Neuropathy of right foot    Persistent pain, no improvement on Lyrica Will start Nortriptyline 10 mg daily Will advise Dr. Dossie Arbour of medication changes May increase if needed Follow up in 6 months      Relevant Medications   nortriptyline (PAMELOR) 10 MG capsule     Other   Hyperlipidemia    Previously well controlled Continue statin Repeat FLP and CMP Goal LDL < 70      Relevant Orders   Lipid Panel With LDL/HDL Ratio   Tobacco abuse    3-5 minute discussion regarding the harms of tobacco use, the benefits of cessation, and methods of cessation Discussed that there are medication options to help with cessation Patient reports he will try vaping to help quit smoking Will reassess at next visit       Moderate episode of recurrent major depressive disorder ([place])    Well controlled  No oral medication  PHQ score 0 Continue to monitor Follow up in 6 months      Relevant Medications   nortriptyline (PAMELOR) 10 MG capsule   GAD (generalized anxiety disorder)    Well controlled No oral medication needed at this time Recheck GAD 7 at next visit Follow up in 6 months      Relevant Medications  nortriptyline  (PAMELOR) 10 MG capsule   Prediabetes    Well controlled with A1c 5.7 Continue working on healthy lifestyle changes F/u in 6 months       Relevant Orders   POCT glycosylated hemoglobin (Hb A1C) (Completed)       Return in about 6 months (around 03/19/2020) for CPE, chronic disease f/u.      I, Lavon Paganini, MD, have reviewed all documentation for this visit. The documentation on 09/20/19 for the exam, diagnosis, procedures, and orders are all accurate and complete.   Ireanna Finlayson, Dionne Bucy, MD, MPH [facility]

## 2019-09-20 ENCOUNTER — Ambulatory Visit: Payer: Medicaid Other | Admitting: Family Medicine

## 2019-09-20 ENCOUNTER — Encounter: Payer: Self-pay | Admitting: Family Medicine

## 2019-09-20 ENCOUNTER — Other Ambulatory Visit: Payer: Self-pay

## 2019-09-20 VITALS — BP 104/67 | HR 71 | Temp 98.7°F | Resp 16 | Ht 68.0 in | Wt 188.2 lb

## 2019-09-20 DIAGNOSIS — F411 Generalized anxiety disorder: Secondary | ICD-10-CM | POA: Diagnosis not present

## 2019-09-20 DIAGNOSIS — F331 Major depressive disorder, recurrent, moderate: Secondary | ICD-10-CM | POA: Diagnosis not present

## 2019-09-20 DIAGNOSIS — R7303 Prediabetes: Secondary | ICD-10-CM

## 2019-09-20 DIAGNOSIS — Z720 Tobacco use: Secondary | ICD-10-CM

## 2019-09-20 DIAGNOSIS — I10 Essential (primary) hypertension: Secondary | ICD-10-CM | POA: Diagnosis not present

## 2019-09-20 DIAGNOSIS — E7800 Pure hypercholesterolemia, unspecified: Secondary | ICD-10-CM

## 2019-09-20 DIAGNOSIS — G5791 Unspecified mononeuropathy of right lower limb: Secondary | ICD-10-CM | POA: Insufficient documentation

## 2019-09-20 LAB — POCT GLYCOSYLATED HEMOGLOBIN (HGB A1C)
Est. average glucose Bld gHb Est-mCnc: 117
Hemoglobin A1C: 5.7 % — AB (ref 4.0–5.6)

## 2019-09-20 MED ORDER — NORTRIPTYLINE HCL 10 MG PO CAPS: 10.0000 mg | ORAL_CAPSULE | Freq: Every day | ORAL | 3 refills | Status: DC

## 2019-09-20 NOTE — Assessment & Plan Note (Signed)
Previously well controlled Continue statin Repeat FLP and CMP Goal LDL < 70 

## 2019-09-20 NOTE — Assessment & Plan Note (Signed)
3-5 minute discussion regarding the harms of tobacco use, the benefits of cessation, and methods of cessation Discussed that there are medication options to help with cessation Patient reports he will try vaping to help quit smoking Will reassess at next visit

## 2019-09-20 NOTE — Assessment & Plan Note (Signed)
Well controlled with A1c 5.7 Continue working on healthy lifestyle changes F/u in 6 months

## 2019-09-20 NOTE — Assessment & Plan Note (Signed)
Well controlled No oral medication needed at this time Recheck GAD 7 at next visit Follow up in 6 months

## 2019-09-20 NOTE — Patient Instructions (Addendum)
Hypertension, Adult High blood pressure (hypertension) is when the force of blood pumping through the arteries is too strong. The arteries are the blood vessels that carry blood from the heart throughout the body. Hypertension forces the heart to work harder to pump blood and may cause arteries to become narrow or stiff. Untreated or uncontrolled hypertension can cause a heart attack, heart failure, a stroke, kidney disease, and other problems. A blood pressure reading consists of a higher number over a lower number. Ideally, your blood pressure should be below 120/80. The first ("top") number is called the systolic pressure. It is a measure of the pressure in your arteries as your heart beats. The second ("bottom") number is called the diastolic pressure. It is a measure of the pressure in your arteries as the heart relaxes. What are the causes? The exact cause of this condition is not known. There are some conditions that result in or are related to high blood pressure. What increases the risk? Some risk factors for high blood pressure are under your control. The following factors may make you more likely to develop this condition:  Smoking.  Having type 2 diabetes mellitus, high cholesterol, or both.  Not getting enough exercise or physical activity.  Being overweight.  Having too much fat, sugar, calories, or salt (sodium) in your diet.  Drinking too much alcohol. Some risk factors for high blood pressure may be difficult or impossible to change. Some of these factors include:  Having chronic kidney disease.  Having a family history of high blood pressure.  Age. Risk increases with age.  Race. You may be at higher risk if you are African American.  Gender. Men are at higher risk than women before age 27. After age 17, women are at higher risk than men.  Having obstructive sleep apnea.  Stress. What are the signs or symptoms? High blood pressure may not cause symptoms. Very high  blood pressure (hypertensive crisis) may cause:  Headache.  Anxiety.  Shortness of breath.  Nosebleed.  Nausea and vomiting.  Vision changes.  Severe chest pain.  Seizures. How is this diagnosed? This condition is diagnosed by measuring your blood pressure while you are seated, with your arm resting on a flat surface, your legs uncrossed, and your feet flat on the floor. The cuff of the blood pressure monitor will be placed directly against the skin of your upper arm at the level of your heart. It should be measured at least twice using the same arm. Certain conditions can cause a difference in blood pressure between your right and left arms. Certain factors can cause blood pressure readings to be lower or higher than normal for a short period of time:  When your blood pressure is higher when you are in a health care provider's office than when you are at home, this is called white coat hypertension. Most people with this condition do not need medicines.  When your blood pressure is higher at home than when you are in a health care provider's office, this is called masked hypertension. Most people with this condition may need medicines to control blood pressure. If you have a high blood pressure reading during one visit or you have normal blood pressure with other risk factors, you may be asked to:  Return on a different day to have your blood pressure checked again.  Monitor your blood pressure at home for 1 week or longer. If you are diagnosed with hypertension, you may have other blood or  imaging tests to help your health care provider understand your overall risk for other conditions. How is this treated? This condition is treated by making healthy lifestyle changes, such as eating healthy foods, exercising more, and reducing your alcohol intake. Your health care provider may prescribe medicine if lifestyle changes are not enough to get your blood pressure under control, and  if:  Your systolic blood pressure is above 130.  Your diastolic blood pressure is above 80. Your personal target blood pressure may vary depending on your medical conditions, your age, and other factors. Follow these instructions at home: Eating and drinking   Eat a diet that is high in fiber and potassium, and low in sodium, added sugar, and fat. An example eating plan is called the DASH (Dietary Approaches to Stop Hypertension) diet. To eat this way: ? Eat plenty of fresh fruits and vegetables. Try to fill one half of your plate at each meal with fruits and vegetables. ? Eat whole grains, such as whole-wheat pasta, brown rice, or whole-grain bread. Fill about one fourth of your plate with whole grains. ? Eat or drink low-fat dairy products, such as skim milk or low-fat yogurt. ? Avoid fatty cuts of meat, processed or cured meats, and poultry with skin. Fill about one fourth of your plate with lean proteins, such as fish, chicken without skin, beans, eggs, or tofu. ? Avoid pre-made and processed foods. These tend to be higher in sodium, added sugar, and fat.  Reduce your daily sodium intake. Most people with hypertension should eat less than 1,500 mg of sodium a day.  Do not drink alcohol if: ? Your health care provider tells you not to drink. ? You are pregnant, may be pregnant, or are planning to become pregnant.  If you drink alcohol: ? Limit how much you use to:  0-1 drink a day for women.  0-2 drinks a day for men. ? Be aware of how much alcohol is in your drink. In the [place] one drink equals one 12 oz bottle of beer (355 mL), one 5 oz glass of wine (148 mL), or one 1 oz glass of hard liquor (44 mL). Lifestyle   Work with your health care provider to maintain a healthy body weight or to lose weight. Ask what an ideal weight is for you.  Get at least 30 minutes of exercise most days of the week. Activities may include walking, swimming, or biking.  Include exercise to  strengthen your muscles (resistance exercise), such as Pilates or lifting weights, as part of your weekly exercise routine. Try to do these types of exercises for 30 minutes at least 3 days a week.  Do not use any products that contain nicotine or tobacco, such as cigarettes, e-cigarettes, and chewing tobacco. If you need help quitting, ask your health care provider.  Monitor your blood pressure at home as told by your health care provider.  Keep all follow-up visits as told by your health care provider. This is important. Medicines  Take over-the-counter and prescription medicines only as told by your health care provider. Follow directions carefully. Blood pressure medicines must be taken as prescribed.  Do not skip doses of blood pressure medicine. Doing this puts you at risk for problems and can make the medicine less effective.  Ask your health care provider about side effects or reactions to medicines that you should watch for. Contact a health care provider if you:  Think you are having a reaction to a medicine you  are taking.  Have headaches that keep coming back (recurring).  Feel dizzy.  Have swelling in your ankles.  Have trouble with your vision. Get help right away if you:  Develop a severe headache or confusion.  Have unusual weakness or numbness.  Feel faint.  Have severe pain in your chest or abdomen.  Vomit repeatedly.  Have trouble breathing. Summary  Hypertension is when the force of blood pumping through your arteries is too strong. If this condition is not controlled, it may put you at risk for serious complications.  Your personal target blood pressure may vary depending on your medical conditions, your age, and other factors. For most people, a normal blood pressure is less than 120/80.  Hypertension is treated with lifestyle changes, medicines, or a combination of both. Lifestyle changes include losing weight, eating a healthy, low-sodium diet,  exercising more, and limiting alcohol. This information is not intended to replace advice given to you by your health care provider. Make sure you discuss any questions you have with your health care provider. Document Revised: 08/31/2017 Document Reviewed: 08/31/2017 Elsevier Patient Education  [employer].    Peripheral Neuropathy Peripheral neuropathy is a type of nerve damage. It affects nerves that carry signals between the spinal cord and the arms, legs, and the rest of the body (peripheral nerves). It does not affect nerves in the spinal cord or brain. In peripheral neuropathy, one nerve or a group of nerves may be damaged. Peripheral neuropathy is a broad category that includes many specific nerve disorders, like diabetic neuropathy, hereditary neuropathy, and carpal tunnel syndrome. What are the causes? This condition may be caused by:  Diabetes. This is the most common cause of peripheral neuropathy.  Nerve injury.  Pressure or stress on a nerve that lasts a long time.  Lack (deficiency) of B vitamins. This can result from alcoholism, poor diet, or a restricted diet.  Infections.  Autoimmune diseases, such as rheumatoid arthritis and systemic lupus erythematosus.  Nerve diseases that are passed from parent to child (inherited).  Some medicines, such as cancer medicines (chemotherapy).  Poisonous (toxic) substances, such as lead and mercury.  Too little blood flowing to the legs.  Kidney disease.  Thyroid disease. In some cases, the cause of this condition is not known. What are the signs or symptoms? Symptoms of this condition depend on which of your nerves is damaged. Common symptoms include:  Loss of feeling (numbness) in the feet, hands, or both.  Tingling in the feet, hands, or both.  Burning pain.  Very sensitive skin.  Weakness.  Not being able to move a part of the body (paralysis).  Muscle twitching.  Clumsiness or poor  coordination.  Loss of balance.  Not being able to control your bladder.  Feeling dizzy.  Sexual problems. How is this diagnosed? Diagnosing and finding the cause of peripheral neuropathy can be difficult. Your health care provider will take your medical history and do a physical exam. A neurological exam will also be done. This involves checking things that are affected by your brain, spinal cord, and nerves (nervous system). For example, your health care provider will check your reflexes, how you move, and what you can feel. You may have other tests, such as:  Blood tests.  Electromyogram (EMG) and nerve conduction tests. These tests check nerve function and how well the nerves are controlling the muscles.  Imaging tests, such as CT scans or MRI to rule out other causes of your symptoms.  Removing a small piece of nerve to be examined in a lab (nerve biopsy). This is rare.  Removing and examining a small amount of the fluid that surrounds the brain and spinal cord (lumbar puncture). This is rare. How is this treated? Treatment for this condition may involve:  Treating the underlying cause of the neuropathy, such as diabetes, kidney disease, or vitamin deficiencies.  Stopping medicines that can cause neuropathy, such as chemotherapy.  Medicine to relieve pain. Medicines may include: ? Prescription or over-the-counter pain medicine. ? Antiseizure medicine. ? Antidepressants. ? Pain-relieving patches that are applied to painful areas of skin.  Surgery to relieve pressure on a nerve or to destroy a nerve that is causing pain.  Physical therapy to help improve movement and balance.  Devices to help you move around (assistive devices). Follow these instructions at home: Medicines  Take over-the-counter and prescription medicines only as told by your health care provider. Do not take any other medicines without first asking your health care provider.  Do not drive or use heavy  machinery while taking prescription pain medicine. Lifestyle   Do not use any products that contain nicotine or tobacco, such as cigarettes and e-cigarettes. Smoking keeps blood from reaching damaged nerves. If you need help quitting, ask your health care provider.  Avoid or limit alcohol. Too much alcohol can cause a vitamin B deficiency, and vitamin B is needed for healthy nerves.  Eat a healthy diet. This includes: ? Eating foods that are high in fiber, such as fresh fruits and vegetables, whole grains, and beans. ? Limiting foods that are high in fat and processed sugars, such as fried or sweet foods. General instructions   If you have diabetes, work closely with your health care provider to keep your blood sugar under control.  If you have numbness in your feet: ? Check every day for signs of injury or infection. Watch for redness, warmth, and swelling. ? Wear padded socks and comfortable shoes. These help protect your feet.  Develop a good support system. Living with peripheral neuropathy can be stressful. Consider talking with a mental health specialist or joining a support group.  Use assistive devices and attend physical therapy as told by your health care provider. This may include using a walker or a cane.  Keep all follow-up visits as told by your health care provider. This is important. Contact a health care provider if:  You have new signs or symptoms of peripheral neuropathy.  You are struggling emotionally from dealing with peripheral neuropathy.  Your pain is not well-controlled. Get help right away if:  You have an injury or infection that is not healing normally.  You develop new weakness in an arm or leg.  You fall frequently. Summary  Peripheral neuropathy is when the nerves in the arms, or legs are damaged, resulting in numbness, weakness, or pain.  There are many causes of peripheral neuropathy, including diabetes, pinched nerves, vitamin  deficiencies, autoimmune disease, and hereditary conditions.  Diagnosing and finding the cause of peripheral neuropathy can be difficult. Your health care provider will take your medical history, do a physical exam, and do tests, including blood tests and nerve function tests.  Treatment involves treating the underlying cause of the neuropathy and taking medicines to help control pain. Physical therapy and assistive devices may also help. This information is not intended to replace advice given to you by your health care provider. Make sure you discuss any questions you have with your health care  provider. Document Revised: 12/03/2016 Document Reviewed: 03/01/2016 Elsevier Patient Education  2020 [employer].

## 2019-09-20 NOTE — Assessment & Plan Note (Signed)
Well controlled  No oral medication  PHQ score 0 Continue to monitor Follow up in 6 months

## 2019-09-20 NOTE — Assessment & Plan Note (Addendum)
Well controlled BP today 104/67 Continue current medications Recheck metabolic panel F/u in 6 months

## 2019-09-20 NOTE — Assessment & Plan Note (Signed)
Persistent pain, no improvement on Lyrica Will start Nortriptyline 10 mg daily Will advise Dr. Dossie Arbour of medication changes May increase if needed Follow up in 6 months

## 2019-09-21 ENCOUNTER — Telehealth: Payer: Self-pay

## 2019-09-21 LAB — COMPREHENSIVE METABOLIC PANEL
ALT: 14 IU/L (ref 0–44)
AST: 15 IU/L (ref 0–40)
Albumin/Globulin Ratio: 1.4 (ref 1.2–2.2)
Albumin: 4.1 g/dL (ref 3.8–4.8)
Alkaline Phosphatase: 67 IU/L (ref 44–121)
BUN/Creatinine Ratio: 18 (ref 10–24)
BUN: 14 mg/dL (ref 8–27)
Bilirubin Total: 0.4 mg/dL (ref 0.0–1.2)
CO2: 23 mmol/L (ref 20–29)
Calcium: 9.2 mg/dL (ref 8.6–10.2)
Chloride: 97 mmol/L (ref 96–106)
Creatinine, Ser: 0.79 mg/dL (ref 0.76–1.27)
GFR calc Af Amer: 112 mL/min/{1.73_m2} (ref >59)
GFR calc non Af Amer: 97 mL/min/{1.73_m2} (ref >59)
Globulin, Total: 2.9 g/dL (ref 1.5–4.5)
Glucose: 97 mg/dL (ref 65–99)
Potassium: 4.3 mmol/L (ref 3.5–5.2)
Sodium: 135 mmol/L (ref 134–144)
Total Protein: 7 g/dL (ref 6.0–8.5)

## 2019-09-21 LAB — LIPID PANEL WITH LDL/HDL RATIO
Cholesterol, Total: 123 mg/dL (ref 100–199)
HDL: 46 mg/dL (ref >39)
LDL Chol Calc (NIH): 57 mg/dL (ref 0–99)
LDL/HDL Ratio: 1.2 ratio (ref 0.0–3.6)
Triglycerides: 112 mg/dL (ref 0–149)
VLDL Cholesterol Cal: 20 mg/dL (ref 5–40)

## 2019-09-21 NOTE — Telephone Encounter (Signed)
Pt advised.   Thanks,   -Alease Fait  

## 2019-09-21 NOTE — Telephone Encounter (Signed)
-----   Message from Virginia Crews, MD sent at 09/21/2019  8:05 AM EDT ----- Normal labs

## 2019-10-01 ENCOUNTER — Other Ambulatory Visit: Payer: Self-pay

## 2019-10-01 ENCOUNTER — Ambulatory Visit
Admission: RE | Admit: 2019-10-01 | Discharge: 2019-10-01 | Disposition: A | Discharge status: Home / Self Care | Payer: Medicaid Other | Source: Ambulatory Visit | Attending: Pain Medicine | Admitting: Pain Medicine

## 2019-10-01 DIAGNOSIS — M4722 Other spondylosis with radiculopathy, cervical region: Secondary | ICD-10-CM | POA: Diagnosis not present

## 2019-10-01 DIAGNOSIS — M5412 Radiculopathy, cervical region: Secondary | ICD-10-CM | POA: Diagnosis not present

## 2019-10-01 DIAGNOSIS — M5010 Cervical disc disorder with radiculopathy, unspecified cervical region: Secondary | ICD-10-CM | POA: Insufficient documentation

## 2019-10-01 DIAGNOSIS — R937 Abnormal findings on diagnostic imaging of other parts of musculoskeletal system: Secondary | ICD-10-CM | POA: Insufficient documentation

## 2019-10-01 DIAGNOSIS — M4802 Spinal stenosis, cervical region: Secondary | ICD-10-CM | POA: Diagnosis not present

## 2019-10-01 DIAGNOSIS — M542 Cervicalgia: Secondary | ICD-10-CM

## 2019-10-02 ENCOUNTER — Other Ambulatory Visit: Payer: Self-pay | Admitting: Cardiovascular Disease

## 2019-10-02 NOTE — Telephone Encounter (Signed)
Please advise if ok to refill HCTZ 25 mg tablet qd.  Last filled Historical Provider.

## 2019-10-03 ENCOUNTER — Other Ambulatory Visit: Payer: Self-pay | Admitting: Family Medicine

## 2019-10-03 ENCOUNTER — Other Ambulatory Visit: Payer: Self-pay | Admitting: Urology

## 2019-10-03 NOTE — Telephone Encounter (Signed)
This was refilled yesterday.

## 2019-10-04 ENCOUNTER — Other Ambulatory Visit: Payer: Self-pay | Admitting: Urology

## 2019-10-04 DIAGNOSIS — M5412 Radiculopathy, cervical region: Secondary | ICD-10-CM | POA: Diagnosis not present

## 2019-10-04 DIAGNOSIS — M960 Pseudarthrosis after fusion or arthrodesis: Secondary | ICD-10-CM | POA: Diagnosis not present

## 2019-10-04 DIAGNOSIS — Z981 Arthrodesis status: Secondary | ICD-10-CM | POA: Diagnosis not present

## 2019-10-06 NOTE — Progress Notes (Signed)
Patient: Caleb [name]  Service Category: E/M  Provider: Gaspar Cola, MD  DOB: 1955/07/30  DOS: 10/17/2019  Location: Office  MRN: [id number]  Setting: Ambulatory outpatient  Referring Provider: Virginia Crews, MD  Type: Established Patient  Specialty: Interventional Pain Management  PCP: Virginia Crews, MD  Location: Remote location  Delivery: TeleHealth     Virtual Encounter - Pain Management PROVIDER NOTE: Information contained herein reflects review and annotations entered in association with encounter. Interpretation of such information and data should be left to medically-trained personnel. Information provided to patient can be located elsewhere in the medical record under "Patient Instructions". Document created using STT-dictation technology, any transcriptional errors that may result from process are unintentional.    Contact & Pharmacy Preferred: [phone] Home: [phone] (home) Mobile: [phone] (mobile) E-mail: [email or web address]  [id number] [place] Rabon, [place] [address] [place] [address] Phone: [phone] Fax: [place], [place] [address] [place] [address] Phone: [phone] Fax: [phone]   Pre-screening  Mr. Carreker offered "in-person" vs "virtual" encounter. He indicated preferring virtual for this encounter.   Reason COVID-19*  Social distancing based on CDC and AMA recommendations.   I contacted Caleb [name] on 10/17/2019 via telephone.      I clearly identified myself as Gaspar Cola, MD. I verified that I was speaking with the correct person using two identifiers (Name: SLEVIN GUNBY, and date of birth: 28-Jul-1955).  Consent I sought verbal advanced consent from Caleb [name] for virtual visit interactions. I informed Mr. Warren of possible security and privacy concerns, risks, and  limitations associated with providing "not-in-person" medical evaluation and management services. I also informed Mr. Dunwoody of the availability of "in-person" appointments. Finally, I informed him that there would be a charge for the virtual visit and that he could be  personally, fully or partially, financially responsible for it. Mr. Redner expressed understanding and agreed to proceed.   Historic Elements   Mr. RAN TULLIS is a 64 y.o. year old, male patient evaluated today after our last contact on 09/13/2019. Mr. Mussell  has a past medical history of Chronic foot pain, right (2015), COPD (chronic obstructive pulmonary disease) ([place]), Coronary artery disease, Emphysema lung ([place]), Hyperlipidemia, Hypertension, Leucocytosis, Myocardial infarction ([place]) (2015), and OSA on CPAP. He also  has a past surgical history that includes Cardiac catheterization (2015); Lumbar laminectomy (1989, 1999); Cervical fusion (1988, 1998); Liver surgery (2015); Partial colectomy (1990); Inguinal hernia repair (Bilateral, 1975); Lithotripsy; and Colonoscopy with propofol (N/A, 02/24/2017). Mr. Mcphearson has a current medication list which includes the following prescription(s): anoro ellipta, aspirin ec, cetirizine, fluocinolone, fluticasone, hydrochlorothiazide, hydrocodone-acetaminophen, [START ON 11/03/2019] hydrocodone-acetaminophen, [START ON 12/03/2019] hydrocodone-acetaminophen, isosorbide mononitrate, losartan, methocarbamol, naloxone, neomycin-polymyxin-hydrocortisone, nitroglycerin, pregabalin, rosuvastatin, and tadalafil. He  reports that he has been smoking cigarettes. He has a 38.25 pack-year smoking history. He has never used smokeless tobacco. He reports current alcohol use of about 4.0 standard drinks of alcohol per week. He reports that he does not use drugs. Mr. Clayburn is allergic to lisinopril and oxycodone.   HPI  Today, he is being contacted for a post-procedure assessment.  The patient refers having  attained 100% relief of the pain that seems to be ongoing after that 09/13/2019 right superficial peroneal nerve block.  At this point he refers that his primary problem now is that of the neck but he is having this  taken care of by Dr. Cari Caraway who is evaluating him for possible cervical spine surgery.  They have not given him a particular date for that surgery, but he was recently sent to the ENT to evaluate his vocal cords to determine on which side of the neck he can go in.  In terms of his lower extremity he refers that the only thing that is currently hurt and he is his big toe, which he has had problems with that for a number of years and is currently being treated by his podiatrist who has been doing injections into the toe joint.  The patient's recent cervical MRI revealed:  1. 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 2. Moderate bilateral C7-T1 neural foraminal stenosis, unchanged.  Post-Procedure Evaluation  Procedure (09/13/2019): Diagnostic right sided superficial peroneal/fibular nerve block under fluoroscopic guidance and IV sedation Pre-procedure pain level: 7/10 Post-procedure: 3/10 (> 50% relief)  Sedation: Sedation provided.  Effectiveness during initial hour after procedure(Ultra-Short Term Relief):   100%.  Local anesthetic used: Long-acting (4-6 hours) Effectiveness: Defined as any analgesic benefit obtained secondary to the administration of local anesthetics. This carries significant diagnostic value as to the etiological location, or anatomical origin, of the pain. Duration of benefit is expected to coincide with the duration of the local anesthetic used.  Effectiveness during initial 4-6 hours after procedure(Short-Term Relief):   100%.  Long-term benefit: Defined as any relief past the pharmacologic duration of the local anesthetics.  Effectiveness past the initial 6 hours after procedure(Long-Term Relief):   100%.  Current  benefits: Defined as benefit that persist at this time.   Analgesia:  90-100% better Function: Mr. Gregori reports improvement in function ROM: Mr. Remer reports improvement in ROM  Pharmacotherapy Assessment  Analgesic: Hydrocodone/APAP 5/325 1 tablet every 6 hours (20 mg/day of hydrocodone).  (Unable to tolerate an oxycodone IR trial due to stomachaches, nausea, headaches, and excessive somnolence.  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   Monitoring: [place] PMP: PDMP reviewed during this encounter.       Pharmacotherapy: No side-effects or adverse reactions reported. Compliance: No problems identified. Effectiveness: Clinically acceptable. Plan: Refer to "POC".  UDS:  Summary  Date Value Ref Range Status  09/11/2019 Note  Final    Comment:    ==================================================================== [employer] Select 13 (MW) ==================================================================== Test                             Result       Flag       Units  Drug Present and Declared for Prescription Verification   Hydrocodone                    1156         EXPECTED   ng/mg creat   Hydromorphone                  500          EXPECTED   ng/mg creat   Dihydrocodeine                 90           EXPECTED   ng/mg creat   Norhydrocodone                 1409         EXPECTED   ng/mg creat  Sources of hydrocodone include scheduled prescription medications.    Hydromorphone, dihydrocodeine and norhydrocodone are expected    metabolites of hydrocodone. Hydromorphone and dihydrocodeine are    also available as scheduled prescription medications.  Drug Present not Declared for Prescription Verification   Tramadol                       370          UNEXPECTED ng/mg creat   O-Desmethyltramadol            545          UNEXPECTED ng/mg creat   N-Desmethyltramadol            48           UNEXPECTED ng/mg creat    Source of tramadol is a  prescription medication. O-desmethyltramadol    and N-desmethyltramadol are expected metabolites of tramadol.  ==================================================================== Test                      Result    Flag   Units      Ref Range   Creatinine              176              mg/dL      >=20 ==================================================================== Declared Medications:  The flagging and interpretation on this report are based on the  following declared medications.  Unexpected results may arise from  inaccuracies in the declared medications.   **Note: The testing scope of this panel includes these medications:   Hydrocodone (Norco)   **Note: The testing scope of this panel does not include the  following reported medications:   Acetaminophen (Norco)  Aspirin  Cetirizine (Zyrtec)  Doxycycline  Fluticasone (Flonase)  Hydrochlorothiazide  Hydrocortisone  Isosorbide (Imdur)  Losartan (Cozaar)  Methocarbamol (Robaxin)  Mometasone  Naloxone (Narcan)  Neomycin  Nitroglycerin (Nitrostat)  Polymyxin B (Polymyxin)  Pregabalin (Lyrica)  Rosuvastatin (Crestor)  Supplement  Tadalafil (Cialis)  Topical  Topical Lidocaine (Lidoderm)  Umeclidinium (Anoro)  Vilanterol (Anoro) ==================================================================== For clinical consultation, please call [phone]. ====================================================================     Laboratory Chemistry Profile   Renal Lab Results  Component Value Date   BUN 14 09/20/2019   CREATININE 0.79 09/20/2019   BCR 18 09/20/2019   GFRAA 112 09/20/2019   GFRNONAA 97 09/20/2019     Hepatic Lab Results  Component Value Date   AST 15 09/20/2019   ALT 14 09/20/2019   ALBUMIN 4.1 09/20/2019   ALKPHOS 67 09/20/2019     Electrolytes Lab Results  Component Value Date   NA 135 09/20/2019   K 4.3 09/20/2019   CL 97 09/20/2019   CALCIUM 9.2 09/20/2019   MG 2.0 05/30/2018      Bone Lab Results  Component Value Date   25OHVITD1 32 02/17/2017   25OHVITD2 <1.0 02/17/2017   25OHVITD3 32 02/17/2017   TESTOFREE 5.2 (L) 02/28/2017   TESTOSTERONE 279 02/28/2017     Inflammation (CRP: Acute Phase) (ESR: Chronic Phase) Lab Results  Component Value Date   CRP 8 11/07/2017   ESRSEDRATE 26 11/07/2017       Note: Above Lab results reviewed.  Imaging  MR CERVICAL SPINE WO CONTRAST CLINICAL DATA:  Chronic neck pain  EXAM: MRI CERVICAL SPINE WITHOUT CONTRAST  TECHNIQUE: Multiplanar, multisequence MR imaging of the cervical spine was performed. No intravenous contrast was administered.  COMPARISON:  Cervical spine MRI 08/12/2017  FINDINGS: Alignment: Physiologic.  Vertebrae: No fracture, evidence of discitis, or bone lesion.  Cord: Normal signal and morphology.  Posterior Fossa, vertebral arteries, paraspinal tissues: Negative.  Disc levels:  C1-2: Unremarkable.  C2-3: Normal disc space and facet joints. There is no spinal canal stenosis. No neural foraminal stenosis.  C3-4: Normal disc space and facet joints. There is no spinal canal stenosis. No neural foraminal stenosis.  C4-5: Disc desiccation with mild uncovertebral spurring. There is no spinal canal stenosis. No neural foraminal stenosis.  C5-6: Disc space narrowing with bilateral uncovertebral hypertrophy. Mild spinal canal stenosis. Unchanged severe right and mild left neural foraminal stenosis.  C6-7: Intervertebral disc spacer. Endplate spurring. There is no spinal canal stenosis. No neural foraminal stenosis.  C7-T1: Small disc bulge. Moderate bilateral foraminal stenosis, unchanged. There is no spinal canal stenosis. No neural foraminal stenosis.  IMPRESSION: 1. 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 2. Moderate bilateral C7-T1 neural foraminal stenosis, unchanged.  Electronically Signed   By: Ulyses Jarred M.D.   On:  10/02/2019 00:30  Assessment  The primary encounter diagnosis was Cervical disc disorder with radiculopathy of cervical region. A diagnosis of Abnormal MRI, cervical spine (08/13/2017) was also pertinent to this visit.  Plan of Care  Problem-specific:  No problem-specific Assessment & Plan notes found for this encounter.  Mr. KEILAND PICKERING has a current medication list which includes the following long-term medication(s): cetirizine, hydrochlorothiazide, hydrocodone-acetaminophen, [START ON 11/03/2019] hydrocodone-acetaminophen, [START ON 12/03/2019] hydrocodone-acetaminophen, isosorbide mononitrate, losartan, methocarbamol, nitroglycerin, pregabalin, rosuvastatin, and tadalafil.  Pharmacotherapy (Medications Ordered): No orders of the defined types were placed in this encounter.  Orders:  No orders of the defined types were placed in this encounter.  Follow-up plan:   Return in about 7 weeks (around 12/06/2019) for (F2F), (Med Mgmt).      Interventional management options: Considering:   Possible spinal cord stimulator trial  Possible bilateral cervical facet RFA Diagnostic right ankle block Diagnosticright lumbar sympathetic block Diagnostic midlineTESI Diagnostic bilateral thoracic facet block Possible bilateral thoracic facet RFA   Palliative PRN treatment(s):   Palliative left CESI #4 Diagnostic bilateral cervical facet block#3 Palliative/diagnostic right-sidedCommon Peroneal NB#3 Therapeutic right-sided deep peroneal nerve block #2  Palliative right-sided common peroneal nerve RFA #2(last done on 06/21/2017)         Recent Visits Date Type Provider Dept  09/13/19 Procedure visit Milinda Pointer, MD [facility]  09/04/19 Office Visit Gillis Santa, MD [facility]  08/20/19 Telemedicine Milinda Pointer, MD [facility]  08/01/19 Telemedicine Milinda Pointer, MD [facility]  Showing recent visits within  past 90 days and meeting all other requirements Today's Visits Date Type Provider Dept  10/17/19 Telemedicine Milinda Pointer, MD [facility]  Showing today's visits and meeting all other requirements Future Appointments Date Type Provider Dept  12/24/19 Appointment Milinda Pointer, MD [facility]  Showing future appointments within next 90 days and meeting all other requirements  I discussed the assessment and treatment plan with the patient. The patient was provided an opportunity to ask questions and all were answered. The patient agreed with the plan and demonstrated an understanding of the instructions.  Patient advised to call back or seek an in-person evaluation if the symptoms or condition worsens.  Duration of encounter: 12 minutes.  Note by: Gaspar Cola, MD Date: 10/17/2019; Time: 4:48 PM

## 2019-10-09 ENCOUNTER — Telehealth: Payer: Medicaid Other | Admitting: Pain Medicine

## 2019-10-11 ENCOUNTER — Ambulatory Visit: Payer: Medicaid Other | Admitting: Podiatry

## 2019-10-12 ENCOUNTER — Other Ambulatory Visit: Payer: Self-pay | Admitting: Family Medicine

## 2019-10-12 DIAGNOSIS — G5791 Unspecified mononeuropathy of right lower limb: Secondary | ICD-10-CM

## 2019-10-12 NOTE — Telephone Encounter (Signed)
Requested medication (s) are due for refill today: No  Requested medication (s) are on the active medication list:yes  Last refill:  09/20/19  #30  3 refills  Future visit scheduled: yes in 5 months  Notes to clinic:  Pharmacy need Dx code. Requesting conversion to 90 day    Requested Prescriptions  Pending Prescriptions Disp Refills   nortriptyline (PAMELOR) 10 MG capsule [Pharmacy Med Name: NORTRIPTYLINE HCL 10 MG CAP] 90 capsule 2    Sig: Take 1 capsule (10 mg total) by mouth at bedtime.      Psychiatry:  Antidepressants - Heterocyclics (TCAs) Passed - 10/12/2019  8:34 AM      Passed - Completed PHQ-2 or PHQ-9 in the last 360 days.      Passed - Valid encounter within last 6 months    Recent Outpatient Visits           3 weeks ago Essential hypertension   [place], Dionne Bucy, MD   2 months ago Open toe wound, initial encounter   [facility] [place], Dionne Bucy, MD   2 months ago Plaque psoriasis   [place], [place]   3 months ago Acute midline low back pain without sciatica   [place], [place]   6 months ago Chest pain, unspecified type   [facility], Dionne Bucy, MD       Future Appointments             In 5 days Milinda Pointer, MD [place]   In 5 months [place], Dionne Bucy, MD [facility], [place]

## 2019-10-15 DIAGNOSIS — M542 Cervicalgia: Secondary | ICD-10-CM | POA: Diagnosis not present

## 2019-10-15 DIAGNOSIS — R293 Abnormal posture: Secondary | ICD-10-CM | POA: Diagnosis not present

## 2019-10-15 DIAGNOSIS — M5412 Radiculopathy, cervical region: Secondary | ICD-10-CM | POA: Diagnosis not present

## 2019-10-16 ENCOUNTER — Other Ambulatory Visit: Payer: Self-pay

## 2019-10-16 ENCOUNTER — Ambulatory Visit: Payer: Medicaid Other | Admitting: Podiatry

## 2019-10-16 ENCOUNTER — Encounter: Payer: Self-pay | Admitting: Podiatry

## 2019-10-16 DIAGNOSIS — R490 Dysphonia: Secondary | ICD-10-CM | POA: Diagnosis not present

## 2019-10-16 DIAGNOSIS — L97511 Non-pressure chronic ulcer of other part of right foot limited to breakdown of skin: Secondary | ICD-10-CM | POA: Diagnosis not present
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
## 2019-10-17 ENCOUNTER — Ambulatory Visit: Payer: Medicaid Other | Attending: Pain Medicine | Admitting: Pain Medicine

## 2019-10-17 DIAGNOSIS — R937 Abnormal findings on diagnostic imaging of other parts of musculoskeletal system: Secondary | ICD-10-CM

## 2019-10-17 DIAGNOSIS — M5010 Cervical disc disorder with radiculopathy, unspecified cervical region: Secondary | ICD-10-CM | POA: Diagnosis not present

## 2019-10-19 DIAGNOSIS — H5213 Myopia, bilateral: Secondary | ICD-10-CM | POA: Diagnosis not present

## 2019-10-19 DIAGNOSIS — M542 Cervicalgia: Secondary | ICD-10-CM | POA: Diagnosis not present

## 2019-10-29 DIAGNOSIS — M542 Cervicalgia: Secondary | ICD-10-CM | POA: Diagnosis not present

## 2019-10-30 ENCOUNTER — Other Ambulatory Visit: Payer: Self-pay | Admitting: Internal Medicine

## 2019-10-31 ENCOUNTER — Ambulatory Visit
Admission: RE | Admit: 2019-10-31 | Discharge: 2019-10-31 | Disposition: A | Discharge status: Home / Self Care | Payer: Medicaid Other | Source: Ambulatory Visit | Attending: Urology | Admitting: Urology

## 2019-10-31 ENCOUNTER — Encounter: Payer: Self-pay | Admitting: Urology

## 2019-10-31 ENCOUNTER — Other Ambulatory Visit: Payer: Self-pay

## 2019-10-31 ENCOUNTER — Ambulatory Visit (INDEPENDENT_AMBULATORY_CARE_PROVIDER_SITE_OTHER): Payer: Medicaid Other | Admitting: Urology

## 2019-10-31 VITALS — BP 114/76 | HR 76 | Ht 68.0 in | Wt 189.8 lb

## 2019-10-31 DIAGNOSIS — Z87442 Personal history of urinary calculi: Secondary | ICD-10-CM

## 2019-10-31 DIAGNOSIS — N200 Calculus of kidney: Secondary | ICD-10-CM

## 2019-10-31 DIAGNOSIS — N529 Male erectile dysfunction, unspecified: Secondary | ICD-10-CM

## 2019-10-31 MED ORDER — TADALAFIL 20 MG PO TABS: 20.0000 mg | ORAL_TABLET | Freq: Every day | ORAL | 11 refills | Status: DC | PRN

## 2019-10-31 NOTE — Patient Instructions (Signed)
Tadalafil tablets (Cialis) What is this medicine? TADALAFIL (tah DA la fil) is used to treat erection problems in men. It is also used for enlargement of the prostate gland in men, a condition called benign prostatic hyperplasia or BPH. This medicine improves urine flow and reduces BPH symptoms. This medicine can also treat both erection problems and BPH when they occur together. This medicine may be used for other purposes; ask your health care provider or pharmacist if you have questions. COMMON BRAND NAME(S): Adcirca, ALYQ, Cialis What should I tell my health care provider before I take this medicine? They need to know if you have any of these conditions:  bleeding disorders  eye or vision problems, including a rare inherited eye disease called retinitis pigmentosa  anatomical deformation of the penis, Peyronie's disease, or history of priapism (painful and prolonged erection)  heart disease, angina, a history of heart attack, irregular heart beats, or other heart problems  high or low blood pressure  history of blood diseases, like sickle cell anemia or leukemia  history of stomach bleeding  kidney disease  liver disease  stroke  an unusual or allergic reaction to tadalafil, other medicines, foods, dyes, or preservatives  pregnant or trying to get pregnant  breast-feeding How should I use this medicine? Take this medicine by mouth with a glass of water. Follow the directions on the prescription label. You may take this medicine with or without meals. When this medicine is used for erection problems, your doctor may prescribe it to be taken once daily or as needed. If you are taking the medicine as needed, you may be able to have sexual activity 30 minutes after taking it and for up to 36 hours after taking it. Whether you are taking the medicine as needed or once daily, you should not take more than one dose per day. If you are taking this medicine for symptoms of benign  prostatic hyperplasia (BPH) or to treat both BPH and an erection problem, take the dose once daily at about the same time each day. Do not take your medicine more often than directed. Talk to your pediatrician regarding the use of this medicine in children. Special care may be needed. Overdosage: If you think you have taken too much of this medicine contact a poison control center or emergency room at once. NOTE: This medicine is only for you. Do not share this medicine with others. What if I miss a dose? If you are taking this medicine as needed for erection problems, this does not apply. If you miss a dose while taking this medicine once daily for an erection problem, benign prostatic hyperplasia, or both, take it as soon as you remember, but do not take more than one dose per day. What may interact with this medicine? Do not take this medicine with any of the following medications:  nitrates like amyl nitrite, isosorbide dinitrate, isosorbide mononitrate, nitroglycerin  other medicines for erectile dysfunction like avanafil, sildenafil, vardenafil  other tadalafil products (Adcirca)  riociguat This medicine may also interact with the following medications:  certain drugs for high blood pressure  certain drugs for the treatment of HIV infection or AIDS  certain drugs used for fungal or yeast infections, like fluconazole, itraconazole, ketoconazole, and voriconazole  certain drugs used for seizures like carbamazepine, phenytoin, and phenobarbital  grapefruit juice  macrolide antibiotics like clarithromycin, erythromycin, troleandomycin  medicines for prostate problems  rifabutin, rifampin or rifapentine This list may not describe all possible interactions. Give your   health care provider a list of all the medicines, herbs, non-prescription drugs, or dietary supplements you use. Also tell them if you smoke, drink alcohol, or use illegal drugs. Some items may interact with your  medicine. What should I watch for while using this medicine? If you notice any changes in your vision while taking this drug, call your doctor or health care professional as soon as possible. Stop using this medicine and call your health care provider right away if you have a loss of sight in one or both eyes. Contact your doctor or health care professional right away if the erection lasts longer than 4 hours or if it becomes painful. This may be a sign of serious problem and must be treated right away to prevent permanent damage. If you experience symptoms of nausea, dizziness, chest pain or arm pain upon initiation of sexual activity after taking this medicine, you should refrain from further activity and call your doctor or health care professional as soon as possible. Do not drink alcohol to excess (examples, 5 glasses of wine or 5 shots of whiskey) when taking this medicine. When taken in excess, alcohol can increase your chances of getting a headache or getting dizzy, increasing your heart rate or lowering your blood pressure. Using this medicine does not protect you or your partner against HIV infection (the virus that causes AIDS) or other sexually transmitted diseases. What side effects may I notice from receiving this medicine? Side effects that you should report to your doctor or health care professional as soon as possible:  allergic reactions like skin rash, itching or hives, swelling of the face, lips, or tongue  breathing problems  changes in hearing  changes in vision  chest pain  fast, irregular heartbeat  prolonged or painful erection  seizures Side effects that usually do not require medical attention (report to your doctor or health care professional if they continue or are bothersome):  back pain  dizziness  flushing  headache  indigestion  muscle aches  nausea  stuffy or runny nose This list may not describe all possible side effects. Call your doctor  for medical advice about side effects. You may report side effects to FDA at 1-800-FDA-1088. Where should I keep my medicine? Keep out of the reach of children. Store at room temperature between 15 and 30 degrees C (59 and 86 degrees F). Throw away any unused medicine after the expiration date. NOTE: This sheet is a summary. It may not cover all possible information. If you have questions about this medicine, talk to your doctor, pharmacist, or health care provider.  2020 Elsevier/Gold Standard (2013-05-11 13:15:49)  

## 2019-10-31 NOTE — Progress Notes (Signed)
° °  10/31/2019 9:48 AM   Caleb [name] Mar 14, 1955 [id number]  Reason for visit: Follow up nephrolithiasis, ejaculatory dysfunction, ED  HPI: I saw Caleb [name] in follow-up today for the above issues.  He is a 64 year old male with chronic pain who is on narcotics 4 times per day, as well as multiple hypertension medications and sertraline.  Nitroglycerin is on his medication list, however he reports he has never taken this before.  He also has a history of nephrolithiasis, but denies any stone issues over the last year. I personally reviewed his KUB today that shows a stable left lower pole 8 mm stone.  At our last visit I recommended a trial of Cialis 20 mg on demand, and he feels like this helped his erections significantly.  He recently started dating again and is interested in trying this medication again and needs a refill.  We discussed the risks and benefits of Cialis at length, and discussed that it can be taken on demand 2 hours prior to sexual activity, or every other day scheduled.  We also discussed that it cannot be taken with nitrates, and he again confirmed that he is not taking this medication.  He has also had trouble with ejaculations in the past, likely secondary to his narcotics and antidepressants.  We discussed general stone prevention strategies including adequate hydration with goal of producing 2.5 L of urine daily, increasing citric acid intake, increasing calcium intake during high oxalate meals, minimizing animal protein, and decreasing salt intake. Information about dietary recommendations given today.   Cialis 20 mg on demand or every other day for ED RTC 1 year symptom check, [place] U, MD  [facility] [address], [place], [place] [address] [phone]

## 2019-11-01 ENCOUNTER — Other Ambulatory Visit: Payer: Self-pay | Admitting: Family Medicine

## 2019-11-01 DIAGNOSIS — M542 Cervicalgia: Secondary | ICD-10-CM | POA: Diagnosis not present

## 2019-11-06 ENCOUNTER — Ambulatory Visit (INDEPENDENT_AMBULATORY_CARE_PROVIDER_SITE_OTHER): Payer: Medicaid Other | Admitting: Podiatry

## 2019-11-06 ENCOUNTER — Other Ambulatory Visit: Payer: Self-pay

## 2019-11-06 ENCOUNTER — Encounter: Payer: Self-pay | Admitting: Podiatry

## 2019-11-06 DIAGNOSIS — Z01818 Encounter for other preprocedural examination: Secondary | ICD-10-CM

## 2019-11-06 DIAGNOSIS — M2031 Hallux varus (acquired), right foot: Secondary | ICD-10-CM | POA: Diagnosis not present

## 2019-11-06 DIAGNOSIS — M19079 Primary osteoarthritis, unspecified ankle and foot: Secondary | ICD-10-CM

## 2019-11-06 DIAGNOSIS — L97511 Non-pressure chronic ulcer of other part of right foot limited to breakdown of skin: Secondary | ICD-10-CM | POA: Diagnosis not present

## 2019-11-06 DIAGNOSIS — R7303 Prediabetes: Secondary | ICD-10-CM | POA: Diagnosis not present

## 2019-11-06 NOTE — Patient Instructions (Signed)
Pre-Operative Instructions  Congratulations, you have decided to take an important step towards improving your quality of life.  You can be assured that the doctors and staff at [facility] will be with you every step of the way.  Here are some important things you should know:  1. Plan to be at the surgery center/hospital at least 1 (one) hour prior to your scheduled time, unless otherwise directed by the surgical center/hospital staff.  You must have a responsible adult accompany you, remain during the surgery and drive you home.  Make sure you have directions to the surgical center/hospital to ensure you arrive on time. 2. If you are having surgery at [facility] or [facility] hospitals, you will need a copy of your medical history and physical form from your family physician within one month prior to the date of surgery. We will give you a form for your primary physician to complete.  3. We make every effort to accommodate the date you request for surgery.  However, there are times where surgery dates or times have to be moved.  We will contact you as soon as possible if a change in schedule is required.   4. No aspirin/ibuprofen for one week before surgery.  If you are on aspirin, any non-steroidal anti-inflammatory medications (Mobic, Aleve, Ibuprofen) should not be taken seven (7) days prior to your surgery.  You make take Tylenol for pain prior to surgery.  5. Medications - If you are taking daily heart and blood pressure medications, seizure, reflux, allergy, asthma, anxiety, pain or diabetes medications, make sure you notify the surgery center/hospital before the day of surgery so they can tell you which medications you should take or avoid the day of surgery. 6. No food or drink after midnight the night before surgery unless directed otherwise by surgical center/hospital staff. 7. No alcoholic beverages 24-hours prior to surgery.  No smoking 24-hours prior or 24-hours after  surgery. 8. Wear loose pants or shorts. They should be loose enough to fit over bandages, boots, and casts. 9. Don't wear slip-on shoes. Sneakers are preferred. 10. Bring your boot with you to the surgery center/hospital.  Also bring crutches or a walker if your physician has prescribed it for you.  If you do not have this equipment, it will be provided for you after surgery. 11. If you have not been contacted by the surgery center/hospital by the day before your surgery, call to confirm the date and time of your surgery. 12. Leave-time from work may vary depending on the type of surgery you have.  Appropriate arrangements should be made prior to surgery with your employer. 13. Prescriptions will be provided immediately following surgery by your doctor.  Fill these as soon as possible after surgery and take the medication as directed. Pain medications will not be refilled on weekends and must be approved by the doctor. 14. Remove nail polish on the operative foot and avoid getting pedicures prior to surgery. 15. Wash the night before surgery.  The night before surgery wash the foot and leg well with water and the antibacterial soap provided. Be sure to pay special attention to beneath the toenails and in between the toes.  Wash for at least three (3) minutes. Rinse thoroughly with water and dry well with a towel.  Perform this wash unless told not to do so by your physician.  Enclosed: 1 Ice pack (please put in freezer the night before surgery)   1 Hibiclens skin cleaner     Pre-op instructions  If you have any questions regarding the instructions, please do not hesitate to call our office.  [place]: [address], [place], [place] [address] -- [phone]  [place]: [address] [place], [place] [address] -- [phone]  [place]: [address], [place], [place] [address] -- [phone]   Website: [email or web address] 

## 2019-11-07 ENCOUNTER — Encounter: Payer: Self-pay | Admitting: Podiatry

## 2019-11-07 ENCOUNTER — Telehealth: Payer: Self-pay
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
## 2019-11-07 NOTE — Telephone Encounter (Signed)
Copied from [place] [phone]. Topic: General - Other >> Nov 07, 2019 10:25 AM Rainey Pines A wrote: Patient stated that the form he dropped off yesterday for his today surgery, once complete needs to be faxd to [facility] at [facility] at [phone]. Please advise
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
## 2019-11-08 NOTE — Telephone Encounter (Signed)
Left message for Shelly at [facility].

## 2019-11-08 NOTE — Telephone Encounter (Signed)
Given to [place] yesterday. That is a consent form that needs to be done with his surgeon.  Maybe there is a pre-op form they want [place] to complete, but we are unable to complete the consent form

## 2019-11-09 NOTE — Telephone Encounter (Signed)
Pre op form received from [facility].

## 2019-11-13 ENCOUNTER — Ambulatory Visit
Admission: RE | Admit: 2019-11-13 | Discharge: 2019-11-13 | Disposition: A | Discharge status: Home / Self Care | Payer: Medicaid Other | Attending: Family Medicine | Admitting: Family Medicine

## 2019-11-13 ENCOUNTER — Ambulatory Visit: Payer: Medicaid Other | Admitting: Family Medicine

## 2019-11-13 ENCOUNTER — Other Ambulatory Visit: Payer: Self-pay

## 2019-11-13 ENCOUNTER — Telehealth: Payer: Self-pay | Admitting: Family Medicine

## 2019-11-13 ENCOUNTER — Encounter: Payer: Self-pay | Admitting: Family Medicine

## 2019-11-13 ENCOUNTER — Ambulatory Visit
Admission: RE | Admit: 2019-11-13 | Discharge: 2019-11-13 | Disposition: A | Discharge status: Home / Self Care | Payer: Medicaid Other | Source: Ambulatory Visit | Attending: Family Medicine | Admitting: Family Medicine

## 2019-11-13 VITALS — BP 133/83 | HR 65 | Temp 98.3°F | Resp 16 | Ht 68.0 in | Wt 189.0 lb

## 2019-11-13 DIAGNOSIS — R109 Unspecified abdominal pain: Secondary | ICD-10-CM | POA: Insufficient documentation

## 2019-11-13 DIAGNOSIS — Z01818 Encounter for other preprocedural examination: Secondary | ICD-10-CM | POA: Diagnosis not present

## 2019-11-13 DIAGNOSIS — N200 Calculus of kidney: Secondary | ICD-10-CM | POA: Diagnosis not present

## 2019-11-13 LAB — POCT URINALYSIS DIPSTICK
Bilirubin, UA: NEGATIVE
Blood, UA: NEGATIVE
Glucose, UA: NEGATIVE
Ketones, UA: NEGATIVE
Leukocytes, UA: NEGATIVE
Nitrite, UA: NEGATIVE
Protein, UA: NEGATIVE
Spec Grav, UA: 1.02 (ref 1.010–1.025)
Urobilinogen, UA: 0.2 E.U./dL
pH, UA: 6 (ref 5.0–8.0)
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
## 2019-11-13 NOTE — Patient Instructions (Signed)

## 2019-11-14 ENCOUNTER — Telehealth: Payer: Self-pay

## 2019-11-14 NOTE — Telephone Encounter (Signed)
DOS 11/26/2019  EXC BENIGN LESION RT - [id number] ARTHRODESIS INTERPHAL JOINT 1ST RT - 37628  RECEIVED A CALL FROM [facility] AT [place] [phone] EXT [phone]). SHE STATED NO AUTH IN REQUIRED FOR CPT [id number] OR [id number]. REFERENCE # [id number]

## 2019-11-15 DIAGNOSIS — M542 Cervicalgia: Secondary | ICD-10-CM | POA: Diagnosis not present

## 2019-11-19 DIAGNOSIS — M542 Cervicalgia: Secondary | ICD-10-CM | POA: Diagnosis not present

## 2019-11-19 NOTE — Pre-Procedure Instructions (Signed)
Your procedure is scheduled on Monday, November 22nd at 7:30a.m.  Report to [facility] Main Entrance "A" at 5:30 A.M., and check in at the Admitting office.  Call this number if you have problems the morning of surgery:  [phone]  Call [phone] if you have any questions prior to your surgery date Monday-Friday 8am-4pm    Remember:  Do not eat or drink after midnight the night before your surgery    Take these medicines the morning of surgery with A SIP OF WATER  cetirizine (ZYRTEC)  fluticasone (FLONASE) isosorbide mononitrate (IMDUR) pregabalin (LYRICA)  rosuvastatin (CRESTOR) ANORO ELLIPTA   As needed: HYDROcodone-acetaminophen (NORCO)  methocarbamol (ROBAXIN) nitroGLYCERIN (NITROSTAT) -please let a nurse know if you've had to use this.   Follow your surgeon's instructions on when to stop Aspirin.  If no instructions were given by your surgeon then you will need to call the office to get those instructions.    As of today, STOP taking any Aspirin (unless otherwise instructed by your surgeon) Aleve, Naproxen, Ibuprofen, Motrin, Advil, Goody's, BC's, all herbal medications, fish oil, and all vitamins.                     Do not wear jewelry.            Do not wear lotions, powders, colognes, or deodorant.            Men may shave face and neck.            Do not bring valuables to the hospital.            [facility] is not responsible for any belongings or valuables.  Do NOT Smoke (Tobacco/Vaping) or drink Alcohol 24 hours prior to your procedure If you use a CPAP at night, you may bring all equipment for your overnight stay.   Contacts, glasses, dentures or bridgework may not be worn into surgery.      For patients admitted to the hospital, discharge time will be determined by your treatment team.   Patients discharged the day of surgery will not be allowed to drive home, and someone needs to stay with them for 24 hours.    Special instructions:   [facility]-  Preparing For Surgery  Before surgery, you can play an important role. Because skin is not sterile, your skin needs to be as free of germs as possible. You can reduce the number of germs on your skin by washing with CHG (chlorahexidine gluconate) Soap before surgery.  CHG is an antiseptic cleaner which kills germs and bonds with the skin to continue killing germs even after washing.    Oral Hygiene is also important to reduce your risk of infection.  Remember - BRUSH YOUR TEETH THE MORNING OF SURGERY WITH YOUR REGULAR TOOTHPASTE  Please do not use if you have an allergy to CHG or antibacterial soaps. If your skin becomes reddened/irritated stop using the CHG.  Do not shave (including legs and underarms) for at least 48 hours prior to first CHG shower. It is OK to shave your face.  Please follow these instructions carefully.   1. Shower the NIGHT BEFORE SURGERY and the MORNING OF SURGERY with CHG Soap.   2. If you chose to wash your hair, wash your hair first as usual with your normal shampoo.  3. After you shampoo, rinse your hair and body thoroughly to remove the shampoo.  4. Use CHG as you would any other liquid soap.  You can apply CHG directly to the skin and wash gently with a scrungie or a clean washcloth.   5. Apply the CHG Soap to your body ONLY FROM THE NECK DOWN.  Do not use on open wounds or open sores. Avoid contact with your eyes, ears, mouth and genitals (private parts). Wash Face and genitals (private parts)  with your normal soap.   6. Wash thoroughly, paying special attention to the area where your surgery will be performed.  7. Thoroughly rinse your body with warm water from the neck down.  8. DO NOT shower/wash with your normal soap after using and rinsing off the CHG Soap.  9. Pat yourself dry with a CLEAN TOWEL.  10. Wear CLEAN PAJAMAS to bed the night before surgery  11. Place CLEAN SHEETS on your bed the night of your first shower and DO NOT SLEEP WITH  PETS.   Day of Surgery: Wear Clean/Comfortable clothing the morning of surgery Do not apply any deodorants/lotions.   Remember to brush your teeth WITH YOUR REGULAR TOOTHPASTE.   Please read over the following fact sheets that you were given.

## 2019-11-20 ENCOUNTER — Other Ambulatory Visit: Payer: Self-pay

## 2019-11-20 ENCOUNTER — Encounter (HOSPITAL_COMMUNITY)
Admission: RE | Admit: 2019-11-20 | Discharge: 2019-11-20 | Disposition: A | Discharge status: Home / Self Care | Payer: Medicaid Other | Source: Ambulatory Visit | Attending: Physical Medicine & Rehabilitation | Admitting: Physical Medicine & Rehabilitation

## 2019-11-20 ENCOUNTER — Encounter (HOSPITAL_COMMUNITY): Payer: Self-pay

## 2019-11-20 DIAGNOSIS — Z01812 Encounter for preprocedural laboratory examination: Secondary | ICD-10-CM | POA: Diagnosis not present

## 2019-11-20 HISTORY — DX: Syncope and collapse: R55

## 2019-11-20 HISTORY — DX: Personal history of urinary calculi: Z87.442

## 2019-11-20 HISTORY — DX: Cough syncope: R05.4

## 2019-11-20 HISTORY — DX: Family history of other specified conditions: Z84.89

## 2019-11-20 LAB — CBC
HCT: 46.1 % (ref 39.0–52.0)
Hemoglobin: 15.1 g/dL (ref 13.0–17.0)
MCH: 30.9 pg (ref 26.0–34.0)
MCHC: 32.8 g/dL (ref 30.0–36.0)
MCV: 94.3 fL (ref 80.0–100.0)
Platelets: 291 10*3/uL (ref 150–400)
RBC: 4.89 MIL/uL (ref 4.22–5.81)
RDW: 12.6 % (ref 11.5–15.5)
WBC: 11.4 10*3/uL — ABNORMAL HIGH (ref 4.0–10.5)
nRBC: 0 % (ref 0.0–0.2)

## 2019-11-20 LAB — BASIC METABOLIC PANEL
Anion gap: 10 (ref 5–15)
BUN: 14 mg/dL (ref 8–23)
CO2: 26 mmol/L (ref 22–32)
Calcium: 9.2 mg/dL (ref 8.9–10.3)
Chloride: 99 mmol/L (ref 98–111)
Creatinine, Ser: 0.79 mg/dL (ref 0.61–1.24)
GFR, Estimated: >60 mL/min (ref >60)
Glucose, Bld: 94 mg/dL (ref 70–99)
Potassium: 3.9 mmol/L (ref 3.5–5.1)
Sodium: 135 mmol/L (ref 135–145)

## 2019-11-20 NOTE — Progress Notes (Signed)
PCP - Dr. Marcy Siren Cardiologist - Dr. Ida Rogue  Chest x-ray - 11/13/19-1 view EKG - 04/06/19 Stress Test - 03/21/18  ECHO - 04/13/18 Cardiac Cath - 2015; Pt reports 2 stents placed. Requested records from [facility] in [place] CPAP - pt states he is unable to tolerate CPAP. Has equipment but does not currently use.   Blood Thinner Instructions: n/a Aspirin Instructions: Hold 7 days prior to surgery. LD 11/19/19  COVID TEST- 11/22/19-[facility]   Anesthesia review: Yes  Patient denies shortness of breath, fever, cough and chest pain at PAT appointment   All instructions explained to the patient, with a verbal understanding of the material. Patient agrees to go over the instructions while at home for a better understanding. Patient also instructed to self quarantine after being tested for COVID-19. The opportunity to ask questions was provided.    Coronavirus Screening  Have you experienced the following symptoms:  Cough yes/no: No Fever (>100.73F)  yes/no: No Runny nose yes/no: No Sore throat yes/no: No Difficulty breathing/shortness of breath  yes/no: No  Have you or a family member traveled in the last 14 days and where? yes/no: No   If the patient indicates "YES" to the above questions, their PAT will be rescheduled to limit the exposure to others and, the surgeon will be notified. THE PATIENT WILL NEED TO BE ASYMPTOMATIC FOR 14 DAYS.   If the patient is not experiencing any of these symptoms, the PAT nurse will instruct them to NOT bring anyone with them to their appointment since they may have these symptoms or traveled as well.   Please remind your patients and families that hospital visitation restrictions are in effect and the importance of the restrictions.

## 2019-11-20 NOTE — Progress Notes (Signed)
PROVIDER NOTE: Information contained herein reflects review and annotations entered in association with encounter. Interpretation of such information and data should be left to medically-trained personnel. Information provided to patient can be located elsewhere in the medical record under "Patient Instructions". Document created using STT-dictation technology, any transcriptional errors that may result from process are unintentional.    Patient: Caleb [name]  Service Category: E/M  Provider: Oswaldo Done, MD  DOB: [birth year]  DOS: 11/21/2019  Specialty: Interventional Pain Management  MRN: [id number]  Setting: Ambulatory outpatient  PCP: Erasmo Downer, MD  Type: Established Patient    Referring Provider: Erasmo Downer, MD  Location: Office  Delivery: Face-to-face     HPI  Mr. JI FELDNER, a 64 y.o. year old male, is here today because of his Chronic pain syndrome [G89.4]. Mr. Yeldell primary complain today is Foot Pain (right toes ) and Neck Pain Last encounter: My last encounter with him was on 09/13/2019. Pertinent problems: Mr. 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); Cervicalgia; DDD (degenerative disc disease), thoracic; Disorder of superficial peroneal nerve (Right); Chronic upper extremity pain (Left); Cervical spondylosis w/ radiculopathy; Cervical disc disorder with radiculopathy of cervical region; Chronic upper extremity weakness (Left); Abnormal nerve conduction studies (06/08/2017); 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); Spondylosis without myelopathy or radiculopathy,  cervical region; Cervical facet syndrome (Bilateral); Abnormal MRI, cervical spine (10/02/2019 & 08/13/2017); History of fusion of cervical spine; Chronic neck pain (4th area of Pain) (Bilateral) (L>R); Chronic neck pain with history of cervical spinal surgery; LLQ abdominal pain; Neuropathic pain; Nondisplaced fracture of proximal phalanx of left thumb with routine healing; 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 Pain Assessment: Severity of Chronic pain is reported as a 2 /10. Location: Foot Right/denies. Onset: More than a month ago. Quality: Discomfort, Constant, Throbbing, Spasm. Timing: Constant. Modifying factor(s): medications. Vitals:  height is 5\' 8"  (1.727 m) and weight is 190 lb (86.2 kg). His oral temperature is 97.8 F (36.6 C). His blood pressure is 134/79 and his pulse is 71. His respiration is 16 and oxygen saturation is 99%.   Reason for encounter: medication management.  The patient indicates doing well with the current medication regimen. No adverse reactions or side effects reported to the medications.  Today the patient again thanked me for the last procedure that we did for him.  He refers feeling better than he has ever felt.  He is currently under well control with his medication regimen.  He is still having some pain in the right big toe, secondary to chronic infection.  He is pending to have surgery next Monday by Dr. Friday to address this issue.  Because of this, I have provided the patient with the handout for the surgeon on how to manage the postop pain on our chronic pain patients.  In addition, the patient refers that he is pending to see Dr. Allena Katz on  Friday to see what can be done about  his neck.  He is scheduled to have a Covid test tomorrow in preparation for the Monday surgery.  He refers having lost 28 pounds and having cut down on his smoking by about half a pack.  This and the physical therapy that he has done for the neck apparently have helped significantly.  He refers feeling significantly better.  Today we will refill his opioid analgesics and we will transfer the nonopioids to the patient's PCP as I am doing with all of my other patients.  RTCB: 03/02/2020. Cancel 12/24/2019 medication management appointment. Nonopioids transferred 11/21/2019: Lyrica and Robaxin  Post-Procedure Evaluation  Procedure (09/13/2019): Diagnostic right sided superficial peroneal/fibular nerve block under fluoroscopic guidance and IV sedation Pre-procedure pain level: 7/10 Post-procedure: 3/10 (> 50% relief)  Sedation: Sedation provided.  Effectiveness during initial hour after procedure(Ultra-Short Term Relief):   100%.  Local anesthetic used: Long-acting (4-6 hours) Effectiveness: Defined as any analgesic benefit obtained secondary to the administration of local anesthetics. This carries significant diagnostic value as to the etiological location, or anatomical origin, of the pain. Duration of benefit is expected to coincide with the duration of the local anesthetic used.  Effectiveness during initial 4-6 hours after procedure(Short-Term Relief):   100%.  Long-term benefit: Defined as any relief past the pharmacologic duration of the local anesthetics.  Effectiveness past the initial 6 hours after procedure(Long-Term Relief):   100%.  Current benefits: Defined as benefit that persist at this time.               Analgesia:  90-100% better Function: Mr. Joynt reports improvement in function ROM: Mr. Gettel reports improvement in ROM  Pharmacotherapy Assessment   Analgesic: Hydrocodone/APAP 5/325 1 tablet every 6 hours (20 mg/day of hydrocodone).   (Unable to tolerate an oxycodone IR trial due to stomachaches, nausea, headaches, and excessive somnolence.  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   Monitoring: [place] PMP: PDMP reviewed during this encounter.       Pharmacotherapy: No side-effects or adverse reactions reported. Compliance: No problems identified. Effectiveness: Clinically acceptable.  Janett Billow, RN  11/21/2019  9:18 AM  Sign when Signing Visit Nursing Pain Medication Assessment:  Safety precautions to be maintained throughout the outpatient stay will include: orient to surroundings, keep bed in low position, maintain call bell within reach at all times, provide assistance with transfer out of bed and ambulation.  Medication Inspection Compliance: Pill count conducted under aseptic conditions, in front of the patient. Neither the pills nor the bottle was removed from the patient's sight at any time. Once count was completed pills were immediately returned to the patient in their original bottle.  Medication: Hydrocodone/APAP Pill/Patch Count: 50 of 120 pills remain Pill/Patch Appearance: Markings consistent with prescribed medication Bottle Appearance: Standard pharmacy container. Clearly labeled. Filled Date: 10 / 35 / 2021 Last Medication intake:  Today    UDS:  Summary  Date Value Ref Range Status  09/11/2019 Note  Final    Comment:    ==================================================================== [employer] [facility] (MW) ==================================================================== Test                             Result       Flag       Units  Drug Present and Declared for Prescription Verification   Hydrocodone                    1156  EXPECTED   ng/mg creat   Hydromorphone                  500          EXPECTED   ng/mg creat   Dihydrocodeine                 90           EXPECTED   ng/mg creat   Norhydrocodone                 1409          EXPECTED   ng/mg creat    Sources of hydrocodone include scheduled prescription medications.    Hydromorphone, dihydrocodeine and norhydrocodone are expected    metabolites of hydrocodone. Hydromorphone and dihydrocodeine are    also available as scheduled prescription medications.  Drug Present not Declared for Prescription Verification   Tramadol                       370          UNEXPECTED ng/mg creat   O-Desmethyltramadol            545          UNEXPECTED ng/mg creat   N-Desmethyltramadol            48           UNEXPECTED ng/mg creat    Source of tramadol is a prescription medication. O-desmethyltramadol    and N-desmethyltramadol are expected metabolites of tramadol.  ==================================================================== Test                      Result    Flag   Units      Ref Range   Creatinine              176              mg/dL      >=20 ==================================================================== Declared Medications:  The flagging and interpretation on this report are based on the  following declared medications.  Unexpected results may arise from  inaccuracies in the declared medications.   **Note: The testing scope of this panel includes these medications:   Hydrocodone (Norco)   **Note: The testing scope of this panel does not include the  following reported medications:   Acetaminophen (Norco)  Aspirin  Cetirizine (Zyrtec)  Doxycycline  Fluticasone (Flonase)  Hydrochlorothiazide  Hydrocortisone  Isosorbide (Imdur)  Losartan (Cozaar)  Methocarbamol (Robaxin)  Mometasone  Naloxone (Narcan)  Neomycin  Nitroglycerin (Nitrostat)  Polymyxin B (Polymyxin)  Pregabalin (Lyrica)  Rosuvastatin (Crestor)  Supplement  Tadalafil (Cialis)  Topical  Topical Lidocaine (Lidoderm)  Umeclidinium (Anoro)  Vilanterol (Anoro) ==================================================================== For clinical consultation, please call [phone]. ====================================================================      ROS  Constitutional: Denies any fever or chills Gastrointestinal: No reported hemesis, hematochezia, vomiting, or acute GI distress Musculoskeletal: Denies any acute onset joint swelling, redness, loss of ROM, or weakness Neurological: No reported episodes of acute onset apraxia, aphasia, dysarthria, agnosia, amnesia, paralysis, loss of coordination, or loss of consciousness  Medication Review  HYDROcodone-acetaminophen, Melatonin, Turmeric, aspirin EC, cetirizine, fluocinolone, fluticasone, hydrochlorothiazide, isosorbide mononitrate, losartan, methocarbamol, naloxone, nitroGLYCERIN, pregabalin, rosuvastatin, tadalafil, and umeclidinium-vilanterol  History Review  Allergy: Mr. Naeem is allergic to lisinopril and oxycodone. Drug: Mr. Burch  reports no history of drug use. Alcohol:  reports current alcohol use of about 4.0 standard drinks of alcohol per  week. Tobacco:  reports that he has been smoking cigarettes. He has a 38.25 pack-year smoking history. He has never used smokeless tobacco. Social: Mr. Falck  reports that he has been smoking cigarettes. He has a 38.25 pack-year smoking history. He has never used smokeless tobacco. He reports current alcohol use of about 4.0 standard drinks of alcohol per week. He reports that he does not use drugs. Medical:  has a past medical history of Chronic foot pain, right (2015), COPD (chronic obstructive pulmonary disease) ([place]), Coronary artery disease, Emphysema lung ([place]), Family history of adverse reaction to anesthesia, History of kidney stones, Hyperlipidemia, Hypertension, Kidney stone, Leucocytosis, Myocardial infarction ([place]) (2015), and OSA on CPAP. Surgical: Mr. Kiener  has a past surgical history that includes Cardiac catheterization (2015); Lumbar laminectomy (1989, 1999); Cervical fusion (1988, 1998); Liver surgery (2015); Partial colectomy (1990);  Inguinal hernia repair (Bilateral, 1975); Lithotripsy; and Colonoscopy with propofol (N/A, 02/24/2017). Family: family history includes Alzheimer's disease in his paternal grandmother; Alzheimer's disease (age of onset: 68) in his father; Breast cancer in his maternal uncle; CAD in his mother; Dementia in his father; Diabetes in his maternal grandmother; Healthy in his sister; Heart attack in his maternal uncle; Heart failure in his maternal grandmother; Heart failure (age of onset: 24) in his mother; Non-Hodgkin's lymphoma in his sister.  Laboratory Chemistry Profile   Renal Lab Results  Component Value Date   BUN 14 11/20/2019   CREATININE 0.79 11/20/2019   BCR 18 09/20/2019   GFRAA 112 09/20/2019   GFRNONAA >60 11/20/2019     Hepatic Lab Results  Component Value Date   AST 15 09/20/2019   ALT 14 09/20/2019   ALBUMIN 4.1 09/20/2019   ALKPHOS 67 09/20/2019     Electrolytes Lab Results  Component Value Date   NA 135 11/20/2019   K 3.9 11/20/2019   CL 99 11/20/2019   CALCIUM 9.2 11/20/2019   MG 2.0 05/30/2018     Bone Lab Results  Component Value Date   25OHVITD1 32 02/17/2017   25OHVITD2 <1.0 02/17/2017   25OHVITD3 32 02/17/2017   TESTOFREE 5.2 (L) 02/28/2017   TESTOSTERONE 279 02/28/2017     Inflammation (CRP: Acute Phase) (ESR: Chronic Phase) Lab Results  Component Value Date   CRP 8 11/07/2017   ESRSEDRATE 26 11/07/2017       Note: Above Lab results reviewed.  Recent Imaging Review  DG Abd 1 View CLINICAL DATA:  64 year old male with nephrolithiasis in right flank pain  EXAM: ABDOMEN - 1 VIEW  COMPARISON:  10/31/2019, CT 02/09/2018  FINDINGS: Gas within small bowel and colon. No abnormal distention. Small stool burden.  Surgical changes within the mid pelvis.  Calcifications projecting over the left abdomen, unchanged from the prior.  No calcifications in the region the right silhouette or along the expected course of the right ureter.  No  acute displaced fracture.  IMPRESSION: Negative for plain film evidence of right-sided nephrolithiasis.  Unchanged left-sided nephrolithiasis.  Electronically Signed   By: Corrie Mckusick D.O.   On: 11/14/2019 12:08 Note: Reviewed        Physical Exam  General appearance: Well nourished, well developed, and well hydrated. In no apparent acute distress Mental status: Alert, oriented x 3 (person, place, & time)       Respiratory: No evidence of acute respiratory distress Eyes: PERLA Vitals: BP 134/79 (BP Location: Left Arm, Patient Position: Sitting, Cuff Size: Large)   Pulse 71   Temp 97.8 F (36.6 C) (Oral)  Resp 16   Ht $R'5\' 8"'Cb$  (1.727 m)   Wt 190 lb (86.2 kg)   SpO2 99%   BMI 28.89 kg/m  BMI: Estimated body mass index is 28.89 kg/m as calculated from the following:   Height as of this encounter: $RemoveBeforeD'5\' 8"'SduvtIwfYkNdKW$  (1.727 m).   Weight as of this encounter: 190 lb (86.2 kg). Ideal: Ideal body weight: 68.4 kg (150 lb 12.7 oz) Adjusted ideal body weight: 75.5 kg (166 lb 7.6 oz)  Assessment   Status Diagnosis  Controlled Controlled Controlled 1. Chronic pain syndrome   2. Chronic foot pain (1ry area of Pain) (Right)   3. Chronic ankle pain (2ry area of Pain) (Right)   4. Chronic neck pain with history of cervical spinal surgery   5. Chronic neck pain (4th area of Pain) (Bilateral) (L>R)   6. Cervicalgia   7. 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 Abnormal nerve conduction studies (06/08/2017)   9. Pharmacologic therapy   10. Uncomplicated opioid dependence ([facility])   11. Chronic musculoskeletal pain   12. Neurogenic foot pain (Right)   13. 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 Neuropathic pain      Updated Problems: Problem  Cervical Paraspinal Muscle Spasm  Chronic neck pain (4th area of Pain) (Bilateral) (L>R)  Cervicalgia  Uncomplicated Opioid Dependence ([facility])    Plan of Care  Problem-specific:  No problem-specific Assessment & Plan notes  found for this encounter.  Mr. MICKAL MENO has a current medication list which includes the following long-term medication(s): cetirizine, hydrochlorothiazide, isosorbide mononitrate, losartan, nitroglycerin, rosuvastatin, tadalafil, [START ON 01/02/2020] hydrocodone-acetaminophen, [START ON 02/01/2020] hydrocodone-acetaminophen, [START ON 12/06/2019] methocarbamol, and [START ON 12/06/2019] pregabalin.  Pharmacotherapy (Medications Ordered): Meds ordered this encounter  Medications  . methocarbamol (ROBAXIN) 750 MG tablet    Sig: Take 1 tablet (750 mg total) by mouth every 8 (eight) hours as needed for muscle spasms.    Dispense:  90 tablet    Refill:  2    Fill 1 day early if pharmacy is closed on scheduled refill date. Generic permitted. Void old duplicate prescription / refill. Do not send renewal requests.  . pregabalin (LYRICA) 150 MG capsule    Sig: Take 1 capsule (150 mg total) by mouth 3 (three) times daily.    Dispense:  90 capsule    Refill:  2    Fill 1 day early if pharmacy is closed on scheduled refill date. Generic permitted. Void old duplicate prescription / refill. Do not send renewal requests.  Marland Kitchen HYDROcodone-acetaminophen (NORCO) 7.5-325 MG tablet    Sig: Take 1 tablet by mouth every 6 (six) hours as needed for severe pain. Must last 30 days.    Dispense:  120 tablet    Refill:  0    Chronic Pain: STOP Act (Not applicable) Fill 1 day early if closed on refill date. Avoid benzodiazepines within 8 hours of opioids  . HYDROcodone-acetaminophen (NORCO) 7.5-325 MG tablet    Sig: Take 1 tablet by mouth every 6 (six) hours as needed for severe pain. Must last 30 days.    Dispense:  120 tablet    Refill:  0    Chronic Pain: STOP Act (Not applicable) Fill 1 day early if closed on refill date. Avoid benzodiazepines within 8 hours of opioids   Orders:  No orders of the defined types were placed in this encounter.  Follow-up plan:   Return in about 3 months (around 03/02/2020)  for (F2F), (Med Mgmt).  Interventional management options: Considering:   Possible spinal cord stimulator trial  Possible bilateral cervical facet RFA Diagnostic right ankle block Diagnosticright lumbar sympathetic block Diagnostic midlineTESI Diagnostic bilateral thoracic facet block Possible bilateral thoracic facet RFA   Palliative PRN treatment(s):   Palliative left CESI #4 Diagnostic bilateral cervical facet block#3 Palliative/diagnostic right-sidedCommon Peroneal NB#3 Therapeutic right-sided deep peroneal nerve block #2  Palliative right-sided common peroneal nerve RFA #2(last done on 06/21/2017)          Recent Visits Date Type Provider Dept  10/17/19 Telemedicine Milinda Pointer, MD [facility]  09/13/19 Procedure visit Milinda Pointer, MD [facility]  09/04/19 Office Visit Gillis Santa, MD [facility]  Showing recent visits within past 90 days and meeting all other requirements Today's Visits Date Type Provider Dept  11/21/19 Office Visit Milinda Pointer, MD [facility]  Showing today's visits and meeting all other requirements Future Appointments Date Type Provider Dept  12/24/19 Appointment Milinda Pointer, MD [facility]  Showing future appointments within next 90 days and meeting all other requirements  I discussed the assessment and treatment plan with the patient. The patient was provided an opportunity to ask questions and all were answered. The patient agreed with the plan and demonstrated an understanding of the instructions.  Patient advised to call back or seek an in-person evaluation if the symptoms or condition worsens.  Duration of encounter: 30 minutes.  Note by: Gaspar Cola, MD Date: 11/21/2019; Time: 9:53 AM

## 2019-11-21 ENCOUNTER — Other Ambulatory Visit: Payer: Self-pay

## 2019-11-21 ENCOUNTER — Ambulatory Visit: Payer: Medicaid Other | Attending: Pain Medicine | Admitting: Pain Medicine

## 2019-11-21 ENCOUNTER — Encounter: Payer: Self-pay | Admitting: Pain Medicine

## 2019-11-21 ENCOUNTER — Encounter (HOSPITAL_COMMUNITY): Payer: Self-pay

## 2019-11-21 VITALS — BP 134/79 | HR 71 | Temp 97.8°F | Resp 16 | Ht 68.0 in | Wt 190.0 lb

## 2019-11-21 DIAGNOSIS — M792 Neuralgia and neuritis, unspecified: Secondary | ICD-10-CM | POA: Insufficient documentation

## 2019-11-21 DIAGNOSIS — G8928 Other chronic postprocedural pain: Secondary | ICD-10-CM

## 2019-11-21 DIAGNOSIS — F1120 Opioid dependence, uncomplicated: Secondary | ICD-10-CM | POA: Diagnosis present

## 2019-11-21 DIAGNOSIS — M79671 Pain in right foot: Secondary | ICD-10-CM

## 2019-11-21 DIAGNOSIS — M25571 Pain in right ankle and joints of right foot: Secondary | ICD-10-CM | POA: Insufficient documentation

## 2019-11-21 DIAGNOSIS — R94130 Abnormal response to nerve stimulation, unspecified: Secondary | ICD-10-CM | POA: Diagnosis not present

## 2019-11-21 DIAGNOSIS — M542 Cervicalgia: Secondary | ICD-10-CM | POA: Insufficient documentation

## 2019-11-21 DIAGNOSIS — M7918 Myalgia, other site: Secondary | ICD-10-CM | POA: Diagnosis not present

## 2019-11-21 DIAGNOSIS — S8411XS Injury of peroneal nerve at lower leg level, right leg, sequela: Secondary | ICD-10-CM | POA: Insufficient documentation

## 2019-11-21 DIAGNOSIS — Z79899 Other long term (current) drug therapy: Secondary | ICD-10-CM | POA: Insufficient documentation

## 2019-11-21 DIAGNOSIS — M5412 Radiculopathy, cervical region: Secondary | ICD-10-CM | POA: Diagnosis not present

## 2019-11-21 DIAGNOSIS — G894 Chronic pain syndrome: Secondary | ICD-10-CM | POA: Diagnosis not present

## 2019-11-21 DIAGNOSIS — G8929 Other chronic pain: Secondary | ICD-10-CM | POA: Insufficient documentation

## 2019-11-21 DIAGNOSIS — Z9889 Other specified postprocedural states: Secondary | ICD-10-CM | POA: Diagnosis not present

## 2019-11-21 MED ORDER — METHOCARBAMOL 750 MG PO TABS: 750.0000 mg | ORAL_TABLET | Freq: Three times a day (TID) | ORAL | 2 refills | Status: DC | PRN

## 2019-11-21 MED ORDER — PREGABALIN 150 MG PO CAPS: 150.0000 mg | ORAL_CAPSULE | Freq: Three times a day (TID) | ORAL | 2 refills | Status: DC

## 2019-11-21 MED ORDER — HYDROCODONE-ACETAMINOPHEN 7.5-325 MG PO TABS: 1.0000 | ORAL_TABLET | Freq: Four times a day (QID) | ORAL | 0 refills | Status: DC | PRN

## 2019-11-21 NOTE — Anesthesia Preprocedure Evaluation (Addendum)
Anesthesia Evaluation  Patient identified by MRN, date of birth, ID band Patient awake    Reviewed: Allergy & Precautions, NPO status , Patient's Chart, lab work & pertinent test results  Airway Mallampati: III  TM Distance: >3 FB Neck ROM: Full    Dental no notable dental hx. (+) Dental Advisory Given   Pulmonary sleep apnea and Continuous Positive Airway Pressure Ventilation , Current SmokerPatient did not abstain from smoking.,    Pulmonary exam normal        Cardiovascular hypertension, Pt. on medications + CAD and + Cardiac Stents  Normal cardiovascular exam  Echo 04/13/18: IMPRESSIONS  1. The left ventricle has low normal systolic function, with an ejection  fraction of 50-55%. The cavity size was mildly dilated.Hypokinesis of the  basal to mid inferior wall and inferoapical region. Left ventricular  diastolic Doppler parameters are  consistent with impaired relaxation.  2. The right ventricle has normal systolic function. The cavity was  normal. There is no increase in right ventricular wall thickness.Unable to  estimate RVSP.  3. Mild mitral valve regurgitation   Cardiac cath 05/29/15 [facility]; scanned under Media tab): Findings: The left main divides into an LAD and circumflex.  There is no significant disease present in the left main.   The circumflex is codominant.  There is a stent present in its mid segment.  The ostial circumflex had luminal irregularities.  It gives off a small first marginal, a large second marginal, and multiple small left posterior lateral branches.  The distal circumflex is 30 to 40% stenosis.  There is an area of recent stenosis in the mid circumflex of approximately 30 to 40%.  There is no flow-limiting disease present in the circumflex.   The LAD courses to the apex, gives off multiple septal branches and a small to medium size first diagonal.  The LAD and its  branches have no significant disease present.  The septals do provide some collaterals, left to right to a small RPDA.   Right coronary artery is small and occluded in its distal segment.  It gives off multiple marginal branches.  The distal RPDA does fill via left to right collaterals. The left ventricular end-diastolic pressure ranged from 18 to 22 mmHg.  There is no gradient upon pullback across aortic valve. Impression: Mild coronary atherosclerosis of the LAD and circumflex with patent stent in the mid circumflex.  CT of the right coronary artery with left to right collaterals to a small RPDA.  LVEDP 18 to 22 mmHg.  Recommend continue medical therapy.    Neuro/Psych PSYCHIATRIC DISORDERS Anxiety Depression negative neurological ROS     GI/Hepatic negative GI ROS, Neg liver ROS,   Endo/Other  negative endocrine ROS  Renal/GU negative Renal ROS     Musculoskeletal negative musculoskeletal ROS (+)   Abdominal   Peds  Hematology negative hematology ROS (+)   Anesthesia Other Findings   Reproductive/Obstetrics                           Anesthesia Physical Anesthesia Plan  ASA: III  Anesthesia Plan: MAC   Post-op Pain Management:    Induction:   PONV Risk Score and Plan: 0 and Propofol infusion  Airway Management Planned: Natural Airway  Additional Equipment:   Intra-op Plan:   Post-operative Plan:   Informed Consent: I have reviewed the patients History and Physical, chart, labs and discussed the procedure including the risks, benefits  and alternatives for the proposed anesthesia with the patient or authorized representative who has indicated his/her understanding and acceptance.       Plan Discussed with: Anesthesiologist, CRNA and Surgeon  Anesthesia Plan Comments: (PAT note written 11/21/2019 by Myra Gianotti, PA-C. )      Anesthesia Quick Evaluation

## 2019-11-21 NOTE — Patient Instructions (Signed)
____________________________________________________________________________________________  CBD (cannabidiol) WARNING  Applicable to: All individuals currently taking or considering taking CBD (cannabidiol) and, more important, all patients taking opioid analgesic controlled substances (pain medication). (Example: oxycodone; oxymorphone; hydrocodone; hydromorphone; morphine; methadone; tramadol; tapentadol; fentanyl; buprenorphine; butorphanol; dextromethorphan; meperidine; codeine; etc.)  Legal status: CBD remains a Schedule I drug prohibited for any use. CBD is illegal with one exception. In the [place], CBD has a limited Food and Drug Administration (FDA) approval for the treatment of two specific types of epilepsy disorders. Only one CBD product has been approved by the FDA for this purpose: "Epidiolex". FDA is aware that some companies are marketing products containing cannabis and cannabis-derived compounds in ways that violate the Federal Food, Drug and Cosmetic Act (FD&C Act) and that may put the health and safety of consumers at risk. The FDA, a Federal agency, has not enforced the CBD status since 2018.   Legality: Some manufacturers ship CBD products nationally, which is illegal. Often such products are sold online and are therefore available throughout the country. CBD is openly sold in head shops and health food stores in some states where such sales have not been explicitly legalized. Selling unapproved products with unsubstantiated therapeutic claims is not only a violation of the law, but also can put patients at risk, as these products have not been proven to be safe or effective. Federal illegality makes it difficult to conduct research on CBD.  Reference: "FDA Regulation of Cannabis and Cannabis-Derived Products, Including Cannabidiol (CBD)" -  https://www.fda.gov/news-events/public-health-focus/fda-regulation-cannabis-and-cannabis-derived-products-including-cannabidiol-cbd  Warning: CBD is not FDA approved and has not undergo the same manufacturing controls as prescription drugs.  This means that the purity and safety of available CBD may be questionable. Most of the time, despite manufacturer's claims, it is contaminated with THC (delta-9-tetrahydrocannabinol - the chemical in marijuana responsible for the "HIGH").  When this is the case, the THC contaminant will trigger a positive urine drug screen (UDS) test for Marijuana (carboxy-THC). Because a positive UDS for any illicit substance is a violation of our medication agreement, your opioid analgesics (pain medicine) may be permanently discontinued.  MORE ABOUT CBD  General Information: CBD  is a derivative of the Marijuana (cannabis sativa) plant discovered in 1940. It is one of the 113 identified substances found in Marijuana. It accounts for up to 40% of the plant's extract. As of 2018, preliminary clinical studies on CBD included research for the treatment of anxiety, movement disorders, and pain. CBD is available and consumed in multiple forms, including inhalation of smoke or vapor, as an aerosol spray, and by mouth. It may be supplied as an oil containing CBD, capsules, dried cannabis, or as a liquid solution. CBD is thought not to be as psychoactive as THC (delta-9-tetrahydrocannabinol - the chemical in marijuana responsible for the "HIGH"). Studies suggest that CBD may interact with different biological target receptors in the body, including cannabinoid and other neurotransmitter receptors. As of 2018 the mechanism of action for its biological effects has not been determined.  Side-effects  Adverse reactions: Dry mouth, diarrhea, decreased appetite, fatigue, drowsiness, malaise, weakness, sleep disturbances, and others.  Drug interactions: CBC may interact with other medications  such as blood-thinners. (Last update: 08/11/2019) ____________________________________________________________________________________________   ____________________________________________________________________________________________  Medication Rules  Purpose: To inform patients, and their family members, of our rules and regulations.  Applies to: All patients receiving prescriptions (written or electronic).  Pharmacy of record: Pharmacy where electronic prescriptions will be sent. If written prescriptions are taken to a different pharmacy, please inform   the nursing staff. The pharmacy listed in the electronic medical record should be the one where you would like electronic prescriptions to be sent.  Electronic prescriptions: In compliance with the [place] Strengthen Opioid Misuse Prevention (STOP) Act of 2017 (Session Law 2017-74/H243), effective January 04, 2018, all controlled substances must be electronically prescribed. Calling prescriptions to the pharmacy will cease to exist.  Prescription refills: Only during scheduled appointments. Applies to all prescriptions.  NOTE: The following applies primarily to controlled substances (Opioid* Pain Medications).   Type of encounter (visit): For patients receiving controlled substances, face-to-face visits are required. (Not an option or up to the patient.)  Patient's responsibilities: 1. Pain Pills: Bring all pain pills to every appointment (except for procedure appointments). 2. Pill Bottles: Bring pills in original pharmacy bottle. Always bring the newest bottle. Bring bottle, even if empty. 3. Medication refills: You are responsible for knowing and keeping track of what medications you take and those you need refilled. The day before your appointment: write a list of all prescriptions that need to be refilled. The day of the appointment: give the list to the admitting nurse. Prescriptions will be written only during  appointments. No prescriptions will be written on procedure days. If you forget a medication: it will not be "Called in", "Faxed", or "electronically sent". You will need to get another appointment to get these prescribed. No early refills. Do not call asking to have your prescription filled early. 4. Prescription Accuracy: You are responsible for carefully inspecting your prescriptions before leaving our office. Have the discharge nurse carefully go over each prescription with you, before taking them home. Make sure that your name is accurately spelled, that your address is correct. Check the name and dose of your medication to make sure it is accurate. Check the number of pills, and the written instructions to make sure they are clear and accurate. Make sure that you are given enough medication to last until your next medication refill appointment. 5. Taking Medication: Take medication as prescribed. When it comes to controlled substances, taking less pills or less frequently than prescribed is permitted and encouraged. Never take more pills than instructed. Never take medication more frequently than prescribed.  6. Inform other Doctors: Always inform, all of your healthcare providers, of all the medications you take. 7. Pain Medication from other Providers: You are not allowed to accept any additional pain medication from any other Doctor or Healthcare provider. There are two exceptions to this rule. (see below) In the event that you require additional pain medication, you are responsible for notifying us, as stated below. 8. Medication Agreement: You are responsible for carefully reading and following our Medication Agreement. This must be signed before receiving any prescriptions from our practice. Safely store a copy of your signed Agreement. Violations to the Agreement will result in no further prescriptions. (Additional copies of our Medication Agreement are available upon request.) 9. Laws, Rules,  & Regulations: All patients are expected to follow all Federal and State Laws, Statutes, Rules, & Regulations. Ignorance of the Laws does not constitute a valid excuse.  10. Illegal drugs and Controlled Substances: The use of illegal substances (including, but not limited to marijuana and its derivatives) and/or the illegal use of any controlled substances is strictly prohibited. Violation of this rule may result in the immediate and permanent discontinuation of any and all prescriptions being written by our practice. The use of any illegal substances is prohibited. 11. Adopted CDC guidelines & recommendations: Target dosing   levels will be at or below 60 MME/day. Use of benzodiazepines** is not recommended.  Exceptions: There are only two exceptions to the rule of not receiving pain medications from other Healthcare Providers. 1. Exception #1 (Emergencies): In the event of an emergency (i.e.: accident requiring emergency care), you are allowed to receive additional pain medication. However, you are responsible for: As soon as you are able, call our office [phone], at any time of the day or night, and leave a message stating your name, the date and nature of the emergency, and the name and dose of the medication prescribed. In the event that your call is answered by a member of our staff, make sure to document and save the date, time, and the name of the person that took your information.  2. Exception #2 (Planned Surgery): In the event that you are scheduled by another doctor or dentist to have any type of surgery or procedure, you are allowed (for a period no longer than 30 days), to receive additional pain medication, for the acute post-op pain. However, in this case, you are responsible for picking up a copy of our "Post-op Pain Management for Surgeons" handout, and giving it to your surgeon or dentist. This document is available at our office, and does not require an appointment to obtain it. Simply  go to our office during business hours (Monday-Thursday from 8:00 AM to 4:00 PM) (Friday 8:00 AM to 12:00 Noon) or if you have a scheduled appointment with us, prior to your surgery, and ask for it by name. In addition, you are responsible for: calling our office [phone], at any time of the day or night, and leaving a message stating your name, name of your surgeon, type of surgery, and date of procedure or surgery. Failure to comply with your responsibilities may result in termination of therapy involving the controlled substances.  *Opioid medications include: morphine, codeine, oxycodone, oxymorphone, hydrocodone, hydromorphone, meperidine, tramadol, tapentadol, buprenorphine, fentanyl, methadone. **Benzodiazepine medications include: diazepam (Valium), alprazolam (Xanax), clonazepam (Klonopine), lorazepam (Ativan), clorazepate (Tranxene), chlordiazepoxide (Librium), estazolam (Prosom), oxazepam (Serax), temazepam (Restoril), triazolam (Halcion) (Last updated: 09/11/2019) ____________________________________________________________________________________________   ____________________________________________________________________________________________  Medication Recommendations and Reminders  Applies to: All patients receiving prescriptions (written and/or electronic).  Medication Rules & Regulations: These rules and regulations exist for your safety and that of others. They are not flexible and neither are we. Dismissing or ignoring them will be considered "non-compliance" with medication therapy, resulting in complete and irreversible termination of such therapy. (See document titled "Medication Rules" for more details.) In all conscience, because of safety reasons, we cannot continue providing a therapy where the patient does not follow instructions.  Pharmacy of record:   Definition: This is the pharmacy where your electronic prescriptions will be sent.   We do not endorse any  particular pharmacy, however, we have experienced problems with [facility] not securing enough medication supply for the community.  We do not restrict you in your choice of pharmacy. However, once we write for your prescriptions, we will NOT be re-sending more prescriptions to fix restricted supply problems created by your pharmacy, or your insurance.   The pharmacy listed in the electronic medical record should be the one where you want electronic prescriptions to be sent.  If you choose to change pharmacy, simply notify our nursing staff.  Recommendations:  Keep all of your pain medications in a safe place, under lock and key, even if you live alone. We will NOT replace lost, stolen, or   damaged medication.  After you fill your prescription, take 1 week's worth of pills and put them away in a safe place. You should keep a separate, properly labeled bottle for this purpose. The remainder should be kept in the original bottle. Use this as your primary supply, until it runs out. Once it's gone, then you know that you have 1 week's worth of medicine, and it is time to come in for a prescription refill. If you do this correctly, it is unlikely that you will ever run out of medicine.  To make sure that the above recommendation works, it is very important that you make sure your medication refill appointments are scheduled at least 1 week before you run out of medicine. To do this in an effective manner, make sure that you do not leave the office without scheduling your next medication management appointment. Always ask the nursing staff to show you in your prescription , when your medication will be running out. Then arrange for the receptionist to get you a return appointment, at least 7 days before you run out of medicine. Do not wait until you have 1 or 2 pills left, to come in. This is very poor planning and does not take into consideration that we may need to cancel appointments due to bad weather,  sickness, or emergencies affecting our staff.  DO NOT ACCEPT A "Partial Fill": If for any reason your pharmacy does not have enough pills/tablets to completely fill or refill your prescription, do not allow for a "partial fill". The law allows the pharmacy to complete that prescription within 72 hours, without requiring a new prescription. If they do not fill the rest of your prescription within those 72 hours, you will need a separate prescription to fill the remaining amount, which we will NOT provide. If the reason for the partial fill is your insurance, you will need to talk to the pharmacist about payment alternatives for the remaining tablets, but again, DO NOT ACCEPT A PARTIAL FILL, unless you can trust your pharmacist to obtain the remainder of the pills within 72 hours.  Prescription refills and/or changes in medication(s):   Prescription refills, and/or changes in dose or medication, will be conducted only during scheduled medication management appointments. (Applies to both, written and electronic prescriptions.)  No refills on procedure days. No medication will be changed or started on procedure days. No changes, adjustments, and/or refills will be conducted on a procedure day. Doing so will interfere with the diagnostic portion of the procedure.  No phone refills. No medications will be "called into the pharmacy".  No Fax refills.  No weekend refills.  No Holliday refills.  No after hours refills.  Remember:  Business hours are:  Monday to Thursday 8:00 AM to 4:00 PM Provider's Schedule: Mashanda Ishibashi, MD - Appointments are:  Medication management: Monday and Wednesday 8:00 AM to 4:00 PM Procedure day: Tuesday and Thursday 7:30 AM to 4:00 PM Bilal Lateef, MD - Appointments are:  Medication management: Tuesday and Thursday 8:00 AM to 4:00 PM Procedure day: Monday and Wednesday 7:30 AM to 4:00 PM (Last update:  07/25/2019) ____________________________________________________________________________________________    

## 2019-11-21 NOTE — Progress Notes (Signed)
Nursing Pain Medication Assessment:  Safety precautions to be maintained throughout the outpatient stay will include: orient to surroundings, keep bed in low position, maintain call bell within reach at all times, provide assistance with transfer out of bed and ambulation.  Medication Inspection Compliance: Pill count conducted under aseptic conditions, in front of the patient. Neither the pills nor the bottle was removed from the patient's sight at any time. Once count was completed pills were immediately returned to the patient in their original bottle.  Medication: Hydrocodone/APAP Pill/Patch Count: 50 of 120 pills remain Pill/Patch Appearance: Markings consistent with prescribed medication Bottle Appearance: Standard pharmacy container. Clearly labeled. Filled Date: 10 / 90 / 2021 Last Medication intake:  Today
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
## 2019-11-22 ENCOUNTER — Other Ambulatory Visit
Admission: RE | Admit: 2019-11-22 | Discharge: 2019-11-22 | Disposition: A | Discharge status: Home / Self Care | Payer: Medicaid Other | Source: Ambulatory Visit | Attending: Podiatry | Admitting: Podiatry

## 2019-11-22 DIAGNOSIS — Z20822 Contact with and (suspected) exposure to covid-19: Secondary | ICD-10-CM | POA: Diagnosis not present

## 2019-11-22 DIAGNOSIS — M542 Cervicalgia: Secondary | ICD-10-CM | POA: Diagnosis not present

## 2019-11-22 DIAGNOSIS — Z01812 Encounter for preprocedural laboratory examination: Secondary | ICD-10-CM | POA: Diagnosis not present

## 2019-11-23 LAB — SARS CORONAVIRUS 2 (TAT 6-24 HRS): SARS Coronavirus 2: NEGATIVE

## 2019-11-26 ENCOUNTER — Encounter: Payer: Medicaid Other | Admitting: Pain Medicine

## 2019-11-26 ENCOUNTER — Ambulatory Visit (HOSPITAL_COMMUNITY): Payer: Medicaid Other | Admitting: Anesthesiology

## 2019-11-26 ENCOUNTER — Ambulatory Visit (HOSPITAL_COMMUNITY)
Admission: RE | Admit: 2019-11-26 | Discharge: 2019-11-26 | Disposition: A | Discharge status: Home / Self Care | Payer: Medicaid Other | Attending: Podiatry | Admitting: Podiatry

## 2019-11-26 ENCOUNTER — Ambulatory Visit (HOSPITAL_COMMUNITY): Payer: Medicaid Other | Admitting: Vascular Surgery

## 2019-11-26 ENCOUNTER — Other Ambulatory Visit: Payer: Self-pay

## 2019-11-26 ENCOUNTER — Ambulatory Visit (HOSPITAL_COMMUNITY): Payer: Medicaid Other

## 2019-11-26 ENCOUNTER — Encounter (HOSPITAL_COMMUNITY)
Admission: RE | Disposition: A | Discharge status: Home / Self Care | Payer: Self-pay | Source: Home / Self Care | Attending: Podiatry

## 2019-11-26 ENCOUNTER — Encounter (HOSPITAL_COMMUNITY): Payer: Self-pay | Admitting: Podiatry

## 2019-11-26 DIAGNOSIS — I10 Essential (primary) hypertension: Secondary | ICD-10-CM | POA: Diagnosis not present

## 2019-11-26 DIAGNOSIS — F17210 Nicotine dependence, cigarettes, uncomplicated: Secondary | ICD-10-CM | POA: Insufficient documentation

## 2019-11-26 DIAGNOSIS — Z888 Allergy status to other drugs, medicaments and biological substances status: Secondary | ICD-10-CM | POA: Diagnosis not present

## 2019-11-26 DIAGNOSIS — Z885 Allergy status to narcotic agent status: Secondary | ICD-10-CM | POA: Insufficient documentation

## 2019-11-26 DIAGNOSIS — M19071 Primary osteoarthritis, right ankle and foot: Secondary | ICD-10-CM | POA: Diagnosis not present

## 2019-11-26 DIAGNOSIS — D1723 Benign lipomatous neoplasm of skin and subcutaneous tissue of right leg: Secondary | ICD-10-CM | POA: Diagnosis not present

## 2019-11-26 DIAGNOSIS — L97519 Non-pressure chronic ulcer of other part of right foot with unspecified severity: Secondary | ICD-10-CM | POA: Insufficient documentation

## 2019-11-26 DIAGNOSIS — M2459 Contracture, other specified joint: Secondary | ICD-10-CM | POA: Diagnosis not present

## 2019-11-26 DIAGNOSIS — I2510 Atherosclerotic heart disease of native coronary artery without angina pectoris: Secondary | ICD-10-CM | POA: Diagnosis not present

## 2019-11-26 DIAGNOSIS — Z7982 Long term (current) use of aspirin: Secondary | ICD-10-CM | POA: Insufficient documentation

## 2019-11-26 DIAGNOSIS — Z978 Presence of other specified devices: Secondary | ICD-10-CM

## 2019-11-26 DIAGNOSIS — Z79899 Other long term (current) drug therapy: Secondary | ICD-10-CM | POA: Insufficient documentation

## 2019-11-26 DIAGNOSIS — Z981 Arthrodesis status: Secondary | ICD-10-CM | POA: Diagnosis not present

## 2019-11-26 HISTORY — PX: LESION REMOVAL: SHX5196

## 2019-11-26 HISTORY — PX: ARTHRODESIS METATARSAL: SHX6565

## 2019-11-26 SURGERY — FUSION, JOINT, INVOLVING METATARSAL BONE
Anesthesia: Monitor Anesthesia Care | Site: Toe | Laterality: Right

## 2019-11-26 MED ORDER — LACTATED RINGERS IV SOLN: INTRAVENOUS | Status: DC

## 2019-11-26 MED ORDER — MIDAZOLAM HCL 2 MG/2ML IJ SOLN
INTRAMUSCULAR | Status: AC
  Filled 2019-11-26: qty 2

## 2019-11-26 MED ORDER — ONDANSETRON HCL 4 MG/2ML IJ SOLN
INTRAMUSCULAR | Status: DC | PRN
  Administered 2019-11-26: 4 mg via INTRAVENOUS

## 2019-11-26 MED ORDER — BUPIVACAINE HCL (PF) 0.5 % IJ SOLN
INTRAMUSCULAR | Status: AC
  Filled 2019-11-26: qty 30

## 2019-11-26 MED ORDER — EPHEDRINE SULFATE-NACL 50-0.9 MG/10ML-% IV SOSY
PREFILLED_SYRINGE | INTRAVENOUS | Status: DC | PRN
  Administered 2019-11-26 (×2): 5 mg via INTRAVENOUS
  Administered 2019-11-26: 10 mg via INTRAVENOUS

## 2019-11-26 MED ORDER — BUPIVACAINE HCL (PF) 0.5 % IJ SOLN
INTRAMUSCULAR | Status: DC | PRN
  Administered 2019-11-26: 5 mL

## 2019-11-26 MED ORDER — CEFAZOLIN SODIUM-DEXTROSE 2-3 GM-%(50ML) IV SOLR
INTRAVENOUS | Status: DC | PRN
  Administered 2019-11-26: 2 g via INTRAVENOUS

## 2019-11-26 MED ORDER — PHENYLEPHRINE 40 MCG/ML (10ML) SYRINGE FOR IV PUSH (FOR BLOOD PRESSURE SUPPORT)
PREFILLED_SYRINGE | INTRAVENOUS | Status: AC
  Filled 2019-11-26: qty 10

## 2019-11-26 MED ORDER — HYDROCODONE-ACETAMINOPHEN 10-325 MG PO TABS
1.0000 | ORAL_TABLET | Freq: Three times a day (TID) | ORAL | 0 refills | Status: DC | PRN
Start: 2019-11-26 — End: 2020-01-07

## 2019-11-26 MED ORDER — MIDAZOLAM HCL 5 MG/5ML IJ SOLN
INTRAMUSCULAR | Status: DC | PRN
  Administered 2019-11-26: 2 mg via INTRAVENOUS

## 2019-11-26 MED ORDER — FENTANYL CITRATE (PF) 250 MCG/5ML IJ SOLN
INTRAMUSCULAR | Status: DC | PRN
  Administered 2019-11-26: 50 ug via INTRAVENOUS

## 2019-11-26 MED ORDER — ONDANSETRON HCL 4 MG/2ML IJ SOLN
INTRAMUSCULAR | Status: AC
  Filled 2019-11-26: qty 2

## 2019-11-26 MED ORDER — PROPOFOL 10 MG/ML IV BOLUS
INTRAVENOUS | Status: AC
  Filled 2019-11-26: qty 20

## 2019-11-26 MED ORDER — CEFAZOLIN SODIUM 1 G IJ SOLR
INTRAMUSCULAR | Status: AC
  Filled 2019-11-26: qty 20

## 2019-11-26 MED ORDER — PHENYLEPHRINE 40 MCG/ML (10ML) SYRINGE FOR IV PUSH (FOR BLOOD PRESSURE SUPPORT)
PREFILLED_SYRINGE | INTRAVENOUS | Status: DC | PRN
  Administered 2019-11-26 (×2): 80 ug via INTRAVENOUS
  Administered 2019-11-26: 120 ug via INTRAVENOUS
  Administered 2019-11-26: 40 ug via INTRAVENOUS
  Administered 2019-11-26 (×4): 80 ug via INTRAVENOUS

## 2019-11-26 MED ORDER — CELECOXIB 200 MG PO CAPS
200.0000 mg | ORAL_CAPSULE | Freq: Once | ORAL | Status: AC
  Administered 2019-11-26: 200 mg via ORAL
  Filled 2019-11-26: qty 1

## 2019-11-26 MED ORDER — IBUPROFEN 800 MG PO TABS: 800.0000 mg | ORAL_TABLET | Freq: Four times a day (QID) | ORAL | 1 refills | Status: DC | PRN

## 2019-11-26 MED ORDER — GLYCOPYRROLATE PF 0.2 MG/ML IJ SOSY
PREFILLED_SYRINGE | INTRAMUSCULAR | Status: AC
  Filled 2019-11-26: qty 1

## 2019-11-26 MED ORDER — LIDOCAINE HCL 2 % IJ SOLN
INTRAMUSCULAR | Status: AC
  Filled 2019-11-26: qty 20

## 2019-11-26 MED ORDER — ACETAMINOPHEN 500 MG PO TABS
1000.0000 mg | ORAL_TABLET | Freq: Once | ORAL | Status: AC
  Administered 2019-11-26: 1000 mg via ORAL
  Filled 2019-11-26: qty 2

## 2019-11-26 MED ORDER — PROPOFOL 10 MG/ML IV BOLUS
INTRAVENOUS | Status: DC | PRN
  Administered 2019-11-26 (×2): 50 mg via INTRAVENOUS

## 2019-11-26 MED ORDER — DEXAMETHASONE SODIUM PHOSPHATE 10 MG/ML IJ SOLN
INTRAMUSCULAR | Status: DC | PRN
  Administered 2019-11-26: 10 mg via INTRAVENOUS

## 2019-11-26 MED ORDER — FENTANYL CITRATE (PF) 100 MCG/2ML IJ SOLN
25.0000 ug | INTRAMUSCULAR | Status: DC | PRN
  Administered 2019-11-26: 50 ug via INTRAVENOUS

## 2019-11-26 MED ORDER — CHLORHEXIDINE GLUCONATE 0.12 % MT SOLN
15.0000 mL | Freq: Once | OROMUCOSAL | Status: AC
  Administered 2019-11-26: 15 mL via OROMUCOSAL
  Filled 2019-11-26: qty 15

## 2019-11-26 MED ORDER — DEXAMETHASONE SODIUM PHOSPHATE 10 MG/ML IJ SOLN
INTRAMUSCULAR | Status: AC
  Filled 2019-11-26: qty 1

## 2019-11-26 MED ORDER — ORAL CARE MOUTH RINSE: 15.0000 mL | Freq: Once | OROMUCOSAL | Status: AC

## 2019-11-26 MED ORDER — EPHEDRINE 5 MG/ML INJ
INTRAVENOUS | Status: AC
  Filled 2019-11-26: qty 10

## 2019-11-26 MED ORDER — PROPOFOL 500 MG/50ML IV EMUL
INTRAVENOUS | Status: DC | PRN
  Administered 2019-11-26: 50 ug/kg/min via INTRAVENOUS

## 2019-11-26 MED ORDER — PROPOFOL 1000 MG/100ML IV EMUL
INTRAVENOUS | Status: AC
  Filled 2019-11-26: qty 100

## 2019-11-26 MED ORDER — LIDOCAINE HCL (PF) 2 % IJ SOLN
INTRAMUSCULAR | Status: AC
  Filled 2019-11-26: qty 5

## 2019-11-26 MED ORDER — FENTANYL CITRATE (PF) 250 MCG/5ML IJ SOLN
INTRAMUSCULAR | Status: AC
  Filled 2019-11-26: qty 5

## 2019-11-26 MED ORDER — FENTANYL CITRATE (PF) 100 MCG/2ML IJ SOLN
INTRAMUSCULAR | Status: AC
  Filled 2019-11-26: qty 2

## 2019-11-26 MED ORDER — LIDOCAINE HCL 2 % IJ SOLN
INTRAMUSCULAR | Status: DC | PRN
  Administered 2019-11-26: 5 mL

## 2019-11-26 MED ORDER — PROMETHAZINE HCL 25 MG/ML IJ SOLN: 6.2500 mg | INTRAMUSCULAR | Status: DC | PRN

## 2019-11-26 MED ORDER — LIDOCAINE 2% (20 MG/ML) 5 ML SYRINGE
INTRAMUSCULAR | Status: DC | PRN
  Administered 2019-11-26 (×2): 40 mg via INTRAVENOUS

## 2019-11-26 MED ORDER — GLYCOPYRROLATE PF 0.2 MG/ML IJ SOSY
PREFILLED_SYRINGE | INTRAMUSCULAR | Status: DC | PRN
  Administered 2019-11-26: .2 mg via INTRAVENOUS

## 2019-11-26 SURGICAL SUPPLY — 52 items
BIT DRILL LONG ACUTRAK2 4.7 (BIT) ×2 IMPLANT
BLADE OSCILLATING/SAGITTAL (BLADE) ×4
BLADE SURG 10 STRL SS (BLADE) ×4 IMPLANT
BLADE SURG 15 STRL LF DISP TIS (BLADE) ×8 IMPLANT
BLADE SURG 15 STRL SS (BLADE) ×16
BLADE SW THK.38XMED NAR THN (BLADE) ×2 IMPLANT
BNDG CMPR 9X4 STRL LF SNTH (GAUZE/BANDAGES/DRESSINGS) ×2
BNDG ELASTIC 4X5.8 VLCR STR LF (GAUZE/BANDAGES/DRESSINGS) ×8 IMPLANT
BNDG ESMARK 4X9 LF (GAUZE/BANDAGES/DRESSINGS) ×4 IMPLANT
BNDG GAUZE ELAST 4 BULKY (GAUZE/BANDAGES/DRESSINGS) ×4 IMPLANT
COVER WAND RF STERILE (DRAPES) ×4 IMPLANT
CUFF TOURN SGL QUICK 18X4 (TOURNIQUET CUFF) ×4 IMPLANT
CUFF TOURN SGL QUICK 24 (TOURNIQUET CUFF)
CUFF TRNQT CYL 24X4X16.5-23 (TOURNIQUET CUFF) IMPLANT
DRAPE EXTREMITY T 121X128X90 (DISPOSABLE) ×4 IMPLANT
DRAPE OEC MINIVIEW 54X84 (DRAPES) ×4 IMPLANT
DRAPE U-SHAPE 47X51 STRL (DRAPES) ×4 IMPLANT
DRILL LONG ACUTRAK2 4.7 (BIT) ×4
ELECT REM PT RETURN 9FT ADLT (ELECTROSURGICAL) ×4
ELECTRODE REM PT RTRN 9FT ADLT (ELECTROSURGICAL) ×2 IMPLANT
GAUZE SPONGE 4X4 12PLY STRL (GAUZE/BANDAGES/DRESSINGS) ×4 IMPLANT
GAUZE XEROFORM 1X8 LF (GAUZE/BANDAGES/DRESSINGS) ×4 IMPLANT
GLOVE BIO SURGEON STRL SZ8 (GLOVE) IMPLANT
GLOVE BIOGEL PI IND STRL 7.0 (GLOVE) ×2 IMPLANT
GLOVE BIOGEL PI IND STRL 8 (GLOVE) IMPLANT
GLOVE BIOGEL PI INDICATOR 7.0 (GLOVE) ×2
GLOVE BIOGEL PI INDICATOR 8 (GLOVE)
GOWN STRL REUS W/ TWL LRG LVL3 (GOWN DISPOSABLE) ×2 IMPLANT
GOWN STRL REUS W/ TWL XL LVL3 (GOWN DISPOSABLE) ×2 IMPLANT
GOWN STRL REUS W/TWL LRG LVL3 (GOWN DISPOSABLE) ×4
GOWN STRL REUS W/TWL XL LVL3 (GOWN DISPOSABLE) ×4
KIT BASIN OR (CUSTOM PROCEDURE TRAY) ×4 IMPLANT
NEEDLE 18GX1X1/2 (RX/OR ONLY) (NEEDLE) IMPLANT
NEEDLE HYPO 25X1 1.5 SAFETY (NEEDLE) ×4 IMPLANT
NS IRRIG 1000ML POUR BTL (IV SOLUTION) ×4 IMPLANT
PACK ORTHO EXTREMITY (CUSTOM PROCEDURE TRAY) ×4 IMPLANT
PADDING CAST ABS 4INX4YD NS (CAST SUPPLIES) ×4
PADDING CAST ABS COTTON 4X4 ST (CAST SUPPLIES) ×4 IMPLANT
PUTTY DBM STAGRAFT PLUS 2CC (Putty) ×4 IMPLANT
SCREW ACUTRAK2  4.7 40.0MM (Screw) ×4 IMPLANT
SCREW ACUTRAK2 4.7 40.0MM (Screw) ×2 IMPLANT
SOL PREP POV-IOD 4OZ 10% (MISCELLANEOUS) ×8 IMPLANT
SPLINT FIBERGLASS 4X30 (CAST SUPPLIES) ×4 IMPLANT
STAPLER VISISTAT (STAPLE) IMPLANT
SUT MNCRL AB 3-0 PS2 27 (SUTURE) ×4 IMPLANT
SUT PROLENE 2 0 FS (SUTURE) ×4 IMPLANT
SUT PROLENE 3 0 PS 2 (SUTURE) ×4 IMPLANT
SUT PROLENE 4 0 PS 2 18 (SUTURE) ×4 IMPLANT
SUT VICRYL 4-0 PS2 18IN ABS (SUTURE) IMPLANT
SYR BULB EAR ULCER 3OZ GRN STR (SYRINGE) ×4 IMPLANT
SYR CONTROL 10ML LL (SYRINGE) ×4 IMPLANT
UNDERPAD 30X36 HEAVY ABSORB (UNDERPADS AND DIAPERS) ×4 IMPLANT

## 2019-11-26 NOTE — Discharge Instructions (Signed)
After Surgery Instructions   1) If you are recuperating from surgery anywhere other than home, please be sure to leave us the number where you can be reached.  2) Go directly home and rest.  3) Keep the operated foot(feet) elevated six inches above the hip when sitting or lying down. This will help control swelling and pain.  4) Support the elevated foot and leg with pillows. DO NOT PLACE PILLOWS UNDER THE KNEE.  5) DO NOT REMOVE or get your bandages WET, unless you were given different instructions by your doctor to do so. This increases the risk of infection.  6) Wear your surgical shoe or surgical boot at all times when you are up on your feet.  7) A limited amount of pain and swelling may occur. The skin may take on a bruised appearance. DO NOT BE ALARMED, THIS IS NORMAL.  8) For slight pain and swelling, apply an ice pack directly over the bandages for 15 minutes only out of each hour of the day. Continue until seen in the office for your first post op visit. DON NOT     APPLY ANY FORM OF HEAT TO THE AREA.  9) Have prescriptions filled immediately and take as directed.  10) Drink lots of liquids, water and juice to stay hydrated.  11) CALL IMMEDIATELY IF:  *Bleeding continues until the following day of surgery  *Pain increases and/or does not respond to medication  *Bandages or cast appears to tight  *If your bandage gets wet  *Trip, fall or stump your surgical foot  *If your temperature goes above 101  *If you have ANY questions at all  YOU NOW CONTROL THE EFFORT OF YOUR RECOVERY. ADHERING TO THESE INSTRUCTIONS WILL OFFER YOU THE MOST COMPLETE RESULTS  

## 2019-11-26 NOTE — Transfer of Care (Signed)
Immediate Anesthesia Transfer of Care Note  Patient: Caleb [name]  Procedure(s) Performed: ARTHRODESIS INTERPHALANGEAL JOINT (Right Toe) EXCISION BENIGN SKIN LESION (Right )  Patient Location: PACU  Anesthesia Type:MAC  Level of Consciousness: awake, alert , patient cooperative and responds to stimulation  Airway & Oxygen Therapy: Patient Spontanous Breathing  Post-op Assessment: Report given to RN and Post -op Vital signs reviewed and stable  Post vital signs: Reviewed and stable  Last Vitals:  Vitals Value Taken Time  BP 121/71 11/26/19 0845  Temp    Pulse 82 11/26/19 0847  Resp 18 11/26/19 0847  SpO2 99 % 11/26/19 0847  Vitals shown include unvalidated device data.  Last Pain:  Vitals:   11/26/19 0634  TempSrc:   PainSc: 4          Complications: No complications documented.

## 2019-11-26 NOTE — Anesthesia Procedure Notes (Signed)
Procedure Name: MAC Date/Time: 11/26/2019 7:35 AM Performed by: Michele Rockers, CRNA Pre-anesthesia Checklist: Patient identified, Emergency Drugs available, Suction available, Timeout performed and Patient being monitored Patient Re-evaluated:Patient Re-evaluated prior to induction Oxygen Delivery Method: Simple face mask

## 2019-11-26 NOTE — Anesthesia Postprocedure Evaluation (Signed)
Anesthesia Post Note  Patient: Caleb [name]  Procedure(s) Performed: ARTHRODESIS INTERPHALANGEAL JOINT (Right Toe) EXCISION BENIGN SKIN LESION (Right )     Patient location during evaluation: PACU Anesthesia Type: MAC Level of consciousness: awake and alert Pain management: pain level controlled Vital Signs Assessment: post-procedure vital signs reviewed and stable Respiratory status: spontaneous breathing and respiratory function stable Cardiovascular status: stable Postop Assessment: no apparent nausea or vomiting Anesthetic complications: no   No complications documented.  Last Vitals:  Vitals:   11/26/19 0900 11/26/19 0915  BP: (!) 138/93 (!) 148/86  Pulse: 79 75  Resp: (!) 21 20  Temp:  36.6 C  SpO2: 94% 98%    Last Pain:  Vitals:   11/26/19 0915  TempSrc:   PainSc: 3                  Sandhya Denherder DANIEL

## 2019-11-26 NOTE — Op Note (Addendum)
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 Anesthesia: Monitor Anesthesia Care  Hemostasis:  Total Tourniquet Time Documented: Calf (Right) - 45 minutes Total: Calf (Right) - 45 minutes  EBL: 0 mL  Materials: Acumed 4.5 millimeter screw x1, 3-0 Prolene 3-0 Monocryl Injectables: One-to-one mixture of 1% lidocaine plain half percent Marcaine plain 10 cc Complications: None  Indications for surgery: A 64 y.o. male presents with painful right hallux IPJ contracture with distal tip benign skin lesion/ulcer formation. Patient has failed all conservative therapy including but not limited to local wound care, antibiotics. He wishes to have surgical correction of the foot/deformity. It was determined that patient would benefit from right hallux interphalangeal joint fusion with excision of the benign skin lesion. Informed surgical risk consent was reviewed and read aloud to the patient.  I reviewed the films.  I have discussed my findings with the patient in great detail.  I have discussed all risks including but not limited to infection, stiffness, scarring, limp, disability, deformity, damage to blood vessels and nerves, numbness, poor healing, need for braces, arthritis, chronic pain, amputation, death.  All benefits and realistic expectations discussed in great detail.  I have made no promises as to the outcome.  I have provided realistic expectations.   I have offered the patient a 2nd opinion, which they have declined and assured me they preferred to proceed despite the risks   Procedure in detail: The patient was both verbally and visually identified by myself, the nursing staff, and anesthesia staff in the preoperative holding area. They were then transferred to the operating room and placed on the operative table in supine position.  A calf tourniquet was applied and inflated to 250 mmHg pressure  Attention was directed to the distal benign skin lesion, using 15 blade elliptical incision was excised out and the entire skin lesion was removed in its entirety.  The skin lesion was measured to be 1 cm x 1 cm.  Using 3-0 Prolene, the incision site was primarily closed with simple interrupted suture technique.  Attention was directed to the dorsal aspect of the hallux IPJ linear incision was made using a skin blade down from epidermis down to dermis through the level of the tendon.  Followed by transverse tenotomy of the extensor tendon to expose the interphalangeal joint.  The joint was prepped with sagittal saw and a tabletop technique.  The correction was held in place with temporary K wire.  Bone graft was inserted prior to insertion of the K wire for faster bony fusion.  0.062 K wire was retrograde inserted through the distal phalanx followed by anterograde into the proximal phalanx.  C-arm was used to confirm position which was excellent.  A 4.5 mm Acumed screw was used and inserted in standard technique to hold the position.  Good correction alignment noted.  Wound was irrigated.  Skin incision was closed 3-0 Monocryl for tendon and 3-0 Prolene for skin closure.  Good correction alignment of the digit noted.  Tourniquet was deflated down to a total  time of 45 minutes good perfusion noted to all the digits to the right side.  The wound was dressed with Betadine wet-to-dry dressing, Kerlix, Ace bandage.  He will be nonweightbearing to the right lower  extremity with a surgical shoe on.  At the conclusion of the procedure the patient was awoken from anesthesia and found to have tolerated the procedure well any complications. There were transferred to PACU with vital signs stable and vascular status intact.  Boneta Lucks, DPM

## 2019-11-26 NOTE — Interval H&P Note (Signed)
History and Physical Interval Note:  11/26/2019 7:17 AM  Caleb [name]  has presented today for surgery, with the diagnosis of ULCER RIGHT FOOT.  The various methods of treatment have been discussed with the patient and family. After consideration of risks, benefits and other options for treatment, the patient has consented to  Procedure(s): ARTHRODESIS INTERPHALANGEAL JOINT (Right) EXCISION BENIGN SKIN LESION (Right) as a surgical intervention.  The patient's history has been reviewed, patient examined, no change in status, stable for surgery.  I have reviewed the patient's chart and labs.  Questions were answered to the patient's satisfaction.     Felipa Furnace

## 2019-11-26 NOTE — Addendum Note (Signed)
Addended by: Boneta Lucks on: 11/26/2019 08:41 AM   Modules accepted: Orders

## 2019-11-27 ENCOUNTER — Encounter (HOSPITAL_COMMUNITY): Payer: Self-pay | Admitting: Podiatry

## 2019-11-27 LAB — SURGICAL PATHOLOGY

## 2019-12-03 ENCOUNTER — Telehealth: Payer: Self-pay | Admitting: Pain Medicine

## 2019-12-03 ENCOUNTER — Telehealth: Payer: Self-pay

## 2019-12-03 ENCOUNTER — Other Ambulatory Visit: Payer: Self-pay | Admitting: Pain Medicine

## 2019-12-03 DIAGNOSIS — M542 Cervicalgia: Secondary | ICD-10-CM | POA: Diagnosis not present

## 2019-12-03 DIAGNOSIS — G894 Chronic pain syndrome: Secondary | ICD-10-CM

## 2019-12-03 MED ORDER — HYDROCODONE-ACETAMINOPHEN 7.5-325 MG PO TABS: 1.0000 | ORAL_TABLET | Freq: Four times a day (QID) | ORAL | 0 refills | Status: DC | PRN

## 2019-12-03 NOTE — Telephone Encounter (Signed)
Refill e-scribed today per Dr. Dossie Arbour. Patient called . No answer. LVM.

## 2019-12-03 NOTE — Telephone Encounter (Signed)
I called [facility], there is a script for Hydrocodone that can be filled today. Patient notified per voicemail.

## 2019-12-03 NOTE — Telephone Encounter (Signed)
He said the pharmacy is saying he cant get his meds filled until 12/29, that he doesn't have a November script. Please call the pharmacy and see whats going on. He is anxious to get his medicine.

## 2019-12-03 NOTE — Telephone Encounter (Signed)
vmail stating pharmacy is telling him he cannot refill his meds until Dec 29. Please call pharmacy and find out the problem and let patient know

## 2019-12-04 ENCOUNTER — Ambulatory Visit (INDEPENDENT_AMBULATORY_CARE_PROVIDER_SITE_OTHER): Payer: Medicaid Other

## 2019-12-04 ENCOUNTER — Other Ambulatory Visit: Payer: Self-pay | Admitting: Family Medicine

## 2019-12-04 ENCOUNTER — Ambulatory Visit (INDEPENDENT_AMBULATORY_CARE_PROVIDER_SITE_OTHER): Payer: Medicaid Other | Admitting: Podiatry

## 2019-12-04 ENCOUNTER — Encounter: Payer: Self-pay | Admitting: Podiatry

## 2019-12-04 ENCOUNTER — Other Ambulatory Visit: Payer: Self-pay

## 2019-12-04 VITALS — BP 135/84 | HR 66 | Temp 97.2°F

## 2019-12-04 DIAGNOSIS — L97511 Non-pressure chronic ulcer of other part of right foot limited to breakdown of skin: Secondary | ICD-10-CM

## 2019-12-04 DIAGNOSIS — M2031 Hallux varus (acquired), right foot: Secondary | ICD-10-CM

## 2019-12-04 DIAGNOSIS — Z9889 Other specified postprocedural states: Secondary | ICD-10-CM

## 2019-12-04 NOTE — Telephone Encounter (Signed)
Requested Prescriptions  Pending Prescriptions Disp Refills  . losartan (COZAAR) 100 MG tablet [Pharmacy Med Name: LOSARTAN POTASSIUM 100 MG TAB] 90 tablet 1    Sig: Take 1 tablet (100 mg total) by mouth daily.     Cardiovascular:  Angiotensin Receptor Blockers Passed - 12/04/2019  3:45 PM      Passed - Cr in normal range and within 180 days    Creat  Date Value Ref Range Status  10/21/2016 0.98 0.70 - 1.33 mg/dL Final    Comment:    For patients >26 years of age, the reference limit for Creatinine is approximately 13% higher for people identified as African-American. .    Creatinine, Ser  Date Value Ref Range Status  11/20/2019 0.79 0.61 - 1.24 mg/dL Final         Passed - K in normal range and within 180 days    Potassium  Date Value Ref Range Status  11/20/2019 3.9 3.5 - 5.1 mmol/L Final         Passed - Patient is not pregnant      Passed - Last BP in normal range    BP Readings from Last 1 Encounters:  12/04/19 135/84         Passed - Valid encounter within last 6 months    Recent Outpatient Visits          3 weeks ago Pre-op evaluation   [facility] [place], Dionne Bucy, MD   2 months ago Essential hypertension   [facility] [place], Dionne Bucy, MD   3 months ago Open toe wound, initial encounter   [facility] [place], Dionne Bucy, MD   4 months ago Plaque psoriasis   [place], [place]   5 months ago Acute midline low back pain without sciatica   [place], Vickki Muff, [place]      Future Appointments            In 3 months Bacigal[place], Dionne Bucy, MD [facility]   In 13 months [facility] [place], Herbert Seta, MD [place]           . rosuvastatin (CRESTOR) 40 MG tablet [Pharmacy Med Name: ROSUVASTATIN CALCIUM 40 MG TAB] 90 tablet 1    Sig: Take 1 tablet (40 mg total) by mouth daily.     Cardiovascular:  Antilipid - Statins  Failed - 12/04/2019  3:45 PM      Failed - LDL in normal range and within 360 days    LDL Cholesterol (Calc)  Date Value Ref Range Status  10/21/2016 68 mg/dL (calc) Final    Comment:    Reference range: <100 . Desirable range <100 mg/dL for primary prevention;   <70 mg/dL for patients with CHD or diabetic patients  with > or = 2 CHD risk factors. Marland Kitchen LDL-C is now calculated using the Martin-Hopkins  calculation, which is a validated novel method providing  better accuracy than the Friedewald equation in the  estimation of LDL-C.  Cresenciano Genre et al. Annamaria Helling. 6045;409(81): 2061-2068  (http://education.[employer].com/faq/FAQ164)    LDL Chol Calc (NIH)  Date Value Ref Range Status  09/20/2019 57 0 - 99 mg/dL Final         Passed - Total Cholesterol in normal range and within 360 days    Cholesterol, Total  Date Value Ref Range Status  09/20/2019 123 100 - 199 mg/dL Final         Passed -  HDL in normal range and within 360 days    HDL  Date Value Ref Range Status  09/20/2019 46 >39 mg/dL Final         Passed - Triglycerides in normal range and within 360 days    Triglycerides  Date Value Ref Range Status  09/20/2019 112 0 - 149 mg/dL Final         Passed - Patient is not pregnant      Passed - Valid encounter within last 12 months    Recent Outpatient Visits          3 weeks ago Pre-op evaluation   [facility] [place], Dionne Bucy, MD   2 months ago Essential hypertension   [facility] [place], Dionne Bucy, MD   3 months ago Open toe wound, initial encounter   [facility] [place], Dionne Bucy, MD   4 months ago Plaque psoriasis   [place], [place]   5 months ago Acute midline low back pain without sciatica   [place], Vickki Muff, [place]      Future Appointments            In 3 months Bacigalupo, Dionne Bucy, MD [facility], [place]   In 30 months  [facility] [place], Herbert Seta, [facility]
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
## 2019-12-06 ENCOUNTER — Other Ambulatory Visit: Payer: Self-pay | Admitting: Family Medicine

## 2019-12-06 ENCOUNTER — Other Ambulatory Visit: Payer: Self-pay | Admitting: Physician Assistant

## 2019-12-06 DIAGNOSIS — M542 Cervicalgia: Secondary | ICD-10-CM | POA: Diagnosis not present

## 2019-12-06 DIAGNOSIS — L400 Psoriasis vulgaris: Secondary | ICD-10-CM

## 2019-12-06 NOTE — Telephone Encounter (Signed)
Requested medication (s) are due for refill today: Yes  Requested medication (s) are on the active medication list: No  Last refill:  08/30/19  Future visit scheduled: Yes  Notes to clinic: Unable to refill per protocol, not assigned to protocol     Requested Prescriptions  Pending Prescriptions Disp Refills   mometasone (ELOCON) 0.1 % lotion [Pharmacy Med Name: MOMETASONE FUROATE 0.1% SOLN] 60 mL 1    Sig: APPLY 1 ML ONTO THE SKIN DAILY FOR PSORIASIS      Off-Protocol Failed - 12/06/2019  3:54 PM      Failed - Medication not assigned to a protocol, review manually.      Passed - Valid encounter within last 12 months    Recent Outpatient Visits           3 weeks ago Pre-op evaluation   [facility] [place], Dionne Bucy, MD   2 months ago Essential hypertension   [facility] [place], Dionne Bucy, MD   3 months ago Open toe wound, initial encounter   [facility], Dionne Bucy, MD   4 months ago Plaque psoriasis   [place], [place]   5 months ago Acute midline low back pain without sciatica   [place], [place]       Future Appointments             In 3 months [place], Dionne Bucy, MD [facility], [place]   In 10 months [facility] [place], Herbert Seta, [facility]

## 2019-12-10 DIAGNOSIS — M542 Cervicalgia: Secondary | ICD-10-CM | POA: Diagnosis not present

## 2019-12-13 ENCOUNTER — Telehealth: Payer: Self-pay

## 2019-12-13 DIAGNOSIS — B349 Viral infection, unspecified: Secondary | ICD-10-CM

## 2019-12-13 NOTE — Telephone Encounter (Signed)
Copied from [place] [id number]. Topic: General - Other >> Dec 13, 2019 12:11 PM Celene Kras wrote: Reason for CRM: Pt called stating that he is having cold symptoms. Pt is requesting to have a covid test. Please advise.

## 2019-12-13 NOTE — Telephone Encounter (Signed)
Okay to order and have him drive up for testing per our usual protocol

## 2019-12-14 DIAGNOSIS — B349 Viral infection, unspecified: Secondary | ICD-10-CM | POA: Diagnosis not present

## 2019-12-14 NOTE — Telephone Encounter (Signed)
Test ordered and patient advised

## 2019-12-16 LAB — SARS-COV-2, NAA 2 DAY TAT

## 2019-12-16 LAB — NOVEL CORONAVIRUS, NAA: SARS-CoV-2, NAA: NOT DETECTED

## 2019-12-18 ENCOUNTER — Encounter: Payer: Self-pay | Admitting: Podiatry

## 2019-12-18 ENCOUNTER — Other Ambulatory Visit: Payer: Self-pay

## 2019-12-18 ENCOUNTER — Ambulatory Visit (INDEPENDENT_AMBULATORY_CARE_PROVIDER_SITE_OTHER): Payer: Medicaid Other | Admitting: Podiatry

## 2019-12-18 DIAGNOSIS — Z9889 Other specified postprocedural states: Secondary | ICD-10-CM

## 2019-12-18 DIAGNOSIS — M2031 Hallux varus (acquired), right foot: Secondary | ICD-10-CM

## 2019-12-18 DIAGNOSIS — L97511 Non-pressure chronic ulcer of other part of right foot limited to breakdown of skin: Secondary | ICD-10-CM
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
## 2019-12-21 DIAGNOSIS — M960 Pseudarthrosis after fusion or arthrodesis: Secondary | ICD-10-CM | POA: Diagnosis not present

## 2019-12-21 DIAGNOSIS — M542 Cervicalgia: Secondary | ICD-10-CM | POA: Diagnosis not present

## 2019-12-24 ENCOUNTER — Encounter: Payer: Medicaid Other | Admitting: Pain Medicine

## 2019-12-24 DIAGNOSIS — M542 Cervicalgia: Secondary | ICD-10-CM | POA: Diagnosis not present

## 2020-01-01 ENCOUNTER — Other Ambulatory Visit: Payer: Self-pay

## 2020-01-01 ENCOUNTER — Telehealth: Payer: Self-pay | Admitting: Pain Medicine

## 2020-01-01 ENCOUNTER — Ambulatory Visit (INDEPENDENT_AMBULATORY_CARE_PROVIDER_SITE_OTHER): Payer: Medicaid Other | Admitting: Podiatry

## 2020-01-01 ENCOUNTER — Encounter: Payer: Self-pay | Admitting: Podiatry

## 2020-01-01 DIAGNOSIS — Z9889 Other specified postprocedural states: Secondary | ICD-10-CM

## 2020-01-01 DIAGNOSIS — M2031 Hallux varus (acquired), right foot: Secondary | ICD-10-CM
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
## 2020-01-01 NOTE — Telephone Encounter (Signed)
error 

## 2020-01-01 NOTE — Telephone Encounter (Signed)
Error

## 2020-01-06 NOTE — Progress Notes (Signed)
PROVIDER NOTE: Information contained herein reflects review and annotations entered in association with encounter. Interpretation of such information and data should be left to medically-trained personnel. Information provided to patient can be located elsewhere in the medical record under "Patient Instructions". Document created using STT-dictation technology, any transcriptional errors that may result from process are unintentional.    Patient: Caleb [name]  Service Category: E/M  Provider: Gaspar Cola, MD  DOB: 25-Sep-1955  DOS: 01/07/2020  Specialty: Interventional Pain Management  MRN: [id number]  Setting: Ambulatory outpatient  PCP: Virginia Crews, MD  Type: Established Patient    Referring Provider: Virginia Crews, MD  Location: Office  Delivery: Face-to-face     HPI  [name], a 65 y.o. year old male, is here today because of his Chronic pain syndrome [G89.4]. Caleb [name] primary complain today is Back Pain (Lower back, right hip) Last encounter: My last encounter with him was on 01/01/2020. 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); Cervicalgia; DDD (degenerative disc disease), thoracic; Disorder of superficial peroneal nerve (Right); Chronic upper extremity pain (Left); Cervical spondylosis w/ radiculopathy; Cervical disc disorder with radiculopathy of cervical region; Chronic upper extremity weakness (Left); Abnormal nerve conduction studies (06/08/2017); 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); Spondylosis without myelopathy or radiculopathy, cervical  region; Cervical facet syndrome (Bilateral); Abnormal MRI, cervical spine (10/02/2019 & 08/13/2017); History of fusion of cervical spine; Chronic neck pain (4th area of Pain) (Bilateral) (L>R); Chronic neck pain with history of cervical spinal surgery; LLQ abdominal pain; Neuropathic pain; Nondisplaced fracture of proximal phalanx of left thumb with routine healing; 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 Pain Assessment: Severity of Chronic pain is reported as a 3 /10. Location: Back Right/Denies. Onset: More than a month ago. Quality: Aching,Burning,Sharp,Stabbing. Timing: Constant. Modifying factor(s): meds and heat. Vitals:  height is $RemoveB'5\' 8"'phiRnDEJ$  (1.727 m) and weight is 186 lb (84.4 kg). His temperature is 98.2 F (36.8 C). His blood pressure is 130/85 and his pulse is 84. His oxygen saturation is 95%.   Reason for encounter: worsening of previously known (established) problem.  The patient indicates today having an increase in his low back pain which is referring pain towards both of his buttocks.  Physical exam today was positive bilaterally for facet arthralgia on hyperextension and rotation as well as the [facility] maneuver.  The patient also proved to be positive for bilateral hip joint and SI joint arthralgia on the Patrick maneuver.  Reviewing the medical record, there is no imaging for the hip joints.  Today he appeared to be having more pain on the right hip than the left but range of motion is worse on the left side.  Today I will be ordering some x-rays of his hips.    The patient was asked to tell us which of those pains that we reproduced seem to mimic  closer the pain that he has been experiencing.  He indicated that was the pain that he had with the hyperextension and rotation maneuver.  Therefore, today we will go ahead and schedule him to return for a bilateral lumbar facet block under fluoroscopic guidance and IV sedation.  Today I also reviewed his medications since he indicated that he had "no more refills".  However, it turns out to have been a misunderstanding on his part.  He was looking at the hydrocodone bottle which of course will always say that there are no refills on it since they are not allowed.  I have reminded the patient that he probably has more prescriptions in the pharmacy.  I have reviewed the chart and confirmed that that is the case.  In any case, since I had already reviewed his medications and his PMP today I have provided him with 2 additional prescriptions that were E scribed to his pharmacy and he should be good on those medicines until 05/01/2020.  Pharmacotherapy Assessment   Analgesic: Hydrocodone/APAP 5/325 1 tablet every 6 hours (20 mg/day of hydrocodone).  (Unable to tolerate an oxycodone IR trial due to stomachaches, nausea, headaches, and excessive somnolence.  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   Monitoring: [place] PMP: PDMP reviewed during this encounter.       Pharmacotherapy: No side-effects or adverse reactions reported. Compliance: No problems identified. Effectiveness: Clinically acceptable.  [name], RN  01/07/2020 10:21 AM  Sign when Signing Visit Nursing Pain Medication Assessment:  Safety precautions to be maintained throughout the outpatient stay will include: orient to surroundings, keep bed in low position, maintain call bell within reach at all times, provide assistance with transfer out of bed and ambulation.  Medication Inspection Compliance: Caleb [name] did not  comply with our request to bring his pills to be counted. He was reminded that bringing the medication bottles, even when empty, is a requirement.  Medication: None brought in. Pill/Patch Count: None available to be counted. Bottle Appearance: No container available. Did not bring bottle(s) to appointment. Filled Date: N/A Last Medication intake:  TodaySafety precautions to be maintained throughout the outpatient stay will include: orient to surroundings, keep bed in low position, maintain call bell within reach at all times, provide assistance with transfer out of bed and ambulation.     UDS:  Summary  Date Value Ref Range Status  09/11/2019 Note  Final    Comment:    ==================================================================== [employer] (MW) ==================================================================== Test                             Result       Flag       Units  Drug Present and Declared for Prescription Verification   Hydrocodone                    1156         EXPECTED   ng/mg creat   Hydromorphone                  500          EXPECTED   ng/mg creat   Dihydrocodeine                 90           EXPECTED   ng/mg creat   Norhydrocodone  1409         EXPECTED   ng/mg creat    Sources of hydrocodone include scheduled prescription medications.    Hydromorphone, dihydrocodeine and norhydrocodone are expected    metabolites of hydrocodone. Hydromorphone and dihydrocodeine are    also available as scheduled prescription medications.  Drug Present not Declared for Prescription Verification   Tramadol                       370          UNEXPECTED ng/mg creat   O-Desmethyltramadol            545          UNEXPECTED ng/mg creat   N-Desmethyltramadol            48           UNEXPECTED ng/mg creat    Source of tramadol is a prescription medication. O-desmethyltramadol    and N-desmethyltramadol are expected metabolites of  tramadol.  ==================================================================== Test                      Result    Flag   Units      Ref Range   Creatinine              176              mg/dL      >=20 ==================================================================== Declared Medications:  The flagging and interpretation on this report are based on the  following declared medications.  Unexpected results may arise from  inaccuracies in the declared medications.   **Note: The testing scope of this panel includes these medications:   Hydrocodone (Norco)   **Note: The testing scope of this panel does not include the  following reported medications:   Acetaminophen (Norco)  Aspirin  Cetirizine (Zyrtec)  Doxycycline  Fluticasone (Flonase)  Hydrochlorothiazide  Hydrocortisone  Isosorbide (Imdur)  Losartan (Cozaar)  Methocarbamol (Robaxin)  Mometasone  Naloxone (Narcan)  Neomycin  Nitroglycerin (Nitrostat)  Polymyxin B (Polymyxin)  Pregabalin (Lyrica)  Rosuvastatin (Crestor)  Supplement  Tadalafil (Cialis)  Topical  Topical Lidocaine (Lidoderm)  Umeclidinium (Anoro)  Vilanterol (Anoro) ==================================================================== For clinical consultation, please call [phone]. ====================================================================      ROS  Constitutional: Denies any fever or chills Gastrointestinal: No reported hemesis, hematochezia, vomiting, or acute GI distress Musculoskeletal: Denies any acute onset joint swelling, redness, loss of ROM, or weakness Neurological: No reported episodes of acute onset apraxia, aphasia, dysarthria, agnosia, amnesia, paralysis, loss of coordination, or loss of consciousness  Medication Review  HYDROcodone-acetaminophen, Melatonin, Turmeric, aspirin EC, cetirizine, fluocinolone, fluticasone, hydrochlorothiazide, ibuprofen, isosorbide mononitrate, losartan, methocarbamol, mometasone,  naloxone, nitroGLYCERIN, pregabalin, rosuvastatin, tadalafil, and umeclidinium-vilanterol  History Review  Allergy: Mr. Cofer is allergic to lisinopril and oxycodone. Drug: Mr. Crapps  reports no history of drug use. Alcohol:  reports current alcohol use of about 4.0 standard drinks of alcohol per week. Tobacco:  reports that he has been smoking cigarettes. He has a 38.25 pack-year smoking history. He has never used smokeless tobacco. Social: Mr. Lewinski  reports that he has been smoking cigarettes. He has a 38.25 pack-year smoking history. He has never used smokeless tobacco. He reports current alcohol use of about 4.0 standard drinks of alcohol per week. He reports that he does not use drugs. Medical:  has a past medical history of Chronic foot pain, right (2015), COPD (chronic obstructive pulmonary disease) ([place]), Coronary artery disease, Cough  syncope, Emphysema lung ([place]), Family history of adverse reaction to anesthesia, History of kidney stones, Hyperlipidemia, Hypertension, Kidney stone, Leucocytosis, Myocardial infarction ([place]) (2015), and OSA on CPAP. Surgical: Mr. Persky  has a past surgical history that includes Lumbar laminectomy (1989, 1999); Cervical fusion (1988, 1998); Liver surgery (2015); Partial colectomy (1990); Inguinal hernia repair (Bilateral, 1975); Lithotripsy; Colonoscopy with propofol (N/A, 02/24/2017); Cardiac catheterization (2015); Arthrodesis metatarsal (Right, 11/26/2019); and Lesion removal (Right, 11/26/2019). Family: family history includes Alzheimer's disease in his paternal grandmother; Alzheimer's disease (age of onset: 10) in his father; Breast cancer in his maternal uncle; CAD in his mother; Dementia in his father; Diabetes in his maternal grandmother; Healthy in his sister; Heart attack in his maternal uncle; Heart failure in his maternal grandmother; Heart failure (age of onset: 83) in his mother; Non-Hodgkin's lymphoma in his sister.  Laboratory Chemistry  Profile   Renal Lab Results  Component Value Date   BUN 14 11/20/2019   CREATININE 0.79 11/20/2019   BCR 18 09/20/2019   GFRAA 112 09/20/2019   GFRNONAA >60 11/20/2019     Hepatic Lab Results  Component Value Date   AST 15 09/20/2019   ALT 14 09/20/2019   ALBUMIN 4.1 09/20/2019   ALKPHOS 67 09/20/2019     Electrolytes Lab Results  Component Value Date   NA 135 11/20/2019   K 3.9 11/20/2019   CL 99 11/20/2019   CALCIUM 9.2 11/20/2019   MG 2.0 05/30/2018     Bone Lab Results  Component Value Date   25OHVITD1 32 02/17/2017   25OHVITD2 <1.0 02/17/2017   25OHVITD3 32 02/17/2017   TESTOFREE 5.2 (L) 02/28/2017   TESTOSTERONE 279 02/28/2017     Inflammation (CRP: Acute Phase) (ESR: Chronic Phase) Lab Results  Component Value Date   CRP 8 11/07/2017   ESRSEDRATE 26 11/07/2017       Note: Above Lab results reviewed.  Recent Imaging Review  DG Foot Complete Right Please see detailed radiograph report in office note. Note: Reviewed        Physical Exam  General appearance: Well nourished, well developed, and well hydrated. In no apparent acute distress Mental status: Alert, oriented x 3 (person, place, & time)       Respiratory: No evidence of acute respiratory distress Eyes: PERLA Vitals: BP 130/85    Pulse 84    Temp 98.2 F (36.8 C)    Ht $R'5\' 8"'Oa$  (1.727 m)    Wt 186 lb (84.4 kg)    SpO2 95%    BMI 28.28 kg/m  BMI: Estimated body mass index is 28.28 kg/m as calculated from the following:   Height as of this encounter: $RemoveBeforeD'5\' 8"'GnHMjQHVaNmves$  (1.727 m).   Weight as of this encounter: 186 lb (84.4 kg). Ideal: Ideal body weight: 68.4 kg (150 lb 12.7 oz) Adjusted ideal body weight: 74.8 kg (164 lb 14 oz)  Assessment   Status Diagnosis  Controlled Controlled Controlled 1. Chronic pain syndrome   2. Lumbar facet joint syndrome   3. Chronic low back pain (Bilateral) w/o sciatica   4. Chronic sacroiliac joint pain (Bilateral)   5. Chronic hip pain (Bilateral)      Updated  Problems: Problem  Lumbar facet syndrome (Bilateral)  Chronic sacroiliac joint pain (Bilateral)  Chronic low back pain (Bilateral) w/o sciatica  Chronic hip pain (Bilateral)    Plan of Care  Problem-specific:  No problem-specific Assessment & Plan notes found for this encounter.  Mr. BENETT SWOYER has a current medication list  which includes the following long-term medication(s): cetirizine, hydrochlorothiazide, [START ON 02/01/2020] hydrocodone-acetaminophen, [START ON 03/02/2020] hydrocodone-acetaminophen, [START ON 04/01/2020] hydrocodone-acetaminophen, isosorbide mononitrate, losartan, methocarbamol, nitroglycerin, pregabalin, rosuvastatin, and tadalafil.  Pharmacotherapy (Medications Ordered): Meds ordered this encounter  Medications   HYDROcodone-acetaminophen (NORCO) 7.5-325 MG tablet    Sig: Take 1 tablet by mouth every 6 (six) hours as needed for severe pain. Must last 30 days.    Dispense:  120 tablet    Refill:  0    Chronic Pain: STOP Act (Not applicable) Fill 1 day early if closed on refill date. Avoid benzodiazepines within 8 hours of opioids   HYDROcodone-acetaminophen (NORCO) 7.5-325 MG tablet    Sig: Take 1 tablet by mouth every 6 (six) hours as needed for severe pain. Must last 30 days.    Dispense:  120 tablet    Refill:  0    Chronic Pain: STOP Act (Not applicable) Fill 1 day early if closed on refill date. Avoid benzodiazepines within 8 hours of opioids   Orders:  Orders Placed This Encounter  Procedures   LUMBAR FACET(MEDIAL BRANCH NERVE BLOCK) MBNB    Standing Status:   Future    Standing Expiration Date:   02/07/2020    Scheduling Instructions:     Procedure: Lumbar facet block (AKA.: Lumbosacral medial branch nerve block)     Side: Bilateral     Level: L3-4, L4-5, & L5-S1 Facets (L2, L3, L4, L5, & S1 Medial Branch Nerves)     Sedation: Patient's choice.     Timeframe: ASAA    Order Specific Question:   Where will this procedure be performed?     Answer:   [facility]   DG HIP UNILAT W OR W/O PELVIS 2-3 VIEWS RIGHT    Please describe any evidence of DJD, such as joint narrowing, asymmetry, cysts, or any anomalies in bone density, production, or erosion.    Standing Status:   Future    Standing Expiration Date:   02/07/2020    Scheduling Instructions:     Imaging must be done as soon as possible. Inform patient that order will expire within 30 days and I will not renew it.    Order Specific Question:   Reason for Exam (SYMPTOM  OR DIAGNOSIS REQUIRED)    Answer:   Right hip pain/arthralgia    Order Specific Question:   Preferred imaging location?    Answer:   [facility]    Order Specific Question:   Call Results- Best Contact Number?    Answer:   [phone] ([facility])    Order Specific Question:   Release to patient    Answer:   Immediate   DG HIP UNILAT W OR W/O PELVIS 2-3 VIEWS LEFT    Please describe any evidence of DJD, such as joint narrowing, asymmetry, cysts, or any anomalies in bone density, production, or erosion.    Standing Status:   Future    Standing Expiration Date:   02/07/2020    Scheduling Instructions:     Imaging must be done as soon as possible. Inform patient that order will expire within 30 days and I will not renew it.    Order Specific Question:   Reason for Exam (SYMPTOM  OR DIAGNOSIS REQUIRED)    Answer:   Right hip pain/arthralgia    Order Specific Question:   Preferred imaging location?    Answer:   [facility]    Order Specific Question:   Call Results- Best Contact  Number?    Answer:   [phone] [facility])   Follow-up plan:   Return for Procedure (w/ sedation): (B) L-FCT BLK.      Interventional management options: Considering:   Possible spinal cord stimulator trial  Possible bilateral cervical facet RFA Diagnostic right ankle block Diagnosticright lumbar sympathetic block Diagnostic midlineTESI Diagnostic bilateral thoracic facet  block Possible bilateral thoracic facet RFA   Palliative PRN treatment(s):   Palliative left CESI #4 Diagnostic bilateral cervical facet block#3 Palliative/diagnostic right-sidedCommon Peroneal NB#3 Therapeutic right-sided deep peroneal nerve block #2  Palliative right-sided common peroneal nerve RFA #2(last done on 06/21/2017)           Recent Visits Date Type Provider Dept  11/21/19 Office Visit Milinda Pointer, MD [facility]  10/17/19 Telemedicine Milinda Pointer, MD [facility]  Showing recent visits within past 90 days and meeting all other requirements Today's Visits Date Type Provider Dept  01/07/20 Office Visit Milinda Pointer, MD [facility]  Showing today's visits and meeting all other requirements Future Appointments Date Type Provider Dept  02/27/20 Appointment Milinda Pointer, MD [facility]  Showing future appointments within next 90 days and meeting all other requirements  I discussed the assessment and treatment plan with the patient. The patient was provided an opportunity to ask questions and all were answered. The patient agreed with the plan and demonstrated an understanding of the instructions.  Patient advised to call back or seek an in-person evaluation if the symptoms or condition worsens.  Duration of encounter: 35 minutes.  Note by: Gaspar Cola, MD Date: 01/07/2020; Time: 1:10 PM

## 2020-01-07 ENCOUNTER — Encounter: Payer: Self-pay | Admitting: Pain Medicine

## 2020-01-07 ENCOUNTER — Other Ambulatory Visit: Payer: Self-pay

## 2020-01-07 ENCOUNTER — Ambulatory Visit: Payer: Medicaid Other | Attending: Pain Medicine | Admitting: Pain Medicine

## 2020-01-07 VITALS — BP 130/85 | HR 84 | Temp 98.2°F | Ht 68.0 in | Wt 186.0 lb

## 2020-01-07 DIAGNOSIS — G894 Chronic pain syndrome: Secondary | ICD-10-CM | POA: Diagnosis not present

## 2020-01-07 DIAGNOSIS — M47816 Spondylosis without myelopathy or radiculopathy, lumbar region: Secondary | ICD-10-CM | POA: Diagnosis not present

## 2020-01-07 DIAGNOSIS — M545 Low back pain, unspecified: Secondary | ICD-10-CM | POA: Insufficient documentation

## 2020-01-07 DIAGNOSIS — M25552 Pain in left hip: Secondary | ICD-10-CM

## 2020-01-07 DIAGNOSIS — G8929 Other chronic pain: Secondary | ICD-10-CM | POA: Insufficient documentation

## 2020-01-07 DIAGNOSIS — M25551 Pain in right hip: Secondary | ICD-10-CM | POA: Diagnosis not present

## 2020-01-07 DIAGNOSIS — M533 Sacrococcygeal disorders, not elsewhere classified: Secondary | ICD-10-CM | POA: Diagnosis not present

## 2020-01-07 MED ORDER — HYDROCODONE-ACETAMINOPHEN 7.5-325 MG PO TABS: 1.0000 | ORAL_TABLET | Freq: Four times a day (QID) | ORAL | 0 refills | Status: DC | PRN

## 2020-01-07 NOTE — Patient Instructions (Signed)
____________________________________________________________________________________________  Preparing for Procedure with Sedation  Procedure appointments are limited to planned procedures: . No Prescription Refills. . No disability issues will be discussed. . No medication changes will be discussed.  Instructions: . Oral Intake: Do not eat or drink anything for at least 8 hours prior to your procedure. (Exception: Blood Pressure Medication. See below.) . Transportation: Unless otherwise stated by your physician, you may drive yourself after the procedure. . Blood Pressure Medicine: Do not forget to take your blood pressure medicine with a sip of water the morning of the procedure. If your Diastolic (lower reading)is above 100 mmHg, elective cases will be cancelled/rescheduled. . Blood thinners: These will need to be stopped for procedures. Notify our staff if you are taking any blood thinners. Depending on which one you take, there will be specific instructions on how and when to stop it. . Diabetics on insulin: Notify the staff so that you can be scheduled 1st case in the morning. If your diabetes requires high dose insulin, take only  of your normal insulin dose the morning of the procedure and notify the staff that you have done so. . Preventing infections: Shower with an antibacterial soap the morning of your procedure. . Build-up your immune system: Take 1000 mg of Vitamin C with every meal (3 times a day) the day prior to your procedure. . Antibiotics: Inform the staff if you have a condition or reason that requires you to take antibiotics before dental procedures. . Pregnancy: If you are pregnant, call and cancel the procedure. . Sickness: If you have a cold, fever, or any active infections, call and cancel the procedure. . Arrival: You must be in the facility at least 30 minutes prior to your scheduled procedure. . Children: Do not bring children with you. . Dress appropriately:  Bring dark clothing that you would not mind if they get stained. . Valuables: Do not bring any jewelry or valuables.  Reasons to call and reschedule or cancel your procedure: (Following these recommendations will minimize the risk of a serious complication.) . Surgeries: Avoid having procedures within 2 weeks of any surgery. (Avoid for 2 weeks before or after any surgery). . Flu Shots: Avoid having procedures within 2 weeks of a flu shots or . (Avoid for 2 weeks before or after immunizations). . Barium: Avoid having a procedure within 7-10 days after having had a radiological study involving the use of radiological contrast. (Myelograms, Barium swallow or enema study). . Heart attacks: Avoid any elective procedures or surgeries for the initial 6 months after a "Myocardial Infarction" (Heart Attack). . Blood thinners: It is imperative that you stop these medications before procedures. Let us know if you if you take any blood thinner.  . Infection: Avoid procedures during or within two weeks of an infection (including chest colds or gastrointestinal problems). Symptoms associated with infections include: Localized redness, fever, chills, night sweats or profuse sweating, burning sensation when voiding, cough, congestion, stuffiness, runny nose, sore throat, diarrhea, nausea, vomiting, cold or Flu symptoms, recent or current infections. It is specially important if the infection is over the area that we intend to treat. . Heart and lung problems: Symptoms that may suggest an active cardiopulmonary problem include: cough, chest pain, breathing difficulties or shortness of breath, dizziness, ankle swelling, uncontrolled high or unusually low blood pressure, and/or palpitations. If you are experiencing any of these symptoms, cancel your procedure and contact your primary care physician for an evaluation.  Remember:  Regular Business hours are:    Monday to Thursday 8:00 AM to 4:00 PM  Provider's  Schedule: Caylan Schifano, MD:  Procedure days: Tuesday and Thursday 7:30 AM to 4:00 PM  Bilal Lateef, MD:  Procedure days: Monday and Wednesday 7:30 AM to 4:00 PM ____________________________________________________________________________________________   ____________________________________________________________________________________________  General Risks and Possible Complications  Patient Responsibilities: It is important that you read this as it is part of your informed consent. It is our duty to inform you of the risks and possible complications associated with treatments offered to you. It is your responsibility as a patient to read this and to ask questions about anything that is not clear or that you believe was not covered in this document.  Patient's Rights: You have the right to refuse treatment. You also have the right to change your mind, even after initially having agreed to have the treatment done. However, under this last option, if you wait until the last second to change your mind, you may be charged for the materials used up to that point.  Introduction: Medicine is not an exact science. Everything in Medicine, including the lack of treatment(s), carries the potential for danger, harm, or loss (which is by definition: Risk). In Medicine, a complication is a secondary problem, condition, or disease that can aggravate an already existing one. All treatments carry the risk of possible complications. The fact that a side effects or complications occurs, does not imply that the treatment was conducted incorrectly. It must be clearly understood that these can happen even when everything is done following the highest safety standards.  No treatment: You can choose not to proceed with the proposed treatment alternative. The "PRO(s)" would include: avoiding the risk of complications associated with the therapy. The "CON(s)" would include: not getting any of the treatment  benefits. These benefits fall under one of three categories: diagnostic; therapeutic; and/or palliative. Diagnostic benefits include: getting information which can ultimately lead to improvement of the disease or symptom(s). Therapeutic benefits are those associated with the successful treatment of the disease. Finally, palliative benefits are those related to the decrease of the primary symptoms, without necessarily curing the condition (example: decreasing the pain from a flare-up of a chronic condition, such as incurable terminal cancer).  General Risks and Complications: These are associated to most interventional treatments. They can occur alone, or in combination. They fall under one of the following six (6) categories: no benefit or worsening of symptoms; bleeding; infection; nerve damage; allergic reactions; and/or death. 1. No benefits or worsening of symptoms: In Medicine there are no guarantees, only probabilities. No healthcare provider can ever guarantee that a medical treatment will work, they can only state the probability that it may. Furthermore, there is always the possibility that the condition may worsen, either directly, or indirectly, as a consequence of the treatment. 2. Bleeding: This is more common if the patient is taking a blood thinner, either prescription or over the counter (example: Goody Powders, Fish oil, Aspirin, Garlic, etc.), or if suffering a condition associated with impaired coagulation (example: Hemophilia, cirrhosis of the liver, low platelet counts, etc.). However, even if you do not have one on these, it can still happen. If you have any of these conditions, or take one of these drugs, make sure to notify your treating physician. 3. Infection: This is more common in patients with a compromised immune system, either due to disease (example: diabetes, cancer, human immunodeficiency virus [HIV], etc.), or due to medications or treatments (example: therapies used to treat  cancer and   rheumatological diseases). However, even if you do not have one on these, it can still happen. If you have any of these conditions, or take one of these drugs, make sure to notify your treating physician. 4. Nerve Damage: This is more common when the treatment is an invasive one, but it can also happen with the use of medications, such as those used in the treatment of cancer. The damage can occur to small secondary nerves, or to large primary ones, such as those in the spinal cord and brain. This damage may be temporary or permanent and it may lead to impairments that can range from temporary numbness to permanent paralysis and/or brain death. 5. Allergic Reactions: Any time a substance or material comes in contact with our body, there is the possibility of an allergic reaction. These can range from a mild skin rash (contact dermatitis) to a severe systemic reaction (anaphylactic reaction), which can result in death. 6. Death: In general, any medical intervention can result in death, most of the time due to an unforeseen complication. ____________________________________________________________________________________________   

## 2020-01-07 NOTE — Progress Notes (Signed)
Nursing Pain Medication Assessment:  Safety precautions to be maintained throughout the outpatient stay will include: orient to surroundings, keep bed in low position, maintain call bell within reach at all times, provide assistance with transfer out of bed and ambulation.  Medication Inspection Compliance: Caleb [name] did not comply with our request to bring his pills to be counted. He was reminded that bringing the medication bottles, even when empty, is a requirement.  Medication: None brought in. Pill/Patch Count: None available to be counted. Bottle Appearance: No container available. Did not bring bottle(s) to appointment. Filled Date: N/A Last Medication intake:  TodaySafety precautions to be maintained throughout the outpatient stay will include: orient to surroundings, keep bed in low position, maintain call bell within reach at all times, provide assistance with transfer out of bed and ambulation.

## 2020-01-10 DIAGNOSIS — M542 Cervicalgia: Secondary | ICD-10-CM | POA: Diagnosis not present

## 2020-01-14 DIAGNOSIS — M542 Cervicalgia: Secondary | ICD-10-CM | POA: Diagnosis not present

## 2020-01-15 ENCOUNTER — Ambulatory Visit: Payer: Medicaid Other | Admitting: Family Medicine

## 2020-01-15 ENCOUNTER — Other Ambulatory Visit: Payer: Self-pay

## 2020-01-15 ENCOUNTER — Encounter: Payer: Self-pay | Admitting: Family Medicine

## 2020-01-15 VITALS — BP 108/76 | HR 83 | Temp 98.4°F | Resp 20 | Wt 192.7 lb

## 2020-01-15 DIAGNOSIS — Z77120 Contact with and (suspected) exposure to mold (toxic): Secondary | ICD-10-CM

## 2020-01-15 DIAGNOSIS — I10 Essential (primary) hypertension: Secondary | ICD-10-CM | POA: Diagnosis not present

## 2020-01-15 DIAGNOSIS — R519 Headache, unspecified: Secondary | ICD-10-CM

## 2020-01-15 MED ORDER — FLUTICASONE PROPIONATE 50 MCG/ACT NA SUSP
2.0000 | Freq: Every day | NASAL | 11 refills | Status: DC
Start: 2020-01-15 — End: 2022-02-09

## 2020-01-15 MED ORDER — CETIRIZINE HCL 10 MG PO TABS
10.0000 mg | ORAL_TABLET | Freq: Every day | ORAL | 3 refills | Status: DC
Start: 2020-01-15 — End: 2020-12-03

## 2020-01-15 NOTE — Progress Notes (Signed)
Established patient visit   Patient: Caleb [name]   DOB: 1955/08/24   65 y.o. Male  MRN: [id number] Visit Date: 01/15/2020  Today's healthcare provider: Shirlee Latch, MD   Chief Complaint  Patient presents with  . Hypertension   Subjective    HPI  Follow up for Hypertension  The patient was last seen for this 4 months ago. Changes made at last visit include no changes.  He reports excellent compliance with treatment. He feels that condition is Worse. He is not having side effects.    Has been having headaches for the last 2-3 weeks. Checked BP yesterday and it was >190 systolic.  When staying at home, has headaches, cough, nasal congestion.  When staying in another place, it is better.  He has black mold in his apartment.  BP Readings from Last 3 Encounters:  01/15/20 108/76  01/07/20 130/85  12/04/19 135/84  --------------------------------------------------------------------------------------   Patient Active Problem List   Diagnosis Date Noted  . Lumbar facet syndrome (Bilateral) 01/07/2020  . Chronic sacroiliac joint pain (Bilateral) 01/07/2020  . Uncomplicated opioid dependence (HCC) 11/21/2019  . Neuropathy of right foot 09/20/2019  . DDD (degenerative disc disease), lumbosacral 06/28/2019  . 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 Sacroiliac joint pain (Left) 06/19/2019  . Sacroiliac joint dysfunction (Left) 06/19/2019  . Somatic dysfunction of sacroiliac joint (Left) 06/19/2019  . Chronic low back pain (Bilateral) w/o sciatica 06/19/2019  . Chronic hip pain (Bilateral) 03/07/2019  . Cervical paraspinal muscle spasm 01/01/2019  . Trigger point of neck 01/01/2019  . Prediabetes 12/20/2018  . 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 Nondisplaced fracture of proximal phalanx of left thumb with routine healing 06/15/2018  . Bradycardia 06/15/2018  . Neuropathic pain 05/23/2018  . Cough syncope 04/07/2018  . LLQ abdominal  pain 03/08/2018  . Chronic neck pain (4th area of Pain) (Bilateral) (L>R) 02/06/2018  . Chronic neck pain with history of cervical spinal surgery 02/06/2018  . Moderate episode of recurrent major depressive disorder (HCC) 01/25/2018  . GAD (generalized anxiety disorder) 01/25/2018  . History of fusion of cervical spine 10/19/2017  . Abnormal MRI, cervical spine (10/02/2019 & 08/13/2017) 09/08/2017  . Spondylosis without myelopathy or radiculopathy, cervical region 09/07/2017  . Cervical facet syndrome (Bilateral) 09/07/2017  . Abnormal nerve conduction studies (06/08/2017) 06/16/2017  . 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 Chronic upper extremity pain (Left) 05/11/2017  . Cervical spondylosis w/ radiculopathy 05/11/2017  . Cervical disc disorder with radiculopathy of cervical region 05/11/2017  . Chronic upper extremity weakness (Left) 05/11/2017  . Disorder of superficial peroneal nerve (Right) 03/24/2017  . DDD (degenerative disc disease), thoracic 03/02/2017  . DDD (degenerative disc disease), cervical 03/01/2017  . 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 Cervicalgia 03/01/2017  . Elevated C-reactive protein (CRP) 02/28/2017  . Elevated sed rate 02/28/2017  . Plaque psoriasis 02/28/2017  . Spondylosis without myelopathy or radiculopathy, cervicothoracic region 02/28/2017  . Chronic musculoskeletal pain 02/28/2017  . Neurogenic foot pain (Right) 02/28/2017  . Chronic ankle pain (2ry area of Pain) (Right) 02/17/2017  . Chronic thoracic back pain (3ry area of Pain) (Midline) 02/17/2017  . Chronic pain syndrome 02/17/2017  . Long term current use of opiate analgesic 02/17/2017  . Pharmacologic therapy 02/17/2017  . Disorder of skeletal system 02/17/2017  . Problems influencing health status 02/17/2017  . CAD (coronary artery disease), native coronary artery 02/09/2017  . Erectile dysfunction 12/17/2016  .  Tobacco  abuse 11/18/2016  . Chest pain 10/21/2016  . OSA (obstructive sleep apnea)   . Hypertension   . Hyperlipidemia   . COPD (chronic obstructive pulmonary disease) ([place])   . Myocardial infarction ([place]) 01/04/2013  . Chronic foot pain (1ry area of Pain) (Right) 01/04/2013   Social History   Tobacco Use  . Smoking status: Current Every Day Smoker    Packs/day: 0.75    Years: 51.00    Pack years: 38.25    Types: Cigarettes  . Smokeless tobacco: Never Used  . Tobacco comment: 1 ppd 03/21/19//lmr  Vaping Use  . Vaping Use: Never used  Substance Use Topics  . Alcohol use: Yes    Alcohol/week: 4.0 standard drinks    Types: 4 Cans of beer per week    Comment: weekly  . Drug use: No   Allergies  Allergen Reactions  . Lisinopril Cough  . Oxycodone Nausea And Vomiting       Medications: Outpatient Medications Prior to Visit  Medication Sig  . ANORO ELLIPTA 62.5-25 MCG/INH AEPB INHALE 1 PUFF BY MOUTH EVERY DAY (Patient taking differently: Inhale 1 puff into the lungs daily.)  . aspirin EC 81 MG tablet Take 81 mg by mouth daily.   . fluocinolone (SYNALAR) 0.01 % external solution Apply topically 2 (two) times daily. To ears for psoriasis (Patient taking differently: Apply 1 application topically 2 (two) times daily as needed (psoriasis in ears).)  . hydrochlorothiazide (HYDRODIURIL) 25 MG tablet TAKE 1 TABLET BY MOUTH EVERY DAY (Patient taking differently: Take 25 mg by mouth daily.)  . [START ON 02/01/2020] HYDROcodone-acetaminophen (NORCO) 7.5-325 MG tablet Take 1 tablet by mouth every 6 (six) hours as needed for severe pain. Must last 30 days.  Derrill Memo ON 03/02/2020] HYDROcodone-acetaminophen (NORCO) 7.5-325 MG tablet Take 1 tablet by mouth every 6 (six) hours as needed for severe pain. Must last 30 days.  Derrill Memo ON 04/01/2020] HYDROcodone-acetaminophen (NORCO) 7.5-325 MG tablet Take 1 tablet by mouth every 6 (six) hours as needed for severe pain. Must last 30 days.  Marland Kitchen ibuprofen (ADVIL)  800 MG tablet Take 1 tablet (800 mg total) by mouth every 6 (six) hours as needed.  . isosorbide mononitrate (IMDUR) 30 MG 24 hr tablet TAKE 0.5 TABLETS (15 MG TOTAL) BY MOUTH DAILY.  Marland Kitchen losartan (COZAAR) 100 MG tablet Take 1 tablet (100 mg total) by mouth daily.  . Melatonin 10 MG CAPS Take 20 mg by mouth at bedtime.  . methocarbamol (ROBAXIN) 750 MG tablet Take 1 tablet (750 mg total) by mouth every 8 (eight) hours as needed for muscle spasms.  . mometasone (ELOCON) 0.1 % lotion APPLY 1 ML ONTO THE SKIN DAILY FOR PSORIASIS  . naloxone (NARCAN) nasal spray 4 mg/0.1 mL Use in case of opioid overdose  . nitroGLYCERIN (NITROSTAT) 0.4 MG SL tablet Place 1 tablet (0.4 mg total) under the tongue every 5 (five) minutes as needed for chest pain.  . pregabalin (LYRICA) 150 MG capsule Take 1 capsule (150 mg total) by mouth 3 (three) times daily.  . rosuvastatin (CRESTOR) 40 MG tablet Take 1 tablet (40 mg total) by mouth daily.  . tadalafil (CIALIS) 20 MG tablet Take 1 tablet (20 mg total) by mouth daily as needed for erectile dysfunction.  . TURMERIC PO Take 1 capsule by mouth daily.  . [DISCONTINUED] cetirizine (ZYRTEC) 10 MG tablet TAKE 1 TABLET BY MOUTH EVERY DAY (Patient taking differently: Take 10 mg by mouth daily.)  . [DISCONTINUED] fluticasone (  FLONASE) 50 MCG/ACT nasal spray Place 2 sprays into both nostrils daily.    No facility-administered medications prior to visit.    Review of Systems  Constitutional: Positive for diaphoresis and fatigue. Negative for chills and fever.  HENT: Positive for congestion, postnasal drip, rhinorrhea, sinus pressure, sinus pain and sore throat. Negative for ear pain and sneezing.   Eyes: Negative for visual disturbance.  Respiratory: Positive for cough and wheezing. Negative for chest tightness and shortness of breath.   Cardiovascular: Positive for chest pain. Negative for palpitations and leg swelling.  Gastrointestinal: Negative for abdominal pain, diarrhea,  nausea and vomiting.  Neurological: Positive for headaches.    Last CBC Lab Results  Component Value Date   WBC 11.4 (H) 11/20/2019   HGB 15.1 11/20/2019   HCT 46.1 11/20/2019   MCV 94.3 11/20/2019   MCH 30.9 11/20/2019   RDW 12.6 11/20/2019   PLT 291 18/29/9371   Last metabolic panel Lab Results  Component Value Date   GLUCOSE 94 11/20/2019   NA 135 11/20/2019   K 3.9 11/20/2019   CL 99 11/20/2019   CO2 26 11/20/2019   BUN 14 11/20/2019   CREATININE 0.79 11/20/2019   GFRNONAA >60 11/20/2019   GFRAA 112 09/20/2019   CALCIUM 9.2 11/20/2019   PROT 7.0 09/20/2019   ALBUMIN 4.1 09/20/2019   LABGLOB 2.9 09/20/2019   AGRATIO 1.4 09/20/2019   BILITOT 0.4 09/20/2019   ALKPHOS 67 09/20/2019   AST 15 09/20/2019   ALT 14 09/20/2019   ANIONGAP 10 11/20/2019      Objective    BP 108/76 (BP Location: Left Arm, Patient Position: Sitting, Cuff Size: Large)   Pulse 83   Temp 98.4 F (36.9 C) (Oral)   Resp 20   Wt 192 lb 11.2 oz (87.4 kg)   SpO2 97%   BMI 29.30 kg/m  BP Readings from Last 3 Encounters:  01/15/20 108/76  01/07/20 130/85  12/04/19 135/84   Wt Readings from Last 3 Encounters:  01/15/20 192 lb 11.2 oz (87.4 kg)  01/07/20 186 lb (84.4 kg)  11/21/19 190 lb (86.2 kg)      Physical Exam Constitutional:      General: He is not in acute distress.    Appearance: Normal appearance. He is not diaphoretic.  HENT:     Head: Normocephalic.  Eyes:     General: No scleral icterus.    Conjunctiva/sclera: Conjunctivae normal.  Cardiovascular:     Rate and Rhythm: Normal rate and regular rhythm.  Pulmonary:     Effort: Pulmonary effort is normal. No respiratory distress.     Breath sounds: Normal breath sounds. No wheezing or rhonchi.  Musculoskeletal:     Cervical back: Neck supple.     Right lower leg: No edema.     Left lower leg: No edema.  Lymphadenopathy:     Cervical: No cervical adenopathy.  Neurological:     Mental Status: He is alert and oriented  to person, place, and time. Mental status is at baseline.     Cranial Nerves: No cranial nerve deficit.     Motor: No weakness.  Psychiatric:        Mood and Affect: Mood normal.        Behavior: Behavior normal.       No results found for any visits on 01/15/20.  Assessment & Plan     1. Essential hypertension - normal today - well controlled - suspect high BP yesterday related to headache  as below - no changes to medications - upcoming f/u for repeat lab work and reassessment  2. 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 Nonintractable episodic headache, unspecified headache type -New problem - Neuro intact today with no headache currently - Headache seems to be related to being in his home which has exposure to black mold - Antihistamine and Flonase as above - Tylenol as needed, but discussed dose limits given that there is acetaminophen in his pain medication - Avoid NSAIDs if possible - No red flags today and blood pressure is well controlled - Return precautions discussed    Return in about 2 months (around 03/14/2020) for chronic disease f/u, as scheduled.       Total time spent on today's visit was greater than 30 minutes, including both face-to-face time and nonface-to-face time personally spent on review of chart (labs and imaging), discussing labs and goals, discussing further work-up, treatment options, referrals to specialist if needed, reviewing outside records of pertinent, answering patient's questions, and coordinating care.    I, Lavon Paganini, MD, have reviewed all documentation for this visit. The documentation on 01/15/20 for the exam, diagnosis, procedures, and orders are all accurate  and complete.   Honorio Devol, Dionne Bucy, MD, MPH [facility]

## 2020-01-15 NOTE — Patient Instructions (Addendum)
Allergies, Adult An allergy is a condition in which the body's defense system (immune system) comes in contact with an allergen and reacts to it. An allergen is anything that causes an allergic reaction. Allergens cause the immune system to make proteins for fighting infections (antibodies). These antibodies cause cells to release chemicals called histamines that set off the symptoms of an allergic reaction. Allergies often affect the nasal passages (allergic rhinitis), eyes (allergic conjunctivitis), skin (atopic dermatitis), and stomach. Allergies can be mild, moderate, or severe. They cannot spread from person to person. Allergies can develop at any age and may be outgrown. What are the causes? This condition is caused by allergens. Common allergens include:  Outdoor allergens, such as pollen, car fumes, and mold.  Indoor allergens, such as dust, smoke, mold, and pet dander.  Other allergens, such as foods, medicines, scents, insect bites or stings, and other skin irritants. What increases the risk? You are more likely to develop this condition if you have:  Family members with allergies.  Family members who have any condition that may be caused by allergens, such as asthma. This may make you more likely to have other allergies. What are the signs or symptoms? Symptoms of this condition depend on the severity of the allergy. Mild to moderate symptoms  Runny nose, stuffy nose (nasal congestion), or sneezing.  Itchy mouth, ears, or throat.  A feeling of mucus dripping down the back of your throat (postnasal drip).  Sore throat.  Itchy, red, watery, or puffy eyes.  Skin rash, or itchy, red, swollen areas of skin (hives).  Stomach cramps or bloating. Severe symptoms Severe allergies to food, medicine, or insect bites may cause anaphylaxis, which can be life-threatening. Symptoms include:  A red (flushed) face.  Wheezing or coughing.  Swollen lips, tongue, or mouth.  Tight or  swollen throat.  Chest pain or tightness, or rapid heartbeat.  Trouble breathing or shortness of breath.  Pain in the abdomen, vomiting, or diarrhea.  Dizziness or fainting. How is this diagnosed? This condition is diagnosed based on your symptoms, your family and medical history, and a physical exam. You may also have tests, including:  Skin tests to see how your skin reacts to allergens that may be causing your symptoms. Tests include: ? Skin prick test. For this test, an allergen is introduced to your body through a small opening in the skin. ? Intradermal skin test. For this test, a small amount of allergen is injected under the first layer of your skin. ? Patch test. For this test, a small amount of allergen is placed on your skin. The area is covered and then checked after a few days.  Blood tests.  A challenge test. For this test, you will eat or breathe in a small amount of allergen to see if you have an allergic reaction. You may also be asked to:  Keep a food diary. This is a record of all the foods, drinks, and symptoms you have in a day.  Try an elimination diet. To do this: ? Remove certain foods from your diet. ? Add those foods back one by one to find out if any foods cause an allergic reaction. How is this treated? Treatment for allergies depends on your symptoms. Treatment may include:  Cold, wet cloths (cold compresses) to soothe itching and swelling.  Eye drops or nasal sprays.  Nasal irrigation to help clear your mucus or keep the nasal passages moist.  A humidifier to add moisture to the   air.  Skin creams to treat rashes or itching.  Oral antihistamines or other medicines to block the reaction or to treat inflammation.  Diet changes to remove foods that cause allergies.  Being exposed again and again to tiny amounts of allergens to help you build a defense against it (tolerance). This is called immunotherapy. Examples include: ? Allergy shot. You  receive an injection that contains an allergen. ? Sublingual immunotherapy. You take a small dose of allergen under your tongue.  Emergency injection for anaphylaxis. You give yourself a shot using a syringe (auto-injector) that contains the amount of medicine you need. Your health care provider will teach you how to give yourself an injection.      Follow these instructions at home: Medicines  Take or apply over-the-counter and prescription medicines only as told by your health care provider.  Always carry your auto-injector pen if you are at risk of anaphylaxis. Give yourself an injection as told by your health care provider.   Eating and drinking  Follow instructions from your health care provider about eating or drinking restrictions.  Drink enough fluid to keep your urine pale yellow. General instructions  Wear a medical alert bracelet or necklace to let others know that you have had anaphylaxis before.  Avoid known allergens whenever possible.  Keep all follow-up visits as told by your health care provider. This is important. Contact a health care provider if:  Your symptoms do not get better with treatment. Get help right away if:  You have symptoms of anaphylaxis. These include: ? Swollen mouth, tongue, or throat. ? Pain or tightness in your chest. ? Trouble breathing or shortness of breath. ? Dizziness or fainting. ? Severe abdominal pain, vomiting, or diarrhea. These symptoms may represent a serious problem that is an emergency. Do not wait to see if the symptoms will go away. Get medical help right away. Call your local emergency services (911 in the [place] Do not drive yourself to the hospital. Summary  Take or apply over-the-counter and prescription medicines only as told by your health care provider.  Avoid known allergens when possible.  Always carry your auto-injector pen if you are at risk of anaphylaxis. Give yourself an injection as told by your health  care provider.  Wear a medical alert bracelet or necklace to let others know that you have had anaphylaxis before.  Anaphylaxis is a life-threatening emergency. Get help right away. This information is not intended to replace advice given to you by your health care provider. Make sure you discuss any questions you have with your health care provider. Document Revised: 08/20/2019 Document Reviewed: 11/01/2018 Elsevier Patient Education  2021 Elsevier Inc.  

## 2020-01-16 ENCOUNTER — Other Ambulatory Visit: Payer: Self-pay

## 2020-01-16 ENCOUNTER — Ambulatory Visit (INDEPENDENT_AMBULATORY_CARE_PROVIDER_SITE_OTHER): Payer: Medicaid Other | Admitting: Cardiovascular Disease

## 2020-01-16 ENCOUNTER — Encounter: Payer: Self-pay | Admitting: Cardiovascular Disease

## 2020-01-16 VITALS — BP 124/82 | HR 70 | Ht 68.0 in | Wt 194.0 lb

## 2020-01-16 DIAGNOSIS — I208 Other forms of angina pectoris: Secondary | ICD-10-CM

## 2020-01-16 DIAGNOSIS — I10 Essential (primary) hypertension: Secondary | ICD-10-CM | POA: Diagnosis not present

## 2020-01-16 DIAGNOSIS — I25118 Atherosclerotic heart disease of native coronary artery with other forms of angina pectoris: Secondary | ICD-10-CM | POA: Diagnosis not present

## 2020-01-16 DIAGNOSIS — J449 Chronic obstructive pulmonary disease, unspecified: Secondary | ICD-10-CM

## 2020-01-16 DIAGNOSIS — R55 Syncope and collapse: Secondary | ICD-10-CM | POA: Diagnosis not present

## 2020-01-16 DIAGNOSIS — I472 Ventricular tachycardia: Secondary | ICD-10-CM | POA: Diagnosis not present

## 2020-01-16 DIAGNOSIS — I4729 Other ventricular tachycardia: Secondary | ICD-10-CM

## 2020-01-16 MED ORDER — VARENICLINE TARTRATE 1 MG PO TABS: 1.0000 mg | ORAL_TABLET | Freq: Two times a day (BID) | ORAL | 3 refills | Status: DC

## 2020-01-16 NOTE — Patient Instructions (Addendum)
Medication Instructions:  Chantix take two times a day for smoking cessation   Lab work: None  Testing/Procedures:  Stress test , lexiscan , for angina Week from Friday: 1/21/2 [place]  Your caregiver has ordered a Stress Test with nuclear imaging. The purpose of this test is to evaluate the blood supply to your heart muscle. This procedure is referred to as a "Non-Invasive Stress Test." This is because other than having an IV started in your vein, nothing is inserted or "invades" your body. Cardiac stress tests are done to find areas of poor blood flow to the heart by determining the extent of coronary artery disease (CAD). Some patients exercise on a treadmill, which naturally increases the blood flow to your heart, while others who are  unable to walk on a treadmill due to physical limitations have a pharmacologic/chemical stress agent called Lexiscan . This medicine will mimic walking on a treadmill by temporarily increasing your coronary blood flow.   Please note: these test may take anywhere between 2-4 hours to complete  PLEASE REPORT TO [facility] AT THE FIRST DESK WILL DIRECT YOU WHERE TO GO  Date of Procedure:_____________________________________  Arrival Time for Procedure:______________________________  Instructions regarding medication:   ____ : Hold diabetes medication morning of procedure  _X__:  Hold betablocker(s) night before procedure and morning of procedure: isosorbide mononitrate (IMDUR) 30 MG 24 hr tablet the night before and the morning of the stress test                       PLEASE NOTIFY THE OFFICE AT LEAST 24 HOURS IN ADVANCE IF YOU ARE UNABLE TO KEEP YOUR APPOINTMENT.  [phone] AND  PLEASE NOTIFY NUCLEAR MEDICINE AT [facility] AT LEAST 24 HOURS IN ADVANCE IF YOU ARE UNABLE TO KEEP YOUR APPOINTMENT. [phone]  How to prepare for your Myoview test:  1. Do not eat or drink after midnight 2. No caffeine for 24 hours  prior to test 3. No smoking 24 hours prior to test. 4. Your medication may be taken with water.  If your doctor stopped a medication because of this test, do not take that medication. 5. Ladies, please do not wear dresses.  Skirts or pants are appropriate. Please wear a short sleeve shirt. 6. No perfume, cologne or lotion. 7. Wear comfortable walking shoes. No heels!    Follow-Up: At [facility], you and your health needs are our priority.  As part of our continuing mission to provide you with exceptional heart care, we have created designated Provider Care Teams.  These Care Teams include your primary Cardiologist (physician) and Advanced Practice Providers (APPs -  Physician Assistants and Nurse Practitioners) who all work together to provide you with the care you need, when you need it.  . You will need a follow up appointment in 12 months  . Providers on your designated Care Team:   . Murray Hodgkins, NP . Christell Faith, PA-C . Marrianne Mood, PA-C  Any Other Special Instructions Will Be Listed Below (If Applicable).  COVID-19 Vaccine Information can be found at: [email or web address].co.uk For questions related to vaccine distribution or appointments, please email [email or web address] or call [phone].
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
## 2020-01-17 DIAGNOSIS — M542 Cervicalgia: Secondary | ICD-10-CM | POA: Diagnosis not present

## 2020-01-22 ENCOUNTER — Encounter: Payer: Medicaid Other | Admitting: Podiatry

## 2020-01-24 DIAGNOSIS — M542 Cervicalgia: Secondary | ICD-10-CM | POA: Diagnosis not present

## 2020-01-25 ENCOUNTER — Encounter
Admission: RE | Admit: 2020-01-25 | Discharge: 2020-01-25 | Disposition: A | Discharge status: Home / Self Care | Payer: Medicaid Other | Source: Ambulatory Visit | Attending: Cardiovascular Disease | Admitting: Cardiovascular Disease

## 2020-01-25 ENCOUNTER — Other Ambulatory Visit: Payer: Self-pay

## 2020-01-25 DIAGNOSIS — I208 Other forms of angina pectoris: Secondary | ICD-10-CM

## 2020-01-25 LAB — NM MYOCAR MULTI W/SPECT W/WALL MOTION / EF
LV dias vol: 145 mL (ref 62–150)
LV sys vol: 83 mL
MPHR: 158 {beats}/min
Peak HR: 83 {beats}/min
Percent HR: 52 %
SDS: 0
SRS: 9
SSS: 6
TID: 1.04

## 2020-01-25 MED ORDER — REGADENOSON 0.4 MG/5ML IV SOLN
0.4000 mg | Freq: Once | INTRAVENOUS | Status: AC
  Administered 2020-01-25: 0.4 mg via INTRAVENOUS

## 2020-01-25 MED ORDER — TECHNETIUM TC 99M TETROFOSMIN IV KIT
10.0000 | PACK | Freq: Once | INTRAVENOUS | Status: AC | PRN
  Administered 2020-01-25: 10.84 via INTRAVENOUS

## 2020-01-25 MED ORDER — TECHNETIUM TC 99M TETROFOSMIN IV KIT
30.0000 | PACK | Freq: Once | INTRAVENOUS | Status: AC | PRN
  Administered 2020-01-25: 27.99 via INTRAVENOUS

## 2020-01-28 NOTE — Progress Notes (Signed)
PROVIDER NOTE: Information contained herein reflects review and annotations entered in association with encounter. Interpretation of such information and data should be left to medically-trained personnel. Information provided to patient can be located elsewhere in the medical record under "Patient Instructions". Document created using STT-dictation technology, any transcriptional errors that may result from process are unintentional.    Patient: Caleb [name]  Service Category: Procedure  Provider: Oswaldo Done, MD  DOB: September 24, 1955  DOS: 01/29/2020  Location: [facility]  MRN: [id number]  Setting: Ambulatory - outpatient  Referring Provider: Delano Metz, MD  Type: Established Patient  Specialty: Interventional Pain Management  PCP: Erasmo Downer, MD   Primary Reason for Visit: Interventional Pain Management Treatment. CC: Back Pain (LOW)  Procedure:          Anesthesia, Analgesia, Anxiolysis:  Type: Lumbar Facet, Medial Branch Block(s) #1  Primary Purpose: Diagnostic Region: Posterolateral Lumbosacral Spine Level: L2, L3, L4, L5, & S1 Medial Branch Level(s). Injecting these levels blocks the L3-4, L4-5, and L5-S1 lumbar facet joints. Laterality: Bilateral  Type: Moderate (Conscious) Sedation combined with Local Anesthesia Indication(s): Analgesia and Anxiety Route: Intravenous (IV) IV Access: Secured Sedation: Meaningful verbal contact was maintained at all times during the procedure  Local Anesthetic: Lidocaine 1-2%  Position: Prone   Indications: 1. Lumbar facet syndrome (Bilateral)   2. Lumbosacral facet arthropathy (L4-5, L5-S1)   3. Spondylosis without myelopathy or radiculopathy, lumbosacral region   4. DDD (degenerative disc disease), lumbosacral   5. Chronic low back pain (Bilateral) w/o sciatica    Pain Score: Pre-procedure: 3 /10 Post-procedure: 0-No pain/10   Pre-op H&P Assessment:  Mr. Weniger is a 65 y.o. (year old), male  patient, seen today for interventional treatment. He  has a past surgical history that includes Lumbar laminectomy (1989, 1999); Cervical fusion (1988, 1998); Liver surgery (2015); Partial colectomy (1990); Inguinal hernia repair (Bilateral, 1975); Lithotripsy; Colonoscopy with propofol (N/A, 02/24/2017); Cardiac catheterization (2015); Arthrodesis metatarsal (Right, 11/26/2019); and Lesion removal (Right, 11/26/2019). Mr. Seyer has a current medication list which includes the following prescription(s): anoro ellipta, aspirin ec, cetirizine, fluocinolone, fluticasone, hydrochlorothiazide, [START ON 02/01/2020] hydrocodone-acetaminophen, [START ON 03/02/2020] hydrocodone-acetaminophen, [START ON 04/01/2020] hydrocodone-acetaminophen, ibuprofen, isosorbide mononitrate, losartan, melatonin, methocarbamol, mometasone, naloxone, nitroglycerin, pregabalin, rosuvastatin, tadalafil, turmeric, and varenicline, and the following Facility-Administered Medications: fentanyl and midazolam. His primarily concern today is the Back Pain (LOW)  Initial Vital Signs:  Pulse/HCG Rate: 77ECG Heart Rate: 72 Temp: (!) 97.3 F (36.3 C) Resp: 18 BP: 127/89 SpO2: 99 %  BMI: Estimated body mass index is 30.11 kg/m as calculated from the following:   Height as of this encounter: 5\' 8"  (1.727 m).   Weight as of this encounter: 198 lb (89.8 kg).  Risk Assessment: Allergies: Reviewed. He is allergic to lisinopril and oxycodone.  Allergy Precautions: None required Coagulopathies: Reviewed. None identified.  Blood-thinner therapy: None at this time Active Infection(s): Reviewed. None identified. Mr. Nolazco is afebrile  Site Confirmation: Mr. Evert was asked to confirm the procedure and laterality before marking the site Procedure checklist: Completed Consent: Before the procedure and under the influence of no sedative(s), amnesic(s), or anxiolytics, the patient was informed of the treatment options, risks and possible  complications. To fulfill our ethical and legal obligations, as recommended by the American Medical Association's Code of Ethics, I have informed the patient of my clinical impression; the nature and purpose of the treatment or procedure; the risks, benefits, and possible complications of the intervention; the alternatives, including doing  nothing; the risk(s) and benefit(s) of the alternative treatment(s) or procedure(s); and the risk(s) and benefit(s) of doing nothing. The patient was provided information about the general risks and possible complications associated with the procedure. These may include, but are not limited to: failure to achieve desired goals, infection, bleeding, organ or nerve damage, allergic reactions, paralysis, and death. In addition, the patient was informed of those risks and complications associated to Spine-related procedures, such as failure to decrease pain; infection (i.e.: Meningitis, epidural or intraspinal abscess); bleeding (i.e.: epidural hematoma, subarachnoid hemorrhage, or any other type of intraspinal or peri-dural bleeding); organ or nerve damage (i.e.: Any type of peripheral nerve, nerve root, or spinal cord injury) with subsequent damage to sensory, motor, and/or autonomic systems, resulting in permanent pain, numbness, and/or weakness of one or several areas of the body; allergic reactions; (i.e.: anaphylactic reaction); and/or death. Furthermore, the patient was informed of those risks and complications associated with the medications. These include, but are not limited to: allergic reactions (i.e.: anaphylactic or anaphylactoid reaction(s)); adrenal axis suppression; blood sugar elevation that in diabetics may result in ketoacidosis or comma; water retention that in patients with history of congestive heart failure may result in shortness of breath, pulmonary edema, and decompensation with resultant heart failure; weight gain; swelling or edema; medication-induced  neural toxicity; particulate matter embolism and blood vessel occlusion with resultant organ, and/or nervous system infarction; and/or aseptic necrosis of one or more joints. Finally, the patient was informed that Medicine is not an exact science; therefore, there is also the possibility of unforeseen or unpredictable risks and/or possible complications that may result in a catastrophic outcome. The patient indicated having understood very clearly. We have given the patient no guarantees and we have made no promises. Enough time was given to the patient to ask questions, all of which were answered to the patient's satisfaction. Mr. Janicke has indicated that he wanted to continue with the procedure. Attestation: I, the ordering provider, attest that I have discussed with the patient the benefits, risks, side-effects, alternatives, likelihood of achieving goals, and potential problems during recovery for the procedure that I have provided informed consent. Date  Time: 01/29/2020  8:46 AM  Pre-Procedure Preparation:  Monitoring: As per clinic protocol. Respiration, ETCO2, SpO2, BP, heart rate and rhythm monitor placed and checked for adequate function Safety Precautions: Patient was assessed for positional comfort and pressure points before starting the procedure. Time-out: I initiated and conducted the "Time-out" before starting the procedure, as per protocol. The patient was asked to participate by confirming the accuracy of the "Time Out" information. Verification of the correct person, site, and procedure were performed and confirmed by me, the nursing staff, and the patient. "Time-out" conducted as per Joint Commission's Universal Protocol (UP.01.01.01). Time: 0903  Description of Procedure:          Laterality: Bilateral. The procedure was performed in identical fashion on both sides. Levels:  L2, L3, L4, L5, & S1 Medial Branch Level(s) Area Prepped: Posterior Lumbosacral Region DuraPrep (Iodine  Povacrylex [0.7% available iodine] and Isopropyl Alcohol, 74% w/w) Safety Precautions: Aspiration looking for blood return was conducted prior to all injections. At no point did we inject any substances, as a needle was being advanced. Before injecting, the patient was told to immediately notify me if he was experiencing any new onset of "ringing in the ears, or metallic taste in the mouth". No attempts were made at seeking any paresthesias. Safe injection practices and needle disposal techniques used. Medications properly  checked for expiration dates. SDV (single dose vial) medications used. After the completion of the procedure, all disposable equipment used was discarded in the proper designated medical waste containers. Local Anesthesia: Protocol guidelines were followed. The patient was positioned over the fluoroscopy table. The area was prepped in the usual manner. The time-out was completed. The target area was identified using fluoroscopy. A 12-in long, straight, sterile hemostat was used with fluoroscopic guidance to locate the targets for each level blocked. Once located, the skin was marked with an approved surgical skin marker. Once all sites were marked, the skin (epidermis, dermis, and hypodermis), as well as deeper tissues (fat, connective tissue and muscle) were infiltrated with a small amount of a short-acting local anesthetic, loaded on a 10cc syringe with a 25G, 1.5-in  Needle. An appropriate amount of time was allowed for local anesthetics to take effect before proceeding to the next step. Local Anesthetic: Lidocaine 2.0% The unused portion of the local anesthetic was discarded in the proper designated containers. Technical explanation of process:  L2 Medial Branch Nerve Block (MBB): The target area for the L2 medial branch is at the junction of the postero-lateral aspect of the superior articular process and the superior, posterior, and medial edge of the transverse process of L3. Under  fluoroscopic guidance, a Quincke needle was inserted until contact was made with os over the superior postero-lateral aspect of the pedicular shadow (target area). After negative aspiration for blood, 0.5 mL of the nerve block solution was injected without difficulty or complication. The needle was removed intact. L3 Medial Branch Nerve Block (MBB): The target area for the L3 medial branch is at the junction of the postero-lateral aspect of the superior articular process and the superior, posterior, and medial edge of the transverse process of L4. Under fluoroscopic guidance, a Quincke needle was inserted until contact was made with os over the superior postero-lateral aspect of the pedicular shadow (target area). After negative aspiration for blood, 0.5 mL of the nerve block solution was injected without difficulty or complication. The needle was removed intact. L4 Medial Branch Nerve Block (MBB): The target area for the L4 medial branch is at the junction of the postero-lateral aspect of the superior articular process and the superior, posterior, and medial edge of the transverse process of L5. Under fluoroscopic guidance, a Quincke needle was inserted until contact was made with os over the superior postero-lateral aspect of the pedicular shadow (target area). After negative aspiration for blood, 0.5 mL of the nerve block solution was injected without difficulty or complication. The needle was removed intact. L5 Medial Branch Nerve Block (MBB): The target area for the L5 medial branch is at the junction of the postero-lateral aspect of the superior articular process and the superior, posterior, and medial edge of the sacral ala. Under fluoroscopic guidance, a Quincke needle was inserted until contact was made with os over the superior postero-lateral aspect of the pedicular shadow (target area). After negative aspiration for blood, 0.5 mL of the nerve block solution was injected without difficulty or  complication. The needle was removed intact. S1 Medial Branch Nerve Block (MBB): The target area for the S1 medial branch is at the posterior and inferior 6 o'clock position of the L5-S1 facet joint. Under fluoroscopic guidance, the Quincke needle inserted for the L5 MBB was redirected until contact was made with os over the inferior and postero aspect of the sacrum, at the 6 o' clock position under the L5-S1 facet joint (Target area). After  negative aspiration for blood, 0.5 mL of the nerve block solution was injected without difficulty or complication. The needle was removed intact.  Nerve block solution: 0.2% PF-Ropivacaine + Triamcinolone (40 mg/mL) diluted to a final concentration of 4 mg of Triamcinolone/mL of Ropivacaine The unused portion of the solution was discarded in the proper designated containers. Procedural Needles: 22-gauge, 3.5-inch, Quincke needles used for all levels.  Once the entire procedure was completed, the treated area was cleaned, making sure to leave some of the prepping solution back to take advantage of its long term bactericidal properties.   Illustration of the posterior view of the lumbar spine and the posterior neural structures. Laminae of L2 through S1 are labeled. DPRL5, dorsal primary ramus of L5; DPRS1, dorsal primary ramus of S1; DPR3, dorsal primary ramus of L3; FJ, facet (zygapophyseal) joint L3-L4; I, inferior articular process of L4; LB1, lateral branch of dorsal primary ramus of L1; IAB, inferior articular branches from L3 medial branch (supplies L4-L5 facet joint); IBP, intermediate branch plexus; MB3, medial branch of dorsal primary ramus of L3; NR3, third lumbar nerve root; S, superior articular process of L5; SAB, superior articular branches from L4 (supplies L4-5 facet joint also); TP3, transverse process of L3.  Vitals:   01/29/20 0913 01/29/20 0923 01/29/20 0933 01/29/20 0943  BP: 109/74 107/79 105/81 110/84  Pulse:      Resp: 18 19 16 16   Temp:   (!) 97.3 F (36.3 C)  (!) 97.4 F (36.3 C)  SpO2: 95% 99% 99% 99%  Weight:      Height:         Start Time: 0903 hrs. End Time: 0913 hrs.  Imaging Guidance (Spinal):          Type of Imaging Technique: Fluoroscopy Guidance (Spinal) Indication(s): Assistance in needle guidance and placement for procedures requiring needle placement in or near specific anatomical locations not easily accessible without such assistance. Exposure Time: Please see nurses notes. Contrast: None used. Fluoroscopic Guidance: I was personally present during the use of fluoroscopy. "Tunnel Vision Technique" used to obtain the best possible view of the target area. Parallax error corrected before commencing the procedure. "Direction-depth-direction" technique used to introduce the needle under continuous pulsed fluoroscopy. Once target was reached, antero-posterior, oblique, and lateral fluoroscopic projection used confirm needle placement in all planes. Images permanently stored in EMR. Interpretation: No contrast injected. I personally interpreted the imaging intraoperatively. Adequate needle placement confirmed in multiple planes. Permanent images saved into the patient's record.  Antibiotic Prophylaxis:   Anti-infectives (From admission, onward)   None     Indication(s): None identified  Post-operative Assessment:  Post-procedure Vital Signs:  Pulse/HCG Rate: 7768 Temp: (!) 97.4 F (36.3 C) Resp: 16 BP: 110/84 SpO2: 99 %  EBL: None  Complications: No immediate post-treatment complications observed by team, or reported by patient.  Note: The patient tolerated the entire procedure well. A repeat set of vitals were taken after the procedure and the patient was kept under observation following institutional policy, for this type of procedure. Post-procedural neurological assessment was performed, showing return to baseline, prior to discharge. The patient was provided with post-procedure discharge  instructions, including a section on how to identify potential problems. Should any problems arise concerning this procedure, the patient was given instructions to immediately contact us, at any time, without hesitation. In any case, we plan to contact the patient by telephone for a follow-up status report regarding this interventional procedure.  Comments:  No additional relevant information.  Plan of Care  Orders:  Orders Placed This Encounter  Procedures  . LUMBAR FACET(MEDIAL BRANCH NERVE BLOCK) MBNB    Scheduling Instructions:     Procedure: Lumbar facet block (AKA.: Lumbosacral medial branch nerve block)     Side: Bilateral     Level: L3-4, L4-5, & L5-S1 Facets (L2, L3, L4, L5, & S1 Medial Branch Nerves)     Sedation: Patient's choice.     Timeframe: Today    Order Specific Question:   Where will this procedure be performed?    Answer:   [facility]  . DG [facility] C-ARM 1-60 MIN NO REPORT    Intraoperative interpretation by procedural physician at [facility].    Standing Status:   Standing    Number of Occurrences:   1    Order Specific Question:   Reason for exam:    Answer:   Assistance in needle guidance and placement for procedures requiring needle placement in or near specific anatomical locations not easily accessible without such assistance.  . Informed Consent Details: Physician/Practitioner Attestation; Transcribe to consent form and obtain patient signature    Nursing Order: Transcribe to consent form and obtain patient signature. Note: Always confirm laterality of pain with Mr. Coro, before procedure.    Order Specific Question:   Physician/Practitioner attestation of informed consent for procedure/surgical case    Answer:   I, the physician/practitioner, attest that I have discussed with the patient the benefits, risks, side effects, alternatives, likelihood of achieving goals and potential problems during recovery for the procedure that I have  provided informed consent.    Order Specific Question:   Procedure    Answer:   Lumbar Facet Block  under fluoroscopic guidance    Order Specific Question:   Physician/Practitioner performing the procedure    Answer:   Axzel Rockhill A. Laban Emperor MD    Order Specific Question:   Indication/Reason    Answer:   Low Back Pain, with our without leg pain, due to Facet Joint Arthralgia (Joint Pain) Spondylosis (Arthritis of the Spine), without myelopathy or radiculopathy (Nerve Damage).  . Provide equipment / supplies at bedside    "Block Tray" (Disposable  single use) Needle type: SpinalSpinal Amount/quantity: 4 Size: Regular (3.5-inch) Gauge: 22G    Standing Status:   Standing    Number of Occurrences:   1    Order Specific Question:   Specify    Answer:   Block Tray   Chronic Opioid Analgesic:  Hydrocodone/APAP 5/325 1 tablet every 6 hours (20 mg/day of hydrocodone).  (Unable to tolerate an oxycodone IR trial due to stomachaches, nausea, headaches, and excessive somnolence.  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   Medications ordered for procedure: Meds ordered this encounter  Medications  . lidocaine (XYLOCAINE) 2 % (with pres) injection 400 mg  . lactated ringers infusion 1,000 mL  . midazolam (VERSED) 5 MG/5ML injection 1-2 mg    Make sure Flumazenil is available in the pyxis when using this medication. If oversedation occurs, administer 0.2 mg IV over 15 sec. If after 45 sec no response, administer 0.2 mg again over 1 min; may repeat at 1 min intervals; not to exceed 4 doses (1 mg)  . fentaNYL (SUBLIMAZE) injection 25-50 mcg    Make sure Narcan is available in the pyxis when using this medication. In the event of respiratory depression (RR< 8/min): Titrate NARCAN (naloxone) in increments of 0.1 to 0.2 mg IV at 2-3  minute intervals, until desired degree of reversal.  . ropivacaine (PF) 2 mg/mL (0.2%) (NAROPIN) injection 18 mL  . triamcinolone acetonide  (KENALOG-40) injection 80 mg   Medications administered: We administered lidocaine, lactated ringers, midazolam, fentaNYL, ropivacaine (PF) 2 mg/mL (0.2%), and triamcinolone acetonide.  See the medical record for exact dosing, route, and time of administration.  Follow-up plan:   Return in about 2 weeks (around 02/12/2020) for (F2F), (PP) Follow-up.      Interventional Therapies  Risk  Complexity Considerations:   WNL   Planned  Pending:   Diagnostic bilateral lumbar facet MBB #1    Under consideration:   Possible spinal cord stimulator trial  Possible bilateral cervical facet RFA   Completed:   Therapeutic left CESI x6(07/03/2019)  Diagnostic bilateral cervical facet blockx2(09/22/2017)  Diagnostic rightCommon Peroneal NB (C-PNB)x2(04/14/2017)  Therapeutic right common peroneal nerve (C-PN) RFA x1(06/21/2017) Diagnostic right superficial peroneal NB (S-PNB) x4 (09/13/2019)  Therapeutic right superficial peroneal nerve (S-PN) RFA x1 (10/25/2017)  Therapeutic right deep peroneal NB (D-PNB) x1 (05/03/2019)  Diagnostic left SI joint block x1 (06/19/2019)  Therapeutic midline serratus posterior TPI/MNB x1 (01/11/2019)    Therapeutic  Palliative (PRN) options:   Palliative left CESI #7 Diagnostic bilateral cervical facet block#3 Palliative right superficial peroneal NB #5  Palliative right superficial peroneal nerve RFA #2     Recent Visits Date Type Provider Dept  01/07/20 Office Visit Delano Metz, MD [facility]  11/21/19 Office Visit Delano Metz, MD [facility]  Showing recent visits within past 90 days and meeting all other requirements Today's Visits Date Type Provider Dept  01/29/20 Procedure visit Delano Metz, MD [facility]  Showing today's visits and meeting all other requirements Future Appointments Date Type Provider Dept  02/11/20 Appointment Delano Metz, MD [facility]  02/27/20  Appointment Delano Metz, MD [facility]  Showing future appointments within next 90 days and meeting all other requirements  Disposition: Discharge home  Discharge (Date  Time): 01/29/2020; 0948 hrs.   Primary Care Physician: Erasmo Downer, MD Location: [facility] Note by: Oswaldo Done, MD Date: 01/29/2020; Time: 1:13 PM  Disclaimer:  Medicine is not an Visual merchandiser. The only guarantee in medicine is that nothing is guaranteed. It is important to note that the decision to proceed with this intervention was based on the information collected from the patient. The Data and conclusions were drawn from the patient's questionnaire, the interview, and the physical examination. Because the information was provided in large part by the patient, it cannot be guaranteed that it has not been purposely or unconsciously manipulated. Every effort has been made to obtain as much relevant data as possible for this evaluation. It is important to note that the conclusions that lead to this procedure are derived in large part from the available data. Always take into account that the treatment will also be dependent on availability of resources and existing treatment guidelines, considered by other Pain Management Practitioners as being common knowledge and practice, at the time of the intervention. For Medico-Legal purposes, it is also important to point out that variation in procedural techniques and pharmacological choices are the acceptable norm. The indications, contraindications, technique, and results of the above procedure should only be interpreted and judged by a Board-Certified Interventional Pain Specialist with extensive familiarity and expertise in the same exact procedure and technique.

## 2020-01-29 ENCOUNTER — Ambulatory Visit (HOSPITAL_BASED_OUTPATIENT_CLINIC_OR_DEPARTMENT_OTHER): Payer: Medicaid Other | Admitting: Pain Medicine

## 2020-01-29 ENCOUNTER — Ambulatory Visit
Admission: RE | Admit: 2020-01-29 | Discharge: 2020-01-29 | Disposition: A | Discharge status: Home / Self Care | Payer: Medicaid Other | Source: Ambulatory Visit | Attending: Pain Medicine | Admitting: Pain Medicine

## 2020-01-29 ENCOUNTER — Other Ambulatory Visit: Payer: Self-pay

## 2020-01-29 ENCOUNTER — Encounter: Payer: Self-pay | Admitting: Pain Medicine

## 2020-01-29 VITALS — BP 110/84 | HR 77 | Temp 97.4°F | Resp 16 | Ht 68.0 in | Wt 198.0 lb

## 2020-01-29 DIAGNOSIS — M47817 Spondylosis without myelopathy or radiculopathy, lumbosacral region: Secondary | ICD-10-CM

## 2020-01-29 DIAGNOSIS — M545 Low back pain, unspecified: Secondary | ICD-10-CM | POA: Diagnosis present

## 2020-01-29 DIAGNOSIS — M47816 Spondylosis without myelopathy or radiculopathy, lumbar region: Secondary | ICD-10-CM

## 2020-01-29 DIAGNOSIS — M5137 Other intervertebral disc degeneration, lumbosacral region: Secondary | ICD-10-CM | POA: Diagnosis present

## 2020-01-29 DIAGNOSIS — G8929 Other chronic pain: Secondary | ICD-10-CM | POA: Insufficient documentation

## 2020-01-29 MED ORDER — LACTATED RINGERS IV SOLN
1000.0000 mL | Freq: Once | INTRAVENOUS | Status: AC
  Administered 2020-01-29: 1000 mL via INTRAVENOUS

## 2020-01-29 MED ORDER — MIDAZOLAM HCL 5 MG/5ML IJ SOLN
INTRAMUSCULAR | Status: AC
  Filled 2020-01-29: qty 5

## 2020-01-29 MED ORDER — ROPIVACAINE HCL 2 MG/ML IJ SOLN
INTRAMUSCULAR | Status: AC
  Filled 2020-01-29: qty 20

## 2020-01-29 MED ORDER — LIDOCAINE HCL 2 % IJ SOLN
20.0000 mL | Freq: Once | INTRAMUSCULAR | Status: AC
  Administered 2020-01-29: 100 mg
  Filled 2020-01-29: qty 20

## 2020-01-29 MED ORDER — MIDAZOLAM HCL 5 MG/5ML IJ SOLN
1.0000 mg | INTRAMUSCULAR | Status: DC | PRN
  Administered 2020-01-29: 3 mg via INTRAVENOUS

## 2020-01-29 MED ORDER — FENTANYL CITRATE (PF) 100 MCG/2ML IJ SOLN
25.0000 ug | INTRAMUSCULAR | Status: DC | PRN
  Administered 2020-01-29: 50 ug via INTRAVENOUS

## 2020-01-29 MED ORDER — ROPIVACAINE HCL 2 MG/ML IJ SOLN
18.0000 mL | Freq: Once | INTRAMUSCULAR | Status: AC
  Administered 2020-01-29: 18 mL via PERINEURAL

## 2020-01-29 MED ORDER — FENTANYL CITRATE (PF) 100 MCG/2ML IJ SOLN
INTRAMUSCULAR | Status: AC
  Filled 2020-01-29: qty 2

## 2020-01-29 MED ORDER — TRIAMCINOLONE ACETONIDE 40 MG/ML IJ SUSP
INTRAMUSCULAR | Status: AC
  Filled 2020-01-29: qty 2

## 2020-01-29 MED ORDER — TRIAMCINOLONE ACETONIDE 40 MG/ML IJ SUSP
80.0000 mg | Freq: Once | INTRAMUSCULAR | Status: AC
  Administered 2020-01-29: 80 mg

## 2020-01-29 NOTE — Patient Instructions (Signed)

## 2020-01-29 NOTE — Progress Notes (Signed)
Safety precautions to be maintained throughout the outpatient stay will include: orient to surroundings, keep bed in low position, maintain call bell within reach at all times, provide assistance with transfer out of bed and ambulation.  

## 2020-01-30 ENCOUNTER — Telehealth: Payer: Self-pay

## 2020-01-30 NOTE — Telephone Encounter (Signed)
Post procedure follow up.  Patient states he is doing well 

## 2020-01-31 DIAGNOSIS — M542 Cervicalgia: Secondary | ICD-10-CM | POA: Diagnosis not present

## 2020-02-01 ENCOUNTER — Other Ambulatory Visit: Payer: Self-pay | Admitting: Pain Medicine

## 2020-02-01 DIAGNOSIS — G8929 Other chronic pain: Secondary | ICD-10-CM

## 2020-02-10 NOTE — Progress Notes (Signed)
PROVIDER NOTE: Information contained herein reflects review and annotations entered in association with encounter. Interpretation of such information and data should be left to medically-trained personnel. Information provided to patient can be located elsewhere in the medical record under "Patient Instructions". Document created using STT-dictation technology, any transcriptional errors that may result from process are unintentional.    Patient: Caleb [name]  Service Category: E/M  Provider: Gaspar Cola, MD  DOB: 09-07-1955  DOS: 02/11/2020  Specialty: Interventional Pain Management  MRN: [id number]  Setting: Ambulatory outpatient  PCP: Virginia Crews, MD  Type: Established Patient    Referring Provider: Virginia Crews, MD  Location: Office  Delivery: Face-to-face     HPI  [name], a 65 y.o. year old male, is here today because of his Chronic pain syndrome [G89.4]. Mr. Mottola primary complain today is Back Pain Last encounter: My last encounter with him was on 02/01/2020. Pertinent problems: Mr. 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); Cervicalgia; DDD (degenerative disc disease), thoracic; Disorder of superficial peroneal nerve (Right); Chronic upper extremity pain (Left); Cervical spondylosis w/ radiculopathy; Cervical disc disorder with radiculopathy of cervical region; Chronic upper extremity weakness (Left); Abnormal nerve conduction studies (06/08/2017); 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); Spondylosis without myelopathy or radiculopathy, cervical region; Cervical facet  syndrome (Bilateral); Abnormal MRI, cervical spine (10/02/2019 & 08/13/2017); History of fusion of cervical spine; Chronic neck pain (4th area of Pain) (Bilateral) (L>R); Chronic neck pain with history of cervical spinal surgery; LLQ abdominal pain; Neuropathic pain; Nondisplaced fracture of proximal phalanx of left thumb with routine healing; 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 Pain Assessment: Severity of Chronic pain is reported as a 1 /10. Location: Back Lower/denies. Onset: More than a month ago. Quality: Dull. Timing: Intermittent. Modifying factor(s): Back injections. Vitals:  height is _0  (1.727 m) and weight is 200 lb (90.7 kg). His temporal temperature is 97 F (36.1 C) (abnormal). His blood pressure is 118/69 and his pulse is 76. His respiration is 16 and oxygen saturation is 100%.   Reason for encounter: post-procedure assessment.   The patient indicates doing well with the current medication regimen. No adverse reactions or side effects reported to the medications.  The patient attained 100% relief of the pain for the duration of the local anesthetic which then went down to an ongoing 90% relief.  This confirms that we are in fact dealing with facet joint arthralgia as the etiology of his low back pain.  Today I have talked to him about the possibility of repeating the diagnostic injection should the pain start coming back again.  I also  talked to him about the possibility of doing radiofrequency ablation for this type of problem.  He is well aware of what radiofrequency ablation has to offer.  He  has had treatments with radiofrequency in his lower extremities with excellent long-term benefit.  Currently he is pending possible cervical spine surgery to help with his cervical radiculopathy.  Unfortunately, he is also having some problems with his medication where his pharmacy has told him that rather than providing him with the complete 30-day prescription as we have written it, they can give him 2 weeks worth of medicine for $6, but if he wants to have the full month, then he has to pay $64.  I am not sure where this is coming from, but he was told that this is secondary to a mandated by the "FDA".  I know for a fact that this is not true.  I am not sure what is going on, but I have recommended that he contact the [facility] to handle this at a higher level.  He used to have absolutely no problems with his medicines until recently when this issue started.  He has chronic pain and therefore the [place] does not apply.  RTCB: 05/01/2020  Nonopioids transfer 11/21/2019: Lyrica and Robaxin  Post-Procedure Evaluation  Procedure (02/01/2020): Diagnostic bilateral lumbar facet block #1 under fluoroscopic guidance and IV sedation Pre-procedure pain level: 3/10 Post-procedure: 0/10          Sedation: Please see nurses note.  Effectiveness during initial hour after procedure(Ultra-Short Term Relief): 100 %.  Local anesthetic used: Long-acting (4-6 hours) Effectiveness: Defined as any analgesic benefit obtained secondary to the administration of local anesthetics. This carries significant diagnostic value as to the etiological location, or anatomical origin, of the pain. Duration of benefit is expected to coincide with the duration of the local anesthetic used.  Effectiveness during initial 4-6 hours  after procedure(Short-Term Relief): 100 %.  Long-term benefit: Defined as any relief past the pharmacologic duration of the local anesthetics.  Effectiveness past the initial 6 hours after procedure(Long-Term Relief): 90 %.  Current benefits: Defined as benefit that persist at this time.   Analgesia:  He refers having a 90% ongoing relief of the low back pain from the diagnostic bilateral lumbar facet block.  This confirms that but were dealing with this a lumbar facet syndrome.  Specifically he had pain 100% relief of the pain for the duration of the local anesthetic which confirms the etiology of this pain. Function: Mr. Lamp reports improvement in function ROM: Mr. Nunziato reports improvement in ROM  Pharmacotherapy Assessment   Analgesic: Hydrocodone/APAP 5/325 1 tablet every 6 hours (20 mg/day of hydrocodone).  (Unable to tolerate an oxycodone IR trial due to stomachaches, nausea, headaches, and excessive somnolence.  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   Monitoring:  PMP: PDMP reviewed during this encounter.       Pharmacotherapy: No side-effects or adverse reactions reported. Compliance: No problems identified. Effectiveness: Clinically acceptable.  Janne Napoleon, RN  02/11/2020 10:05 AM  Sign when Signing Visit Safety precautions to be maintained throughout the outpatient stay will include: orient to surroundings, keep bed in low position, maintain call bell within reach at all times, provide assistance with transfer out of bed and ambulation.   Nursing Pain Medication Assessment:  Safety precautions to be maintained throughout the outpatient stay will include: orient to surroundings, keep bed in low position, maintain call bell within reach at all times, provide assistance with transfer out of bed and ambulation.  Medication Inspection Compliance: Pill count conducted under aseptic conditions, in front of the patient. Neither  the pills  nor the bottle was removed from the patient's sight at any time. Once count was completed pills were immediately returned to the patient in their original bottle.  Medication: Hydrocodone/APAP Pill/Patch Count: 85 of 120 pills remain Pill/Patch Appearance: Markings consistent with prescribed medication Bottle Appearance: Standard pharmacy container. Clearly labeled. Filled Date: 1 / 47 / 2022 Last Medication intake:  Today    UDS:  Summary  Date Value Ref Range Status  09/11/2019 Note  Final    Comment:    ==================================================================== [employer] [facility] [employer] (MW) ==================================================================== Test                             Result       Flag       Units  Drug Present and Declared for Prescription Verification   Hydrocodone                    1156         EXPECTED   ng/mg creat   Hydromorphone                  500          EXPECTED   ng/mg creat   Dihydrocodeine                 90           EXPECTED   ng/mg creat   Norhydrocodone                 1409         EXPECTED   ng/mg creat    Sources of hydrocodone include scheduled prescription medications.    Hydromorphone, dihydrocodeine and norhydrocodone are expected    metabolites of hydrocodone. Hydromorphone and dihydrocodeine are    also available as scheduled prescription medications.  Drug Present not Declared for Prescription Verification   Tramadol                       370          UNEXPECTED ng/mg creat   O-Desmethyltramadol            545          UNEXPECTED ng/mg creat   N-Desmethyltramadol            48           UNEXPECTED ng/mg creat    Source of tramadol is a prescription medication. O-desmethyltramadol    and N-desmethyltramadol are expected metabolites of tramadol.  ==================================================================== Test                      Result    Flag   Units      Ref Range   Creatinine              176               mg/dL      >=20 ==================================================================== Declared Medications:  The flagging and interpretation on this report are based on the  following declared medications.  Unexpected results may arise from  inaccuracies in the declared medications.   **Note: The testing scope of this panel includes these medications:   Hydrocodone (Norco)   **Note: The testing scope of this panel does not include the  following reported medications:   Acetaminophen (Norco)  Aspirin  Cetirizine (Zyrtec)  Doxycycline  Fluticasone (Flonase)  Hydrochlorothiazide  Hydrocortisone  Isosorbide (Imdur)  Losartan (Cozaar)  Methocarbamol (Robaxin)  Mometasone  Naloxone (Narcan)  Neomycin  Nitroglycerin (Nitrostat)  Polymyxin B (Polymyxin)  Pregabalin (Lyrica)  Rosuvastatin (Crestor)  Supplement  Tadalafil (Cialis)  Topical  Topical Lidocaine (Lidoderm)  Umeclidinium (Anoro)  Vilanterol (Anoro) ==================================================================== For clinical consultation, please call [phone]. ====================================================================      ROS  Constitutional: Denies any fever or chills Gastrointestinal: No reported hemesis, hematochezia, vomiting, or acute GI distress Musculoskeletal: Denies any acute onset joint swelling, redness, loss of ROM, or weakness Neurological: No reported episodes of acute onset apraxia, aphasia, dysarthria, agnosia, amnesia, paralysis, loss of coordination, or loss of consciousness  Medication Review  HYDROcodone-acetaminophen, Melatonin, Turmeric, aspirin EC, cetirizine, fluocinolone, fluticasone, hydrochlorothiazide, ibuprofen, isosorbide mononitrate, losartan, methocarbamol, mometasone, naloxone, nitroGLYCERIN, pregabalin, rosuvastatin, tadalafil, umeclidinium-vilanterol, and varenicline  History Review  Allergy: Mr. Fearn is allergic to lisinopril and oxycodone. Drug: Mr.  Marte  reports no history of drug use. Alcohol:  reports current alcohol use of about 4.0 standard drinks of alcohol per week. Tobacco:  reports that he has quit smoking. His smoking use included cigarettes. He has a 38.25 pack-year smoking history. He has never used smokeless tobacco. Social: Mr. Wafer  reports that he has quit smoking. His smoking use included cigarettes. He has a 38.25 pack-year smoking history. He has never used smokeless tobacco. He reports current alcohol use of about 4.0 standard drinks of alcohol per week. He reports that he does not use drugs. Medical:  has a past medical history of Chronic foot pain, right (2015), COPD (chronic obstructive pulmonary disease) ([place]), Coronary artery disease, Cough syncope, Emphysema lung ([place]), Family history of adverse reaction to anesthesia, History of kidney stones, Hyperlipidemia, Hypertension, Kidney stone, Leucocytosis, Myocardial infarction ([place]) (2015), and OSA on CPAP. Surgical: Mr. Sime  has a past surgical history that includes Lumbar laminectomy (1989, 1999); Cervical fusion (1988, 1998); Liver surgery (2015); Partial colectomy (1990); Inguinal hernia repair (Bilateral, 1975); Lithotripsy; Colonoscopy with propofol (N/A, 02/24/2017); Cardiac catheterization (2015); Arthrodesis metatarsal (Right, 11/26/2019); and Lesion removal (Right, 11/26/2019). Family: family history includes Alzheimer's disease in his paternal grandmother; Alzheimer's disease (age of onset: 39) in his father; Breast cancer in his maternal uncle; CAD in his mother; Dementia in his father; Diabetes in his maternal grandmother; Healthy in his sister; Heart attack in his maternal uncle; Heart failure in his maternal grandmother; Heart failure (age of onset: 47) in his mother; Non-Hodgkin's lymphoma in his sister.  Laboratory Chemistry Profile   Renal Lab Results  Component Value Date   BUN 14 11/20/2019   CREATININE 0.79 11/20/2019   BCR 18 09/20/2019   GFRAA  112 09/20/2019   GFRNONAA >60 11/20/2019     Hepatic Lab Results  Component Value Date   AST 15 09/20/2019   ALT 14 09/20/2019   ALBUMIN 4.1 09/20/2019   ALKPHOS 67 09/20/2019     Electrolytes Lab Results  Component Value Date   NA 135 11/20/2019   K 3.9 11/20/2019   CL 99 11/20/2019   CALCIUM 9.2 11/20/2019   MG 2.0 05/30/2018     Bone Lab Results  Component Value Date   25OHVITD1 32 02/17/2017   25OHVITD2 <1.0 02/17/2017   25OHVITD3 32 02/17/2017   TESTOFREE 5.2 (L) 02/28/2017   TESTOSTERONE 279 02/28/2017     Inflammation (CRP: Acute Phase) (ESR: Chronic Phase) Lab Results  Component Value Date   CRP 8 11/07/2017   ESRSEDRATE 26 11/07/2017  Note: Above Lab results reviewed.  Recent Imaging Review  DG PAIN CLINIC C-ARM 1-60 MIN NO REPORT Fluoro was used, but no Radiologist interpretation will be provided.  Please refer to "NOTES" tab for provider progress note. Note: Reviewed        Physical Exam  General appearance: Well nourished, well developed, and well hydrated. In no apparent acute distress Mental status: Alert, oriented x 3 (person, place, & time)       Respiratory: No evidence of acute respiratory distress Eyes: PERLA Vitals: BP 118/69 (BP Location: Right Arm, Patient Position: Sitting, Cuff Size: Large)   Pulse 76   Temp (!) 97 F (36.1 C) (Temporal)   Resp 16   Ht _0  (1.727 m)   Wt 200 lb (90.7 kg)   SpO2 100%   BMI 30.41 kg/m  BMI: Estimated body mass index is 30.41 kg/m as calculated from the following:   Height as of this encounter: _1  (1.727 m).   Weight as of this encounter: 200 lb (90.7 kg). Ideal: Ideal body weight: 68.4 kg (150 lb 12.7 oz) Adjusted ideal body weight: 77.3 kg (170 lb 7.6 oz)  Assessment   Status Diagnosis  Controlled Improved Controlled 1. Chronic pain syndrome   2. Chronic low back pain (Bilateral) w/o sciatica   3. Lumbar facet syndrome (Bilateral)   4. Chronic sacroiliac joint pain  (Bilateral)   5. Chronic hip pain (Bilateral)   6. Chronic ankle pain (2ry area of Pain) (Right)   7. Chronic foot pain (1ry area of Pain) (Right)   8. Chronic neck pain with history of cervical spinal surgery   9. Pharmacologic therapy   10. Uncomplicated opioid dependence ([place])      Updated Problems: No problems updated.  Plan of Care  Problem-specific:  No problem-specific Assessment & Plan notes found for this encounter.  Mr. JAYCEE PELZER has a current medication list which includes the following long-term medication(s): cetirizine, fluticasone, hydrochlorothiazide, hydrocodone-acetaminophen, [START ON 03/02/2020] hydrocodone-acetaminophen, [START ON 04/01/2020] hydrocodone-acetaminophen, isosorbide mononitrate, losartan, methocarbamol, nitroglycerin, pregabalin, rosuvastatin, and tadalafil.  Pharmacotherapy (Medications Ordered): No orders of the defined types were placed in this encounter.  Orders:  No orders of the defined types were placed in this encounter.  Follow-up plan:   Return in about 3 months (around 05/01/2020) for (F2F), (Med Mgmt).      Interventional Therapies  Risk  Complexity Considerations:   WNL   Planned  Pending:   Diagnostic bilateral lumbar facet MBB #1    Under consideration:   Possible spinal cord stimulator trial  Possible bilateral cervical facet RFA   Completed:   Therapeutic left CESI x6(07/03/2019)  Diagnostic bilateral cervical facet blockx2(09/22/2017)  Diagnostic rightCommon Peroneal NB (C-PNB)x2(04/14/2017)  Therapeutic right common peroneal nerve (C-PN) RFA x1(06/21/2017) Diagnostic right superficial peroneal NB (S-PNB) x4 (09/13/2019)  Therapeutic right superficial peroneal nerve (S-PN) RFA x1 (10/25/2017)  Therapeutic right deep peroneal NB (D-PNB) x1 (05/03/2019)  Diagnostic left SI joint block x1 (06/19/2019)  Therapeutic midline serratus posterior TPI/MNB x1 (01/11/2019)    Therapeutic  Palliative (PRN) options:    Palliative left CESI #7 Diagnostic bilateral cervical facet block#3 Palliative right superficial peroneal NB #5  Palliative right superficial peroneal nerve RFA #2     Recent Visits Date Type Provider Dept  01/29/20 Procedure visit Milinda Pointer, MD [facility]  01/07/20 Office Visit Milinda Pointer, MD [facility]  11/21/19 Office Visit Milinda Pointer, MD [facility]  Showing recent visits within past 3  days and meeting all other requirements Today's Visits Date Type Provider Dept  02/11/20 Office Visit Milinda Pointer, MD [facility]  Showing today's visits and meeting all other requirements Future Appointments Date Type Provider Dept  02/27/20 Appointment Milinda Pointer, MD [facility]  Showing future appointments within next 90 days and meeting all other requirements  I discussed the assessment and treatment plan with the patient. The patient was provided an opportunity to ask questions and all were answered. The patient agreed with the plan and demonstrated an understanding of the instructions.  Patient advised to call back or seek an in-person evaluation if the symptoms or condition worsens.  Duration of encounter: 30 minutes.  Note by: Gaspar Cola, MD Date: 02/11/2020; Time: 10:54 AM

## 2020-02-11 ENCOUNTER — Encounter: Payer: Self-pay | Admitting: Pain Medicine

## 2020-02-11 ENCOUNTER — Ambulatory Visit: Payer: Medicaid Other | Attending: Pain Medicine | Admitting: Pain Medicine

## 2020-02-11 ENCOUNTER — Other Ambulatory Visit: Payer: Self-pay

## 2020-02-11 VITALS — BP 118/69 | HR 76 | Temp 97.0°F | Resp 16 | Ht 68.0 in | Wt 200.0 lb

## 2020-02-11 DIAGNOSIS — M25571 Pain in right ankle and joints of right foot: Secondary | ICD-10-CM | POA: Insufficient documentation

## 2020-02-11 DIAGNOSIS — M533 Sacrococcygeal disorders, not elsewhere classified: Secondary | ICD-10-CM | POA: Insufficient documentation

## 2020-02-11 DIAGNOSIS — M25551 Pain in right hip: Secondary | ICD-10-CM | POA: Insufficient documentation

## 2020-02-11 DIAGNOSIS — G894 Chronic pain syndrome: Secondary | ICD-10-CM | POA: Insufficient documentation

## 2020-02-11 DIAGNOSIS — G8928 Other chronic postprocedural pain: Secondary | ICD-10-CM | POA: Diagnosis not present

## 2020-02-11 DIAGNOSIS — G8929 Other chronic pain: Secondary | ICD-10-CM | POA: Insufficient documentation

## 2020-02-11 DIAGNOSIS — M47816 Spondylosis without myelopathy or radiculopathy, lumbar region: Secondary | ICD-10-CM | POA: Insufficient documentation

## 2020-02-11 DIAGNOSIS — M542 Cervicalgia: Secondary | ICD-10-CM | POA: Diagnosis not present

## 2020-02-11 DIAGNOSIS — M25552 Pain in left hip: Secondary | ICD-10-CM | POA: Diagnosis not present

## 2020-02-11 DIAGNOSIS — M545 Low back pain, unspecified: Secondary | ICD-10-CM | POA: Diagnosis not present

## 2020-02-11 DIAGNOSIS — F1120 Opioid dependence, uncomplicated: Secondary | ICD-10-CM | POA: Diagnosis present

## 2020-02-11 DIAGNOSIS — Z79899 Other long term (current) drug therapy: Secondary | ICD-10-CM | POA: Insufficient documentation

## 2020-02-11 DIAGNOSIS — Z9889 Other specified postprocedural states: Secondary | ICD-10-CM | POA: Diagnosis not present

## 2020-02-11 DIAGNOSIS — M79671 Pain in right foot: Secondary | ICD-10-CM | POA: Diagnosis not present

## 2020-02-11 NOTE — Progress Notes (Signed)
Safety precautions to be maintained throughout the outpatient stay will include: orient to surroundings, keep bed in low position, maintain call bell within reach at all times, provide assistance with transfer out of bed and ambulation.   Nursing Pain Medication Assessment:  Safety precautions to be maintained throughout the outpatient stay will include: orient to surroundings, keep bed in low position, maintain call bell within reach at all times, provide assistance with transfer out of bed and ambulation.  Medication Inspection Compliance: Pill count conducted under aseptic conditions, in front of the patient. Neither the pills nor the bottle was removed from the patient's sight at any time. Once count was completed pills were immediately returned to the patient in their original bottle.  Medication: Hydrocodone/APAP Pill/Patch Count: 85 of 120 pills remain Pill/Patch Appearance: Markings consistent with prescribed medication Bottle Appearance: Standard pharmacy container. Clearly labeled. Filled Date: 1 / 6 / 2022 Last Medication intake:  Today

## 2020-02-22 ENCOUNTER — Ambulatory Visit: Payer: Self-pay | Admitting: *Deleted

## 2020-02-22 NOTE — Telephone Encounter (Signed)
Pt called with complaints of "not being able breathe"; the pt says he can barely walk 15 feet without having shortness of breath;' this started last week but has worsened; the pt says he stopped smoking 5 weeks ago; he has a productive cough clear secretions; recommendations made per nurse triage protocol; he verbalized understanding; the pt will have his sister take him to the ED; he is seen by Dr Brita Romp, [facility]; will route to office for notification.  Reason for Disposition . Sounds like a life-threatening emergency to the triager  Answer Assessment - Initial Assessment Questions 1. RESPIRATORY STATUS: "Describe your breathing?" (e.g., wheezing, shortness of breath, unable to speak, severe coughing)      shortness of breath  2. ONSET: "When did this breathing problem begin?"   02/15/19 3. PATTERN "Does the difficult breathing come and go, or has it been constant since it started?"  Worsening over the week 4. SEVERITY: "How bad is your breathing?" (e.g., mild, moderate, severe)    - MILD: No SOB at rest, mild SOB with walking, speaks normally in sentences, can lay down, no retractions, pulse < 100.    - MODERATE: SOB at rest, SOB with minimal exertion and prefers to sit, cannot lie down flat, speaks in phrases, mild retractions, audible wheezing, pulse 100-120.    - SEVERE: Very SOB at rest, speaks in single words, struggling to breathe, sitting hunched forward, retractions, pulse > 120     moderate 5. RECURRENT SYMPTOM: "Have you had difficulty breathing before?" If Yes, ask: "When was the last time?" and "What happened that time?"     Yes pt has COPD 6. CARDIAC HISTORY: "Do you have any history of heart disease?" (e.g., heart attack, angina, bypass surgery, angioplasty)      htn 7. LUNG HISTORY: "Do you have any history of lung disease?"  (e.g., pulmonary embolus, asthma, emphysema)     Copd, emohysema 8. CAUSE: "What do you think is causing the breathing problem?"      ?  Flare up. ? Black mold 9. OTHER SYMPTOMS: "Do you have any other symptoms? (e.g., dizziness, runny nose, cough, chest pain, fever)     Productive cough with clear secretions 10. PREGNANCY: "Is there any chance you are pregnant?" "When was your last menstrual period?"       n/a 11. TRAVEL: "Have you traveled out of the country in the last month?" (e.g., travel history, exposures)  Protocols used: BREATHING DIFFICULTY-A-AH

## 2020-02-22 NOTE — Telephone Encounter (Signed)
Noted  

## 2020-02-23 ENCOUNTER — Emergency Department
Admission: EM | Admit: 2020-02-23 | Discharge: 2020-02-23 | Disposition: A | Discharge status: Home / Self Care | Payer: Medicaid Other | Attending: Emergency Medicine | Admitting: Emergency Medicine

## 2020-02-23 ENCOUNTER — Emergency Department: Payer: Medicaid Other

## 2020-02-23 ENCOUNTER — Other Ambulatory Visit: Payer: Self-pay

## 2020-02-23 DIAGNOSIS — I708 Atherosclerosis of other arteries: Secondary | ICD-10-CM | POA: Diagnosis not present

## 2020-02-23 DIAGNOSIS — I2510 Atherosclerotic heart disease of native coronary artery without angina pectoris: Secondary | ICD-10-CM | POA: Diagnosis not present

## 2020-02-23 DIAGNOSIS — M5136 Other intervertebral disc degeneration, lumbar region: Secondary | ICD-10-CM | POA: Diagnosis not present

## 2020-02-23 DIAGNOSIS — Z87891 Personal history of nicotine dependence: Secondary | ICD-10-CM | POA: Insufficient documentation

## 2020-02-23 DIAGNOSIS — N200 Calculus of kidney: Secondary | ICD-10-CM | POA: Diagnosis not present

## 2020-02-23 DIAGNOSIS — J441 Chronic obstructive pulmonary disease with (acute) exacerbation: Secondary | ICD-10-CM | POA: Diagnosis not present

## 2020-02-23 DIAGNOSIS — Z7982 Long term (current) use of aspirin: Secondary | ICD-10-CM | POA: Diagnosis not present

## 2020-02-23 DIAGNOSIS — R0602 Shortness of breath: Secondary | ICD-10-CM | POA: Diagnosis not present

## 2020-02-23 DIAGNOSIS — R109 Unspecified abdominal pain: Secondary | ICD-10-CM | POA: Insufficient documentation

## 2020-02-23 DIAGNOSIS — K6389 Other specified diseases of intestine: Secondary | ICD-10-CM | POA: Diagnosis not present

## 2020-02-23 DIAGNOSIS — R059 Cough, unspecified: Secondary | ICD-10-CM | POA: Diagnosis not present

## 2020-02-23 DIAGNOSIS — I10 Essential (primary) hypertension: Secondary | ICD-10-CM | POA: Insufficient documentation

## 2020-02-23 DIAGNOSIS — Z79899 Other long term (current) drug therapy: Secondary | ICD-10-CM | POA: Insufficient documentation

## 2020-02-23 LAB — URINALYSIS, COMPLETE (UACMP) WITH MICROSCOPIC
Bacteria, UA: NONE SEEN
Bilirubin Urine: NEGATIVE
Glucose, UA: NEGATIVE mg/dL
Hgb urine dipstick: NEGATIVE
Ketones, ur: NEGATIVE mg/dL
Leukocytes,Ua: NEGATIVE
Nitrite: NEGATIVE
Protein, ur: NEGATIVE mg/dL
Specific Gravity, Urine: 1.009 (ref 1.005–1.030)
Squamous Epithelial / HPF: NONE SEEN (ref 0–5)
pH: 5 (ref 5.0–8.0)

## 2020-02-23 LAB — CBC
HCT: 39.4 % (ref 39.0–52.0)
Hemoglobin: 13.5 g/dL (ref 13.0–17.0)
MCH: 30.5 pg (ref 26.0–34.0)
MCHC: 34.3 g/dL (ref 30.0–36.0)
MCV: 88.9 fL (ref 80.0–100.0)
Platelets: 220 10*3/uL (ref 150–400)
RBC: 4.43 MIL/uL (ref 4.22–5.81)
RDW: 12.7 % (ref 11.5–15.5)
WBC: 11.6 10*3/uL — ABNORMAL HIGH (ref 4.0–10.5)
nRBC: 0 % (ref 0.0–0.2)

## 2020-02-23 LAB — BASIC METABOLIC PANEL
Anion gap: 13 (ref 5–15)
BUN: 16 mg/dL (ref 8–23)
CO2: 23 mmol/L (ref 22–32)
Calcium: 9.1 mg/dL (ref 8.9–10.3)
Chloride: 95 mmol/L — ABNORMAL LOW (ref 98–111)
Creatinine, Ser: 0.73 mg/dL (ref 0.61–1.24)
GFR, Estimated: >60 mL/min (ref >60)
Glucose, Bld: 107 mg/dL — ABNORMAL HIGH (ref 70–99)
Potassium: 3.6 mmol/L (ref 3.5–5.1)
Sodium: 131 mmol/L — ABNORMAL LOW (ref 135–145)

## 2020-02-23 LAB — TROPONIN I (HIGH SENSITIVITY): Troponin I (High Sensitivity): 11 ng/L (ref <18)

## 2020-02-23 MED ORDER — PREDNISONE 50 MG PO TABS: 50.0000 mg | ORAL_TABLET | Freq: Every day | ORAL | 0 refills | Status: DC

## 2020-02-23 MED ORDER — PREDNISONE 20 MG PO TABS
60.0000 mg | ORAL_TABLET | Freq: Once | ORAL | Status: AC
  Administered 2020-02-23: 60 mg via ORAL
  Filled 2020-02-23: qty 3

## 2020-02-23 MED ORDER — KETOROLAC TROMETHAMINE 30 MG/ML IJ SOLN
30.0000 mg | Freq: Once | INTRAMUSCULAR | Status: AC
  Administered 2020-02-23: 30 mg via INTRAMUSCULAR
  Filled 2020-02-23: qty 1

## 2020-02-23 MED ORDER — IPRATROPIUM-ALBUTEROL 0.5-2.5 (3) MG/3ML IN SOLN
3.0000 mL | Freq: Once | RESPIRATORY_TRACT | Status: AC
  Administered 2020-02-23: 3 mL via RESPIRATORY_TRACT
  Filled 2020-02-23: qty 3

## 2020-02-23 MED ORDER — ALBUTEROL SULFATE (2.5 MG/3ML) 0.083% IN NEBU
INHALATION_SOLUTION | RESPIRATORY_TRACT | Status: AC
  Filled 2020-02-23: qty 3

## 2020-02-23 MED ORDER — ALBUTEROL SULFATE HFA 108 (90 BASE) MCG/ACT IN AERS: 2.0000 | INHALATION_SPRAY | Freq: Four times a day (QID) | RESPIRATORY_TRACT | 2 refills | Status: DC | PRN
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
## 2020-02-23 NOTE — ED Notes (Signed)
Patient transported to CT 

## 2020-02-23 NOTE — ED Triage Notes (Signed)
Pt arrives to ER c/o SOB, hx COPD. A&O, ambulatory. Denies wanting wheelchair. Pt states SOB with movement. When he gets SOB he starts coughing. 93% on RA after ambulating.

## 2020-02-26 ENCOUNTER — Encounter: Payer: Self-pay | Admitting: Internal Medicine

## 2020-02-26 ENCOUNTER — Ambulatory Visit (INDEPENDENT_AMBULATORY_CARE_PROVIDER_SITE_OTHER): Payer: Medicaid Other | Admitting: Internal Medicine

## 2020-02-26 ENCOUNTER — Other Ambulatory Visit: Payer: Self-pay

## 2020-02-26 VITALS — BP 134/78 | HR 77 | Temp 97.8°F | Ht 68.0 in | Wt 193.4 lb

## 2020-02-26 DIAGNOSIS — G4733 Obstructive sleep apnea (adult) (pediatric): Secondary | ICD-10-CM | POA: Diagnosis not present

## 2020-02-26 NOTE — Patient Instructions (Signed)
Recommend assessment with sleep certified physician

## 2020-02-26 NOTE — Progress Notes (Signed)
  CC follow up OSA  HPI Patient had a follow-up CPAP titration study Patient has difficulty with BiPAP machine at night He is very concerned about untreated OSA He is very anxious  At this time patient has very complex obstructive sleep apnea He is aware of the cardiac risks associated with untreated sleep apnea I strongly recommend seeing sleep certified physician for assessment   Patient also has exposure to black mold in his housing The public housing development is making arrangements for him to move  Patient is a former smoker quit 5 weeks ago However at this time there is no shortness of breath or wheezing at this time Patient has intermittent reactive airways disease      Observations/Objective:   Saturation drop to low of 66% Total of 213 desaturation events 55 minutes below saturation of 90% 39 minutes below saturation of 88% PLMS index 50.1 512 episodes of snoring    Review of Systems:  Gen:  Denies  fever, sweats, chills weight loss  HEENT: Denies blurred vision, double vision, ear pain, eye pain, hearing loss, nose bleeds, sore throat Cardiac:  No dizziness, chest pain or heaviness, chest tightness,edema, No JVD Resp:   No cough, -sputum production, -shortness of breath,-wheezing, -hemoptysis,  Gi: Denies swallowing difficulty, stomach pain, nausea or vomiting, diarrhea, constipation, bowel incontinence Gu:  Denies bladder incontinence, burning urine Ext:   Denies Joint pain, stiffness or swelling Skin: Denies  skin rash, easy bruising or bleeding or hives Endoc:  Denies polyuria, polydipsia , polyphagia or weight change Psych:   Denies depression, insomnia or hallucinations  Other:  All other systems negative  BP 134/78 (BP Location: Left Arm, Cuff Size: Normal)   Pulse 77   Temp 97.8 F (36.6 C) (Temporal)   Ht 5\' 8"  (1.727 m)   Wt 193 lb 6.4 oz (87.7 kg)   SpO2 96%   BMI 29.41 kg/m   Physical Examination:   General Appearance: No distress   Neuro:without focal findings,  speech normal,  HEENT: PERRLA, EOM intact.   Pulmonary: normal breath sounds, No wheezing.  CardiovascularNormal S1,S2.  No m/r/g.   Abdomen: Benign, Soft, non-tender. Renal:  No costovertebral tenderness  GU:  Not performed at this time. Endoc: No evident thyromegaly Skin:   warm, no rashes, no ecchymosis  Extremities: normal, no cyanosis, clubbing. PSYCHIATRIC: Mood, affect within normal limits.   ALL OTHER ROS ARE NEGATIVE   Assessment and Plan: Very complex sleep apnea  Patient had several sleep studies including a CPAP titration study  Patient is having a very difficult time  It is hard to discern if it is a mask problem for pressure related problem  Patient will need follow-up visit with certified sleep physician    COVID-19 EDUCATION: The signs and symptoms of COVID-19 were discussed with the patient. Importance of hand Hygiene and Social Distancing   MEDICATION ADJUSTMENTS/LABS AND TESTS ORDERED: Sleep certification physician needed for follow-up With cardiology CURRENT MEDICATIONS REVIEWED AT LENGTH WITH PATIENT TODAY   Patient/Family are satisfied with Plan of action and management. All questions answered  Follow up as needed Total time spent 23 mins    Maretta Bees Patricia Pesa, M.D.  [facility]  Medical Director [place] Director [facility] Cardio-Pulmonary Department

## 2020-02-27 ENCOUNTER — Encounter: Payer: Medicaid Other | Admitting: Pain Medicine

## 2020-03-03 ENCOUNTER — Other Ambulatory Visit: Payer: Self-pay | Admitting: Family Medicine

## 2020-03-03 ENCOUNTER — Telehealth: Payer: Self-pay | Admitting: Acute Care

## 2020-03-03 DIAGNOSIS — G4733 Obstructive sleep apnea (adult) (pediatric): Secondary | ICD-10-CM | POA: Diagnosis not present

## 2020-03-03 NOTE — Telephone Encounter (Signed)
Per Dr. Zoila Shutter note needs to be seen by Sleep specialist and I spoke with Caleb [name] and he is willing to drive to [facility] to see someone sooner.  Can someone call and make him an appt in [place]

## 2020-03-03 NOTE — Telephone Encounter (Signed)
Looks like this is a [place] one Rodena Piety will you check on this one

## 2020-03-03 NOTE — Telephone Encounter (Addendum)
Called and spoke with pt. Pt has been scheduled for a new pt appt with Dr. Herminio Heads. March 2 at 9am. Pt has been made aware of [place] office address. Nothing further needed.

## 2020-03-04 NOTE — Progress Notes (Deleted)
03/05/20- 57 yoM former smoker (2.5 ppd/ 127 pk yrs) for sleep evaluation courtesy of Dr Mortimer Fries who follows for general pulmonary.  Medical problem list includes -CAD/ MI, HTN, COPD, Cervical Disc Disease, DDD, Anxiety/Depresion,   Psoriasis, Periodic Limb Movement,  NPSG 03/17/17 ([place]) AHI 43.2/ hr, desaturation to 52.6%, body weight 200 lbs Most recent BIPAP titration 04/25/19- 19/14 ( min sat 89%, AHI 1.4/ hr) Epworth score- Body weight today- Download- Covid vax- Flu vax-

## 2020-03-05 ENCOUNTER — Ambulatory Visit: Payer: Medicaid Other | Admitting: Internal Medicine

## 2020-03-07 ENCOUNTER — Encounter: Payer: Self-pay | Admitting: Family Medicine

## 2020-03-07 ENCOUNTER — Telehealth (INDEPENDENT_AMBULATORY_CARE_PROVIDER_SITE_OTHER): Payer: Medicaid Other | Admitting: Family Medicine

## 2020-03-07 ENCOUNTER — Ambulatory Visit
Admission: RE | Admit: 2020-03-07 | Discharge: 2020-03-07 | Disposition: A | Discharge status: Home / Self Care | Payer: Medicaid Other | Attending: Family Medicine | Admitting: Family Medicine

## 2020-03-07 ENCOUNTER — Ambulatory Visit
Admission: RE | Admit: 2020-03-07 | Discharge: 2020-03-07 | Disposition: A | Discharge status: Home / Self Care | Payer: Medicaid Other | Source: Ambulatory Visit | Attending: Family Medicine | Admitting: Family Medicine

## 2020-03-07 ENCOUNTER — Ambulatory Visit: Payer: Self-pay

## 2020-03-07 DIAGNOSIS — R059 Cough, unspecified: Secondary | ICD-10-CM | POA: Insufficient documentation

## 2020-03-07 DIAGNOSIS — R0602 Shortness of breath: Secondary | ICD-10-CM | POA: Insufficient documentation

## 2020-03-07 DIAGNOSIS — R6889 Other general symptoms and signs: Secondary | ICD-10-CM

## 2020-03-07 DIAGNOSIS — R509 Fever, unspecified: Secondary | ICD-10-CM

## 2020-03-07 DIAGNOSIS — J189 Pneumonia, unspecified organism: Secondary | ICD-10-CM | POA: Diagnosis not present

## 2020-03-07 MED ORDER — AZITHROMYCIN 250 MG PO TABS
ORAL_TABLET | ORAL | 0 refills | Status: DC
Start: 2020-03-07 — End: 2020-03-21

## 2020-03-07 NOTE — Telephone Encounter (Signed)
Patient set up for virtual visit today

## 2020-03-07 NOTE — Telephone Encounter (Signed)
Can he get on virtual visit with me now. Had some no shows

## 2020-03-07 NOTE — Progress Notes (Signed)
MyChart Video Visit    Virtual Visit via Video Note   This visit type was conducted due to national recommendations for restrictions regarding the COVID-19 Pandemic (e.g. social distancing) in an effort to limit this patient's exposure and mitigate transmission in our community. This patient is at least at moderate risk for complications without adequate follow up. This format is felt to be most appropriate for this patient at this time. Physical exam was limited by quality of the video and audio technology used for the visit.    Patient location: home Provider location: [place] involved in the visit: patient, provider  I discussed the limitations of evaluation and management by telemedicine and the availability of in person appointments. The patient expressed understanding and agreed to proceed.  Patient: Caleb [name]   DOB: 01-Jun-1955   65 y.o. Male  MRN: [id number] Visit Date: 03/07/2020  Today's healthcare provider: Lavon Paganini, MD   Chief Complaint  Patient presents with  . Cough   Subjective    HPI   Symptoms started 2-3 days ago. Chills, rigors, fever.  Feels SOB with talking and exertion. Some cough, but no more productive than usual. +Headache.   Home COVID test negative.  Feels   Has been off of losartan b/c pharmacy issues. Taking ibuprofen and tylenol.  Quit smoking 6wks ago, but had a cigarette after feeling this bad.  Social History   Tobacco Use  . Smoking status: Former Smoker    Packs/day: 2.50    Years: 51.00    Pack years: 127.50    Types: Cigarettes    Quit date: 01/22/2020    Years since quitting: 0.1  . Smokeless tobacco: Never Used  Vaping Use  . Vaping Use: Never used  Substance Use Topics  . Alcohol use: Yes    Alcohol/week: 4.0 standard drinks    Types: 4 Cans of beer per week    Comment: weekly  . Drug use: No      Medications: Outpatient Medications Prior to Visit  Medication Sig  .  albuterol (VENTOLIN HFA) 108 (90 Base) MCG/ACT inhaler Inhale 2 puffs into the lungs every 6 (six) hours as needed for wheezing or shortness of breath.  [name] ELLIPTA 62.5-25 MCG/INH AEPB INHALE 1 PUFF BY MOUTH EVERY DAY (Patient taking differently: Inhale 1 puff into the lungs daily.)  . aspirin EC 81 MG tablet Take 81 mg by mouth daily.   . cetirizine (ZYRTEC) 10 MG tablet Take 1 tablet (10 mg total) by mouth daily.  . fluocinolone (SYNALAR) 0.01 % external solution Apply topically 2 (two) times daily. To ears for psoriasis (Patient taking differently: Apply 1 application topically 2 (two) times daily as needed (psoriasis in ears).)  . fluticasone (FLONASE) 50 MCG/ACT nasal spray Place 2 sprays into both nostrils daily.  . hydrochlorothiazide (HYDRODIURIL) 25 MG tablet TAKE 1 TABLET BY MOUTH EVERY DAY (Patient taking differently: Take 25 mg by mouth daily.)  . HYDROcodone-acetaminophen (NORCO) 7.5-325 MG tablet Take 1 tablet by mouth every 6 (six) hours as needed for severe pain. Must last 30 days.  Marland Kitchen HYDROcodone-acetaminophen (NORCO) 7.5-325 MG tablet Take 1 tablet by mouth every 6 (six) hours as needed for severe pain. Must last 30 days.  Derrill Memo ON 04/01/2020] HYDROcodone-acetaminophen (NORCO) 7.5-325 MG tablet Take 1 tablet by mouth every 6 (six) hours as needed for severe pain. Must last 30 days.  Marland Kitchen ibuprofen (ADVIL) 800 MG tablet Take 1 tablet (800 mg total) by  mouth every 6 (six) hours as needed.  . isosorbide mononitrate (IMDUR) 30 MG 24 hr tablet TAKE 0.5 TABLETS (15 MG TOTAL) BY MOUTH DAILY.  Marland Kitchen losartan (COZAAR) 100 MG tablet Take 1 tablet (100 mg total) by mouth daily.  . Melatonin 10 MG CAPS Take 20 mg by mouth at bedtime.  . methocarbamol (ROBAXIN) 750 MG tablet Take 1 tablet (750 mg total) by mouth every 8 (eight) hours as needed for muscle spasms.  . mometasone (ELOCON) 0.1 % lotion APPLY 1 ML ONTO THE SKIN DAILY FOR PSORIASIS  . naloxone (NARCAN) nasal spray 4 mg/0.1 mL Use in  case of opioid overdose  . nitroGLYCERIN (NITROSTAT) 0.4 MG SL tablet Place 1 tablet (0.4 mg total) under the tongue every 5 (five) minutes as needed for chest pain.  . predniSONE (DELTASONE) 50 MG tablet Take 1 tablet (50 mg total) by mouth daily with breakfast.  . pregabalin (LYRICA) 150 MG capsule Take 1 capsule (150 mg total) by mouth 3 (three) times daily.  . rosuvastatin (CRESTOR) 40 MG tablet TAKE 1 TABLET BY MOUTH EVERY DAY  . tadalafil (CIALIS) 20 MG tablet Take 1 tablet (20 mg total) by mouth daily as needed for erectile dysfunction.  . TURMERIC PO Take 1 capsule by mouth daily.  . varenicline (CHANTIX) 1 MG tablet Take 1 tablet (1 mg total) by mouth 2 (two) times daily.   No facility-administered medications prior to visit.    Review of Systems - per HPI    Objective    There were no vitals taken for this visit.   Physical Exam Constitutional:      General: He is not in acute distress.    Appearance: Normal appearance. He is not diaphoretic.  HENT:     Head: Normocephalic.  Eyes:     Conjunctiva/sclera: Conjunctivae normal.  Pulmonary:     Effort: Pulmonary effort is normal. No respiratory distress.  Neurological:     Mental Status: He is alert and oriented to person, place, and time. Mental status is at baseline.        Assessment & Plan     1. Fever, unspecified fever cause 2. Rigors 3. Shortness of breath 4. Cough - given new onset fever and rigors in setting of SOB and cough, concern for possible pneumonia vs COVID19 - will get COVID PCR and CXR - start Azithromycin pending CXR results - discussed strict return/emergent precautions - DG Chest 2 View; Future - Novel Coronavirus, NAA ([facility])    Return if symptoms worsen or fail to improve.     I discussed the assessment and treatment plan with the patient. The patient was provided an opportunity to ask questions and all were answered. The patient agreed with the plan and demonstrated an  understanding of the instructions.   The patient was advised to call back or seek an in-person evaluation if the symptoms worsen or if the condition fails to improve as anticipated.  I, Lavon Paganini, MD, have reviewed all documentation for this visit. The documentation on 03/07/20 for the exam, diagnosis, procedures, and orders are all accurate and complete.   Arria Naim, Dionne Bucy, MD, MPH [facility]

## 2020-03-07 NOTE — Telephone Encounter (Signed)
  Pt. Reports he was seen in February with his COPD. Saw pulmonology this week."He's working on my sleep apnea." Reports he is still having shortness of breath, chills, bad headache. Home COVID test was negative yesterday. States he can go to UC if no availability in the practice.Spoke with Elmyra Ricks in the practice and will send triage for review. Answer Assessment - Initial Assessment Questions 1. RESPIRATORY STATUS: "Describe your breathing?" (e.g., wheezing, shortness of breath, unable to speak, severe coughing)      Shortness of breath 2. ONSET: "When did this breathing problem begin?"      Last month 3. PATTERN "Does the difficult breathing come and go, or has it been constant since it started?"      Comes and goes 4. SEVERITY: "How bad is your breathing?" (e.g., mild, moderate, severe)    - MILD: No SOB at rest, mild SOB with walking, speaks normally in sentences, can lay down, no retractions, pulse < 100.    - MODERATE: SOB at rest, SOB with minimal exertion and prefers to sit, cannot lie down flat, speaks in phrases, mild retractions, audible wheezing, pulse 100-120.    - SEVERE: Very SOB at rest, speaks in single words, struggling to breathe, sitting hunched forward, retractions, pulse > 120      Moderate 5. RECURRENT SYMPTOM: "Have you had difficulty breathing before?" If Yes, ask: "When was the last time?" and "What happened that time?"      Yes 6. CARDIAC HISTORY: "Do you have any history of heart disease?" (e.g., heart attack, angina, bypass surgery, angioplasty)      Yes 7. LUNG HISTORY: "Do you have any history of lung disease?"  (e.g., pulmonary embolus, asthma, emphysema)     COPD 8. CAUSE: "What do you think is causing the breathing problem?"      COPD 9. OTHER SYMPTOMS: "Do you have any other symptoms? (e.g., dizziness, runny nose, cough, chest pain, fever)    Chills, headache 10. PREGNANCY: "Is there any chance you are pregnant?" "When was your last menstrual period?"        n/a 11. TRAVEL: "Have you traveled out of the country in the last month?" (e.g., travel history, exposures)       No  Protocols used: BREATHING DIFFICULTY-A-AH

## 2020-03-08 LAB — SARS-COV-2, NAA 2 DAY TAT

## 2020-03-08 LAB — NOVEL CORONAVIRUS, NAA: SARS-CoV-2, NAA: NOT DETECTED

## 2020-03-09 ENCOUNTER — Other Ambulatory Visit: Payer: Self-pay | Admitting: Family Medicine

## 2020-03-09 ENCOUNTER — Other Ambulatory Visit: Payer: Self-pay | Admitting: Podiatry

## 2020-03-09 NOTE — Telephone Encounter (Signed)
Please advise 

## 2020-03-12 ENCOUNTER — Other Ambulatory Visit: Payer: Self-pay | Admitting: Family Medicine

## 2020-03-20 ENCOUNTER — Encounter: Payer: Self-pay | Admitting: Family Medicine

## 2020-03-20 ENCOUNTER — Other Ambulatory Visit: Payer: Self-pay | Admitting: Student in an Organized Health Care Education/Training Program

## 2020-03-20 DIAGNOSIS — G894 Chronic pain syndrome: Secondary | ICD-10-CM

## 2020-03-20 DIAGNOSIS — M792 Neuralgia and neuritis, unspecified: Secondary | ICD-10-CM

## 2020-03-20 DIAGNOSIS — S8411XS Injury of peroneal nerve at lower leg level, right leg, sequela: Secondary | ICD-10-CM

## 2020-03-21 ENCOUNTER — Ambulatory Visit (INDEPENDENT_AMBULATORY_CARE_PROVIDER_SITE_OTHER): Payer: Medicaid Other | Admitting: Family Medicine

## 2020-03-21 ENCOUNTER — Other Ambulatory Visit: Payer: Self-pay

## 2020-03-21 VITALS — BP 131/86 | HR 84 | Temp 97.7°F | Wt 192.0 lb

## 2020-03-21 DIAGNOSIS — E7800 Pure hypercholesterolemia, unspecified: Secondary | ICD-10-CM

## 2020-03-21 DIAGNOSIS — I10 Essential (primary) hypertension: Secondary | ICD-10-CM

## 2020-03-21 DIAGNOSIS — J189 Pneumonia, unspecified organism: Secondary | ICD-10-CM

## 2020-03-21 DIAGNOSIS — Z0000 Encounter for general adult medical examination without abnormal findings: Secondary | ICD-10-CM

## 2020-03-21 DIAGNOSIS — Z125 Encounter for screening for malignant neoplasm of prostate: Secondary | ICD-10-CM | POA: Diagnosis not present

## 2020-03-21 DIAGNOSIS — Z720 Tobacco use: Secondary | ICD-10-CM | POA: Diagnosis not present

## 2020-03-21 DIAGNOSIS — R7303 Prediabetes: Secondary | ICD-10-CM

## 2020-03-21 HISTORY — DX: Pneumonia, unspecified organism: J18.9

## 2020-03-21 MED ORDER — LOSARTAN POTASSIUM 100 MG PO TABS: 100.0000 mg | ORAL_TABLET | Freq: Every day | ORAL | 1 refills | Status: DC

## 2020-03-21 MED ORDER — DOXYCYCLINE HYCLATE 100 MG PO TABS: 100.0000 mg | ORAL_TABLET | Freq: Two times a day (BID) | ORAL | 0 refills | Status: AC

## 2020-03-21 NOTE — Assessment & Plan Note (Signed)
Patient had quit smoking for about 6 weeks, but recently resumed Encouraged cessation again

## 2020-03-21 NOTE — Assessment & Plan Note (Signed)
Recommend low carb diet °Recheck A1c  °

## 2020-03-21 NOTE — Assessment & Plan Note (Signed)
Previously well controlled Continue statin Repeat FLP and CMP Goal LDL < 70 

## 2020-03-21 NOTE — Assessment & Plan Note (Signed)
Well controlled Continue current medications Reviewed recent metabolic panel F/u in 6 months  

## 2020-03-21 NOTE — Assessment & Plan Note (Signed)
Reviewed recent x-ray Continues to have crackles in right middle lobe Improved somewhat after Z-Pak, but not entirely 70 course of doxycycline Return precautions discussed

## 2020-03-21 NOTE — Patient Instructions (Signed)
Preventive Care 40-64 Years Old, Male Preventive care refers to lifestyle choices and visits with your health care provider that can promote health and wellness. This includes:  A yearly physical exam. This is also called an annual wellness visit.  Regular dental and eye exams.  Immunizations.  Screening for certain conditions.  Healthy lifestyle choices, such as: ? Eating a healthy diet. ? Getting regular exercise. ? Not using drugs or products that contain nicotine and tobacco. ? Limiting alcohol use. What can I expect for my preventive care visit? Physical exam Your health care provider will check your:  Height and weight. These may be used to calculate your BMI (body mass index). BMI is a measurement that tells if you are at a healthy weight.  Heart rate and blood pressure.  Body temperature.  Skin for abnormal spots. Counseling Your health care provider may ask you questions about your:  Past medical problems.  Family's medical history.  Alcohol, tobacco, and drug use.  Emotional well-being.  Home life and relationship well-being.  Sexual activity.  Diet, exercise, and sleep habits.  Work and work environment.  Access to firearms. What immunizations do I need? Vaccines are usually given at various ages, according to a schedule. Your health care provider will recommend vaccines for you based on your age, medical history, and lifestyle or other factors, such as travel or where you work.   What tests do I need? Blood tests  Lipid and cholesterol levels. These may be checked every 5 years, or more often if you are over 50 years old.  Hepatitis C test.  Hepatitis B test. Screening  Lung cancer screening. You may have this screening every year starting at age 55 if you have a 30-pack-year history of smoking and currently smoke or have quit within the past 15 years.  Prostate cancer screening. Recommendations will vary depending on your family history and  other risks.  Genital exam to check for testicular cancer or hernias.  Colorectal cancer screening. ? All adults should have this screening starting at age 50 and continuing until age 75. ? Your health care provider may recommend screening at age 45 if you are at increased risk. ? You will have tests every 1-10 years, depending on your results and the type of screening test.  Diabetes screening. ? This is done by checking your blood sugar (glucose) after you have not eaten for a while (fasting). ? You may have this done every 1-3 years.  STD (sexually transmitted disease) testing, if you are at risk. Follow these instructions at home: Eating and drinking  Eat a diet that includes fresh fruits and vegetables, whole grains, lean protein, and low-fat dairy products.  Take vitamin and mineral supplements as recommended by your health care provider.  Do not drink alcohol if your health care provider tells you not to drink.  If you drink alcohol: ? Limit how much you have to 0-2 drinks a day. ? Be aware of how much alcohol is in your drink. In the [place] one drink equals one 12 oz bottle of beer (355 mL), one 5 oz glass of wine (148 mL), or one 1 oz glass of hard liquor (44 mL).   Lifestyle  Take daily care of your teeth and gums. Brush your teeth every morning and night with fluoride toothpaste. Floss one time each day.  Stay active. Exercise for at least 30 minutes 5 or more days each week.  Do not use any products that contain nicotine or   tobacco, such as cigarettes, e-cigarettes, and chewing tobacco. If you need help quitting, ask your health care provider.  Do not use drugs.  If you are sexually active, practice safe sex. Use a condom or other form of protection to prevent STIs (sexually transmitted infections).  If told by your health care provider, take low-dose aspirin daily starting at age 50.  Find healthy ways to cope with stress, such as: ? Meditation, yoga, or  listening to music. ? Journaling. ? Talking to a trusted person. ? Spending time with friends and family. Safety  Always wear your seat belt while driving or riding in a vehicle.  Do not drive: ? If you have been drinking alcohol. Do not ride with someone who has been drinking. ? When you are tired or distracted. ? While texting.  Wear a helmet and other protective equipment during sports activities.  If you have firearms in your house, make sure you follow all gun safety procedures. What's next?  Go to your health care provider once a year for an annual wellness visit.  Ask your health care provider how often you should have your eyes and teeth checked.  Stay up to date on all vaccines. This information is not intended to replace advice given to you by your health care provider. Make sure you discuss any questions you have with your health care provider. Document Revised: 09/19/2018 Document Reviewed: 12/15/2017 [employer]vier Patient Education  2021 [employer]  
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
## 2020-03-22 LAB — HEMOGLOBIN A1C
Est. average glucose Bld gHb Est-mCnc: 134 mg/dL
Hgb A1c MFr Bld: 6.3 % — ABNORMAL HIGH (ref 4.8–5.6)

## 2020-03-22 LAB — PSA: Prostate Specific Ag, Serum: 0.6 ng/mL (ref 0.0–4.0)

## 2020-03-24 ENCOUNTER — Other Ambulatory Visit: Payer: Self-pay | Admitting: Family Medicine

## 2020-03-24 ENCOUNTER — Telehealth: Payer: Self-pay

## 2020-03-24 DIAGNOSIS — M792 Neuralgia and neuritis, unspecified: Secondary | ICD-10-CM

## 2020-03-24 DIAGNOSIS — S8411XS Injury of peroneal nerve at lower leg level, right leg, sequela: Secondary | ICD-10-CM

## 2020-03-24 DIAGNOSIS — G894 Chronic pain syndrome: Secondary | ICD-10-CM

## 2020-03-24 MED ORDER — PREGABALIN 150 MG PO CAPS
150.0000 mg | ORAL_CAPSULE | Freq: Three times a day (TID) | ORAL | 2 refills | Status: DC
Start: 2020-03-24 — End: 2020-06-24

## 2020-03-24 NOTE — Telephone Encounter (Signed)
Patient was advised and also stated that he reviewed labs via [place].

## 2020-03-24 NOTE — Telephone Encounter (Signed)
Medication Refill - Medication: pregabalin (LYRICA) 150 MG capsule  Pt stated his Dr Dossie Arbour has been turning all medication management over to PCP, other than pain medications. See TE before this one   Has the patient contacted their pharmacy? Yes.   (Agent: If no, request that the patient contact the pharmacy for the refill.) (Agent: If yes, when and what did the pharmacy advise?)call pcp  Preferred Pharmacy (with phone number or street name): [id number] - [place], [place]  [address], [place] [address]  Phone:  [phone] Fax:  [phone]   Agent: Please be advised that RX refills may take up to 3 business days. We ask that you follow-up with your pharmacy.

## 2020-03-24 NOTE — Telephone Encounter (Signed)
Pt states the Pharmacist [facility] [address] to get his Lyrica Medication filled

## 2020-03-24 NOTE — Telephone Encounter (Signed)
-----   Message from Virginia Crews, MD sent at 03/24/2020  9:05 AM EDT ----- Prostate cancer screening lab is normal.  A1c is still in prediabetic range, but worsening.  Recommend low carb diet to avoid progression to diabetes.

## 2020-03-24 NOTE — Telephone Encounter (Signed)
Called Caleb [name] to verify what he is needing.  He states pharmacy called him and said they were sending in a medication request last week.  Today they called and stated he needs PA.  Called pharmacy and his out of Lyrica refills.  I had told [name] when I talked to him that I thought he should be out and if that is the case we will need to put him on for an appt to get Lyrica refills.  Patient is agreeable and I told him I would let him know.    I called patient to let him know that Dr Dossie Arbour has been turning all medication management over to PCP, other than pain medications.  [name] states he will call PCP and if he has any issues he will let me know.

## 2020-03-25 ENCOUNTER — Other Ambulatory Visit: Payer: Self-pay

## 2020-03-25 ENCOUNTER — Ambulatory Visit: Payer: Medicaid Other | Admitting: Pulmonary Disease

## 2020-03-25 ENCOUNTER — Encounter: Payer: Self-pay | Admitting: Pulmonary Disease

## 2020-03-25 VITALS — BP 122/72 | HR 85 | Temp 98.2°F | Ht 68.0 in | Wt 190.0 lb

## 2020-03-25 DIAGNOSIS — G4733 Obstructive sleep apnea (adult) (pediatric): Secondary | ICD-10-CM
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
## 2020-03-25 NOTE — Patient Instructions (Signed)
I will see you back in 4 weeks  We will send a prescription to [facility] for pressure change  Change BiPAP pressures to maximum IPAP of 16 Minimum EPAP of 8 Pressure support of 4  Use a full facemask  I will request that you use it regularly for the next month to get [place] more information about what is making the device intolerable for you  Look for information about inspire device-this is the stimulator that can be placed for sleep apnea that we cannot treat otherwise  Encourage you about your quitting smoking efforts

## 2020-04-24 ENCOUNTER — Other Ambulatory Visit: Payer: Self-pay | Admitting: Family Medicine

## 2020-04-24 ENCOUNTER — Other Ambulatory Visit: Payer: Self-pay | Admitting: Cardiovascular Disease

## 2020-04-24 ENCOUNTER — Other Ambulatory Visit: Payer: Self-pay | Admitting: Internal Medicine

## 2020-04-24 NOTE — Telephone Encounter (Signed)
Rx request sent to pharmacy.  

## 2020-04-28 ENCOUNTER — Other Ambulatory Visit: Payer: Self-pay

## 2020-04-28 ENCOUNTER — Encounter: Payer: Self-pay | Admitting: Pulmonary Disease

## 2020-04-28 ENCOUNTER — Ambulatory Visit: Payer: Medicaid Other | Admitting: Pulmonary Disease

## 2020-04-28 VITALS — BP 120/74 | HR 78 | Temp 97.8°F | Ht 68.0 in | Wt 193.0 lb

## 2020-04-28 DIAGNOSIS — G4733 Obstructive sleep apnea (adult) (pediatric): Secondary | ICD-10-CM | POA: Diagnosis not present

## 2020-04-28 MED ORDER — ZOLPIDEM TARTRATE 10 MG PO TABS: 10.0000 mg | ORAL_TABLET | Freq: Every day | ORAL | 0 refills | Status: DC

## 2020-04-28 MED ORDER — ZOLPIDEM TARTRATE 10 MG PO TABS: 10.0000 mg | ORAL_TABLET | Freq: Every day | ORAL | 1 refills | Status: DC

## 2020-04-28 NOTE — Patient Instructions (Signed)
Change BiPAP pressures from maximum IPAP 16-14, maintain a minimum EPAP of 8, pressure support of 4  Prescription for Ambien  I will see you back in about 6 weeks  Call with significant concerns
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
## 2020-04-29 NOTE — Progress Notes (Signed)
PROVIDER NOTE: Information contained herein reflects review and annotations entered in association with encounter. Interpretation of such information and data should be left to medically-trained personnel. Information provided to patient can be located elsewhere in the medical record under "Patient Instructions". Document created using STT-dictation technology, any transcriptional errors that may result from process are unintentional.    Patient: Caleb [name]  Service Category: E/M  Provider: Gaspar Cola, MD  DOB: 04-Jul-1955  DOS: 04/30/2020  Specialty: Interventional Pain Management  MRN: [id number]  Setting: Ambulatory outpatient  PCP: Caleb Crews, MD  Type: Established Patient    Referring Provider: Virginia Crews, MD  Location: Office  Delivery: Face-to-face     HPI  [name], a 65 y.o. year old male, is here today because of his Chronic pain syndrome [G89.4]. [name] primary complain today is Other and Neck Pain (Worse on the left) Last encounter: My last encounter with him was on 02/11/2020. Pertinent problems: Mr. 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); Cervicalgia; DDD (degenerative disc disease), thoracic; Disorder of superficial peroneal nerve (Right); Chronic upper extremity pain (Left); Cervical spondylosis w/ radiculopathy; Cervical disc disorder with radiculopathy of cervical region; Chronic upper extremity weakness (Left); Abnormal nerve conduction studies (06/08/2017); 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); Spondylosis without myelopathy or radiculopathy,  cervical region; Cervical facet syndrome (Bilateral); Abnormal MRI, cervical spine (10/02/2019 & 08/13/2017); History of fusion of cervical spine; Chronic neck pain (4th area of Pain) (Bilateral) (L>R); Chronic neck pain with history of cervical spinal surgery; LLQ abdominal pain; Neuropathic pain; Nondisplaced fracture of proximal phalanx of left thumb with routine healing; 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 Pain Assessment: Severity of Chronic pain is reported as a 6 /10. Location: Neck Right,Left/left shoulder. Onset: More than a month ago. Quality: Aching,Burning,Constant. Timing: Constant. Modifying factor(s): medications and immobility. Vitals:  height is $RemoveB'5\' 8"'crcRXjjy$  (1.727 m) and weight is 193 lb (87.5 kg). His temporal temperature is 97.2 F (36.2 C) (abnormal). His blood pressure is 125/70 and his pulse is 66. His respiration is 18 and oxygen saturation is 95%.   Reason for encounter: medication management.   The patient indicates doing well with the current medication regimen. No adverse reactions or side effects reported to the medications.   The patient indicates today having a flareup of his neck and left upper extremity pain, primarily in the area of the left shoulder and between the shoulder blades suggesting a C5 dermatomal distribution.  However, he denies any pain or numbness down into the left upper extremity.  Today I asked him if he had had  the cervical surgery but he indicated that he was seen by Dr. Cari [name] who sent him to have some physical therapy for the neck problems.  He has now completed the physical therapy but  he still having issues with the cervical region.  Today I will send another referral to Dr. Cari [name] for evaluation as it seems that he needs follow-up.  To help him with the pain, we will go ahead and schedule him for a palliative left-sided cervical epidural steroid injection under fluoroscopic guidance.  The patient has requested to have it done with sedation.  RTCB: 07/30/2020 Nonopioids transfer 11/21/2019: Lyrica and Robaxin  Pharmacotherapy Assessment   Analgesic: Hydrocodone/APAP 5/325 1 tablet every 6 hours (20 mg/day of hydrocodone).  (Unable to tolerate an oxycodone IR trial due to stomachaches, nausea, headaches, and excessive somnolence.  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   Monitoring: Caleb [name] PMP: PDMP reviewed during this encounter.       Pharmacotherapy: No side-effects or adverse reactions reported. Compliance: No problems identified. Effectiveness: Clinically acceptable.  Caleb Rochester, RN  04/30/2020 10:04 AM  Sign when Signing Visit Nursing Pain Medication Assessment:  Safety precautions to be maintained throughout the outpatient stay will include: orient to surroundings, keep bed in low position, maintain call bell within reach at all times, provide assistance with transfer out of bed and ambulation.  Medication Inspection Compliance: Pill count conducted under aseptic conditions, in front of the patient. Neither the pills nor the bottle was removed from the patient's sight at any time. Once count was completed pills were immediately returned to the patient in their original bottle.  Medication: Hydrocodone/APAP Pill/Patch Count: 10 of 120 pills remain Pill/Patch Appearance: Markings consistent with prescribed medication Bottle Appearance:  Standard pharmacy container. Clearly labeled. Filled Date: 03 / 29 / 2022 Last Medication intake:  Today    UDS:  Summary  Date Value Ref Range Status  09/11/2019 Note  Final    Comment:    ==================================================================== [employer] [facility] (MW) ==================================================================== Test                             Result       Flag       Units  Drug Present and Declared for Prescription Verification   Hydrocodone                    1156         EXPECTED   ng/mg creat   Hydromorphone                  500          EXPECTED   ng/mg creat   Dihydrocodeine                 90           EXPECTED   ng/mg creat   Norhydrocodone                 1409         EXPECTED   ng/mg creat    Sources of hydrocodone include scheduled prescription medications.    Hydromorphone, dihydrocodeine and norhydrocodone are expected    metabolites of hydrocodone. Hydromorphone and dihydrocodeine are    also available as scheduled prescription medications.  Drug Present not Declared for Prescription Verification   Tramadol                       370          UNEXPECTED ng/mg creat   O-Desmethyltramadol  545          UNEXPECTED ng/mg creat   N-Desmethyltramadol            48           UNEXPECTED ng/mg creat    Source of tramadol is a prescription medication. O-desmethyltramadol    and N-desmethyltramadol are expected metabolites of tramadol.  ==================================================================== Test                      Result    Flag   Units      Ref Range   Creatinine              176              mg/dL      >=20 ==================================================================== Declared Medications:  The flagging and interpretation on this report are based on the  following declared medications.  Unexpected results may arise from  inaccuracies in the declared medications.   **Note: The testing scope of this  panel includes these medications:   Hydrocodone (Norco)   **Note: The testing scope of this panel does not include the  following reported medications:   Acetaminophen (Norco)  Aspirin  Cetirizine (Zyrtec)  Doxycycline  Fluticasone (Flonase)  Hydrochlorothiazide  Hydrocortisone  Isosorbide (Imdur)  Losartan (Cozaar)  Methocarbamol (Robaxin)  Mometasone  Naloxone (Narcan)  Neomycin  Nitroglycerin (Nitrostat)  Polymyxin B (Polymyxin)  Pregabalin (Lyrica)  Rosuvastatin (Crestor)  Supplement  Tadalafil (Cialis)  Topical  Topical Lidocaine (Lidoderm)  Umeclidinium (Anoro)  Vilanterol (Anoro) ==================================================================== For clinical consultation, please call [phone]. ====================================================================      ROS  Constitutional: Denies any fever or chills Gastrointestinal: No reported hemesis, hematochezia, vomiting, or acute GI distress Musculoskeletal: Denies any acute onset joint swelling, redness, loss of ROM, or weakness Neurological: No reported episodes of acute onset apraxia, aphasia, dysarthria, agnosia, amnesia, paralysis, loss of coordination, or loss of consciousness  Medication Review  HYDROcodone-acetaminophen, Melatonin, Turmeric, albuterol, aspirin EC, cetirizine, fluocinolone, fluticasone, hydrochlorothiazide, ibuprofen, isosorbide mononitrate, losartan, methocarbamol, mometasone, naloxone, nitroGLYCERIN, pregabalin, rosuvastatin, tadalafil, umeclidinium-vilanterol, varenicline, and zolpidem  History Review  Allergy: [name] is allergic to lisinopril and oxycodone. Drug: [name]  reports no history of drug use. Alcohol:  reports current alcohol use of about 4.0 standard drinks of alcohol per week. Tobacco:  reports that he has been smoking cigarettes. He has a 127.50 pack-year smoking history. He has never used smokeless tobacco. Social: [name]  reports that he has  been smoking cigarettes. He has a 127.50 pack-year smoking history. He has never used smokeless tobacco. He reports current alcohol use of about 4.0 standard drinks of alcohol per week. He reports that he does not use drugs. Medical:  has a past medical history of Chronic foot pain, right (2015), COPD (chronic obstructive pulmonary disease) ([place]), Coronary artery disease, Cough syncope, Emphysema lung ([place]), Family history of adverse reaction to anesthesia, History of kidney stones, Hyperlipidemia, Hypertension, Kidney stone, Leucocytosis, Myocardial infarction ([place]) (2015), and OSA on CPAP. Surgical: [name]  has a past surgical history that includes Lumbar laminectomy (1989, 1999); Cervical fusion (1988, 1998); Liver surgery (2015); Partial colectomy (1990); Inguinal hernia repair (Bilateral, 1975); Lithotripsy; Colonoscopy with propofol (N/A, 02/24/2017); Cardiac catheterization (2015); Arthrodesis metatarsal (Right, 11/26/2019); and Lesion removal (Right, 11/26/2019). Family: family history includes Alzheimer's disease in his paternal grandmother; Alzheimer's disease (age of onset: 43) in his father; Breast cancer in his maternal uncle; CAD in his mother; Dementia in his father;  Diabetes in his maternal grandmother; Healthy in his sister; Heart attack in his maternal uncle; Heart failure in his maternal grandmother; Heart failure (age of onset: 33) in his mother; Non-Hodgkin's lymphoma in his sister.  Laboratory Chemistry Profile   Renal Lab Results  Component Value Date   BUN 16 02/23/2020   CREATININE 0.73 02/23/2020   BCR 18 09/20/2019   GFRAA 112 09/20/2019   GFRNONAA >60 02/23/2020     Hepatic Lab Results  Component Value Date   AST 15 09/20/2019   ALT 14 09/20/2019   ALBUMIN 4.1 09/20/2019   ALKPHOS 67 09/20/2019     Electrolytes Lab Results  Component Value Date   NA 131 (L) 02/23/2020   K 3.6 02/23/2020   CL 95 (L) 02/23/2020   CALCIUM 9.1 02/23/2020   MG 2.0 05/30/2018      Bone Lab Results  Component Value Date   25OHVITD1 32 02/17/2017   25OHVITD2 <1.0 02/17/2017   25OHVITD3 32 02/17/2017   TESTOFREE 5.2 (L) 02/28/2017   TESTOSTERONE 279 02/28/2017     Inflammation (CRP: Acute Phase) (ESR: Chronic Phase) Lab Results  Component Value Date   CRP 8 11/07/2017   ESRSEDRATE 26 11/07/2017       Note: Above Lab results reviewed.  Recent Imaging Review  DG Chest 2 View CLINICAL DATA:  Cough, fever, shortness of breath  EXAM: CHEST - 2 VIEW  COMPARISON:  02/23/2020  FINDINGS: Cardiomediastinal silhouette and pulmonary vasculature are within normal limits.  Lungs are hyperexpanded. Focal airspace opacity in the right mid lung is new since prior examination and is suspicious for pneumonia.  IMPRESSION: Focal airspace opacity in the right mid lung is suspicious for pneumonia.  Electronically Signed   By: Miachel Roux M.D.   On: 03/08/2020 11:43 Note: Reviewed        Physical Exam  General appearance: Well nourished, well developed, and well hydrated. In no apparent acute distress Mental status: Alert, oriented x 3 (person, place, & time)       Respiratory: No evidence of acute respiratory distress Eyes: PERLA Vitals: BP 125/70   Pulse 66   Temp (!) 97.2 F (36.2 C) (Temporal)   Resp 18   Ht $R'5\' 8"'Yb$  (1.727 m)   Wt 193 lb (87.5 kg)   SpO2 95%   BMI 29.35 kg/m  BMI: Estimated body mass index is 29.35 kg/m as calculated from the following:   Height as of this encounter: $RemoveBeforeD'5\' 8"'YeHLkHeNKIRNBc$  (1.727 m).   Weight as of this encounter: 193 lb (87.5 kg). Ideal: Ideal body weight: 68.4 kg (150 lb 12.7 oz) Adjusted ideal body weight: 76.1 kg (167 lb 10.8 oz)  Assessment   Status Diagnosis  Controlled Controlled Controlled 1. Chronic pain syndrome   2. Chronic low back pain (Bilateral) w/o sciatica   3. Lumbar facet syndrome (Bilateral)   4. Chronic sacroiliac joint pain (Bilateral)   5. Chronic hip pain (Bilateral)   6. Chronic ankle pain  (2ry area of Pain) (Right)   7. Chronic foot pain (1ry area of Pain) (Right)   8. Chronic neck pain with history of cervical spinal surgery   9. Pharmacologic therapy   10. Chronic use of opiate for therapeutic purpose   11. Uncomplicated opioid dependence ([place])   12. Cervical disc disorder with radiculopathy of cervical region   13. 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 Cervical spondylosis w/ radiculopathy   15. Cervicalgia      Updated Problems:  Problem  Chronic Use of Opiate for Therapeutic Purpose    Plan of Care  Problem-specific:  No problem-specific Assessment & Plan notes found for this encounter.  [name] has a current medication list which includes the following long-term medication(s): albuterol, cetirizine, fluticasone, hydrochlorothiazide, [START ON 05/01/2020] hydrocodone-acetaminophen, [START ON 05/31/2020] hydrocodone-acetaminophen, [START ON 06/30/2020] hydrocodone-acetaminophen, isosorbide mononitrate, losartan, nitroglycerin, pregabalin, rosuvastatin, tadalafil, zolpidem, and methocarbamol.  Pharmacotherapy (Medications Ordered): Meds ordered this encounter  Medications  . HYDROcodone-acetaminophen (NORCO) 7.5-325 MG tablet    Sig: Take 1 tablet by mouth every 6 (six) hours as needed for severe pain. Must last 30 days.    Dispense:  120 tablet    Refill:  0    Not a duplicate. Do NOT delete! Dispense 1 day early if closed on refill date. Avoid benzodiazepines within 8 hours of opioids. Do not send refill requests.  Marland Kitchen HYDROcodone-acetaminophen (NORCO) 7.5-325 MG tablet    Sig: Take 1 tablet by mouth every 6 (six) hours as needed for severe pain. Must last 30 days.    Dispense:  120 tablet    Refill:  0    Not a duplicate. Do NOT delete! Dispense 1 day early if closed on refill date. Avoid benzodiazepines within 8 hours of opioids. Do not send refill requests.  Marland Kitchen HYDROcodone-acetaminophen (NORCO) 7.5-325 MG tablet    Sig:  Take 1 tablet by mouth every 6 (six) hours as needed for severe pain. Must last 30 days.    Dispense:  120 tablet    Refill:  0    Not a duplicate. Do NOT delete! Dispense 1 day early if closed on refill date. Avoid benzodiazepines within 8 hours of opioids. Do not send refill requests.   Orders:  Orders Placed This Encounter  Procedures  . Cervical Epidural Injection    Level(s): C7-T1 Laterality: Left-sided Purpose: Palliative Indication(s): Radiculitis and cervicalgia associater with cervical degenerative disc disease.    Standing Status:   Future    Standing Expiration Date:   05/30/2020    Scheduling Instructions:     Procedure: Cervical Epidural Steroid Injection/Block     Sedation: With Sedation.     Timeframe: As soon as schedule allows    Order Specific Question:   Where will this procedure be performed?    Answer:   [facility]    Comments:   by Dr. Dossie Arbour  . Ambulatory referral to Neurosurgery    Referral Priority:   Routine    Referral Type:   Surgical    Referral Reason:   Specialty Services Required    Referred to Provider:   Meade Maw, MD    Requested Specialty:   Neurosurgery    Number of Visits Requested:   1   Follow-up plan:   Return for Procedure (w/ sedation): (L) CESI.      Interventional Therapies  Risk  Complexity Considerations:   WNL   Planned  Pending:   Diagnostic bilateral lumbar facet MBB #1    Under consideration:   Possible spinal cord stimulator trial  Possible bilateral cervical facet RFA   Completed:   Therapeutic left CESI x6(07/03/2019)  Diagnostic bilateral cervical facet blockx2(09/22/2017)  Diagnostic rightCommon Peroneal NB (C-PNB)x2(04/14/2017)  Therapeutic right common peroneal nerve (C-PN) RFA x1(06/21/2017) Diagnostic right superficial peroneal NB (S-PNB) x4 (09/13/2019)  Therapeutic right superficial peroneal nerve (S-PN) RFA x1 (10/25/2017)  Therapeutic right deep peroneal NB (D-PNB) x1  (05/03/2019)  Diagnostic left SI joint block x1 (06/19/2019)  Therapeutic midline  serratus posterior TPI/MNB x1 (01/11/2019)    Therapeutic  Palliative (PRN) options:   Palliative left CESI #7 Diagnostic bilateral cervical facet block#3 Palliative right superficial peroneal NB #5  Palliative right superficial peroneal nerve RFA #2      Recent Visits Date Type Provider Dept  02/11/20 Office Visit Milinda Pointer, MD [facility]  Showing recent visits within past 90 days and meeting all other requirements Today's Visits Date Type Provider Dept  04/30/20 Office Visit Milinda Pointer, MD [facility]  Showing today's visits and meeting all other requirements Future Appointments No visits were found meeting these conditions. Showing future appointments within next 90 days and meeting all other requirements  I discussed the assessment and treatment plan with the patient. The patient was provided an opportunity to ask questions and all were answered. The patient agreed with the plan and demonstrated an understanding of the instructions.  Patient advised to call back or seek an in-person evaluation if the symptoms or condition worsens.  Duration of encounter: 30 minutes.  Note by: Caleb Cola, MD Date: 04/30/2020; Time: 11:30 AM

## 2020-04-30 ENCOUNTER — Encounter: Payer: Self-pay | Admitting: Pain Medicine

## 2020-04-30 ENCOUNTER — Other Ambulatory Visit: Payer: Self-pay

## 2020-04-30 ENCOUNTER — Ambulatory Visit: Payer: Medicaid Other | Attending: Pain Medicine | Admitting: Pain Medicine

## 2020-04-30 VITALS — BP 125/70 | HR 66 | Temp 97.2°F | Resp 18 | Ht 68.0 in | Wt 193.0 lb

## 2020-04-30 DIAGNOSIS — G8928 Other chronic postprocedural pain: Secondary | ICD-10-CM | POA: Diagnosis not present

## 2020-04-30 DIAGNOSIS — M533 Sacrococcygeal disorders, not elsewhere classified: Secondary | ICD-10-CM | POA: Insufficient documentation

## 2020-04-30 DIAGNOSIS — Z9889 Other specified postprocedural states: Secondary | ICD-10-CM | POA: Diagnosis not present

## 2020-04-30 DIAGNOSIS — M5010 Cervical disc disorder with radiculopathy, unspecified cervical region: Secondary | ICD-10-CM | POA: Insufficient documentation

## 2020-04-30 DIAGNOSIS — M4802 Spinal stenosis, cervical region: Secondary | ICD-10-CM | POA: Insufficient documentation

## 2020-04-30 DIAGNOSIS — M47816 Spondylosis without myelopathy or radiculopathy, lumbar region: Secondary | ICD-10-CM | POA: Insufficient documentation

## 2020-04-30 DIAGNOSIS — Z79891 Long term (current) use of opiate analgesic: Secondary | ICD-10-CM | POA: Insufficient documentation

## 2020-04-30 DIAGNOSIS — F1120 Opioid dependence, uncomplicated: Secondary | ICD-10-CM | POA: Diagnosis present

## 2020-04-30 DIAGNOSIS — G894 Chronic pain syndrome: Secondary | ICD-10-CM | POA: Diagnosis not present

## 2020-04-30 DIAGNOSIS — M79671 Pain in right foot: Secondary | ICD-10-CM | POA: Diagnosis not present

## 2020-04-30 DIAGNOSIS — G8929 Other chronic pain: Secondary | ICD-10-CM | POA: Insufficient documentation

## 2020-04-30 DIAGNOSIS — M25552 Pain in left hip: Secondary | ICD-10-CM | POA: Diagnosis not present

## 2020-04-30 DIAGNOSIS — M542 Cervicalgia: Secondary | ICD-10-CM | POA: Insufficient documentation

## 2020-04-30 DIAGNOSIS — M545 Low back pain, unspecified: Secondary | ICD-10-CM | POA: Diagnosis not present

## 2020-04-30 DIAGNOSIS — Z79899 Other long term (current) drug therapy: Secondary | ICD-10-CM | POA: Insufficient documentation

## 2020-04-30 DIAGNOSIS — M25551 Pain in right hip: Secondary | ICD-10-CM | POA: Diagnosis not present

## 2020-04-30 DIAGNOSIS — M4722 Other spondylosis with radiculopathy, cervical region: Secondary | ICD-10-CM | POA: Insufficient documentation

## 2020-04-30 DIAGNOSIS — M25571 Pain in right ankle and joints of right foot: Secondary | ICD-10-CM | POA: Diagnosis not present

## 2020-04-30 MED ORDER — HYDROCODONE-ACETAMINOPHEN 7.5-325 MG PO TABS: 1.0000 | ORAL_TABLET | Freq: Four times a day (QID) | ORAL | 0 refills | Status: DC | PRN

## 2020-04-30 NOTE — Progress Notes (Signed)
Nursing Pain Medication Assessment:  Safety precautions to be maintained throughout the outpatient stay will include: orient to surroundings, keep bed in low position, maintain call bell within reach at all times, provide assistance with transfer out of bed and ambulation.  Medication Inspection Compliance: Pill count conducted under aseptic conditions, in front of the patient. Neither the pills nor the bottle was removed from the patient's sight at any time. Once count was completed pills were immediately returned to the patient in their original bottle.  Medication: Hydrocodone/APAP Pill/Patch Count: 10 of 120 pills remain Pill/Patch Appearance: Markings consistent with prescribed medication Bottle Appearance: Standard pharmacy container. Clearly labeled. Filled Date: 03 / 29 / 2022 Last Medication intake:  Today

## 2020-04-30 NOTE — Patient Instructions (Addendum)
____________________________________________________________________________________________  Preparing for Procedure with Sedation  Procedure appointments are limited to planned procedures: . No Prescription Refills. . No disability issues will be discussed. . No medication changes will be discussed.  Instructions: . Oral Intake: Do not eat or drink anything for at least 8 hours prior to your procedure. (Exception: Blood Pressure Medication. See below.) . Transportation: Unless otherwise stated by your physician, you may drive yourself after the procedure. . Blood Pressure Medicine: Do not forget to take your blood pressure medicine with a sip of water the morning of the procedure. If your Diastolic (lower reading)is above 100 mmHg, elective cases will be cancelled/rescheduled. . Blood thinners: These will need to be stopped for procedures. Notify our staff if you are taking any blood thinners. Depending on which one you take, there will be specific instructions on how and when to stop it. . Diabetics on insulin: Notify the staff so that you can be scheduled 1st case in the morning. If your diabetes requires high dose insulin, take only  of your normal insulin dose the morning of the procedure and notify the staff that you have done so. . Preventing infections: Shower with an antibacterial soap the morning of your procedure. . Build-up your immune system: Take 1000 mg of Vitamin C with every meal (3 times a day) the day prior to your procedure. . Antibiotics: Inform the staff if you have a condition or reason that requires you to take antibiotics before dental procedures. . Pregnancy: If you are pregnant, call and cancel the procedure. . Sickness: If you have a cold, fever, or any active infections, call and cancel the procedure. . Arrival: You must be in the facility at least 30 minutes prior to your scheduled procedure. . Children: Do not bring children with you. . Dress appropriately:  Bring dark clothing that you would not mind if they get stained. . Valuables: Do not bring any jewelry or valuables.  Reasons to call and reschedule or cancel your procedure: (Following these recommendations will minimize the risk of a serious complication.) . Surgeries: Avoid having procedures within 2 weeks of any surgery. (Avoid for 2 weeks before or after any surgery). . Flu Shots: Avoid having procedures within 2 weeks of a flu shots or . (Avoid for 2 weeks before or after immunizations). . Barium: Avoid having a procedure within 7-10 days after having had a radiological study involving the use of radiological contrast. (Myelograms, Barium swallow or enema study). . Heart attacks: Avoid any elective procedures or surgeries for the initial 6 months after a "Myocardial Infarction" (Heart Attack). . Blood thinners: It is imperative that you stop these medications before procedures. Let us know if you if you take any blood thinner.  . Infection: Avoid procedures during or within two weeks of an infection (including chest colds or gastrointestinal problems). Symptoms associated with infections include: Localized redness, fever, chills, night sweats or profuse sweating, burning sensation when voiding, cough, congestion, stuffiness, runny nose, sore throat, diarrhea, nausea, vomiting, cold or Flu symptoms, recent or current infections. It is specially important if the infection is over the area that we intend to treat. . Heart and lung problems: Symptoms that may suggest an active cardiopulmonary problem include: cough, chest pain, breathing difficulties or shortness of breath, dizziness, ankle swelling, uncontrolled high or unusually low blood pressure, and/or palpitations. If you are experiencing any of these symptoms, cancel your procedure and contact your primary care physician for an evaluation.  Remember:  Regular Business hours are:    Monday to Thursday 8:00 AM to 4:00 PM  Provider's  Schedule: Deny Chevez, MD:  Procedure days: Tuesday and Thursday 7:30 AM to 4:00 PM  Bilal Lateef, MD:  Procedure days: Monday and Wednesday 7:30 AM to 4:00 PM ____________________________________________________________________________________________   ____________________________________________________________________________________________  General Risks and Possible Complications  Patient Responsibilities: It is important that you read this as it is part of your informed consent. It is our duty to inform you of the risks and possible complications associated with treatments offered to you. It is your responsibility as a patient to read this and to ask questions about anything that is not clear or that you believe was not covered in this document.  Patient's Rights: You have the right to refuse treatment. You also have the right to change your mind, even after initially having agreed to have the treatment done. However, under this last option, if you wait until the last second to change your mind, you may be charged for the materials used up to that point.  Introduction: Medicine is not an exact science. Everything in Medicine, including the lack of treatment(s), carries the potential for danger, harm, or loss (which is by definition: Risk). In Medicine, a complication is a secondary problem, condition, or disease that can aggravate an already existing one. All treatments carry the risk of possible complications. The fact that a side effects or complications occurs, does not imply that the treatment was conducted incorrectly. It must be clearly understood that these can happen even when everything is done following the highest safety standards.  No treatment: You can choose not to proceed with the proposed treatment alternative. The "PRO(s)" would include: avoiding the risk of complications associated with the therapy. The "CON(s)" would include: not getting any of the treatment  benefits. These benefits fall under one of three categories: diagnostic; therapeutic; and/or palliative. Diagnostic benefits include: getting information which can ultimately lead to improvement of the disease or symptom(s). Therapeutic benefits are those associated with the successful treatment of the disease. Finally, palliative benefits are those related to the decrease of the primary symptoms, without necessarily curing the condition (example: decreasing the pain from a flare-up of a chronic condition, such as incurable terminal cancer).  General Risks and Complications: These are associated to most interventional treatments. They can occur alone, or in combination. They fall under one of the following six (6) categories: no benefit or worsening of symptoms; bleeding; infection; nerve damage; allergic reactions; and/or death. 1. No benefits or worsening of symptoms: In Medicine there are no guarantees, only probabilities. No healthcare provider can ever guarantee that a medical treatment will work, they can only state the probability that it may. Furthermore, there is always the possibility that the condition may worsen, either directly, or indirectly, as a consequence of the treatment. 2. Bleeding: This is more common if the patient is taking a blood thinner, either prescription or over the counter (example: Goody Powders, Fish oil, Aspirin, Garlic, etc.), or if suffering a condition associated with impaired coagulation (example: Hemophilia, cirrhosis of the liver, low platelet counts, etc.). However, even if you do not have one on these, it can still happen. If you have any of these conditions, or take one of these drugs, make sure to notify your treating physician. 3. Infection: This is more common in patients with a compromised immune system, either due to disease (example: diabetes, cancer, human immunodeficiency virus [HIV], etc.), or due to medications or treatments (example: therapies used to treat  cancer and   rheumatological diseases). However, even if you do not have one on these, it can still happen. If you have any of these conditions, or take one of these drugs, make sure to notify your treating physician. 4. Nerve Damage: This is more common when the treatment is an invasive one, but it can also happen with the use of medications, such as those used in the treatment of cancer. The damage can occur to small secondary nerves, or to large primary ones, such as those in the spinal cord and brain. This damage may be temporary or permanent and it may lead to impairments that can range from temporary numbness to permanent paralysis and/or brain death. 5. Allergic Reactions: Any time a substance or material comes in contact with our body, there is the possibility of an allergic reaction. These can range from a mild skin rash (contact dermatitis) to a severe systemic reaction (anaphylactic reaction), which can result in death. 6. Death: In general, any medical intervention can result in death, most of the time due to an unforeseen complication. ____________________________________________________________________________________________  ____________________________________________________________________________________________  Medication Recommendations and Reminders  Applies to: All patients receiving prescriptions (written and/or electronic).  Medication Rules & Regulations: These rules and regulations exist for your safety and that of others. They are not flexible and neither are we. Dismissing or ignoring them will be considered "non-compliance" with medication therapy, resulting in complete and irreversible termination of such therapy. (See document titled "Medication Rules" for more details.) In all conscience, because of safety reasons, we cannot continue providing a therapy where the patient does not follow instructions.  Pharmacy of record:   Definition: This is the pharmacy where your  electronic prescriptions will be sent.   We do not endorse any particular pharmacy, however, we have experienced problems with [facility] not securing enough medication supply for the community.  We do not restrict you in your choice of pharmacy. However, once we write for your prescriptions, we will NOT be re-sending more prescriptions to fix restricted supply problems created by your pharmacy, or your insurance.   The pharmacy listed in the electronic medical record should be the one where you want electronic prescriptions to be sent.  If you choose to change pharmacy, simply notify our nursing staff.  Recommendations:  Keep all of your pain medications in a safe place, under lock and key, even if you live alone. We will NOT replace lost, stolen, or damaged medication.  After you fill your prescription, take 1 week's worth of pills and put them away in a safe place. You should keep a separate, properly labeled bottle for this purpose. The remainder should be kept in the original bottle. Use this as your primary supply, until it runs out. Once it's gone, then you know that you have 1 week's worth of medicine, and it is time to come in for a prescription refill. If you do this correctly, it is unlikely that you will ever run out of medicine.  To make sure that the above recommendation works, it is very important that you make sure your medication refill appointments are scheduled at least 1 week before you run out of medicine. To do this in an effective manner, make sure that you do not leave the office without scheduling your next medication management appointment. Always ask the nursing staff to show you in your prescription , when your medication will be running out. Then arrange for the receptionist to get you a return appointment, at least 7 days before you  run out of medicine. Do not wait until you have 1 or 2 pills left, to come in. This is very poor planning and does not take into consideration  that we may need to cancel appointments due to bad weather, sickness, or emergencies affecting our staff.  DO NOT ACCEPT A "Partial Fill": If for any reason your pharmacy does not have enough pills/tablets to completely fill or refill your prescription, do not allow for a "partial fill". The law allows the pharmacy to complete that prescription within 72 hours, without requiring a new prescription. If they do not fill the rest of your prescription within those 72 hours, you will need a separate prescription to fill the remaining amount, which we will NOT provide. If the reason for the partial fill is your insurance, you will need to talk to the pharmacist about payment alternatives for the remaining tablets, but again, DO NOT ACCEPT A PARTIAL FILL, unless you can trust your pharmacist to obtain the remainder of the pills within 72 hours.  Prescription refills and/or changes in medication(s):   Prescription refills, and/or changes in dose or medication, will be conducted only during scheduled medication management appointments. (Applies to both, written and electronic prescriptions.)  No refills on procedure days. No medication will be changed or started on procedure days. No changes, adjustments, and/or refills will be conducted on a procedure day. Doing so will interfere with the diagnostic portion of the procedure.  No phone refills. No medications will be "called into the pharmacy".  No Fax refills.  No weekend refills.  No Holliday refills.  No after hours refills.  Remember:  Business hours are:  Monday to Thursday 8:00 AM to 4:00 PM Provider's Schedule: Milinda Pointer, MD - Appointments are:  Medication management: Monday and Wednesday 8:00 AM to 4:00 PM Procedure day: Tuesday and Thursday 7:30 AM to 4:00 PM Gillis Santa, MD - Appointments are:  Medication management: Tuesday and Thursday 8:00 AM to 4:00 PM Procedure day: Monday and Wednesday 7:30 AM to 4:00 PM (Last update:  07/25/2019) ____________________________________________________________________________________________   ____________________________________________________________________________________________  Medication Rules  Purpose: To inform patients, and their family members, of our rules and regulations.  Applies to: All patients receiving prescriptions (written or electronic).  Pharmacy of record: Pharmacy where electronic prescriptions will be sent. If written prescriptions are taken to a different pharmacy, please inform the nursing staff. The pharmacy listed in the electronic medical record should be the one where you would like electronic prescriptions to be sent.  Electronic prescriptions: In compliance with the [place] (STOP) Act of 2017 (Session Lanny [place]amp [phone]), effective January 04, 2018, all controlled substances must be electronically prescribed. Calling prescriptions to the pharmacy will cease to exist.  Prescription refills: Only during scheduled appointments. Applies to all prescriptions.  NOTE: The following applies primarily to controlled substances (Opioid* Pain Medications).   Type of encounter (visit): For patients receiving controlled substances, face-to-face visits are required. (Not an option or up to the patient.)  Patient's responsibilities: 1. Pain Pills: Bring all pain pills to every appointment (except for procedure appointments). 2. Pill Bottles: Bring pills in original pharmacy bottle. Always bring the newest bottle. Bring bottle, even if empty. 3. Medication refills: You are responsible for knowing and keeping track of what medications you take and those you need refilled. The day before your appointment: write a list of all prescriptions that need to be refilled. The day of the appointment: give the list to the admitting nurse. Prescriptions  will be written only during appointments. No prescriptions will be written on  procedure days. If you forget a medication: it will not be "Called in", "Faxed", or "electronically sent". You will need to get another appointment to get these prescribed. No early refills. Do not call asking to have your prescription filled early. 4. Prescription Accuracy: You are responsible for carefully inspecting your prescriptions before leaving our office. Have the discharge nurse carefully go over each prescription with you, before taking them home. Make sure that your name is accurately spelled, that your address is correct. Check the name and dose of your medication to make sure it is accurate. Check the number of pills, and the written instructions to make sure they are clear and accurate. Make sure that you are given enough medication to last until your next medication refill appointment. 5. Taking Medication: Take medication as prescribed. When it comes to controlled substances, taking less pills or less frequently than prescribed is permitted and encouraged. Never take more pills than instructed. Never take medication more frequently than prescribed.  6. Inform other Doctors: Always inform, all of your healthcare providers, of all the medications you take. 7. Pain Medication from other Providers: You are not allowed to accept any additional pain medication from any other Doctor or Healthcare provider. There are two exceptions to this rule. (see below) In the event that you require additional pain medication, you are responsible for notifying us, as stated below. 8. Cough Medicine: Often these contain an opioid, such as codeine or hydrocodone. Never accept or take cough medicine containing these opioids if you are already taking an opioid* medication. The combination may cause respiratory failure and death. 9. Medication Agreement: You are responsible for carefully reading and following our Medication Agreement. This must be signed before receiving any prescriptions from our practice. Safely  store a copy of your signed Agreement. Violations to the Agreement will result in no further prescriptions. (Additional copies of our Medication Agreement are available upon request.) 10. Laws, Rules, & Regulations: All patients are expected to follow all Federal and [employer], [employer], Rules, [employer]. Ignorance of the Laws does not constitute a valid excuse.  11. Illegal drugs and Controlled Substances: The use of illegal substances (including, but not limited to marijuana and its derivatives) and/or the illegal use of any controlled substances is strictly prohibited. Violation of this rule may result in the immediate and permanent discontinuation of any and all prescriptions being written by our practice. The use of any illegal substances is prohibited. 12. Adopted CDC guidelines & recommendations: Target dosing levels will be at or below 60 MME/day. Use of benzodiazepines** is not recommended.  Exceptions: There are only two exceptions to the rule of not receiving pain medications from other Healthcare Providers. 1. Exception #1 (Emergencies): In the event of an emergency (i.e.: accident requiring emergency care), you are allowed to receive additional pain medication. However, you are responsible for: As soon as you are able, call our office [phone], at any time of the day or night, and leave a message stating your name, the date and nature of the emergency, and the name and dose of the medication prescribed. In the event that your call is answered by a member of our staff, make sure to document and save the date, time, and the name of the person that took your information.  2. Exception #2 (Planned Surgery): In the event that you are scheduled by another doctor or dentist to have any type  of surgery or procedure, you are allowed (for a period no longer than 30 days), to receive additional pain medication, for the acute post-op pain. However, in this case, you are responsible for picking up  a copy of our "Post-op Pain Management for Surgeons" handout, and giving it to your surgeon or dentist. This document is available at our office, and does not require an appointment to obtain it. Simply go to our office during business hours (Monday-Thursday from 8:00 AM to 4:00 PM) (Friday 8:00 AM to 12:00 Noon) or if you have a scheduled appointment with [place], prior to your surgery, and ask for it by name. In addition, you are responsible for: calling our office [phone], at any time of the day or night, and leaving a message stating your name, name of your surgeon, type of surgery, and date of procedure or surgery. Failure to comply with your responsibilities may result in termination of therapy involving the controlled substances.  *Opioid medications include: morphine, codeine, oxycodone, oxymorphone, hydrocodone, hydromorphone, meperidine, tramadol, tapentadol, buprenorphine, fentanyl, methadone. **Benzodiazepine medications include: diazepam (Valium), alprazolam (Xanax), clonazepam (Klonopine), lorazepam (Ativan), clorazepate (Tranxene), chlordiazepoxide (Librium), estazolam (Prosom), oxazepam (Serax), temazepam (Restoril), triazolam (Halcion) (Last updated: 12/03/2019) ____________________________________________________________________________________________   Epidural Steroid Injection Patient Information  Description: The epidural space surrounds the nerves as they exit the spinal cord.  In some patients, the nerves can be compressed and inflamed by a bulging disc or a tight spinal canal (spinal stenosis).  By injecting steroids into the epidural space, we can bring irritated nerves into direct contact with a potentially helpful medication.  These steroids act directly on the irritated nerves and can reduce swelling and inflammation which often leads to decreased pain.  Epidural steroids may be injected anywhere along the spine and from the neck to the low back depending upon the location  of your pain.   After numbing the skin with local anesthetic (like Novocaine), a small needle is passed into the epidural space slowly.  You may experience a sensation of pressure while this is being done.  The entire block usually last less than 10 minutes.  Conditions which may be treated by epidural steroids:   Low back and leg pain  Neck and arm pain  Spinal stenosis  Post-laminectomy syndrome  Herpes zoster (shingles) pain  Pain from compression fractures  Preparation for the injection:  1. Do not eat any solid food or dairy products within 8 hours of your appointment.  2. You may drink clear liquids up to 3 hours before appointment.  Clear liquids include water, black coffee, juice or soda.  No milk or cream please. 3. You may take your regular medication, including pain medications, with a sip of water before your appointment  Diabetics should hold regular insulin (if taken separately) and take 1/2 normal NPH dos the morning of the procedure.  Carry some sugar containing items with you to your appointment. 4. A driver must accompany you and be prepared to drive you home after your procedure.  5. Bring all your current medications with your. 6. An IV may be inserted and sedation may be given at the discretion of the physician.   7. A blood pressure cuff, EKG and other monitors will often be applied during the procedure.  Some patients may need to have extra oxygen administered for a short period. 8. You will be asked to provide medical information, including your allergies, prior to the procedure.  We must know immediately if you are taking blood  thinners (like Coumadin/Warfarin)  Or if you are allergic to IV iodine contrast (dye). We must know if you could possible be pregnant.  Possible side-effects:  Bleeding from needle site  Infection (rare, may require surgery)  Nerve injury (rare)  Numbness & tingling (temporary)  Difficulty urinating (rare, temporary)  Spinal  headache ( a headache worse with upright posture)  Light -headedness (temporary)  Pain at injection site (several days)  Decreased blood pressure (temporary)  Weakness in arm/leg (temporary)  Pressure sensation in back/neck (temporary)  Call if you experience:  Fever/chills associated with headache or increased back/neck pain.  Headache worsened by an upright position.  New onset weakness or numbness of an extremity below the injection site  Hives or difficulty breathing (go to the emergency room)  Inflammation or drainage at the infection site  Severe back/neck pain  Any new symptoms which are concerning to you  Please note:  Although the local anesthetic injected can often make your back or neck feel good for several hours after the injection, the pain will likely return.  It takes 3-7 days for steroids to work in the epidural space.  You may not notice any pain relief for at least that one week.  If effective, we will often do a series of three injections spaced 3-6 weeks apart to maximally decrease your pain.  After the initial series, we generally will wait several months before considering a repeat injection of the same type.  If you have any questions, please call [phone] [facility]

## 2020-05-01 ENCOUNTER — Telehealth: Payer: Self-pay

## 2020-05-01 DIAGNOSIS — M4722 Other spondylosis with radiculopathy, cervical region: Secondary | ICD-10-CM

## 2020-05-01 DIAGNOSIS — G8928 Other chronic postprocedural pain: Secondary | ICD-10-CM

## 2020-05-01 DIAGNOSIS — M62838 Other muscle spasm: Secondary | ICD-10-CM

## 2020-05-01 DIAGNOSIS — M5412 Radiculopathy, cervical region: Secondary | ICD-10-CM

## 2020-05-01 DIAGNOSIS — M542 Cervicalgia: Secondary | ICD-10-CM

## 2020-05-01 DIAGNOSIS — M5010 Cervical disc disorder with radiculopathy, unspecified cervical region: Secondary | ICD-10-CM

## 2020-05-01 DIAGNOSIS — M4802 Spinal stenosis, cervical region: Secondary | ICD-10-CM

## 2020-05-01 NOTE — Telephone Encounter (Signed)
Opened in error

## 2020-05-01 NOTE — Telephone Encounter (Signed)
[facility] pain management sent fax requesting that our office initiate a  referral for patient to be seen by Neurosurgery. Patients insurance requires PCP to make referral. Per Dr. Brita Romp "ok" to place referral order. Referral order placed. Please schedule appointment.

## 2020-05-11 ENCOUNTER — Other Ambulatory Visit: Payer: Self-pay | Admitting: Internal Medicine

## 2020-05-14 NOTE — Progress Notes (Signed)
PROVIDER NOTE: Information contained herein reflects review and annotations entered in association with encounter. Interpretation of such information and data should be left to medically-trained personnel. Information provided to patient can be located elsewhere in the medical record under "Patient Instructions". Document created using STT-dictation technology, any transcriptional errors that may result from process are unintentional.    Patient: Caleb [name]  Service Category: Procedure  Provider: Gaspar Cola, MD  DOB: [birth year]  DOS: 05/15/2020  Location: [facility]  MRN: [id number]  Setting: Ambulatory - outpatient  Referring Provider: Virginia Crews, MD  Type: Established Patient  Specialty: Interventional Pain Management  PCP: Virginia Crews, MD   Primary Reason for Visit: Interventional Pain Management Treatment. CC: Neck Pain (Left )  Procedure:          Anesthesia, Analgesia, Anxiolysis:  Type: Palliative, Inter-Laminar, Cervical Epidural Steroid Injection  #7  Region: Posterior Cervico-thoracic Region Level: C7-T1 Laterality: Left-Sided Paramedial  Type: Moderate (Conscious) Sedation combined with Local Anesthesia Indication(s): Analgesia and Anxiety Route: Intravenous (IV) IV Access: Secured Sedation: Meaningful verbal contact was maintained at all times during the procedure  Local Anesthetic: Lidocaine 1-2%  Position: Prone with head of the table was raised to facilitate breathing.   Indications: 1. Cervicalgia   2. DDD (degenerative disc disease), cervical   3. Cervical disc disorder with radiculopathy of cervical region   4. 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 Cervical spondylosis w/ radiculopathy   6. 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 Abnormal MRI, cervical spine (10/02/2019 & 08/13/2017)    Pain Score: Pre-procedure: 8 /10 Post-procedure: 1 /10   Pre-op H&P Assessment:   Caleb [name] is a 65 y.o. (year old), male patient, seen today for interventional treatment. He  has a past surgical history that includes Lumbar laminectomy (1989, 1999); Cervical fusion (1988, 1998); Liver surgery (2015); Partial colectomy (1990); Inguinal hernia repair (Bilateral, 1975); Lithotripsy; Colonoscopy with propofol (N/A, 02/24/2017); Cardiac catheterization (2015); Arthrodesis metatarsal (Right, 11/26/2019); and Lesion removal (Right, 11/26/2019). Caleb [name] has a current medication list which includes the following prescription(s): albuterol, aspirin ec, cetirizine, fluocinolone, fluticasone, hydrochlorothiazide, hydrocodone-acetaminophen, [START ON 05/31/2020] hydrocodone-acetaminophen, [START ON 06/30/2020] hydrocodone-acetaminophen, ibuprofen, isosorbide mononitrate, losartan, melatonin, mometasone, naloxone, nitroglycerin, pregabalin, rosuvastatin, tadalafil, turmeric, anoro ellipta, varenicline, zolpidem, and methocarbamol, and the following Facility-Administered Medications: fentanyl and midazolam. His primarily concern today is the Neck Pain (Left )  Initial Vital Signs:  Pulse/HCG Rate: 70ECG Heart Rate: 67 Temp: (!) 97.2 F (36.2 C) Resp: 16 BP: 104/76 SpO2: 98 %  BMI: Estimated body mass index is 29.35 kg/m as calculated from the following:   Height as of this encounter: 5\' 8"  (1.727 m).   Weight as of this encounter: 193 lb (87.5 kg).  Risk Assessment: Allergies: Reviewed. He is allergic to lisinopril and oxycodone.  Allergy Precautions: None required Coagulopathies: Reviewed. None identified.  Blood-thinner therapy: None at this time Active Infection(s): Reviewed. None identified. Caleb [name] is afebrile  Site Confirmation: Caleb [name] was asked to confirm the procedure and laterality before marking the site Procedure checklist: Completed Consent: Before the procedure and under the influence of no sedative(s), amnesic(s), or anxiolytics, the patient was informed of the  treatment options, risks and possible complications. To fulfill our ethical and legal obligations, as recommended by the American Medical Association's Code of Ethics, I have informed the patient of my clinical impression; the nature and purpose of the treatment or procedure; the risks, benefits, and possible  complications of the intervention; the alternatives, including doing nothing; the risk(s) and benefit(s) of the alternative treatment(s) or procedure(s); and the risk(s) and benefit(s) of doing nothing. The patient was provided information about the general risks and possible complications associated with the procedure. These may include, but are not limited to: failure to achieve desired goals, infection, bleeding, organ or nerve damage, allergic reactions, paralysis, and death. In addition, the patient was informed of those risks and complications associated to Spine-related procedures, such as failure to decrease pain; infection (i.e.: Meningitis, epidural or intraspinal abscess); bleeding (i.e.: epidural hematoma, subarachnoid hemorrhage, or any other type of intraspinal or peri-dural bleeding); organ or nerve damage (i.e.: Any type of peripheral nerve, nerve root, or spinal cord injury) with subsequent damage to sensory, motor, and/or autonomic systems, resulting in permanent pain, numbness, and/or weakness of one or several areas of the body; allergic reactions; (i.e.: anaphylactic reaction); and/or death. Furthermore, the patient was informed of those risks and complications associated with the medications. These include, but are not limited to: allergic reactions (i.e.: anaphylactic or anaphylactoid reaction(s)); adrenal axis suppression; blood sugar elevation that in diabetics may result in ketoacidosis or comma; water retention that in patients with history of congestive heart failure may result in shortness of breath, pulmonary edema, and decompensation with resultant heart failure; weight gain;  swelling or edema; medication-induced neural toxicity; particulate matter embolism and blood vessel occlusion with resultant organ, and/or nervous system infarction; and/or aseptic necrosis of one or more joints. Finally, the patient was informed that Medicine is not an exact science; therefore, there is also the possibility of unforeseen or unpredictable risks and/or possible complications that may result in a catastrophic outcome. The patient indicated having understood very clearly. We have given the patient no guarantees and we have made no promises. Enough time was given to the patient to ask questions, all of which were answered to the patient's satisfaction. Caleb [name] has indicated that he wanted to continue with the procedure. Attestation: I, the ordering provider, attest that I have discussed with the patient the benefits, risks, side-effects, alternatives, likelihood of achieving goals, and potential problems during recovery for the procedure that I have provided informed consent. Date  Time: 05/15/2020  8:49 AM  Pre-Procedure Preparation:  Monitoring: As per clinic protocol. Respiration, ETCO2, SpO2, BP, heart rate and rhythm monitor placed and checked for adequate function Safety Precautions: Patient was assessed for positional comfort and pressure points before starting the procedure. Time-out: I initiated and conducted the "Time-out" before starting the procedure, as per protocol. The patient was asked to participate by confirming the accuracy of the "Time Out" information. Verification of the correct person, site, and procedure were performed and confirmed by me, the nursing staff, and the patient. "Time-out" conducted as per Joint Commission's Universal Protocol (UP.01.01.01). Time: [id number]  Description of Procedure:          Target Area: For Epidural Steroid injections the target is the interlaminar space, initially targeting the lower border of the superior vertebral body  lamina. Approach: Paramedial approach. Area Prepped: Entire PosteriorCervical Region DuraPrep (Iodine Povacrylex [0.7% available iodine] and Isopropyl Alcohol, 74% w/w) Safety Precautions: Aspiration looking for blood return was conducted prior to all injections. At no point did we inject any substances, as a needle was being advanced. No attempts were made at seeking any paresthesias. Safe injection practices and needle disposal techniques used. Medications properly checked for expiration dates. SDV (single dose vial) medications used. Description of the Procedure: Protocol guidelines were  followed. The procedure needle was introduced through the skin, ipsilateral to the reported pain, and advanced to the target area. Bone was contacted and the needle walked caudad, until the lamina was cleared. The epidural space was identified using "loss-of-resistance technique" with 2-3 ml of PF-NaCl (0.9% NSS), in a 5cc LOR glass syringe. Vitals:   05/15/20 0952 05/15/20 1000 05/15/20 1010 05/15/20 1020  BP: 113/67 101/65 98/74 101/67  Pulse: 75     Resp: 18 17 14 16   Temp:  (!) 97.3 F (36.3 C)  (!) 97.2 F (36.2 C)  TempSrc:  Temporal  Temporal  SpO2: 99% 96% 99% 99%  Weight:      Height:        Start Time: 0947 hrs. End Time: 0952 hrs. Materials:  Needle(s) Type: Epidural needle Gauge: 17G Length: 3.5-in Medication(s): Please see orders for medications and dosing details.  Imaging Guidance (Spinal):          Type of Imaging Technique: Fluoroscopy Guidance (Spinal) Indication(s): Assistance in needle guidance and placement for procedures requiring needle placement in or near specific anatomical locations not easily accessible without such assistance. Exposure Time: Please see nurses notes. Contrast: Before injecting any contrast, we confirmed that the patient did not have an allergy to iodine, shellfish, or radiological contrast. Once satisfactory needle placement was completed at the desired  level, radiological contrast was injected. Contrast injected under live fluoroscopy. No contrast complications. See chart for type and volume of contrast used. Fluoroscopic Guidance: I was personally present during the use of fluoroscopy. "Tunnel Vision Technique" used to obtain the best possible view of the target area. Parallax error corrected before commencing the procedure. "Direction-depth-direction" technique used to introduce the needle under continuous pulsed fluoroscopy. Once target was reached, antero-posterior, oblique, and lateral fluoroscopic projection used confirm needle placement in all planes. Images permanently stored in EMR. Interpretation: I personally interpreted the imaging intraoperatively. Adequate needle placement confirmed in multiple planes. Appropriate spread of contrast into desired area was observed. No evidence of afferent or efferent intravascular uptake. No intrathecal or subarachnoid spread observed. Permanent images saved into the patient's record.  Antibiotic Prophylaxis:   Anti-infectives (From admission, onward)   None     Indication(s): None identified  Post-operative Assessment:  Post-procedure Vital Signs:  Pulse/HCG Rate: 7566 Temp: (!) 97.2 F (36.2 C) Resp: 16 BP: 101/67 SpO2: 99 %  EBL: None  Complications: No immediate post-treatment complications observed by team, or reported by patient.  Note: The patient tolerated the entire procedure well. A repeat set of vitals were taken after the procedure and the patient was kept under observation following institutional policy, for this type of procedure. Post-procedural neurological assessment was performed, showing return to baseline, prior to discharge. The patient was provided with post-procedure discharge instructions, including a section on how to identify potential problems. Should any problems arise concerning this procedure, the patient was given instructions to immediately contact us, at any  time, without hesitation. In any case, we plan to contact the patient by telephone for a follow-up status report regarding this interventional procedure.  Comments:  No additional relevant information.  Plan of Care  Orders:  Orders Placed This Encounter  Procedures  . Cervical Epidural Injection    Procedure: Cervical Epidural Steroid Injection/Block Purpose: Palliative Indication(s): Radiculitis and cervicalgia associater with cervical degenerative disc disease.    Scheduling Instructions:     Level(s): C7-T1     Laterality: Left-sided     Sedation: Patient's choice.  Timeframe: Today    Order Specific Question:   Where will this procedure be performed?    Answer:   [facility]    Comments:   by Dr. Dossie Arbour  . [facility] C-ARM 1-60 MIN NO REPORT    Intraoperative interpretation by procedural physician at [place].    Standing Status:   Standing    Number of Occurrences:   1    Order Specific Question:   Reason for exam:    Answer:   Assistance in needle guidance and placement for procedures requiring needle placement in or near specific anatomical locations not easily accessible without such assistance.  . Informed Consent Details: Physician/Practitioner Attestation; Transcribe to consent form and obtain patient signature    Nursing Order: Transcribe to consent form and obtain patient signature. Note: Always confirm laterality of pain with Caleb [name], before procedure.    Order Specific Question:   Physician/Practitioner attestation of informed consent for procedure/surgical case    Answer:   I, the physician/practitioner, attest that I have discussed with the patient the benefits, risks, side effects, alternatives, likelihood of achieving goals and potential problems during recovery for the procedure that I have provided informed consent.    Order Specific Question:   Procedure    Answer:   Cervical Epidural Steroid Injection (CESI) under fluoroscopic  guidance    Order Specific Question:   Physician/Practitioner performing the procedure    Answer:   Hatley Henegar A. Dossie Arbour MD    Order Specific Question:   Indication/Reason    Answer:   Cervicalgia (Neck Pain) with or without Cervical Radiculopathy/Radiculitis (Arm/Shoulder Pain, Numbness, and/or weakness), secondary to Cervical and/or Cervicothoracic Degenerative Disc Disease (DDD), with or without Intervertebral Disc Displacement (IVD  . Provide equipment / supplies at bedside    "Epidural Tray" (Disposable  single use) Catheter: NOT required    Standing Status:   Standing    Number of Occurrences:   1    Order Specific Question:   Specify    Answer:   Epidural Tray   Chronic Opioid Analgesic:  Hydrocodone/APAP 5/325 1 tablet every 6 hours (20 mg/day of hydrocodone).  (Unable to tolerate an oxycodone IR trial due to stomachaches, nausea, headaches, and excessive somnolence.  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   Medications ordered for procedure: Meds ordered this encounter  Medications  . iohexol (OMNIPAQUE) 180 MG/ML injection 10 mL    Must be Myelogram-compatible. If not available, you may substitute with a water-soluble, non-ionic, hypoallergenic, myelogram-compatible radiological contrast medium.  Marland Kitchen lidocaine (XYLOCAINE) 2 % (with pres) injection 400 mg  . lactated ringers infusion 1,000 mL  . midazolam (VERSED) 5 MG/5ML injection 1-2 mg    Make sure Flumazenil is available in the pyxis when using this medication. If oversedation occurs, administer 0.2 mg IV over 15 sec. If after 45 sec no response, administer 0.2 mg again over 1 min; may repeat at 1 min intervals; not to exceed 4 doses (1 mg)  . fentaNYL (SUBLIMAZE) injection 25-50 mcg    Make sure Narcan is available in the pyxis when using this medication. In the event of respiratory depression (RR< 8/min): Titrate NARCAN (naloxone) in increments of 0.1 to 0.2 mg IV at 2-3 minute intervals,  until desired degree of reversal.  . sodium chloride flush (NS) 0.9 % injection 1 mL  . ropivacaine (PF) 2 mg/mL (0.2%) (NAROPIN) injection 1 mL  . dexamethasone (DECADRON) injection 10 mg  Medications administered: We administered iohexol, lidocaine, lactated ringers, midazolam, fentaNYL, sodium chloride flush, ropivacaine (PF) 2 mg/mL (0.2%), and dexamethasone.  See the medical record for exact dosing, route, and time of administration.  Follow-up plan:   Return in about 2 weeks (around 05/29/2020) for procedure day, (afternoon VV), (PPE).       Interventional Therapies  Risk  Complexity Considerations:   WNL   Planned  Pending:   Diagnostic bilateral lumbar facet MBB #1    Under consideration:   Possible spinal cord stimulator trial  Possible bilateral cervical facet RFA   Completed:   Therapeutic left CESI x6(07/03/2019)  Diagnostic bilateral cervical facet blockx2(09/22/2017)  Diagnostic rightCommon Peroneal NB (C-PNB)x2(04/14/2017)  Therapeutic right common peroneal nerve (C-PN) RFA x1(06/21/2017) Diagnostic right superficial peroneal NB (S-PNB) x4 (09/13/2019)  Therapeutic right superficial peroneal nerve (S-PN) RFA x1 (10/25/2017)  Therapeutic right deep peroneal NB (D-PNB) x1 (05/03/2019)  Diagnostic left SI joint block x1 (06/19/2019)  Therapeutic midline serratus posterior TPI/MNB x1 (01/11/2019)    Therapeutic  Palliative (PRN) options:   Palliative left CESI #7 Diagnostic bilateral cervical facet block#3 Palliative right superficial peroneal NB #5  Palliative right superficial peroneal nerve RFA #2       Recent Visits Date Type Provider Dept  04/30/20 Office Visit Milinda Pointer, MD [facility]  Showing recent visits within past 90 days and meeting all other requirements Today's Visits Date Type Provider Dept  05/15/20 Procedure visit Milinda Pointer, MD [facility]  Showing today's visits and meeting all other  requirements Future Appointments Date Type Provider Dept  06/04/20 Appointment Milinda Pointer, MD [facility]  07/30/20 Appointment Milinda Pointer, MD [facility]  Showing future appointments within next 90 days and meeting all other requirements  Disposition: Discharge home  Discharge (Date  Time): 05/15/2020; 1028 hrs.   Primary Care Physician: Virginia Crews, MD Location: [facility] Note by: Gaspar Cola, MD Date: 05/15/2020; Time: 12:11 PM  Disclaimer:  Medicine is not an Chief Strategy Officer. The only guarantee in medicine is that nothing is guaranteed. It is important to note that the decision to proceed with this intervention was based on the information collected from the patient. The Data and conclusions were drawn from the patient's questionnaire, the interview, and the physical examination. Because the information was provided in large part by the patient, it cannot be guaranteed that it has not been purposely or unconsciously manipulated. Every effort has been made to obtain as much relevant data as possible for this evaluation. It is important to note that the conclusions that lead to this procedure are derived in large part from the available data. Always take into account that the treatment will also be dependent on availability of resources and existing treatment guidelines, considered by other Pain Management Practitioners as being common knowledge and practice, at the time of the intervention. For Medico-Legal purposes, it is also important to point out that variation in procedural techniques and pharmacological choices are the acceptable norm. The indications, contraindications, technique, and results of the above procedure should only be interpreted and judged by a Board-Certified Interventional Pain Specialist with extensive familiarity and expertise in the same exact procedure and technique.

## 2020-05-15 ENCOUNTER — Other Ambulatory Visit: Payer: Self-pay

## 2020-05-15 ENCOUNTER — Ambulatory Visit
Admission: RE | Admit: 2020-05-15 | Discharge: 2020-05-15 | Disposition: A | Discharge status: Home / Self Care | Payer: Medicaid Other | Source: Ambulatory Visit | Attending: Pain Medicine | Admitting: Pain Medicine

## 2020-05-15 ENCOUNTER — Encounter: Payer: Self-pay | Admitting: Pain Medicine

## 2020-05-15 ENCOUNTER — Ambulatory Visit (HOSPITAL_BASED_OUTPATIENT_CLINIC_OR_DEPARTMENT_OTHER): Payer: Medicaid Other | Admitting: Pain Medicine

## 2020-05-15 VITALS — BP 101/67 | HR 75 | Temp 97.2°F | Resp 16 | Ht 68.0 in | Wt 193.0 lb

## 2020-05-15 DIAGNOSIS — M5010 Cervical disc disorder with radiculopathy, unspecified cervical region: Secondary | ICD-10-CM

## 2020-05-15 DIAGNOSIS — R937 Abnormal findings on diagnostic imaging of other parts of musculoskeletal system: Secondary | ICD-10-CM | POA: Insufficient documentation

## 2020-05-15 DIAGNOSIS — M4802 Spinal stenosis, cervical region: Secondary | ICD-10-CM | POA: Insufficient documentation

## 2020-05-15 DIAGNOSIS — M542 Cervicalgia: Secondary | ICD-10-CM | POA: Insufficient documentation

## 2020-05-15 DIAGNOSIS — M5030 Other cervical disc degeneration, unspecified cervical region: Secondary | ICD-10-CM | POA: Diagnosis not present

## 2020-05-15 DIAGNOSIS — M5412 Radiculopathy, cervical region: Secondary | ICD-10-CM | POA: Insufficient documentation

## 2020-05-15 DIAGNOSIS — M4722 Other spondylosis with radiculopathy, cervical region: Secondary | ICD-10-CM | POA: Insufficient documentation

## 2020-05-15 MED ORDER — ROPIVACAINE HCL 2 MG/ML IJ SOLN
1.0000 mL | Freq: Once | INTRAMUSCULAR | Status: AC
  Administered 2020-05-15: 1 mL via EPIDURAL
  Filled 2020-05-15: qty 10

## 2020-05-15 MED ORDER — DEXAMETHASONE SODIUM PHOSPHATE 10 MG/ML IJ SOLN
10.0000 mg | Freq: Once | INTRAMUSCULAR | Status: AC
Start: 2020-05-15 — End: 2020-05-15
  Administered 2020-05-15: 10 mg
  Filled 2020-05-15: qty 1

## 2020-05-15 MED ORDER — LACTATED RINGERS IV SOLN
1000.0000 mL | Freq: Once | INTRAVENOUS | Status: AC
  Administered 2020-05-15: 1000 mL via INTRAVENOUS

## 2020-05-15 MED ORDER — LIDOCAINE HCL 2 % IJ SOLN
20.0000 mL | Freq: Once | INTRAMUSCULAR | Status: AC
  Administered 2020-05-15: 400 mg
  Filled 2020-05-15: qty 20

## 2020-05-15 MED ORDER — SODIUM CHLORIDE 0.9% FLUSH
1.0000 mL | Freq: Once | INTRAVENOUS | Status: AC
Start: 2020-05-15 — End: 2020-05-15
  Administered 2020-05-15: 1 mL

## 2020-05-15 MED ORDER — MIDAZOLAM HCL 5 MG/5ML IJ SOLN
1.0000 mg | INTRAMUSCULAR | Status: DC | PRN
  Administered 2020-05-15 (×2): 1 mg via INTRAVENOUS
  Filled 2020-05-15: qty 5

## 2020-05-15 MED ORDER — FENTANYL CITRATE (PF) 100 MCG/2ML IJ SOLN
25.0000 ug | INTRAMUSCULAR | Status: DC | PRN
Start: 2020-05-15 — End: 2020-05-15
  Administered 2020-05-15: 50 ug via INTRAVENOUS
  Filled 2020-05-15: qty 2

## 2020-05-15 MED ORDER — IOHEXOL 180 MG/ML  SOLN
10.0000 mL | Freq: Once | INTRAMUSCULAR | Status: AC
Start: 2020-05-15 — End: 2020-05-15
  Administered 2020-05-15: 10 mL via EPIDURAL
  Filled 2020-05-15: qty 20

## 2020-05-15 NOTE — Patient Instructions (Signed)

## 2020-05-16 ENCOUNTER — Telehealth: Payer: Self-pay

## 2020-05-16 NOTE — Telephone Encounter (Signed)
Called PP, patient states he is more sore today. Instructed to use heat and to wait 3-10 for the steroid to work and if needed to call us back. Patient with understanding

## 2020-05-17 ENCOUNTER — Other Ambulatory Visit: Payer: Self-pay | Admitting: Cardiovascular Disease

## 2020-05-22 ENCOUNTER — Telehealth: Payer: Self-pay | Admitting: Pain Medicine

## 2020-05-22 NOTE — Telephone Encounter (Signed)
Patient states he is no better since procedure on 05-15-20. Explained that it has only been 6 days since procedure, may take 4-10 days to see relief. Unless there is an unexpected problem, no immediate action is required.

## 2020-05-22 NOTE — Telephone Encounter (Signed)
Patient states he is not doing well at all since procedure. Please call him to advise

## 2020-05-26 ENCOUNTER — Telehealth: Payer: Self-pay | Admitting: Pain Medicine

## 2020-05-26 NOTE — Telephone Encounter (Signed)
Patient states he has gotten no relief from last procedure nad would like to talk to MD to discuss another one.  Informed front desk to schedule him a virtual appointment to discuss with Dr Dossie Arbour.

## 2020-06-03 ENCOUNTER — Encounter: Payer: Self-pay | Admitting: Pain Medicine

## 2020-06-03 ENCOUNTER — Telehealth: Payer: Self-pay | Admitting: Pain Medicine

## 2020-06-03 NOTE — Progress Notes (Signed)
No answer. Left message to call our office to review procedure and Meds before appt.

## 2020-06-03 NOTE — Telephone Encounter (Signed)
Patient called by other staff to ask question about tomorrow visit

## 2020-06-04 ENCOUNTER — Other Ambulatory Visit: Payer: Self-pay

## 2020-06-04 ENCOUNTER — Ambulatory Visit: Payer: Medicaid Other | Attending: Pain Medicine | Admitting: Pain Medicine

## 2020-06-04 DIAGNOSIS — M5412 Radiculopathy, cervical region: Secondary | ICD-10-CM | POA: Diagnosis not present

## 2020-06-04 DIAGNOSIS — M5030 Other cervical disc degeneration, unspecified cervical region: Secondary | ICD-10-CM | POA: Diagnosis not present

## 2020-06-04 DIAGNOSIS — M4802 Spinal stenosis, cervical region: Secondary | ICD-10-CM

## 2020-06-04 DIAGNOSIS — Z9889 Other specified postprocedural states: Secondary | ICD-10-CM

## 2020-06-04 DIAGNOSIS — M542 Cervicalgia: Secondary | ICD-10-CM | POA: Diagnosis not present

## 2020-06-04 DIAGNOSIS — M4722 Other spondylosis with radiculopathy, cervical region: Secondary | ICD-10-CM | POA: Diagnosis not present

## 2020-06-04 DIAGNOSIS — M5010 Cervical disc disorder with radiculopathy, unspecified cervical region: Secondary | ICD-10-CM

## 2020-06-04 DIAGNOSIS — G8928 Other chronic postprocedural pain: Secondary | ICD-10-CM

## 2020-06-04 NOTE — Progress Notes (Signed)
Patient: Caleb [name]  Service Category: E/M  Provider: Gaspar Cola, MD  DOB: October 20, 1955  DOS: 06/04/2020  Location: Office  MRN: [id number]  Setting: Ambulatory outpatient  Referring Provider: Virginia Crews, MD  Type: Established Patient  Specialty: Interventional Pain Management  PCP: Virginia Crews, MD  Location: Remote location  Delivery: TeleHealth     Virtual Encounter - Pain Management PROVIDER NOTE: Information contained herein reflects review and annotations entered in association with encounter. Interpretation of such information and data should be left to medically-trained personnel. Information provided to patient can be located elsewhere in the medical record under "Patient Instructions". Document created using STT-dictation technology, any transcriptional errors that may result from process are unintentional.    Contact & Pharmacy Preferred: [phone] Home: [phone] (home) Mobile: [phone] (mobile) E-mail: [email or web address]  [id number] [address]Phone: [phone]: [address]Phone: [phone]: [phone]  Pre-screening  Mr. HScrimaoffered "in-person" vs "virtual" encounter. He indicated preferring virtual for this encounter.   Reason COVID-19*  Social distancing based on CDC and AMA recommendations.   I contacted RMerian Capronon 06/04/2020 via telephone.      I clearly identified myself as FGaspar Cola MD. I verified that I was speaking with the correct person using two identifiers (Name: Caleb [name] and date of birth: 1February 08, 1957.  Consent I sought verbal advanced consent from [name] virtual visit interactions. I informed Mr. HSafiof possible security and privacy concerns, risks, and limitations  associated with providing "not-in-person" medical evaluation and management services. I also informed Mr. HBouletof the availability of "in-person" appointments. Finally, I informed him that there would be a charge for the virtual visit and that he could be  personally, fully or partially, financially responsible for it. Mr. HSardinaexpressed understanding and agreed to proceed.   Historic Elements   Mr. RDEANO TOMASZEWSKIis a 65y.o. year old, male patient evaluated today after our last contact on 06/03/2020. [name] has a past medical history of Chronic foot pain, right (2015), COPD (chronic obstructive pulmonary disease) ([place], Coronary artery disease, Cough syncope, Emphysema lung ([place], Family history of adverse reaction to anesthesia, History of kidney stones, Hyperlipidemia, Hypertension, Kidney stone, Leucocytosis, [place]ocardial infarction ([place] ([place]), and OSA on CPAP. He also  has a past surgical history that includes Lumbar laminectomy (1989, 1999); Cervical fusion (1988, 1998); Liver surgery (2015); Partial colectomy (1990); Inguinal hernia repair (Bilateral, 1975); Lithotripsy; Colonoscopy with propofol (N/A, 02/24/2017); Cardiac catheterization (2015); Arthrodesis metatarsal (Right, 11/26/2019); and Lesion removal (Right, 11/26/2019). Mr. HAhonenhas a current medication list which includes the following prescription(s): albuterol, aspirin ec, cetirizine, fluocinolone, fluticasone, hydrochlorothiazide, hydrocodone-acetaminophen, hydrocodone-acetaminophen, [START ON 06/30/2020] hydrocodone-acetaminophen, ibuprofen, isosorbide mononitrate, losartan, melatonin, methocarbamol, mometasone, naloxone, nitroglycerin, pregabalin, rosuvastatin, tadalafil, turmeric, anoro ellipta, varenicline, and zolpidem. He  reports that he has been smoking cigarettes. He has a 127.50 pack-year smoking history. He has never used smokeless tobacco. He reports current alcohol use of about 4.0 standard drinks of alcohol per week. He  reports that he does not use drugs. Mr. HJablonowskiis allergic to lisinopril and oxycodone.   HPI  Today, he is being contacted for a post-procedure assessment.  According to the patient this particular cervical epidural steroid injection did not seem to work as well as  the prior ones.  The very first cervical epidural that he had done was on 05/17/2017.  This was followed by repeat injections on 06/16/2017, 07/14/2017, 02/21/2018, 12/14/2018, 07/03/2019, and with the very last one having been done on 05/15/2020.  This year demonstrates a pattern where he has been getting benefits that have lasted between 5 and 6 months with each injection.  However, at the very beginning he had the first 3 injections done approximately 1 month apart and that is what I actually provided him with a longer lasting benefit.  With regards to his current symptoms, he indicates that most of his pain is on the left side of his neck left shoulder, and the back of the left arm down to the elbow.  Today we went over the results of his cervical MRI done on 10/02/2019.  I went ahead and explained to the patient the type of symptoms that he would be experiencing with the described pathology.  Interestingly, he indicates that he has no symptoms on the right side, no upper extremity radiculopathy on the right side and on the left side the symptoms are above the level of the elbow.  I have referred this patient several times to neurosurgeons and although they have recommended surgery, he is a little reluctant to doing that.  He indicates that the resize because he feels that doing epidural steroid injections on a PRN basis makes a lot more sense to him than having his cervical spine fused, as he was told that they would have to do.  He indicates that at this point he is not willing to lose the range of motion that he has.  He does have a prior history of a cervical spine surgery but he refers that this was done 20 to 30 years ago when he was still in  his 81s.  Cervical MRI done on 10/02/2019 indicates: IMPRESSION: 1. 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 2. Moderate bilateral C7-T1 neural foraminal stenosis, unchanged.  The patient has been referred to neurosurgery on several occasions.  He has seen Dr. Kristeen Miss as well as Dr. Elayne Guerin for this cervical problems he is experiencing.  Today we have talked about his symptoms and the results of his recent cervical epidural steroid injection and he is interested in trying a second injection to see if this would provide him with some benefit.  Looking at the treatments that he has had in the past and how in 2019 he needed to complete the series of 3 in order to get substantial benefit, I tend to agree that it may be worth it to proceed with 1-2 more injections in order to try to get this pain under control.  Post-Procedure Evaluation  Procedure (05/15/2020): Palliative left cervical ESI #7 under fluoroscopic guidance and IV sedation Pre-procedure pain level: 8/10 Post-procedure: 1/10 (> 50% relief)  Sedation: Sedation provided.  Effectiveness during initial hour after procedure(Ultra-Short Term Relief): 100 %.  Local anesthetic used: Long-acting (4-6 hours) Effectiveness: Defined as any analgesic benefit obtained secondary to the administration of local anesthetics. This carries significant diagnostic value as to the etiological location, or anatomical origin, of the pain. Duration of benefit is expected to coincide with the duration of the local anesthetic used.  Effectiveness during initial 4-6 hours after procedure(Short-Term Relief): 100 %.  Long-term benefit: Defined as any relief past the pharmacologic duration of the local anesthetics.  Effectiveness past the initial 6 hours after procedure(Long-Term Relief): 0 % (  got some relief in sternal and shoulder pain.).  Current benefits: Defined as benefit that persist at this time.    Analgesia:  Back to baseline Function: Back to baseline ROM: Back to baseline  Pharmacotherapy Assessment  Analgesic: Hydrocodone/APAP 5/325 1 tablet every 6 hours (20 mg/day of hydrocodone).  (Unable to tolerate an oxycodone IR trial due to stomachaches, nausea, headaches, and excessive somnolence.  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   Monitoring: [place] PMP: PDMP reviewed during this encounter.       Pharmacotherapy: No side-effects or adverse reactions reported. Compliance: No problems identified. Effectiveness: Clinically acceptable. Plan: Refer to "POC".  UDS:  Summary  Date Value Ref Range Status  09/11/2019 Note  Final    Comment:    ==================================================================== [employer] Select 13 (MW) ==================================================================== Test                             Result       Flag       Units  Drug Present and Declared for Prescription Verification   Hydrocodone                    1156         EXPECTED   ng/mg creat   Hydromorphone                  500          EXPECTED   ng/mg creat   Dihydrocodeine                 90           EXPECTED   ng/mg creat   Norhydrocodone                 1409         EXPECTED   ng/mg creat    Sources of hydrocodone include scheduled prescription medications.    Hydromorphone, dihydrocodeine and norhydrocodone are expected    metabolites of hydrocodone. Hydromorphone and dihydrocodeine are    also available as scheduled prescription medications.  Drug Present not Declared for Prescription Verification   Tramadol                       370          UNEXPECTED ng/mg creat   O-Desmethyltramadol            545          UNEXPECTED ng/mg creat   N-Desmethyltramadol            48           UNEXPECTED ng/mg creat    Source of tramadol is a prescription medication. O-desmethyltramadol    and N-desmethyltramadol are expected metabolites of  tramadol.  ==================================================================== Test                      Result    Flag   Units      Ref Range   Creatinine              176              mg/dL      >=20 ==================================================================== Declared Medications:  The flagging and interpretation on this report are based on the  following declared medications.  Unexpected results may arise from  inaccuracies in the declared medications.   **  Note: The testing scope of this panel includes these medications:   Hydrocodone (Norco)   **Note: The testing scope of this panel does not include the  following reported medications:   Acetaminophen (Norco)  Aspirin  Cetirizine (Zyrtec)  Doxycycline  Fluticasone (Flonase)  Hydrochlorothiazide  Hydrocortisone  Isosorbide (Imdur)  Losartan (Cozaar)  Methocarbamol (Robaxin)  Mometasone  Naloxone (Narcan)  Neomycin  Nitroglycerin (Nitrostat)  Polymyxin B (Polymyxin)  Pregabalin (Lyrica)  Rosuvastatin (Crestor)  Supplement  Tadalafil (Cialis)  Topical  Topical Lidocaine (Lidoderm)  Umeclidinium (Anoro)  Vilanterol (Anoro) ==================================================================== For clinical consultation, please call [phone]. ====================================================================     Laboratory Chemistry Profile   Renal Lab Results  Component Value Date   BUN 16 02/23/2020   CREATININE 0.73 02/23/2020   BCR 18 09/20/2019   GFRAA 112 09/20/2019   GFRNONAA >60 02/23/2020     Hepatic Lab Results  Component Value Date   AST 15 09/20/2019   ALT 14 09/20/2019   ALBUMIN 4.1 09/20/2019   ALKPHOS 67 09/20/2019     Electrolytes Lab Results  Component Value Date   NA 131 (L) 02/23/2020   K 3.6 02/23/2020   CL 95 (L) 02/23/2020   CALCIUM 9.1 02/23/2020   MG 2.0 05/30/2018     Bone Lab Results  Component Value Date   25OHVITD1 32 02/17/2017   25OHVITD2 <1.0  02/17/2017   25OHVITD3 32 02/17/2017   TESTOFREE 5.2 (L) 02/28/2017   TESTOSTERONE 279 02/28/2017     Inflammation (CRP: Acute Phase) (ESR: Chronic Phase) Lab Results  Component Value Date   CRP 8 11/07/2017   ESRSEDRATE 26 11/07/2017       Note: Above Lab results reviewed.  Imaging  DG PAIN CLINIC C-ARM 1-60 MIN NO REPORT Fluoro was used, but no Radiologist interpretation will be provided.  Please refer to "NOTES" tab for provider progress note.  Assessment  The primary encounter diagnosis was Cervical disc disorder with radiculopathy of cervical region. 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  Plan of Care  Problem-specific:  No problem-specific Assessment & Plan notes found for this encounter.  Mr. TAKODA JANOWIAK has a current medication list which includes the following long-term medication(s): albuterol, cetirizine, fluticasone, hydrochlorothiazide, hydrocodone-acetaminophen, hydrocodone-acetaminophen, [START ON 06/30/2020] hydrocodone-acetaminophen, isosorbide mononitrate, losartan, methocarbamol, nitroglycerin, pregabalin, rosuvastatin, tadalafil, and zolpidem.  Pharmacotherapy (Medications Ordered): No orders of the defined types were placed in this encounter.  Orders:  Orders Placed This Encounter  Procedures  . Cervical Epidural Injection    Level(s): C7-T1 Laterality: Left-sided Purpose: Therapeutic Indication(s): Radiculitis and cervicalgia associater with cervical degenerative disc disease.    Standing Status:   Future    Standing Expiration Date:   07/04/2020    Scheduling Instructions:     Procedure: Cervical Epidural Steroid Injection/Block     Sedation: With Sedation.      Timeframe: As soon as schedule allows    Order Specific Question:   Where will this procedure be performed?    Answer:   [facility]    Comments:   by Dr. Dossie Arbour   Follow-up plan:   Return for Procedure (w/ sedation): (L) CESI.      Interventional Therapies  Risk  Complexity Considerations:   WNL   Planned  Pending:   Diagnostic bilateral lumbar facet MBB #  1    Under consideration:   Possible spinal cord stimulator trial  Possible bilateral cervical facet RFA   Completed:   Therapeutic left CESI x7(05/15/2020)  Diagnostic bilateral cervical facet blockx2(09/22/2017)  Diagnostic rightCommon Peroneal NB (C-PNB)x2(04/14/2017)  Therapeutic right common peroneal nerve (C-PN) RFA x1(06/21/2017) Diagnostic right superficial peroneal NB (S-PNB) x4 (09/13/2019)  Therapeutic right superficial peroneal nerve (S-PN) RFA x1 (10/25/2017)  Therapeutic right deep peroneal NB (D-PNB) x1 (05/03/2019)  Diagnostic left SI joint block x1 (06/19/2019)  Therapeutic midline serratus posterior TPI/MNB x1 (01/11/2019)    Therapeutic  Palliative (PRN) options:   Palliative left CESI #7 Diagnostic bilateral cervical facet block#3 Palliative right superficial peroneal NB #5  Palliative right superficial peroneal nerve RFA #2     Recent Visits Date Type Provider Dept  05/15/20 Procedure visit Milinda Pointer, MD [facility]  04/30/20 Office Visit Milinda Pointer, MD [facility]  Showing recent visits within past 90 days and meeting all other requirements Today's Visits Date Type Provider Dept  06/04/20 Telemedicine Milinda Pointer, MD [facility]  Showing today's visits and meeting all other requirements Future Appointments Date Type Provider Dept  07/30/20 Appointment Milinda Pointer, MD [facility]  Showing future appointments within next 90 days and meeting all other requirements  I discussed the assessment and treatment plan  with the patient. The patient was provided an opportunity to ask questions and all were answered. The patient agreed with the plan and demonstrated an understanding of the instructions.  Patient advised to call back or seek an in-person evaluation if the symptoms or condition worsens.  Duration of encounter: 18 minutes.  Note by: Gaspar Cola, MD Date: 06/04/2020; Time: 12:53 PM

## 2020-06-04 NOTE — Patient Instructions (Signed)
____________________________________________________________________________________________  Preparing for Procedure with Sedation  Procedure appointments are limited to planned procedures: . No Prescription Refills. . No disability issues will be discussed. . No medication changes will be discussed.  Instructions: . Oral Intake: Do not eat or drink anything for at least 8 hours prior to your procedure. (Exception: Blood Pressure Medication. See below.) . Transportation: Unless otherwise stated by your physician, you may drive yourself after the procedure. . Blood Pressure Medicine: Do not forget to take your blood pressure medicine with a sip of water the morning of the procedure. If your Diastolic (lower reading)is above 100 mmHg, elective cases will be cancelled/rescheduled. . Blood thinners: These will need to be stopped for procedures. Notify our staff if you are taking any blood thinners. Depending on which one you take, there will be specific instructions on how and when to stop it. . Diabetics on insulin: Notify the staff so that you can be scheduled 1st case in the morning. If your diabetes requires high dose insulin, take only  of your normal insulin dose the morning of the procedure and notify the staff that you have done so. . Preventing infections: Shower with an antibacterial soap the morning of your procedure. . Build-up your immune system: Take 1000 mg of Vitamin C with every meal (3 times a day) the day prior to your procedure. . Antibiotics: Inform the staff if you have a condition or reason that requires you to take antibiotics before dental procedures. . Pregnancy: If you are pregnant, call and cancel the procedure. . Sickness: If you have a cold, fever, or any active infections, call and cancel the procedure. . Arrival: You must be in the facility at least 30 minutes prior to your scheduled procedure. . Children: Do not bring children with you. . Dress appropriately:  Bring dark clothing that you would not mind if they get stained. . Valuables: Do not bring any jewelry or valuables.  Reasons to call and reschedule or cancel your procedure: (Following these recommendations will minimize the risk of a serious complication.) . Surgeries: Avoid having procedures within 2 weeks of any surgery. (Avoid for 2 weeks before or after any surgery). . Flu Shots: Avoid having procedures within 2 weeks of a flu shots or . (Avoid for 2 weeks before or after immunizations). . Barium: Avoid having a procedure within 7-10 days after having had a radiological study involving the use of radiological contrast. (Myelograms, Barium swallow or enema study). . Heart attacks: Avoid any elective procedures or surgeries for the initial 6 months after a "Myocardial Infarction" (Heart Attack). . Blood thinners: It is imperative that you stop these medications before procedures. Let us know if you if you take any blood thinner.  . Infection: Avoid procedures during or within two weeks of an infection (including chest colds or gastrointestinal problems). Symptoms associated with infections include: Localized redness, fever, chills, night sweats or profuse sweating, burning sensation when voiding, cough, congestion, stuffiness, runny nose, sore throat, diarrhea, nausea, vomiting, cold or Flu symptoms, recent or current infections. It is specially important if the infection is over the area that we intend to treat. . Heart and lung problems: Symptoms that may suggest an active cardiopulmonary problem include: cough, chest pain, breathing difficulties or shortness of breath, dizziness, ankle swelling, uncontrolled high or unusually low blood pressure, and/or palpitations. If you are experiencing any of these symptoms, cancel your procedure and contact your primary care physician for an evaluation.  Remember:  Regular Business hours are:    Monday to Thursday 8:00 AM to 4:00 PM  Provider's  Schedule: Natane Heward, MD:  Procedure days: Tuesday and Thursday 7:30 AM to 4:00 PM  Bilal Lateef, MD:  Procedure days: Monday and Wednesday 7:30 AM to 4:00 PM ____________________________________________________________________________________________   ____________________________________________________________________________________________  General Risks and Possible Complications  Patient Responsibilities: It is important that you read this as it is part of your informed consent. It is our duty to inform you of the risks and possible complications associated with treatments offered to you. It is your responsibility as a patient to read this and to ask questions about anything that is not clear or that you believe was not covered in this document.  Patient's Rights: You have the right to refuse treatment. You also have the right to change your mind, even after initially having agreed to have the treatment done. However, under this last option, if you wait until the last second to change your mind, you may be charged for the materials used up to that point.  Introduction: Medicine is not an exact science. Everything in Medicine, including the lack of treatment(s), carries the potential for danger, harm, or loss (which is by definition: Risk). In Medicine, a complication is a secondary problem, condition, or disease that can aggravate an already existing one. All treatments carry the risk of possible complications. The fact that a side effects or complications occurs, does not imply that the treatment was conducted incorrectly. It must be clearly understood that these can happen even when everything is done following the highest safety standards.  No treatment: You can choose not to proceed with the proposed treatment alternative. The "PRO(s)" would include: avoiding the risk of complications associated with the therapy. The "CON(s)" would include: not getting any of the treatment  benefits. These benefits fall under one of three categories: diagnostic; therapeutic; and/or palliative. Diagnostic benefits include: getting information which can ultimately lead to improvement of the disease or symptom(s). Therapeutic benefits are those associated with the successful treatment of the disease. Finally, palliative benefits are those related to the decrease of the primary symptoms, without necessarily curing the condition (example: decreasing the pain from a flare-up of a chronic condition, such as incurable terminal cancer).  General Risks and Complications: These are associated to most interventional treatments. They can occur alone, or in combination. They fall under one of the following six (6) categories: no benefit or worsening of symptoms; bleeding; infection; nerve damage; allergic reactions; and/or death. 1. No benefits or worsening of symptoms: In Medicine there are no guarantees, only probabilities. No healthcare provider can ever guarantee that a medical treatment will work, they can only state the probability that it may. Furthermore, there is always the possibility that the condition may worsen, either directly, or indirectly, as a consequence of the treatment. 2. Bleeding: This is more common if the patient is taking a blood thinner, either prescription or over the counter (example: Goody Powders, Fish oil, Aspirin, Garlic, etc.), or if suffering a condition associated with impaired coagulation (example: Hemophilia, cirrhosis of the liver, low platelet counts, etc.). However, even if you do not have one on these, it can still happen. If you have any of these conditions, or take one of these drugs, make sure to notify your treating physician. 3. Infection: This is more common in patients with a compromised immune system, either due to disease (example: diabetes, cancer, human immunodeficiency virus [HIV], etc.), or due to medications or treatments (example: therapies used to treat  cancer and   rheumatological diseases). However, even if you do not have one on these, it can still happen. If you have any of these conditions, or take one of these drugs, make sure to notify your treating physician. 4. Nerve Damage: This is more common when the treatment is an invasive one, but it can also happen with the use of medications, such as those used in the treatment of cancer. The damage can occur to small secondary nerves, or to large primary ones, such as those in the spinal cord and brain. This damage may be temporary or permanent and it may lead to impairments that can range from temporary numbness to permanent paralysis and/or brain death. 5. Allergic Reactions: Any time a substance or material comes in contact with our body, there is the possibility of an allergic reaction. These can range from a mild skin rash (contact dermatitis) to a severe systemic reaction (anaphylactic reaction), which can result in death. 6. Death: In general, any medical intervention can result in death, most of the time due to an unforeseen complication. ____________________________________________________________________________________________   

## 2020-06-16 ENCOUNTER — Other Ambulatory Visit: Payer: Self-pay | Admitting: Cardiovascular Disease

## 2020-06-17 ENCOUNTER — Other Ambulatory Visit: Payer: Self-pay | Admitting: Family Medicine

## 2020-06-17 NOTE — Telephone Encounter (Signed)
Please advise if ok to refill non cardiac medication Chantix last prescribed by TG.

## 2020-06-17 NOTE — Telephone Encounter (Signed)
Medication Refill - Medication: varenicline (CHANTIX) 1 MG tablet    Preferred Pharmacy (with phone number or street name):  [id number] [place] [address] Phone:  [phone]  Fax:  [phone]      Agent: Please be advised that RX refills may take up to 3 business days. We ask that you follow-up with your pharmacy.

## 2020-06-17 NOTE — Telephone Encounter (Signed)
Requested medication (s) are due for refill today:   Yes  Requested medication (s) are on the active medication list:   Yes  Future visit scheduled:   Yes   Last ordered: 01/16/2020 #60, 3 refills  Returned because rx started by Dr. Rockey Situ however he has deferred this to PCP for refills.  Dr. Brita Romp to review for refills.     Requested Prescriptions  Pending Prescriptions Disp Refills   varenicline (CHANTIX) 1 MG tablet 60 tablet 3    Sig: Take 1 tablet (1 mg total) by mouth 2 (two) times daily.      Psychiatry:  Drug Dependence Therapy Passed - 06/17/2020  8:46 AM      Passed - Valid encounter within last 12 months    Recent Outpatient Visits           2 months ago Encounter for annual physical exam   [facility], Dionne Bucy, MD   3 months ago Fever, unspecified fever cause   [facility], Dionne Bucy, MD   5 months ago Essential hypertension   [place], Dionne Bucy, MD   7 months ago Pre-op evaluation   [facility] [place], Dionne Bucy, MD   9 months ago Essential hypertension   [place], Dionne Bucy, MD       Future Appointments             In 3 days Laurin Coder, MD [facility]   In 2 months [place], Dionne Bucy, MD [facility] [place]   In 4 months Diam[facility] [place], Herbert Seta, [facility]

## 2020-06-17 NOTE — Telephone Encounter (Signed)
Pt will contact pcp.

## 2020-06-19 ENCOUNTER — Other Ambulatory Visit: Payer: Self-pay

## 2020-06-19 ENCOUNTER — Encounter: Payer: Self-pay | Admitting: Pain Medicine

## 2020-06-19 ENCOUNTER — Ambulatory Visit
Admission: RE | Admit: 2020-06-19 | Discharge: 2020-06-19 | Disposition: A | Discharge status: Home / Self Care | Payer: Medicaid Other | Source: Ambulatory Visit | Attending: Pain Medicine | Admitting: Pain Medicine

## 2020-06-19 ENCOUNTER — Ambulatory Visit (HOSPITAL_BASED_OUTPATIENT_CLINIC_OR_DEPARTMENT_OTHER): Payer: Medicaid Other | Admitting: Pain Medicine

## 2020-06-19 VITALS — BP 107/79 | HR 62 | Temp 96.2°F | Resp 13 | Ht 68.0 in | Wt 190.0 lb

## 2020-06-19 DIAGNOSIS — M542 Cervicalgia: Secondary | ICD-10-CM

## 2020-06-19 DIAGNOSIS — M5030 Other cervical disc degeneration, unspecified cervical region: Secondary | ICD-10-CM | POA: Diagnosis not present

## 2020-06-19 DIAGNOSIS — M62838 Other muscle spasm: Secondary | ICD-10-CM | POA: Insufficient documentation

## 2020-06-19 DIAGNOSIS — R937 Abnormal findings on diagnostic imaging of other parts of musculoskeletal system: Secondary | ICD-10-CM | POA: Diagnosis not present

## 2020-06-19 DIAGNOSIS — Z9889 Other specified postprocedural states: Secondary | ICD-10-CM | POA: Insufficient documentation

## 2020-06-19 DIAGNOSIS — M47812 Spondylosis without myelopathy or radiculopathy, cervical region: Secondary | ICD-10-CM | POA: Insufficient documentation

## 2020-06-19 DIAGNOSIS — M5412 Radiculopathy, cervical region: Secondary | ICD-10-CM | POA: Insufficient documentation

## 2020-06-19 DIAGNOSIS — M5010 Cervical disc disorder with radiculopathy, unspecified cervical region: Secondary | ICD-10-CM | POA: Insufficient documentation

## 2020-06-19 DIAGNOSIS — G8928 Other chronic postprocedural pain: Secondary | ICD-10-CM | POA: Insufficient documentation

## 2020-06-19 DIAGNOSIS — M4722 Other spondylosis with radiculopathy, cervical region: Secondary | ICD-10-CM | POA: Diagnosis not present

## 2020-06-19 DIAGNOSIS — M4802 Spinal stenosis, cervical region: Secondary | ICD-10-CM

## 2020-06-19 MED ORDER — IOHEXOL 180 MG/ML  SOLN
10.0000 mL | Freq: Once | INTRAMUSCULAR | Status: AC
  Administered 2020-06-19: 5 mL via EPIDURAL

## 2020-06-19 MED ORDER — SODIUM CHLORIDE (PF) 0.9 % IJ SOLN
INTRAMUSCULAR | Status: AC
  Filled 2020-06-19: qty 10

## 2020-06-19 MED ORDER — LIDOCAINE HCL 2 % IJ SOLN
20.0000 mL | Freq: Once | INTRAMUSCULAR | Status: AC
  Administered 2020-06-19: 200 mg

## 2020-06-19 MED ORDER — DEXAMETHASONE SODIUM PHOSPHATE 10 MG/ML IJ SOLN
10.0000 mg | Freq: Once | INTRAMUSCULAR | Status: AC
  Administered 2020-06-19: 10 mg
  Filled 2020-06-19: qty 1

## 2020-06-19 MED ORDER — MIDAZOLAM HCL 5 MG/5ML IJ SOLN
INTRAMUSCULAR | Status: AC
  Filled 2020-06-19: qty 5

## 2020-06-19 MED ORDER — LACTATED RINGERS IV SOLN
1000.0000 mL | Freq: Once | INTRAVENOUS | Status: AC
  Administered 2020-06-19: 1000 mL via INTRAVENOUS

## 2020-06-19 MED ORDER — LIDOCAINE HCL (PF) 2 % IJ SOLN
INTRAMUSCULAR | Status: AC
  Filled 2020-06-19: qty 10

## 2020-06-19 MED ORDER — MIDAZOLAM HCL 5 MG/5ML IJ SOLN
1.0000 mg | INTRAMUSCULAR | Status: DC | PRN
  Administered 2020-06-19: 2 mg via INTRAVENOUS

## 2020-06-19 MED ORDER — SODIUM CHLORIDE 0.9% FLUSH
1.0000 mL | Freq: Once | INTRAVENOUS | Status: AC
  Administered 2020-06-19: 1 mL

## 2020-06-19 MED ORDER — ROPIVACAINE HCL 2 MG/ML IJ SOLN
1.0000 mL | Freq: Once | INTRAMUSCULAR | Status: AC
  Administered 2020-06-19: 1 mL via EPIDURAL

## 2020-06-19 MED ORDER — FENTANYL CITRATE (PF) 100 MCG/2ML IJ SOLN
25.0000 ug | INTRAMUSCULAR | Status: DC | PRN
Start: 2020-06-19 — End: 2020-06-19

## 2020-06-19 MED ORDER — ROPIVACAINE HCL 2 MG/ML IJ SOLN
INTRAMUSCULAR | Status: AC
  Filled 2020-06-19: qty 20

## 2020-06-19 NOTE — Progress Notes (Signed)
Safety precautions to be maintained throughout the outpatient stay will include: orient to surroundings, keep bed in low position, maintain call bell within reach at all times, provide assistance with transfer out of bed and ambulation.  

## 2020-06-19 NOTE — Progress Notes (Signed)
PROVIDER NOTE: Information contained herein reflects review and annotations entered in association with encounter. Interpretation of such information and data should be left to medically-trained personnel. Information provided to patient can be located elsewhere in the medical record under "Patient Instructions". Document created using STT-dictation technology, any transcriptional errors that may result from process are unintentional.    Patient: Caleb [name]  Service Category: Procedure  Provider: Gaspar Cola, MD  DOB: [birth year]  DOS: 06/19/2020  Location: [facility]  MRN: [id number]  Setting: Ambulatory - outpatient  Referring Provider: Milinda Pointer, MD  Type: Established Patient  Specialty: Interventional Pain Management  PCP: Virginia Crews, MD   Primary Reason for Visit: Interventional Pain Management Treatment. CC: Neck Pain  Procedure:          Anesthesia, Analgesia, Anxiolysis:  Type: Palliative, Inter-Laminar, Cervical Epidural Steroid Injection           Region: Posterior Cervico-thoracic Region Level: C7-T1 Laterality: Left-Sided Paramedial  Type: Moderate (Conscious) Sedation combined with Local Anesthesia Indication(s): Analgesia and Anxiety Route: Intravenous (IV) IV Access: Secured Sedation: Meaningful verbal contact was maintained at all times during the procedure  Local Anesthetic: Lidocaine 1-2%  Position: Prone with head of the table was raised to facilitate breathing.   Indications: 1. Cervicalgia   2. Cervical spondylosis w/ radiculopathy   3. 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 DDD (degenerative disc disease), cervical   5. 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 Cervical disc disorder with radiculopathy of cervical region   7. Cervical paraspinal muscle spasm   8. Cervical facet syndrome (Bilateral)   9. Chronic neck pain with history of cervical spinal surgery   10.  Abnormal MRI, cervical spine (10/02/2019 & 08/13/2017)    Pain Score: Pre-procedure: 6 /10 Post-procedure: 0-No pain/10   Pre-op H&P Assessment:  Caleb [name] is a 65 y.o. (year old), male patient, seen today for interventional treatment. He  has a past surgical history that includes Lumbar laminectomy (1989, 1999); Cervical fusion (1988, 1998); Liver surgery (2015); Partial colectomy (1990); Inguinal hernia repair (Bilateral, 1975); Lithotripsy; Colonoscopy with propofol (N/A, 02/24/2017); Cardiac catheterization (2015); Arthrodesis metatarsal (Right, 11/26/2019); and Lesion removal (Right, 11/26/2019). Caleb [name] has a current medication list which includes the following prescription(s): albuterol, aspirin ec, cetirizine, fluocinolone, fluticasone, hydrochlorothiazide, hydrocodone-acetaminophen, [START ON 06/30/2020] hydrocodone-acetaminophen, ibuprofen, isosorbide mononitrate, losartan, melatonin, mometasone, naloxone, nitroglycerin, pregabalin, rosuvastatin, tadalafil, turmeric, anoro ellipta, varenicline, hydrocodone-acetaminophen, methocarbamol, and zolpidem, and the following Facility-Administered Medications: fentanyl and midazolam. His primarily concern today is the Neck Pain  Initial Vital Signs:  Pulse/HCG Rate: 62ECG Heart Rate: 69 Temp: (!) 96.2 F (35.7 C) Resp: 16 BP: (!) 119/91 SpO2: 100 %  BMI: Estimated body mass index is 28.89 kg/m as calculated from the following:   Height as of this encounter: 5\' 8"  (1.727 m).   Weight as of this encounter: 190 lb (86.2 kg).  Risk Assessment: Allergies: Reviewed. He is allergic to lisinopril and oxycodone.  Allergy Precautions: None required Coagulopathies: Reviewed. None identified.  Blood-thinner therapy: None at this time Active Infection(s): Reviewed. None identified. Caleb [name] is afebrile  Site Confirmation: Caleb [name] was asked to confirm the procedure and laterality before marking the site Procedure checklist:  Completed Consent: Before the procedure and under the influence of no sedative(s), amnesic(s), or anxiolytics, the patient was informed of the treatment options, risks and possible complications. To fulfill our ethical and legal obligations, as recommended by the [employer]'s  Code of Ethics, I have informed the patient of my clinical impression; the nature and purpose of the treatment or procedure; the risks, benefits, and possible complications of the intervention; the alternatives, including doing nothing; the risk(s) and benefit(s) of the alternative treatment(s) or procedure(s); and the risk(s) and benefit(s) of doing nothing. The patient was provided information about the general risks and possible complications associated with the procedure. These may include, but are not limited to: failure to achieve desired goals, infection, bleeding, organ or nerve damage, allergic reactions, paralysis, and death. In addition, the patient was informed of those risks and complications associated to Spine-related procedures, such as failure to decrease pain; infection (i.e.: Meningitis, epidural or intraspinal abscess); bleeding (i.e.: epidural hematoma, subarachnoid hemorrhage, or any other type of intraspinal or peri-dural bleeding); organ or nerve damage (i.e.: Any type of peripheral nerve, nerve root, or spinal cord injury) with subsequent damage to sensory, motor, and/or autonomic systems, resulting in permanent pain, numbness, and/or weakness of one or several areas of the body; allergic reactions; (i.e.: anaphylactic reaction); and/or death. Furthermore, the patient was informed of those risks and complications associated with the medications. These include, but are not limited to: allergic reactions (i.e.: anaphylactic or anaphylactoid reaction(s)); adrenal axis suppression; blood sugar elevation that in diabetics may result in ketoacidosis or comma; water retention that in patients with history  of congestive heart failure may result in shortness of breath, pulmonary edema, and decompensation with resultant heart failure; weight gain; swelling or edema; medication-induced neural toxicity; particulate matter embolism and blood vessel occlusion with resultant organ, and/or nervous system infarction; and/or aseptic necrosis of one or more joints. Finally, the patient was informed that Medicine is not an exact science; therefore, there is also the possibility of unforeseen or unpredictable risks and/or possible complications that may result in a catastrophic outcome. The patient indicated having understood very clearly. We have given the patient no guarantees and we have made no promises. Enough time was given to the patient to ask questions, all of which were answered to the patient's satisfaction. Mr. Mccrystal has indicated that he wanted to continue with the procedure. Attestation: I, the ordering provider, attest that I have discussed with the patient the benefits, risks, side-effects, alternatives, likelihood of achieving goals, and potential problems during recovery for the procedure that I have provided informed consent. Date  Time: 06/19/2020  9:07 AM  Pre-Procedure Preparation:  Monitoring: As per clinic protocol. Respiration, ETCO2, SpO2, BP, heart rate and rhythm monitor placed and checked for adequate function Safety Precautions: Patient was assessed for positional comfort and pressure points before starting the procedure. Time-out: I initiated and conducted the "Time-out" before starting the procedure, as per protocol. The patient was asked to participate by confirming the accuracy of the "Time Out" information. Verification of the correct person, site, and procedure were performed and confirmed by me, the nursing staff, and the patient. "Time-out" conducted as per Joint Commission's Universal Protocol (UP.01.01.01). Time: 0950  Description of Procedure:          Target Area: For Epidural  Steroid injections the target is the interlaminar space, initially targeting the lower border of the superior vertebral body lamina. Approach: Paramedial approach. Area Prepped: Entire PosteriorCervical Region DuraPrep (Iodine Povacrylex [0.7% available iodine] and Isopropyl Alcohol, 74% w/w) Safety Precautions: Aspiration looking for blood return was conducted prior to all injections. At no point did we inject any substances, as a needle was being advanced. No attempts were made at seeking any paresthesias. Safe  injection practices and needle disposal techniques used. Medications properly checked for expiration dates. SDV (single dose vial) medications used. Description of the Procedure: Protocol guidelines were followed. The procedure needle was introduced through the skin, ipsilateral to the reported pain, and advanced to the target area. Bone was contacted and the needle walked caudad, until the lamina was cleared. The epidural space was identified using "loss-of-resistance technique" with 2-3 ml of PF-NaCl (0.9% NSS), in a 5cc LOR glass syringe. Vitals:   06/19/20 0955 06/19/20 0958 06/19/20 1008 06/19/20 1018  BP: 124/86 119/86 116/80 100/77  Pulse:      Resp: 18 17 12 12   Temp:      TempSrc:      SpO2: 99% 99% 96% 96%  Weight:      Height:        Start Time: 0950 hrs. End Time: 0957 hrs. Materials:  Needle(s) Type: Epidural needle Gauge: 17G Length: 3.5-in Medication(s): Please see orders for medications and dosing details.  Imaging Guidance (Spinal):          Type of Imaging Technique: Fluoroscopy Guidance (Spinal) Indication(s): Assistance in needle guidance and placement for procedures requiring needle placement in or near specific anatomical locations not easily accessible without such assistance. Exposure Time: Please see nurses notes. Contrast: Before injecting any contrast, we confirmed that the patient did not have an allergy to iodine, shellfish, or radiological contrast.  Once satisfactory needle placement was completed at the desired level, radiological contrast was injected. Contrast injected under live fluoroscopy. No contrast complications. See chart for type and volume of contrast used. Fluoroscopic Guidance: I was personally present during the use of fluoroscopy. "Tunnel Vision Technique" used to obtain the best possible view of the target area. Parallax error corrected before commencing the procedure. "Direction-depth-direction" technique used to introduce the needle under continuous pulsed fluoroscopy. Once target was reached, antero-posterior, oblique, and lateral fluoroscopic projection used confirm needle placement in all planes. Images permanently stored in EMR. Interpretation: I personally interpreted the imaging intraoperatively. Adequate needle placement confirmed in multiple planes. Appropriate spread of contrast into desired area was observed. No evidence of afferent or efferent intravascular uptake. No intrathecal or subarachnoid spread observed. Permanent images saved into the patient's record.  Antibiotic Prophylaxis:   Anti-infectives (From admission, onward)    None      Indication(s): None identified  Post-operative Assessment:  Post-procedure Vital Signs:  Pulse/HCG Rate: 6260 Temp: (!) 96.2 F (35.7 C) Resp: 12 BP: 100/77 SpO2: 96 %  EBL: None  Complications: No immediate post-treatment complications observed by team, or reported by patient.  Note: The patient tolerated the entire procedure well. A repeat set of vitals were taken after the procedure and the patient was kept under observation following institutional policy, for this type of procedure. Post-procedural neurological assessment was performed, showing return to baseline, prior to discharge. The patient was provided with post-procedure discharge instructions, including a section on how to identify potential problems. Should any problems arise concerning this procedure, the  patient was given instructions to immediately contact us, at any time, without hesitation. In any case, we plan to contact the patient by telephone for a follow-up status report regarding this interventional procedure.  Comments:  No additional relevant information.  Plan of Care  Orders:  Orders Placed This Encounter  Procedures   Cervical Epidural Injection    Procedure: Cervical Epidural Steroid Injection/Block Purpose: Palliative Indication(s): Radiculitis and cervicalgia associater with cervical degenerative disc disease.    Scheduling Instructions:  Level(s): C7-T1     Laterality: Left-sided     Sedation: With Sedation.     Timeframe: Today    Order Specific Question:   Where will this procedure be performed?    Answer:   [facility]    Comments:   by Dr. Fortunato [facility] C-[facility] 1-60 MIN NO REPORT    Intraoperative interpretation by procedural physician at [place].    Standing Status:   Standing    Number of Occurrences:   1    Order Specific Question:   Reason for exam:    Answer:   Assistance in needle guidance and placement for procedures requiring needle placement in or near specific anatomical locations not easily accessible without such assistance.   Informed Consent Details: Physician/Practitioner Attestation; Transcribe to consent form and obtain patient signature    Nursing Order: Transcribe to consent form and obtain patient signature. Note: Always confirm laterality of pain with Mr. Goodlin, before procedure.    Order Specific Question:   Physician/Practitioner attestation of informed consent for procedure/surgical case    Answer:   I, the physician/practitioner, attest that I have discussed with the patient the benefits, risks, side effects, alternatives, likelihood of achieving goals and potential problems during recovery for the procedure that I have provided informed consent.    Order Specific Question:   Procedure    Answer:    Cervical Epidural Steroid Injection (CESI) under fluoroscopic guidance    Order Specific Question:   Physician/Practitioner performing the procedure    Answer:   Chasidy Janak A. Dossie Arbour MD    Order Specific Question:   Indication/Reason    Answer:   Cervicalgia (Neck Pain) with or without Cervical Radiculopathy/Radiculitis (Arm/Shoulder Pain, Numbness, and/or weakness), secondary to Cervical and/or Cervicothoracic Degenerative Disc Disease (DDD), with or without Intervertebral Disc Displacement (IVD   Provide equipment / supplies at bedside    "Epidural Tray" (Disposable  single use) Catheter: NOT required    Standing Status:   Standing    Number of Occurrences:   1    Order Specific Question:   Specify    Answer:   Epidural Tray    Chronic Opioid Analgesic:  Hydrocodone/APAP 5/325 1 tablet every 6 hours (20 mg/day of hydrocodone).  (Unable to tolerate an oxycodone IR trial due to stomachaches, nausea, headaches, and excessive somnolence.  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   Medications ordered for procedure: Meds ordered this encounter  Medications   iohexol (OMNIPAQUE) 180 MG/ML injection 10 mL    Must be Myelogram-compatible. If not available, you may substitute with a water-soluble, non-ionic, hypoallergenic, myelogram-compatible radiological contrast medium.   lidocaine (XYLOCAINE) 2 % (with pres) injection 400 mg   lactated ringers infusion 1,000 mL   midazolam (VERSED) 5 MG/5ML injection 1-2 mg    Make sure Flumazenil is available in the pyxis when using this medication. If oversedation occurs, administer 0.2 mg IV over 15 sec. If after 45 sec no response, administer 0.2 mg again over 1 min; may repeat at 1 min intervals; not to exceed 4 doses (1 mg)   fentaNYL (SUBLIMAZE) injection 25-50 mcg    Make sure Narcan is available in the pyxis when using this medication. In the event of respiratory depression (RR< 8/min): Titrate NARCAN (naloxone)  in increments of 0.1 to 0.2 mg IV at 2-3 minute intervals, until desired degree of reversal.   sodium chloride flush (NS) 0.9 % injection 1 mL  ropivacaine (PF) 2 mg/mL (0.2%) (NAROPIN) injection 1 mL   dexamethasone (DECADRON) injection 10 mg    Medications administered: We administered iohexol, lidocaine, lactated ringers, midazolam, sodium chloride flush, ropivacaine (PF) 2 mg/mL (0.2%), and dexamethasone.  See the medical record for exact dosing, route, and time of administration.  Follow-up plan:   Return in about 2 weeks (around 07/03/2020) for procedure day (afternoon VV) (PPE).      Interventional Therapies  Risk  Complexity Considerations:   WNL   Planned  Pending:   Diagnostic bilateral lumbar facet MBB #1    Under consideration:   Possible spinal cord stimulator trial  Possible bilateral cervical facet RFA    Completed:   Therapeutic left CESI x7 (05/15/2020)  Diagnostic bilateral cervical facet block x2 (09/22/2017)  Diagnostic right Common Peroneal NB (C-PNB) x2 (04/14/2017)  Therapeutic right common peroneal nerve (C-PN) RFA x1 (06/21/2017)  Diagnostic right superficial peroneal NB (S-PNB) x4 (09/13/2019)  Therapeutic right superficial peroneal nerve (S-PN) RFA x1 (10/25/2017)  Therapeutic right deep peroneal NB (D-PNB) x1 (05/03/2019)  Diagnostic left SI joint block x1 (06/19/2019)  Therapeutic midline serratus posterior TPI/MNB x1 (01/11/2019)    Therapeutic  Palliative (PRN) options:   Palliative left CESI #7  Diagnostic bilateral cervical facet block #3  Palliative right superficial peroneal NB #5  Palliative right superficial peroneal nerve RFA #2      Recent Visits Date Type Provider Dept  06/04/20 Telemedicine Milinda Pointer, MD [facility]  05/15/20 Procedure visit Milinda Pointer, MD [facility]  04/30/20 Office Visit Milinda Pointer, MD [facility]  Showing recent visits within past 90 days and meeting all other  requirements Today's Visits Date Type Provider Dept  06/19/20 Procedure visit Milinda Pointer, MD [facility]  Showing today's visits and meeting all other requirements Future Appointments Date Type Provider Dept  07/02/20 Appointment Milinda Pointer, MD [facility]  07/30/20 Appointment Milinda Pointer, MD [facility]  Showing future appointments within next 90 days and meeting all other requirements Disposition: Discharge home  Discharge (Date  Time): 06/19/2020; 1030 hrs.   Primary Care Physician: Virginia Crews, MD Location: [facility] Note by: Gaspar Cola, MD Date: 06/19/2020; Time: 10:18 AM  Disclaimer:  Medicine is not an Chief Strategy Officer. The only guarantee in medicine is that nothing is guaranteed. It is important to note that the decision to proceed with this intervention was based on the information collected from the patient. The Data and conclusions were drawn from the patient's questionnaire, the interview, and the physical examination. Because the information was provided in large part by the patient, it cannot be guaranteed that it has not been purposely or unconsciously manipulated. Every effort has been made to obtain as much relevant data as possible for this evaluation. It is important to note that the conclusions that lead to this procedure are derived in large part from the available data. Always take into account that the treatment will also be dependent on availability of resources and existing treatment guidelines, considered by other Pain Management Practitioners as being common knowledge and practice, at the time of the intervention. For Medico-Legal purposes, it is also important to point out that variation in procedural techniques and pharmacological choices are the acceptable norm. The indications, contraindications, technique, and results of the above procedure should only be interpreted  and judged by a Board-Certified Interventional Pain Specialist with extensive familiarity and expertise in the same exact procedure and technique.

## 2020-06-19 NOTE — Patient Instructions (Signed)

## 2020-06-20 ENCOUNTER — Encounter: Payer: Self-pay | Admitting: Pulmonary Disease

## 2020-06-20 ENCOUNTER — Telehealth: Payer: Self-pay

## 2020-06-20 ENCOUNTER — Ambulatory Visit: Payer: Medicaid Other | Admitting: Pulmonary Disease

## 2020-06-20 VITALS — BP 100/70 | HR 73 | Temp 98.4°F | Ht 68.0 in | Wt 197.6 lb

## 2020-06-20 DIAGNOSIS — G4733 Obstructive sleep apnea (adult) (pediatric): Secondary | ICD-10-CM | POA: Diagnosis not present

## 2020-06-20 MED ORDER — MONTELUKAST SODIUM 10 MG PO TABS: 10.0000 mg | ORAL_TABLET | Freq: Every day | ORAL | 6 refills | Status: DC

## 2020-06-20 NOTE — Patient Instructions (Signed)
I will see you back in about 3 months  Medications that you can use to replace Zyrtec will include 1.  Xyzal 2.  Allegra  I did add Singulair to her medications  All the above should help with your allergies  Ask your dentist about whether they do work with sleep apnea and if they do not, they might be able to tell you somebody that does work with sleep apnea locally  On oral device is an option of treatment for sleep apnea if you are not able to get back to using BiPAP on a regular basis  Call with significant concerns
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
## 2020-06-20 NOTE — Telephone Encounter (Signed)
Post procedure phone call.  Patient states he is doing great.  
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
## 2020-06-24 ENCOUNTER — Other Ambulatory Visit: Payer: Self-pay

## 2020-06-24 ENCOUNTER — Telehealth: Payer: Self-pay | Admitting: Family Medicine

## 2020-06-24 ENCOUNTER — Other Ambulatory Visit: Payer: Self-pay | Admitting: Internal Medicine

## 2020-06-24 DIAGNOSIS — S8411XS Injury of peroneal nerve at lower leg level, right leg, sequela: Secondary | ICD-10-CM

## 2020-06-24 DIAGNOSIS — M792 Neuralgia and neuritis, unspecified: Secondary | ICD-10-CM

## 2020-06-24 DIAGNOSIS — G894 Chronic pain syndrome: Secondary | ICD-10-CM

## 2020-06-24 MED ORDER — PREGABALIN 150 MG PO CAPS: 150.0000 mg | ORAL_CAPSULE | Freq: Three times a day (TID) | ORAL | 2 refills | Status: DC

## 2020-06-24 NOTE — Telephone Encounter (Signed)
Copied from [place] [id number]. Topic: General - Other >> Jun 24, 2020 11:10 AM Pawlus, Monica A wrote: Reason for CRM: Pt was following up on a refill request for a prescription that has ended - pregabalin (LYRICA) 150 MG capsule [[id number]]  ENDED. Please advise pt if this can be refilled and [facility] to [id number] [place], [place].

## 2020-06-24 NOTE — Telephone Encounter (Signed)
Requested medication (s) are due for refill today: yes   Requested medication (s) are on the active medication list: yes    Future visit scheduled: yes  Notes to clinic: this refill cannot be delegated    Requested Prescriptions  Pending Prescriptions Disp Refills   pregabalin (LYRICA) 150 MG capsule [Pharmacy Med Name: PREGABALIN 150 MG CAPSULE] 90 capsule 2    Sig: TAKE 1 CAPSULE BY MOUTH 3 TIMES DAILY.      Not Delegated - Neurology:  Anticonvulsants - Controlled Failed - 06/24/2020 11:06 AM      Failed - This refill cannot be delegated      Passed - Valid encounter within last 12 months    Recent Outpatient Visits           3 months ago Encounter for annual physical exam   [facility] [place], Dionne Bucy, MD   3 months ago Fever, unspecified fever cause   [facility], Dionne Bucy, MD   5 months ago Essential hypertension   [place], Dionne Bucy, MD   7 months ago Pre-op evaluation   [facility] [place], Dionne Bucy, MD   9 months ago Essential hypertension   [place], Dionne Bucy, MD       Future Appointments             In 1 week Milinda Pointer, MD [place]   In 2 months Bacigalupo, Dionne Bucy, MD [facility] [place]   In 4 months [facility] [place], Herbert Seta, [facility]

## 2020-06-25 ENCOUNTER — Ambulatory Visit: Payer: Self-pay

## 2020-06-25 NOTE — Telephone Encounter (Signed)
Reason for Disposition  [1] SEVERE abdominal pain AND [2] present > 1 hour  Answer Assessment - Initial Assessment Questions 1. ONSET: "When did the pain begin?"      Today 2. LOCATION: "Where does it hurt?" (upper, mid or lower back)     Right flank 3. SEVERITY: "How bad is the pain?"  (e.g., Scale 1-10; mild, moderate, or severe)   - MILD (1-3): doesn't interfere with normal activities    - MODERATE (4-7): interferes with normal activities or awakens from sleep    - SEVERE (8-10): excruciating pain, unable to do any normal activities      9 4. PATTERN: "Is the pain constant?" (e.g., yes, no; constant, intermittent)      Constant 5. RADIATION: "Does the pain shoot into your legs or elsewhere?"     No 6. CAUSE:  "What do you think is causing the back pain?"      Kidney stone 7. BACK OVERUSE:  "Any recent lifting of heavy objects, strenuous work or exercise?"     No 8. MEDICATIONS: "What have you taken so far for the pain?" (e.g., nothing, acetaminophen, NSAIDS)     Ice and heat 9. NEUROLOGIC SYMPTOMS: "Do you have any weakness, numbness, or problems with bowel/bladder control?"     No 10. OTHER SYMPTOMS: "Do you have any other symptoms?" (e.g., fever, abdominal pain, burning with urination, blood in urine)       No 11. PREGNANCY: "Is there any chance you are pregnant?" (e.g., yes, no; LMP)       N/a  Protocols used: Back Pain-A-AH

## 2020-06-25 NOTE — Telephone Encounter (Signed)
FYI

## 2020-06-25 NOTE — Telephone Encounter (Signed)
Patient is calling into the office to report he went to the [facility]- he was advised they could not diagnosed him and he needed to call PCP. Patient advised the office can see if an appointment can be scheduled- but it may be tomorrow. Patient states he is in severe pain and can not wait until tomorrow and will go to ED although he scared that he will get COVID- he has not been vaccinated. Patient disconnected call- he is going to ED.

## 2020-06-25 NOTE — Telephone Encounter (Signed)
Patient called and advised [facility], advised [facility] make sure [facility] because it's saying received on this end. He verbalized understanding.

## 2020-06-25 NOTE — Telephone Encounter (Signed)
Pt. Reports he has a kidney stone in the past. "Years ago and I know that's what's going on now." No availability in the practice. Pt. Going to UC.

## 2020-06-25 NOTE — Telephone Encounter (Signed)
Patient called in about medication,

## 2020-06-25 NOTE — Telephone Encounter (Signed)
Patient called in about, medication, PREGABALIN 150 MG CAPSULE] hHe is completely out of the medication.

## 2020-06-26 NOTE — Telephone Encounter (Signed)
Noted  

## 2020-06-27 ENCOUNTER — Ambulatory Visit: Payer: Self-pay | Admitting: Family Medicine

## 2020-07-01 ENCOUNTER — Encounter: Payer: Self-pay | Admitting: Pain Medicine

## 2020-07-01 NOTE — Progress Notes (Signed)
Patient: Caleb [name]  Service Category: E/M  Provider: Gaspar Cola, MD  DOB: 07-Jul-1955  DOS: 07/02/2020  Location: Office  MRN: [id number]  Setting: Ambulatory outpatient  Referring Provider: Virginia Crews, MD  Type: Established Patient  Specialty: Interventional Pain Management  PCP: Virginia Crews, MD  [place]  Delivery: TeleHealth     Virtual Encounter - Pain Management PROVIDER NOTE: Information contained herein reflects review and annotations entered in association with encounter. Interpretation of such information and data should be left to medically-trained personnel. Information provided to patient can be located elsewhere in the medical record under "Patient Instructions". Document created using STT-dictation technology, any transcriptional errors that may result from process are unintentional.    Contact & Pharmacy Preferred: [phone] Home: [phone] (home) Mobile: [phone] (mobile) E-mail: [email or web address]  [id number] [address] [address]Phone: [phone]: [phone] [id number]-[place] [address] [address]: [phone]: [phone]  Pre-screening  Mr. HPooleoffered "in-person" vs "virtual" encounter. He indicated preferring virtual for this encounter.   Reason COVID-19*  Social distancing based on CDC and AMA recommendations.   I contacted RMerian Capronon 07/02/2020 via telephone.      I clearly identified myself as FGaspar Cola MD. I verified that I was speaking with the correct person using two identifiers (Name: RJUVENAL UMAR and date of birth: [birth year].  Consent I sought verbal advanced consent from [name] virtual visit interactions. I informed Mr. HMerryof possible security and privacy concerns, risks, and  limitations associated with providing "not-in-person" medical evaluation and management services. I also informed Mr. HCoopriderof the availability of "in-person" appointments. Finally, I informed him that there would be a charge for the virtual visit and that he could be  personally, fully or partially, financially responsible for it. Mr. HHolquinexpressed understanding and agreed to proceed.   Historic Elements   Mr. RSALAR MOLDENis a 65y.o. year old, male patient evaluated today after our last contact on 06/19/2020. Mr. HCaldera has a past medical history of Chronic foot pain, right (2015), COPD (chronic obstructive pulmonary disease) ([place], Coronary artery disease, Cough syncope, Emphysema lung ([place], Family history of adverse reaction to anesthesia, History of kidney stones, Hyperlipidemia, Hypertension, Kidney stone, Leucocytosis, Myocardial infarction ([place] (2015), and OSA on CPAP. He also  has a past surgical history that includes Lumbar laminectomy (1989, 1999); Cervical fusion (1988, 1998); Liver surgery (2015); Partial colectomy (1990); Inguinal hernia repair (Bilateral, 1975); Lithotripsy; Colonoscopy with propofol (N/A, 02/24/2017); Cardiac catheterization (2015); Arthrodesis metatarsal (Right, 11/26/2019); and Lesion removal (Right, 11/26/2019). Mr. HBrigantehas a current medication list which includes the following prescription(s): albuterol, anoro ellipta, aspirin ec, cetirizine, fluocinolone, fluticasone, hydrochlorothiazide, hydrocodone-acetaminophen, [START ON 07/30/2020] hydrocodone-acetaminophen, ibuprofen, isosorbide mononitrate, losartan, melatonin, mometasone, montelukast, naloxone, nitroglycerin, pregabalin, rosuvastatin, tadalafil, turmeric, varenicline, zolpidem, and methocarbamol. He  reports that he has quit smoking. His smoking use included cigarettes. He has a 127.50 pack-year smoking history. He has quit using smokeless tobacco. He reports current alcohol use of about 4.0 standard drinks  of alcohol per week. He reports that he does not use drugs. Mr. HMckimis allergic to lisinopril and oxycodone.   HPI  Today, he is being contacted for both, medication management and a post-procedure assessment.  The patient indicates having attained a 100% relief  of the pain for the duration of the local anesthetic which then went down to an ongoing 80% benefit.  These procedures continued to give him good relief of the pain.  Unfortunately the pain continues to return.  Prior to this last cervical epidural steroid injection on 06/19/2020, the prior one had been on 05/15/2020.  The 1 before that had been on 07/03/2019.   The patient indicates doing well with the current medication regimen. No adverse reactions or side effects reported to the medications.   He does indicate having some low back pain that again is across the lower portion of his back with no lower extremity pain.  Hyperextension on rotation does seem to reproduce the pain.  He has a known history of a lumbar facet syndrome.  His first bilateral lumbar facet block was done on 01/29/2020 and the benefits that lasted until recently when it started to come back.  He would like to have the procedure repeated.  RTCB: 08/29/2020 Nonopioids transfer 11/21/2019: Lyrica and Robaxin  Post-Procedure Evaluation  Procedure (06/19/2020): Palliative left C7-T1 cervical ESI #8 under fluoroscopic guidance and IV sedation Pre-procedure pain level: 6/10 Post-procedure: 0/10 (100% relief)  Anxiolysis: Moderate conscious sedation.  Effectiveness during initial hour after procedure (Ultra-Short Term Relief): 100 %.  Local anesthetic used: Long-acting (4-6 hours) Effectiveness: Defined as any analgesic benefit obtained secondary to the administration of local anesthetics. This carries significant diagnostic value as to the etiological location, or anatomical origin, of the pain. Duration of benefit is expected to coincide with the duration of the local  anesthetic used.  Effectiveness during initial 4-6 hours after procedure (Short-Term Relief): 100 %.  Long-term benefit: Defined as any relief past the pharmacologic duration of the local anesthetics.  Effectiveness past the initial 6 hours after procedure (Long-Term Relief): 80 %.  Benefits, current: Defined as benefit present at the time of this evaluation.   Analgesia:   The patient indicates currently having an ongoing 80% relief of his cervicalgia and cervical radiculitis. Function: Mr. Barbary reports improvement in function ROM: Mr. Jarmon reports improvement in ROM  Pharmacotherapy Assessment  Analgesic: Hydrocodone/APAP 5/325 1 tablet every 6 hours (20 mg/day of hydrocodone).  (Unable to tolerate an oxycodone IR trial due to stomachaches, nausea, headaches, and excessive somnolence.  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   Monitoring: [place] PMP: PDMP reviewed during this encounter.       Pharmacotherapy: No side-effects or adverse reactions reported. Compliance: No problems identified. Effectiveness: Clinically acceptable. Plan: Refer to "POC".  UDS:  Summary  Date Value Ref Range Status  09/11/2019 Note  Final    Comment:    ==================================================================== [employer] Select 13 (MW) ==================================================================== Test                             Result       Flag       Units  Drug Present and Declared for Prescription Verification   Hydrocodone                    1156         EXPECTED   ng/mg creat   Hydromorphone                  500          EXPECTED   ng/mg creat   Dihydrocodeine  90           EXPECTED   ng/mg creat   Norhydrocodone                 1409         EXPECTED   ng/mg creat    Sources of hydrocodone include scheduled prescription medications.    Hydromorphone, dihydrocodeine and norhydrocodone are expected    metabolites of  hydrocodone. Hydromorphone and dihydrocodeine are    also available as scheduled prescription medications.  Drug Present not Declared for Prescription Verification   Tramadol                       370          UNEXPECTED ng/mg creat   O-Desmethyltramadol            545          UNEXPECTED ng/mg creat   N-Desmethyltramadol            48           UNEXPECTED ng/mg creat    Source of tramadol is a prescription medication. O-desmethyltramadol    and N-desmethyltramadol are expected metabolites of tramadol.  ==================================================================== Test                      Result    Flag   Units      Ref Range   Creatinine              176              mg/dL      >=20 ==================================================================== Declared Medications:  The flagging and interpretation on this report are based on the  following declared medications.  Unexpected results may arise from  inaccuracies in the declared medications.   **Note: The testing scope of this panel includes these medications:   Hydrocodone (Norco)   **Note: The testing scope of this panel does not include the  following reported medications:   Acetaminophen (Norco)  Aspirin  Cetirizine (Zyrtec)  Doxycycline  Fluticasone (Flonase)  Hydrochlorothiazide  Hydrocortisone  Isosorbide (Imdur)  Losartan (Cozaar)  Methocarbamol (Robaxin)  Mometasone  Naloxone (Narcan)  Neomycin  Nitroglycerin (Nitrostat)  Polymyxin B (Polymyxin)  Pregabalin (Lyrica)  Rosuvastatin (Crestor)  Supplement  Tadalafil (Cialis)  Topical  Topical Lidocaine (Lidoderm)  Umeclidinium (Anoro)  Vilanterol (Anoro) ==================================================================== For clinical consultation, please call [phone]. ====================================================================     Laboratory Chemistry Profile   Renal Lab Results  Component Value Date   BUN 16 02/23/2020    CREATININE 0.73 02/23/2020   BCR 18 09/20/2019   GFRAA 112 09/20/2019   GFRNONAA >60 02/23/2020    Hepatic Lab Results  Component Value Date   AST 15 09/20/2019   ALT 14 09/20/2019   ALBUMIN 4.1 09/20/2019   ALKPHOS 67 09/20/2019    Electrolytes Lab Results  Component Value Date   NA 131 (L) 02/23/2020   K 3.6 02/23/2020   CL 95 (L) 02/23/2020   CALCIUM 9.1 02/23/2020   MG 2.0 05/30/2018    Bone Lab Results  Component Value Date   25OHVITD1 32 02/17/2017   25OHVITD2 <1.0 02/17/2017   25OHVITD3 32 02/17/2017   TESTOFREE 5.2 (L) 02/28/2017   TESTOSTERONE 279 02/28/2017    Inflammation (CRP: Acute Phase) (ESR: Chronic Phase) Lab Results  Component Value Date   CRP 8 11/07/2017   ESRSEDRATE 26 11/07/2017  Note: Above Lab results reviewed.  Imaging  DG PAIN CLINIC C-ARM 1-60 MIN NO REPORT Fluoro was used, but no Radiologist interpretation will be provided.  Please refer to "NOTES" tab for provider progress note.  Assessment  The primary encounter diagnosis was Cervicalgia. Diagnoses of Cervical paraspinal muscle spasm, Cervical facet syndrome (Bilateral), Chronic neck pain with history of cervical spinal surgery, Abnormal MRI, cervical spine (10/02/2019 & 08/13/2017), Chronic pain syndrome, Chronic low back pain (Bilateral) w/o sciatica, DDD (degenerative disc disease), lumbosacral, and Lumbar facet syndrome (Bilateral) were also pertinent to this visit.  Plan of Care  Problem-specific:  No problem-specific Assessment & Plan notes found for this encounter.  Mr. ROBLEY MATASSA has a current medication list which includes the following long-term medication(s): albuterol, cetirizine, fluticasone, hydrochlorothiazide, hydrocodone-acetaminophen, [START ON 07/30/2020] hydrocodone-acetaminophen, isosorbide mononitrate, losartan, montelukast, nitroglycerin, pregabalin, rosuvastatin, tadalafil, zolpidem, and methocarbamol.  Pharmacotherapy (Medications Ordered): Meds  ordered this encounter  Medications   HYDROcodone-acetaminophen (NORCO) 7.5-325 MG tablet    Sig: Take 1 tablet by mouth every 6 (six) hours as needed for severe pain. Must last 30 days.    Dispense:  120 tablet    Refill:  0    Not a duplicate. Do NOT delete! Dispense 1 day early if closed on fill date. Warn not to take CNS-depressants 8 hours before or after taking opioid. Do not send refill request. Renewal requires appointment.    Orders:  Orders Placed This Encounter  Procedures   LUMBAR FACET(MEDIAL BRANCH NERVE BLOCK) MBNB    Standing Status:   Future    Standing Expiration Date:   08/01/2020    Scheduling Instructions:     Procedure: Lumbar facet block (AKA.: Lumbosacral medial branch nerve block)     Side: Bilateral     Level: L3-4, L4-5, & L5-S1 Facets (L2, L3, L4, L5, & S1 Medial Branch Nerves)     Sedation: Patient's choice.     Timeframe: ASAA    Order Specific Question:   Where will this procedure be performed?    Answer:   [facility]    Follow-up plan:   Return for Procedure (w/ sedation): (B) L-FCT Blk #2.      Interventional Therapies  Risk  Complexity Considerations:   Estimated body mass index is 30.04 kg/m as calculated from the following:   Height as of 06/20/20: _0  (1.727 m).   Weight as of 06/20/20: 197 lb 9.6 oz (89.6 kg). WNL   Planned  Pending:   Diagnostic bilateral lumbar facet MBB #2    Under consideration:   Possible spinal cord stimulator trial  Possible bilateral cervical facet RFA    Completed:   Therapeutic/palliative left CESI x8 (06/19/2020) (6-0) (100/100/80/80)  Diagnostic bilateral cervical facet MBB x2 (09/22/2017) (5-1) (100/100/100/>50)  Diagnostic bilateral lumbar facet MBB x1 (01/29/2020) (3-0) (100/100/90/90)  Diagnostic left SI joint block x1 (06/19/2019) (8-0) (100/100/100 x3 days/0)  Therapeutic midline serratus posterior TPI/MNB x1 (01/11/2019)  Diagnostic right Common Peroneal NB (C-PNB) x2 (04/14/2017) (5-0)  (100/100/20/<25)  Therapeutic right common peroneal nerve (C-PN) RFA x1 (06/21/2017) (3-0) (100/100/100 x 3 days/75-100)  Diagnostic right superficial peroneal (S-PN) NB x4 (09/13/2019) (7-3) (100/100/100/90-100) Therapeutic right superficial peroneal nerve (S-PN) RFA x1 (10/25/2017) (4-0) (100/100/100 x1 day/25) Therapeutic right deep peroneal NB (D-PNB) x1 (05/03/2019)    Therapeutic  Palliative (PRN) options:   Palliative left CESI  Diagnostic bilateral cervical facet block #3  Palliative right superficial peroneal NB #5  Palliative right superficial peroneal nerve RFA #2  Recent Visits Date Type Provider Dept  06/19/20 Procedure visit Milinda Pointer, MD [facility]  06/04/20 Telemedicine Milinda Pointer, MD [facility]  05/15/20 Procedure visit Milinda Pointer, MD [facility]  04/30/20 Office Visit Milinda Pointer, MD [facility]  Showing recent visits within past 90 days and meeting all other requirements Today's Visits Date Type Provider Dept  07/02/20 Telemedicine Milinda Pointer, MD [facility]  Showing today's visits and meeting all other requirements Future Appointments Date Type Provider Dept  07/30/20 Appointment Milinda Pointer, MD [facility]  Showing future appointments within next 90 days and meeting all other requirements I discussed the assessment and treatment plan with the patient. The patient was provided an opportunity to ask questions and all were answered. The patient agreed with the plan and demonstrated an understanding of the instructions.  Patient advised to call back or seek an in-person evaluation if the symptoms or condition worsens.  Duration of encounter: 15 minutes.  Note by: Gaspar Cola, MD Date: 07/02/2020; Time: 12:32 PM

## 2020-07-02 ENCOUNTER — Ambulatory Visit: Payer: Medicaid Other | Attending: Pain Medicine | Admitting: Pain Medicine

## 2020-07-02 ENCOUNTER — Other Ambulatory Visit: Payer: Self-pay

## 2020-07-02 DIAGNOSIS — M47816 Spondylosis without myelopathy or radiculopathy, lumbar region: Secondary | ICD-10-CM | POA: Diagnosis not present

## 2020-07-02 DIAGNOSIS — M47812 Spondylosis without myelopathy or radiculopathy, cervical region: Secondary | ICD-10-CM | POA: Diagnosis not present

## 2020-07-02 DIAGNOSIS — M62838 Other muscle spasm: Secondary | ICD-10-CM

## 2020-07-02 DIAGNOSIS — R937 Abnormal findings on diagnostic imaging of other parts of musculoskeletal system: Secondary | ICD-10-CM | POA: Diagnosis not present

## 2020-07-02 DIAGNOSIS — Z9889 Other specified postprocedural states: Secondary | ICD-10-CM | POA: Diagnosis not present

## 2020-07-02 DIAGNOSIS — G8929 Other chronic pain: Secondary | ICD-10-CM

## 2020-07-02 DIAGNOSIS — M5137 Other intervertebral disc degeneration, lumbosacral region: Secondary | ICD-10-CM | POA: Diagnosis not present

## 2020-07-02 DIAGNOSIS — G894 Chronic pain syndrome: Secondary | ICD-10-CM

## 2020-07-02 DIAGNOSIS — M542 Cervicalgia: Secondary | ICD-10-CM

## 2020-07-02 DIAGNOSIS — M545 Low back pain, unspecified: Secondary | ICD-10-CM

## 2020-07-02 DIAGNOSIS — G8928 Other chronic postprocedural pain: Secondary | ICD-10-CM | POA: Diagnosis not present

## 2020-07-02 MED ORDER — HYDROCODONE-ACETAMINOPHEN 7.5-325 MG PO TABS: 1.0000 | ORAL_TABLET | Freq: Four times a day (QID) | ORAL | 0 refills | Status: DC | PRN

## 2020-07-02 NOTE — Patient Instructions (Signed)
____________________________________________________________________________________________  Preparing for Procedure with Sedation  Procedure appointments are limited to planned procedures: No Prescription Refills. No disability issues will be discussed. No medication changes will be discussed.  Instructions: Oral Intake: Do not eat or drink anything for at least 8 hours prior to your procedure. (Exception: Blood Pressure Medication. See below.) Transportation: Unless otherwise stated by your physician, you may drive yourself after the procedure. Blood Pressure Medicine: Do not forget to take your blood pressure medicine with a sip of water the morning of the procedure. If your Diastolic (lower reading)is above 100 mmHg, elective cases will be cancelled/rescheduled. Blood thinners: These will need to be stopped for procedures. Notify our staff if you are taking any blood thinners. Depending on which one you take, there will be specific instructions on how and when to stop it. Diabetics on insulin: Notify the staff so that you can be scheduled 1st case in the morning. If your diabetes requires high dose insulin, take only  of your normal insulin dose the morning of the procedure and notify the staff that you have done so. Preventing infections: Shower with an antibacterial soap the morning of your procedure. Build-up your immune system: Take 1000 mg of Vitamin C with every meal (3 times a day) the day prior to your procedure. Antibiotics: Inform the staff if you have a condition or reason that requires you to take antibiotics before dental procedures. Pregnancy: If you are pregnant, call and cancel the procedure. Sickness: If you have a cold, fever, or any active infections, call and cancel the procedure. Arrival: You must be in the facility at least 30 minutes prior to your scheduled procedure. Children: Do not bring children with you. Dress appropriately: Bring dark clothing that you would  not mind if they get stained. Valuables: Do not bring any jewelry or valuables.  Reasons to call and reschedule or cancel your procedure: (Following these recommendations will minimize the risk of a serious complication.) Surgeries: Avoid having procedures within 2 weeks of any surgery. (Avoid for 2 weeks before or after any surgery). Flu Shots: Avoid having procedures within 2 weeks of a flu shots or . (Avoid for 2 weeks before or after immunizations). Barium: Avoid having a procedure within 7-10 days after having had a radiological study involving the use of radiological contrast. (Myelograms, Barium swallow or enema study). Heart attacks: Avoid any elective procedures or surgeries for the initial 6 months after a "Myocardial Infarction" (Heart Attack). Blood thinners: It is imperative that you stop these medications before procedures. Let us know if you if you take any blood thinner.  Infection: Avoid procedures during or within two weeks of an infection (including chest colds or gastrointestinal problems). Symptoms associated with infections include: Localized redness, fever, chills, night sweats or profuse sweating, burning sensation when voiding, cough, congestion, stuffiness, runny nose, sore throat, diarrhea, nausea, vomiting, cold or Flu symptoms, recent or current infections. It is specially important if the infection is over the area that we intend to treat. Heart and lung problems: Symptoms that may suggest an active cardiopulmonary problem include: cough, chest pain, breathing difficulties or shortness of breath, dizziness, ankle swelling, uncontrolled high or unusually low blood pressure, and/or palpitations. If you are experiencing any of these symptoms, cancel your procedure and contact your primary care physician for an evaluation.  Remember:  Regular Business hours are:  Monday to Thursday 8:00 AM to 4:00 PM  Provider's Schedule: Shekelia Boutin, MD:  Procedure days: Tuesday and  Thursday 7:30 AM   to 4:00 PM  Bilal Lateef, MD:  Procedure days: Monday and Wednesday 7:30 AM to 4:00 PM ____________________________________________________________________________________________  ____________________________________________________________________________________________  General Risks and Possible Complications  Patient Responsibilities: It is important that you read this as it is part of your informed consent. It is our duty to inform you of the risks and possible complications associated with treatments offered to you. It is your responsibility as a patient to read this and to ask questions about anything that is not clear or that you believe was not covered in this document.  Patient's Rights: You have the right to refuse treatment. You also have the right to change your mind, even after initially having agreed to have the treatment done. However, under this last option, if you wait until the last second to change your mind, you may be charged for the materials used up to that point.  Introduction: Medicine is not an exact science. Everything in Medicine, including the lack of treatment(s), carries the potential for danger, harm, or loss (which is by definition: Risk). In Medicine, a complication is a secondary problem, condition, or disease that can aggravate an already existing one. All treatments carry the risk of possible complications. The fact that a side effects or complications occurs, does not imply that the treatment was conducted incorrectly. It must be clearly understood that these can happen even when everything is done following the highest safety standards.  No treatment: You can choose not to proceed with the proposed treatment alternative. The "PRO(s)" would include: avoiding the risk of complications associated with the therapy. The "CON(s)" would include: not getting any of the treatment benefits. These benefits fall under one of three categories: diagnostic;  therapeutic; and/or palliative. Diagnostic benefits include: getting information which can ultimately lead to improvement of the disease or symptom(s). Therapeutic benefits are those associated with the successful treatment of the disease. Finally, palliative benefits are those related to the decrease of the primary symptoms, without necessarily curing the condition (example: decreasing the pain from a flare-up of a chronic condition, such as incurable terminal cancer).  General Risks and Complications: These are associated to most interventional treatments. They can occur alone, or in combination. They fall under one of the following six (6) categories: no benefit or worsening of symptoms; bleeding; infection; nerve damage; allergic reactions; and/or death. No benefits or worsening of symptoms: In Medicine there are no guarantees, only probabilities. No healthcare provider can ever guarantee that a medical treatment will work, they can only state the probability that it may. Furthermore, there is always the possibility that the condition may worsen, either directly, or indirectly, as a consequence of the treatment. Bleeding: This is more common if the patient is taking a blood thinner, either prescription or over the counter (example: Goody Powders, Fish oil, Aspirin, Garlic, etc.), or if suffering a condition associated with impaired coagulation (example: Hemophilia, cirrhosis of the liver, low platelet counts, etc.). However, even if you do not have one on these, it can still happen. If you have any of these conditions, or take one of these drugs, make sure to notify your treating physician. Infection: This is more common in patients with a compromised immune system, either due to disease (example: diabetes, cancer, human immunodeficiency virus [HIV], etc.), or due to medications or treatments (example: therapies used to treat cancer and rheumatological diseases). However, even if you do not have one on  these, it can still happen. If you have any of these conditions, or take one of   these drugs, make sure to notify your treating physician. Nerve Damage: This is more common when the treatment is an invasive one, but it can also happen with the use of medications, such as those used in the treatment of cancer. The damage can occur to small secondary nerves, or to large primary ones, such as those in the spinal cord and brain. This damage may be temporary or permanent and it may lead to impairments that can range from temporary numbness to permanent paralysis and/or brain death. Allergic Reactions: Any time a substance or material comes in contact with our body, there is the possibility of an allergic reaction. These can range from a mild skin rash (contact dermatitis) to a severe systemic reaction (anaphylactic reaction), which can result in death. Death: In general, any medical intervention can result in death, most of the time due to an unforeseen complication. ____________________________________________________________________________________________  

## 2020-07-15 ENCOUNTER — Telehealth: Payer: Self-pay

## 2020-07-15 NOTE — Telephone Encounter (Signed)
Insurance denied authorization for his lumbar facets. No PT no 6 weeks conservative therapy

## 2020-07-23 ENCOUNTER — Other Ambulatory Visit: Payer: Self-pay

## 2020-07-23 DIAGNOSIS — G4733 Obstructive sleep apnea (adult) (pediatric): Secondary | ICD-10-CM

## 2020-07-24 ENCOUNTER — Other Ambulatory Visit: Payer: Self-pay

## 2020-07-24 ENCOUNTER — Encounter: Payer: Self-pay | Admitting: Family Medicine

## 2020-07-24 ENCOUNTER — Ambulatory Visit: Payer: Medicaid Other | Admitting: Adult Health

## 2020-07-24 ENCOUNTER — Ambulatory Visit (INDEPENDENT_AMBULATORY_CARE_PROVIDER_SITE_OTHER): Payer: Medicaid Other | Admitting: Family Medicine

## 2020-07-24 VITALS — BP 111/72 | HR 77 | Temp 98.1°F | Ht 68.0 in | Wt 199.3 lb

## 2020-07-24 DIAGNOSIS — M7918 Myalgia, other site: Secondary | ICD-10-CM

## 2020-07-24 DIAGNOSIS — G8929 Other chronic pain: Secondary | ICD-10-CM | POA: Diagnosis not present

## 2020-07-24 DIAGNOSIS — Z720 Tobacco use: Secondary | ICD-10-CM

## 2020-07-24 DIAGNOSIS — M7711 Lateral epicondylitis, right elbow: Secondary | ICD-10-CM | POA: Diagnosis not present

## 2020-07-24 MED ORDER — METHOCARBAMOL 750 MG PO TABS
750.0000 mg | ORAL_TABLET | Freq: Three times a day (TID) | ORAL | 2 refills | Status: DC | PRN
Start: 2020-07-24 — End: 2020-10-17

## 2020-07-24 MED ORDER — NITROGLYCERIN 0.4 MG SL SUBL: 0.4000 mg | SUBLINGUAL_TABLET | SUBLINGUAL | 3 refills | Status: DC | PRN

## 2020-07-24 MED ORDER — NALOXONE HCL 4 MG/0.1ML NA LIQD: NASAL | 0 refills | Status: AC

## 2020-07-24 MED ORDER — VARENICLINE TARTRATE 1 MG PO TABS: 1.0000 mg | ORAL_TABLET | Freq: Two times a day (BID) | ORAL | 3 refills | Status: DC
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
## 2020-07-25 ENCOUNTER — Other Ambulatory Visit: Payer: Self-pay | Admitting: Family Medicine

## 2020-07-25 DIAGNOSIS — L400 Psoriasis vulgaris: Secondary | ICD-10-CM

## 2020-07-25 NOTE — Telephone Encounter (Signed)
Requested medication (s) are due for refill today - yes  Requested medication (s) are on the active medication list -yes  Future visit scheduled -yes  Last refill: 12/09/20 1RF  Notes to clinic: Request RF- medication not assigned to protocol  Requested Prescriptions  Pending Prescriptions Disp Refills   mometasone (ELOCON) 0.1 % lotion [Pharmacy Med Name: MOMETASONE FUROATE 0.1% SOLN] 60 mL 1    Sig: APPLY 1 ML ONTO THE SKIN DAILY FOR PSORIASIS      Off-Protocol Failed - 07/25/2020 12:01 PM      Failed - Medication not assigned to a protocol, review manually.      Passed - Valid encounter within last 12 months    Recent Outpatient Visits           Yesterday Lateral epicondylitis of right elbow   [facility] [place], Dionne Bucy, MD   4 months ago Encounter for annual physical exam   [facility], Dionne Bucy, MD   4 months ago Fever, unspecified fever cause   [facility], Dionne Bucy, MD   6 months ago Essential hypertension   [place], Dionne Bucy, MD   8 months ago Pre-op evaluation   [facility] [place], Dionne Bucy, MD       Future Appointments             In 1 month Parrett, Fonnie Mu, NP [facility]   In 1 month Bacigal[place], Dionne Bucy, MD [facility]   In 3 months Billey Co, MD [facility]                 Requested Prescriptions  Pending Prescriptions Disp Refills   mometasone (ELOCON) 0.1 % lotion [Pharmacy Med Name: MOMETASONE FUROATE 0.1% SOLN] 60 mL 1    Sig: APPLY 1 ML ONTO THE SKIN DAILY FOR PSORIASIS      Off-Protocol Failed - 07/25/2020 12:01 PM      Failed - Medication not assigned to a protocol, review manually.      Passed - Valid encounter within last 12 months    Recent Outpatient Visits           Yesterday Lateral epicondylitis of right elbow   [facility]  [place], Dionne Bucy, MD   4 months ago Encounter for annual physical exam   [facility] [place], Dionne Bucy, MD   4 months ago Fever, unspecified fever cause   [facility], Dionne Bucy, MD   6 months ago Essential hypertension   [place], Dionne Bucy, MD   8 months ago Pre-op evaluation   [place], Dionne Bucy, MD       Future Appointments             In 1 month Parrett, Fonnie Mu, NP [facility]   In 1 month Bacigalupo, Dionne Bucy, MD [facility] [place]   In 3 months [facility] [place], Herbert Seta, [facility]

## 2020-07-30 ENCOUNTER — Encounter: Payer: Medicaid Other | Admitting: Pain Medicine

## 2020-08-05 ENCOUNTER — Encounter: Payer: Self-pay | Admitting: Pain Medicine

## 2020-08-05 NOTE — Progress Notes (Signed)
Patient: Caleb [name]  Service Category: E/M  Provider: Gaspar Cola, MD  DOB: 1955/11/17  DOS: 08/06/2020  Location: Office  MRN: [id number]  Setting: Ambulatory outpatient  Referring Provider: Virginia Crews, MD  Type: Established Patient  Specialty: Interventional Pain Management  PCP: Virginia Crews, MD  Location: Remote location  Delivery: TeleHealth     Virtual Encounter - Pain Management PROVIDER NOTE: Information contained herein reflects review and annotations entered in association with encounter. Interpretation of such information and data should be left to medically-trained personnel. Information provided to patient can be located elsewhere in the medical record under "Patient Instructions". Document created using STT-dictation technology, any transcriptional errors that may result from process are unintentional.    Contact & Pharmacy Preferred: [phone] Home: [phone] (home) Mobile: [phone] (mobile) E-mail: [email or web address]  [id number], [place] - [place] [address] [place] [address] Phone: [phone] Fax: [phone]  [place] [id number] [place], [place] - [place] [address] Phone: [phone] Fax: [phone]   Pre-screening  Mr. Fredell offered "in-person" vs "virtual" encounter. He indicated preferring virtual for this encounter.   Reason COVID-19*  Social distancing based on CDC and AMA recommendations.   I contacted Caleb [name] on 08/06/2020 via telephone.      I clearly identified myself as Gaspar Cola, MD. I verified that I was speaking with the correct person using two identifiers (Name: JAYDON AVINA, and date of birth: 01-03-1956).  Consent I sought verbal advanced consent from Caleb [name] for virtual visit interactions. I informed Mr. Serviss of possible security and privacy concerns, risks, and limitations associated  with providing "not-in-person" medical evaluation and management services. I also informed Mr. Kirby of the availability of "in-person" appointments. Finally, I informed him that there would be a charge for the virtual visit and that he could be  personally, fully or partially, financially responsible for it. Mr. Wrede expressed understanding and agreed to proceed.   Historic Elements   Mr. NORWOOD QUEZADA is a 65 y.o. year old, male patient evaluated today after our last contact on 06/19/2020. Mr. Turay  has a past medical history of Chronic foot pain, right (2015), COPD (chronic obstructive pulmonary disease) ([place]), Coronary artery disease, Cough syncope, Emphysema lung ([place]), Family history of adverse reaction to anesthesia, History of kidney stones, Hyperlipidemia, Hypertension, Kidney stone, Leucocytosis, Myocardial infarction ([place]) (2015), and OSA on CPAP. He also  has a past surgical history that includes Lumbar laminectomy (1989, 1999); Cervical fusion (1988, 1998); Liver surgery (2015); Partial colectomy (1990); Inguinal hernia repair (Bilateral, 1975); Lithotripsy; Colonoscopy with propofol (N/A, 02/24/2017); Cardiac catheterization (2015); Arthrodesis metatarsal (Right, 11/26/2019); and Lesion removal (Right, 11/26/2019). Mr. Gasaway has a current medication list which includes the following prescription(s): albuterol, anoro ellipta, aspirin ec, cetirizine, diazepam, fluocinolone, fluticasone, hydrochlorothiazide, hydrocodone-acetaminophen, ibuprofen, isosorbide mononitrate, losartan, melatonin, methocarbamol, mometasone, montelukast, naloxone, nitroglycerin, pregabalin, rosuvastatin, turmeric, varenicline, and hydrocodone-acetaminophen. He  reports that he has been smoking cigarettes. He has a 25.50 pack-year smoking history. He has quit using smokeless tobacco. He reports current alcohol use of about 4.0 standard drinks of alcohol per week. He reports that he does not use drugs. Mr. Twaddle is  allergic to lisinopril and oxycodone.   HPI  Today, he is being contacted for worsening of previously known (established) problem.  Apparently the patient has been experiencing more right foot and ankle pain.  He refers that he was unable  to sleep last night due to the pain.  In the past we have been treating this with palliative right-sided superficial peroneal/fibular nerve blocks with steroids, the latter which we did for him on 09/13/2019, with documented evidence of 100% relief of the pain for the duration of the local anesthetics followed by a 90 to 100% improvement of the right foot and ankle pain, improvement in range of motion and function, all of which seem to last close to 10 months.  This allows him to again be more functional and be able to complete his activities of daily living which at this point he refers are impaired because as his current flareup of the pain.  The plan at this point is to repeat this procedure which we have proven to be extremely effective in controlling the symptoms from this chronic condition.  Pharmacotherapy Assessment   Analgesic: Hydrocodone/APAP 5/325 1 tablet every 6 hours (20 mg/day of hydrocodone).  (Unable to tolerate an oxycodone IR trial due to stomachaches, nausea, headaches, and excessive somnolence.  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   Monitoring: [place] PMP: PDMP reviewed during this encounter.       Pharmacotherapy: No side-effects or adverse reactions reported. Compliance: No problems identified. Effectiveness: Clinically acceptable. Plan: Refer to "POC". UDS:  Summary  Date Value Ref Range Status  09/11/2019 Note  Final    Comment:    ==================================================================== [employer] Select 13 (MW) ==================================================================== Test                             Result       Flag       Units  Drug Present and Declared for Prescription  Verification   Hydrocodone                    1156         EXPECTED   ng/mg creat   Hydromorphone                  500          EXPECTED   ng/mg creat   Dihydrocodeine                 90           EXPECTED   ng/mg creat   Norhydrocodone                 1409         EXPECTED   ng/mg creat    Sources of hydrocodone include scheduled prescription medications.    Hydromorphone, dihydrocodeine and norhydrocodone are expected    metabolites of hydrocodone. Hydromorphone and dihydrocodeine are    also available as scheduled prescription medications.  Drug Present not Declared for Prescription Verification   Tramadol                       370          UNEXPECTED ng/mg creat   O-Desmethyltramadol            545          UNEXPECTED ng/mg creat   N-Desmethyltramadol            48           UNEXPECTED ng/mg creat    Source of tramadol is a prescription medication. O-desmethyltramadol    and N-desmethyltramadol are expected metabolites  of tramadol.  ==================================================================== Test                      Result    Flag   Units      Ref Range   Creatinine              176              mg/dL      >=38 ==================================================================== Declared Medications:  The flagging and interpretation on this report are based on the  following declared medications.  Unexpected results may arise from  inaccuracies in the declared medications.   **Note: The testing scope of this panel includes these medications:   Hydrocodone (Norco)   **Note: The testing scope of this panel does not include the  following reported medications:   Acetaminophen (Norco)  Aspirin  Cetirizine (Zyrtec)  Doxycycline  Fluticasone (Flonase)  Hydrochlorothiazide  Hydrocortisone  Isosorbide (Imdur)  Losartan (Cozaar)  Methocarbamol (Robaxin)  Mometasone  Naloxone (Narcan)  Neomycin  Nitroglycerin (Nitrostat)  Polymyxin B (Polymyxin)  Pregabalin  (Lyrica)  Rosuvastatin (Crestor)  Supplement  Tadalafil (Cialis)  Topical  Topical Lidocaine (Lidoderm)  Umeclidinium (Anoro)  Vilanterol (Anoro) ==================================================================== For clinical consultation, please call [phone]. ====================================================================      Laboratory Chemistry Profile   Renal Lab Results  Component Value Date   BUN 16 02/23/2020   CREATININE 0.73 02/23/2020   BCR 18 09/20/2019   GFRAA 112 09/20/2019   GFRNONAA >60 02/23/2020    Hepatic Lab Results  Component Value Date   AST 15 09/20/2019   ALT 14 09/20/2019   ALBUMIN 4.1 09/20/2019   ALKPHOS 67 09/20/2019    Electrolytes Lab Results  Component Value Date   NA 131 (L) 02/23/2020   K 3.6 02/23/2020   CL 95 (L) 02/23/2020   CALCIUM 9.1 02/23/2020   MG 2.0 05/30/2018    Bone Lab Results  Component Value Date   25OHVITD1 32 02/17/2017   25OHVITD2 <1.0 02/17/2017   25OHVITD3 32 02/17/2017   TESTOFREE 5.2 (L) 02/28/2017   TESTOSTERONE 279 02/28/2017    Inflammation (CRP: Acute Phase) (ESR: Chronic Phase) Lab Results  Component Value Date   CRP 8 11/07/2017   ESRSEDRATE 26 11/07/2017         Note: Above Lab results reviewed.  Imaging  DG PAIN CLINIC C-ARM 1-60 MIN NO REPORT Fluoro was used, but no Radiologist interpretation will be provided.  Please refer to "NOTES" tab for provider progress note.  Assessment  The primary encounter diagnosis was Chronic foot pain (1ry area of Pain) (Right). 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  Plan of Care  Problem-specific:  No problem-specific Assessment & Plan notes found for this encounter.  Mr. KUNIO CUMMISKEY has a current medication list which  includes the following long-term medication(s): albuterol, cetirizine, fluticasone, hydrochlorothiazide, hydrocodone-acetaminophen, isosorbide mononitrate, losartan, methocarbamol, montelukast, nitroglycerin, pregabalin, rosuvastatin, and hydrocodone-acetaminophen.  Pharmacotherapy (Medications Ordered): Meds ordered this encounter  Medications   diazepam (VALIUM) 10 MG tablet    Sig: Take 1 tablet (10 mg total) by mouth 60 (sixty) minutes before procedure for 1 dose. Take with a sip of water, on an empty stomach. Do not eat anything for 6 hours prior to procedure.    Dispense:  1 tablet  Refill:  0    Orders:  Orders Placed This Encounter  Procedures   Misc procedure    Type of Block:  Superficial Peroneal (Fibular) Nerve Block Side: Right-sided    Standing Status:   Future    Standing Expiration Date:   10/06/2020    Scheduling Instructions:     Sedation: With Sedation.     Timeframe: ASAA     Blocked time: 20 min   Informed Consent Details: Physician/Practitioner Attestation; Transcribe to consent form and obtain patient signature    Nursing Order: Transcribe to consent form and obtain patient signature. Note: Always confirm laterality of pain with Mr. Friesen, before procedure.    Order Specific Question:   Physician/Practitioner attestation of informed consent for procedure/surgical case    Answer:   I, the physician/practitioner, attest that I have discussed with the patient the benefits, risks, side effects, alternatives, likelihood of achieving goals and potential problems during recovery for the procedure that I have provided informed consent.    Order Specific Question:   Procedure    Answer:   Peroneal (Fibular) Nerve Block    Order Specific Question:   Physician/Practitioner performing the procedure    Answer:   Yahshua Thibault A. Dossie Arbour, MD    Order Specific Question:   Indication/Reason    Answer:   Intractable chronic distal lower extremity pain    Follow-up plan:    Return for Procedure (Oral sedation): (R) superficial peroneal nerve block.     Interventional Therapies  Risk  Complexity Considerations:   Estimated body mass index is 30.04 kg/m as calculated from the following:   Height as of 06/20/20: $RemoveBef'5\' 8"'FoIAeAKhJk$  (1.727 m).   Weight as of 06/20/20: 197 lb 9.6 oz (89.6 kg). WNL   Planned  Pending:   Diagnostic bilateral lumbar facet MBB #2    Under consideration:   Possible spinal cord stimulator trial  Possible bilateral cervical facet RFA    Completed:   Therapeutic/palliative left CESI x8 (06/19/2020) (6-0) (100/100/80/80)  Diagnostic bilateral cervical facet MBB x2 (09/22/2017) (5-1) (100/100/100/>50)  Diagnostic bilateral lumbar facet MBB x1 (01/29/2020) (3-0) (100/100/90/90)  Diagnostic left SI joint block x1 (06/19/2019) (8-0) (100/100/100 x3 days/0)  Therapeutic midline serratus posterior TPI/MNB x1 (01/11/2019)  Diagnostic right Common Peroneal NB (C-PNB) x2 (04/14/2017) (5-0) (100/100/20/<25)  Therapeutic right common peroneal nerve (C-PN) RFA x1 (06/21/2017) (3-0) (100/100/100 x 3 days/75-100)  Diagnostic right superficial peroneal (S-PN) NB x4 (09/13/2019) (7-3) (100/100/100/90-100) Therapeutic right superficial peroneal nerve (S-PN) RFA x1 (10/25/2017) (4-0) (100/100/100 x1 day/25) Therapeutic right deep peroneal NB (D-PNB) x1 (05/03/2019)    Therapeutic  Palliative (PRN) options:   Palliative left CESI  Diagnostic bilateral cervical facet block #3  Palliative right superficial peroneal NB #5  Palliative right superficial peroneal nerve RFA #2      Recent Visits Date Type Provider Dept  07/02/20 Telemedicine Milinda Pointer, MD [facility]  06/19/20 Procedure visit Milinda Pointer, MD [facility]  06/04/20 Telemedicine Milinda Pointer, MD [facility]  05/15/20 Procedure visit Milinda Pointer, MD [facility]  Showing recent visits within past 90 days and meeting all other  requirements Today's Visits Date Type Provider Dept  08/06/20 Telemedicine Milinda Pointer, MD [facility]  Showing today's visits and meeting all other requirements Future Appointments Date Type Provider Dept  08/27/20 Appointment Milinda Pointer, MD [facility]  Showing future appointments within next 90 days and meeting all other requirements I discussed the assessment and treatment plan with the  patient. The patient was provided an opportunity to ask questions and all were answered. The patient agreed with the plan and demonstrated an understanding of the instructions.  Patient advised to call back or seek an in-person evaluation if the symptoms or condition worsens.  Duration of encounter: 15 minutes.  Note by: Gaspar Cola, MD Date: 08/06/2020; Time: 2:06 PM

## 2020-08-06 ENCOUNTER — Other Ambulatory Visit: Payer: Self-pay

## 2020-08-06 ENCOUNTER — Ambulatory Visit: Payer: Medicaid Other | Attending: Pain Medicine | Admitting: Pain Medicine

## 2020-08-06 DIAGNOSIS — M79671 Pain in right foot: Secondary | ICD-10-CM

## 2020-08-06 DIAGNOSIS — G5731 Lesion of lateral popliteal nerve, right lower limb: Secondary | ICD-10-CM

## 2020-08-06 DIAGNOSIS — S8411XS Injury of peroneal nerve at lower leg level, right leg, sequela: Secondary | ICD-10-CM

## 2020-08-06 DIAGNOSIS — M25571 Pain in right ankle and joints of right foot: Secondary | ICD-10-CM

## 2020-08-06 DIAGNOSIS — G8929 Other chronic pain: Secondary | ICD-10-CM | POA: Diagnosis not present

## 2020-08-06 DIAGNOSIS — F419 Anxiety disorder, unspecified: Secondary | ICD-10-CM

## 2020-08-06 DIAGNOSIS — M792 Neuralgia and neuritis, unspecified: Secondary | ICD-10-CM

## 2020-08-06 MED ORDER — DIAZEPAM 10 MG PO TABS: 10.0000 mg | ORAL_TABLET | ORAL | 0 refills | Status: DC

## 2020-08-06 NOTE — Patient Instructions (Signed)
______________________________________________________________________  Preparing for Procedure with Oral Anxiolytics  Definition: Anxiolytics - Medications that provide muscle relaxation and decrease anxiety.  Procedure appointments are limited to planned procedures: No Prescription Refills. No disability issues will be discussed. No medication changes will be discussed.  Instructions: Oral Intake: Do not eat or drink anything for at least 6 hours prior to your procedure. (Exception: Blood Pressure Medication. See below.)  Anxiolytic Medication: This medication is meant to relax you during your procedure. DO NOT DRIVE OR OPERATE DANGEROUS MACHINERY WHILE UNDER THE INFLUENCE OF THIS MEDICATION. Take the oral medication (i.e.: Valium) as prescribed on the day of your procedure with just a sip of water. Prescription will be sent to your pharmacy, prior to the day of your procedure. Topical anesthetic: Your physician might have recommended a topical anesthetic/analgesic to apply over the area where the procedure will be done. If so, apply to area 1 hour prior to procedure. For exact location of application, ask your healthcare provider.    Transportation: A driver is required. You may not drive yourself after the procedure. Blood Pressure Medicine: Do not forget to take your blood pressure medicine with a sip of water the morning of the procedure. If your Diastolic (lower reading) is above 100 mmHg, elective cases will be cancelled/rescheduled. Blood thinners: These will need to be stopped for procedures. Notify our staff if you are taking any blood thinners. Depending on which one you take, there will be specific instructions on how and when to stop it. Diabetics on insulin: Notify the staff so that you can be scheduled 1st case in the morning. If your diabetes requires high dose insulin, take only  of your normal insulin dose the morning of the procedure and notify the staff that you have done  so. Preventing infections: Shower with an antibacterial soap the morning of your procedure. Build-up your immune system: Take 1000 mg of Vitamin C with every meal (3 times a day) the day prior to your procedure. Antibiotics: Inform the staff if you have a condition or reason that requires you to take antibiotics before dental procedures. Pregnancy: If you are pregnant, call and cancel the procedure. Sickness: If you have a cold, fever, or any active infections, call and cancel the procedure. Arrival: You must be in the facility at least 30 minutes prior to your scheduled procedure. Children: Do not bring children with you. Dress appropriately: Bring dark clothing that you would not mind if they get stained. Valuables: Do not bring any jewelry or valuables.  Reasons to call and reschedule or cancel your procedure:  NOTE: Following these recommendations will minimize the risk of a serious complication. Surgeries: Avoid having procedures within 2 weeks of any surgery. (Avoid for 2 weeks before or after any surgery). Flu Shots: Avoid having procedures within 2 weeks of a flu shots. (Avoid for 2 weeks before or after immunizations). Barium: Avoid having a procedure within 7-10 days after having had a radiological study involving the use of radiological contrast. (Myelograms, Barium swallow or enema study). Heart attacks: Avoid any elective procedures or surgeries for the initial 6 months after a "Myocardial Infarction" (Heart Attack). Blood thinners: It is imperative that you stop these medications before procedures. Let us know if you if you take any blood thinner.  Infection: Avoid procedures during or within two weeks of an infection (including chest colds or gastrointestinal problems). Symptoms associated with infections include: Localized redness, fever, chills, night sweats or profuse sweating, burning sensation when voiding, cough, congestion,   stuffiness, runny nose, sore throat, diarrhea,  nausea, vomiting, cold or Flu symptoms, recent or current infections. It is especially important if the infection is over the area that we intend to treat. Heart and lung problems: Symptoms that may suggest an active cardiopulmonary problem include: cough, chest pain, breathing difficulties or shortness of breath, dizziness, ankle swelling, uncontrolled high or unusually low blood pressure, and/or palpitations. If you are experiencing any of these symptoms, cancel your procedure and contact your primary care physician for an evaluation.  Remember:  Regular Business hours are:  Monday to Thursday 8:00 AM to 4:00 PM  Provider's Schedule: Fotios Amos, MD:  Procedure days: Tuesday and Thursday 7:30 AM to 4:00 PM  Bilal Lateef, MD:  Procedure days: Monday and Wednesday 7:30 AM to 4:00 PM ______________________________________________________________________  ____________________________________________________________________________________________  General Risks and Possible Complications  Patient Responsibilities: It is important that you read this as it is part of your informed consent. It is our duty to inform you of the risks and possible complications associated with treatments offered to you. It is your responsibility as a patient to read this and to ask questions about anything that is not clear or that you believe was not covered in this document.  Patient's Rights: You have the right to refuse treatment. You also have the right to change your mind, even after initially having agreed to have the treatment done. However, under this last option, if you wait until the last second to change your mind, you may be charged for the materials used up to that point.  Introduction: Medicine is not an exact science. Everything in Medicine, including the lack of treatment(s), carries the potential for danger, harm, or loss (which is by definition: Risk). In Medicine, a complication is a secondary  problem, condition, or disease that can aggravate an already existing one. All treatments carry the risk of possible complications. The fact that a side effects or complications occurs, does not imply that the treatment was conducted incorrectly. It must be clearly understood that these can happen even when everything is done following the highest safety standards.  No treatment: You can choose not to proceed with the proposed treatment alternative. The "PRO(s)" would include: avoiding the risk of complications associated with the therapy. The "CON(s)" would include: not getting any of the treatment benefits. These benefits fall under one of three categories: diagnostic; therapeutic; and/or palliative. Diagnostic benefits include: getting information which can ultimately lead to improvement of the disease or symptom(s). Therapeutic benefits are those associated with the successful treatment of the disease. Finally, palliative benefits are those related to the decrease of the primary symptoms, without necessarily curing the condition (example: decreasing the pain from a flare-up of a chronic condition, such as incurable terminal cancer).  General Risks and Complications: These are associated to most interventional treatments. They can occur alone, or in combination. They fall under one of the following six (6) categories: no benefit or worsening of symptoms; bleeding; infection; nerve damage; allergic reactions; and/or death. No benefits or worsening of symptoms: In Medicine there are no guarantees, only probabilities. No healthcare provider can ever guarantee that a medical treatment will work, they can only state the probability that it may. Furthermore, there is always the possibility that the condition may worsen, either directly, or indirectly, as a consequence of the treatment. Bleeding: This is more common if the patient is taking a blood thinner, either prescription or over the counter (example: Goody  Powders, Fish oil, Aspirin, Garlic, etc.),   or if suffering a condition associated with impaired coagulation (example: Hemophilia, cirrhosis of the liver, low platelet counts, etc.). However, even if you do not have one on these, it can still happen. If you have any of these conditions, or take one of these drugs, make sure to notify your treating physician. Infection: This is more common in patients with a compromised immune system, either due to disease (example: diabetes, cancer, human immunodeficiency virus [HIV], etc.), or due to medications or treatments (example: therapies used to treat cancer and rheumatological diseases). However, even if you do not have one on these, it can still happen. If you have any of these conditions, or take one of these drugs, make sure to notify your treating physician. Nerve Damage: This is more common when the treatment is an invasive one, but it can also happen with the use of medications, such as those used in the treatment of cancer. The damage can occur to small secondary nerves, or to large primary ones, such as those in the spinal cord and brain. This damage may be temporary or permanent and it may lead to impairments that can range from temporary numbness to permanent paralysis and/or brain death. Allergic Reactions: Any time a substance or material comes in contact with our body, there is the possibility of an allergic reaction. These can range from a mild skin rash (contact dermatitis) to a severe systemic reaction (anaphylactic reaction), which can result in death. Death: In general, any medical intervention can result in death, most of the time due to an unforeseen complication. ____________________________________________________________________________________________  

## 2020-08-17 NOTE — Progress Notes (Signed)
PROVIDER NOTE: Information contained herein reflects review and annotations entered in association with encounter. Interpretation of such information and data should be left to medically-trained personnel. Information provided to patient can be located elsewhere in the medical record under "Patient Instructions". Document created using STT-dictation technology, any transcriptional errors that may result from process are unintentional.    Patient: Caleb [name]  Service Category: Procedure  Provider: Gaspar Cola, MD  DOB: 1955-09-15  DOS: 08/19/2020  Location: [facility]  MRN: [id number]  Setting: Ambulatory - outpatient  Referring Provider: Virginia Crews, MD  Type: Established Patient  Specialty: Interventional Pain Management  PCP: Virginia Crews, MD   Primary Reason for Visit: Interventional Pain Management Treatment. CC: Foot Pain and Back Pain  Procedure:          Anesthesia, Analgesia, Anxiolysis:  Type: Palliative Superficial Peroneal/Fibular (S-PN)           Nerve Block          Region:                        Level: Distal lower extremity. Laterality: Right-Side  Type: Local Anesthesia Indication(s): Analgesia         Local Anesthetic: Lidocaine 1-2% Route: Infiltration (/IM) IV Access: Declined Sedation: Declined   Position: Supine   Indications: 1. Chronic ankle pain (2ry area of Pain) (Right)   2. Disorder of superficial peroneal nerve (Right)   3. 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 Neurogenic foot pain (Right)   5. Neuropathy of right foot    Pain Score: Pre-procedure: 8 /10 Post-procedure: 8 /10   Pre-op H&P Assessment:  Mr. Ivanova is a 65 y.o. (year old), male patient, seen today for interventional treatment. He  has a past surgical history that includes Lumbar laminectomy (1989, 1999); Cervical fusion (1988, 1998); Liver surgery (2015); Partial colectomy (1990); Inguinal hernia repair (Bilateral, 1975);  Lithotripsy; Colonoscopy with propofol (N/A, 02/24/2017); Cardiac catheterization (2015); Arthrodesis metatarsal (Right, 11/26/2019); and Lesion removal (Right, 11/26/2019). Mr. Weeber has a current medication list which includes the following prescription(s): albuterol, anoro ellipta, aspirin ec, cetirizine, diazepam, fluocinolone, fluticasone, hydrochlorothiazide, hydrocodone-acetaminophen, ibuprofen, isosorbide mononitrate, losartan, melatonin, methocarbamol, mometasone, montelukast, naloxone, nitroglycerin, pregabalin, rosuvastatin, turmeric, varenicline, and hydrocodone-acetaminophen. His primarily concern today is the Foot Pain and Back Pain  Initial Vital Signs:  Pulse/HCG Rate: 77ECG Heart Rate: 63 Temp: (!) 97.1 F (36.2 C) Resp: (!) 22 BP: 103/90 SpO2: 98 %  BMI: Estimated body mass index is 30.41 kg/m as calculated from the following:   Height as of this encounter: '5\' 8"'$  (1.727 m).   Weight as of this encounter: 200 lb (90.7 kg).  Risk Assessment: Allergies: Reviewed. He is allergic to lisinopril and oxycodone.  Allergy Precautions: None required Coagulopathies: Reviewed. None identified.  Blood-thinner therapy: None at this time Active Infection(s): Reviewed. None identified. Mr. Billips is afebrile  Site Confirmation: Mr. Schanbacher was asked to confirm the procedure and laterality before marking the site Procedure checklist: Completed Consent: Before the procedure and under the influence of no sedative(s), amnesic(s), or anxiolytics, the patient was informed of the treatment options, risks and possible complications. To fulfill our ethical and legal obligations, as recommended by the American Medical Association's Code of Ethics, I have informed the patient of my clinical impression; the nature and purpose of the treatment or procedure; the risks, benefits, and possible complications of the intervention; the alternatives, including doing nothing; the risk(s)  and benefit(s) of the  alternative treatment(s) or procedure(s); and the risk(s) and benefit(s) of doing nothing. The patient was provided information about the general risks and possible complications associated with the procedure. These may include, but are not limited to: failure to achieve desired goals, infection, bleeding, organ or nerve damage, allergic reactions, paralysis, and death. In addition, the patient was informed of those risks and complications associated to the procedure, such as failure to decrease pain; infection; bleeding; organ or nerve damage with subsequent damage to sensory, motor, and/or autonomic systems, resulting in permanent pain, numbness, and/or weakness of one or several areas of the body; allergic reactions; (i.e.: anaphylactic reaction); and/or death. Furthermore, the patient was informed of those risks and complications associated with the medications. These include, but are not limited to: allergic reactions (i.e.: anaphylactic or anaphylactoid reaction(s)); adrenal axis suppression; blood sugar elevation that in diabetics may result in ketoacidosis or comma; water retention that in patients with history of congestive heart failure may result in shortness of breath, pulmonary edema, and decompensation with resultant heart failure; weight gain; swelling or edema; medication-induced neural toxicity; particulate matter embolism and blood vessel occlusion with resultant organ, and/or nervous system infarction; and/or aseptic necrosis of one or more joints. Finally, the patient was informed that Medicine is not an exact science; therefore, there is also the possibility of unforeseen or unpredictable risks and/or possible complications that may result in a catastrophic outcome. The patient indicated having understood very clearly. We have given the patient no guarantees and we have made no promises. Enough time was given to the patient to ask questions, all of which were answered to the patient's  satisfaction. Mr. Nebergall has indicated that he wanted to continue with the procedure. Attestation: I, the ordering provider, attest that I have discussed with the patient the benefits, risks, side-effects, alternatives, likelihood of achieving goals, and potential problems during recovery for the procedure that I have provided informed consent. Date  Time: 08/19/2020  8:32 AM  Pre-Procedure Preparation:  Monitoring: As per clinic protocol. Respiration, ETCO2, SpO2, BP, heart rate and rhythm monitor placed and checked for adequate function Safety Precautions: Patient was assessed for positional comfort and pressure points before starting the procedure. Time-out: I initiated and conducted the "Time-out" before starting the procedure, as per protocol. The patient was asked to participate by confirming the accuracy of the "Time Out" information. Verification of the correct person, site, and procedure were performed and confirmed by me, the nursing staff, and the patient. "Time-out" conducted as per Joint Commission's Universal Protocol (UP.01.01.01). Time: 0901  Description of Procedure:          Target Area: For the superficial peroneal [fibular] nerve, the target area is the lateral aspect of the distal lower extremity, above the lateral malleolus, as the nerve exits the crural fascia and the lateral compartment of the leg. Approach: Percutaneous approach. Area Prepped: Entire distal anterolateral lower extremity region DuraPrep (Iodine Povacrylex [0.7% available iodine] and Isopropyl Alcohol, 74% w/w) Safety Precautions: Aspiration looking for blood return was conducted prior to all injections. At no point did we inject any substances, as a needle was being advanced. No attempts were made at seeking any paresthesias. Safe injection practices and needle disposal techniques used. Medications properly checked for expiration dates. SDV (single dose vial) medications used. Description of the Procedure:  Protocol guidelines were followed. The patient was placed in position. The target area was identified and the area prepped in the usual manner. Skin & deeper tissues  infiltrated with local anesthetic. Appropriate amount of time allowed to pass for local anesthetics to take effect. The procedure needles were then advanced to the target area. Proper needle placement secured. Negative aspiration confirmed. Solution injected in intermittent fashion, asking for systemic symptoms every 0.5cc of injectate. The needles were then removed and the area cleansed, making sure to leave some of the prepping solution back to take advantage of its long term bactericidal properties.         Vitals:   08/19/20 0830 08/19/20 0904  BP: 103/90 104/84  Pulse: 77   Resp:  (!) 22  Temp: (!) 97.1 F (36.2 C)   SpO2: 98% 96%  Weight: 200 lb (90.7 kg)   Height: '5\' 8"'$  (1.727 m)     Start Time: 0901 hrs. End Time:   hrs.  Materials:  Needle(s) Type: Spinal Needle Gauge: 22G Length: 3.5-in Medication(s): Please see orders for medications and dosing details.  Imaging Guidance (Non-Spinal):          Type of Imaging Technique: Fluoroscopy Guidance (Non-Spinal) Indication(s): Assistance in needle guidance and placement for procedures requiring needle placement in or near specific anatomical locations not easily accessible without such assistance. Exposure Time: Please see nurses notes. Contrast: None used. Fluoroscopic Guidance: I was personally present during the use of fluoroscopy. "Tunnel Vision Technique" used to obtain the best possible view of the target area. Parallax error corrected before commencing the procedure. "Direction-depth-direction" technique used to introduce the needle under continuous pulsed fluoroscopy. Once target was reached, antero-posterior, oblique, and lateral fluoroscopic projection used confirm needle placement in all planes. Images permanently stored in EMR. Interpretation: No contrast  injected. I personally interpreted the imaging intraoperatively. Adequate needle placement confirmed in multiple planes. Permanent images saved into the patient's record.  Antibiotic Prophylaxis:   Anti-infectives (From admission, onward)    None      Indication(s): None identified  Post-operative Assessment:  Post-procedure Vital Signs:  Pulse/HCG Rate: 7763 Temp: (!) 97.1 F (36.2 C) Resp: (!) 22 BP: 104/84 SpO2: 96 %  EBL: None  Complications: No immediate post-treatment complications observed by team, or reported by patient.  Note: The patient tolerated the entire procedure well. A repeat set of vitals were taken after the procedure and the patient was kept under observation following institutional policy, for this type of procedure. Post-procedural neurological assessment was performed, showing return to baseline, prior to discharge. The patient was provided with post-procedure discharge instructions, including a section on how to identify potential problems. Should any problems arise concerning this procedure, the patient was given instructions to immediately contact us, at any time, without hesitation. In any case, we plan to contact the patient by telephone for a follow-up status report regarding this interventional procedure.  Comments:  No additional relevant information.  Plan of Care  Orders:  Orders Placed This Encounter  Procedures   Misc procedure    Type of Block:  Superficial Peroneal (Fibular) Nerve Block Side: Right-sided    Scheduling Instructions:     Sedation: Patient's choice.     Timeframe: Today   DG PAIN [facility] C-ARM 1-60 MIN NO REPORT    Intraoperative interpretation by procedural physician at [place].    Standing Status:   Standing    Number of Occurrences:   1    Order Specific Question:   Reason for exam:    Answer:   Assistance in needle guidance and placement for procedures requiring needle placement in or near specific  anatomical locations  not easily accessible without such assistance.   Informed Consent Details: Physician/Practitioner Attestation; Transcribe to consent form and obtain patient signature    Nursing Order: Transcribe to consent form and obtain patient signature. Note: Always confirm laterality of pain with Mr. Thibodeaux, before procedure.    Order Specific Question:   Physician/Practitioner attestation of informed consent for procedure/surgical case    Answer:   I, the physician/practitioner, attest that I have discussed with the patient the benefits, risks, side effects, alternatives, likelihood of achieving goals and potential problems during recovery for the procedure that I have provided informed consent.    Order Specific Question:   Procedure    Answer:   Peroneal (Fibular) Nerve Block    Order Specific Question:   Physician/Practitioner performing the procedure    Answer:   Aarion Metzgar A. Dossie Arbour, MD    Order Specific Question:   Indication/Reason    Answer:   Intractable chronic distal lower extremity pain   Provide equipment / supplies at bedside    "Block Tray" (Disposable  single use) Needle type: SpinalRegular Amount/quantity: 1 Size: Short(1.5-inch) Gauge: 22G    Standing Status:   Standing    Number of Occurrences:   1    Order Specific Question:   Specify    Answer:   Block Tray    Chronic Opioid Analgesic:  Hydrocodone/APAP 5/325 1 tablet every 6 hours (20 mg/day of hydrocodone).  (Unable to tolerate an oxycodone IR trial due to stomachaches, nausea, headaches, and excessive somnolence.  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   Medications ordered for procedure: Meds ordered this encounter  Medications   lidocaine (XYLOCAINE) 2 % (with pres) injection 400 mg   ropivacaine (PF) 2 mg/mL (0.2%) (NAROPIN) injection 3 mL   triamcinolone acetonide (KENALOG-40) injection 40 mg   dexamethasone (DECADRON) injection 10 mg    Medications  administered: We administered lidocaine, ropivacaine (PF) 2 mg/mL (0.2%), triamcinolone acetonide, and dexamethasone.  See the medical record for exact dosing, route, and time of administration.  Follow-up plan:   Return in 8 days (on 08/27/2020) for (M,W) (F2F) (MM), (PPE).       Interventional Therapies  Risk  Complexity Considerations:   Estimated body mass index is 30.04 kg/m as calculated from the following:   Height as of 06/20/20: '5\' 8"'$  (1.727 m).   Weight as of 06/20/20: 197 lb 9.6 oz (89.6 kg). WNL   Planned  Pending:   Diagnostic bilateral lumbar facet MBB #2    Under consideration:   Possible spinal cord stimulator trial  Possible bilateral cervical facet RFA    Completed:   Therapeutic/palliative left CESI x8 (06/19/2020) (6-0) (100/100/80/80)  Diagnostic bilateral cervical facet MBB x2 (09/22/2017) (5-1) (100/100/100/>50)  Diagnostic bilateral lumbar facet MBB x1 (01/29/2020) (3-0) (100/100/90/90)  Diagnostic left SI joint block x1 (06/19/2019) (8-0) (100/100/100 x3 days/0)  Therapeutic midline serratus posterior TPI/MNB x1 (01/11/2019)  Diagnostic right Common Peroneal NB (C-PNB) x2 (04/14/2017) (5-0) (100/100/20/<25)  Therapeutic right common peroneal nerve (C-PN) RFA x1 (06/21/2017) (3-0) (100/100/100 x 3 days/75-100)  Diagnostic right superficial peroneal (S-PN) NB x4 (09/13/2019) (7-3) (100/100/100/90-100) Therapeutic right superficial peroneal nerve (S-PN) RFA x1 (10/25/2017) (4-0) (100/100/100 x1 day/25) Therapeutic right deep peroneal NB (D-PNB) x1 (05/03/2019)    Therapeutic  Palliative (PRN) options:   Palliative left CESI  Diagnostic bilateral cervical facet block #3  Palliative right superficial peroneal NB #5  Palliative right superficial peroneal nerve RFA #2       Recent  Visits Date Type Provider Dept  08/06/20 Telemedicine Milinda Pointer, MD [facility]  07/02/20 Telemedicine Milinda Pointer, MD [facility]  06/19/20 Procedure  visit Milinda Pointer, MD [facility]  06/04/20 Telemedicine Milinda Pointer, MD [facility]  Showing recent visits within past 90 days and meeting all other requirements Today's Visits Date Type Provider Dept  08/19/20 Procedure visit Milinda Pointer, MD [facility]  Showing today's visits and meeting all other requirements Future Appointments Date Type Provider Dept  08/27/20 Appointment Milinda Pointer, MD [facility]  Showing future appointments within next 90 days and meeting all other requirements Disposition: Discharge home  Discharge (Date  Time): 08/19/2020;   hrs.   Primary Care Physician: Virginia Crews, MD Location: [facility] Note by: Gaspar Cola, MD Date: 08/19/2020; Time: 9:26 AM  Disclaimer:  Medicine is not an Chief Strategy Officer. The only guarantee in medicine is that nothing is guaranteed. It is important to note that the decision to proceed with this intervention was based on the information collected from the patient. The Data and conclusions were drawn from the patient's questionnaire, the interview, and the physical examination. Because the information was provided in large part by the patient, it cannot be guaranteed that it has not been purposely or unconsciously manipulated. Every effort has been made to obtain as much relevant data as possible for this evaluation. It is important to note that the conclusions that lead to this procedure are derived in large part from the available data. Always take into account that the treatment will also be dependent on availability of resources and existing treatment guidelines, considered by other Pain Management Practitioners as being common knowledge and practice, at the time of the intervention. For Medico-Legal purposes, it is also important to point out that variation in procedural techniques and pharmacological choices are the acceptable  norm. The indications, contraindications, technique, and results of the above procedure should only be interpreted and judged by a Board-Certified Interventional Pain Specialist with extensive familiarity and expertise in the same exact procedure and technique.

## 2020-08-19 ENCOUNTER — Ambulatory Visit
Admission: RE | Admit: 2020-08-19 | Discharge: 2020-08-19 | Disposition: A | Discharge status: Home / Self Care | Payer: Medicaid Other | Source: Ambulatory Visit | Attending: Pain Medicine | Admitting: Pain Medicine

## 2020-08-19 ENCOUNTER — Ambulatory Visit (HOSPITAL_BASED_OUTPATIENT_CLINIC_OR_DEPARTMENT_OTHER): Payer: Medicaid Other | Admitting: Pain Medicine

## 2020-08-19 ENCOUNTER — Encounter: Payer: Self-pay | Admitting: Pain Medicine

## 2020-08-19 ENCOUNTER — Other Ambulatory Visit: Payer: Self-pay

## 2020-08-19 VITALS — BP 104/84 | HR 77 | Temp 97.1°F | Resp 22 | Ht 68.0 in | Wt 200.0 lb

## 2020-08-19 DIAGNOSIS — G8929 Other chronic pain: Secondary | ICD-10-CM | POA: Diagnosis not present

## 2020-08-19 DIAGNOSIS — G5791 Unspecified mononeuropathy of right lower limb: Secondary | ICD-10-CM | POA: Insufficient documentation

## 2020-08-19 DIAGNOSIS — M792 Neuralgia and neuritis, unspecified: Secondary | ICD-10-CM | POA: Diagnosis not present

## 2020-08-19 DIAGNOSIS — M25571 Pain in right ankle and joints of right foot: Secondary | ICD-10-CM | POA: Insufficient documentation

## 2020-08-19 DIAGNOSIS — G5731 Lesion of lateral popliteal nerve, right lower limb: Secondary | ICD-10-CM | POA: Insufficient documentation

## 2020-08-19 DIAGNOSIS — S8411XS Injury of peroneal nerve at lower leg level, right leg, sequela: Secondary | ICD-10-CM | POA: Insufficient documentation

## 2020-08-19 MED ORDER — TRIAMCINOLONE ACETONIDE 40 MG/ML IJ SUSP
40.0000 mg | Freq: Once | INTRAMUSCULAR | Status: AC
  Administered 2020-08-19: 40 mg
  Filled 2020-08-19: qty 1

## 2020-08-19 MED ORDER — LIDOCAINE HCL 2 % IJ SOLN
20.0000 mL | Freq: Once | INTRAMUSCULAR | Status: AC
  Administered 2020-08-19: 100 mg

## 2020-08-19 MED ORDER — ROPIVACAINE HCL 2 MG/ML IJ SOLN
3.0000 mL | Freq: Once | INTRAMUSCULAR | Status: AC
  Administered 2020-08-19: 3 mL via INTRA_ARTICULAR

## 2020-08-19 MED ORDER — DEXAMETHASONE SODIUM PHOSPHATE 10 MG/ML IJ SOLN
10.0000 mg | Freq: Once | INTRAMUSCULAR | Status: AC
  Administered 2020-08-19: 10 mg
  Filled 2020-08-19: qty 1

## 2020-08-19 NOTE — Patient Instructions (Addendum)

## 2020-08-19 NOTE — Progress Notes (Signed)
Nursing Pain Medication Assessment:  Safety precautions to be maintained throughout the outpatient stay will include: orient to surroundings, keep bed in low position, maintain call bell within reach at all times, provide assistance with transfer out of bed and ambulation.  Medication Inspection Compliance: Pill count conducted under aseptic conditions, in front of the patient. Neither the pills nor the bottle was removed from the patient's sight at any time. Once count was completed pills were immediately returned to the patient in their original bottle.  Medication: Hydrocodone/APAP Pill/Patch Count:  43 of 120 pills remain Pill/Patch Appearance: Markings consistent with prescribed medication Bottle Appearance: Standard pharmacy container. Clearly labeled. Filled Date: 7 / 27 / 2022 Last Medication intake:  Today Safety precautions to be maintained throughout the outpatient stay will include: orient to surroundings, keep bed in low position, maintain call bell within reach at all times, provide assistance with transfer out of bed and ambulation.

## 2020-08-20 DIAGNOSIS — M7711 Lateral epicondylitis, right elbow: Secondary | ICD-10-CM | POA: Insufficient documentation

## 2020-08-20 NOTE — Assessment & Plan Note (Signed)
Began smoking again after running out of Chantix Would like to resume Chantix as it worked well Will refill

## 2020-08-20 NOTE — Assessment & Plan Note (Signed)
Still followed by pain management  Refilled robaxin

## 2020-08-20 NOTE — Assessment & Plan Note (Signed)
Discussed RICE, HEP Return precautions discussed

## 2020-08-26 NOTE — Progress Notes (Signed)
PROVIDER NOTE: Information contained herein reflects review and annotations entered in association with encounter. Interpretation of such information and data should be left to medically-trained personnel. Information provided to patient can be located elsewhere in the medical record under "Patient Instructions". Document created using STT-dictation technology, any transcriptional errors that may result from process are unintentional.    Patient: Caleb [name]  Service Category: E/M  Provider: Oswaldo Done, MD  DOB: 12-14-1955  DOS: 08/27/2020  Specialty: Interventional Pain Management  MRN: [id number]  Setting: Ambulatory outpatient  PCP: Erasmo Downer, MD  Type: Established Patient    Referring Provider: Erasmo Downer, MD  Location: Office  Delivery: Face-to-face     HPI  [name], a 65 y.o. year old male, is here today because of his Chronic pain syndrome [G89.4]. [name] primary complain today is Foot Pain (right) Last encounter: My last encounter with him was on 08/19/2020. Pertinent problems: 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); Cervicalgia; DDD (degenerative disc disease), thoracic; Disorder of superficial peroneal nerve (Right); Chronic upper extremity pain (Left); Cervical spondylosis w/ radiculopathy; Cervical disc disorder with radiculopathy of cervical region; Chronic upper extremity weakness (Left); Abnormal nerve conduction studies (06/08/2017); 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); Spondylosis without myelopathy or radiculopathy, cervical region; Cervical  facet syndrome (Bilateral); Abnormal MRI, cervical spine (10/02/2019 & 08/13/2017); History of fusion of cervical spine; Chronic neck pain (4th area of Pain) (Bilateral) (L>R); Chronic neck pain with history of cervical spinal surgery; LLQ abdominal pain; Neuropathic pain; Nondisplaced fracture of proximal phalanx of left thumb with routine healing; 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, sequelae (Right); Cervical paraspinal muscle spasm; Trigger point of neck; Chronic hip pain (Bilateral); Sacroiliac joint pain (Left); Sacroiliac joint dysfunction (Left); Somatic dysfunction of sacroiliac joint (Left); Chronic low back pain (Bilateral) w/o sciatica; DDD (degenerative disc disease), lumbosacral; Failed back surgical syndrome; Neuropathy of right foot; Lumbar facet syndrome (Bilateral); Chronic sacroiliac joint pain (Bilateral); Lumbosacral facet arthropathy (L4-5, L5-S1); Spondylosis without myelopathy or radiculopathy, lumbosacral region; and Lateral epicondylitis of right elbow on their pertinent problem list. Pain Assessment: Severity of Chronic pain is reported as a 2  (worse as the day goes on)/10. Location: Foot Right/denies. Onset: More than a month ago. Quality: Burning, Aching. Timing: Constant. Modifying factor(s): nothing much, meds. Vitals:  height is 5\' 8"  (1.727 m) and weight is 198 lb (89.8 kg). His temperature is 97.6 F (36.4 C). His blood pressure is 126/83 and his pulse is 64. His respiration is 16 and oxygen saturation is 99%.   Reason for encounter: both, medication management and post-procedure assessment.   The patient indicates doing well with the current medication regimen. No adverse reactions or side effects reported to the medications.   The patient reports having attained 100% relief of the pain for the duration of the local anesthetic after which it went down to ongoing 75% improvement.  He refers now being able to increase his level of activity and range of motion thanks to the palliative  procedure.  RTCB: 11/27/2020 Nonopioids transfer 11/21/2019: Lyrica and Robaxin  Post-Procedure Evaluation  Procedure (08/19/2020):  Procedure:           Anesthesia, Analgesia, Anxiolysis:  Type: Palliative Superficial Peroneal/Fibular (S-PN)           Nerve Block          Region:                        Level: Distal lower extremity. Laterality: Right-Side   Type: Local Anesthesia Indication(s): Analgesia         Local Anesthetic: Lidocaine 1-2% Route: Infiltration ([place]/IM) IV Access: Declined Sedation: Declined    Position: Supine    Indications: 1. Chronic ankle pain (2ry area of Pain) (Right)   2. Disorder of superficial peroneal nerve (Right)   3. 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 Neurogenic foot pain (Right)   5. Neuropathy of right foot     Pain Score: Pre-procedure: 8 /10 Post-procedure: 8 /10   Anxiolysis: none.  Effectiveness during initial hour after procedure (Ultra-Short Term Relief): 100 %.  Local anesthetic used: Long-acting (4-6 hours) Effectiveness: Defined as any analgesic benefit obtained secondary to the administration of local anesthetics. This carries significant diagnostic value as to the etiological location, or anatomical origin, of the pain. Duration of benefit is expected to coincide with the duration of the local anesthetic used.  Effectiveness during initial 4-6 hours after procedure (Short-Term Relief): 100 %.  Long-term benefit: Defined as any relief past the pharmacologic duration of the local anesthetics.  Effectiveness past the initial 6 hours after procedure (Long-Term Relief): 75 %.  Benefits, current: Defined as benefit present at the time of this evaluation.   Analgesia:   The patient indicates currently having an ongoing 75% relief of the ankle and foot pain with the procedure.  He is very happy with the results.  This treatment continues to be very effective for him. Function: Mr. Bunt reports improvement in  function ROM: Mr. Bingaman reports improvement in ROM  Pharmacotherapy Assessment  Analgesic: Hydrocodone/APAP 5/325 1 tablet every 6 hours (20 mg/day of hydrocodone).  (Unable to tolerate an oxycodone IR trial due to stomachaches, nausea, headaches, and excessive somnolence.  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   Monitoring: [place] PMP: PDMP reviewed during this encounter.       Pharmacotherapy: No side-effects or adverse reactions reported. Compliance: No problems identified. Effectiveness: Clinically acceptable.  Ignatius Specking, RN  08/27/2020  8:15 AM  Sign when Signing Visit Nursing Pain Medication Assessment:  Safety precautions to be maintained throughout the outpatient stay will include: orient to surroundings, keep bed in low position, maintain call bell within reach at all times, provide assistance with transfer out of bed and ambulation.  Medication Inspection Compliance: Pill count conducted under aseptic conditions, in front of the patient. Neither the pills nor the bottle was removed from the patient's sight at any time. Once count was completed pills were immediately returned to the patient in their original bottle.  Medication: Hydrocodone/APAP Pill/Patch Count:  11 of 120 pills remain Pill/Patch Appearance: Markings consistent with prescribed medication Bottle Appearance: Standard pharmacy container. Clearly labeled. Filled Date: 7 / 27 / 2022 Last Medication intake:  Today     UDS:  Summary  Date Value Ref Range Status  09/11/2019 Note  Final    Comment:    ==================================================================== [employer] Select 13 (MW) ==================================================================== Test                             Result  Flag       Units  Drug Present and Declared for Prescription Verification   Hydrocodone                    1156         EXPECTED   ng/mg creat   Hydromorphone                   500          EXPECTED   ng/mg creat   Dihydrocodeine                 90           EXPECTED   ng/mg creat   Norhydrocodone                 1409         EXPECTED   ng/mg creat    Sources of hydrocodone include scheduled prescription medications.    Hydromorphone, dihydrocodeine and norhydrocodone are expected    metabolites of hydrocodone. Hydromorphone and dihydrocodeine are    also available as scheduled prescription medications.  Drug Present not Declared for Prescription Verification   Tramadol                       370          UNEXPECTED ng/mg creat   O-Desmethyltramadol            545          UNEXPECTED ng/mg creat   N-Desmethyltramadol            48           UNEXPECTED ng/mg creat    Source of tramadol is a prescription medication. O-desmethyltramadol    and N-desmethyltramadol are expected metabolites of tramadol.  ==================================================================== Test                      Result    Flag   Units      Ref Range   Creatinine              176              mg/dL      >=20 ==================================================================== Declared Medications:  The flagging and interpretation on this report are based on the  following declared medications.  Unexpected results may arise from  inaccuracies in the declared medications.   **Note: The testing scope of this panel includes these medications:   Hydrocodone (Norco)   **Note: The testing scope of this panel does not include the  following reported medications:   Acetaminophen (Norco)  Aspirin  Cetirizine (Zyrtec)  Doxycycline  Fluticasone (Flonase)  Hydrochlorothiazide  Hydrocortisone  Isosorbide (Imdur)  Losartan (Cozaar)  Methocarbamol (Robaxin)  Mometasone  Naloxone (Narcan)  Neomycin  Nitroglycerin (Nitrostat)  Polymyxin B (Polymyxin)  Pregabalin (Lyrica)  Rosuvastatin (Crestor)  Supplement  Tadalafil (Cialis)  Topical  Topical Lidocaine (Lidoderm)   Umeclidinium (Anoro)  Vilanterol (Anoro) ==================================================================== For clinical consultation, please call [phone]. ====================================================================      ROS  Constitutional: Denies any fever or chills Gastrointestinal: No reported hemesis, hematochezia, vomiting, or acute GI distress Musculoskeletal: Denies any acute onset joint swelling, redness, loss of ROM, or weakness Neurological: No reported episodes of acute onset apraxia, aphasia, dysarthria, agnosia, amnesia, paralysis, loss of coordination, or loss of consciousness  Medication Review  HYDROcodone-acetaminophen, Melatonin, Turmeric, albuterol, aspirin EC, cetirizine, fluocinolone, fluticasone, hydrochlorothiazide,  ibuprofen, isosorbide mononitrate, losartan, methocarbamol, mometasone, montelukast, naloxone, nitroGLYCERIN, pregabalin, rosuvastatin, umeclidinium-vilanterol, and varenicline  History Review  Allergy: [name] is allergic to lisinopril and oxycodone. Drug: [name]  reports no history of drug use. Alcohol:  reports current alcohol use of about 4.0 standard drinks per week. Tobacco:  reports that he has been smoking cigarettes. He has a 25.50 pack-year smoking history. He has quit using smokeless tobacco. Social: [name]  reports that he has been smoking cigarettes. He has a 25.50 pack-year smoking history. He has quit using smokeless tobacco. He reports current alcohol use of about 4.0 standard drinks per week. He reports that he does not use drugs. Medical:  has a past medical history of Chronic foot pain, right (2015), COPD (chronic obstructive pulmonary disease) ([place]), Coronary artery disease, Cough syncope, Emphysema lung ([place]), Family history of adverse reaction to anesthesia, History of kidney stones, Hyperlipidemia, Hypertension, Kidney stone, Leucocytosis, Myocardial infarction ([place]) (2015), and OSA on CPAP. Surgical: Mr.  Hanton  has a past surgical history that includes Lumbar laminectomy (1989, 1999); Cervical fusion (1988, 1998); Liver surgery (2015); Partial colectomy (1990); Inguinal hernia repair (Bilateral, 1975); Lithotripsy; Colonoscopy with propofol (N/A, 02/24/2017); Cardiac catheterization (2015); Arthrodesis metatarsal (Right, 11/26/2019); and Lesion removal (Right, 11/26/2019). Family: family history includes Alzheimer's disease in his paternal grandmother; Alzheimer's disease (age of onset: 55) in his father; Breast cancer in his maternal uncle; CAD in his mother; Dementia in his father; Diabetes in his maternal grandmother; Healthy in his sister; Heart attack in his maternal uncle; Heart failure in his maternal grandmother; Heart failure (age of onset: 2) in his mother; Non-Hodgkin's lymphoma in his sister.  Laboratory Chemistry Profile   Renal Lab Results  Component Value Date   BUN 16 02/23/2020   CREATININE 0.73 02/23/2020   BCR 18 09/20/2019   GFRAA 112 09/20/2019   GFRNONAA >60 02/23/2020    Hepatic Lab Results  Component Value Date   AST 15 09/20/2019   ALT 14 09/20/2019   ALBUMIN 4.1 09/20/2019   ALKPHOS 67 09/20/2019    Electrolytes Lab Results  Component Value Date   NA 131 (L) 02/23/2020   K 3.6 02/23/2020   CL 95 (L) 02/23/2020   CALCIUM 9.1 02/23/2020   MG 2.0 05/30/2018    Bone Lab Results  Component Value Date   25OHVITD1 32 02/17/2017   25OHVITD2 <1.0 02/17/2017   25OHVITD3 32 02/17/2017   TESTOFREE 5.2 (L) 02/28/2017   TESTOSTERONE 279 02/28/2017    Inflammation (CRP: Acute Phase) (ESR: Chronic Phase) Lab Results  Component Value Date   CRP 8 11/07/2017   ESRSEDRATE 26 11/07/2017         Note: Above Lab results reviewed.  Recent Imaging Review  DG PAIN CLINIC C-ARM 1-60 MIN NO REPORT Fluoro was used, but no Radiologist interpretation will be provided.  Please refer to "NOTES" tab for provider progress note. Note: Reviewed        Physical Exam   General appearance: Well nourished, well developed, and well hydrated. In no apparent acute distress Mental status: Alert, oriented x 3 (person, place, & time)       Respiratory: No evidence of acute respiratory distress Eyes: PERLA Vitals: BP 126/83   Pulse 64   Temp 97.6 F (36.4 C)   Resp 16   Ht $R'5\' 8"'KR$  (1.727 m)   Wt 198 lb (89.8 kg)   SpO2 99%   BMI 30.11 kg/m  BMI: Estimated body mass index is 30.11 kg/m as  calculated from the following:   Height as of this encounter: $RemoveBeforeD'5\' 8"'GTzOjpadLHpSml$  (1.727 m).   Weight as of this encounter: 198 lb (89.8 kg). Ideal: Ideal body weight: 68.4 kg (150 lb 12.7 oz) Adjusted ideal body weight: 77 kg (169 lb 10.8 oz)  Assessment   Status Diagnosis  Controlled Controlled Controlled 1. Chronic pain syndrome   2. Chronic ankle pain (2ry area of Pain) (Right)   3. Disorder of superficial peroneal nerve (Right)   4. 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 Neurogenic foot pain (Right)   6. Neuropathy of right foot   7. Chronic foot pain (1ry area of Pain) (Right)   8. Pharmacologic therapy   9. Chronic use of opiate for therapeutic purpose   10. Encounter for chronic pain management      Updated Problems: Problem  Lateral Epicondylitis of Right Elbow  Community Acquired Pneumonia of Right Middle Lobe of Lung  Prediabetes    Plan of Care  Problem-specific:  No problem-specific Assessment & Plan notes found for this encounter.  [name] has a current medication list which includes the following long-term medication(s): albuterol, cetirizine, fluticasone, hydrochlorothiazide, [START ON 08/29/2020] hydrocodone-acetaminophen, [START ON 09/28/2020] hydrocodone-acetaminophen, [START ON 10/28/2020] hydrocodone-acetaminophen, isosorbide mononitrate, losartan, methocarbamol, montelukast, nitroglycerin, pregabalin, and rosuvastatin.  Pharmacotherapy (Medications Ordered): Meds ordered this encounter  Medications   HYDROcodone-acetaminophen  (NORCO) 7.5-325 MG tablet    Sig: Take 1 tablet by mouth every 6 (six) hours as needed for severe pain. Must last 30 days.    Dispense:  120 tablet    Refill:  0    Not a duplicate. Do NOT delete! Dispense 1 day early if closed on fill date. Warn not to take CNS-depressants 8 hours before or after taking opioid. Do not send refill request. Renewal requires appointment.   HYDROcodone-acetaminophen (NORCO) 7.5-325 MG tablet    Sig: Take 1 tablet by mouth every 6 (six) hours as needed for severe pain. Must last 30 days.    Dispense:  120 tablet    Refill:  0    Not a duplicate. Do NOT delete! Dispense 1 day early if closed on fill date. Warn not to take CNS-depressants 8 hours before or after taking opioid. Do not send refill request. Renewal requires appointment.   HYDROcodone-acetaminophen (NORCO) 7.5-325 MG tablet    Sig: Take 1 tablet by mouth every 6 (six) hours as needed for severe pain. Must last 30 days.    Dispense:  120 tablet    Refill:  0    Not a duplicate. Do NOT delete! Dispense 1 day early if closed on fill date. Warn not to take CNS-depressants 8 hours before or after taking opioid. Do not send refill request. Renewal requires appointment.    Orders:  Orders Placed This Encounter  Procedures   Tox[employer] Select 13 (MW), Urine    Volume: 30 ml(s). Minimum 3 ml of urine is needed. Document temperature of fresh sample. Indications: Long term (current) use of opiate analgesic (B51.025)    Order Specific Question:   Release to patient    Answer:   Immediate    Follow-up plan:   Return in about 3 months (around 11/27/2020) for Eval-day(M,W), (F2F), (MM).     Interventional Therapies  Risk  Complexity Considerations:   Estimated body mass index is 30.04 kg/m as calculated from the following:   Height as of 06/20/20: $RemoveBef'5\' 8"'UdlqRbuAqt$  (1.727 m).   Weight as of 06/20/20: 197 lb 9.6 oz (  89.6 kg). WNL   Planned  Pending:   Diagnostic bilateral lumbar facet MBB #2    Under  consideration:   Possible spinal cord stimulator trial  Possible bilateral cervical facet RFA    Completed:   Therapeutic/palliative left CESI x8 (06/19/2020) (6-0) (100/100/80/80)  Diagnostic bilateral cervical facet MBB x2 (09/22/2017) (5-1) (100/100/100/>50)  Diagnostic bilateral lumbar facet MBB x1 (01/29/2020) (3-0) (100/100/90/90)  Diagnostic left SI joint block x1 (06/19/2019) (8-0) (100/100/100 x3 days/0)  Therapeutic midline serratus posterior TPI/MNB x1 (01/11/2019)  Diagnostic right Common Peroneal NB (C-PNB) x2 (04/14/2017) (5-0) (100/100/20/<25)  Therapeutic right common peroneal nerve (C-PN) RFA x1 (06/21/2017) (3-0) (100/100/100 x 3 days/75-100)  Diagnostic right superficial peroneal (S-PN) NB x4 (09/13/2019) (7-3) (100/100/100/90-100) Therapeutic right superficial peroneal nerve (S-PN) RFA x1 (10/25/2017) (4-0) (100/100/100 x1 day/25) Therapeutic right deep peroneal NB (D-PNB) x1 (05/03/2019)    Therapeutic  Palliative (PRN) options:   Palliative left CESI  Diagnostic bilateral cervical facet block #3  Palliative right superficial peroneal NB #5  Palliative right superficial peroneal nerve RFA #2        Recent Visits Date Type Provider Dept  08/19/20 Procedure visit Milinda Pointer, MD [facility]  08/06/20 Telemedicine Milinda Pointer, MD [facility]  07/02/20 Telemedicine Milinda Pointer, MD [facility]  06/19/20 Procedure visit Milinda Pointer, MD [facility]  06/04/20 Telemedicine Milinda Pointer, MD [facility]  Showing recent visits within past 90 days and meeting all other requirements Today's Visits Date Type Provider Dept  08/27/20 Office Visit Milinda Pointer, MD [facility]  Showing today's visits and meeting all other requirements Future Appointments No visits were found meeting these conditions. Showing future appointments within next 90 days and meeting all other requirements I  discussed the assessment and treatment plan with the patient. The patient was provided an opportunity to ask questions and all were answered. The patient agreed with the plan and demonstrated an understanding of the instructions.  Patient advised to call back or seek an in-person evaluation if the symptoms or condition worsens.  Duration of encounter: 30 minutes.  Note by: Gaspar Cola, MD Date: 08/27/2020; Time: 8:55 AM

## 2020-08-27 ENCOUNTER — Ambulatory Visit: Payer: Medicaid Other | Attending: Pain Medicine | Admitting: Pain Medicine

## 2020-08-27 ENCOUNTER — Other Ambulatory Visit: Payer: Self-pay

## 2020-08-27 VITALS — BP 126/83 | HR 64 | Temp 97.6°F | Resp 16 | Ht 68.0 in | Wt 198.0 lb

## 2020-08-27 DIAGNOSIS — G8929 Other chronic pain: Secondary | ICD-10-CM

## 2020-08-27 DIAGNOSIS — G5791 Unspecified mononeuropathy of right lower limb: Secondary | ICD-10-CM | POA: Diagnosis not present

## 2020-08-27 DIAGNOSIS — M25571 Pain in right ankle and joints of right foot: Secondary | ICD-10-CM | POA: Diagnosis not present

## 2020-08-27 DIAGNOSIS — Z79899 Other long term (current) drug therapy: Secondary | ICD-10-CM

## 2020-08-27 DIAGNOSIS — M79671 Pain in right foot: Secondary | ICD-10-CM | POA: Diagnosis not present

## 2020-08-27 DIAGNOSIS — Z79891 Long term (current) use of opiate analgesic: Secondary | ICD-10-CM

## 2020-08-27 DIAGNOSIS — M792 Neuralgia and neuritis, unspecified: Secondary | ICD-10-CM

## 2020-08-27 DIAGNOSIS — G894 Chronic pain syndrome: Secondary | ICD-10-CM | POA: Diagnosis not present

## 2020-08-27 DIAGNOSIS — S8411XS Injury of peroneal nerve at lower leg level, right leg, sequela: Secondary | ICD-10-CM

## 2020-08-27 DIAGNOSIS — G5731 Lesion of lateral popliteal nerve, right lower limb: Secondary | ICD-10-CM | POA: Diagnosis not present

## 2020-08-27 MED ORDER — HYDROCODONE-ACETAMINOPHEN 7.5-325 MG PO TABS: 1.0000 | ORAL_TABLET | Freq: Four times a day (QID) | ORAL | 0 refills | Status: DC | PRN

## 2020-08-27 NOTE — Patient Instructions (Signed)
____________________________________________________________________________________________  Medication Rules  Purpose: To inform patients, and their family members, of our rules and regulations.  Applies to: All patients receiving prescriptions (written or electronic).  Pharmacy of record: Pharmacy where electronic prescriptions will be sent. If written prescriptions are taken to a different pharmacy, please inform the nursing staff. The pharmacy listed in the electronic medical record should be the one where you would like electronic prescriptions to be sent.  Electronic prescriptions: In compliance with the [place] Strengthen Opioid Misuse Prevention (STOP) Act of 2017 (Session Law 2017-74/H243), effective January 04, 2018, all controlled substances must be electronically prescribed. Calling prescriptions to the pharmacy will cease to exist.  Prescription refills: Only during scheduled appointments. Applies to all prescriptions.  NOTE: The following applies primarily to controlled substances (Opioid* Pain Medications).   Type of encounter (visit): For patients receiving controlled substances, face-to-face visits are required. (Not an option or up to the patient.)  Patient's responsibilities: Pain Pills: Bring all pain pills to every appointment (except for procedure appointments). Pill Bottles: Bring pills in original pharmacy bottle. Always bring the newest bottle. Bring bottle, even if empty. Medication refills: You are responsible for knowing and keeping track of what medications you take and those you need refilled. The day before your appointment: write a list of all prescriptions that need to be refilled. The day of the appointment: give the list to the admitting nurse. Prescriptions will be written only during appointments. No prescriptions will be written on procedure days. If you forget a medication: it will not be "Called in", "Faxed", or "electronically sent". You will  need to get another appointment to get these prescribed. No early refills. Do not call asking to have your prescription filled early. Prescription Accuracy: You are responsible for carefully inspecting your prescriptions before leaving our office. Have the discharge nurse carefully go over each prescription with you, before taking them home. Make sure that your name is accurately spelled, that your address is correct. Check the name and dose of your medication to make sure it is accurate. Check the number of pills, and the written instructions to make sure they are clear and accurate. Make sure that you are given enough medication to last until your next medication refill appointment. Taking Medication: Take medication as prescribed. When it comes to controlled substances, taking less pills or less frequently than prescribed is permitted and encouraged. Never take more pills than instructed. Never take medication more frequently than prescribed.  Inform other Doctors: Always inform, all of your healthcare providers, of all the medications you take. Pain Medication from other Providers: You are not allowed to accept any additional pain medication from any other Doctor or Healthcare provider. There are two exceptions to this rule. (see below) In the event that you require additional pain medication, you are responsible for notifying us, as stated below. Cough Medicine: Often these contain an opioid, such as codeine or hydrocodone. Never accept or take cough medicine containing these opioids if you are already taking an opioid* medication. The combination may cause respiratory failure and death. Medication Agreement: You are responsible for carefully reading and following our Medication Agreement. This must be signed before receiving any prescriptions from our practice. Safely store a copy of your signed Agreement. Violations to the Agreement will result in no further prescriptions. (Additional copies of our  Medication Agreement are available upon request.) Laws, Rules, & Regulations: All patients are expected to follow all Federal and State Laws, Statutes, Rules, & Regulations. Ignorance of   the Laws does not constitute a valid excuse.  Illegal drugs and Controlled Substances: The use of illegal substances (including, but not limited to marijuana and its derivatives) and/or the illegal use of any controlled substances is strictly prohibited. Violation of this rule may result in the immediate and permanent discontinuation of any and all prescriptions being written by our practice. The use of any illegal substances is prohibited. Adopted CDC guidelines & recommendations: Target dosing levels will be at or below 60 MME/day. Use of benzodiazepines** is not recommended.  Exceptions: There are only two exceptions to the rule of not receiving pain medications from other Healthcare Providers. Exception #1 (Emergencies): In the event of an emergency (i.e.: accident requiring emergency care), you are allowed to receive additional pain medication. However, you are responsible for: As soon as you are able, call our office [phone], at any time of the day or night, and leave a message stating your name, the date and nature of the emergency, and the name and dose of the medication prescribed. In the event that your call is answered by a member of our staff, make sure to document and save the date, time, and the name of the person that took your information.  Exception #2 (Planned Surgery): In the event that you are scheduled by another doctor or dentist to have any type of surgery or procedure, you are allowed (for a period no longer than 30 days), to receive additional pain medication, for the acute post-op pain. However, in this case, you are responsible for picking up a copy of our "Post-op Pain Management for Surgeons" handout, and giving it to your surgeon or dentist. This document is available at our office, and  does not require an appointment to obtain it. Simply go to our office during business hours (Monday-Thursday from 8:00 AM to 4:00 PM) (Friday 8:00 AM to 12:00 Noon) or if you have a scheduled appointment with us, prior to your surgery, and ask for it by name. In addition, you are responsible for: calling our office [phone], at any time of the day or night, and leaving a message stating your name, name of your surgeon, type of surgery, and date of procedure or surgery. Failure to comply with your responsibilities may result in termination of therapy involving the controlled substances.  *Opioid medications include: morphine, codeine, oxycodone, oxymorphone, hydrocodone, hydromorphone, meperidine, tramadol, tapentadol, buprenorphine, fentanyl, methadone. **Benzodiazepine medications include: diazepam (Valium), alprazolam (Xanax), clonazepam (Klonopine), lorazepam (Ativan), clorazepate (Tranxene), chlordiazepoxide (Librium), estazolam (Prosom), oxazepam (Serax), temazepam (Restoril), triazolam (Halcion) (Last updated: 12/03/2019) ____________________________________________________________________________________________  ____________________________________________________________________________________________  Medication Recommendations and Reminders  Applies to: All patients receiving prescriptions (written and/or electronic).  Medication Rules & Regulations: These rules and regulations exist for your safety and that of others. They are not flexible and neither are we. Dismissing or ignoring them will be considered "non-compliance" with medication therapy, resulting in complete and irreversible termination of such therapy. (See document titled "Medication Rules" for more details.) In all conscience, because of safety reasons, we cannot continue providing a therapy where the patient does not follow instructions.  Pharmacy of record:  Definition: This is the pharmacy where your electronic  prescriptions will be sent.  We do not endorse any particular pharmacy, however, we have experienced problems with [facility] not securing enough medication supply for the community. We do not restrict you in your choice of pharmacy. However, once we write for your prescriptions, we will NOT be re-sending more prescriptions to fix restricted supply problems   created by your pharmacy, or your insurance.  The pharmacy listed in the electronic medical record should be the one where you want electronic prescriptions to be sent. If you choose to change pharmacy, simply notify our nursing staff.  Recommendations: Keep all of your pain medications in a safe place, under lock and key, even if you live alone. We will NOT replace lost, stolen, or damaged medication. After you fill your prescription, take 1 week's worth of pills and put them away in a safe place. You should keep a separate, properly labeled bottle for this purpose. The remainder should be kept in the original bottle. Use this as your primary supply, until it runs out. Once it's gone, then you know that you have 1 week's worth of medicine, and it is time to come in for a prescription refill. If you do this correctly, it is unlikely that you will ever run out of medicine. To make sure that the above recommendation works, it is very important that you make sure your medication refill appointments are scheduled at least 1 week before you run out of medicine. To do this in an effective manner, make sure that you do not leave the office without scheduling your next medication management appointment. Always ask the nursing staff to show you in your prescription , when your medication will be running out. Then arrange for the receptionist to get you a return appointment, at least 7 days before you run out of medicine. Do not wait until you have 1 or 2 pills left, to come in. This is very poor planning and does not take into consideration that we may need to  cancel appointments due to bad weather, sickness, or emergencies affecting our staff. DO NOT ACCEPT A "Partial Fill": If for any reason your pharmacy does not have enough pills/tablets to completely fill or refill your prescription, do not allow for a "partial fill". The law allows the pharmacy to complete that prescription within 72 hours, without requiring a new prescription. If they do not fill the rest of your prescription within those 72 hours, you will need a separate prescription to fill the remaining amount, which we will NOT provide. If the reason for the partial fill is your insurance, you will need to talk to the pharmacist about payment alternatives for the remaining tablets, but again, DO NOT ACCEPT A PARTIAL FILL, unless you can trust your pharmacist to obtain the remainder of the pills within 72 hours.  Prescription refills and/or changes in medication(s):  Prescription refills, and/or changes in dose or medication, will be conducted only during scheduled medication management appointments. (Applies to both, written and electronic prescriptions.) No refills on procedure days. No medication will be changed or started on procedure days. No changes, adjustments, and/or refills will be conducted on a procedure day. Doing so will interfere with the diagnostic portion of the procedure. No phone refills. No medications will be "called into the pharmacy". No Fax refills. No weekend refills. No Holliday refills. No after hours refills.  Remember:  Business hours are:  Monday to Thursday 8:00 AM to 4:00 PM Provider's Schedule: Oakley Orban, MD - Appointments are:  Medication management: Monday and Wednesday 8:00 AM to 4:00 PM Procedure day: Tuesday and Thursday 7:30 AM to 4:00 PM Bilal Lateef, MD - Appointments are:  Medication management: Tuesday and Thursday 8:00 AM to 4:00 PM Procedure day: Monday and Wednesday 7:30 AM to 4:00 PM (Last update:  07/25/2019) ____________________________________________________________________________________________  ____________________________________________________________________________________________  CBD (cannabidiol) WARNING    Applicable to: All individuals currently taking or considering taking CBD (cannabidiol) and, more important, all patients taking opioid analgesic controlled substances (pain medication). (Example: oxycodone; oxymorphone; hydrocodone; hydromorphone; morphine; methadone; tramadol; tapentadol; fentanyl; buprenorphine; butorphanol; dextromethorphan; meperidine; codeine; etc.)  Legal status: CBD remains a Schedule I drug prohibited for any use. CBD is illegal with one exception. In the [place], CBD has a limited Food and Drug Administration (FDA) approval for the treatment of two specific types of epilepsy disorders. Only one CBD product has been approved by the FDA for this purpose: "Epidiolex". FDA is aware that some companies are marketing products containing cannabis and cannabis-derived compounds in ways that violate the Federal Food, Drug and Cosmetic Act (FD&C Act) and that may put the health and safety of consumers at risk. The FDA, a Federal agency, has not enforced the CBD status since 2018.   Legality: Some manufacturers ship CBD products nationally, which is illegal. Often such products are sold online and are therefore available throughout the country. CBD is openly sold in head shops and health food stores in some states where such sales have not been explicitly legalized. Selling unapproved products with unsubstantiated therapeutic claims is not only a violation of the law, but also can put patients at risk, as these products have not been proven to be safe or effective. Federal illegality makes it difficult to conduct research on CBD.  Reference: "FDA Regulation of Cannabis and Cannabis-Derived Products, Including Cannabidiol (CBD)" -  https://www.fda.gov/news-events/public-health-focus/fda-regulation-cannabis-and-cannabis-derived-products-including-cannabidiol-cbd  Warning: CBD is not FDA approved and has not undergo the same manufacturing controls as prescription drugs.  This means that the purity and safety of available CBD may be questionable. Most of the time, despite manufacturer's claims, it is contaminated with THC (delta-9-tetrahydrocannabinol - the chemical in marijuana responsible for the "HIGH").  When this is the case, the THC contaminant will trigger a positive urine drug screen (UDS) test for Marijuana (carboxy-THC). Because a positive UDS for any illicit substance is a violation of our medication agreement, your opioid analgesics (pain medicine) may be permanently discontinued.  MORE ABOUT CBD  General Information: CBD  is a derivative of the Marijuana (cannabis sativa) plant discovered in 1940. It is one of the 113 identified substances found in Marijuana. It accounts for up to 40% of the plant's extract. As of 2018, preliminary clinical studies on CBD included research for the treatment of anxiety, movement disorders, and pain. CBD is available and consumed in multiple forms, including inhalation of smoke or vapor, as an aerosol spray, and by mouth. It may be supplied as an oil containing CBD, capsules, dried cannabis, or as a liquid solution. CBD is thought not to be as psychoactive as THC (delta-9-tetrahydrocannabinol - the chemical in marijuana responsible for the "HIGH"). Studies suggest that CBD may interact with different biological target receptors in the body, including cannabinoid and other neurotransmitter receptors. As of 2018 the mechanism of action for its biological effects has not been determined.  Side-effects  Adverse reactions: Dry mouth, diarrhea, decreased appetite, fatigue, drowsiness, malaise, weakness, sleep disturbances, and others.  Drug interactions: CBC may interact with other medications  such as blood-thinners. (Last update: 08/11/2019) ____________________________________________________________________________________________  ____________________________________________________________________________________________  Drug Holidays (Slow)  What is a "Drug Holiday"? Drug Holiday: is the name given to the period of time during which a patient stops taking a medication(s) for the purpose of eliminating tolerance to the drug.  Benefits Improved effectiveness of opioids. Decreased opioid dose needed to achieve benefits. Improved pain with lesser dose.    What is tolerance? Tolerance: is the progressive decreased in effectiveness of a drug due to its repetitive use. With repetitive use, the body gets use to the medication and as a consequence, it loses its effectiveness. This is a common problem seen with opioid pain medications. As a result, a larger dose of the drug is needed to achieve the same effect that used to be obtained with a smaller dose.  How long should a "Drug Holiday" last? You should stay off of the pain medicine for at least 14 consecutive days. (2 weeks)  Should I stop the medicine "cold turkey"? No. You should always coordinate with your Pain Specialist so that he/she can provide you with the correct medication dose to make the transition as smoothly as possible.  How do I stop the medicine? Slowly. You will be instructed to decrease the daily amount of pills that you take by one (1) pill every seven (7) days. This is called a "slow downward taper" of your dose. For example: if you normally take four (4) pills per day, you will be asked to drop this dose to three (3) pills per day for seven (7) days, then to two (2) pills per day for seven (7) days, then to one (1) per day for seven (7) days, and at the end of those last seven (7) days, this is when the "Drug Holiday" would start.   Will I have withdrawals? By doing a "slow downward taper" like this one, it  is unlikely that you will experience any significant withdrawal symptoms. Typically, what triggers withdrawals is the sudden stop of a high dose opioid therapy. Withdrawals can usually be avoided by slowly decreasing the dose over a prolonged period of time. If you do not follow these instructions and decide to stop your medication abruptly, withdrawals may be possible.  What are withdrawals? Withdrawals: refers to the wide range of symptoms that occur after stopping or dramatically reducing opiate drugs after heavy and prolonged use. Withdrawal symptoms do not occur to patients that use low dose opioids, or those who take the medication sporadically. Contrary to benzodiazepine (example: Valium, Xanax, etc.) or alcohol withdrawals ("Delirium Tremens"), opioid withdrawals are not lethal. Withdrawals are the physical manifestation of the body getting rid of the excess receptors.  Expected Symptoms Early symptoms of withdrawal may include: Agitation Anxiety Muscle aches Increased tearing Insomnia Runny nose Sweating Yawning  Late symptoms of withdrawal may include: Abdominal cramping Diarrhea Dilated pupils Goose bumps Nausea Vomiting  Will I experience withdrawals? Due to the slow nature of the taper, it is very unlikely that you will experience any.  What is a slow taper? Taper: refers to the gradual decrease in dose.  (Last update: 07/25/2019) ____________________________________________________________________________________________    

## 2020-08-27 NOTE — Progress Notes (Signed)
Nursing Pain Medication Assessment:  Safety precautions to be maintained throughout the outpatient stay will include: orient to surroundings, keep bed in low position, maintain call bell within reach at all times, provide assistance with transfer out of bed and ambulation.  Medication Inspection Compliance: Pill count conducted under aseptic conditions, in front of the patient. Neither the pills nor the bottle was removed from the patient's sight at any time. Once count was completed pills were immediately returned to the patient in their original bottle.  Medication: Hydrocodone/APAP Pill/Patch Count:  11 of 120 pills remain Pill/Patch Appearance: Markings consistent with prescribed medication Bottle Appearance: Standard pharmacy container. Clearly labeled. Filled Date: 7 / 27 / 2022 Last Medication intake:  Today

## 2020-08-28 ENCOUNTER — Ambulatory Visit: Payer: Medicaid Other

## 2020-08-28 ENCOUNTER — Encounter: Payer: Self-pay | Admitting: Adult Health

## 2020-08-28 ENCOUNTER — Ambulatory Visit: Payer: Medicaid Other | Admitting: Adult Health

## 2020-08-28 ENCOUNTER — Ambulatory Visit
Admission: RE | Admit: 2020-08-28 | Discharge: 2020-08-28 | Disposition: A | Discharge status: Home / Self Care | Payer: Medicaid Other | Attending: Audiology | Admitting: Audiology

## 2020-08-28 ENCOUNTER — Ambulatory Visit
Admission: RE | Admit: 2020-08-28 | Discharge: 2020-08-28 | Disposition: A | Discharge status: Home / Self Care | Payer: Medicaid Other | Source: Ambulatory Visit | Attending: Adult Health | Admitting: Adult Health

## 2020-08-28 VITALS — BP 108/60 | HR 75 | Temp 98.1°F | Ht 68.0 in | Wt 199.4 lb

## 2020-08-28 DIAGNOSIS — G4733 Obstructive sleep apnea (adult) (pediatric): Secondary | ICD-10-CM

## 2020-08-28 DIAGNOSIS — Z720 Tobacco use: Secondary | ICD-10-CM

## 2020-08-28 DIAGNOSIS — F17210 Nicotine dependence, cigarettes, uncomplicated: Secondary | ICD-10-CM | POA: Diagnosis not present

## 2020-08-28 DIAGNOSIS — J449 Chronic obstructive pulmonary disease, unspecified: Secondary | ICD-10-CM | POA: Diagnosis not present

## 2020-08-28 DIAGNOSIS — J189 Pneumonia, unspecified organism: Secondary | ICD-10-CM | POA: Diagnosis not present

## 2020-08-28 DIAGNOSIS — R0602 Shortness of breath: Secondary | ICD-10-CM
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
## 2020-08-28 NOTE — Patient Instructions (Addendum)
Continue on ANORO 1 puff daily  Work on not smoking  Chest xray today .  DME Mask fitting .  Add saline nasal spray 2 puffs Twice daily   Add saline nasal gel At bedtime  .  Change Flonase As needed   Try to wear BIPAP At bedtime  -wear at least 4hrs each night  Follow up with Dr. Hermina Staggers in 3 months and As needed

## 2020-08-29 ENCOUNTER — Encounter: Payer: Self-pay | Admitting: *Deleted

## 2020-08-29 NOTE — Assessment & Plan Note (Signed)
Continue with smoking cessation.

## 2020-08-29 NOTE — Assessment & Plan Note (Signed)
Continue on current regimen.  Chest x-ray today  Plan  Patient Instructions  Continue on ANORO 1 puff daily  Work on not smoking  Chest xray today .  DME Mask fitting .  Add saline nasal spray 2 puffs Twice daily   Add saline nasal gel At bedtime  .  Change Flonase As needed   Try to wear BIPAP At bedtime  -wear at least 4hrs each night  Follow up with Dr. Hermina Staggers in 3 months and As needed

## 2020-08-29 NOTE — Assessment & Plan Note (Signed)
Patient education on sleep apnea.  Potential complications of untreated sleep apnea.  Patient is to restart BiPAP.  Mask fitting as discussed.  We will try to add in saline nasal gel and spray decrease his Flonase use to see if this will help with nasal dryness.  Plan  Patient Instructions  Continue on ANORO 1 puff daily  Work on not smoking  Chest xray today .  DME Mask fitting .  Add saline nasal spray 2 puffs Twice daily   Add saline nasal gel At bedtime  .  Change Flonase As needed   Try to wear BIPAP At bedtime  -wear at least 4hrs each night  Follow up with Dr. Hermina Staggers in 3 months and As needed

## 2020-08-30 LAB — TOXASSURE SELECT 13 (MW), URINE

## 2020-09-04 ENCOUNTER — Other Ambulatory Visit: Payer: Self-pay | Admitting: Internal Medicine

## 2020-09-10 DIAGNOSIS — N1831 Chronic kidney disease, stage 3a: Secondary | ICD-10-CM | POA: Diagnosis not present

## 2020-09-10 DIAGNOSIS — R808 Other proteinuria: Secondary | ICD-10-CM | POA: Diagnosis not present

## 2020-09-12 ENCOUNTER — Other Ambulatory Visit: Payer: Self-pay

## 2020-09-12 ENCOUNTER — Ambulatory Visit: Admission: RE | Admit: 2020-09-12 | Payer: Medicaid Other | Source: Home / Self Care | Admitting: *Deleted

## 2020-09-12 ENCOUNTER — Ambulatory Visit
Admission: RE | Admit: 2020-09-12 | Discharge: 2020-09-12 | Disposition: A | Discharge status: Home / Self Care | Payer: Medicaid Other | Attending: Family Medicine | Admitting: Family Medicine

## 2020-09-12 ENCOUNTER — Ambulatory Visit
Admission: RE | Admit: 2020-09-12 | Discharge: 2020-09-12 | Disposition: A | Discharge status: Home / Self Care | Payer: Medicaid Other | Source: Ambulatory Visit | Attending: Family Medicine | Admitting: Family Medicine

## 2020-09-12 ENCOUNTER — Encounter: Payer: Self-pay | Admitting: Family Medicine

## 2020-09-12 ENCOUNTER — Ambulatory Visit (INDEPENDENT_AMBULATORY_CARE_PROVIDER_SITE_OTHER): Payer: Medicaid Other | Admitting: Family Medicine

## 2020-09-12 VITALS — BP 109/76 | HR 65 | Temp 98.0°F | Resp 16 | Wt 196.5 lb

## 2020-09-12 DIAGNOSIS — G8929 Other chronic pain: Secondary | ICD-10-CM

## 2020-09-12 DIAGNOSIS — M898X7 Other specified disorders of bone, ankle and foot: Secondary | ICD-10-CM

## 2020-09-12 DIAGNOSIS — Z720 Tobacco use: Secondary | ICD-10-CM

## 2020-09-12 DIAGNOSIS — I10 Essential (primary) hypertension: Secondary | ICD-10-CM

## 2020-09-12 DIAGNOSIS — M7918 Myalgia, other site: Secondary | ICD-10-CM

## 2020-09-12 DIAGNOSIS — G4733 Obstructive sleep apnea (adult) (pediatric): Secondary | ICD-10-CM | POA: Diagnosis not present

## 2020-09-12 DIAGNOSIS — Z23 Encounter for immunization: Secondary | ICD-10-CM | POA: Diagnosis not present

## 2020-09-12 DIAGNOSIS — R7303 Prediabetes: Secondary | ICD-10-CM | POA: Diagnosis not present

## 2020-09-12 DIAGNOSIS — R200 Anesthesia of skin: Secondary | ICD-10-CM | POA: Diagnosis not present

## 2020-09-12 DIAGNOSIS — E7800 Pure hypercholesterolemia, unspecified: Secondary | ICD-10-CM

## 2020-09-12 LAB — POCT GLYCOSYLATED HEMOGLOBIN (HGB A1C)
Est. average glucose Bld gHb Est-mCnc: 120
Hemoglobin A1C: 5.8 % — AB (ref 4.0–5.6)

## 2020-09-12 NOTE — Assessment & Plan Note (Signed)
Still followed by pain management

## 2020-09-12 NOTE — Assessment & Plan Note (Signed)
Well controlled Continue current medications Recheck metabolic panel F/u in 6 months  

## 2020-09-12 NOTE — Progress Notes (Signed)
Established patient visit   Patient: Caleb [name]   DOB: Jan 12, 1955   65 y.o. Male  MRN: [id number] Visit Date: 09/12/2020  Today's healthcare provider: Lavon Paganini, MD   Chief Complaint  Patient presents with   Hypertension   Hyperlipidemia   Hyperglycemia   Follow-up   Subjective    Hypertension Associated symptoms include shortness of breath. Pertinent negatives include no chest pain, headaches, neck pain or palpitations.  Hyperlipidemia Associated symptoms include myalgias and shortness of breath. Pertinent negatives include no chest pain.  Hyperglycemia Associated symptoms include myalgias. Pertinent negatives include no abdominal pain, chest pain, chills, coughing, fatigue, fever, headaches, nausea, neck pain, numbness, sore throat, vomiting or weakness.   Hypertension, follow-up  BP Readings from Last 3 Encounters:  09/12/20 109/76  08/28/20 108/60  08/27/20 126/83   Wt Readings from Last 3 Encounters:  09/12/20 196 lb 8 oz (89.1 kg)  08/28/20 199 lb 6.4 oz (90.4 kg)  08/27/20 198 lb (89.8 kg)     He was last seen for hypertension 6 months ago.  BP at that visit was 131/86. Management since that visit includes no changes.  He reports excellent compliance with treatment. He is not having side effects.  He is following a Regular diet. He is exercising. He does not smoke.  Use of agents associated with hypertension: none.   Outside blood pressures are not being checked. Symptoms: No chest pain No chest pressure  No palpitations No syncope  Yes dyspnea No orthopnea  No paroxysmal nocturnal dyspnea No lower extremity edema   Pertinent labs: Lab Results  Component Value Date   CHOL 123 09/20/2019   HDL 46 09/20/2019   LDLCALC 57 09/20/2019   TRIG 112 09/20/2019   CHOLHDL 2.6 12/18/2018   Lab Results  Component Value Date   NA 131 (L) 02/23/2020   K 3.6 02/23/2020   CREATININE 0.73 02/23/2020   GFRNONAA >60 02/23/2020   GFRAA 112  09/20/2019   GLUCOSE 107 (H) 02/23/2020     The ASCVD Risk score (Arnett DK, et al., 2019) failed to calculate for the following reasons:   The patient has a prior MI or stroke diagnosis   --------------------------------------------------------------------------------------------------- Lipid/Cholesterol, Follow-up  Last lipid panel Other pertinent labs  Lab Results  Component Value Date   CHOL 123 09/20/2019   HDL 46 09/20/2019   LDLCALC 57 09/20/2019   TRIG 112 09/20/2019   CHOLHDL 2.6 12/18/2018   Lab Results  Component Value Date   ALT 14 09/20/2019   AST 15 09/20/2019   PLT 220 02/23/2020     He was last seen for this 6 months ago.  Management since that visit includes no changes.  He reports excellent compliance with treatment. He is not having side effects.   Symptoms: No chest pain No chest pressure/discomfort  No dyspnea No lower extremity edema  No numbness or tingling of extremity No orthopnea  No palpitations No paroxysmal nocturnal dyspnea  No speech difficulty No syncope   Current diet: in general, a "healthy" diet   Current exercise: walking  The ASCVD Risk score (Arnett DK, et al., 2019) failed to calculate for the following reasons:   The patient has a prior MI or stroke diagnosis  --------------------------------------------------------------------------------------------------- Prediabetes, Follow-up  Lab Results  Component Value Date   HGBA1C 5.8 (A) 09/12/2020   HGBA1C 6.3 (H) 03/21/2020   HGBA1C 5.7 (A) 09/20/2019   GLUCOSE 107 (H) 02/23/2020   GLUCOSE 94 11/20/2019  GLUCOSE 97 09/20/2019    Last seen for for this6 months ago.  Management since that visit includes no changes. Current symptoms include none and have been stable.  Prior visit with dietician: no Current diet: in general, a "healthy" diet   Current exercise: walking  Pertinent Labs:    Component Value Date/Time   CHOL 123 09/20/2019 0922   TRIG 112 09/20/2019  0922   CHOLHDL 2.6 12/18/2018 1448   CHOLHDL 3.6 10/21/2016 1636   CREATININE 0.73 02/23/2020 1248   CREATININE 0.98 10/21/2016 1636    Wt Readings from Last 3 Encounters:  09/12/20 196 lb 8 oz (89.1 kg)  08/28/20 199 lb 6.4 oz (90.4 kg)  08/27/20 198 lb (89.8 kg)    ----------------------------------------------------------------------------------------- Follow up for chronic pain  The patient was last seen for this 6 months ago. Changes made at last visit include no changes, continue norco.  Last seen by Dr. Dossie Arbour on 08/27/20  He reports excellent compliance with treatment. He feels that condition is Unchanged. He is not having side effects.   Foot pain He will follow-up with Dr. Posey Pronto because of his foot pain. The pain causes difficulty walking. He is amenable to receiving an X-ray today.   Smoking He has cut back smoking and he is at the point were cigarettes leave a bad taste.    He is followed by pulmonology and he is breathing at about 60% post recovery from pneumonia.  Pneumonia-09/12/20 Decline flu vaccine at this time   -----------------------------------------------------------------------------------------     Medications: Outpatient Medications Prior to Visit  Medication Sig   ANORO ELLIPTA 62.5-25 MCG/INH AEPB INHALE 1 PUFF BY MOUTH EVERY DAY   aspirin EC 81 MG tablet Take 81 mg by mouth daily.    cetirizine (ZYRTEC) 10 MG tablet Take 1 tablet (10 mg total) by mouth daily.   fluocinolone (SYNALAR) 0.01 % external solution Apply topically 2 (two) times daily. 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   hydrochlorothiazide (HYDRODIURIL) 25 MG tablet Take 1 tablet (25 mg total) by mouth daily.   HYDROcodone-acetaminophen (NORCO) 7.5-325 MG tablet Take 1 tablet by mouth every 6  (six) hours as needed for severe pain. Must last 30 days.   [START ON 09/28/2020] HYDROcodone-acetaminophen (NORCO) 7.5-325 MG tablet Take 1 tablet by mouth every 6 (six) hours as needed for severe pain. Must last 30 days.   [START ON 10/28/2020] HYDROcodone-acetaminophen (NORCO) 7.5-325 MG tablet Take 1 tablet by mouth every 6 (six) hours as needed for severe pain. Must last 30 days.   ibuprofen (ADVIL) 800 MG tablet Take 1 tablet (800 mg total) by mouth every 6 (six) hours as needed.   isosorbide mononitrate (IMDUR) 30 MG 24 hr tablet TAKE 0.5 TABLETS (15 MG TOTAL) BY MOUTH DAILY.   losartan (COZAAR) 100 MG tablet Take 1 tablet (100 mg total) by mouth daily.   Melatonin 10 MG CAPS Take 20 mg by mouth at bedtime.   methocarbamol (ROBAXIN) 750 MG tablet Take 1 tablet (750 mg total) by mouth every 8 (eight) hours as needed for muscle spasms.   mometasone (ELOCON) 0.1 % lotion APPLY 1 ML ONTO THE SKIN DAILY FOR PSORIASIS   montelukast (SINGULAIR) 10 MG tablet Take 1 tablet (10 mg total) by mouth at bedtime.   naloxone (NARCAN) nasal spray 4 mg/0.1 mL Use in  case of opioid overdose   nitroGLYCERIN (NITROSTAT) 0.4 MG SL tablet Place 1 tablet (0.4 mg total) under the tongue every 5 (five) minutes as needed for chest pain.   pregabalin (LYRICA) 150 MG capsule Take 1 capsule (150 mg total) by mouth 3 (three) times daily.   rosuvastatin (CRESTOR) 40 MG tablet TAKE 1 TABLET BY MOUTH EVERY DAY   TURMERIC PO Take 1 capsule by mouth daily.   varenicline (CHANTIX) 1 MG tablet Take 1 tablet (1 mg total) by mouth 2 (two) times daily.   albuterol (VENTOLIN HFA) 108 (90 Base) MCG/ACT inhaler Inhale 2 puffs into the lungs every 6 (six) hours as needed for wheezing or shortness of breath. (Patient not taking: No sig reported)   No facility-administered medications prior to visit.    Review of Systems  Constitutional:  Negative for activity change, appetite change, chills, fatigue and fever.  HENT:  Negative for  ear pain, sinus pressure, sinus pain and sore throat.   Eyes:  Negative for visual disturbance.  Respiratory:  Positive for shortness of breath. Negative for cough, chest tightness and wheezing.   Cardiovascular:  Negative for chest pain, palpitations and leg swelling.  Gastrointestinal:  Negative for abdominal pain, blood in stool, diarrhea, nausea and vomiting.  Genitourinary:  Negative for flank pain, frequency and urgency.  Musculoskeletal:  Positive for myalgias. Negative for back pain and neck pain.  Neurological:  Negative for dizziness, weakness, light-headedness, numbness and headaches.  Psychiatric/Behavioral:  Negative for sleep disturbance.        Objective    BP 109/76 (BP Location: Left Arm, Patient Position: Sitting, Cuff Size: Large)   Pulse 65   Temp 98 F (36.7 C) (Oral)   Resp 16   Wt 196 lb 8 oz (89.1 kg)   SpO2 97%   BMI 29.88 kg/m  BP Readings from Last 3 Encounters:  09/12/20 109/76  08/28/20 108/60  08/27/20 126/83   Wt Readings from Last 3 Encounters:  09/12/20 196 lb 8 oz (89.1 kg)  08/28/20 199 lb 6.4 oz (90.4 kg)  08/27/20 198 lb (89.8 kg)      Physical Exam Vitals reviewed.  Constitutional:      General: He is not in acute distress.    Appearance: Normal appearance. He is not diaphoretic.  HENT:     Head: Normocephalic and atraumatic.  Eyes:     General: No scleral icterus.    Conjunctiva/sclera: Conjunctivae normal.  Cardiovascular:     Rate and Rhythm: Normal rate and regular rhythm.     Pulses: Normal pulses.     Heart sounds: Normal heart sounds. No murmur heard. Pulmonary:     Effort: Pulmonary effort is normal. No respiratory distress.     Breath sounds: Normal breath sounds. No wheezing or rhonchi.  Abdominal:     General: There is no distension.     Palpations: Abdomen is soft.     Tenderness: There is no abdominal tenderness.  Musculoskeletal:     Cervical back: Neck supple.     Right lower leg: No edema.     Left lower  leg: No edema.     Comments: TTP along length of R 5th metatarsal  Lymphadenopathy:     Cervical: No cervical adenopathy.  Skin:    General: Skin is warm and dry.     Capillary Refill: Capillary refill takes less than 2 seconds.     Findings: No rash.  Neurological:     Mental Status: He is  alert and oriented to person, place, and time.     Cranial Nerves: No cranial nerve deficit.  Psychiatric:        Mood and Affect: Mood normal.        Behavior: Behavior normal.      Results for orders placed or performed in visit on 09/12/20  POCT glycosylated hemoglobin (Hb A1C)  Result Value Ref Range   Hemoglobin A1C 5.8 (A) 4.0 - 5.6 %   Est. average glucose Bld gHb Est-mCnc 120     Assessment & Plan     Problem List Items Addressed This Visit       Cardiovascular and Mediastinum   Essential hypertension - Primary    Well controlled Continue current medications Recheck metabolic panel F/u in 6 months       Relevant Orders   Comprehensive metabolic panel     Other   Chronic musculoskeletal pain (Chronic)    Still followed by pain management      Hyperlipidemia    Previously well controlled Continue statin Repeat FLP and CMP Goal LDL < 70      Relevant Orders   Lipid Panel With LDL/HDL Ratio   Tobacco abuse    Continue Chantix Cutting back and ready to quit soon      Prediabetes    Well controlled Continue low carb diet      Relevant Orders   POCT glycosylated hemoglobin (Hb A1C) (Completed)   Other Visit Diagnoses     Need for pneumococcal vaccine       Relevant Orders   Pneumococcal conjugate vaccine 20-valent (Completed)   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       I,Essence Turner,acting as a Education administrator for Lavon Paganini, MD.,have documented all relevant documentation on the behalf of Lavon Paganini, MD,as directed by  Lavon Paganini, MD while in the presence of Lavon Paganini, MD.  I, Lavon Paganini, MD, have reviewed all documentation for this visit. The documentation on 09/12/20 for the exam, diagnosis, procedures, and orders are all accurate and complete.   Caidence Higashi, Dionne Bucy, MD, MPH [facility]

## 2020-09-12 NOTE — Assessment & Plan Note (Signed)
Continue Chantix Cutting back and ready to quit soon

## 2020-09-12 NOTE — Assessment & Plan Note (Signed)
Previously well controlled Continue statin Repeat FLP and CMP Goal LDL < 70 

## 2020-09-12 NOTE — Assessment & Plan Note (Signed)
Well controlled Continue low carb diet

## 2020-09-13 LAB — COMPREHENSIVE METABOLIC PANEL
ALT: 17 IU/L (ref 0–44)
AST: 17 IU/L (ref 0–40)
Albumin/Globulin Ratio: 1.5 (ref 1.2–2.2)
Albumin: 4.4 g/dL (ref 3.8–4.8)
Alkaline Phosphatase: 84 IU/L (ref 44–121)
BUN/Creatinine Ratio: 26 — ABNORMAL HIGH (ref 10–24)
BUN: 20 mg/dL (ref 8–27)
Bilirubin Total: 0.3 mg/dL (ref 0.0–1.2)
CO2: 26 mmol/L (ref 20–29)
Calcium: 9.9 mg/dL (ref 8.6–10.2)
Chloride: 97 mmol/L (ref 96–106)
Creatinine, Ser: 0.77 mg/dL (ref 0.76–1.27)
Globulin, Total: 3 g/dL (ref 1.5–4.5)
Glucose: 89 mg/dL (ref 65–99)
Potassium: 4.4 mmol/L (ref 3.5–5.2)
Sodium: 138 mmol/L (ref 134–144)
Total Protein: 7.4 g/dL (ref 6.0–8.5)
eGFR: 101 mL/min/{1.73_m2} (ref >59)

## 2020-09-13 LAB — LIPID PANEL WITH LDL/HDL RATIO
Cholesterol, Total: 142 mg/dL (ref 100–199)
HDL: 52 mg/dL (ref >39)
LDL Chol Calc (NIH): 70 mg/dL (ref 0–99)
LDL/HDL Ratio: 1.3 ratio (ref 0.0–3.6)
Triglycerides: 111 mg/dL (ref 0–149)
VLDL Cholesterol Cal: 20 mg/dL (ref 5–40)

## 2020-09-18 ENCOUNTER — Ambulatory Visit: Payer: Medicaid Other | Admitting: Podiatry

## 2020-09-18 ENCOUNTER — Other Ambulatory Visit: Payer: Self-pay

## 2020-09-18 ENCOUNTER — Encounter: Payer: Self-pay | Admitting: Podiatry

## 2020-09-18 DIAGNOSIS — M7751 Other enthesopathy of right foot: Secondary | ICD-10-CM | POA: Diagnosis not present

## 2020-09-18 DIAGNOSIS — Z01818 Encounter for other preprocedural examination: Secondary | ICD-10-CM | POA: Diagnosis not present

## 2020-09-18 DIAGNOSIS — M216X1 Other acquired deformities of right foot: Secondary | ICD-10-CM
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
## 2020-09-29 ENCOUNTER — Other Ambulatory Visit: Payer: Self-pay | Admitting: Family Medicine

## 2020-10-09 ENCOUNTER — Ambulatory Visit: Payer: Self-pay | Admitting: *Deleted

## 2020-10-09 NOTE — Telephone Encounter (Signed)
Positive home covid results this morning. Today is day 3 of symptoms.Fever now 102.1 with a scan thermometer, HA and muscle aches. Denies SOB/CP/cough. Discussed isolation period, health precautions, increasing water intake. OTC tylenol/ibuprofen for discomfort/fever. Monitor your breathing for any changes like SOB/CP/wheezing. Any concerns call back or seek treatment at the ED immediately.Patient is not interested in medication treatment at this time.      Reason for Disposition  [1] COVID-19 diagnosed by positive lab test (e.g., PCR, rapid self-test kit) AND [2] mild symptoms (e.g., cough, fever, others) AND [8] no complications or SOB  Answer Assessment - Initial Assessment Questions 1. COVID-19 DIAGNOSIS: "Who made your COVID-19 diagnosis?" "Was it confirmed by a positive lab test or self-test?" If not diagnosed by a doctor (or NP/PA), ask "Are there lots of cases (community spread) where you live?" Note: See public health department website, if unsure.    Home covid positive today 2. COVID-19 EXPOSURE: "Was there any known exposure to COVID before the symptoms began?" CDC Definition of close contact: within 6 feet (2 meters) for a total of 15 minutes or more over a 24-hour period.      no 3. ONSET: "When did the COVID-19 symptoms start?"      Today is day 3 4. WORST SYMPTOM: "What is your worst symptom?" (e.g., cough, fever, shortness of breath, muscle aches)     Body aches 5. COUGH: "Do you have a cough?" If Yes, ask: "How bad is the cough?"       no 6. FEVER: "Do you have a fever?" If Yes, ask: "What is your temperature, how was it measured, and when did it start?"     Yes,  7. RESPIRATORY STATUS: "Describe your breathing?" (e.g., shortness of breath, wheezing, unable to speak)      normal 8. BETTER-SAME-WORSE: "Are you getting better, staying the same or getting worse compared to yesterday?"  If getting worse, ask, "In what way?"     Today is the worst 9. HIGH RISK DISEASE: "Do you  have any chronic medical problems?" (e.g., asthma, heart or lung disease, weak immune system, obesity, etc.)     no 10. VACCINE: "Have you had the COVID-19 vaccine?" If Yes, ask: "Which one, how many shots, when did you get it?"       None on file 11. BOOSTER: "Have you received your COVID-19 booster?" If Yes, ask: "Which one and when did you get it?"       no 12. PREGNANCY: "Is there any chance you are pregnant?" "When was your last menstrual period?"       Na 13. OTHER SYMPTOMS: "Do you have any other symptoms?"  (e.g., chills, fatigue, headache, loss of smell or taste, muscle pain, sore throat)       Chills, fever, muscle pain 14. O2 SATURATION MONITOR:  "Do you use an oxygen saturation monitor (pulse oximeter) at home?" If Yes, ask "What is your reading (oxygen level) today?" "What is your usual oxygen saturation reading?" (e.g., 95%)       no  Protocols used: Coronavirus (COVID-19) Diagnosed or Suspected-A-AH

## 2020-10-13 ENCOUNTER — Ambulatory Visit: Payer: Self-pay

## 2020-10-13 NOTE — Telephone Encounter (Signed)
Reason for Disposition  4 or more ulcers  Answer Assessment - Initial Assessment Questions 1. MOUTH SORES (ULCERS): "Are there any sores in the mouth?" If so, ask: "What do they look like?" "Where are they located?"     yes 2.  APPEARANCE of RASH: "Describe the rash." (e.g., spots, blisters, raised areas, skin peeling, scaly)     Clusted lesions 3. LOCATION: "Where is the rash located?"      Mouth lips  4. COLOR: "What color is the rash?" Note: It is difficult to assess rash color in people with darker-colored skin. When this situation occurs, simply ask the caller to describe what they see.     red 5. ONSET: "When did the rash begin?"     After COVID diagnosis 6. FEVER: "Do you have a fever?" If Yes, ask: "What is your temperature, how was it measured, and when did it start?"     Last week 7. HAND FOOT AND MOUTH DISEASE EXPOSURE: "Has there been any exposure to Hand Foot and Mouth Disease within the past week?" If Yes, ask: "What type of contact occurred?"     unknown 8. MEDICATION FACTORS: "Have you started any new medications within the last 2 weeks?" (e.g., antibiotics)      no 9. BETTER-SAME-WORSE: "Are you getting better, staying the same or getting worse compared to yesterday?"  If getting worse, ask, "In what way?"     worse 10. OTHER SYMPTOMS: "Do you have any other symptoms?" (e.g., dizziness, headache, joint pain, shortness of breath, sore throat, weakness)       COVID 11. PREGNANCY: "Is there any chance you are pregnant?" "When was your last menstrual period?"       na  Answer Assessment - Initial Assessment Questions 1. LOCATION: "Where is the ulcer located?"      Mouth and lips 2. NUMBER: "How many ulcers are there?"      many 3. SIZE: "How large is the ulcer?"      small 4. SEVERITY: "Are they painful?" If Yes, ask: "How bad is it?"  (Scale 1-10; or mild, moderate, severe)  - MILD - eating  and drinking normally   - MODERATE - decreased liquid intake   - SEVERE -  drinking very little      moderate 5. ONSET: "When did you first notice the ulcer?"      With COVID diagnosis 6. RECURRENT SYMPTOM: "Have you had a mouth ulcer before?" If Yes, ask: "When was the last time?" and "What happened that time?"      no 7. CAUSE: "What do you think is causing the mouth ulcer?"     unknown 8. OTHER SYMPTOMS: "Do you have any other symptoms?" (e.g., fever)     no 9. PREGNANCY: "Is there any chance you are pregnant?" "When was your last menstrual period?"     na  Protocols used: Hand Foot and Mouth Disease - Diagnosed or Suspected-A-AH, Mouth Ulcers-A-AH

## 2020-10-13 NOTE — Telephone Encounter (Signed)
Returned Pt call.  Pt has sores inside and outside of his mouth that are painful.  Sore stared last week 10/07/2020 when he was diagnosed with covid.   Unclear if sore are related to COVID, allergies or bacterial infection.  Made virtual appointment for tomorrow.

## 2020-10-14 ENCOUNTER — Telehealth: Payer: Medicaid Other | Admitting: Family Medicine

## 2020-10-14 ENCOUNTER — Encounter: Payer: Self-pay | Admitting: Family Medicine

## 2020-10-14 DIAGNOSIS — B009 Herpesviral infection, unspecified: Secondary | ICD-10-CM

## 2020-10-14 MED ORDER — TRIAMCINOLONE ACETONIDE 0.5 % EX OINT
1.0000 | TOPICAL_OINTMENT | Freq: Two times a day (BID) | CUTANEOUS | 0 refills | Status: DC
Start: 2020-10-14 — End: 2021-10-28

## 2020-10-14 MED ORDER — VALACYCLOVIR HCL 1 G PO TABS
1000.0000 mg | ORAL_TABLET | Freq: Two times a day (BID) | ORAL | 0 refills | Status: AC
Start: 2020-10-14 — End: 2020-10-24

## 2020-10-14 NOTE — Assessment & Plan Note (Signed)
Outbreak of HSV lesions surround mouth, and in beard Has tried orajel Denies HFM s/s Recently had covid- has recovered

## 2020-10-14 NOTE — Progress Notes (Signed)
MyChart Video Visit    Virtual Visit via Video Note   This visit type was conducted due to national recommendations for restrictions regarding the COVID-19 Pandemic (e.g. social distancing) in an effort to limit this patient's exposure and mitigate transmission in our community. This patient is at least at moderate risk for complications without adequate follow up. This format is felt to be most appropriate for this patient at this time. Physical exam was limited by quality of the video and audio technology used for the visit.   Patient location: home Provider location: [facility]  I discussed the limitations of evaluation and management by telemedicine and the availability of in person appointments. The patient expressed understanding and agreed to proceed.  Patient: Caleb [name]   DOB: 26-Apr-1955   65 y.o. Male  MRN: [id number] Visit Date: 10/14/2020  Today's healthcare provider: Gwyneth Sprout, FNP   Chief Complaint  Patient presents with   Skin Problem    Patient presents today with concerns of blisters that have appeared around patients mouth and inside his cheeks, patient reports that blisters have been present for a week and states that they have been painful. Patient states that he went to pharmacy yesterday and was advised by pharmacist to take [place] for relief. Patient states that he sleeps with a bi-pap machine and has concerns that he broke out with blisters from face mask he wears with machine.    Subjective    HPI HPI     Skin Problem    Additional comments: Patient presents today with concerns of blisters that have appeared around patients mouth and inside his cheeks, patient reports that blisters have been present for a week and states that they have been painful. Patient states that he went to pharmacy yesterday and was advised by pharmacist to take [place] for relief. Patient states that he sleeps with a bi-pap machine and has concerns  that he broke out with blisters from face mask he wears with machine.       Last edited by Minette Headland, CMA on 10/14/2020  9:31 AM.        No albuterol since COVID- has if needed Using anoro daily, has been watching breathing Filter issue with mask; now aware, has been sleeping better than has has in years Orajel- just numbed   Medications: Outpatient Medications Prior to Visit  Medication Sig   albuterol (VENTOLIN HFA) 108 (90 Base) MCG/ACT inhaler Inhale 2 puffs into the lungs every 6 (six) hours as needed for wheezing or shortness of breath.   ANORO ELLIPTA 62.5-25 MCG/INH AEPB INHALE 1 PUFF BY MOUTH EVERY DAY   aspirin EC 81 MG tablet Take 81 mg by mouth daily.    cetirizine (ZYRTEC) 10 MG tablet Take 1 tablet (10 mg total) by mouth daily.   fluocinolone (SYNALAR) 0.01 % external solution Apply topically 2 (two) times daily. 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   hydrochlorothiazide (HYDRODIURIL) 25 MG tablet Take 1 tablet (25 mg total) by mouth daily.   HYDROcodone-acetaminophen (NORCO) 7.5-325 MG tablet Take 1 tablet by mouth every 6 (six) hours as needed for severe pain. Must last 30 days.   [START ON 10/28/2020] HYDROcodone-acetaminophen (NORCO) 7.5-325 MG tablet Take 1 tablet by mouth every 6 (six) hours as needed for severe  pain. Must last 30 days.   ibuprofen (ADVIL) 800 MG tablet Take 1 tablet (800 mg total) by mouth every 6 (six) hours as needed.   isosorbide mononitrate (IMDUR) 30 MG 24 hr tablet TAKE 0.5 TABLETS (15 MG TOTAL) BY MOUTH DAILY.   losartan (COZAAR) 100 MG tablet Take 1 tablet (100 mg total) by mouth daily.   Melatonin 10 MG CAPS Take 20 mg by mouth at bedtime.   methocarbamol (ROBAXIN) 750 MG tablet Take 1 tablet (750 mg total) by mouth every 8 (eight) hours as needed  for muscle spasms.   mometasone (ELOCON) 0.1 % lotion APPLY 1 ML ONTO THE SKIN DAILY FOR PSORIASIS   montelukast (SINGULAIR) 10 MG tablet Take 1 tablet (10 mg total) by mouth at bedtime.   naloxone (NARCAN) nasal spray 4 mg/0.1 mL Use in case of opioid overdose   nitroGLYCERIN (NITROSTAT) 0.4 MG SL tablet Place 1 tablet (0.4 mg total) under the tongue every 5 (five) minutes as needed for chest pain.   pregabalin (LYRICA) 150 MG capsule Take 1 capsule (150 mg total) by mouth 3 (three) times daily.   rosuvastatin (CRESTOR) 40 MG tablet TAKE 1 TABLET BY MOUTH EVERY DAY   TURMERIC PO Take 1 capsule by mouth daily.   varenicline (CHANTIX) 1 MG tablet Take 1 tablet (1 mg total) by mouth 2 (two) times daily.   HYDROcodone-acetaminophen (NORCO) 7.5-325 MG tablet Take 1 tablet by mouth every 6 (six) hours as needed for severe pain. Must last 30 days.   No facility-administered medications prior to visit.    Review of Systems    Objective    There were no vitals taken for this visit.   Physical Exam Constitutional:      Appearance: Normal appearance.  Pulmonary:     Effort: Pulmonary effort is normal.  Skin:    General: Skin is dry.     Findings: Rash present.     Comments: Erosive blisters surrounds mouth  Neurological:     General: No focal deficit present.     Mental Status: He is alert.  Psychiatric:        Mood and Affect: Mood normal.        Behavior: Behavior normal.        Thought Content: Thought content normal.        Judgment: Judgment normal.       Assessment & Plan     Problem List Items Addressed This Visit       Other   HSV-1 infection - Primary    Outbreak of HSV lesions surround mouth, and in beard Has tried orajel Denies HFM s/s Recently had covid- has recovered         No follow-ups on file.     I discussed the assessment and treatment plan with the patient. The patient was provided an opportunity to ask questions and all were answered. The  patient agreed with the plan and demonstrated an understanding of the instructions.   The patient was advised to call back or seek an in-person evaluation if the symptoms worsen or if the condition fails to improve as anticipated.  I provided 11 minutes of non-face-to-face time during this encounter.  Vonna Kotyk, FNP, have reviewed all documentation for this visit. The documentation on 10/14/20 for the exam, diagnosis, procedures, and orders are all accurate and complete.   Gwyneth Sprout, [place] [phone] (phone) [phone] (fax)  [place]

## 2020-10-15 ENCOUNTER — Telehealth: Payer: Self-pay | Admitting: Urology

## 2020-10-15 NOTE — Telephone Encounter (Signed)
DOS - 10/27/20  METATARSAL OSTEOTOMY 5TH RIGHT --- 28308  HEALTHY BLUE EFFECTIVE DATE - 07/04/20  PLAN DEDUCTIBLE - $0.00 OUT OF POCKET - $0.00 COINSURANCE - 0% COPAY - $3.00  PER [employer] SITE FOR CPT CODE 68166 HAS BEEN APPROVED, AUTH # [id number], GOOD FROM 10/27/20 - 12/25/20.  REF # [id number]

## 2020-10-16 ENCOUNTER — Other Ambulatory Visit: Payer: Self-pay

## 2020-10-16 ENCOUNTER — Encounter: Payer: Self-pay | Admitting: Urology

## 2020-10-16 ENCOUNTER — Other Ambulatory Visit: Payer: Self-pay | Admitting: Family Medicine

## 2020-10-16 ENCOUNTER — Ambulatory Visit: Payer: Medicaid Other | Admitting: Urology

## 2020-10-16 VITALS — BP 101/59 | HR 65 | Ht 68.0 in | Wt 194.0 lb

## 2020-10-16 DIAGNOSIS — N200 Calculus of kidney: Secondary | ICD-10-CM

## 2020-10-16 DIAGNOSIS — N529 Male erectile dysfunction, unspecified: Secondary | ICD-10-CM

## 2020-10-16 DIAGNOSIS — M7918 Myalgia, other site: Secondary | ICD-10-CM

## 2020-10-16 DIAGNOSIS — G8929 Other chronic pain: Secondary | ICD-10-CM

## 2020-10-16 LAB — URINALYSIS, COMPLETE
Bilirubin, UA: NEGATIVE
Glucose, UA: NEGATIVE
Ketones, UA: NEGATIVE
Leukocytes,UA: NEGATIVE
Nitrite, UA: NEGATIVE
Protein,UA: NEGATIVE
RBC, UA: NEGATIVE
Specific Gravity, UA: 1.02 (ref 1.005–1.030)
Urobilinogen, Ur: 0.2 mg/dL (ref 0.2–1.0)
pH, UA: 7 (ref 5.0–7.5)

## 2020-10-16 LAB — MICROSCOPIC EXAMINATION
Bacteria, UA: NONE SEEN
Epithelial Cells (non renal): NONE SEEN /hpf (ref 0–10)
RBC, Urine: NONE SEEN /hpf (ref 0–2)

## 2020-10-16 MED ORDER — SILDENAFIL CITRATE 20 MG PO TABS: 20.0000 mg | ORAL_TABLET | ORAL | 11 refills | Status: DC | PRN

## 2020-10-16 NOTE — Patient Instructions (Signed)
Sildenafil Tablets (Erectile Dysfunction) What is this medication? SILDENAFIL (sil DEN a fil) treats erectile dysfunction (ED). It works by increasing blood flow to the penis, which helps to maintain an erection. This medicine may be used for other purposes; ask your health care provider or pharmacist if you have questions. COMMON BRAND NAME(S): Viagra What should I tell my care team before I take this medication? They need to know if you have any of these conditions: Bleeding disorders Eye or vision problems, including a rare inherited eye disease called retinitis pigmentosa Anatomical deformation of the penis, Peyronie's disease, or history of priapism (painful and prolonged erection) Heart disease, angina, a history of heart attack, irregular heartbeats, or other heart problems High or low blood pressure History of blood diseases, like sickle cell anemia or leukemia History of stomach bleeding Kidney disease Liver disease Stroke An unusual or allergic reaction to sildenafil, other medications, foods, dyes, or preservatives Pregnant or trying to get pregnant Breast-feeding How should I use this medication? Take this medication by mouth with a glass of water. Follow the directions on the prescription label. The dose is usually taken 1 hour before sexual activity. You should not take the dose more than once per day. Do not take your medication more often than directed. Talk to your care team about the use of this medication in children. This medication is not used in children for this condition. Overdosage: If you think you have taken too much of this medicine contact a poison control center or emergency room at once. NOTE: This medicine is only for you. Do not share this medicine with others. What if I miss a dose? This does not apply. Do not take double or extra doses. What may interact with this medication? Do not take this medication with any of the following: Cisapride Nitrates  like amyl nitrite, isosorbide dinitrate, isosorbide mononitrate, nitroglycerin Riociguat This medication may also interact with the following: Antiviral medications for HIV or AIDS Bosentan Certain medications for benign prostatic hyperplasia (BPH) Certain medications for blood pressure Certain medications for fungal infections like ketoconazole and itraconazole Cimetidine Erythromycin Rifampin This list may not describe all possible interactions. Give your health care provider a list of all the medicines, herbs, non-prescription drugs, or dietary supplements you use. Also tell them if you smoke, drink alcohol, or use illegal drugs. Some items may interact with your medicine. What should I watch for while using this medication? If you notice any changes in your vision while taking this medication, call your care team as soon as possible. Stop using this medication and call your care team right away if you have a loss of sight in one or both eyes. Contact your care team right away if you have an erection that lasts longer than 4 hours or if it becomes painful. This may be a sign of a serious problem and must be treated right away to prevent permanent damage. If you experience symptoms of nausea, dizziness, chest pain or arm pain upon initiation of sexual activity after taking this medication, you should refrain from further activity and call your care team as soon as possible. Do not drink alcohol to excess (examples, 5 glasses of wine or 5 shots of whiskey) when taking this medication. When taken in excess, alcohol can increase your chances of getting a headache or getting dizzy, increasing your heart rate or lowering your blood pressure. Using this medication does not protect you or your partner against HIV infection (the virus that causes AIDS)  or other sexually transmitted diseases. What side effects may I notice from receiving this medication? Side effects that you should report to your care  team as soon as possible: Allergic reactions-skin rash, itching, hives, swelling of the face, lips, tongue, or throat Hearing loss or ringing in ears Heart attack-pain or tightness in the chest, shoulders, arms, or jaw, nausea, shortness of breath, cold or clammy skin, feeling faint or lightheaded Heart rhythm changes-fast or irregular heartbeat, dizziness, feeling faint or lightheaded, chest pain, trouble breathing Low blood pressure-dizziness, feeling faint or lightheaded, blurry vision New or worsening shortness of breath Prolonged or painful erection Stroke-sudden numbness or weakness of the face, arm, or leg, trouble speaking, confusion, trouble walking, loss of balance or coordination, dizziness, severe headache, change in vision Sudden vision loss in one or both eyes Side effects that usually do not require medical attention (report to your care team if they continue or are bothersome): Facial flushing or redness Headache Nosebleed Runny or stuffy nose Trouble sleeping Upset stomach This list may not describe all possible side effects. Call your doctor for medical advice about side effects. You may report side effects to FDA at 1-800-FDA-1088. Where should I keep my medication? Keep out of reach of children and pets. Store at room temperature between 15 and 30 degrees C (59 and 86 degrees F). Throw away any unused medication after the expiration date. NOTE: This sheet is a summary. It may not cover all possible information. If you have questions about this medicine, talk to your doctor, pharmacist, or health care provider.  2022 Elsevier/Gold Standard (2020-01-24 13:57:21)

## 2020-10-16 NOTE — Progress Notes (Signed)
   10/16/2020 8:42 AM   Caleb [name] [birth year] [id number]  Reason for visit: Follow up nephrolithiasis, ejaculatory dysfunction, ED  HPI: 65 year old comorbid male with chronic pain on narcotics and the above urologic issues.  He had a CT in February 2022 for left lower quadrant pain which showed no hydronephrosis, nonobstructive left lower pole stones, but no other abnormalities.  I personally viewed and interpreted those images.  He continues to have some left lower quadrant pain, but he states this feels different than kidney stones in the past and he thinks it could be diverticulitis.  He is planning to reach out to his PCP to request a CT in the next few days.  He denies any hematuria or urinary symptoms.  Urinalysis today pending.  Regarding his erections, he previously was on Cialis 20 mg as needed with good results, however started to have significant headaches with that dose.  He never tried a lower dose.  He is interested in trying a different medication.  Nitro remains on his medication list, but he continues to insist that he is never taken this before.  I stressed at length the importance of not taking PDE 5 inhibitors with nitro secondary to risk of hypotension and syncope.  I recommended a trial of Revatio 20 to 100 mg on demand, and risks and benefits discussed.  If he continues to have headaches with the Revatio, could try the Cialis 5 mg as needed.  Other alternatives would be penile injections.  Trial of Revatio 2100 mg on demand for ED Will call with urinalysis results He will reach out to his PCP regarding CT for evaluation of possible diverticulitis, and I will review those images to evaluate for any stones   Billey Co, MD  [facility] [address], [place], [place] [address] [phone]

## 2020-10-16 NOTE — Telephone Encounter (Signed)
Requested medication (s) are due for refill today: Due 10/24/20  Requested medication (s) are on the active medication list: Yes  Last refill: 07/24/20  #90  2 refills  Future visit scheduled no  Notes to clinic: not delegated  Requested Prescriptions  Pending Prescriptions Disp Refills   methocarbamol (ROBAXIN) 750 MG tablet [Pharmacy Med Name: METHOCARBAMOL 750 MG TABLET] 90 tablet 2    Sig: Take 1 tablet (750 mg total) by mouth every 8 (eight) hours as needed for muscle spasms.     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   [facility] Gwyneth Sprout, FNP   1 month ago Essential hypertension   [facility] [place], Dionne Bucy, MD   2 months ago Lateral epicondylitis of right elbow   [facility], Dionne Bucy, MD   6 months ago Encounter for annual physical exam   [facility], Dionne Bucy, MD   7 months ago Fever, unspecified fever cause   [facility], Dionne Bucy, MD       Future Appointments             In 1 month Laurin Coder, MD [facility]   In 3 months Gollan, Kathlene November, MD [facility]   In 5 months Bacigalupo, Dionne Bucy, MD [facility] [place], [place]   In 1 year Varnado, Herbert Seta, [facility]

## 2020-10-17 ENCOUNTER — Telehealth: Payer: Self-pay

## 2020-10-17 NOTE — Telephone Encounter (Signed)
-----   Message from Billey Co, MD sent at 10/16/2020  5:01 PM EDT ----- Urinalysis completely normal, do not suspect kidney stone is the cause of his pain.  Would recommend reaching out to his PCP to consider CT to evaluate for diverticulitis  Nickolas Madrid, MD 10/16/2020

## 2020-10-17 NOTE — Telephone Encounter (Signed)
Patient notified and verbalized understanding. 

## 2020-10-27 ENCOUNTER — Encounter: Payer: Self-pay | Admitting: Podiatry

## 2020-10-27 ENCOUNTER — Other Ambulatory Visit: Payer: Self-pay | Admitting: Podiatry

## 2020-10-27 DIAGNOSIS — M205X1 Other deformities of toe(s) (acquired), right foot: Secondary | ICD-10-CM | POA: Diagnosis not present

## 2020-10-27 DIAGNOSIS — M21541 Acquired clubfoot, right foot: Secondary | ICD-10-CM | POA: Diagnosis not present

## 2020-10-27 MED ORDER — HYDROCODONE-ACETAMINOPHEN 5-325 MG PO TABS: 1.0000 | ORAL_TABLET | Freq: Four times a day (QID) | ORAL | 0 refills | Status: DC | PRN

## 2020-10-27 MED ORDER — IBUPROFEN 800 MG PO TABS: 800.0000 mg | ORAL_TABLET | Freq: Four times a day (QID) | ORAL | 1 refills | Status: DC | PRN

## 2020-10-28 ENCOUNTER — Telehealth: Payer: Self-pay | Admitting: *Deleted

## 2020-10-28 NOTE — Telephone Encounter (Signed)
"  I had surgery yesterday.  This pain medication is not helping."

## 2020-10-29 ENCOUNTER — Other Ambulatory Visit: Payer: Self-pay | Admitting: Pulmonary Disease

## 2020-10-29 ENCOUNTER — Other Ambulatory Visit: Payer: Self-pay | Admitting: Podiatry

## 2020-10-29 MED ORDER — KETOROLAC TROMETHAMINE 10 MG PO TABS: 10.0000 mg | ORAL_TABLET | Freq: Four times a day (QID) | ORAL | 0 refills | Status: DC | PRN

## 2020-10-29 NOTE — Telephone Encounter (Signed)
I called and informed Caleb [name] that Dr. Posey Pronto called in a prescription to his pharmacy for Toradol.  I informed him that Dr. Posey Pronto wants him to take the medication in between his dosages of Norco for the breakthrough pain.  He said he picked up the medication this morning.

## 2020-10-30 ENCOUNTER — Ambulatory Visit: Payer: Self-pay | Admitting: Urology

## 2020-10-31 ENCOUNTER — Ambulatory Visit: Payer: Self-pay

## 2020-10-31 ENCOUNTER — Other Ambulatory Visit: Payer: Self-pay | Admitting: Pulmonary Disease

## 2020-10-31 ENCOUNTER — Telehealth: Payer: Self-pay | Admitting: Pulmonary Disease

## 2020-10-31 ENCOUNTER — Telehealth: Payer: Self-pay | Admitting: Podiatry

## 2020-10-31 MED ORDER — DOXYCYCLINE HYCLATE 100 MG PO TABS: 100.0000 mg | ORAL_TABLET | Freq: Two times a day (BID) | ORAL | 0 refills | Status: DC

## 2020-10-31 MED ORDER — PREDNISONE 20 MG PO TABS: 40.0000 mg | ORAL_TABLET | Freq: Every day | ORAL | 0 refills | Status: AC

## 2020-10-31 NOTE — Telephone Encounter (Signed)
URGENT-patient called LM on RN V line that he had surgery last week and his toes are black now. Patient wanted to know what he shouyld do. Please call patient.

## 2020-10-31 NOTE — Telephone Encounter (Signed)
I will call in a course of antibiotics and steroids  Monitor symptoms closely If feeling any worse then you should seek urgent help

## 2020-10-31 NOTE — Telephone Encounter (Signed)
Pt states he has had shortness of breath the last week. Pt states he lifted a piece of wood(his job)and didn't think he'd make it. Pt did call pcp and they advised him to call our office. Had covid about 3 weeks ago for 2 weeks, pt doesn't have a fever.Please advise [phone]

## 2020-10-31 NOTE — Telephone Encounter (Signed)
Patient is aware of recommendations and voiced his understanding.  Nothing further needed. 

## 2020-10-31 NOTE — Telephone Encounter (Signed)
Pt. Reports he has had some shortness of breath this week, "worse today . If I breath through my mouth it's better." Reports earlier this year "I was exposed to black mold and I been having issues since then." No availability in the practice today. States he will call his pulmonologist.    Reason for Disposition  [1] MILD difficulty breathing (e.g., minimal/no SOB at rest, SOB with walking, pulse <100) AND [2] NEW-onset or WORSE than normal  Answer Assessment - Initial Assessment Questions 1. RESPIRATORY STATUS: "Describe your breathing?" (e.g., wheezing, shortness of breath, unable to speak, severe coughing)      Shortness of breath 2. ONSET: "When did this breathing problem begin?"      This week 3. PATTERN "Does the difficult breathing come and go, or has it been constant since it started?"      Comes and goes 4. SEVERITY: "How bad is your breathing?" (e.g., mild, moderate, severe)    - MILD: No SOB at rest, mild SOB with walking, speaks normally in sentences, can lie down, no retractions, pulse < 100.    - MODERATE: SOB at rest, SOB with minimal exertion and prefers to sit, cannot lie down flat, speaks in phrases, mild retractions, audible wheezing, pulse 100-120.    - SEVERE: Very SOB at rest, speaks in single words, struggling to breathe, sitting hunched forward, retractions, pulse > 120      Mild - moderate 5. RECURRENT SYMPTOM: "Have you had difficulty breathing before?" If Yes, ask: "When was the last time?" and "What happened that time?"      Yes 6. CARDIAC HISTORY: "Do you have any history of heart disease?" (e.g., heart attack, angina, bypass surgery, angioplasty)      Yes 7. LUNG HISTORY: "Do you have any history of lung disease?"  (e.g., pulmonary embolus, asthma, emphysema)     COPD 8. CAUSE: "What do you think is causing the breathing problem?"      uNSURE 9. OTHER SYMPTOMS: "Do you have any other symptoms? (e.g., dizziness, runny nose, cough, chest pain, fever)     No 10.  O2 SATURATION MONITOR:  "Do you use an oxygen saturation monitor (pulse oximeter) at home?" If Yes, "What is your reading (oxygen level) today?" "What is your usual oxygen saturation reading?" (e.g., 95%)       No 11. PREGNANCY: "Is there any chance you are pregnant?" "When was your last menstrual period?"       N/a 12. TRAVEL: "Have you traveled out of the country in the last month?" (e.g., travel history, exposures)       No  Protocols used: Breathing Difficulty-A-AH

## 2020-10-31 NOTE — Progress Notes (Signed)
Prescription for doxycycline and prednisone sent into pharmacy

## 2020-10-31 NOTE — Telephone Encounter (Signed)
Called and spoke to patient.  Patient reports of increased sob with exertion, prod cough with green sputum and decreased energy.  He is not using albuterol. He is using anoro daily. He does not supplemental oxygen or monitor his oxygen levels.  Denies f/c/s or additional sx. He stated that he left a log off of his truck today and developed severe sob. He stated that he did not think he was "going to make it"  Dr. Ander Slade, please advise. Thanks

## 2020-11-01 ENCOUNTER — Emergency Department
Admission: EM | Admit: 2020-11-01 | Discharge: 2020-11-01 | Disposition: A | Discharge status: Home / Self Care | Payer: Medicaid Other | Attending: Emergency Medicine | Admitting: Emergency Medicine

## 2020-11-01 ENCOUNTER — Other Ambulatory Visit: Payer: Self-pay

## 2020-11-01 DIAGNOSIS — S90129A Contusion of unspecified lesser toe(s) without damage to nail, initial encounter: Secondary | ICD-10-CM

## 2020-11-01 DIAGNOSIS — J449 Chronic obstructive pulmonary disease, unspecified: Secondary | ICD-10-CM | POA: Diagnosis not present

## 2020-11-01 DIAGNOSIS — Y838 Other surgical procedures as the cause of abnormal reaction of the patient, or of later complication, without mention of misadventure at the time of the procedure: Secondary | ICD-10-CM | POA: Diagnosis not present

## 2020-11-01 DIAGNOSIS — Z7982 Long term (current) use of aspirin: Secondary | ICD-10-CM | POA: Insufficient documentation

## 2020-11-01 DIAGNOSIS — S90411A Abrasion, right great toe, initial encounter: Secondary | ICD-10-CM | POA: Diagnosis not present

## 2020-11-01 DIAGNOSIS — I10 Essential (primary) hypertension: Secondary | ICD-10-CM | POA: Insufficient documentation

## 2020-11-01 DIAGNOSIS — I2510 Atherosclerotic heart disease of native coronary artery without angina pectoris: Secondary | ICD-10-CM | POA: Diagnosis not present

## 2020-11-01 DIAGNOSIS — F17210 Nicotine dependence, cigarettes, uncomplicated: Secondary | ICD-10-CM | POA: Diagnosis not present

## 2020-11-01 DIAGNOSIS — M96840 Postprocedural hematoma of a musculoskeletal structure following a musculoskeletal system procedure: Secondary | ICD-10-CM | POA: Diagnosis not present

## 2020-11-01 NOTE — ED Triage Notes (Signed)
Pt via POV pt had surgery on Monday, state toes on his R foot are now numb. Pt has strong pulse in R foot. Bruising noted to the R second and third toe. Pt is A&Ox4 and NAD.
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
## 2020-11-01 NOTE — Discharge Instructions (Addendum)
You were seen today for discoloration of your toes on her right foot.  You have good blood flow to your toes.  This looks like dependent bruising.  This should resolve with time and nothing to be concerned about.  Please follow-up with your podiatrist on Tuesday as previously scheduled.

## 2020-11-03 ENCOUNTER — Telehealth: Payer: Self-pay

## 2020-11-03 NOTE — Telephone Encounter (Signed)
Transition Care Management Follow-up Telephone Call Date of discharge and from where: 11/01/2020-[facility] How have you been since you were released from the hospital? Patient stated he is doing much better.  Any questions or concerns? No  Items Reviewed: Did the pt receive and understand the discharge instructions provided? Yes  Medications obtained and verified? Yes  Other? No  Any new allergies since your discharge? No  Dietary orders reviewed? No Do you have support at home? Yes   Home Care and Equipment/Supplies: Were home health services ordered? not applicable If so, what is the name of the agency? N/A  Has the agency set up a time to come to the patient's home? not applicable Were any new equipment or medical supplies ordered?  No What is the name of the medical supply agency? N/A Were you able to get the supplies/equipment? not applicable Do you have any questions related to the use of the equipment or supplies? No  Functional Questionnaire: (I = Independent and D = Dependent) ADLs: I  Bathing/Dressing- I  Meal Prep- I  Eating- I  Maintaining continence- I  Transferring/Ambulation- I  Managing Meds- I  Follow up appointments reviewed:  PCP Hospital f/u appt confirmed? No   Specialist Hospital f/u appt confirmed? Yes  Scheduled to see Podiatry on 11/04/2020 @ 2:45pm. Are transportation arrangements needed? No  If their condition worsens, is the pt aware to call PCP or go to the Emergency Dept.? Yes Was the patient provided with contact information for the PCP's office or ED? Yes Was to pt encouraged to call back with questions or concerns? Yes

## 2020-11-03 NOTE — Telephone Encounter (Signed)
Noted  

## 2020-11-04 ENCOUNTER — Ambulatory Visit (INDEPENDENT_AMBULATORY_CARE_PROVIDER_SITE_OTHER): Payer: Medicaid Other | Admitting: Podiatry

## 2020-11-04 ENCOUNTER — Ambulatory Visit (INDEPENDENT_AMBULATORY_CARE_PROVIDER_SITE_OTHER): Payer: Medicaid Other

## 2020-11-04 ENCOUNTER — Other Ambulatory Visit: Payer: Self-pay

## 2020-11-04 DIAGNOSIS — Z9889 Other specified postprocedural states: Secondary | ICD-10-CM

## 2020-11-04 DIAGNOSIS — M7751 Other enthesopathy of right foot: Secondary | ICD-10-CM

## 2020-11-04 DIAGNOSIS — M216X1 Other acquired deformities of right foot: Secondary | ICD-10-CM
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
## 2020-11-05 ENCOUNTER — Other Ambulatory Visit: Payer: Self-pay | Admitting: Family Medicine

## 2020-11-05 NOTE — Telephone Encounter (Signed)
Medication Refill - Medication: albuterol (VENTOLIN HFA) 108 (90 Base) MCG/ACT inhaler  Has the patient contacted their pharmacy? No. Pt states it has been a while since he took this and there are no refills/ pt stated his lung Dr wants him back on this inhaler / please advise   (Agent: If no, request that the patient contact the pharmacy for the refill. If patient does not wish to contact the pharmacy document the reason why and proceed with request.) (Agent: If yes, when and what did the pharmacy advise?)  Preferred Pharmacy (with phone number or street name): [id number] - [place], [place]  [address] [place] [address]  Phone:  [phone]  Fax:  [phone]  Has the patient been seen for an appointment in the last year OR does the patient have an upcoming appointment? Yes.    Agent: Please be advised that RX refills may take up to 3 business days. We ask that you follow-up with your pharmacy.

## 2020-11-10 ENCOUNTER — Telehealth: Payer: Self-pay | Admitting: Family Medicine

## 2020-11-10 DIAGNOSIS — N529 Male erectile dysfunction, unspecified: Secondary | ICD-10-CM

## 2020-11-10 NOTE — Telephone Encounter (Signed)
Patient called and states the Sildenafil is not working. He is using 5 at a time with no result. He is requesting to try another medication. Please advise.

## 2020-11-12 MED ORDER — TADALAFIL 5 MG PO TABS
ORAL_TABLET | ORAL | 11 refills | Status: DC
Start: 2020-11-12 — End: 2021-10-09

## 2020-11-12 NOTE — Addendum Note (Signed)
Addended by: Donalee Citrin on: 11/12/2020 09:44 AM   Modules accepted: Orders

## 2020-11-12 NOTE — Telephone Encounter (Signed)
Can try cialis 5-20mg  on demand for ED, if no improvement needs to see Larene Beach to discuss penile injections  Nickolas Madrid, MD 11/12/2020

## 2020-11-12 NOTE — Telephone Encounter (Signed)
Patient called back.  Information was provided to the patient.  He expressed understanding.

## 2020-11-12 NOTE — Telephone Encounter (Signed)
Called pt no answer. Left generic message for pt informing him that alternative medication has been sent into the pharmacy for him. Advised pt to call back for questions or concerns.

## 2020-11-18 ENCOUNTER — Encounter: Payer: Self-pay | Admitting: Pulmonary Disease

## 2020-11-18 ENCOUNTER — Ambulatory Visit (INDEPENDENT_AMBULATORY_CARE_PROVIDER_SITE_OTHER): Payer: Medicaid Other | Admitting: Podiatry

## 2020-11-18 ENCOUNTER — Ambulatory Visit: Payer: Medicaid Other | Admitting: Pulmonary Disease

## 2020-11-18 ENCOUNTER — Encounter: Payer: Self-pay | Admitting: Podiatry

## 2020-11-18 ENCOUNTER — Other Ambulatory Visit: Payer: Self-pay

## 2020-11-18 VITALS — BP 130/86 | HR 68 | Temp 97.7°F | Ht 68.0 in | Wt 199.0 lb

## 2020-11-18 DIAGNOSIS — Z9989 Dependence on other enabling machines and devices: Secondary | ICD-10-CM

## 2020-11-18 DIAGNOSIS — Z9889 Other specified postprocedural states: Secondary | ICD-10-CM

## 2020-11-18 DIAGNOSIS — M7671 Peroneal tendinitis, right leg: Secondary | ICD-10-CM

## 2020-11-18 DIAGNOSIS — G4733 Obstructive sleep apnea (adult) (pediatric): Secondary | ICD-10-CM | POA: Diagnosis not present

## 2020-11-18 NOTE — Patient Instructions (Signed)
Continue using the BiPAP on a nightly basis  Call us with any significant concerns  I will see you 6 months from here
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
## 2020-11-19 ENCOUNTER — Telehealth: Payer: Self-pay

## 2020-11-19 ENCOUNTER — Other Ambulatory Visit: Payer: Self-pay | Admitting: Family Medicine

## 2020-11-19 NOTE — Telephone Encounter (Signed)
Patient left message on triage line asking for another medication for ED. He states cialis is causing headaches.

## 2020-11-19 NOTE — Telephone Encounter (Signed)
Appointment scheduled with shannon for next week. Pt verbalized understanding.

## 2020-11-19 NOTE — Telephone Encounter (Signed)
Requested Prescriptions  Pending Prescriptions Disp Refills  . losartan (COZAAR) 100 MG tablet [Pharmacy Med Name: LOSARTAN POTASSIUM 100 MG TAB] 90 tablet 1    Sig: TAKE 1 TABLET BY MOUTH EVERY DAY     Cardiovascular:  Angiotensin Receptor Blockers Passed - 11/19/2020  1:38 PM      Passed - Cr in normal range and within 180 days    Creat  Date Value Ref Range Status  10/21/2016 0.98 0.70 - 1.33 mg/dL Final    Comment:    For patients >63 years of age, the reference limit for Creatinine is approximately 13% higher for people identified as African-American. .    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   [facility] Gwyneth Sprout, FNP   2 months ago Essential hypertension   [facility] [place], Dionne Bucy, MD   3 months ago Lateral epicondylitis of right elbow   [facility] [place], Dionne Bucy, MD   8 months ago Encounter for annual physical exam   [facility] [place], Dionne Bucy, MD   8 months ago Fever, unspecified fever cause   [facility], Dionne Bucy, MD      Future Appointments            In 1 week McGowan, Gordan Payment Cut Off   In 2 months Joseph, Kathlene November, MD [facility], [place]   In 4 months Bacigalupo, Dionne Bucy, MD [facility], [place]   In 78 months Diamantina [place], Herbert Seta, [facility]

## 2020-11-19 NOTE — Telephone Encounter (Signed)
Lm for pt to contact clinic.

## 2020-11-21 ENCOUNTER — Telehealth: Payer: Self-pay | Admitting: *Deleted

## 2020-11-21 NOTE — Telephone Encounter (Signed)
"  I seen Dr. Posey Pronto on Tuesday.  He took the stitches out of my foot and he gave me a cortisone shot in my foot.  It helped about two days, now it's killing me again.  He said something about putting me on steroids.  I don't know if that's something he wants to consider or not but I need something.  I can't hardly stand this.  If you would, get back with me."

## 2020-11-24 ENCOUNTER — Other Ambulatory Visit: Payer: Self-pay | Admitting: Podiatry

## 2020-11-24 ENCOUNTER — Encounter: Payer: Self-pay | Admitting: Pulmonary Disease

## 2020-11-24 MED ORDER — METHYLPREDNISOLONE 4 MG PO TBPK: ORAL_TABLET | ORAL | 0 refills | Status: DC

## 2020-11-26 ENCOUNTER — Ambulatory Visit: Payer: Medicaid Other | Admitting: Urology

## 2020-11-26 ENCOUNTER — Telehealth: Payer: Self-pay | Admitting: *Deleted

## 2020-11-26 ENCOUNTER — Telehealth: Payer: Self-pay

## 2020-11-26 ENCOUNTER — Other Ambulatory Visit: Payer: Self-pay

## 2020-11-26 ENCOUNTER — Encounter: Payer: Self-pay | Admitting: Pain Medicine

## 2020-11-26 ENCOUNTER — Ambulatory Visit: Payer: Medicaid Other | Attending: Pain Medicine | Admitting: Pain Medicine

## 2020-11-26 VITALS — BP 108/72 | HR 71 | Temp 97.5°F | Resp 18 | Ht 68.0 in | Wt 199.0 lb

## 2020-11-26 DIAGNOSIS — Z79899 Other long term (current) drug therapy: Secondary | ICD-10-CM | POA: Insufficient documentation

## 2020-11-26 DIAGNOSIS — M62838 Other muscle spasm: Secondary | ICD-10-CM | POA: Insufficient documentation

## 2020-11-26 DIAGNOSIS — M79671 Pain in right foot: Secondary | ICD-10-CM

## 2020-11-26 DIAGNOSIS — M961 Postlaminectomy syndrome, not elsewhere classified: Secondary | ICD-10-CM | POA: Diagnosis not present

## 2020-11-26 DIAGNOSIS — M533 Sacrococcygeal disorders, not elsewhere classified: Secondary | ICD-10-CM | POA: Insufficient documentation

## 2020-11-26 DIAGNOSIS — M5010 Cervical disc disorder with radiculopathy, unspecified cervical region: Secondary | ICD-10-CM | POA: Insufficient documentation

## 2020-11-26 DIAGNOSIS — Z79891 Long term (current) use of opiate analgesic: Secondary | ICD-10-CM | POA: Insufficient documentation

## 2020-11-26 DIAGNOSIS — M79602 Pain in left arm: Secondary | ICD-10-CM | POA: Insufficient documentation

## 2020-11-26 DIAGNOSIS — M25571 Pain in right ankle and joints of right foot: Secondary | ICD-10-CM | POA: Diagnosis not present

## 2020-11-26 DIAGNOSIS — M5030 Other cervical disc degeneration, unspecified cervical region: Secondary | ICD-10-CM | POA: Diagnosis not present

## 2020-11-26 DIAGNOSIS — M5137 Other intervertebral disc degeneration, lumbosacral region: Secondary | ICD-10-CM

## 2020-11-26 DIAGNOSIS — R937 Abnormal findings on diagnostic imaging of other parts of musculoskeletal system: Secondary | ICD-10-CM | POA: Diagnosis not present

## 2020-11-26 DIAGNOSIS — M546 Pain in thoracic spine: Secondary | ICD-10-CM | POA: Diagnosis not present

## 2020-11-26 DIAGNOSIS — M47816 Spondylosis without myelopathy or radiculopathy, lumbar region: Secondary | ICD-10-CM | POA: Diagnosis not present

## 2020-11-26 DIAGNOSIS — M542 Cervicalgia: Secondary | ICD-10-CM | POA: Insufficient documentation

## 2020-11-26 DIAGNOSIS — G894 Chronic pain syndrome: Secondary | ICD-10-CM | POA: Diagnosis not present

## 2020-11-26 DIAGNOSIS — M47812 Spondylosis without myelopathy or radiculopathy, cervical region: Secondary | ICD-10-CM | POA: Diagnosis not present

## 2020-11-26 DIAGNOSIS — G8929 Other chronic pain: Secondary | ICD-10-CM | POA: Insufficient documentation

## 2020-11-26 DIAGNOSIS — M4802 Spinal stenosis, cervical region: Secondary | ICD-10-CM | POA: Diagnosis not present

## 2020-11-26 MED ORDER — HYDROCODONE-ACETAMINOPHEN 7.5-325 MG PO TABS: 1.0000 | ORAL_TABLET | Freq: Four times a day (QID) | ORAL | 0 refills | Status: DC | PRN

## 2020-11-26 NOTE — Progress Notes (Signed)
Nursing Pain Medication Assessment:  Safety precautions to be maintained throughout the outpatient stay will include: orient to surroundings, keep bed in low position, maintain call bell within reach at all times, provide assistance with transfer out of bed and ambulation.  Medication Inspection Compliance: Pill count conducted under aseptic conditions, in front of the patient. Neither the pills nor the bottle was removed from the patient's sight at any time. Once count was completed pills were immediately returned to the patient in their original bottle.  Medication: Hydrocodone/APAP Pill/Patch Count:  10 of 120 pills remain Pill/Patch Appearance: Markings consistent with prescribed medication Bottle Appearance: Standard pharmacy container. Clearly labeled. Filled Date: 59 / 26 / 2022 Last Medication intake:  Today

## 2020-11-26 NOTE — Telephone Encounter (Signed)
Called patient to let him know that Dr Dossie Arbour would not change the Rx fill date and [facility] will not take a verbal early fill.

## 2020-11-26 NOTE — Patient Instructions (Addendum)
____________________________________________________________________________________________  Medication Rules  Purpose: To inform patients, and their family members, of our rules and regulations.  Applies to: All patients receiving prescriptions (written or electronic).  Pharmacy of record: Pharmacy where electronic prescriptions will be sent. If written prescriptions are taken to a different pharmacy, please inform the nursing staff. The pharmacy listed in the electronic medical record should be the one where you would like electronic prescriptions to be sent.  Electronic prescriptions: In compliance with the [place] Strengthen Opioid Misuse Prevention (STOP) Act of 2017 (Session Law 2017-74/H243), effective January 04, 2018, all controlled substances must be electronically prescribed. Calling prescriptions to the pharmacy will cease to exist.  Prescription refills: Only during scheduled appointments. Applies to all prescriptions.  NOTE: The following applies primarily to controlled substances (Opioid* Pain Medications).   Type of encounter (visit): For patients receiving controlled substances, face-to-face visits are required. (Not an option or up to the patient.)  Patient's responsibilities: Pain Pills: Bring all pain pills to every appointment (except for procedure appointments). Pill Bottles: Bring pills in original pharmacy bottle. Always bring the newest bottle. Bring bottle, even if empty. Medication refills: You are responsible for knowing and keeping track of what medications you take and those you need refilled. The day before your appointment: write a list of all prescriptions that need to be refilled. The day of the appointment: give the list to the admitting nurse. Prescriptions will be written only during appointments. No prescriptions will be written on procedure days. If you forget a medication: it will not be "Called in", "Faxed", or "electronically sent". You will  need to get another appointment to get these prescribed. No early refills. Do not call asking to have your prescription filled early. Prescription Accuracy: You are responsible for carefully inspecting your prescriptions before leaving our office. Have the discharge nurse carefully go over each prescription with you, before taking them home. Make sure that your name is accurately spelled, that your address is correct. Check the name and dose of your medication to make sure it is accurate. Check the number of pills, and the written instructions to make sure they are clear and accurate. Make sure that you are given enough medication to last until your next medication refill appointment. Taking Medication: Take medication as prescribed. When it comes to controlled substances, taking less pills or less frequently than prescribed is permitted and encouraged. Never take more pills than instructed. Never take medication more frequently than prescribed.  Inform other Doctors: Always inform, all of your healthcare providers, of all the medications you take. Pain Medication from other Providers: You are not allowed to accept any additional pain medication from any other Doctor or Healthcare provider. There are two exceptions to this rule. (see below) In the event that you require additional pain medication, you are responsible for notifying us, as stated below. Cough Medicine: Often these contain an opioid, such as codeine or hydrocodone. Never accept or take cough medicine containing these opioids if you are already taking an opioid* medication. The combination may cause respiratory failure and death. Medication Agreement: You are responsible for carefully reading and following our Medication Agreement. This must be signed before receiving any prescriptions from our practice. Safely store a copy of your signed Agreement. Violations to the Agreement will result in no further prescriptions. (Additional copies of our  Medication Agreement are available upon request.) Laws, Rules, & Regulations: All patients are expected to follow all Federal and State Laws, Statutes, Rules, & Regulations. Ignorance of   the Laws does not constitute a valid excuse.  Illegal drugs and Controlled Substances: The use of illegal substances (including, but not limited to marijuana and its derivatives) and/or the illegal use of any controlled substances is strictly prohibited. Violation of this rule may result in the immediate and permanent discontinuation of any and all prescriptions being written by our practice. The use of any illegal substances is prohibited. Adopted CDC guidelines & recommendations: Target dosing levels will be at or below 60 MME/day. Use of benzodiazepines** is not recommended.  Exceptions: There are only two exceptions to the rule of not receiving pain medications from other Healthcare Providers. Exception #1 (Emergencies): In the event of an emergency (i.e.: accident requiring emergency care), you are allowed to receive additional pain medication. However, you are responsible for: As soon as you are able, call our office [phone], at any time of the day or night, and leave a message stating your name, the date and nature of the emergency, and the name and dose of the medication prescribed. In the event that your call is answered by a member of our staff, make sure to document and save the date, time, and the name of the person that took your information.  Exception #2 (Planned Surgery): In the event that you are scheduled by another doctor or dentist to have any type of surgery or procedure, you are allowed (for a period no longer than 30 days), to receive additional pain medication, for the acute post-op pain. However, in this case, you are responsible for picking up a copy of our "Post-op Pain Management for Surgeons" handout, and giving it to your surgeon or dentist. This document is available at our office, and  does not require an appointment to obtain it. Simply go to our office during business hours (Monday-Thursday from 8:00 AM to 4:00 PM) (Friday 8:00 AM to 12:00 Noon) or if you have a scheduled appointment with us, prior to your surgery, and ask for it by name. In addition, you are responsible for: calling our office [phone], at any time of the day or night, and leaving a message stating your name, name of your surgeon, type of surgery, and date of procedure or surgery. Failure to comply with your responsibilities may result in termination of therapy involving the controlled substances. Medication Agreement Violation. Following the above rules, including your responsibilities will help you in avoiding a Medication Agreement Violation ("Breaking your Pain Medication Contract").  *Opioid medications include: morphine, codeine, oxycodone, oxymorphone, hydrocodone, hydromorphone, meperidine, tramadol, tapentadol, buprenorphine, fentanyl, methadone. **Benzodiazepine medications include: diazepam (Valium), alprazolam (Xanax), clonazepam (Klonopine), lorazepam (Ativan), clorazepate (Tranxene), chlordiazepoxide (Librium), estazolam (Prosom), oxazepam (Serax), temazepam (Restoril), triazolam (Halcion) (Last updated: 10/01/2020) ____________________________________________________________________________________________  ____________________________________________________________________________________________  Medication Recommendations and Reminders  Applies to: All patients receiving prescriptions (written and/or electronic).  Medication Rules & Regulations: These rules and regulations exist for your safety and that of others. They are not flexible and neither are we. Dismissing or ignoring them will be considered "non-compliance" with medication therapy, resulting in complete and irreversible termination of such therapy. (See document titled "Medication Rules" for more details.) In all conscience,  because of safety reasons, we cannot continue providing a therapy where the patient does not follow instructions.  Pharmacy of record:  Definition: This is the pharmacy where your electronic prescriptions will be sent.  We do not endorse any particular pharmacy, however, we have experienced problems with [facility] not securing enough medication supply for the community. We do not restrict you   in your choice of pharmacy. However, once we write for your prescriptions, we will NOT be re-sending more prescriptions to fix restricted supply problems created by your pharmacy, or your insurance.  The pharmacy listed in the electronic medical record should be the one where you want electronic prescriptions to be sent. If you choose to change pharmacy, simply notify our nursing staff.  Recommendations: Keep all of your pain medications in a safe place, under lock and key, even if you live alone. We will NOT replace lost, stolen, or damaged medication. After you fill your prescription, take 1 week's worth of pills and put them away in a safe place. You should keep a separate, properly labeled bottle for this purpose. The remainder should be kept in the original bottle. Use this as your primary supply, until it runs out. Once it's gone, then you know that you have 1 week's worth of medicine, and it is time to come in for a prescription refill. If you do this correctly, it is unlikely that you will ever run out of medicine. To make sure that the above recommendation works, it is very important that you make sure your medication refill appointments are scheduled at least 1 week before you run out of medicine. To do this in an effective manner, make sure that you do not leave the office without scheduling your next medication management appointment. Always ask the nursing staff to show you in your prescription , when your medication will be running out. Then arrange for the receptionist to get you a return appointment,  at least 7 days before you run out of medicine. Do not wait until you have 1 or 2 pills left, to come in. This is very poor planning and does not take into consideration that we may need to cancel appointments due to bad weather, sickness, or emergencies affecting our staff. DO NOT ACCEPT A "Partial Fill": If for any reason your pharmacy does not have enough pills/tablets to completely fill or refill your prescription, do not allow for a "partial fill". The law allows the pharmacy to complete that prescription within 72 hours, without requiring a new prescription. If they do not fill the rest of your prescription within those 72 hours, you will need a separate prescription to fill the remaining amount, which we will NOT provide. If the reason for the partial fill is your insurance, you will need to talk to the pharmacist about payment alternatives for the remaining tablets, but again, DO NOT ACCEPT A PARTIAL FILL, unless you can trust your pharmacist to obtain the remainder of the pills within 72 hours.  Prescription refills and/or changes in medication(s):  Prescription refills, and/or changes in dose or medication, will be conducted only during scheduled medication management appointments. (Applies to both, written and electronic prescriptions.) No refills on procedure days. No medication will be changed or started on procedure days. No changes, adjustments, and/or refills will be conducted on a procedure day. Doing so will interfere with the diagnostic portion of the procedure. No phone refills. No medications will be "called into the pharmacy". No Fax refills. No weekend refills. No Holliday refills. No after hours refills.  Remember:  Business hours are:  Monday to Thursday 8:00 AM to 4:00 PM Provider's Schedule: Francisco Naveira, MD - Appointments are:  Medication management: Monday and Wednesday 8:00 AM to 4:00 PM Procedure day: Tuesday and Thursday 7:30 AM to 4:00 PM Bilal Lateef, MD -  Appointments are:  Medication management: Tuesday and Thursday 8:00   AM to 4:00 PM Procedure day: Monday and Wednesday 7:30 AM to 4:00 PM (Last update: 07/25/2019) ____________________________________________________________________________________________  ____________________________________________________________________________________________  CBD (cannabidiol) & Delta-8 (Delta-8 tetrahydrocannabinol) WARNING  Intro: Cannabidiol (CBD) and tetrahydrocannabinol (THC), are two natural compounds found in plants of the Cannabis genus. They can both be extracted from hemp or cannabis. Hemp and cannabis come from the Cannabis sativa plant. Both compounds interact with your body's endocannabinoid system, but they have very different effects. CBD does not produce the high sensation associated with cannabis. Delta-8 tetrahydrocannabinol, also known as delta-8 THC, is a psychoactive substance found in the Cannabis sativa plant, of which marijuana and hemp are two varieties. THC is responsible for the high associated with the illicit use of marijuana.  Applicable to: All individuals currently taking or considering taking CBD (cannabidiol) and, more important, all patients taking opioid analgesic controlled substances (pain medication). (Example: oxycodone; oxymorphone; hydrocodone; hydromorphone; morphine; methadone; tramadol; tapentadol; fentanyl; buprenorphine; butorphanol; dextromethorphan; meperidine; codeine; etc.)  Legal status: CBD remains a Schedule I drug prohibited for any use. CBD is illegal with one exception. In the [place], CBD has a limited Transport planner (FDA) approval for the treatment of two specific types of epilepsy disorders. Only one CBD product has been approved by the FDA for this purpose: "Epidiolex". FDA is aware that some companies are marketing products containing cannabis and cannabis-derived compounds in ways that violate the [employer], Drug and Cosmetic Act  [facility] Act) and that may put the health and safety of consumers at risk. The FDA, a Federal agency, has not enforced the CBD status since 2018.   Legality: Some manufacturers ship CBD products nationally, which is illegal. Often such products are sold online and are therefore available throughout the country. CBD is openly sold in head shops and health food stores in some states where such sales have not been explicitly legalized. Selling unapproved products with unsubstantiated therapeutic claims is not only a violation of the law, but also can put patients at risk, as these products have not been proven to be safe or effective. Federal illegality makes it difficult to conduct research on CBD.  Reference: "FDA Regulation of Cannabis and Cannabis-Derived Products, Including Cannabidiol (CBD)" - [employer]  Warning: CBD is not FDA approved and has not undergo the same manufacturing controls as prescription drugs.  This means that the purity and safety of available CBD may be questionable. Most of the time, despite manufacturer's claims, it is contaminated with THC (delta-9-tetrahydrocannabinol - the chemical in marijuana responsible for the "HIGH").  When this is the case, the [facility] contaminant will trigger a positive urine drug screen (UDS) test for Marijuana (carboxy-THC). Because a positive UDS for any illicit substance is a violation of our medication agreement, your opioid analgesics (pain medicine) may be permanently discontinued. The FDA recently put out a warning about 5 things that everyone should be aware of regarding Delta-8 THC: Delta-8 THC products have not been evaluated or approved by the FDA for safe use and may be marketed in ways that put the public health at risk. The FDA has received adverse event reports involving delta-8 THC-containing products. Delta-8 THC has  psychoactive and intoxicating effects. Delta-8 THC manufacturing often involve use of potentially harmful chemicals to create the concentrations of delta-8 THC claimed in the marketplace. The final delta-8 THC product may have potentially harmful by-products (contaminants) due to the chemicals used in the process. Manufacturing of delta-8 THC products may occur in uncontrolled or unsanitary settings, which may  lead to the presence of unsafe contaminants or other potentially harmful substances. Delta-8 THC products should be kept out of the reach of children and pets.  MORE ABOUT CBD  General Information: CBD was discovered in [birth year] and it is a derivative of the cannabis sativa genus plants (Marijuana and Hemp). It is one of the 113 identified substances found in Marijuana. It accounts for up to 40% of the plant's extract. As of 2018, preliminary clinical studies on CBD included research for the treatment of anxiety, movement disorders, and pain. CBD is available and consumed in multiple forms, including inhalation of smoke or vapor, as an aerosol spray, and by mouth. It may be supplied as an oil containing CBD, capsules, dried cannabis, or as a liquid solution. CBD is thought not to be as psychoactive as THC (delta-9-tetrahydrocannabinol - the chemical in marijuana responsible for the "HIGH"). Studies suggest that CBD may interact with different biological target receptors in the body, including cannabinoid and other neurotransmitter receptors. As of 2018 the mechanism of action for its biological effects has not been determined.  Side-effects  Adverse reactions: Dry mouth, diarrhea, decreased appetite, fatigue, drowsiness, malaise, weakness, sleep disturbances, and others.  Drug interactions: CBC may interact with other medications such as blood-thinners. (Last update: 10/03/2020) ____________________________________________________________________________________________   ____________________________________________________________________________________________  Drug Holidays (Slow)  What is a "Drug Holiday"? Drug Holiday: is the name given to the period of time during which a patient stops taking a medication(s) for the purpose of eliminating tolerance to the drug.  Benefits Improved effectiveness of opioids. Decreased opioid dose needed to achieve benefits. Improved pain with lesser dose.  What is tolerance? Tolerance: is the progressive decreased in effectiveness of a drug due to its repetitive use. With repetitive use, the body gets use to the medication and as a consequence, it loses its effectiveness. This is a common problem seen with opioid pain medications. As a result, a larger dose of the drug is needed to achieve the same effect that used to be obtained with a smaller dose.  How long should a "Drug Holiday" last? You should stay off of the pain medicine for at least 14 consecutive days. (2 weeks)  Should I stop the medicine "cold [place]"? No. You should always coordinate with your Pain Specialist so that he/she can provide you with the correct medication dose to make the transition as smoothly as possible.  How do I stop the medicine? Slowly. You will be instructed to decrease the daily amount of pills that you take by one (1) pill every seven (7) days. This is called a "slow downward taper" of your dose. For example: if you normally take four (4) pills per day, you will be asked to drop this dose to three (3) pills per day for seven (7) days, then to two (2) pills per day for seven (7) days, then to one (1) per day for seven (7) days, and at the end of those last seven (7) days, this is when the "Drug Holiday" would start.   Will I have withdrawals? By doing a "slow downward taper" like this one, it is unlikely that you will experience any significant withdrawal symptoms. Typically, what triggers withdrawals is the sudden stop of a high dose  opioid therapy. Withdrawals can usually be avoided by slowly decreasing the dose over a prolonged period of time. If you do not follow these instructions and decide to stop your medication abruptly, withdrawals may be possible.  What are withdrawals? Withdrawals: refers to  the wide range of symptoms that occur after stopping or dramatically reducing opiate drugs after heavy and prolonged use. Withdrawal symptoms do not occur to patients that use low dose opioids, or those who take the medication sporadically. Contrary to benzodiazepine (example: Valium, Xanax, etc.) or alcohol withdrawals ("Delirium Tremens"), opioid withdrawals are not lethal. Withdrawals are the physical manifestation of the body getting rid of the excess receptors.  Expected Symptoms Early symptoms of withdrawal may include: Agitation Anxiety Muscle aches Increased tearing Insomnia Runny nose Sweating Yawning  Late symptoms of withdrawal may include: Abdominal cramping Diarrhea Dilated pupils Goose bumps Nausea Vomiting  Will I experience withdrawals? Due to the slow nature of the taper, it is very unlikely that you will experience any.  What is a slow taper? Taper: refers to the gradual decrease in dose.  (Last update: 07/25/2019) ____________________________________________________________________________________________   Epidural Steroid Injection Patient Information  Description: The epidural space surrounds the nerves as they exit the spinal cord.  In some patients, the nerves can be compressed and inflamed by a bulging disc or a tight spinal canal (spinal stenosis).  By injecting steroids into the epidural space, we can bring irritated nerves into direct contact with a potentially helpful medication.  These steroids act directly on the irritated nerves and can reduce swelling and inflammation which often leads to decreased pain.  Epidural steroids may be injected anywhere along the spine and from the neck  to the low back depending upon the location of your pain.   After numbing the skin with local anesthetic (like Novocaine), a small needle is passed into the epidural space slowly.  You may experience a sensation of pressure while this is being done.  The entire block usually last less than 10 minutes.  Conditions which may be treated by epidural steroids:  Low back and leg pain Neck and arm pain Spinal stenosis Post-laminectomy syndrome Herpes zoster (shingles) pain Pain from compression fractures  Preparation for the injection:  Do not eat any solid food or dairy products within 8 hours of your appointment.  You may drink clear liquids up to 3 hours before appointment.  Clear liquids include water, black coffee, juice or soda.  No milk or cream please. You may take your regular medication, including pain medications, with a sip of water before your appointment  Diabetics should hold regular insulin (if taken separately) and take 1/2 normal NPH dos the morning of the procedure.  Carry some sugar containing items with you to your appointment. A driver must accompany you and be prepared to drive you home after your procedure.  Bring all your current medications with your. An IV may be inserted and sedation may be given at the discretion of the physician.   A blood pressure cuff, EKG and other monitors will often be applied during the procedure.  Some patients may need to have extra oxygen administered for a short period. You will be asked to provide medical information, including your allergies, prior to the procedure.  We must know immediately if you are taking blood thinners (like Coumadin/Warfarin)  Or if you are allergic to IV iodine contrast (dye). We must know if you could possible be pregnant.  Possible side-effects: Bleeding from needle site Infection (rare, may require surgery) Nerve injury (rare) Numbness & tingling (temporary) Difficulty urinating (rare, temporary) Spinal  headache ( a headache worse with upright posture) Light -headedness (temporary) Pain at injection site (several days) Decreased blood pressure (temporary) Weakness in arm/leg (temporary) Pressure sensation in  back/neck (temporary)  Call if you experience: Fever/chills associated with headache or increased back/neck pain. Headache worsened by an upright position. New onset weakness or numbness of an extremity below the injection site Hives or difficulty breathing (go to the emergency room) Inflammation or drainage at the infection site Severe back/neck pain Any new symptoms which are concerning to you  Please note:  Although the local anesthetic injected can often make your back or neck feel good for several hours after the injection, the pain will likely return.  It takes 3-7 days for steroids to work in the epidural space.  You may not notice any pain relief for at least that one week.  If effective, we will often do a series of three injections spaced 3-6 weeks apart to maximally decrease your pain.  After the initial series, we generally will wait several months before considering a repeat injection of the same type.  If you have any questions, please call [phone] [facility]

## 2020-11-26 NOTE — Progress Notes (Signed)
PROVIDER NOTE: Information contained herein reflects review and annotations entered in association with encounter. Interpretation of such information and data should be left to medically-trained personnel. Information provided to patient can be located elsewhere in the medical record under "Patient Instructions". Document created using STT-dictation technology, any transcriptional errors that may result from process are unintentional.    Patient: Caleb [name]  Service Category: E/M  Provider: Gaspar Cola, MD  DOB: 04/04/1955  DOS: 11/26/2020  Specialty: Interventional Pain Management  MRN: [id number]  Setting: Ambulatory outpatient  PCP: Virginia Crews, MD  Type: Established Patient    Referring Provider: Virginia Crews, MD  Location: Office  Delivery: Face-to-face     HPI  [name], a 65 y.o. year old male, is here today because of his Cervicalgia [M54.2]. [name] primary complain today is Neck Pain and Foot Pain (right) Last encounter: My last encounter with him was on 08/27/2020. Pertinent problems: 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); Cervicalgia; DDD (degenerative disc disease), thoracic; Disorder of superficial peroneal nerve (Right); Chronic upper extremity pain (Left); Cervical spondylosis w/ radiculopathy; Cervical disc disorder with radiculopathy of cervical region; Chronic upper extremity weakness (Left); Abnormal nerve conduction studies (06/08/2017); 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); Spondylosis without myelopathy or radiculopathy, cervical region;  Cervical facet syndrome (Bilateral); Abnormal MRI, cervical spine (10/02/2019 & 08/13/2017); History of fusion of cervical spine; Chronic neck pain (4th area of Pain) (Bilateral) (L>R); Chronic neck pain with history of cervical spinal surgery; LLQ abdominal pain; Neuropathic pain; Nondisplaced fracture of proximal phalanx of left thumb with routine healing; 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, sequelae (Right); Cervical paraspinal muscle spasm; Trigger point of neck; Chronic hip pain (Bilateral); Sacroiliac joint pain (Left); Sacroiliac joint dysfunction (Left); Somatic dysfunction of sacroiliac joint (Left); Chronic low back pain (Bilateral) w/o sciatica; DDD (degenerative disc disease), lumbosacral; Failed back surgical syndrome; Neuropathy of right foot; Lumbar facet syndrome (Bilateral); Chronic sacroiliac joint pain (Bilateral); Lumbosacral facet arthropathy (L4-5, L5-S1); Spondylosis without myelopathy or radiculopathy, lumbosacral region; and Lateral epicondylitis of right elbow on their pertinent problem list. Pain Assessment: Severity of Chronic pain is reported as a 6 /10. Location: Neck  /radiates into shoulder blades. Onset: More than a month ago. Quality: Burning. Timing: Intermittent. Modifying factor(s): nothing. Vitals:  height is _0  (1.727 m) and weight is 199 lb (90.3 kg). His temperature is 97.5 F (36.4 C) (abnormal). His blood pressure is 108/72 and his pulse is 71. His respiration is 18 and oxygen saturation is 96%.   Reason for encounter: medication management.   The patient indicates doing well with the current medication regimen. No adverse reactions or side effects reported to the medications.   Unfortunately, the patient indicates that he is having a flareup of his neck pain and pain between the shoulder blades.  He comes in today requesting a cervical epidural steroid injection.  Evaluation today shows the patient to be in some acute distress with decreased range of motion and evidence of  guarding of the cervical spine.  RTCB: 02/25/2021 Nonopioids transfer 11/21/2019: Lyrica and Robaxin  Pharmacotherapy Assessment  Analgesic: Hydrocodone/APAP 5/325 1 tablet every 6 hours (20 mg/day of hydrocodone).  (Unable to tolerate  an oxycodone IR trial due to stomachaches, nausea, headaches, and excessive somnolence.  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   Monitoring: [place] PMP: PDMP reviewed during this encounter.       Pharmacotherapy: No side-effects or adverse reactions reported. Compliance: No problems identified. Effectiveness: Clinically acceptable.  Dewayne Shorter, RN  11/26/2020  9:02 AM  Sign when Signing Visit Nursing Pain Medication Assessment:  Safety precautions to be maintained throughout the outpatient stay will include: orient to surroundings, keep bed in low position, maintain call bell within reach at all times, provide assistance with transfer out of bed and ambulation.  Medication Inspection Compliance: Pill count conducted under aseptic conditions, in front of the patient. Neither the pills nor the bottle was removed from the patient's sight at any time. Once count was completed pills were immediately returned to the patient in their original bottle.  Medication: Hydrocodone/APAP Pill/Patch Count:  10 of 120 pills remain Pill/Patch Appearance: Markings consistent with prescribed medication Bottle Appearance: Standard pharmacy container. Clearly labeled. Filled Date: 61 / 26 / 2022 Last Medication intake:  Today    UDS:  Summary  Date Value Ref Range Status  08/27/2020 Note  Final    Comment:    ==================================================================== [employer] [facility] (MW) ==================================================================== Test                             Result       Flag       Units  Drug Present and Declared for Prescription Verification   Hydrocodone                    1811          EXPECTED   ng/mg creat   Hydromorphone                  617          EXPECTED   ng/mg creat   Dihydrocodeine                 114          EXPECTED   ng/mg creat   Norhydrocodone                 1799         EXPECTED   ng/mg creat    Sources of hydrocodone include scheduled prescription medications.    Hydromorphone, dihydrocodeine and norhydrocodone are expected    metabolites of hydrocodone. Hydromorphone and dihydrocodeine are    also available as scheduled prescription medications.  Drug Present not Declared for Prescription Verification   Oxazepam                       42           UNEXPECTED ng/mg creat   Temazepam                      41           UNEXPECTED ng/mg creat    Oxazepam and temazepam are expected metabolites of diazepam.    Oxazepam is also an expected metabolite of other benzodiazepine    drugs, including chlordiazepoxide, prazepam, clorazepate, halazepam,    and temazepam.  Oxazepam and temazepam are available as scheduled    prescription medications.  ==================================================================== Test  Result    Flag   Units      Ref Range   Creatinine              76               mg/dL      >=20 ==================================================================== Declared Medications:  The flagging and interpretation on this report are based on the  following declared medications.  Unexpected results may arise from  inaccuracies in the declared medications.   **Note: The testing scope of this panel includes these medications:   Hydrocodone (Norco)   **Note: The testing scope of this panel does not include the  following reported medications:   Acetaminophen (Norco)  Albuterol (Ventolin HFA)  Aspirin  Cetirizine (Zyrtec)  Fluticasone (Flonase)  Hydrochlorothiazide (Hydrodiuril)  Ibuprofen (Advil)  Isosorbide (Imdur)  Losartan (Cozaar)  Melatonin  Methocarbamol (Robaxin)  Mometasone  Montelukast  (Singulair)  Naloxone (Narcan)  Nitroglycerin (Nitrostat)  Pregabalin (Lyrica)  Rosuvastatin (Crestor)  Topical  Umeclidinium (Anoro)  Varenicline (Chantix)  Vilanterol (Anoro) ==================================================================== For clinical consultation, please call [phone]. ====================================================================      ROS  Constitutional: Denies any fever or chills Gastrointestinal: No reported hemesis, hematochezia, vomiting, or acute GI distress Musculoskeletal: Denies any acute onset joint swelling, redness, loss of ROM, or weakness Neurological: No reported episodes of acute onset apraxia, aphasia, dysarthria, agnosia, amnesia, paralysis, loss of coordination, or loss of consciousness  Medication Review  HYDROcodone-acetaminophen, Melatonin, Turmeric, albuterol, aspirin EC, cetirizine, fluocinolone, fluticasone, hydrochlorothiazide, ibuprofen, isosorbide mononitrate, ketorolac, losartan, methocarbamol, mometasone, montelukast, naloxone, nitroGLYCERIN, pregabalin, rosuvastatin, tadalafil, triamcinolone ointment, umeclidinium-vilanterol, and varenicline  History Review  Allergy: [name] is allergic to lisinopril and oxycodone. Drug: [name]  reports no history of drug use. Alcohol:  reports current alcohol use of about 4.0 standard drinks per week. Tobacco:  reports that he has been smoking cigarettes. He has a 25.50 pack-year smoking history. He has quit using smokeless tobacco. Social: [name]  reports that he has been smoking cigarettes. He has a 25.50 pack-year smoking history. He has quit using smokeless tobacco. He reports current alcohol use of about 4.0 standard drinks per week. He reports that he does not use drugs. Medical:  has a past medical history of Chronic foot pain, right (2015), COPD (chronic obstructive pulmonary disease) ([place]), Coronary artery disease, Cough syncope, Emphysema lung ([place]), Family  history of adverse reaction to anesthesia, History of kidney stones, Hyperlipidemia, Hypertension, Kidney stone, Leucocytosis, Myocardial infarction ([place]) (2015), and OSA on CPAP. Surgical: [name]  has a past surgical history that includes Lumbar laminectomy (1989, 1999); Cervical fusion (1988, 1998); Liver surgery (2015); Partial colectomy (1990); Inguinal hernia repair (Bilateral, 1975); Lithotripsy; Colonoscopy with propofol (N/A, 02/24/2017); Cardiac catheterization (2015); Arthrodesis metatarsal (Right, 11/26/2019); and Lesion removal (Right, 11/26/2019). Family: family history includes Alzheimer's disease in his paternal grandmother; Alzheimer's disease (age of onset: 1) in his father; Breast cancer in his maternal uncle; CAD in his mother; Dementia in his father; Diabetes in his maternal grandmother; Healthy in his sister; Heart attack in his maternal uncle; Heart failure in his maternal grandmother; Heart failure (age of onset: 92) in his mother; Non-Hodgkin's lymphoma in his sister.  Laboratory Chemistry Profile   Renal Lab Results  Component Value Date   BUN 20 09/12/2020   CREATININE 0.77 09/12/2020   BCR 26 (H) 09/12/2020   GFRAA 112 09/20/2019   GFRNONAA >60 02/23/2020    Hepatic Lab Results  Component Value Date   AST 17 09/12/2020  ALT 17 09/12/2020   ALBUMIN 4.4 09/12/2020   ALKPHOS 84 09/12/2020    Electrolytes Lab Results  Component Value Date   NA 138 09/12/2020   K 4.4 09/12/2020   CL 97 09/12/2020   CALCIUM 9.9 09/12/2020   MG 2.0 05/30/2018    Bone Lab Results  Component Value Date   25OHVITD1 32 02/17/2017   25OHVITD2 <1.0 02/17/2017   25OHVITD3 32 02/17/2017   TESTOFREE 5.2 (L) 02/28/2017   TESTOSTERONE 279 02/28/2017    Inflammation (CRP: Acute Phase) (ESR: Chronic Phase) Lab Results  Component Value Date   CRP 8 11/07/2017   ESRSEDRATE 26 11/07/2017         Note: Above Lab results reviewed.  Recent Imaging Review  DG Foot Complete  Right Please see detailed radiograph report in office note. Note: Reviewed        Physical Exam  General appearance: Well nourished, well developed, and well hydrated. In no apparent acute distress Mental status: Alert, oriented x 3 (person, place, & time)       Respiratory: No evidence of acute respiratory distress Eyes: PERLA Vitals: BP 108/72 (BP Location: Right Arm, Patient Position: Sitting, Cuff Size: Normal)   Pulse 71   Temp (!) 97.5 F (36.4 C)   Resp 18   Ht _0  (1.727 m)   Wt 199 lb (90.3 kg)   SpO2 96%   BMI 30.26 kg/m  BMI: Estimated body mass index is 30.26 kg/m as calculated from the following:   Height as of this encounter: _1  (1.727 m).   Weight as of this encounter: 199 lb (90.3 kg). Ideal: Ideal body weight: 68.4 kg (150 lb 12.7 oz) Adjusted ideal body weight: 77.1 kg (170 lb 1.2 oz)  Assessment   Status Diagnosis  Worsened Stable Stable 1. Cervicalgia   2. DDD (degenerative disc disease), cervical   3. Cervical disc disorder with radiculopathy of cervical region   4. 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 Cervical paraspinal muscle spasm   6. Cervical facet syndrome (Bilateral)   7. Chronic upper extremity pain (Left)   8. Abnormal MRI, cervical spine (10/02/2019 & 08/13/2017)   9. Chronic foot pain (1ry area of Pain) (Right)   10. Chronic ankle pain (2ry area of Pain) (Right)   11. Chronic thoracic back pain (3ry area of Pain) (Midline)   12. Chronic neck pain (4th area of Pain) (Bilateral) (L>R)   13. DDD (degenerative disc disease), lumbosacral   14. Failed back surgical syndrome   15. Lumbar facet syndrome (Bilateral)   16. Sacroiliac joint pain (Left)   17. Chronic pain syndrome   18. Pharmacologic therapy   19. Chronic use of opiate for therapeutic purpose   20. Encounter for chronic pain management   21. Encounter for medication management      Updated Problems: No problems updated.  Plan of Care   Problem-specific:  No problem-specific Assessment & Plan notes found for this encounter.  [name] has a current medication list which includes the following long-term medication(s): albuterol, cetirizine, fluticasone, hydrochlorothiazide, [START ON 11/27/2020] hydrocodone-acetaminophen, [START ON 12/27/2020] hydrocodone-acetaminophen, [START ON 01/26/2021] hydrocodone-acetaminophen, isosorbide mononitrate, losartan, methocarbamol, montelukast, nitroglycerin, pregabalin, rosuvastatin, and tadalafil.  Pharmacotherapy (Medications Ordered): Meds ordered this encounter  Medications   HYDROcodone-acetaminophen (NORCO) 7.5-325 MG tablet    Sig: Take 1 tablet by mouth every 6 (six) hours as needed for severe pain. Must last 30 days.    Dispense:  120  tablet    Refill:  0    DO NOT: delete (not duplicate); no partial-fill (will deny script to complete), no refill request (F/U required). DISPENSE: 1 day early if closed on fill date. WARN: No CNS-depressants within 8 hrs of med.   HYDROcodone-acetaminophen (NORCO) 7.5-325 MG tablet    Sig: Take 1 tablet by mouth every 6 (six) hours as needed for severe pain. Must last 30 days.    Dispense:  120 tablet    Refill:  0    DO NOT: delete (not duplicate); no partial-fill (will deny script to complete), no refill request (F/U required). DISPENSE: 1 day early if closed on fill date. WARN: No CNS-depressants within 8 hrs of med.   HYDROcodone-acetaminophen (NORCO) 7.5-325 MG tablet    Sig: Take 1 tablet by mouth every 6 (six) hours as needed for severe pain. Must last 30 days.    Dispense:  120 tablet    Refill:  0    DO NOT: delete (not duplicate); no partial-fill (will deny script to complete), no refill request (F/U required). DISPENSE: 1 day early if closed on fill date. WARN: No CNS-depressants within 8 hrs of med.    Orders:  Orders Placed This Encounter  Procedures   Cervical Epidural Injection    Sedation: Patient's choice. Purpose:  Diagnostic/Therapeutic Indication(s): Radiculitis and cervicalgia associater with cervical degenerative disc disease.    Standing Status:   Future    Standing Expiration Date:   02/26/2021    Scheduling Instructions:     Procedure: Cervical Epidural Steroid Injection/Block     Level(s): C7-T1     Laterality: Left-sided     Timeframe: As soon as schedule allows    Order Specific Question:   Where will this procedure be performed?    Answer:   [facility]    Comments:   by Dr. Dossie Arbour    Follow-up plan:   Return for [facility]) procedure: (L) CESI , (Sed-anx).     Interventional Therapies  Risk  Complexity Considerations:   Estimated body mass index is 30.04 kg/m as calculated from the following:   Height as of 06/20/20: $RemoveBef'5\' 8"'CilKPiXyiP$  (1.727 m).   Weight as of 06/20/20: 197 lb 9.6 oz (89.6 kg). WNL   Planned  Pending:   Diagnostic bilateral lumbar facet MBB #2    Under consideration:   Possible spinal cord stimulator trial  Possible bilateral cervical facet RFA    Completed:   Therapeutic/palliative left CESI x8 (06/19/2020) (6-0) (100/100/80/80)  Diagnostic bilateral cervical facet MBB x2 (09/22/2017) (5-1) (100/100/100/>50)  Diagnostic bilateral lumbar facet MBB x1 (01/29/2020) (3-0) (100/100/90/90)  Diagnostic left SI joint block x1 (06/19/2019) (8-0) (100/100/100 x3 days/0)  Therapeutic midline serratus posterior TPI/MNB x1 (01/11/2019)  Diagnostic right Common Peroneal NB (C-PNB) x2 (04/14/2017) (5-0) (100/100/20/<25)  Therapeutic right common peroneal nerve (C-PN) RFA x1 (06/21/2017) (3-0) (100/100/100 x 3 days/75-100)  Diagnostic right superficial peroneal (S-PN) NB x4 (09/13/2019) (7-3) (100/100/100/90-100) Therapeutic right superficial peroneal nerve (S-PN) RFA x1 (10/25/2017) (4-0) (100/100/100 x1 day/25) Therapeutic right deep peroneal NB (D-PNB) x1 (05/03/2019)    Therapeutic  Palliative (PRN) options:   Palliative left CESI  Diagnostic bilateral cervical facet block #3   Palliative right superficial peroneal NB #5  Palliative right superficial peroneal nerve RFA #2     Recent Visits No visits were found meeting these conditions. Showing recent visits within past 90 days and meeting all other requirements Today's Visits Date Type Provider Dept  11/26/20 Office Visit Milinda Pointer,  MD [facility]  Showing today's visits and meeting all other requirements Future Appointments Date Type Provider Dept  02/23/21 Appointment Milinda Pointer, MD [facility]  Showing future appointments within next 90 days and meeting all other requirements I discussed the assessment and treatment plan with the patient. The patient was provided an opportunity to ask questions and all were answered. The patient agreed with the plan and demonstrated an understanding of the instructions.  Patient advised to call back or seek an in-person evaluation if the symptoms or condition worsens.  Duration of encounter: 30 minutes.  Note by: Gaspar Cola, MD Date: 11/26/2020; Time: 9:56 AM

## 2020-11-26 NOTE — Telephone Encounter (Signed)
Pharmacy states if you will call them they can fill his scripts today. He is going out of town and he will be out for the weekend.

## 2020-11-26 NOTE — Progress Notes (Signed)
Patient in today for MM.  Patient forgot to tell FN that he would be traveling tomorrow and would need to fill Rx 1 day early.  Called to [facility] and asked if they would go ahead and fill today that FN has given an approval for this.  [facility] states that FN would have to send in another Rx to reflect fill date of 11/26/20.

## 2020-11-26 NOTE — Telephone Encounter (Signed)
Returned the call to pharmacy, they state that because there is fill date on the Rx that they can't fill any earlier than what is specified.

## 2020-11-28 ENCOUNTER — Other Ambulatory Visit: Payer: Self-pay | Admitting: Cardiovascular Disease

## 2020-12-03 ENCOUNTER — Other Ambulatory Visit: Payer: Self-pay | Admitting: Pulmonary Disease

## 2020-12-03 ENCOUNTER — Other Ambulatory Visit: Payer: Self-pay | Admitting: Family Medicine

## 2020-12-03 DIAGNOSIS — L400 Psoriasis vulgaris: Secondary | ICD-10-CM

## 2020-12-18 ENCOUNTER — Other Ambulatory Visit: Payer: Self-pay

## 2020-12-18 ENCOUNTER — Encounter: Payer: Self-pay | Admitting: Pain Medicine

## 2020-12-18 ENCOUNTER — Ambulatory Visit (HOSPITAL_BASED_OUTPATIENT_CLINIC_OR_DEPARTMENT_OTHER): Payer: Medicaid Other | Admitting: Pain Medicine

## 2020-12-18 ENCOUNTER — Ambulatory Visit
Admission: RE | Admit: 2020-12-18 | Discharge: 2020-12-18 | Disposition: A | Discharge status: Home / Self Care | Payer: Medicaid Other | Source: Ambulatory Visit | Attending: Pain Medicine | Admitting: Pain Medicine

## 2020-12-18 VITALS — BP 124/56 | HR 69 | Temp 98.3°F | Resp 16 | Ht 68.0 in | Wt 210.0 lb

## 2020-12-18 DIAGNOSIS — Z9889 Other specified postprocedural states: Secondary | ICD-10-CM | POA: Insufficient documentation

## 2020-12-18 DIAGNOSIS — G8929 Other chronic pain: Secondary | ICD-10-CM

## 2020-12-18 DIAGNOSIS — M47812 Spondylosis without myelopathy or radiculopathy, cervical region: Secondary | ICD-10-CM

## 2020-12-18 DIAGNOSIS — M5010 Cervical disc disorder with radiculopathy, unspecified cervical region: Secondary | ICD-10-CM | POA: Insufficient documentation

## 2020-12-18 DIAGNOSIS — M4802 Spinal stenosis, cervical region: Secondary | ICD-10-CM | POA: Insufficient documentation

## 2020-12-18 DIAGNOSIS — M5412 Radiculopathy, cervical region: Secondary | ICD-10-CM | POA: Diagnosis present

## 2020-12-18 DIAGNOSIS — M5030 Other cervical disc degeneration, unspecified cervical region: Secondary | ICD-10-CM | POA: Diagnosis present

## 2020-12-18 DIAGNOSIS — M79602 Pain in left arm: Secondary | ICD-10-CM | POA: Insufficient documentation

## 2020-12-18 DIAGNOSIS — R937 Abnormal findings on diagnostic imaging of other parts of musculoskeletal system: Secondary | ICD-10-CM | POA: Diagnosis present

## 2020-12-18 DIAGNOSIS — M4722 Other spondylosis with radiculopathy, cervical region: Secondary | ICD-10-CM | POA: Insufficient documentation

## 2020-12-18 DIAGNOSIS — M62838 Other muscle spasm: Secondary | ICD-10-CM | POA: Diagnosis present

## 2020-12-18 DIAGNOSIS — M542 Cervicalgia: Secondary | ICD-10-CM

## 2020-12-18 DIAGNOSIS — R94130 Abnormal response to nerve stimulation, unspecified: Secondary | ICD-10-CM

## 2020-12-18 DIAGNOSIS — G8928 Other chronic postprocedural pain: Secondary | ICD-10-CM | POA: Insufficient documentation

## 2020-12-18 MED ORDER — IOHEXOL 180 MG/ML  SOLN
10.0000 mL | Freq: Once | INTRAMUSCULAR | Status: AC
  Administered 2020-12-18: 10 mL via EPIDURAL

## 2020-12-18 MED ORDER — SODIUM CHLORIDE (PF) 0.9 % IJ SOLN
INTRAMUSCULAR | Status: AC
  Filled 2020-12-18: qty 10

## 2020-12-18 MED ORDER — ROPIVACAINE HCL 2 MG/ML IJ SOLN
INTRAMUSCULAR | Status: AC
  Filled 2020-12-18: qty 20

## 2020-12-18 MED ORDER — ROPIVACAINE HCL 2 MG/ML IJ SOLN
1.0000 mL | Freq: Once | INTRAMUSCULAR | Status: AC
  Administered 2020-12-18: 1 mL via EPIDURAL

## 2020-12-18 MED ORDER — MIDAZOLAM HCL 5 MG/5ML IJ SOLN
0.5000 mg | Freq: Once | INTRAMUSCULAR | Status: AC
  Administered 2020-12-18: 2 mg via INTRAVENOUS

## 2020-12-18 MED ORDER — LACTATED RINGERS IV SOLN
1000.0000 mL | Freq: Once | INTRAVENOUS | Status: AC
  Administered 2020-12-18: 1000 mL via INTRAVENOUS

## 2020-12-18 MED ORDER — SODIUM CHLORIDE 0.9% FLUSH
1.0000 mL | Freq: Once | INTRAVENOUS | Status: AC
  Administered 2020-12-18: 1 mL

## 2020-12-18 MED ORDER — PENTAFLUOROPROP-TETRAFLUOROETH EX AERO
INHALATION_SPRAY | Freq: Once | CUTANEOUS | Status: AC
  Administered 2020-12-18: 30 via TOPICAL
  Filled 2020-12-18: qty 116

## 2020-12-18 MED ORDER — DEXAMETHASONE SODIUM PHOSPHATE 10 MG/ML IJ SOLN
INTRAMUSCULAR | Status: AC
  Filled 2020-12-18: qty 1

## 2020-12-18 MED ORDER — LIDOCAINE HCL 2 % IJ SOLN
20.0000 mL | Freq: Once | INTRAMUSCULAR | Status: AC
  Administered 2020-12-18: 200 mg

## 2020-12-18 MED ORDER — DEXAMETHASONE SODIUM PHOSPHATE 10 MG/ML IJ SOLN
10.0000 mg | Freq: Once | INTRAMUSCULAR | Status: AC
  Administered 2020-12-18: 10 mg

## 2020-12-18 MED ORDER — IOHEXOL 180 MG/ML  SOLN
INTRAMUSCULAR | Status: AC
  Filled 2020-12-18: qty 10

## 2020-12-18 MED ORDER — LIDOCAINE HCL 2 % IJ SOLN
INTRAMUSCULAR | Status: AC
  Filled 2020-12-18: qty 10

## 2020-12-18 MED ORDER — MIDAZOLAM HCL 5 MG/5ML IJ SOLN
INTRAMUSCULAR | Status: AC
  Filled 2020-12-18: qty 5

## 2020-12-18 NOTE — Progress Notes (Signed)
Safety precautions to be maintained throughout the outpatient stay will include: orient to surroundings, keep bed in low position, maintain call bell within reach at all times, provide assistance with transfer out of bed and ambulation.  

## 2020-12-18 NOTE — Progress Notes (Signed)
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   Medications administered: We administered iohexol, lactated ringers, sodium chloride flush, and dexamethasone.  Follow-up plan:   Return in about 2 weeks (around 01/01/2021) for Proc-day (T,Th), (VV), (PPE).      Interventional Therapies  Risk   Complexity Considerations:   Estimated body mass index is 31.93 kg/m as calculated from the following:   Height as of this encounter: 5\' 8"  (1.727 m).   Weight as of this encounter: 210 lb (95.3 kg). WNL   Planned   Pending:   Therapeutic left cervical ESI (12/18/2020)   Under consideration:   Possible spinal cord stimulator trial  Possible bilateral cervical facet RFA    Completed:   Therapeutic/palliative left CESI x8 (06/19/2020) (6-0) (100/100/80/80)  Diagnostic bilateral cervical facet MBB x2 (09/22/2017) (5-1) (100/100/100/>50)  Diagnostic bilateral lumbar facet MBB x1 (01/29/2020) (3-0) (100/100/90/90)  Diagnostic left SI joint block x1 (06/19/2019) (8-0)  (100/100/100 x3 days/0)  Therapeutic midline serratus posterior TPI/MNB x1 (01/11/2019)  Diagnostic right Common Peroneal NB (C-PNB) x2 (04/14/2017) (5-0) (100/100/20/<25)  Therapeutic right common peroneal nerve (C-PN) RFA x1 (06/21/2017) (3-0) (100/100/100 x 3 days/75-100)  Diagnostic right superficial peroneal (S-PN) NB x5 (08/19/2020) (7-3) (100/100/100/90-100) Therapeutic right superficial peroneal nerve (S-PN) RFA x1 (10/25/2017) (4-0) (100/100/100 x1 day/25) Therapeutic right deep peroneal NB (D-PNB) x1 (05/03/2019)    Therapeutic   Palliative (PRN) options:   Palliative left CESI  Diagnostic bilateral cervical facet block #3  Palliative right superficial peroneal NB #5  Palliative right superficial peroneal nerve RFA #2     Recent Visits Date Type Provider Dept  11/26/20 Office Visit Milinda Pointer, MD [facility]  Showing recent visits within past 90 days and meeting all other requirements Today's Visits Date Type Provider Dept  12/18/20 Procedure visit Milinda Pointer, MD [facility]  Showing today's visits and meeting all other requirements Future Appointments Date Type Provider Dept  02/23/21 Appointment Milinda Pointer, MD [facility]  Showing future appointments within next 90 days and meeting all other requirements  Disposition: Discharge home  Discharge (Date   Time): 12/18/2020;   hrs.   Primary Care Physician: Virginia Crews, MD Location: [facility] Note by: Gaspar Cola, MD Date: 12/18/2020; Time: 11:31 AM  DISCLAIMER: Medicine is not an Chief Strategy Officer. It has no guarantees or warranties. The decision to proceed with this intervention was based on the information collected from the patient. Conclusions were drawn from the patient's questionnaire, interview, and examination. Because information was provided in large part by the patient, it cannot be guaranteed that it has not been  purposely or unconsciously manipulated or altered. Every effort has been made to obtain as much accurate, relevant, available data as possible. Always take into account that the treatment will also be dependent on availability of resources and existing treatment guidelines, considered by other Pain Management Specialists as being common knowledge and practice, at the time of the intervention. It is also important to point out that variation in procedural techniques and pharmacological choices are the acceptable norm. For Medico-Legal review purposes, the indications, contraindications, technique, and results of the these procedures should only be evaluated, judged and interpreted by a Board-Certified Interventional Pain Specialist with extensive familiarity and expertise in the same exact procedure and technique.

## 2020-12-18 NOTE — Patient Instructions (Signed)

## 2020-12-19 ENCOUNTER — Other Ambulatory Visit: Payer: Self-pay | Admitting: Podiatry

## 2020-12-19 ENCOUNTER — Telehealth: Payer: Self-pay

## 2020-12-19 NOTE — Telephone Encounter (Signed)
Post procedure phone call.  Patient states he is doing good.  

## 2020-12-21 ENCOUNTER — Other Ambulatory Visit: Payer: Self-pay | Admitting: Family Medicine

## 2020-12-21 DIAGNOSIS — M792 Neuralgia and neuritis, unspecified: Secondary | ICD-10-CM

## 2020-12-21 DIAGNOSIS — G894 Chronic pain syndrome: Secondary | ICD-10-CM

## 2020-12-21 DIAGNOSIS — S8411XS Injury of peroneal nerve at lower leg level, right leg, sequela: Secondary | ICD-10-CM

## 2020-12-30 ENCOUNTER — Ambulatory Visit (INDEPENDENT_AMBULATORY_CARE_PROVIDER_SITE_OTHER): Payer: Medicaid Other | Admitting: Podiatry

## 2020-12-30 ENCOUNTER — Ambulatory Visit (INDEPENDENT_AMBULATORY_CARE_PROVIDER_SITE_OTHER): Payer: Medicaid Other

## 2020-12-30 ENCOUNTER — Other Ambulatory Visit: Payer: Self-pay

## 2020-12-30 DIAGNOSIS — M7671 Peroneal tendinitis, right leg: Secondary | ICD-10-CM

## 2020-12-30 DIAGNOSIS — Z9889 Other specified postprocedural states: Secondary | ICD-10-CM

## 2020-12-30 MED ORDER — LIDOCAINE 5 % EX OINT: 1.0000 "application " | TOPICAL_OINTMENT | CUTANEOUS | 0 refills | Status: DC | PRN
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
## 2021-01-05 NOTE — Progress Notes (Signed)
Patient: Caleb [name]  Service Category: E/M  Provider: Gaspar Cola, MD  DOB: October 15, 1955  DOS: 01/06/2021  Location: Office  MRN: [id number]  Setting: Ambulatory outpatient  Referring Provider: Virginia Crews, MD  Type: Established Patient  Specialty: Interventional Pain Management  PCP: Virginia Crews, MD  Location: Remote location  Delivery: TeleHealth     Virtual Encounter - Pain Management PROVIDER NOTE: Information contained herein reflects review and annotations entered in association with encounter. Interpretation of such information and data should be left to medically-trained personnel. Information provided to patient can be located elsewhere in the medical record under "Patient Instructions". Document created using STT-dictation technology, any transcriptional errors that may result from process are unintentional.    Contact & Pharmacy Preferred: [phone] Home: [phone] (home) Mobile: [phone] (mobile) E-mail: [email or web address]  [id number] [address]Phone: [phone]: [phone] [id number]: [phone]: [phone]  Pre-screening  Mr. HTallooffered "in-person" vs "virtual" encounter. He indicated preferring virtual for this encounter.   Reason COVID-19*   Social distancing based on CDC and AMA recommendations.   I contacted Caleb Capronon 01/06/2021 via telephone.      I clearly identified myself as FGaspar Cola MD. I verified that I was speaking with the correct person using two identifiers (Name: RBENNEY SOMM[place]LE and date of birth: [birth year].  Consent I sought verbal advanced consent from Caleb Capronfor virtual visit interactions. I informed Mr. HNocitoof possible security and privacy concerns, risks, and limitations associated  with providing "not-in-person" medical evaluation and management services. I also informed Mr. HUselmanof the availability of "in-person" appointments. Finally, I informed him that there would be a charge for the virtual visit and that he could be  personally, fully or partially, financially responsible for it. Caleb [name] understanding and agreed to proceed.   Historic Elements   Mr. RKAMIR SELOVERis a 66y.o. year old, male patient evaluated today after our last contact on 12/18/2020. Caleb [name] has a past medical history of Chronic foot pain, right (2015), COPD (chronic obstructive pulmonary disease) ([place], Coronary artery disease, Cough syncope, Emphysema lung ([place], Family history of adverse reaction to anesthesia, History of kidney stones, Hyperlipidemia, Hypertension, Kidney stone, Leucocytosis, [place]ocardial infarction ([place] ([place]), and OSA on CPAP. He also  has a past surgical history that includes Lumbar laminectomy (1989, 1999); Cervical fusion (1988, 1998); Liver surgery (2015); Partial colectomy (1990); Inguinal hernia repair (Bilateral, 1975); Lithotripsy; Colonoscopy with propofol (N/A, 02/24/2017); Cardiac catheterization (2015); Arthrodesis metatarsal (Right, 11/26/2019); and Lesion removal (Right, 11/26/2019). Mr. HCasanashas a current medication list which includes the following prescription(s): albuterol, anoro ellipta, aspirin ec, cetirizine, fluocinolone, fluticasone, hydrochlorothiazide, hydrocodone-acetaminophen, [START ON 01/26/2021] hydrocodone-acetaminophen, ibuprofen, isosorbide mononitrate, ketorolac, lidocaine, losartan, melatonin, methocarbamol, mometasone, montelukast, naloxone, nitroglycerin, pregabalin, rosuvastatin, tadalafil, triamcinolone ointment, turmeric, varenicline, and hydrocodone-acetaminophen. He  reports that he has been smoking cigarettes. He has a 25.50 pack-year smoking history. He has quit using smokeless tobacco. He reports current alcohol use of about 4.0  standard drinks per week. He reports that he does not use drugs. Caleb [name] allergic to lisinopril and oxycodone.   HPI  Today, he is being contacted for a post-procedure assessment.  According to the patient, the cervical epidural steroid injection provided him with complete relief of  the pain for the duration of the local anesthetic, but it only lasted for approximately 4 days.  In a similar manner, on 05/15/2020 we had done a cervical epidural steroid injection for him that also provided him with 100% relief of the pain for the duration of the local anesthetic but no long-term benefit.  It was not until we did the second injection in that series, on 06/19/2020 that he attained 100% relief of the pain that lasted close to 6 months.  For this reason, we plan to do the second 1 in the series, to hopefully get that same long-term benefit.  He has been seen by neurosurgeons and it would appear that he is not a candidate for any further cervical surgery at this time.  The plan was shared with the patient who has agreed.  Post-procedure evaluation    : Interlaminar Cervical Epidural Steroid injection (ESI) Laterality: Left Level: C7-T1 Analgesia: Local anesthesia Sedation: minimal anxiolysis.  (Versed 2 mg) Imaging: Fluoroscopy-assisted  Purpose: Therapeutic/Palliative Indications: Cervicalgia, cervical radicular pain, degenerative disc disease, severe enough to impact quality of life or function. 1. Cervicalgia   2. DDD (degenerative disc disease), cervical   3. Cervical disc disorder with radiculopathy of cervical region   4. 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 Cervical spondylosis w/ radiculopathy   6. 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 Chronic neck pain with history of cervical spinal surgery   8. Abnormal nerve conduction studies (06/08/2017)   9. Abnormal MRI, cervical spine (10/02/2019 & 08/13/2017)    NAS-11 score:   Pre-procedure:  6 /10   Post-procedure: 6 /10     Effectiveness:  Initial hour after procedure: 100 %. Subsequent 4-6 hours post-procedure: 100 %. Analgesia past initial 6 hours: 75 % (lasting 4 days). Ongoing improvement:  Analgesic: The patient refers that the pain has returned. Function: Back to baseline ROM: Back to baseline  Pharmacotherapy Assessment   Opioid Analgesic: Hydrocodone/APAP 5/325 1 tablet every 6 hours (20 mg/day of hydrocodone).  (Unable to tolerate an oxycodone IR trial due to stomachaches, nausea, headaches, and excessive somnolence.  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   Monitoring: [place] PMP: PDMP reviewed during this encounter.       Pharmacotherapy: No side-effects or adverse reactions reported. Compliance: No problems identified. Effectiveness: Clinically acceptable. Plan: Refer to "POC". UDS:  Summary  Date Value Ref Range Status  08/27/2020 Note  Final    Comment:    ==================================================================== [employer] Select 13 (MW) ==================================================================== Test                             Result       Flag       Units  Drug Present and Declared for Prescription Verification   Hydrocodone                    1811         EXPECTED   ng/mg creat   Hydromorphone                  617          EXPECTED   ng/mg creat   Dihydrocodeine                 114          EXPECTED   ng/mg creat   Norhydrocodone  1799         EXPECTED   ng/mg creat    Sources of hydrocodone include scheduled prescription medications.    Hydromorphone, dihydrocodeine and norhydrocodone are expected    metabolites of hydrocodone. Hydromorphone and dihydrocodeine are    also available as scheduled prescription medications.  Drug Present not Declared for Prescription Verification   Oxazepam                       42           UNEXPECTED ng/mg creat   Temazepam                       41           UNEXPECTED ng/mg creat    Oxazepam and temazepam are expected metabolites of diazepam.    Oxazepam is also an expected metabolite of other benzodiazepine    drugs, including chlordiazepoxide, prazepam, clorazepate, halazepam,    and temazepam.  Oxazepam and temazepam are available as scheduled    prescription medications.  ==================================================================== Test                      Result    Flag   Units      Ref Range   Creatinine              76               mg/dL      >=20 ==================================================================== Declared Medications:  The flagging and interpretation on this report are based on the  following declared medications.  Unexpected results may arise from  inaccuracies in the declared medications.   **Note: The testing scope of this panel includes these medications:   Hydrocodone (Norco)   **Note: The testing scope of this panel does not include the  following reported medications:   Acetaminophen (Norco)  Albuterol (Ventolin HFA)  Aspirin  Cetirizine (Zyrtec)  Fluticasone (Flonase)  Hydrochlorothiazide (Hydrodiuril)  Ibuprofen (Advil)  Isosorbide (Imdur)  Losartan (Cozaar)  Melatonin  Methocarbamol (Robaxin)  Mometasone  Montelukast (Singulair)  Naloxone (Narcan)  Nitroglycerin (Nitrostat)  Pregabalin (Lyrica)  Rosuvastatin (Crestor)  Topical  Umeclidinium (Anoro)  Varenicline (Chantix)  Vilanterol (Anoro) ==================================================================== For clinical consultation, please call [phone]. ====================================================================      Laboratory Chemistry Profile   Renal Lab Results  Component Value Date   BUN 20 09/12/2020   CREATININE 0.77 09/12/2020   BCR 26 (H) 09/12/2020   GFRAA 112 09/20/2019   GFRNONAA >60 02/23/2020    Hepatic Lab Results  Component Value Date   AST 17 09/12/2020   ALT 17  09/12/2020   ALBUMIN 4.4 09/12/2020   ALKPHOS 84 09/12/2020    Electrolytes Lab Results  Component Value Date   NA 138 09/12/2020   K 4.4 09/12/2020   CL 97 09/12/2020   CALCIUM 9.9 09/12/2020   MG 2.0 05/30/2018    Bone Lab Results  Component Value Date   25OHVITD1 32 02/17/2017   25OHVITD2 <1.0 02/17/2017   25OHVITD3 32 02/17/2017   TESTOFREE 5.2 (L) 02/28/2017   TESTOSTERONE 279 02/28/2017    Inflammation (CRP: Acute Phase) (ESR: Chronic Phase) Lab Results  Component Value Date   CRP 8 11/07/2017   ESRSEDRATE 26 11/07/2017         Note: Above Lab results reviewed.  Imaging  DG Foot Complete Right Please see detailed radiograph report in office note.  Assessment  The primary encounter diagnosis was Cervicalgia. 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  Plan of Care  Problem-specific:  No problem-specific Assessment & Plan notes found for this encounter.  Caleb [name] has a current medication list which includes the following long-term medication(s): albuterol, cetirizine, fluticasone, hydrochlorothiazide, hydrocodone-acetaminophen, [START ON 01/26/2021] hydrocodone-acetaminophen, isosorbide mononitrate, losartan, methocarbamol, montelukast, nitroglycerin, pregabalin, rosuvastatin, tadalafil, and hydrocodone-acetaminophen.  Pharmacotherapy (Medications Ordered): No orders of the defined types were placed in this encounter.  Orders:  Orders Placed This Encounter  Procedures   Cervical Epidural Injection    Sedation: Patient's choice. Purpose: Diagnostic/Therapeutic Indication(s): Radiculitis and cervicalgia associater with cervical degenerative disc disease.    Standing Status:   Future    Standing Expiration Date:   04/06/2021    Scheduling  Instructions:     Procedure: Cervical Epidural Steroid Injection/Block     Level(s): C7-T1     Laterality: TBD     Timeframe: As soon as schedule allows    Order Specific Question:   Where will this procedure be performed?    Answer:   [facility]    Comments:   by Dr. Dossie Arbour   Follow-up plan:   Return for [facility]) procedure: (L) CESI #2, (Sed-anx).     Interventional Therapies  Risk   Complexity Considerations:   Estimated body mass index is 31.93 kg/m as calculated from the following:   Height as of this encounter: 5' 8" (1.727 m).   Weight as of this encounter: 210 lb (95.3 kg). WNL   Planned   Pending:   Therapeutic left cervical ESI (12/18/2020)   Under consideration:   Possible spinal cord stimulator trial  Possible bilateral cervical facet RFA    Completed:   Therapeutic/palliative left CESI x8 (06/19/2020) (6-0) (100/100/80/80)  Diagnostic bilateral cervical facet MBB x2 (09/22/2017) (5-1) (100/100/100/>50)  Diagnostic bilateral lumbar facet MBB x1 (01/29/2020) (3-0) (100/100/90/90)  Diagnostic left SI joint block x1 (06/19/2019) (8-0) (100/100/100 x3 days/0)  Therapeutic midline serratus posterior TPI/MNB x1 (01/11/2019)  Diagnostic right Common Peroneal NB (C-PNB) x2 (04/14/2017) (5-0) (100/100/20/<25)  Therapeutic right common peroneal nerve (C-PN) RFA x1 (06/21/2017) (3-0) (100/100/100 x 3 days/75-100)  Diagnostic right superficial peroneal (S-PN) NB x5 (08/19/2020) (7-3) (100/100/100/90-100) Therapeutic right superficial peroneal nerve (S-PN) RFA x1 (10/25/2017) (4-0) (100/100/100 x1 day/25) Therapeutic right deep peroneal NB (D-PNB) x1 (05/03/2019)    Therapeutic   Palliative (PRN) options:   Palliative left CESI  Diagnostic bilateral cervical facet block #3  Palliative right superficial peroneal NB #5  Palliative right superficial peroneal nerve RFA #2      Recent Visits Date Type Provider Dept  12/18/20 Procedure visit Milinda Pointer, MD [facility]  11/26/20 Office Visit Milinda Pointer, MD [facility]  Showing recent visits within past 90 days and meeting all other requirements Today's Visits Date Type Provider Dept  01/06/21 Office Visit Milinda Pointer, MD [facility]  Showing today's visits and meeting all other requirements Future Appointments Date Type Provider Dept  02/23/21 Appointment Milinda Pointer, MD [facility]  Showing future appointments within next 90 days and meeting all other requirements  I discussed the assessment and treatment plan with the patient. The patient was provided an opportunity to ask questions and all were answered. The patient agreed with the plan and demonstrated an understanding of the  instructions.  Patient advised to call back or seek an in-person evaluation if the symptoms or condition worsens.  Duration of encounter: 18 minutes.  Note by: Gaspar Cola, MD Date: 01/06/2021; Time: 10:20 AM

## 2021-01-06 ENCOUNTER — Ambulatory Visit: Payer: Medicaid Other | Admitting: Dermatology

## 2021-01-06 ENCOUNTER — Other Ambulatory Visit: Payer: Self-pay

## 2021-01-06 ENCOUNTER — Ambulatory Visit: Payer: Medicaid Other | Attending: Pain Medicine | Admitting: Pain Medicine

## 2021-01-06 DIAGNOSIS — M4802 Spinal stenosis, cervical region: Secondary | ICD-10-CM | POA: Diagnosis not present

## 2021-01-06 DIAGNOSIS — M542 Cervicalgia: Secondary | ICD-10-CM | POA: Diagnosis not present

## 2021-01-06 DIAGNOSIS — L853 Xerosis cutis: Secondary | ICD-10-CM

## 2021-01-06 DIAGNOSIS — M47812 Spondylosis without myelopathy or radiculopathy, cervical region: Secondary | ICD-10-CM | POA: Diagnosis not present

## 2021-01-06 DIAGNOSIS — M5030 Other cervical disc degeneration, unspecified cervical region: Secondary | ICD-10-CM | POA: Diagnosis not present

## 2021-01-06 DIAGNOSIS — L409 Psoriasis, unspecified: Secondary | ICD-10-CM | POA: Diagnosis not present

## 2021-01-06 DIAGNOSIS — M5010 Cervical disc disorder with radiculopathy, unspecified cervical region: Secondary | ICD-10-CM | POA: Diagnosis not present

## 2021-01-06 MED ORDER — CLOBETASOL PROP EMOLLIENT BASE 0.05 % EX CREA: 1.0000 "application " | TOPICAL_CREAM | Freq: Two times a day (BID) | CUTANEOUS | 1 refills | Status: DC

## 2021-01-06 MED ORDER — CLOBETASOL PROPIONATE 0.05 % EX SOLN: 1.0000 "application " | CUTANEOUS | 1 refills | Status: DC

## 2021-01-06 NOTE — Progress Notes (Signed)
New Patient Visit  Subjective  Caleb [name] is a 66 y.o. male who presents for the following: New Patient (Initial Visit) (Patient states he is here today concerning psoriasis flare at abdomen, lower legs, waist line, inside ears, and face.  Patient reports he used to be on cocentyx but insurance did not cover. Patient is using triamcinolone to areas. /).  He is very itchy and psoriasis is spreading.  He was doing fine until getting Covid two months ago, and then the psoriasis got worse.  Patient has used Humira before Cosentyx and did not work. He has also used multiple topical steroid creams in the past.  It has been a while since he has been seen, more than 3 years.  He tends to get better over the summer and then worsen in the winter.  Patient reports all day pain and stiffness in fingers, worse in the morning.    The following portions of the chart were reviewed this encounter and updated as appropriate:       Review of Systems:  No other skin or systemic complaints except as noted in HPI or Assessment and Plan.  Objective  Well appearing patient in no apparent distress; mood and affect are within normal limits.  A focused examination was performed including back, chest, lower legs, abdomen, face, ears. Relevant physical exam findings are noted in the Assessment and Plan.  Right Lower Leg - Anterior Well-demarcated erythematous papules/plaques with silvery scale- large plaque on left lower leg with numerous excoriations. Scattered pink scaly papules at abdomen, back.  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 It is associated with an increased risk of  inflammatory bowel disease, heart disease, non-alcoholic fatty liver disease, and depression.     Pending labs, Restart Cosentyx, send in to [facility] If not covered will consider Talz since pt also has arthritis symptoms Will submit to insurance to see if patient is covered for [facility], treat L lower leg plaque  Start clobetasol cream qd/bid to more severely aas body, avoid f/g/a  Start clobetasol/CeraVe cream mix bid to body, avoid f/g/a        QuantiFERON-TB Gold Plus - Right Lower Leg - Anterior  Clobetasol Prop Emollient Base (CLOBETASOL PROPIONATE E) 0.05 % emollient cream - Right Lower Leg - Anterior Apply 1 application topically 2 (two) times daily. Apply to affected areas for psoriasis. Avoid applying to face, groin, and axilla. Use as directed.  clobetasol (TEMOVATE) 0.05 % external solution - Right Lower Leg - Anterior Apply 1 application topically See admin instructions. Mix with cerave jar cream use as directed in handout  Comprehensive metabolic panel - Right Lower Leg - Anterior  CBC with Differential/Platelet - Right Lower Leg - Anterior  Hepatitis B surface antibody,qualitative - Right Lower Leg - Anterior  Hepatitis B surface antigen - Right Lower Leg - Anterior  Hepatitis C antibody - Right Lower Leg - Anterior  HIV Antibody (routine testing w rflx) - Right Lower Leg - Anterior   Xerosis - diffuse xerotic patches  - recommend gentle, hydrating skin care - gentle skin care handout given   Return for 1 month follow up psoriasis .  I, Ruthell Rummage, CMA, am acting as scribe for  [name], MD.  Documentation: I have reviewed the above documentation for accuracy and completeness, and I agree with the above.  [name] MD

## 2021-01-06 NOTE — Patient Instructions (Signed)
______________________________________________________________________  Preparing for Procedure with Sedation  NOTICE: Due to recent regulatory changes, starting on August 04, 2020, procedures requiring intravenous (IV) sedation will no longer be performed at the [facility].  These types of procedures are required to be performed at [facility] ambulatory surgery facility.  We are very sorry for the inconvenience.  Procedure appointments are limited to planned procedures: No Prescription Refills. No disability issues will be discussed. No medication changes will be discussed.  Instructions: Oral Intake: Do not eat or drink anything for at least 8 hours prior to your procedure. (Exception: Blood Pressure Medication. See below.) Transportation: A driver is required. You may not drive yourself after the procedure. Blood Pressure Medicine: Do not forget to take your blood pressure medicine with a sip of water the morning of the procedure. If your Diastolic (lower reading) is above 100 mmHg, elective cases will be cancelled/rescheduled. Blood thinners: These will need to be stopped for procedures. Notify our staff if you are taking any blood thinners. Depending on which one you take, there will be specific instructions on how and when to stop it. Diabetics on insulin: Notify the staff so that you can be scheduled 1st case in the morning. If your diabetes requires high dose insulin, take only  of your normal insulin dose the morning of the procedure and notify the staff that you have done so. Preventing infections: Shower with an antibacterial soap the morning of your procedure. Build-up your immune system: Take 1000 mg of Vitamin C with every meal (3 times a day) the day prior to your procedure. Antibiotics: Inform the staff if you have a condition or reason that requires you to take antibiotics before dental procedures. Pregnancy: If you are pregnant, call and cancel the procedure. Sickness: If  you have a cold, fever, or any active infections, call and cancel the procedure. Arrival: You must be in the facility at least 30 minutes prior to your scheduled procedure. Children: Do not bring children with you. Dress appropriately: Bring dark clothing that you would not mind if they get stained. Valuables: Do not bring any jewelry or valuables.  Reasons to call and reschedule or cancel your procedure: (Following these recommendations will minimize the risk of a serious complication.) Surgeries: Avoid having procedures within 2 weeks of any surgery. (Avoid for 2 weeks before or after any surgery). Flu Shots: Avoid having procedures within 2 weeks of a flu shots. (Avoid for 2 weeks before or after immunizations). Barium: Avoid having a procedure within 7-10 days after having had a radiological study involving the use of radiological contrast. (Myelograms, Barium swallow or enema study). Heart attacks: Avoid any elective procedures or surgeries for the initial 6 months after a "Myocardial Infarction" (Heart Attack). Blood thinners: It is imperative that you stop these medications before procedures. Let us know if you if you take any blood thinner.  Infection: Avoid procedures during or within two weeks of an infection (including chest colds or gastrointestinal problems). Symptoms associated with infections include: Localized redness, fever, chills, night sweats or profuse sweating, burning sensation when voiding, cough, congestion, stuffiness, runny nose, sore throat, diarrhea, nausea, vomiting, cold or Flu symptoms, recent or current infections. It is specially important if the infection is over the area that we intend to treat. Heart and lung problems: Symptoms that may suggest an active cardiopulmonary problem include: cough, chest pain, breathing difficulties or shortness of breath, dizziness, ankle swelling, uncontrolled high or unusually low blood pressure, and/or palpitations. If you are    experiencing any of these symptoms, cancel your procedure and contact your primary care physician for an evaluation.  Remember:  Regular Business hours are:  Monday to Thursday 8:00 AM to 4:00 PM  Provider's Schedule: Gunnard Dorrance, MD:  Procedure days: Tuesday and Thursday 7:30 AM to 4:00 PM  Bilal Lateef, MD:  Procedure days: Monday and Wednesday 7:30 AM to 4:00 PM ______________________________________________________________________  ____________________________________________________________________________________________  General Risks and Possible Complications  Patient Responsibilities: It is important that you read this as it is part of your informed consent. It is our duty to inform you of the risks and possible complications associated with treatments offered to you. It is your responsibility as a patient to read this and to ask questions about anything that is not clear or that you believe was not covered in this document.  Patient's Rights: You have the right to refuse treatment. You also have the right to change your mind, even after initially having agreed to have the treatment done. However, under this last option, if you wait until the last second to change your mind, you may be charged for the materials used up to that point.  Introduction: Medicine is not an exact science. Everything in Medicine, including the lack of treatment(s), carries the potential for danger, harm, or loss (which is by definition: Risk). In Medicine, a complication is a secondary problem, condition, or disease that can aggravate an already existing one. All treatments carry the risk of possible complications. The fact that a side effects or complications occurs, does not imply that the treatment was conducted incorrectly. It must be clearly understood that these can happen even when everything is done following the highest safety standards.  No treatment: You can choose not to proceed with the  proposed treatment alternative. The "PRO(s)" would include: avoiding the risk of complications associated with the therapy. The "CON(s)" would include: not getting any of the treatment benefits. These benefits fall under one of three categories: diagnostic; therapeutic; and/or palliative. Diagnostic benefits include: getting information which can ultimately lead to improvement of the disease or symptom(s). Therapeutic benefits are those associated with the successful treatment of the disease. Finally, palliative benefits are those related to the decrease of the primary symptoms, without necessarily curing the condition (example: decreasing the pain from a flare-up of a chronic condition, such as incurable terminal cancer).  General Risks and Complications: These are associated to most interventional treatments. They can occur alone, or in combination. They fall under one of the following six (6) categories: no benefit or worsening of symptoms; bleeding; infection; nerve damage; allergic reactions; and/or death. No benefits or worsening of symptoms: In Medicine there are no guarantees, only probabilities. No healthcare provider can ever guarantee that a medical treatment will work, they can only state the probability that it may. Furthermore, there is always the possibility that the condition may worsen, either directly, or indirectly, as a consequence of the treatment. Bleeding: This is more common if the patient is taking a blood thinner, either prescription or over the counter (example: Goody Powders, Fish oil, Aspirin, Garlic, etc.), or if suffering a condition associated with impaired coagulation (example: Hemophilia, cirrhosis of the liver, low platelet counts, etc.). However, even if you do not have one on these, it can still happen. If you have any of these conditions, or take one of these drugs, make sure to notify your treating physician. Infection: This is more common in patients with a compromised  immune system, either due to disease (example:   diabetes, cancer, human immunodeficiency virus [HIV], etc.), or due to medications or treatments (example: therapies used to treat cancer and rheumatological diseases). However, even if you do not have one on these, it can still happen. If you have any of these conditions, or take one of these drugs, make sure to notify your treating physician. Nerve Damage: This is more common when the treatment is an invasive one, but it can also happen with the use of medications, such as those used in the treatment of cancer. The damage can occur to small secondary nerves, or to large primary ones, such as those in the spinal cord and brain. This damage may be temporary or permanent and it may lead to impairments that can range from temporary numbness to permanent paralysis and/or brain death. Allergic Reactions: Any time a substance or material comes in contact with our body, there is the possibility of an allergic reaction. These can range from a mild skin rash (contact dermatitis) to a severe systemic reaction (anaphylactic reaction), which can result in death. Death: In general, any medical intervention can result in death, most of the time due to an unforeseen complication. ____________________________________________________________________________________________  

## 2021-01-06 NOTE — Patient Instructions (Addendum)
Eczema Skin Care  Buy TWO 16oz jars of CeraVe moisturizing cream  [facility], [facility], [facility] (no prescription needed)  Costs about $15 per jar   Jar #1: Use as a moisturizer as needed. Can be applied to any area of the body. Use twice daily to unaffected areas.  Jar #2: Pour one 31ml bottle of clobetasol 0.05% solution into jar, mix well. Label this jar to indicate the medication has been added. Use twice daily to affected areas. Do not apply to face, groin or underarms.  Moisturizer may burn or sting initially. Try for at least 4 weeks.    Topical steroids (such as triamcinolone, fluocinolone, fluocinonide, mometasone, clobetasol, halobetasol, betamethasone, hydrocortisone) can cause thinning and lightening of the skin if they are used for too long in the same area. Your physician has selected the right strength medicine for your problem and area affected on the body. Please use your medication only as directed by your physician to prevent side effects.   Avoid applying to face, groin, and axilla. Use as directed. Long-term use can cause thinning of the skin.     Gentle Skin Care Guide  1. Bathe no more than once a day.  2. Avoid bathing in hot water  3. Use a mild soap like Dove, Vanicream, Cetaphil, CeraVe. Can use Lever 2000 or Cetaphil antibacterial soap  4. Use soap only where you need it. On most days, use it under your arms, between your legs, and on your feet. Let the water rinse other areas unless visibly dirty.  5. When you get out of the bath/shower, use a towel to gently blot your skin dry, don't rub it.  6. While your skin is still a little damp, apply a moisturizing cream such as Vanicream, CeraVe, Cetaphil, Eucerin, Sarna lotion or plain Vaseline Jelly. For hands apply Neutrogena [place] Hand Cream or Excipial Hand Cream.  7. Reapply moisturizer any time you start to itch or feel dry.  8. Sometimes using free and clear laundry detergents can be helpful. Fabric  softener sheets should be avoided. Downy Free & Gentle liquid, or any liquid fabric softener that is free of dyes and perfumes, it acceptable to use  9. If your doctor has given you prescription creams you may apply moisturizers over them    If You Need Anything After Your Visit  If you have any questions or concerns for your doctor, please call our main line at [phone] and press option 4 to reach your doctor's medical assistant. If no one answers, please leave a voicemail as directed and we will return your call as soon as possible. Messages left after 4 pm will be answered the following business day.   You may also send [place] a message via [place]. We typically respond to MyChart messages within 1-2 business days.  For prescription refills, please ask your pharmacy to contact our office. Our fax number is [phone].  If you have an urgent issue when the clinic is closed that cannot wait until the next business day, you can page your doctor at the number below.    Please note that while we do our best to be available for urgent issues outside of office hours, we are not available 24/7.   If you have an urgent issue and are unable to reach [place], you may choose to seek medical care at your doctor's office, retail clinic, urgent care center, or emergency room.  If you have a medical emergency, please immediately call 911 or go to  the emergency department.  Pager Numbers  - Dr. Nehemiah Massed: [phone]  - Dr. Laurence Ferrari: [phone]  - Dr. Nicole Kindred: [phone]  In the event of inclement weather, please call our main line at [phone] for an update on the status of any delays or closures.  Dermatology Medication Tips: Please keep the boxes that topical medications come in in order to help keep track of the instructions about where and how to use these. Pharmacies typically print the medication instructions only on the boxes and not directly on the medication tubes.   If your medication  is too expensive, please contact our office at [phone] option 4 or send [place] a message through [place].   We are unable to tell what your co-pay for medications will be in advance as this is different depending on your insurance coverage. However, we may be able to find a substitute medication at lower cost or fill out paperwork to get insurance to cover a needed medication.   If a prior authorization is required to get your medication covered by your insurance company, please allow [place] 1-2 business days to complete this process.  Drug prices often vary depending on where the prescription is filled and some pharmacies may offer cheaper prices.  The website www.goodrx.com contains coupons for medications through different pharmacies. The prices here do not account for what the cost may be with help from insurance (it may be cheaper with your insurance), but the website can give you the price if you did not use any insurance.  - You can print the associated coupon and take it with your prescription to the pharmacy.  - You may also stop by our office during regular business hours and pick up a GoodRx coupon card.  - If you need your prescription sent electronically to a different pharmacy, notify our office through [facility] or by phone at [phone] option 4.     Si Usted Necesita Algo Despus de Su Visita  Tambin puede enviarnos un mensaje a travs de Pharmacist, community. Por lo general respondemos a los mensajes de MyChart en el transcurso de 1 a 2 das hbiles.  Para renovar recetas, por favor pida a su farmacia que se ponga en contacto con nuestra oficina. Harland Dingwall de fax es [place] [phone].  Si tiene un asunto urgente cuando la clnica est cerrada y que no puede esperar hasta el siguiente da hbil, puede llamar/localizar a su doctor(a) al nmero que aparece a continuacin.   Por favor, tenga en cuenta que aunque hacemos todo lo posible para estar disponibles para asuntos  urgentes fuera del horario de [place], no estamos disponibles las 24 horas del da, los 7 das de la [place].   Si tiene un problema urgente y no puede comunicarse con nosotros, puede optar por buscar atencin mdica  en el consultorio de su doctor(a), en una clnica privada, en un centro de atencin urgente o en una sala de emergencias.  Si tiene Engineering geologist, por favor llame inmediatamente al 911 o vaya a la sala de emergencias.  Nmeros de bper  - Dr. Nehemiah Massed: [phone]  - Dra. Moye: [phone]  - Dra. Nicole Kindred: [phone]  En caso de inclemencias del [place], por favor llame a [name] principal al [phone] para una actualizacin sobre el [place] de cualquier retraso o cierre.  Consejos para la medicacin en dermatologa: Por favor, guarde las cajas en las que vienen los medicamentos de uso tpico para ayudarle a seguir las instrucciones sobre dnde y cmo usarlos.  Las farmacias generalmente imprimen las instrucciones del medicamento slo en las cajas y no directamente en los tubos del [place].   Si su medicamento es muy caro, por favor, pngase en contacto con Zigmund Daniel llamando al [phone] y presione la opcin 4 o envenos un mensaje a travs de Pharmacist, community.   No podemos decirle cul ser su copago por los medicamentos por adelantado ya que esto es diferente dependiendo de la cobertura de su seguro. Sin embargo, es posible que podamos encontrar un medicamento sustituto a Electrical engineer un formulario para que el seguro cubra el medicamento que se considera necesario.   Si se requiere una autorizacin previa para que su compaa de seguros [place] su medicamento, por favor permtanos de 1 a 2 das hbiles para completar este proceso.  Los precios de los medicamentos varan con frecuencia dependiendo del Environmental consultant de dnde se surte la receta y alguna farmacias pueden ofrecer precios ms baratos.  El sitio web www.goodrx.com tiene cupones para  medicamentos de Airline pilot. Los precios aqu no tienen en cuenta lo que podra costar con la ayuda del seguro (puede ser ms barato con su seguro), pero el sitio web puede darle el precio si no utiliz Research scientist (physical sciences).  - Puede imprimir el cupn correspondiente y llevarlo con su receta a la farmacia.  - Tambin puede pasar por nuestra oficina durante el horario de atencin regular y Charity fundraiser una tarjeta de cupones de GoodRx.  - Si necesita que su receta se enve electrnicamente a una farmacia diferente, informe a nuestra oficina a travs de MyChart de [place] o por telfono llamando al [phone] y presione la opcin 4.

## 2021-01-07 LAB — CBC WITH DIFFERENTIAL/PLATELET
Basophils Absolute: 0.1 10*3/uL (ref 0.0–0.2)
Basos: 1 %
EOS (ABSOLUTE): 0.2 10*3/uL (ref 0.0–0.4)
Eos: 1 %
Hematocrit: 43.6 % (ref 37.5–51.0)
Hemoglobin: 15.1 g/dL (ref 13.0–17.7)
Immature Grans (Abs): 0 10*3/uL (ref 0.0–0.1)
Immature Granulocytes: 0 %
Lymphocytes Absolute: 2.2 10*3/uL (ref 0.7–3.1)
Lymphs: 21 %
MCH: 31.5 pg (ref 26.6–33.0)
MCHC: 34.6 g/dL (ref 31.5–35.7)
MCV: 91 fL (ref 79–97)
Monocytes Absolute: 1.1 10*3/uL — ABNORMAL HIGH (ref 0.1–0.9)
Monocytes: 11 %
Neutrophils Absolute: 6.9 10*3/uL (ref 1.4–7.0)
Neutrophils: 66 %
Platelets: 272 10*3/uL (ref 150–450)
RBC: 4.8 x10E6/uL (ref 4.14–5.80)
RDW: 12.5 % (ref 11.6–15.4)
WBC: 10.4 10*3/uL (ref 3.4–10.8)

## 2021-01-07 LAB — COMPREHENSIVE METABOLIC PANEL
ALT: 12 IU/L (ref 0–44)
AST: 20 IU/L (ref 0–40)
Albumin/Globulin Ratio: 1.7 (ref 1.2–2.2)
Albumin: 4.3 g/dL (ref 3.8–4.8)
Alkaline Phosphatase: 74 IU/L (ref 44–121)
BUN/Creatinine Ratio: 24 (ref 10–24)
BUN: 18 mg/dL (ref 8–27)
Bilirubin Total: 0.3 mg/dL (ref 0.0–1.2)
CO2: 27 mmol/L (ref 20–29)
Calcium: 9.3 mg/dL (ref 8.6–10.2)
Chloride: 99 mmol/L (ref 96–106)
Creatinine, Ser: 0.76 mg/dL (ref 0.76–1.27)
Globulin, Total: 2.6 g/dL (ref 1.5–4.5)
Glucose: 99 mg/dL (ref 70–99)
Potassium: 3.8 mmol/L (ref 3.5–5.2)
Sodium: 141 mmol/L (ref 134–144)
Total Protein: 6.9 g/dL (ref 6.0–8.5)
eGFR: 101 mL/min/{1.73_m2} (ref >59)

## 2021-01-07 LAB — HEPATITIS B SURFACE ANTIGEN: Hepatitis B Surface Ag: NEGATIVE

## 2021-01-07 LAB — HEPATITIS B SURFACE ANTIBODY,QUALITATIVE: Hep B Surface Ab, Qual: NONREACTIVE

## 2021-01-07 LAB — HIV ANTIBODY (ROUTINE TESTING W REFLEX): HIV Screen 4th Generation wRfx: NONREACTIVE

## 2021-01-07 LAB — HEPATITIS C ANTIBODY: Hep C Virus Ab: <0.1 s/co ratio (ref 0.0–0.9)

## 2021-01-08 LAB — QUANTIFERON-TB GOLD PLUS
QuantiFERON Mitogen Value: >10 IU/mL
QuantiFERON Nil Value: 0 IU/mL
QuantiFERON TB1 Ag Value: 0.02 IU/mL
QuantiFERON TB2 Ag Value: 0.02 IU/mL
QuantiFERON-TB Gold Plus: NEGATIVE

## 2021-01-10 ENCOUNTER — Other Ambulatory Visit: Payer: Self-pay | Admitting: Pulmonary Disease

## 2021-01-12 ENCOUNTER — Telehealth: Payer: Self-pay

## 2021-01-12 ENCOUNTER — Ambulatory Visit: Payer: Self-pay

## 2021-01-12 MED ORDER — COSENTYX SENSOREADY (300 MG) 150 MG/ML ~~LOC~~ SOAJ: 300.0000 mg | SUBCUTANEOUS | 6 refills | Status: DC

## 2021-01-12 MED ORDER — COSENTYX SENSOREADY (300 MG) 150 MG/ML ~~LOC~~ SOAJ: 300.0000 mg | SUBCUTANEOUS | 0 refills | Status: DC

## 2021-01-12 NOTE — Telephone Encounter (Signed)
-----   Message from [name], MD sent at 01/12/2021 12:33 PM EST ----- Baseline labs okay, TB neg., Hep panel neg, HIV neg, send in Cosentyx as per note - please call patient.

## 2021-01-12 NOTE — Telephone Encounter (Signed)
°  Chief Complaint: back pain d/t fall Symptoms: low back pain Frequency: 4 days Pertinent Negatives: Patient denies weakness/numbness to legs Disposition: [] ED /[] Urgent Care (no appt availability in office) / [x] Appointment(In office/virtual)/ []  [place] [facility]/ [] Home Care/ [] Refused Recommended Disposition /[] [facility]/ []  Follow-up with PCP Additional Notes: Pt tripped and fell down stairs on Thursday evening. He states he fell on his butt and elbows. Had scrapes to both elbows and R arm/shoulder was hurting but has improved. He is concerned about his lower back pain and says he has difficulty going up the steps now. Pt has had previous back sx and wants to make sure nothing is injured. Scheduled appt for 01/13/21 @ 1400. Care advice given and pt verbalized understanding. No other questions/concerns noted.     Reason for Disposition  [1] MODERATE weakness (i.e., interferes with work, school, normal activities) AND [2] new-onset or worsening  Answer Assessment - Initial Assessment Questions 1. MECHANISM: "How did the fall happen?"     Tripped down stairs 2. DOMESTIC VIOLENCE AND ELDER ABUSE SCREENING: "Did you fall because someone pushed you or tried to hurt you?" If Yes, ask: "Are you safe now?"     No 3. ONSET: "When did the fall happen?" (e.g., minutes, hours, or days ago)     Thursday evening 4. LOCATION: "What part of the body hit the ground?" (e.g., back, buttocks, head, hips, knees, hands, head, stomach)     Butt and knees 5. INJURY: "Did you hurt (injure) yourself when you fell?" If Yes, ask: "What did you injure? Tell me more about this?" (e.g., body area; type of injury; pain severity)"     R shoulder was hurting, lower back hurting 6. PAIN: "Is there any pain?" If Yes, ask: "How bad is the pain?" (e.g., Scale 1-10; or mild,  moderate, severe)   - NONE (0): No pain   - MILD (1-3): Doesn't interfere with normal activities    - MODERATE (4-7): Interferes  with normal activities or awakens from sleep    - SEVERE (8-10): Excruciating pain, unable to do any normal activities      7 when walking 7. SIZE: For cuts, bruises, or swelling, ask: "How large is it?" (e.g., inches or centimeters)      Scuffs to both elbows 9. OTHER SYMPTOMS: "Do you have any other symptoms?" (e.g., dizziness, fever, weakness; new onset or worsening).      weakness 10. CAUSE: "What do you think caused the fall (or falling)?" (e.g., tripped, dizzy spell)       tripped  Protocols used: Falls and [facility]

## 2021-01-12 NOTE — Telephone Encounter (Signed)
Advised pt of bx results.  Advised pt we would send Cosentyx to [facility].  Cosentyx sent to [facility]

## 2021-01-13 ENCOUNTER — Ambulatory Visit
Admission: RE | Admit: 2021-01-13 | Discharge: 2021-01-13 | Disposition: A | Discharge status: Home / Self Care | Payer: Medicaid Other | Attending: Family Medicine | Admitting: Family Medicine

## 2021-01-13 ENCOUNTER — Other Ambulatory Visit: Payer: Self-pay

## 2021-01-13 ENCOUNTER — Encounter: Payer: Self-pay | Admitting: Family Medicine

## 2021-01-13 ENCOUNTER — Ambulatory Visit
Admission: RE | Admit: 2021-01-13 | Discharge: 2021-01-13 | Disposition: A | Discharge status: Home / Self Care | Payer: Medicaid Other | Source: Ambulatory Visit | Attending: Family Medicine | Admitting: Family Medicine

## 2021-01-13 ENCOUNTER — Ambulatory Visit: Payer: Medicaid Other | Admitting: Family Medicine

## 2021-01-13 VITALS — BP 122/79 | HR 76 | Temp 98.7°F | Resp 18 | Ht 68.0 in | Wt 198.0 lb

## 2021-01-13 DIAGNOSIS — M545 Low back pain, unspecified: Secondary | ICD-10-CM | POA: Insufficient documentation

## 2021-01-13 NOTE — Telephone Encounter (Signed)
Noted  

## 2021-01-13 NOTE — Progress Notes (Signed)
° ° °  SUBJECTIVE:   CHIEF COMPLAINT / HPI:   BACK PAIN - fell down stairs last Thursday, landed on R side. Able to get up but with significant low back pain - seeing pain management for cervicalgia, getting injections in neck, goes back Thursday.  - prior lumbar laminectomy and cervical fusion x2 1980, 1990s.  Duration: days Mechanism of injury:  fall Location: R/center low back Onset: sudden Severity: mod-severe Frequency: constant Radiation: none Aggravating factors: steps Alleviating factors: nothing Status: persistent Treatments attempted: heat, tylenol, ibuprofen 800mg  prn, norco, lyrica Relief with NSAIDs?: no Nighttime pain:  no Paresthesias / decreased sensation:  no Bowel / bladder incontinence:  no Fevers:  no Dysuria / urinary frequency:  no   OBJECTIVE:   BP 122/79 (BP Location: Left Arm, Patient Position: Sitting, Cuff Size: Large)    Pulse 76    Temp 98.7 F (37.1 C) (Temporal)    Resp 18    Ht 5\' 8"  (1.727 m)    Wt 198 lb (89.8 kg)    SpO2 96%    BMI 30.11 kg/m   Gen: well appearing, in NAD MSK: no asymmetry. Fair bulk and tone. Midline spinal tenderness over lower lumbar and sacral vertebrae. Some bilateral lumbar paravertebral hypertonicity but nonTTP. Antalgic gait. AROM limited in flexion/extenion but reports this is chronic. No tenderness over piriformis.   ASSESSMENT/PLAN:   Back pain Significantly worsened s/p fall. With midline spinal tenderness and h/o back surgeries, will obtain XR. No saddle anesthesia or red flags to concern for neural compromise warranting emergent MRI. Recommend continued tylenol/NSAID for pain control, additionally on chronic narcotics for cervicalgia. May benefit from donut cushion.   Myles Gip, DO

## 2021-01-14 NOTE — Patient Instructions (Signed)

## 2021-01-14 NOTE — Progress Notes (Addendum)
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   Medications administered: We administered iohexol, lidocaine, lactated ringers, midazolam, sodium chloride flush, ropivacaine (PF) 2 mg/mL (0.2%), and dexamethasone.  Follow-up plan:   Return in about 2 weeks (around 01/29/2021) for Proc-day (T,Th), (VV), (PPE).      Interventional Therapies  Risk   Complexity Considerations:   Estimated body mass index is 31.93 kg/m as calculated from the following:   Height as of this encounter: 5\' 8"  (1.727 m).   Weight as of this encounter: 210 lb (95.3 kg). WNL   Planned   Pending:   Therapeutic left cervical ESI (01/15/2021)   Under consideration:   Possible spinal cord stimulator trial  Possible bilateral cervical facet RFA    Completed:   Therapeutic/palliative left CESI x8 (12/18/2020) (6-0) (100/100/80/75-80)  Diagnostic bilateral cervical facet MBB x2 (09/22/2017) (5-1) (100/100/100/>50)  Diagnostic  bilateral lumbar facet MBB x1 (01/29/2020) (3-0) (100/100/90/90)  Diagnostic left SI joint block x1 (06/19/2019) (8-0) (100/100/100 x3 days/0)  Therapeutic midline serratus posterior TPI/MNB x1 (01/11/2019)  Diagnostic right Common Peroneal NB (C-PNB) x2 (04/14/2017) (5-0) (100/100/20/<25)  Therapeutic right common peroneal nerve (C-PN) RFA x1 (06/21/2017) (3-0) (100/100/100 x 3 days/75-100)  Diagnostic right superficial peroneal (S-PN) NB x5 (08/19/2020) (7-3) (100/100/100/90-100) Therapeutic right superficial peroneal nerve (S-PN) RFA x1 (10/25/2017) (4-0) (100/100/100 x1 day/25) Therapeutic right deep peroneal NB (D-PNB) x1 (05/03/2019)    Therapeutic   Palliative (PRN) options:   Palliative left CESI  Diagnostic bilateral cervical facet block #3  Palliative right superficial peroneal NB #5  Palliative right superficial peroneal nerve RFA #2     Recent Visits Date Type Provider Dept  01/06/21 Office Visit Milinda Pointer, MD [facility]  12/18/20 Procedure visit Milinda Pointer, MD [facility]  11/26/20 Office Visit Milinda Pointer, MD [facility]  Showing recent visits within past 90 days and meeting all other requirements Today's Visits Date Type Provider Dept  01/15/21 Procedure visit Milinda Pointer, MD [facility]  Showing today's visits and meeting all other requirements Future Appointments Date Type Provider Dept  01/29/21 Appointment Milinda Pointer, MD [facility]  02/23/21 Appointment Milinda Pointer, MD [facility]  Showing future appointments within next 90 days and meeting all other requirements   Disposition: Discharge home  Discharge (Date   Time): 01/15/2021; 0851 hrs.   Primary Care Physician: Virginia Crews, MD Location: [facility] Note by: Gaspar Cola, MD Date: 01/15/2021; Time: 12:30 PM  DISCLAIMER: Medicine is not an Chief Strategy Officer. It has no  guarantees or warranties. The decision to proceed with this intervention was based on the information collected from the patient. Conclusions were drawn from the patient's questionnaire, interview, and examination. Because information was provided in large part by the patient, it cannot be guaranteed that it has not been purposely or unconsciously manipulated or altered. Every effort has been made to obtain as much accurate, relevant, available data as possible. Always take into account that the treatment will also be dependent on availability of resources and existing treatment guidelines, considered by other Pain Management Specialists as being common knowledge and practice, at the time of the intervention. It is also important to point out that variation in procedural techniques and pharmacological  choices are the acceptable norm. For Medico-Legal review purposes, the indications, contraindications, technique, and results of the these procedures should only be evaluated, judged and interpreted by a Board-Certified Interventional Pain Specialist with extensive familiarity and expertise in the same exact procedure and technique.

## 2021-01-15 ENCOUNTER — Encounter: Payer: Self-pay | Admitting: Pain Medicine

## 2021-01-15 ENCOUNTER — Ambulatory Visit
Admission: RE | Admit: 2021-01-15 | Discharge: 2021-01-15 | Disposition: A | Discharge status: Home / Self Care | Payer: Medicaid Other | Source: Ambulatory Visit | Attending: Pain Medicine | Admitting: Pain Medicine

## 2021-01-15 ENCOUNTER — Ambulatory Visit (HOSPITAL_BASED_OUTPATIENT_CLINIC_OR_DEPARTMENT_OTHER): Payer: Medicaid Other | Admitting: Pain Medicine

## 2021-01-15 ENCOUNTER — Other Ambulatory Visit: Payer: Self-pay

## 2021-01-15 VITALS — BP 100/69 | HR 74 | Temp 97.9°F | Resp 22 | Ht 68.0 in | Wt 198.0 lb

## 2021-01-15 DIAGNOSIS — M5030 Other cervical disc degeneration, unspecified cervical region: Secondary | ICD-10-CM | POA: Insufficient documentation

## 2021-01-15 DIAGNOSIS — R29898 Other symptoms and signs involving the musculoskeletal system: Secondary | ICD-10-CM

## 2021-01-15 DIAGNOSIS — M79602 Pain in left arm: Secondary | ICD-10-CM | POA: Insufficient documentation

## 2021-01-15 DIAGNOSIS — M542 Cervicalgia: Secondary | ICD-10-CM | POA: Diagnosis not present

## 2021-01-15 DIAGNOSIS — M5010 Cervical disc disorder with radiculopathy, unspecified cervical region: Secondary | ICD-10-CM

## 2021-01-15 DIAGNOSIS — M4802 Spinal stenosis, cervical region: Secondary | ICD-10-CM | POA: Insufficient documentation

## 2021-01-15 DIAGNOSIS — M47812 Spondylosis without myelopathy or radiculopathy, cervical region: Secondary | ICD-10-CM | POA: Diagnosis not present

## 2021-01-15 DIAGNOSIS — G8929 Other chronic pain: Secondary | ICD-10-CM

## 2021-01-15 DIAGNOSIS — M4722 Other spondylosis with radiculopathy, cervical region: Secondary | ICD-10-CM

## 2021-01-15 DIAGNOSIS — M5412 Radiculopathy, cervical region: Secondary | ICD-10-CM | POA: Insufficient documentation

## 2021-01-15 MED ORDER — DEXAMETHASONE SODIUM PHOSPHATE 10 MG/ML IJ SOLN
INTRAMUSCULAR | Status: AC
  Filled 2021-01-15: qty 1

## 2021-01-15 MED ORDER — IOHEXOL 180 MG/ML  SOLN
10.0000 mL | Freq: Once | INTRAMUSCULAR | Status: AC
  Administered 2021-01-15: 10 mL via EPIDURAL

## 2021-01-15 MED ORDER — LACTATED RINGERS IV SOLN
1000.0000 mL | Freq: Once | INTRAVENOUS | Status: AC
  Administered 2021-01-15: 1000 mL via INTRAVENOUS

## 2021-01-15 MED ORDER — ROPIVACAINE HCL 2 MG/ML IJ SOLN
INTRAMUSCULAR | Status: AC
  Filled 2021-01-15: qty 20

## 2021-01-15 MED ORDER — SODIUM CHLORIDE (PF) 0.9 % IJ SOLN
INTRAMUSCULAR | Status: AC
  Filled 2021-01-15: qty 10

## 2021-01-15 MED ORDER — ROPIVACAINE HCL 2 MG/ML IJ SOLN
1.0000 mL | Freq: Once | INTRAMUSCULAR | Status: AC
  Administered 2021-01-15: 1 mL via EPIDURAL

## 2021-01-15 MED ORDER — IOHEXOL 180 MG/ML  SOLN
INTRAMUSCULAR | Status: AC
  Filled 2021-01-15: qty 20

## 2021-01-15 MED ORDER — DEXAMETHASONE SODIUM PHOSPHATE 10 MG/ML IJ SOLN
10.0000 mg | Freq: Once | INTRAMUSCULAR | Status: AC
  Administered 2021-01-15: 10 mg

## 2021-01-15 MED ORDER — LIDOCAINE HCL 2 % IJ SOLN
20.0000 mL | Freq: Once | INTRAMUSCULAR | Status: AC
  Administered 2021-01-15: 400 mg

## 2021-01-15 MED ORDER — MIDAZOLAM HCL 5 MG/5ML IJ SOLN
0.5000 mg | Freq: Once | INTRAMUSCULAR | Status: AC
  Administered 2021-01-15: 2 mg via INTRAVENOUS

## 2021-01-15 MED ORDER — PENTAFLUOROPROP-TETRAFLUOROETH EX AERO
INHALATION_SPRAY | Freq: Once | CUTANEOUS | Status: DC
  Filled 2021-01-15: qty 116

## 2021-01-15 MED ORDER — SODIUM CHLORIDE 0.9% FLUSH
1.0000 mL | Freq: Once | INTRAVENOUS | Status: AC
  Administered 2021-01-15: 1 mL

## 2021-01-15 MED ORDER — LIDOCAINE HCL 2 % IJ SOLN
INTRAMUSCULAR | Status: AC
  Filled 2021-01-15: qty 20

## 2021-01-15 MED ORDER — MIDAZOLAM HCL 5 MG/5ML IJ SOLN
INTRAMUSCULAR | Status: AC
  Filled 2021-01-15: qty 5

## 2021-01-16 ENCOUNTER — Telehealth: Payer: Self-pay

## 2021-01-16 NOTE — Telephone Encounter (Signed)
Post procedure phone call. Patient states he is doing well.  
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
## 2021-01-19 ENCOUNTER — Encounter: Payer: Self-pay | Admitting: Cardiovascular Disease

## 2021-01-19 ENCOUNTER — Ambulatory Visit: Payer: Medicaid Other | Admitting: Cardiovascular Disease

## 2021-01-19 ENCOUNTER — Other Ambulatory Visit: Payer: Self-pay

## 2021-01-19 VITALS — BP 100/64 | HR 72 | Ht 68.0 in | Wt 198.4 lb

## 2021-01-19 DIAGNOSIS — E785 Hyperlipidemia, unspecified: Secondary | ICD-10-CM

## 2021-01-19 DIAGNOSIS — I4729 Other ventricular tachycardia: Secondary | ICD-10-CM | POA: Diagnosis not present

## 2021-01-19 DIAGNOSIS — J449 Chronic obstructive pulmonary disease, unspecified: Secondary | ICD-10-CM

## 2021-01-19 DIAGNOSIS — I25118 Atherosclerotic heart disease of native coronary artery with other forms of angina pectoris: Secondary | ICD-10-CM | POA: Diagnosis not present

## 2021-01-19 DIAGNOSIS — Z720 Tobacco use: Secondary | ICD-10-CM

## 2021-01-19 DIAGNOSIS — I10 Essential (primary) hypertension: Secondary | ICD-10-CM | POA: Diagnosis not present

## 2021-01-19 MED ORDER — VARENICLINE TARTRATE 1 MG PO TABS: 1.0000 mg | ORAL_TABLET | Freq: Two times a day (BID) | ORAL | 3 refills | Status: DC

## 2021-01-19 NOTE — Patient Instructions (Addendum)
Medication Instructions:  Please START  Chantix (for smoke sensation) 1 mg by mouth twice a day  If you need a refill on your cardiac medications before your next appointment, please call your pharmacy.   Lab work: No new labs needed  Testing/Procedures: No new testing needed  Follow-Up: At [facility], you and your health needs are our priority.  As part of our continuing mission to provide you with exceptional heart care, we have created designated Provider Care Teams.  These Care Teams include your primary Cardiologist (physician) and Advanced Practice Providers (APPs -  Physician Assistants and Nurse Practitioners) who all work together to provide you with the care you need, when you need it.  You will need a follow up appointment in 12 months  Providers on your designated Care Team:   Murray Hodgkins, NP Christell Faith, PA-C Cadence Kathlen Mody, [place]  COVID-19 Vaccine Information can be found at: [email or web address] For questions related to vaccine distribution or appointments, please email [email or web address] or call [phone].

## 2021-01-21 ENCOUNTER — Other Ambulatory Visit: Payer: Self-pay | Admitting: Family Medicine

## 2021-01-21 DIAGNOSIS — M7918 Myalgia, other site: Secondary | ICD-10-CM

## 2021-01-21 DIAGNOSIS — G8929 Other chronic pain: Secondary | ICD-10-CM

## 2021-01-26 ENCOUNTER — Telehealth: Payer: Self-pay

## 2021-01-26 NOTE — Telephone Encounter (Signed)
This patients insurance denied Cosentyx. Do you want to appeal or try Taltz?

## 2021-01-28 NOTE — Progress Notes (Signed)
Patient: Caleb [name]  Service Category: E/M  Provider: Gaspar Cola, MD  DOB: [birth year]  DOS: 01/29/2021  Location: Office  MRN: [id number]  Setting: Ambulatory outpatient  Referring Provider: Virginia Crews, MD  Type: Established Patient  Specialty: Interventional Pain Management  PCP: Virginia Crews, MD  Location: Remote location  Delivery: TeleHealth     Virtual Encounter - Pain Management PROVIDER NOTE: Information contained herein reflects review and annotations entered in association with encounter. Interpretation of such information and data should be left to medically-trained personnel. Information provided to patient can be located elsewhere in the medical record under "Patient Instructions". Document created using STT-dictation technology, any transcriptional errors that may result from process are unintentional.    Contact & Pharmacy Preferred: [phone] Home: [phone] (home) Mobile: [phone] (mobile) E-mail: [email or web address]  [facility] [address]Phone: [phone]: [phone] [id number]-[place] [place]Phone: [phone]: [phone] LLC - [place] [place] [address]Phone: [phone]: [phone]  Pre-screening  Mr. HLadayoffered "in-person" vs "virtual" encounter. He indicated preferring virtual for this encounter.   Reason COVID-19*   Social distancing based on CDC and AMA recommendations.   I contacted Caleb Capronon 01/29/2021 via telephone.      I clearly identified myself as FGaspar Cola MD. I verified that I was speaking with the correct person using two identifiers (Name: Caleb [name] and date of birth: [birth year].  Consent I sought verbal advanced  consent from Caleb [name] virtual visit interactions. I informed Mr. HBetzerof possible security and privacy concerns, risks, and limitations associated with providing "not-in-person" medical evaluation and management services. I also informed Mr. HBynumof the availability of "in-person" appointments. Finally, I informed him that there would be a charge for the virtual visit and that he could be  personally, fully or partially, financially responsible for it. Mr. HCrabbeexpressed understanding and agreed to proceed.   Historic Elements   [name] a 66y.o. year old, male patient evaluated today after our last contact on 01/15/2021. [name] has a past medical history of Chronic foot pain, right (2015), COPD (chronic obstructive pulmonary disease) ([place], Coronary artery disease, Cough syncope, Emphysema lung ([place], Family history of adverse reaction to anesthesia, History of kidney stones, Hyperlipidemia, Hypertension, Kidney stone, Leucocytosis, [place]ocardial infarction ([place] ([place]), and OSA on CPAP. He also  has a past surgical history that includes Lumbar laminectomy (1989, 1999); Cervical fusion (1988, 1998); Liver surgery (2015); Partial colectomy (1990); Inguinal hernia repair (Bilateral, 1975); Lithotripsy; Colonoscopy with propofol (N/A, 02/24/2017); Cardiac catheterization (2015); Arthrodesis metatarsal (Right, 11/26/2019); and Lesion removal (Right, 11/26/2019). Mr. HMurakamihas a current medication list which includes the following prescription(s): albuterol, anoro ellipta, aspirin ec, cetirizine, clobetasol, clobetasol prop emollient base, fluocinolone, fluticasone, hydrochlorothiazide, hydrocodone-acetaminophen, hydrocodone-acetaminophen, hydrocodone-acetaminophen, ibuprofen, isosorbide mononitrate, ketorolac, lidocaine, losartan, melatonin, methocarbamol, mometasone, montelukast, naloxone, nitroglycerin, pregabalin, rosuvastatin, cosentyx sensoready (300 mg), cosentyx sensoready  (300 mg), tadalafil, triamcinolone ointment, turmeric, and varenicline. He  reports that he has been smoking cigarettes. He has a 25.50 pack-year smoking history. He has quit using smokeless tobacco. He reports current alcohol use of about 4.0 standard drinks per week. He reports that he does not use drugs. Mr.  [name] is allergic to lisinopril and oxycodone.   HPI  Today, he is being contacted for a post-procedure assessment.  According to the patient he again attained 100% relief of the pain for the duration of the local anesthetic which eventually went down to a 90% improvement that lasted for 1 week.  Unfortunately, his pain has began to come back and he was to proceed with 1/3 injection since the last time he had a series of 3 cervical epidural steroid injections he attained almost 6 months of complete pain relief.  Today we talked about the possibility of surgery and he indicated that the surgeon had recommended surgery for his his severe stenosis, but he wanted to avoid it.  We also looked at alternatives, but I reminded him that the only thing that can really improve this pain is that the compression of those nerves.  I think that he is beginning to realize what I had told him that this can continue to worsen with time.  He still wants to try the third cervical epidural steroid injection, and if by chance that does not help, I have recommended that he revisit the possibility of surgery.  He refers being scared to have surgery secondary to COVID.  As it turns out, he has not had the COVID vaccinations or flu vaccines.  I really do not know why and I have recommended that he seriously consider other getting all of those injections done since they are not likely to help him if he was to again contract COVID.  He has already contracted COVID in the past and according to his description of the event, his fever went up to 22 F and he continues to have some persistent respiratory problems after that event.  I  reminded him that none of [place] are getting any younger and that he needs to consider doing these surgeries when he is still in a situation where he is medically fit to undergo the procedure.  The plan is to bring him back and do the third cervical epidural steroid injection if it does not help, he will be going back to [facility] for further consideration on 2 having the cervical decompression.  Post-procedure evaluation   Type: Cervical Epidural Steroid injection (ESI) (Interlaminar) #2  Laterality: Left  Level: C7-T1 Imaging: Fluoroscopy-assisted DOS: 01/15/2021  Performed by: Milinda Pointer, MD Anesthesia: Local anesthesia (1-2% Lidocaine) Anxiolysis: None Sedation: None.   Purpose: Diagnostic/Therapeutic Indications: Cervicalgia, cervical radicular pain, degenerative disc disease, severe enough to impact quality of life or function. 1. DDD (degenerative disc disease), cervical   2. Cervicalgia   3. Chronic upper extremity pain (Left)   4. Chronic upper extremity weakness (Left)   5. 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 Cervical spondylosis w/ radiculopathy   7. 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 Cervical disc disorder with radiculopathy of cervical region   9. Cervical facet syndrome (Bilateral)    NAS-11 score:   Pre-procedure: 5 /10   Post-procedure: 0-No pain/10    Effectiveness:  Initial hour after procedure: 100 %. Subsequent 4-6 hours post-procedure: 100 %. Analgesia past initial 6 hours: 90 % (lasting 1 week). Ongoing improvement:  Analgesic: The patient indicates that unfortunately the pain has returned.  He says that he wants to avoid having any cervical surgery.  He wants to proceed with the third injection since the last time we did a series of 3 he attained 6 months of complete relief of the pain.  Function: Somewhat improved ROM: Somewhat improved  Pharmacotherapy Assessment   Opioid Analgesic:  Hydrocodone/APAP 5/325 1 tablet every 6 hours (20 mg/day of hydrocodone).  (Unable to tolerate an oxycodone IR trial due to stomachaches, nausea, headaches, and excessive somnolence.  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   Monitoring: [place] PMP: PDMP not reviewed this encounter.       Pharmacotherapy: No side-effects or adverse reactions reported. Compliance: No problems identified. Effectiveness: Clinically acceptable. Plan: Refer to "POC". UDS:  Summary  Date Value Ref Range Status  08/27/2020 Note  Final    Comment:    ==================================================================== [employer] Select 13 (MW) ==================================================================== Test                             Result       Flag       Units  Drug Present and Declared for Prescription Verification   Hydrocodone                    1811         EXPECTED   ng/mg creat   Hydromorphone                  617          EXPECTED   ng/mg creat   Dihydrocodeine                 114          EXPECTED   ng/mg creat   Norhydrocodone                 1799         EXPECTED   ng/mg creat    Sources of hydrocodone include scheduled prescription medications.    Hydromorphone, dihydrocodeine and norhydrocodone are expected    metabolites of hydrocodone. Hydromorphone and dihydrocodeine are    also available as scheduled prescription medications.  Drug Present not Declared for Prescription Verification   Oxazepam                       42           UNEXPECTED ng/mg creat   Temazepam                      41           UNEXPECTED ng/mg creat    Oxazepam and temazepam are expected metabolites of diazepam.    Oxazepam is also an expected metabolite of other benzodiazepine    drugs, including chlordiazepoxide, prazepam, clorazepate, halazepam,    and temazepam.  Oxazepam and temazepam are available as scheduled    prescription  medications.  ==================================================================== Test                      Result    Flag   Units      Ref Range   Creatinine              76               mg/dL      >=20 ==================================================================== Declared Medications:  The flagging and interpretation on this report are based on the  following declared medications.  Unexpected results may arise from  inaccuracies in the declared medications.   **Note: The testing scope of this panel includes these medications:  Hydrocodone (Norco)   **Note: The testing scope of this panel does not include the  following reported medications:   Acetaminophen (Norco)  Albuterol (Ventolin HFA)  Aspirin  Cetirizine (Zyrtec)  Fluticasone (Flonase)  Hydrochlorothiazide (Hydrodiuril)  Ibuprofen (Advil)  Isosorbide (Imdur)  Losartan (Cozaar)  Melatonin  Methocarbamol (Robaxin)  Mometasone  Montelukast (Singulair)  Naloxone (Narcan)  Nitroglycerin (Nitrostat)  Pregabalin (Lyrica)  Rosuvastatin (Crestor)  Topical  Umeclidinium (Anoro)  Varenicline (Chantix)  Vilanterol (Anoro) ==================================================================== For clinical consultation, please call [phone]. ====================================================================      Laboratory Chemistry Profile   Renal Lab Results  Component Value Date   BUN 18 01/06/2021   CREATININE 0.76 01/06/2021   BCR 24 01/06/2021   GFRAA 112 09/20/2019   GFRNONAA >60 02/23/2020    Hepatic Lab Results  Component Value Date   AST 20 01/06/2021   ALT 12 01/06/2021   ALBUMIN 4.3 01/06/2021   ALKPHOS 74 01/06/2021    Electrolytes Lab Results  Component Value Date   NA 141 01/06/2021   K 3.8 01/06/2021   CL 99 01/06/2021   CALCIUM 9.3 01/06/2021   MG 2.0 05/30/2018    Bone Lab Results  Component Value Date   25OHVITD1 32 02/17/2017   25OHVITD2 <1.0 02/17/2017    25OHVITD3 32 02/17/2017   TESTOFREE 5.2 (L) 02/28/2017   TESTOSTERONE 279 02/28/2017    Inflammation (CRP: Acute Phase) (ESR: Chronic Phase) Lab Results  Component Value Date   CRP 8 11/07/2017   ESRSEDRATE 26 11/07/2017         Note: Above Lab results reviewed.  Imaging  DG PAIN CLINIC C-ARM 1-60 MIN NO REPORT Fluoro was used, but no Radiologist interpretation will be provided.  Please refer to "NOTES" tab for provider progress note.  Assessment  The primary encounter diagnosis was Cervicalgia. 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  Plan of Care  Problem-specific:  No problem-specific Assessment & Plan notes found for this encounter.  Caleb [name] has a current medication list which includes the following long-term medication(s): albuterol, cetirizine, fluticasone, hydrochlorothiazide, hydrocodone-acetaminophen, hydrocodone-acetaminophen, hydrocodone-acetaminophen, isosorbide mononitrate, losartan, methocarbamol, montelukast, nitroglycerin, pregabalin, rosuvastatin, and tadalafil.  Pharmacotherapy (Medications Ordered): No orders of the defined types were placed in this encounter.  Orders:  Orders Placed This Encounter  Procedures   Cervical Epidural Injection    Sedation: Patient's choice. Purpose: Therapeutic Indication(s): Radiculitis and cervicalgia associater with cervical degenerative disc disease.    Standing Status:   Future    Standing Expiration Date:   04/29/2021    Scheduling Instructions:     Procedure: Cervical  Epidural Steroid Injection/Block     Level(s): C7-T1     Laterality: Left-sided     Timeframe: As soon as schedule allows    Order Specific Question:   Where will this procedure be performed?    Answer:   [facility]    Comments:   by Dr. Dossie Arbour   Follow-up plan:   Return for [facility]) procedure: (L) CESI #3, (Sed-anx).     Interventional Therapies  Risk   Complexity Considerations:   Estimated body mass index is 31.93 kg/m as calculated from the following:   Height as of  this encounter: _0  (1.727 m).   Weight as of this encounter: 210 lb (95.3 kg). WNL   Planned   Pending:   Therapeutic left cervical ESI (01/15/2021)   Under consideration:   Possible spinal cord stimulator trial  Possible bilateral cervical facet RFA    Completed:   Therapeutic/palliative left CESI x8 (12/18/2020) (6-0) (100/100/80/75-80)  Diagnostic bilateral cervical facet MBB x2 (09/22/2017) (5-1) (100/100/100/>50)  Diagnostic bilateral lumbar facet MBB x1 (01/29/2020) (3-0) (100/100/90/90)  Diagnostic left SI joint block x1 (06/19/2019) (8-0) (100/100/100 x3 days/0)  Therapeutic midline serratus posterior TPI/MNB x1 (01/11/2019)  Diagnostic right Common Peroneal NB (C-PNB) x2 (04/14/2017) (5-0) (100/100/20/<25)  Therapeutic right common peroneal nerve (C-PN) RFA x1 (06/21/2017) (3-0) (100/100/100 x 3 days/75-100)  Diagnostic right superficial peroneal (S-PN) NB x5 (08/19/2020) (7-3) (100/100/100/90-100) Therapeutic right superficial peroneal nerve (S-PN) RFA x1 (10/25/2017) (4-0) (100/100/100 x1 day/25) Therapeutic right deep peroneal NB (D-PNB) x1 (05/03/2019)    Therapeutic   Palliative (PRN) options:   Palliative left CESI  Diagnostic bilateral cervical facet block #3  Palliative right superficial peroneal NB #5  Palliative right superficial peroneal nerve RFA #2      Recent Visits Date Type Provider Dept  01/15/21 Procedure visit Milinda Pointer, MD [facility]  01/06/21 Office  Visit Milinda Pointer, MD [facility]  12/18/20 Procedure visit Milinda Pointer, MD [facility]  11/26/20 Office Visit Milinda Pointer, MD [facility]  Showing recent visits within past 90 days and meeting all other requirements Today's Visits Date Type Provider Dept  01/29/21 Office Visit Milinda Pointer, MD [facility]  Showing today's visits and meeting all other requirements Future Appointments Date Type Provider Dept  02/23/21 Appointment Milinda Pointer, MD [facility]  Showing future appointments within next 90 days and meeting all other requirements  I discussed the assessment and treatment plan with the patient. The patient was provided an opportunity to ask questions and all were answered. The patient agreed with the plan and demonstrated an understanding of the instructions.  Patient advised to call back or seek an in-person evaluation if the symptoms or condition worsens.  Duration of encounter: 23 minutes.  Note by: Gaspar Cola, MD Date: 01/29/2021; Time: 3:14 PM

## 2021-01-29 ENCOUNTER — Ambulatory Visit: Payer: Medicaid Other | Attending: Pain Medicine | Admitting: Pain Medicine

## 2021-01-29 ENCOUNTER — Other Ambulatory Visit: Payer: Self-pay

## 2021-01-29 DIAGNOSIS — M79602 Pain in left arm: Secondary | ICD-10-CM | POA: Diagnosis not present

## 2021-01-29 DIAGNOSIS — M47812 Spondylosis without myelopathy or radiculopathy, cervical region: Secondary | ICD-10-CM

## 2021-01-29 DIAGNOSIS — M4722 Other spondylosis with radiculopathy, cervical region: Secondary | ICD-10-CM | POA: Diagnosis not present

## 2021-01-29 DIAGNOSIS — M5412 Radiculopathy, cervical region: Secondary | ICD-10-CM | POA: Diagnosis not present

## 2021-01-29 DIAGNOSIS — R937 Abnormal findings on diagnostic imaging of other parts of musculoskeletal system: Secondary | ICD-10-CM

## 2021-01-29 DIAGNOSIS — G8929 Other chronic pain: Secondary | ICD-10-CM | POA: Diagnosis not present

## 2021-01-29 DIAGNOSIS — M5030 Other cervical disc degeneration, unspecified cervical region: Secondary | ICD-10-CM

## 2021-01-29 DIAGNOSIS — M5010 Cervical disc disorder with radiculopathy, unspecified cervical region: Secondary | ICD-10-CM

## 2021-01-29 DIAGNOSIS — M4802 Spinal stenosis, cervical region: Secondary | ICD-10-CM | POA: Diagnosis not present

## 2021-01-29 DIAGNOSIS — R29898 Other symptoms and signs involving the musculoskeletal system: Secondary | ICD-10-CM

## 2021-01-29 DIAGNOSIS — M542 Cervicalgia: Secondary | ICD-10-CM

## 2021-01-29 NOTE — Patient Instructions (Signed)
______________________________________________________________________  Preparing for Procedure with Sedation  NOTICE: Due to recent regulatory changes, starting on August 04, 2020, procedures requiring intravenous (IV) sedation will no longer be performed at the [facility].  These types of procedures are required to be performed at [facility] ambulatory surgery facility.  We are very sorry for the inconvenience.  Procedure appointments are limited to planned procedures: No Prescription Refills. No disability issues will be discussed. No medication changes will be discussed.  Instructions: Oral Intake: Do not eat or drink anything for at least 8 hours prior to your procedure. (Exception: Blood Pressure Medication. See below.) Transportation: A driver is required. You may not drive yourself after the procedure. Blood Pressure Medicine: Do not forget to take your blood pressure medicine with a sip of water the morning of the procedure. If your Diastolic (lower reading) is above 100 mmHg, elective cases will be cancelled/rescheduled. Blood thinners: These will need to be stopped for procedures. Notify our staff if you are taking any blood thinners. Depending on which one you take, there will be specific instructions on how and when to stop it. Diabetics on insulin: Notify the staff so that you can be scheduled 1st case in the morning. If your diabetes requires high dose insulin, take only  of your normal insulin dose the morning of the procedure and notify the staff that you have done so. Preventing infections: Shower with an antibacterial soap the morning of your procedure. Build-up your immune system: Take 1000 mg of Vitamin C with every meal (3 times a day) the day prior to your procedure. Antibiotics: Inform the staff if you have a condition or reason that requires you to take antibiotics before dental procedures. Pregnancy: If you are pregnant, call and cancel the procedure. Sickness: If  you have a cold, fever, or any active infections, call and cancel the procedure. Arrival: You must be in the facility at least 30 minutes prior to your scheduled procedure. Children: Do not bring children with you. Dress appropriately: Bring dark clothing that you would not mind if they get stained. Valuables: Do not bring any jewelry or valuables.  Reasons to call and reschedule or cancel your procedure: (Following these recommendations will minimize the risk of a serious complication.) Surgeries: Avoid having procedures within 2 weeks of any surgery. (Avoid for 2 weeks before or after any surgery). Flu Shots: Avoid having procedures within 2 weeks of a flu shots. (Avoid for 2 weeks before or after immunizations). Barium: Avoid having a procedure within 7-10 days after having had a radiological study involving the use of radiological contrast. (Myelograms, Barium swallow or enema study). Heart attacks: Avoid any elective procedures or surgeries for the initial 6 months after a "Myocardial Infarction" (Heart Attack). Blood thinners: It is imperative that you stop these medications before procedures. Let us know if you if you take any blood thinner.  Infection: Avoid procedures during or within two weeks of an infection (including chest colds or gastrointestinal problems). Symptoms associated with infections include: Localized redness, fever, chills, night sweats or profuse sweating, burning sensation when voiding, cough, congestion, stuffiness, runny nose, sore throat, diarrhea, nausea, vomiting, cold or Flu symptoms, recent or current infections. It is specially important if the infection is over the area that we intend to treat. Heart and lung problems: Symptoms that may suggest an active cardiopulmonary problem include: cough, chest pain, breathing difficulties or shortness of breath, dizziness, ankle swelling, uncontrolled high or unusually low blood pressure, and/or palpitations. If you are    experiencing any of these symptoms, cancel your procedure and contact your primary care physician for an evaluation.  Remember:  Regular Business hours are:  Monday to Thursday 8:00 AM to 4:00 PM  Provider's Schedule: Zaxton Angerer, MD:  Procedure days: Tuesday and Thursday 7:30 AM to 4:00 PM  Bilal Lateef, MD:  Procedure days: Monday and Wednesday 7:30 AM to 4:00 PM ______________________________________________________________________  ____________________________________________________________________________________________  General Risks and Possible Complications  Patient Responsibilities: It is important that you read this as it is part of your informed consent. It is our duty to inform you of the risks and possible complications associated with treatments offered to you. It is your responsibility as a patient to read this and to ask questions about anything that is not clear or that you believe was not covered in this document.  Patient's Rights: You have the right to refuse treatment. You also have the right to change your mind, even after initially having agreed to have the treatment done. However, under this last option, if you wait until the last second to change your mind, you may be charged for the materials used up to that point.  Introduction: Medicine is not an exact science. Everything in Medicine, including the lack of treatment(s), carries the potential for danger, harm, or loss (which is by definition: Risk). In Medicine, a complication is a secondary problem, condition, or disease that can aggravate an already existing one. All treatments carry the risk of possible complications. The fact that a side effects or complications occurs, does not imply that the treatment was conducted incorrectly. It must be clearly understood that these can happen even when everything is done following the highest safety standards.  No treatment: You can choose not to proceed with the  proposed treatment alternative. The "PRO(s)" would include: avoiding the risk of complications associated with the therapy. The "CON(s)" would include: not getting any of the treatment benefits. These benefits fall under one of three categories: diagnostic; therapeutic; and/or palliative. Diagnostic benefits include: getting information which can ultimately lead to improvement of the disease or symptom(s). Therapeutic benefits are those associated with the successful treatment of the disease. Finally, palliative benefits are those related to the decrease of the primary symptoms, without necessarily curing the condition (example: decreasing the pain from a flare-up of a chronic condition, such as incurable terminal cancer).  General Risks and Complications: These are associated to most interventional treatments. They can occur alone, or in combination. They fall under one of the following six (6) categories: no benefit or worsening of symptoms; bleeding; infection; nerve damage; allergic reactions; and/or death. No benefits or worsening of symptoms: In Medicine there are no guarantees, only probabilities. No healthcare provider can ever guarantee that a medical treatment will work, they can only state the probability that it may. Furthermore, there is always the possibility that the condition may worsen, either directly, or indirectly, as a consequence of the treatment. Bleeding: This is more common if the patient is taking a blood thinner, either prescription or over the counter (example: Goody Powders, Fish oil, Aspirin, Garlic, etc.), or if suffering a condition associated with impaired coagulation (example: Hemophilia, cirrhosis of the liver, low platelet counts, etc.). However, even if you do not have one on these, it can still happen. If you have any of these conditions, or take one of these drugs, make sure to notify your treating physician. Infection: This is more common in patients with a compromised  immune system, either due to disease (example:   diabetes, cancer, human immunodeficiency virus [HIV], etc.), or due to medications or treatments (example: therapies used to treat cancer and rheumatological diseases). However, even if you do not have one on these, it can still happen. If you have any of these conditions, or take one of these drugs, make sure to notify your treating physician. Nerve Damage: This is more common when the treatment is an invasive one, but it can also happen with the use of medications, such as those used in the treatment of cancer. The damage can occur to small secondary nerves, or to large primary ones, such as those in the spinal cord and brain. This damage may be temporary or permanent and it may lead to impairments that can range from temporary numbness to permanent paralysis and/or brain death. Allergic Reactions: Any time a substance or material comes in contact with our body, there is the possibility of an allergic reaction. These can range from a mild skin rash (contact dermatitis) to a severe systemic reaction (anaphylactic reaction), which can result in death. Death: In general, any medical intervention can result in death, most of the time due to an unforeseen complication. ____________________________________________________________________________________________  

## 2021-02-02 ENCOUNTER — Ambulatory Visit: Payer: Medicaid Other | Admitting: Physician Assistant

## 2021-02-02 ENCOUNTER — Encounter: Payer: Self-pay | Admitting: Physician Assistant

## 2021-02-02 ENCOUNTER — Other Ambulatory Visit: Payer: Self-pay

## 2021-02-02 VITALS — BP 114/72 | HR 65 | Ht 68.0 in | Wt 202.2 lb

## 2021-02-02 DIAGNOSIS — H7293 Unspecified perforation of tympanic membrane, bilateral: Secondary | ICD-10-CM

## 2021-02-02 DIAGNOSIS — L409 Psoriasis, unspecified: Secondary | ICD-10-CM

## 2021-02-02 MED ORDER — MOMETASONE FUROATE 0.1 % EX CREA: 1.0000 "application " | TOPICAL_CREAM | Freq: Every day | CUTANEOUS | 0 refills | Status: DC

## 2021-02-02 MED ORDER — OFLOXACIN 0.3 % OT SOLN: 10.0000 [drp] | Freq: Two times a day (BID) | OTIC | 0 refills | Status: AC

## 2021-02-02 NOTE — Progress Notes (Signed)
I,Sha'taria Tyson,acting as a Education administrator for [employer], PA-C.,have documented all relevant documentation on the behalf of Caleb Kirschner, PA-C,as directed by  Caleb Kirschner, PA-C while in the presence of Caleb Kirschner, PA-C.  Established patient visit   Patient: Caleb [name]   DOB: April 24, 1955   66 y.o. Male  MRN: [id number] Visit Date: 02/02/2021  Today's healthcare provider: [name], PA-C   Cc. Bilateral ear drainage  Subjective     Cartez is a 66 y/o male w/ a history of severe psoriasis who presents today with bilateral ear drainage for several months. Reports hearing loss in his left ear, and partial hearing loss in his right ear. Left ear is extremely itchy. Denies bloody d/c. Denies ear pain, headaches, fevers, chills.   Reports seeing a derm for psoriasis 3 weeks ago and was rx a biologic agent, but insurance has yet to approve. Has topical clobetasol for some areas. Thinks the itching in his ear is secondary to psoriasis.   Medications: Outpatient Medications Prior to Visit  Medication Sig   albuterol (VENTOLIN HFA) 108 (90 Base) MCG/ACT inhaler Inhale 2 puffs into the lungs every 6 (six) hours as needed for wheezing or shortness of breath.   ANORO ELLIPTA 62.5-25 MCG/ACT AEPB INHALE 1 PUFF BY MOUTH EVERY DAY   aspirin EC 81 MG tablet Take 81 mg by mouth daily.    cetirizine (ZYRTEC) 10 MG tablet TAKE 1 TABLET BY MOUTH EVERY DAY   clobetasol (TEMOVATE) 0.05 % external solution Apply 1 application topically See admin instructions. Mix with cerave jar cream use as directed in handout   Clobetasol Prop Emollient Base (CLOBETASOL PROPIONATE E) 0.05 % emollient cream Apply 1 application topically 2 (two) times daily. Apply to affected areas for psoriasis. Avoid applying to face, groin, and axilla. Use as directed.   fluocinolone (SYNALAR) 0.01 % external solution Apply topically 2 (two) times daily. To ears for psoriasis (Patient taking differently: Apply 1  application topically 2 (two) times daily as needed (psoriasis in ears).)   fluticasone (FLONASE) 50 MCG/ACT nasal spray Place 2 sprays into both nostrils daily.   hydrochlorothiazide (HYDRODIURIL) 25 MG tablet TAKE 1 TABLET (25 MG TOTAL) BY MOUTH DAILY.   ibuprofen (ADVIL) 800 MG tablet Take 1 tablet (800 mg total) by mouth every 6 (six) hours as needed.   isosorbide mononitrate (IMDUR) 30 MG 24 hr tablet TAKE 1/2 OF A TABLET (15 MG TOTAL) BY MOUTH DAILY   ketorolac (TORADOL) 10 MG tablet Take 1 tablet (10 mg total) by mouth every 6 (six) hours as needed.   lidocaine (XYLOCAINE) 5 % ointment Apply 1 application topically as needed.   losartan (COZAAR) 100 MG tablet TAKE 1 TABLET BY MOUTH EVERY DAY   Melatonin 10 MG CAPS Take 20 mg by mouth at bedtime.   methocarbamol (ROBAXIN) 750 MG tablet TAKE 1 TABLET (750 MG TOTAL) BY MOUTH EVERY 8 (EIGHT) HOURS AS NEEDED FOR MUSCLE SPASMS.   montelukast (SINGULAIR) 10 MG tablet TAKE 1 TABLET BY MOUTH EVERYDAY AT BEDTIME   naloxone (NARCAN) nasal spray 4 mg/0.1 mL Use in case of opioid overdose   nitroGLYCERIN (NITROSTAT) 0.4 MG SL tablet Place 1 tablet (0.4 mg total) under the tongue every 5 (five) minutes as needed for chest pain.   pregabalin (LYRICA) 150 MG capsule TAKE 1 CAPSULE BY MOUTH 3 TIMES DAILY.   rosuvastatin (CRESTOR) 40 MG tablet TAKE 1 TABLET BY MOUTH EVERY DAY   Secukinumab, 300 MG Dose, (COSENTYX SENSOREADY,  300 MG,) 150 MG/ML SOAJ Inject 300 mg into the skin as directed. On week 0, 1, 2, 3 and 4.   Secukinumab, 300 MG Dose, (COSENTYX SENSOREADY, 300 MG,) 150 MG/ML SOAJ Inject 300 mg into the skin every 28 (twenty-eight) days. For maintenance.   tadalafil (CIALIS) 5 MG tablet Take 1-4 tablets by mouth as needed   triamcinolone ointment (KENALOG) 0.5 % Apply 1 application topically 2 (two) times daily.   TURMERIC PO Take 1 capsule by mouth daily.   varenicline (CHANTIX) 1 MG tablet Take 1 tablet (1 mg total) by mouth 2 (two) times daily.    [DISCONTINUED] HYDROcodone-acetaminophen (NORCO) 7.5-325 MG tablet Take 1 tablet by mouth every 6 (six) hours as needed for severe pain. Must last 30 days.   [DISCONTINUED] mometasone (ELOCON) 0.1 % lotion APPLY 1 ML ONTO THE SKIN DAILY FOR PSORIASIS   [DISCONTINUED] HYDROcodone-acetaminophen (NORCO) 7.5-325 MG tablet Take 1 tablet by mouth every 6 (six) hours as needed for severe pain. Must last 30 days.   [DISCONTINUED] HYDROcodone-acetaminophen (NORCO) 7.5-325 MG tablet Take 1 tablet by mouth every 6 (six) hours as needed for severe pain. Must last 30 days. (Patient not taking: Reported on 01/19/2021)   No facility-administered medications prior to visit.    Review of Systems  HENT:  Positive for ear discharge.   All other systems reviewed and are negative.     Objective    Blood pressure 114/72, pulse 65, height 5\' 8"  (1.727 m), weight 202 lb 3.2 oz (91.7 kg), SpO2 98 %.   Physical Exam Constitutional:      General: He is awake.     Appearance: He is well-developed.  HENT:     Head: Normocephalic.     Right Ear: Hearing normal. Drainage present. Tympanic membrane is perforated.     Left Ear: No decreased hearing noted. Tympanic membrane is perforated.     Ears:     Comments: Difficult to full visualize secondary to cerumen, but right ear does appear grossly perforated, as was very dark and unable to visualize any landmarks. Left ear w/ small perforation more superior TM, but TM appears erythematous , no landmarks visualized. Eyes:     Conjunctiva/sclera: Conjunctivae normal.  Cardiovascular:     Rate and Rhythm: Normal rate and regular rhythm.     Heart sounds: Normal heart sounds.  Pulmonary:     Effort: Pulmonary effort is normal.     Breath sounds: Normal breath sounds.  Skin:    General: Skin is warm.  Neurological:     Mental Status: He is alert and oriented to person, place, and time.  Psychiatric:        Attention and Perception: Attention normal.        Mood and  Affect: Mood normal.        Speech: Speech normal.        Behavior: Behavior is cooperative.     No results found for any visits on 02/02/21.  Assessment & Plan     Perforated TM, bilateral Rx floxin ear drops to use 10 drops in both ears twice a day Rx topical steroid to use on external left ear  2. Psoriasis Encouraged he f/u with derm Outer left ear w/ flare  Return if symptoms worsen or fail to improve.      I, Caleb Kirschner, PA-C have reviewed all documentation for this visit. The documentation on 02/02/2021 for the exam, diagnosis, procedures, and orders are all accurate and complete.  Caleb Kirschner, PA-C  [facility] [phone] (phone) [phone] (fax)  [place]

## 2021-02-05 ENCOUNTER — Ambulatory Visit (INDEPENDENT_AMBULATORY_CARE_PROVIDER_SITE_OTHER): Payer: Medicaid Other

## 2021-02-05 ENCOUNTER — Other Ambulatory Visit: Payer: Self-pay

## 2021-02-05 DIAGNOSIS — L400 Psoriasis vulgaris: Secondary | ICD-10-CM | POA: Diagnosis not present

## 2021-02-05 NOTE — Progress Notes (Signed)
Total Surface Area: 96cm2 Total Energy: 38.40J

## 2021-02-09 ENCOUNTER — Ambulatory Visit (INDEPENDENT_AMBULATORY_CARE_PROVIDER_SITE_OTHER): Payer: Medicaid Other

## 2021-02-09 ENCOUNTER — Ambulatory Visit: Payer: Medicaid Other | Admitting: Dermatology

## 2021-02-09 ENCOUNTER — Other Ambulatory Visit: Payer: Self-pay

## 2021-02-09 DIAGNOSIS — L400 Psoriasis vulgaris: Secondary | ICD-10-CM

## 2021-02-09 DIAGNOSIS — L409 Psoriasis, unspecified: Secondary | ICD-10-CM

## 2021-02-09 MED ORDER — CLOBETASOL PROPIONATE 0.05 % EX CREA: 1.0000 "application " | TOPICAL_CREAM | Freq: Two times a day (BID) | CUTANEOUS | 1 refills | Status: DC

## 2021-02-09 NOTE — Progress Notes (Signed)
Total Surface Area: 104cm2 Total Energy: 47.84J

## 2021-02-09 NOTE — Patient Instructions (Addendum)
Psoriasis is a chronic non-curable, but treatable genetic/hereditary disease that may have other systemic features affecting other organ systems such as joints (Psoriatic Arthritis). It is associated with an increased risk of inflammatory bowel disease, heart disease, non-alcoholic fatty liver disease, and depression.    Continue clobetasol/CeraVe mix 1-2 times daily. Avoid applying to face, groin, and axilla. Use as directed. Long-term use can cause thinning of the skin. Start clobetasol cream to more severe areas 1-2 times daily. Avoid applying to face, groin, and axilla. Use as directed. Long-term use can cause thinning of the skin.   Reviewed risks of biologics including immunosuppression, infections, injection site reaction, and failure to improve condition. Goal is control of skin condition, not cure.  Some older biologics such as Humira and Enbrel may slightly increase risk of malignancy and may worsen congestive heart failure. The use of biologics requires long term medication management, including periodic office visits and monitoring of blood work.   Topical steroids (such as triamcinolone, fluocinolone, fluocinonide, mometasone, clobetasol, halobetasol, betamethasone, hydrocortisone) can cause thinning and lightening of the skin if they are used for too long in the same area. Your physician has selected the right strength medicine for your problem and area affected on the body. Please use your medication only as directed by your physician to prevent side effects.    If You Need Anything After Your Visit  If you have any questions or concerns for your doctor, please call our main line at [phone] and press option 4 to reach your doctor's medical assistant. If no one answers, please leave a voicemail as directed and we will return your call as soon as possible. Messages left after 4 pm will be answered the following business day.   You may also send [place] a message via [place]. We typically  respond to MyChart messages within 1-2 business days.  For prescription refills, please ask your pharmacy to contact our office. Our fax number is [phone].  If you have an urgent issue when the clinic is closed that cannot wait until the next business day, you can page your doctor at the number below.    Please note that while we do our best to be available for urgent issues outside of office hours, we are not available 24/7.   If you have an urgent issue and are unable to reach [place], you may choose to seek medical care at your doctor's office, retail clinic, urgent care center, or emergency room.  If you have a medical emergency, please immediately call 911 or go to the emergency department.  Pager Numbers  - Dr. Nehemiah Massed: [phone]  - Dr. Laurence Ferrari: [phone]  - Dr. Nicole Kindred: [phone]  In the event of inclement weather, please call our main line at [phone] for an update on the status of any delays or closures.  Dermatology Medication Tips: Please keep the boxes that topical medications come in in order to help keep track of the instructions about where and how to use these. Pharmacies typically print the medication instructions only on the boxes and not directly on the medication tubes.   If your medication is too expensive, please contact our office at [phone] option 4 or send [place] a message through [place].   We are unable to tell what your co-pay for medications will be in advance as this is different depending on your insurance coverage. However, we may be able to find a substitute medication at lower cost or fill out paperwork to get insurance to cover a  needed medication.   If a prior authorization is required to get your medication covered by your insurance company, please allow [place] 1-2 business days to complete this process.  Drug prices often vary depending on where the prescription is filled and some pharmacies may offer cheaper prices.  The website  www.goodrx.com contains coupons for medications through different pharmacies. The prices here do not account for what the cost may be with help from insurance (it may be cheaper with your insurance), but the website can give you the price if you did not use any insurance.  - You can print the associated coupon and take it with your prescription to the pharmacy.  - You may also stop by our office during regular business hours and pick up a GoodRx coupon card.  - If you need your prescription sent electronically to a different pharmacy, notify our office through [facility] or by phone at [phone] option 4.     Si Usted Necesita Algo Despus de Su Visita  Tambin puede enviarnos un mensaje a travs de Pharmacist, community. Por lo general respondemos a los mensajes de MyChart en el transcurso de 1 a 2 das hbiles.  Para renovar recetas, por favor pida a su farmacia que se ponga en contacto con nuestra oficina. Harland Dingwall de fax es [place] [phone].  Si tiene un asunto urgente cuando la clnica est cerrada y que no puede esperar hasta el siguiente da hbil, puede llamar/localizar a su doctor(a) al nmero que aparece a continuacin.   Por favor, tenga en cuenta que aunque hacemos todo lo posible para estar disponibles para asuntos urgentes fuera del horario de [place], no estamos disponibles las 24 horas del da, los 7 das de la [place].   Si tiene un problema urgente y no puede comunicarse con nosotros, puede optar por buscar atencin mdica  en el consultorio de su doctor(a), en una clnica privada, en un centro de atencin urgente o en una sala de emergencias.  Si tiene Engineering geologist, por favor llame inmediatamente al 911 o vaya a la sala de emergencias.  Nmeros de bper  - Dr. Nehemiah Massed: [phone]  - Dra. Moye: [phone]  - Dra. Nicole Kindred: [phone]  En caso de inclemencias del [place], por favor llame a [name] principal al [phone] para una actualizacin  sobre el [place] de cualquier retraso o cierre.  Consejos para la medicacin en dermatologa: Por favor, guarde las cajas en las que vienen los medicamentos de uso tpico para ayudarle a seguir las instrucciones sobre dnde y cmo usarlos. Las farmacias generalmente imprimen las instrucciones del medicamento slo en las cajas y no directamente en los tubos del [place].   Si su medicamento es muy caro, por favor, pngase en contacto con Zigmund Daniel llamando al [phone] y presione la opcin 4 o envenos un mensaje a travs de Pharmacist, community.   No podemos decirle cul ser su copago por los medicamentos por adelantado ya que esto es diferente dependiendo de la cobertura de su seguro. Sin embargo, es posible que podamos encontrar un medicamento sustituto a Electrical engineer un formulario para que el seguro cubra el medicamento que se considera necesario.   Si se requiere una autorizacin previa para que su compaa de seguros [place] su medicamento, por favor permtanos de 1 a 2 das hbiles para completar este proceso.  Los precios de los medicamentos varan con frecuencia dependiendo del Environmental consultant de dnde se surte la receta y alguna farmacias pueden ofrecer precios ms baratos.  El sitio web www.goodrx.com tiene cupones para medicamentos de Airline pilot. Los precios aqu no tienen en cuenta lo que podra costar con la ayuda del seguro (puede ser ms barato con su seguro), pero el sitio web puede darle el precio si no utiliz Research scientist (physical sciences).  - Puede imprimir el cupn correspondiente y llevarlo con su receta a la farmacia.  - Tambin puede pasar por nuestra oficina durante el horario de atencin regular y Charity fundraiser una tarjeta de cupones de GoodRx.  - Si necesita que su receta se enve electrnicamente a una farmacia diferente, informe a nuestra oficina a travs de MyChart de [place] o por telfono llamando al [phone] y presione la opcin 4.

## 2021-02-09 NOTE — Progress Notes (Signed)
° °  Follow-Up Visit   Subjective  Caleb [name] is a 66 y.o. male who presents for the following: Psoriasis (Patient here today for psoriasis follow up. Patient did not get clobetasol cream but is using clobetasol solution/CeraVe mix 2-3 times daily with no improvement. Cosentyx was denied, appeal has been started. Patient also started Xtrac laser. ).  His psoriasis is very itchy and keeps him up at night.  He did well on Cosentyx in past.   The following portions of the chart were reviewed this encounter and updated as appropriate:       Review of Systems:  No other skin or systemic complaints except as noted in HPI or Assessment and Plan.  Objective  Well appearing patient in no apparent distress; mood and affect are within normal limits.  A focused examination was performed including legs, abdomen. Relevant physical exam findings are noted in the Assessment and Plan.  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 It is associated with an increased risk of inflammatory bowel disease, heart disease, non-alcoholic fatty liver disease, and depression.    Continue clobetasol/CeraVe mix 1-2 times daily. Avoid applying to face, groin, and axilla. Use as directed. Long-term use can cause thinning of the skin. Start clobetasol cream to more severe areas 1-2 times daily. Avoid applying to face, groin, and axilla. Use as directed. Long-term use can cause thinning of the skin.  Start Cosentyx loading dose. Samples (150 mg x 2) given to patient. He will self inject at home. Will continue weekly loading dose with samples while  insurance appeal in process. Lot # [id number]  Exp: 02/2022    clobetasol cream (TEMOVATE) 0.05 % - legs Apply 1 application topically 2 (two) times daily. Avoid applying to face, groin, and axilla. Use as directed. Long-term use can cause thinning of the skin.  Related Medications clobetasol (TEMOVATE) 0.05 % external solution Apply 1 application topically See admin instructions. Mix with cerave jar cream use as directed in handout   Return in about 1 week (around 02/16/2021) for with nurse for Cosentyx 2nd loading dose, samples. 1 month with Dr. Nicole Kindred.  Graciella Belton, RMA, am acting as scribe for [name], MD .  Documentation: I have reviewed the above documentation for accuracy and completeness, and I agree with the above.  [name] MD

## 2021-02-09 NOTE — Progress Notes (Deleted)
° °  Follow-Up Visit   Subjective  RUE TINNEL is a 66 y.o. male who presents for the following: Psoriasis (Patient here today for psoriasis follow up. Patient did not get clobetasol cream but is using clobetasol solution/Cera mix 2-3 times daily with no improvement. Cosentyx was denied, appeal has been started. Patient also started Xtrac laser. ).  ***  The following portions of the chart were reviewed this encounter and updated as appropriate:       Review of Systems:  No other skin or systemic complaints except as noted in HPI or Assessment and Plan.  Objective  Well appearing patient in no apparent distress; mood and affect are within normal limits.  {[id number]::"Z full examination was performed including scalp, head, eyes, ears, nose, lips, neck, chest, axillae, abdomen, back, buttocks, bilateral upper extremities, bilateral lower extremities, hands, feet, fingers, toes, fingernails, and toenails. All findings within normal limits unless otherwise noted below."}    Assessment & Plan   No follow-ups on file.

## 2021-02-10 ENCOUNTER — Encounter: Payer: Self-pay | Admitting: Pain Medicine

## 2021-02-10 ENCOUNTER — Ambulatory Visit (HOSPITAL_BASED_OUTPATIENT_CLINIC_OR_DEPARTMENT_OTHER): Payer: Medicaid Other | Admitting: Pain Medicine

## 2021-02-10 ENCOUNTER — Ambulatory Visit
Admission: RE | Admit: 2021-02-10 | Discharge: 2021-02-10 | Disposition: A | Discharge status: Home / Self Care | Payer: Medicaid Other | Source: Ambulatory Visit | Attending: Pain Medicine | Admitting: Pain Medicine

## 2021-02-10 ENCOUNTER — Other Ambulatory Visit: Payer: Self-pay

## 2021-02-10 VITALS — BP 118/73 | HR 78 | Temp 97.4°F | Resp 16 | Ht 68.0 in | Wt 198.0 lb

## 2021-02-10 DIAGNOSIS — M4722 Other spondylosis with radiculopathy, cervical region: Secondary | ICD-10-CM | POA: Diagnosis not present

## 2021-02-10 DIAGNOSIS — M79602 Pain in left arm: Secondary | ICD-10-CM | POA: Insufficient documentation

## 2021-02-10 DIAGNOSIS — M542 Cervicalgia: Secondary | ICD-10-CM | POA: Diagnosis not present

## 2021-02-10 DIAGNOSIS — M5412 Radiculopathy, cervical region: Secondary | ICD-10-CM | POA: Diagnosis not present

## 2021-02-10 DIAGNOSIS — M5010 Cervical disc disorder with radiculopathy, unspecified cervical region: Secondary | ICD-10-CM | POA: Insufficient documentation

## 2021-02-10 DIAGNOSIS — M5030 Other cervical disc degeneration, unspecified cervical region: Secondary | ICD-10-CM | POA: Insufficient documentation

## 2021-02-10 DIAGNOSIS — R29898 Other symptoms and signs involving the musculoskeletal system: Secondary | ICD-10-CM | POA: Insufficient documentation

## 2021-02-10 DIAGNOSIS — M62838 Other muscle spasm: Secondary | ICD-10-CM | POA: Diagnosis not present

## 2021-02-10 DIAGNOSIS — R937 Abnormal findings on diagnostic imaging of other parts of musculoskeletal system: Secondary | ICD-10-CM | POA: Insufficient documentation

## 2021-02-10 DIAGNOSIS — G8929 Other chronic pain: Secondary | ICD-10-CM | POA: Diagnosis not present

## 2021-02-10 DIAGNOSIS — M4802 Spinal stenosis, cervical region: Secondary | ICD-10-CM | POA: Insufficient documentation

## 2021-02-10 DIAGNOSIS — R94130 Abnormal response to nerve stimulation, unspecified: Secondary | ICD-10-CM | POA: Diagnosis not present

## 2021-02-10 MED ORDER — LACTATED RINGERS IV SOLN
1000.0000 mL | Freq: Once | INTRAVENOUS | Status: AC
  Administered 2021-02-10: 1000 mL via INTRAVENOUS

## 2021-02-10 MED ORDER — PENTAFLUOROPROP-TETRAFLUOROETH EX AERO
INHALATION_SPRAY | Freq: Once | CUTANEOUS | Status: DC
  Filled 2021-02-10: qty 116

## 2021-02-10 MED ORDER — MIDAZOLAM HCL 5 MG/5ML IJ SOLN
INTRAMUSCULAR | Status: AC
  Filled 2021-02-10: qty 5

## 2021-02-10 MED ORDER — ROPIVACAINE HCL 2 MG/ML IJ SOLN
1.0000 mL | Freq: Once | INTRAMUSCULAR | Status: AC
  Administered 2021-02-10: 1 mL via EPIDURAL

## 2021-02-10 MED ORDER — ROPIVACAINE HCL 2 MG/ML IJ SOLN
INTRAMUSCULAR | Status: AC
  Filled 2021-02-10: qty 20

## 2021-02-10 MED ORDER — DEXAMETHASONE SODIUM PHOSPHATE 10 MG/ML IJ SOLN
10.0000 mg | Freq: Once | INTRAMUSCULAR | Status: AC
  Administered 2021-02-10: 10 mg

## 2021-02-10 MED ORDER — TRIAMCINOLONE ACETONIDE 40 MG/ML IJ SUSP
INTRAMUSCULAR | Status: AC
  Filled 2021-02-10: qty 1

## 2021-02-10 MED ORDER — MIDAZOLAM HCL 5 MG/5ML IJ SOLN
0.5000 mg | Freq: Once | INTRAMUSCULAR | Status: AC
  Administered 2021-02-10: 2 mg via INTRAVENOUS

## 2021-02-10 MED ORDER — LIDOCAINE HCL 2 % IJ SOLN
20.0000 mL | Freq: Once | INTRAMUSCULAR | Status: AC
  Administered 2021-02-10: 400 mg

## 2021-02-10 MED ORDER — IOHEXOL 180 MG/ML  SOLN
10.0000 mL | Freq: Once | INTRAMUSCULAR | Status: AC
  Administered 2021-02-10: 10 mL via EPIDURAL

## 2021-02-10 MED ORDER — LIDOCAINE HCL 2 % IJ SOLN
INTRAMUSCULAR | Status: AC
  Filled 2021-02-10: qty 20

## 2021-02-10 MED ORDER — SODIUM CHLORIDE (PF) 0.9 % IJ SOLN
INTRAMUSCULAR | Status: AC
  Filled 2021-02-10: qty 10

## 2021-02-10 MED ORDER — SODIUM CHLORIDE 0.9% FLUSH
1.0000 mL | Freq: Once | INTRAVENOUS | Status: AC
  Administered 2021-02-10: 1 mL

## 2021-02-10 NOTE — Progress Notes (Signed)
Safety precautions to be maintained throughout the outpatient stay will include: orient to surroundings, keep bed in low position, maintain call bell within reach at all times, provide assistance with transfer out of bed and ambulation.  

## 2021-02-10 NOTE — Progress Notes (Signed)
PROVIDER NOTE: Interpretation of information contained herein should be left to medically-trained personnel. Specific patient instructions are provided elsewhere under "Patient Instructions" section of medical record. This document was created in part using STT-dictation technology, any transcriptional errors that may result from this process are unintentional.  Patient: Caleb [name] Type: Established DOB: 1955/02/14 MRN: [id number] PCP: Virginia Crews, MD  Service: Procedure DOS: 02/10/2021 Setting: Ambulatory Location: Ambulatory outpatient facility Delivery: Face-to-face Provider: Gaspar Cola, MD Specialty: Interventional Pain Management Specialty designation: 09 Location: Outpatient facility Ref. Prov.: Milinda Pointer, MD   Procedure [facility] Interventional Pain Management )   Type: Cervical Epidural Steroid injection (ESI) (Interlaminar) #3  Laterality: Midline  Level: C7-T1 Imaging: Fluoroscopy-assisted DOS: 02/10/2021  Performed by: Milinda Pointer, MD Anesthesia: Local anesthesia (1-2% Lidocaine) Anxiolysis: IV Versed         Sedation: None.   Purpose: Diagnostic/Therapeutic Indications: Cervicalgia, cervical radicular pain, degenerative disc disease, severe enough to impact quality of life or function. 1. Cervical disc disorder with radiculopathy of cervical region   2. 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 Cervical paraspinal muscle spasm   5. Cervical spondylosis w/ radiculopathy   6. Cervicalgia   7. DDD (degenerative disc disease), cervical   8. Abnormal MRI, cervical spine (10/02/2019 & 08/13/2017)    NAS-11 score:   Pre-procedure: 5 /10   Post-procedure: 6  ("It hurt worse than ever today" not numb yet)/10    Note: The patient indicates today to be having a lot of burning pain down into both shoulder blades.  In terms of the upper extremity pain he refers  having some pain going down to his left elbow, but none going into his hands.  During today's injection, it was evident that the patient is having quite a bit of decreased space within the epidural canal.  He was able to tolerate only 0.25 to 0.5 cc increments before experiencing pressure, most of which he indicated felt it on the right side and right arm.  This was compatible with the contrast as it was being seen to spread primarily in the right posterior epidural space.  The patient indicates that the last neurosurgeon that he saw for his neck was Dr. Cari Caraway.  The patient is trying to avoid surgery, but I have explained to him that his condition continues to worsen and he may need some decompression.  I have referred him to Dr. Cari Caraway several times and I have also refer him to Dr. Kristeen Miss.  The patient appears to have last been seen by Dr. Cari Caraway on 12/21/2019.  At that time, Dr. Cari Caraway did not rule out surgery.  He requested the patient to quit smoking and to contact him again once his right foot infection had healed and he was ready for surgery.  Apparently, the patient dropped the ball after that visit since there are no other follow-up visits.  Pre-Procedure Preparation  Monitoring: As per clinic protocol. Respiration, ETCO2, SpO2, BP, heart rate and rhythm monitor placed and checked for adequate function  Risk Assessment: Vitals:  [id number] body mass index is 30.11 kg/m as calculated from the following:   Height as of this encounter: 5\' 8"  (1.727 m).   Weight as of this encounter: 198 lb (89.8 kg)., Rate:74 , BP:130/76, Resp:16, Temp:(!) 97.4 F (36.3 C), SpO2:100 %  Allergies: He is allergic to lisinopril and oxycodone.  Precautions: None required  Blood-thinner(s): None at this time  Coagulopathies: Reviewed. None identified.   Active Infection(s): Reviewed. None identified. Caleb [name] is afebrile   Location setting: Procedure suite Position: Prone, on modified  reverse trendelenburg to facilitate breathing, with head in head-cradle. Pillows positioned under chest (below chin-level) with cervical spine flexed. Safety Precautions: Patient was assessed for positional comfort and pressure points before starting the procedure. Prepping solution: DuraPrep (Iodine Povacrylex [0.7% available iodine] and Isopropyl Alcohol, 74% w/w) Prep Area: Entire  cervicothoracic region Approach: percutaneous, paramedial Intended target: Posterior cervical epidural space Materials: Tray: Epidural Needle(s): Epidural (Tuohy) Qty: 1 Length: (24mm) 3.5-inch Gauge: 17G   Meds ordered this encounter  Medications   iohexol (OMNIPAQUE) 180 MG/ML injection 10 mL    Must be Myelogram-compatible. If not available, you may substitute with a water-soluble, non-ionic, hypoallergenic, myelogram-compatible radiological contrast medium.   lidocaine (XYLOCAINE) 2 % (with pres) injection 400 mg   pentafluoroprop-tetrafluoroeth (GEBAUERS) aerosol   lactated ringers infusion 1,000 mL   midazolam (VERSED) 5 MG/5ML injection 0.5-2 mg    Make sure Flumazenil is available in the pyxis when using this medication. If oversedation occurs, administer 0.2 mg IV over 15 sec. If after 45 sec no response, administer 0.2 mg again over 1 min; may repeat at 1 min intervals; not to exceed 4 doses (1 mg)   sodium chloride flush (NS) 0.9 % injection 1 mL   ropivacaine (PF) 2 mg/mL (0.2%) (NAROPIN) injection 1 mL   dexamethasone (DECADRON) injection 10 mg    Orders Placed This Encounter  Procedures   Cervical Epidural Injection    Indication(s): Radiculitis and cervicalgia associated with cervical degenerative disc disease. Position: Prone Imaging guidance: Fluoroscopy required. Contrast required unless contraindicated by allergy or severe CKD. Equipment & Materials: Epidural tray & needle.    Scheduling Instructions:     Procedure: Cervical Epidural Steroid Injection/Block     Planned Level(s):  C7-T1     Laterality: Left-sided     Anxiolysis: Patient's choice.     Timeframe: Today    Order Specific Question:   Where will this procedure be performed?    Answer:   [facility]    Comments:   by Dr. Fortunato Curling [facility] C-ARM 1-60 MIN NO REPORT    Intraoperative interpretation by procedural physician at [facility].    Standing Status:   Standing    Number of Occurrences:   1    Order Specific Question:   Reason for exam:    Answer:   Assistance in needle guidance and placement for procedures requiring needle placement in or near specific anatomical locations not easily accessible without such assistance.   Informed Consent Details: Physician/Practitioner Attestation; Transcribe to consent form and obtain patient signature    Nursing instructions: Transcribe to consent form and obtain patient signature. Always confirm laterality of pain with Caleb [name], before procedure.    Order Specific Question:   Physician/Practitioner attestation of informed consent for procedure/surgical case    Answer:   I, the physician/practitioner, attest that I have discussed with the patient the benefits, risks, side effects, alternatives, likelihood of achieving goals and potential problems during recovery for the procedure that I have provided informed consent.    Order Specific Question:   Procedure    Answer:   Cervical Epidural Steroid Injection (CESI) under fluoroscopic guidance    Order Specific Question:   Physician/Practitioner performing the procedure    Answer:   Alishia Lebo A. Dossie Arbour MD    Order Specific Question:  Indication/Reason    Answer:   Indications: Cervicalgia (neck pain), cervical radicular pain, radiculitis (arm/shoulder pain, numbness, and/or weakness), degenerative disc disease, severe enough to greatly impact quality of life or function.   Provide equipment / supplies at bedside    "Epidural Tray" (Disposable   single use) Catheter: NOT required     Standing Status:   Standing    Number of Occurrences:   1    Order Specific Question:   Specify    Answer:   Epidural Tray     Time-out: 2841 I initiated and conducted the "Time-out" before starting the procedure, as per protocol. The patient was asked to participate by confirming the accuracy of the "Time Out" information. Verification of the correct person, site, and procedure were performed and confirmed by me, the nursing staff, and the patient. "Time-out" conducted as per Joint Commission's Universal Protocol (UP.01.01.01). Procedure checklist: Completed   H&P (Pre-op  Assessment)  Caleb [name] is a 66 y.o. (year old), male patient, seen today for interventional treatment. He  has a past surgical history that includes Lumbar laminectomy (1989, 1999); Cervical fusion (1988, 1998); Liver surgery (2015); Partial colectomy (1990); Inguinal hernia repair (Bilateral, 1975); Lithotripsy; Colonoscopy with propofol (N/A, 02/24/2017); Cardiac catheterization (2015); Arthrodesis metatarsal (Right, 11/26/2019); and Lesion removal (Right, 11/26/2019). Caleb [name] has a current medication list which includes the following prescription(s): albuterol, anoro ellipta, aspirin ec, cetirizine, clobetasol, clobetasol cream, fluocinolone, fluticasone, hydrochlorothiazide, ibuprofen, isosorbide mononitrate, ketorolac, lidocaine, losartan, melatonin, methocarbamol, montelukast, naloxone, nitroglycerin, ofloxacin, pregabalin, rosuvastatin, cosentyx sensoready (300 mg), cosentyx sensoready (300 mg), tadalafil, triamcinolone ointment, turmeric, and varenicline, and the following Facility-Administered Medications: pentafluoroprop-tetrafluoroeth. His primarily concern today is the Neck Pain (Bilateral )  He is allergic to lisinopril and oxycodone.   Last encounter: My last encounter with him was on 01/29/2021. 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); Cervicalgia; DDD (degenerative disc disease), thoracic; Disorder of superficial peroneal nerve (Right); Chronic upper extremity pain (Left); Cervical spondylosis w/ radiculopathy; Cervical disc disorder with radiculopathy of cervical region; Chronic upper extremity weakness (Left); Abnormal nerve conduction studies (06/08/2017); 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); Spondylosis without myelopathy or radiculopathy, cervical region; Cervical facet syndrome (Bilateral); Abnormal MRI, cervical spine (10/02/2019 & 08/13/2017); History of fusion of cervical spine; Chronic neck pain (4th area of Pain) (Bilateral) (L>R); Chronic neck pain with history of cervical spinal surgery; LLQ abdominal pain; Neuropathic pain; Nondisplaced fracture of proximal phalanx of left thumb with routine healing; 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, sequelae (Right); Cervical paraspinal muscle spasm; Trigger point of neck; Chronic hip pain (Bilateral); Sacroiliac joint pain (Left); Sacroiliac joint dysfunction (Left); Somatic dysfunction of sacroiliac joint (Left); Chronic low back pain (Bilateral) w/o sciatica; DDD (degenerative disc disease), lumbosacral; Failed back surgical syndrome; Neuropathy of right foot; Lumbar facet syndrome (Bilateral); Chronic sacroiliac joint pain (Bilateral); Lumbosacral facet arthropathy (L4-5, L5-S1); Spondylosis without myelopathy or radiculopathy, lumbosacral region; and Lateral epicondylitis of right elbow on their pertinent problem list. Pain Assessment: Severity of Chronic pain is reported as a 5 /10. Location: Neck Left, Right/into shoulder blades. Onset: More than a month ago.  Quality: Discomfort, Constant, Burning. Timing: Constant. Modifying factor(s): nothing currently. Vitals:  height is 5\' 8"  (1.727  m) and weight is 198 lb (89.8 kg). His temporal temperature is 97.4 F (36.3 C) (abnormal). His blood pressure is 118/73 and his pulse is 78. His respiration is 16 and oxygen saturation is 99%.   Reason for encounter: Interventional pain management therapy due pain of at least four (4) weeks in duration, with to failure to respond to and/or inability to tolerate more conservative care.   Site Confirmation: Caleb [name] was asked to confirm the procedure and laterality before marking the site.  Consent: Before the procedure and under the influence of no sedative(s), amnesic(s), or anxiolytics, the patient was informed of the treatment options, risks and possible complications. To fulfill our ethical and legal obligations, as recommended by the American Medical Association's Code of Ethics, I have informed the patient of my clinical impression; the nature and purpose of the treatment or procedure; the risks, benefits, and possible complications of the intervention; the alternatives, including doing nothing; the risk(s) and benefit(s) of the alternative treatment(s) or procedure(s); and the risk(s) and benefit(s) of doing nothing. The patient was provided information about the general risks and possible complications associated with the procedure. These may include, but are not limited to: failure to achieve desired goals, infection, bleeding, organ or nerve damage, allergic reactions, paralysis, and death. In addition, the patient was informed of those risks and complications associated to Spine-related procedures, such as failure to decrease pain; infection (i.e.: Meningitis, epidural or intraspinal abscess); bleeding (i.e.: epidural hematoma, subarachnoid hemorrhage, or any other type of intraspinal or peri-dural bleeding); organ or nerve damage (i.e.: Any type of peripheral  nerve, nerve root, or spinal cord injury) with subsequent damage to sensory, motor, and/or autonomic systems, resulting in permanent pain, numbness, and/or weakness of one or several areas of the body; allergic reactions; (i.e.: anaphylactic reaction); and/or death. Furthermore, the patient was informed of those risks and complications associated with the medications. These include, but are not limited to: allergic reactions (i.e.: anaphylactic or anaphylactoid reaction(s)); adrenal axis suppression; blood sugar elevation that in diabetics may result in ketoacidosis or comma; water retention that in patients with history of congestive heart failure may result in shortness of breath, pulmonary edema, and decompensation with resultant heart failure; weight gain; swelling or edema; medication-induced neural toxicity; particulate matter embolism and blood vessel occlusion with resultant organ, and/or nervous system infarction; and/or aseptic necrosis of one or more joints. Finally, the patient was informed that Medicine is not an exact science; therefore, there is also the possibility of unforeseen or unpredictable risks and/or possible complications that may result in a catastrophic outcome. The patient indicated having understood very clearly. We have given the patient no guarantees and we have made no promises. Enough time was given to the patient to ask questions, all of which were answered to the patient's satisfaction. Caleb [name] has indicated that he wanted to continue with the procedure. Attestation: I, the ordering provider, attest that I have discussed with the patient the benefits, risks, side-effects, alternatives, likelihood of achieving goals, and potential problems during recovery for the procedure that I have provided informed consent.  Date   Time: 02/10/2021 10:55 AM   Prophylactic antibiotics  Anti-infectives (From admission, onward)    None      Indication(s): None identified    Description of procedure   Start Time: 1127 hrs  Local Anesthesia: Once the patient was positioned, prepped, and time-out was completed. The target area was identified located. The skin was marked with an approved surgical skin marker. Once  marked, the skin (epidermis, dermis, and hypodermis), and deeper tissues (fat, connective tissue and muscle) were infiltrated with a small amount of a short-acting local anesthetic, loaded on a 10cc syringe with a 25G, 1.5-in  Needle. An appropriate amount of time was allowed for local anesthetics to take effect before proceeding to the next step. Local Anesthetic: Lidocaine 1-2% The unused portion of the local anesthetic was discarded in the proper designated containers. Safety Precautions: Aspiration looking for blood return was conducted prior to all injections. At no point did I inject any substances, as a needle was being advanced. Before injecting, the patient was told to immediately notify me if he was experiencing any new onset of "ringing in the ears, or metallic taste in the mouth". No attempts were made at seeking any paresthesias. Safe injection practices and needle disposal techniques used. Medications properly checked for expiration dates. SDV (single dose vial) medications used. After the completion of the procedure, all disposable equipment used was discarded in the proper designated medical waste containers.  Technical description: Protocol guidelines were followed. Using fluoroscopic guidance, the epidural needle was introduced through the skin, ipsilateral to the reported pain, and advanced to the target area. Posterior laminar os was contacted and the needle walked caudad, until the lamina was cleared. The ligamentum flavum was engaged and the epidural space identified using loss-of-resistance technique with 2-3 ml of PF-NaCl (0.9% NSS), in a 5cc dedicated LOR syringe. See "Imaging guidance" below for use of contrast details.  Injection: Once  satisfactory needle placement was confirmed, I proceeded to inject the desired solution in slow, incremental fashion, intermittently assessing for discomfort or any signs of abnormal or undesired spread of substance. Once completed, the needle was removed and disposed of, as per hospital protocols.   Vitals:   02/10/21 1124 02/10/21 1129 02/10/21 1134 02/10/21 1139  BP: 96/61 110/74 112/72 118/73  Pulse: 77 79 80 78  Resp: 16 14 15 16   Temp:      TempSrc:      SpO2: 97% 99% 99% 99%  Weight:      Height:        End Time: 1135 hrs  Once the entire procedure was completed, the treated area was cleaned, making sure to leave some of the prepping solution back to take advantage of its long term bactericidal properties.   Imaging guidance  Type of Imaging Technique: Fluoroscopy Guidance (Spinal) Indication(s): Assistance in needle guidance and placement for procedures requiring needle placement in or near specific anatomical locations not easily accessible without such assistance. Exposure Time: Please see nurses notes for exact fluoroscopy time. Contrast: Before injecting any contrast, we confirmed that the patient did not have an allergy to iodine, shellfish, or radiological contrast. Once satisfactory needle placement was completed, radiological contrast was injected under continuous fluoroscopic guidance. Injection of contrast accomplished without complications. See chart for type and volume of contrast used. Fluoroscopic Guidance: I was personally present in the fluoroscopy suite, where the patient was placed in position for the procedure, over the fluoroscopy-compatible table. Fluoroscopy was manipulated, using "Tunnel Vision Technique", to obtain the best possible view of the target area, on the affected side. Parallax error was corrected before commencing the procedure. A "direction-depth-direction" technique was used to introduce the needle under continuous pulsed fluoroscopic guidance. Once  the target was reached, antero-posterior, oblique, and lateral fluoroscopic projection views were taken to confirm needle placement in all planes. Electronic images uploaded into EMR.  Interpretation: Successful epidural injection. Intraoperative imaging interpretation by  performing Physician.  Even though I enter the canal just to the left of midline, most of the contrast was observed to have gone to the right side of the posterior epidural space.  He also experienced quite a bit of pressure as we were slowly injected even though we were injecting between 0.25 and 0.5 cc increments.  This leads me to believe that his cervical stenosis is slowly getting worse.   Post-op assessment  Post-procedure Vital Signs:  Pulse/HCG Rate: 78 (NSR)  Temp: (!) 97.4 F (36.3 C) Resp: 16 BP: 118/73 SpO2: 99 %  EBL: None  Complications: No immediate post-treatment complications observed by team, or reported by patient.  Note: The patient tolerated the entire procedure well. A repeat set of vitals were taken after the procedure and the patient was kept under observation following institutional policy, for this type of procedure. Post-procedural neurological assessment was performed, showing return to baseline, prior to discharge. The patient was provided with post-procedure discharge instructions, including a section on how to identify potential problems. Should any problems arise concerning this procedure, the patient was given instructions to immediately contact us, at any time, without hesitation. In any case, we plan to contact the patient by telephone for a follow-up status report regarding this interventional procedure.  Comments:  No additional relevant information.   Plan of care  Chronic Opioid Analgesic:  Hydrocodone/APAP 5/325 1 tablet every 6 hours (20 mg/day of hydrocodone).  (Unable to tolerate an oxycodone IR trial due to stomachaches, nausea, headaches, and excessive somnolence.  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   Medications administered: We administered iohexol, lidocaine, lactated ringers, midazolam, sodium chloride flush, ropivacaine (PF) 2 mg/mL (0.2%), and dexamethasone.  Follow-up plan:   Return in about 2 weeks (around 02/24/2021) for Proc-day (T,Th), (PPE), (F2F).      Interventional Therapies  Risk   Complexity Considerations:   Estimated body mass index is 31.93 kg/m as calculated from the following:   Height as of this encounter: 5\' 8"  (1.727 m).   Weight as of this encounter: 210 lb (95.3 kg). WNL   Planned   Pending:   Therapeutic left cervical ESI (01/15/2021)   Under consideration:   Possible spinal cord stimulator trial  Possible bilateral cervical facet RFA    Completed:   Therapeutic/palliative left CESI x8 (12/18/2020) (6-0) (100/100/80/75-80)  Diagnostic bilateral cervical facet MBB x2 (09/22/2017) (5-1) (100/100/100/>50)  Diagnostic bilateral lumbar facet MBB x1 (01/29/2020) (3-0) (100/100/90/90)  Diagnostic left SI joint block x1 (06/19/2019) (8-0) (100/100/100 x3 days/0)  Therapeutic midline serratus posterior TPI/MNB x1 (01/11/2019)  Diagnostic right Common Peroneal NB (C-PNB) x2 (04/14/2017) (5-0) (100/100/20/<25)  Therapeutic right common peroneal nerve (C-PN) RFA x1 (06/21/2017) (3-0) (100/100/100 x 3 days/75-100)  Diagnostic right superficial peroneal (S-PN) NB x5 (08/19/2020) (7-3) (100/100/100/90-100) Therapeutic right superficial peroneal nerve (S-PN) RFA x1 (10/25/2017) (4-0) (100/100/100 x1 day/25) Therapeutic right deep peroneal NB (D-PNB) x1 (05/03/2019)    Therapeutic   Palliative (PRN) options:   Palliative left CESI  Diagnostic bilateral cervical facet block #3  Palliative right superficial peroneal NB #5  Palliative right superficial peroneal nerve RFA #2       Recent Visits Date Type Provider Dept  01/29/21 Office Visit Milinda Pointer, MD [facility]  01/15/21  Procedure visit Milinda Pointer, MD [facility]  01/06/21 Office Visit Milinda Pointer, MD [facility]  12/18/20 Procedure visit Milinda Pointer, MD [facility]  11/26/20 Office Visit Milinda Pointer,  MD [facility]  Showing recent visits within past 90 days and meeting all other requirements Today's Visits Date Type Provider Dept  02/10/21 Procedure visit Milinda Pointer, MD [facility]  Showing today's visits and meeting all other requirements Future Appointments Date Type Provider Dept  02/23/21 Appointment Milinda Pointer, MD [facility]  Showing future appointments within next 90 days and meeting all other requirements   Disposition: Discharge home  Discharge (Date   Time): 02/10/2021; 1143 hrs.   Primary Care Physician: Virginia Crews, MD Location: [facility] Note by: Gaspar Cola, MD Date: 02/10/2021; Time: 12:26 PM  DISCLAIMER: Medicine is not an Chief Strategy Officer. It has no guarantees or warranties. The decision to proceed with this intervention was based on the information collected from the patient. Conclusions were drawn from the patient's questionnaire, interview, and examination. Because information was provided in large part by the patient, it cannot be guaranteed that it has not been purposely or unconsciously manipulated or altered. Every effort has been made to obtain as much accurate, relevant, available data as possible. Always take into account that the treatment will also be dependent on availability of resources and existing treatment guidelines, considered by other Pain Management Specialists as being common knowledge and practice, at the time of the intervention. It is also important to point out that variation in procedural techniques and pharmacological choices are the acceptable norm. For Medico-Legal review purposes, the indications, contraindications,  technique, and results of the these procedures should only be evaluated, judged and interpreted by a Board-Certified Interventional Pain Specialist with extensive familiarity and expertise in the same exact procedure and technique.

## 2021-02-10 NOTE — Patient Instructions (Addendum)
____________________________________________________________________________________________ ° °Post-Procedure Discharge Instructions ° °Instructions: °Apply ice:  °Purpose: This will minimize any swelling and discomfort after procedure.  °When: Day of procedure, as soon as you get home. °How: Fill a plastic sandwich bag with crushed ice. Cover it with a small towel and apply to injection site. °How long: (15 min on, 15 min off) Apply for 15 minutes then remove x 15 minutes.  Repeat sequence on day of procedure, until you go to bed. °Apply heat:  °Purpose: To treat any soreness and discomfort from the procedure. °When: Starting the next day after the procedure. °How: Apply heat to procedure site starting the day following the procedure. °How long: May continue to repeat daily, until discomfort goes away. °Food intake: Start with clear liquids (like water) and advance to regular food, as tolerated.  °Physical activities: Keep activities to a minimum for the first 8 hours after the procedure. After that, then as tolerated. °Driving: If you have received any sedation, be responsible and do not drive. You are not allowed to drive for 24 hours after having sedation. °Blood thinner: (Applies only to those taking blood thinners) You may restart your blood thinner 6 hours after your procedure. °Insulin: (Applies only to Diabetic patients taking insulin) As soon as you can eat, you may resume your normal dosing schedule. °Infection prevention: Keep procedure site clean and dry. Shower daily and clean area with soap and water. °Post-procedure Pain Diary: Extremely important that this be done correctly and accurately. Recorded information will be used to determine the next step in treatment. For the purpose of accuracy, follow these rules: °Evaluate only the area treated. Do not report or include pain from an untreated area. For the purpose of this evaluation, ignore all other areas of pain, except for the treated area. °After  your procedure, avoid taking a long nap and attempting to complete the pain diary after you wake up. Instead, set your alarm clock to go off every hour, on the hour, for the initial 8 hours after the procedure. Document the duration of the numbing medicine, and the relief you are getting from it. °Do not go to sleep and attempt to complete it later. It will not be accurate. If you received sedation, it is likely that you were given a medication that may cause amnesia. Because of this, completing the diary at a later time may cause the information to be inaccurate. This information is needed to plan your care. °Follow-up appointment: Keep your post-procedure follow-up evaluation appointment after the procedure (usually 2 weeks for most procedures, 6 weeks for radiofrequencies). DO NOT FORGET to bring you pain diary with you.  ° °Expect: (What should I expect to see with my procedure?) °From numbing medicine (AKA: Local Anesthetics): Numbness or decrease in pain. You may also experience some weakness, which if present, could last for the duration of the local anesthetic. °Onset: Full effect within 15 minutes of injected. °Duration: It will depend on the type of local anesthetic used. On the average, 1 to 8 hours.  °From steroids (Applies only if steroids were used): Decrease in swelling or inflammation. Once inflammation is improved, relief of the pain will follow. °Onset of benefits: Depends on the amount of swelling present. The more swelling, the longer it will take for the benefits to be seen. In some cases, up to 10 days. °Duration: Steroids will stay in the system x 2 weeks. Duration of benefits will depend on multiple posibilities including persistent irritating factors. °Side-effects: If present, they   may typically last 2 weeks (the duration of the steroids). Frequent: Cramps (if they occur, drink Gatorade and take over-the-counter Magnesium 450-500 mg once to twice a day); water retention with temporary  weight gain; increases in blood sugar; decreased immune system response; increased appetite. Occasional: Facial flushing (red, warm cheeks); mood swings; menstrual changes. Uncommon: Long-term decrease or suppression of natural hormones; bone thinning. (These are more common with higher doses or more frequent use. This is why we prefer that our patients avoid having any injection therapies in other practices.)  Very Rare: Severe mood changes; psychosis; aseptic necrosis. From procedure: Some discomfort is to be expected once the numbing medicine wears off. This should be minimal if ice and heat are applied as instructed.  Call if: (When should I call?) You experience numbness and weakness that gets worse with time, as opposed to wearing off. New onset bowel or bladder incontinence. (Applies only to procedures done in the spine)  Emergency Numbers: Durning business hours (Monday - Thursday, 8:00 AM - 4:00 PM) (Friday, 9:00 AM - 12:00 Noon): [phone] After hours: [phone] NOTE: If you are having a problem and are unable connect with, or to talk to a provider, then go to your nearest urgent care or emergency department. If the problem is serious and urgent, please call 911. ____________________________________________________________________________________________  Pain Management Discharge Instructions  General Discharge Instructions :  If you need to reach your doctor call: Monday-Friday 8:00 am - 4:00 pm at [phone] or toll free [phone].  After clinic hours [phone] to have operator reach doctor.  Bring all of your medication bottles to all your appointments in the pain clinic.  To cancel or reschedule your appointment with Pain Management please remember to call 24 hours in advance to avoid a fee.  Refer to the educational materials which you have been given on: General Risks, I had my Procedure. Discharge Instructions, Post Sedation.  Post Procedure  Instructions:  The drugs you were given will stay in your system until tomorrow, so for the next 24 hours you should not drive, make any legal decisions or drink any alcoholic beverages.  You may eat anything you prefer, but it is better to start with liquids then soups and crackers, and gradually work up to solid foods.  Please notify your doctor immediately if you have any unusual bleeding, trouble breathing or pain that is not related to your normal pain.  Depending on the type of procedure that was done, some parts of your body may feel week and/or numb.  This usually clears up by tonight or the next day.  Walk with the use of an assistive device or accompanied by an adult for the 24 hours.  You may use ice on the affected area for the first 24 hours.  Put ice in a Ziploc bag and cover with a towel and place against area 15 minutes on 15 minutes off.  You may switch to heat after 24 hours.Epidural Steroid Injection Patient Information  Description: The epidural space surrounds the nerves as they exit the spinal cord.  In some patients, the nerves can be compressed and inflamed by a bulging disc or a tight spinal canal (spinal stenosis).  By injecting steroids into the epidural space, we can bring irritated nerves into direct contact with a potentially helpful medication.  These steroids act directly on the irritated nerves and can reduce swelling and inflammation which often leads to decreased pain.  Epidural steroids may be injected anywhere along the   spine and from the neck to the low back depending upon the location of your pain.   After numbing the skin with local anesthetic (like Novocaine), a small needle is passed into the epidural space slowly.  You may experience a sensation of pressure while this is being done.  The entire block usually last less than 10 minutes.  Conditions which may be treated by epidural steroids:  Low back and leg pain Neck and arm pain Spinal  stenosis Post-laminectomy syndrome Herpes zoster (shingles) pain Pain from compression fractures  Preparation for the injection:  Do not eat any solid food or dairy products within 8 hours of your appointment.  You may drink clear liquids up to 3 hours before appointment.  Clear liquids include water, black coffee, juice or soda.  No milk or cream please. You may take your regular medication, including pain medications, with a sip of water before your appointment  Diabetics should hold regular insulin (if taken separately) and take 1/2 normal NPH dos the morning of the procedure.  Carry some sugar containing items with you to your appointment. A driver must accompany you and be prepared to drive you home after your procedure.  Bring all your current medications with your. An IV may be inserted and sedation may be given at the discretion of the physician.   A blood pressure cuff, EKG and other monitors will often be applied during the procedure.  Some patients may need to have extra oxygen administered for a short period. You will be asked to provide medical information, including your allergies, prior to the procedure.  We must know immediately if you are taking blood thinners (like Coumadin/Warfarin)  Or if you are allergic to IV iodine contrast (dye). We must know if you could possible be pregnant.  Possible side-effects: Bleeding from needle site Infection (rare, may require surgery) Nerve injury (rare) Numbness & tingling (temporary) Difficulty urinating (rare, temporary) Spinal headache ( a headache worse with upright posture) Light -headedness (temporary) Pain at injection site (several days) Decreased blood pressure (temporary) Weakness in arm/leg (temporary) Pressure sensation in back/neck (temporary)  Call if you experience: Fever/chills associated with headache or increased back/neck pain. Headache worsened by an upright position. New onset weakness or numbness of an  extremity below the injection site Hives or difficulty breathing (go to the emergency room) Inflammation or drainage at the infection site Severe back/neck pain Any new symptoms which are concerning to you  Please note:  Although the local anesthetic injected can often make your back or neck feel good for several hours after the injection, the pain will likely return.  It takes 3-7 days for steroids to work in the epidural space.  You may not notice any pain relief for at least that one week.  If effective, we will often do a series of three injections spaced 3-6 weeks apart to maximally decrease your pain.  After the initial series, we generally will wait several months before considering a repeat injection of the same type.  If you have any questions, please call [phone] [facility] 

## 2021-02-11 ENCOUNTER — Telehealth: Payer: Self-pay | Admitting: *Deleted

## 2021-02-11 NOTE — Telephone Encounter (Signed)
No problems post procedure. 

## 2021-02-12 ENCOUNTER — Other Ambulatory Visit: Payer: Self-pay

## 2021-02-12 ENCOUNTER — Ambulatory Visit (INDEPENDENT_AMBULATORY_CARE_PROVIDER_SITE_OTHER): Payer: Medicaid Other

## 2021-02-12 DIAGNOSIS — L400 Psoriasis vulgaris: Secondary | ICD-10-CM | POA: Diagnosis not present

## 2021-02-12 NOTE — Progress Notes (Signed)
Total Surface Area: 104cm2 Total Energy: 55.02J

## 2021-02-13 DIAGNOSIS — H6123 Impacted cerumen, bilateral: Secondary | ICD-10-CM | POA: Diagnosis not present

## 2021-02-13 DIAGNOSIS — L400 Psoriasis vulgaris: Secondary | ICD-10-CM | POA: Diagnosis not present

## 2021-02-13 DIAGNOSIS — H6063 Unspecified chronic otitis externa, bilateral: Secondary | ICD-10-CM | POA: Diagnosis not present

## 2021-02-16 ENCOUNTER — Ambulatory Visit: Payer: Medicaid Other

## 2021-02-17 ENCOUNTER — Ambulatory Visit: Payer: Medicaid Other

## 2021-02-19 ENCOUNTER — Other Ambulatory Visit: Payer: Self-pay

## 2021-02-19 ENCOUNTER — Ambulatory Visit: Payer: Medicaid Other

## 2021-02-19 ENCOUNTER — Ambulatory Visit (INDEPENDENT_AMBULATORY_CARE_PROVIDER_SITE_OTHER): Payer: Medicaid Other | Admitting: Dermatology

## 2021-02-19 DIAGNOSIS — L400 Psoriasis vulgaris: Secondary | ICD-10-CM | POA: Diagnosis not present

## 2021-02-19 NOTE — Progress Notes (Signed)
Total Surface Area: 84cm2 Total Energy: 51.07J   Documentation: I have reviewed the above documentation for accuracy and completeness, and I agree with the above.  [name] MD

## 2021-02-22 NOTE — Progress Notes (Signed)
PROVIDER NOTE: Information contained herein reflects review and annotations entered in association with encounter. Interpretation of such information and data should be left to medically-trained personnel. Information provided to patient can be located elsewhere in the medical record under "Patient Instructions". Document created using STT-dictation technology, any transcriptional errors that may result from process are unintentional.  °  °Patient: Caleb [name]  Service Category: E/M  Provider: Tavon Corriher A Keziah Drotar, MD  °DOB: 02/11/1955  DOS: 02/23/2021  Specialty: Interventional Pain Management  °MRN: [id number]  Setting: Ambulatory outpatient  PCP: Bacigalupo, Angela M, MD  °Type: Established Patient    Referring Provider: Bacigalupo, Angela M, MD  °Location: Office  Delivery: Face-to-face    ° °HPI  °Caleb [name], a 66 y.o. year old male, is here today because of his Chronic pain syndrome [G89.4]. Mr. Haro's primary complain today is Foot Pain (right) °Last encounter: My last encounter with him was on 02/10/2021. °Pertinent problems: 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); Cervicalgia; DDD (degenerative disc disease), thoracic; Disorder of superficial peroneal nerve (Right); Chronic upper extremity pain (Left); Cervical spondylosis w/ radiculopathy; Cervical disc disorder with radiculopathy of cervical region; Chronic upper extremity weakness (Left); Abnormal nerve conduction studies (06/08/2017); 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); Spondylosis without myelopathy or radiculopathy, cervical region; Cervical  facet syndrome (Bilateral); Abnormal MRI, cervical spine (10/02/2019 & 08/13/2017); History of fusion of cervical spine; Chronic neck pain (4th area of Pain) (Bilateral) (L>R); Chronic neck pain with history of cervical spinal surgery; LLQ abdominal pain; Neuropathic pain; Nondisplaced fracture of proximal phalanx of left thumb with routine healing; 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, sequelae (Right); Cervical paraspinal muscle spasm; Trigger point of neck; Chronic hip pain (Bilateral); Sacroiliac joint pain (Left); Sacroiliac joint dysfunction (Left); Somatic dysfunction of sacroiliac joint (Left); Chronic low back pain (Bilateral) w/o sciatica; DDD (degenerative disc disease), lumbosacral; Failed back surgical syndrome; Neuropathy of right foot; Lumbar facet syndrome (Bilateral); Chronic sacroiliac joint pain (Bilateral); Lumbosacral facet arthropathy (L4-5, L5-S1); Spondylosis without myelopathy or radiculopathy, lumbosacral region; and Lateral epicondylitis of right elbow on their pertinent problem list. °Pain Assessment: Severity of Chronic pain is reported as a 5 /10. Location: Foot Right/Denies. Onset: More than a month ago. Quality: Constant, Burning, Discomfort, Aching. Timing: Constant. Modifying factor(s): nothing. °Vitals:  height is 5' 8" (1.727 m) and weight is 198 lb (89.8 kg). His temperature is 98 °F (36.7 °C). His blood pressure is 102/64 and his pulse is 80.  ° °Reason for encounter: both, medication management and post-procedure evaluation and assessment.   The patient indicates doing well with the current medication regimen. No adverse reactions or side effects reported to the medications.  The patient recently had a third cervical epidural steroid injection and according to him it provided him with 100% relief of the pain for 1 week, after that it started coming back.  We have had a long conversation regarding this and I have recommended that he go back to that neurosurgeons for evaluation since this  continues to return. ° °He is resisting this idea, but I have already referred this patient to Dr. Yarborough on 08/01/2019 and 04/30/2020.  Prior to that he had been seen by Dr. Henry Elsner through   a referral that I put in on 07/18/2019.  His last cervical MRI was done on 10/01/2019 and it showed severe right C5-6 neuroforaminal stenosis and mild spinal canal stenosis.  He also has moderate bilateral C7-T1 neural foraminal stenosis, all of which remains unchanged from prior MRI.  He also has an MRI of the upper extremities that confirms chronic moderately severe left mid to lower cervical polyradiculopathy.  This nerve conduction test was done by Dr. Gurney Maxin on 06/08/2017.  The patient indicates that he would like a second opinion from a different restraint.  Today I have entered a referral to [facility] neurosurgery.  RTCB: 05/24/2021 Nonopioids transfer 11/21/2019: Lyrica and Robaxin  Post-procedure evaluation    Type: Cervical Epidural Steroid injection (ESI) (Interlaminar) #3  Laterality: Midline  Level: C7-T1 Imaging: Fluoroscopy-assisted DOS: 02/10/2021  Performed by: Milinda Pointer, MD Anesthesia: Local anesthesia (1-2% Lidocaine) Anxiolysis: IV Versed         Sedation: None.   Purpose: Diagnostic/Therapeutic Indications: Cervicalgia, cervical radicular pain, degenerative disc disease, severe enough to impact quality of life or function. 1. Cervical disc disorder with radiculopathy of cervical region   2. 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 Cervical paraspinal muscle spasm   5. Cervical spondylosis w/ radiculopathy   6. Cervicalgia   7. DDD (degenerative disc disease), cervical   8. Abnormal MRI, cervical spine (10/02/2019 & 08/13/2017)    NAS-11 score:   Pre-procedure: 5 /10   Post-procedure: 6  ("It hurt worse than ever today" not numb yet)/10     Effectiveness:  Initial hour after  procedure: 100 %. Subsequent 4-6 hours post-procedure: 100 %. Analgesia past initial 6 hours: 100 % (lasted for one week). Ongoing improvement:  Analgesic: The patient indicates that the cervical epidural steroid injection provided him with 100% relief of the pain that lasted for approximately 1 week.  Unfortunately the pain has returned. Function: Back to baseline ROM: Back to baseline  Pharmacotherapy Assessment  Analgesic: Hydrocodone/APAP 5/325 1 tablet every 6 hours (20 mg/day of hydrocodone).  (Unable to tolerate an oxycodone IR trial due to stomachaches, nausea, headaches, and excessive somnolence.  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   Monitoring: [place] PMP: PDMP reviewed during this encounter.       Pharmacotherapy: No side-effects or adverse reactions reported. Compliance: No problems identified. Effectiveness: Clinically acceptable.  [name], RN  02/23/2021  8:49 AM  Sign when Signing Visit Nursing Pain Medication Assessment:  Safety precautions to be maintained throughout the outpatient stay will include: orient to surroundings, keep bed in low position, maintain call bell within reach at all times, provide assistance with transfer out of bed and ambulation.  Medication Inspection Compliance: Pill count conducted under aseptic conditions, in front of the patient. Neither the pills nor the bottle was removed from the patient's sight at any time. Once count was completed pills were immediately returned to the patient in their original bottle.  Medication: Hydrocodone/APAP Pill/Patch Count:  13 of 120 pills remain Pill/Patch Appearance: Markings consistent with prescribed medication Bottle Appearance: Standard pharmacy container. Clearly labeled. Filled Date: 1 / 23 / 2023 Last Medication intake:  TodaySafety precautions to be maintained throughout the outpatient stay will include: orient to surroundings, keep bed in low position,  maintain call bell within reach at all times, provide assistance with transfer out of bed and ambulation.     UDS:  Summary  Date Value Ref Range Status  °08/27/2020 Note  Final  °  Comment:  °  ==================================================================== °[employer] Select 13 (MW) °==================================================================== °Test                             Result       Flag       Units ° °Drug Present and Declared for Prescription Verification °  Hydrocodone                    1811         EXPECTED   ng/mg creat °  Hydromorphone                  617          EXPECTED   ng/mg creat °  Dihydrocodeine                 114          EXPECTED   ng/mg creat °  Norhydrocodone                 1799         EXPECTED   ng/mg creat °   Sources of hydrocodone include scheduled prescription medications. °   Hydromorphone, dihydrocodeine and norhydrocodone are expected °   metabolites of hydrocodone. Hydromorphone and dihydrocodeine are °   also available as scheduled prescription medications. ° °Drug Present not Declared for Prescription Verification °  Oxazepam                       42           UNEXPECTED ng/mg creat °  Temazepam                      41           UNEXPECTED ng/mg creat °   Oxazepam and temazepam are expected metabolites of diazepam. °   Oxazepam is also an expected metabolite of other benzodiazepine °   drugs, including chlordiazepoxide, prazepam, clorazepate, halazepam, °   and temazepam.  Oxazepam and temazepam are available as scheduled °   prescription medications. ° °==================================================================== °Test                      Result    Flag   Units      Ref Range °  Creatinine              76               mg/dL      >=20 °==================================================================== °Declared Medications: ° The flagging and interpretation on this report are based on the ° following declared medications.  Unexpected results may  arise from ° inaccuracies in the declared medications. ° ° **Note: The testing scope of this panel includes these medications: ° ° Hydrocodone (Norco) ° ° **Note: The testing scope of this panel does not include the ° following reported medications: ° ° Acetaminophen (Norco) ° Albuterol (Ventolin HFA) ° Aspirin ° Cetirizine (Zyrtec) ° Fluticasone (Flonase) ° Hydrochlorothiazide (Hydrodiuril) ° Ibuprofen (Advil) ° Isosorbide (Imdur) ° Losartan (Cozaar) ° Melatonin ° Methocarbamol (Robaxin) ° Mometasone ° Montelukast (Singulair) ° Naloxone (Narcan) ° Nitroglycerin (Nitrostat) ° Pregabalin (Lyrica) ° Rosuvastatin (Crestor) ° Topical ° Umeclidinium (Anoro) ° Varenicline (Chantix) ° Vilanterol (Anoro) °==================================================================== °For clinical consultation, please call [phone]. °==================================================================== °  °  ° °  ROS  Constitutional: Denies any fever or chills Gastrointestinal: No reported hemesis, hematochezia, vomiting, or acute GI distress Musculoskeletal: Denies any acute onset joint swelling, redness, loss of ROM, or weakness Neurological: No reported episodes of acute onset apraxia, aphasia, dysarthria, agnosia, amnesia, paralysis, loss of coordination, or loss of consciousness  Medication Review  HYDROcodone-acetaminophen, Melatonin, Secukinumab (300 MG Dose), Turmeric, albuterol, aspirin EC, cetirizine, clobetasol, clobetasol cream, fluocinolone, fluticasone, hydrochlorothiazide, ibuprofen, isosorbide mononitrate, ketorolac, lidocaine, losartan, methocarbamol, montelukast, naloxone, nitroGLYCERIN, pregabalin, rosuvastatin, tadalafil, triamcinolone ointment, umeclidinium-vilanterol, and varenicline  History Review  Allergy: Mr. Zenz is allergic to lisinopril and oxycodone. Drug: Mr. Nunnery  reports no history of drug use. Alcohol:  reports current alcohol use of about 4.0 standard drinks per week. Tobacco:   reports that he has been smoking cigarettes. He has a 25.50 pack-year smoking history. He has quit using smokeless tobacco. Social: Mr. Jeansonne  reports that he has been smoking cigarettes. He has a 25.50 pack-year smoking history. He has quit using smokeless tobacco. He reports current alcohol use of about 4.0 standard drinks per week. He reports that he does not use drugs. Medical:  has a past medical history of Chronic foot pain, right (2015), COPD (chronic obstructive pulmonary disease) ([place]), Coronary artery disease, Cough syncope, Emphysema lung ([place]), Family history of adverse reaction to anesthesia, History of kidney stones, Hyperlipidemia, Hypertension, Kidney stone, Leucocytosis, Myocardial infarction ([place]) (2015), and OSA on CPAP. Surgical: Mr. Gladney  has a past surgical history that includes Lumbar laminectomy (1989, 1999); Cervical fusion (1988, 1998); Liver surgery (2015); Partial colectomy (1990); Inguinal hernia repair (Bilateral, 1975); Lithotripsy; Colonoscopy with propofol (N/A, 02/24/2017); Cardiac catheterization (2015); Arthrodesis metatarsal (Right, 11/26/2019); and Lesion removal (Right, 11/26/2019). Family: family history includes Alzheimer's disease in his paternal grandmother; Alzheimer's disease (age of onset: 48) in his father; Breast cancer in his maternal uncle; CAD in his mother; Dementia in his father; Diabetes in his maternal grandmother; Healthy in his sister; Heart attack in his maternal uncle; Heart failure in his maternal grandmother; Heart failure (age of onset: 65) in his mother; Non-Hodgkin's lymphoma in his sister.  Laboratory Chemistry Profile   Renal Lab Results  Component Value Date   BUN 18 01/06/2021   CREATININE 0.76 01/06/2021   BCR 24 01/06/2021   GFRAA 112 09/20/2019   GFRNONAA >60 02/23/2020    Hepatic Lab Results  Component Value Date   AST 20 01/06/2021   ALT 12 01/06/2021   ALBUMIN 4.3 01/06/2021   ALKPHOS 74 01/06/2021     Electrolytes Lab Results  Component Value Date   NA 141 01/06/2021   K 3.8 01/06/2021   CL 99 01/06/2021   CALCIUM 9.3 01/06/2021   MG 2.0 05/30/2018    Bone Lab Results  Component Value Date   25OHVITD1 32 02/17/2017   25OHVITD2 <1.0 02/17/2017   25OHVITD3 32 02/17/2017   TESTOFREE 5.2 (L) 02/28/2017   TESTOSTERONE 279 02/28/2017    Inflammation (CRP: Acute Phase) (ESR: Chronic Phase) Lab Results  Component Value Date   CRP 8 11/07/2017   ESRSEDRATE 26 11/07/2017         Note: Above Lab results reviewed.  Recent Imaging Review  DG PAIN CLINIC C-ARM 1-60 MIN NO REPORT Fluoro was used, but no Radiologist interpretation will be provided.  Please refer to "NOTES" tab for provider progress note. Note: Reviewed        Physical Exam  General appearance: Well nourished, well developed, and well hydrated. In no apparent acute distress Mental status:  Alert, oriented x 3 (person, place, & time)       °Respiratory: No evidence of acute respiratory distress °Eyes: PERLA °Vitals: BP 102/64    Pulse 80    Temp 98 °F (36.7 °C)    Ht 5' 8" (1.727 m)    Wt 198 lb (89.8 kg)    BMI 30.11 kg/m²  °BMI: Estimated body mass index is 30.11 kg/m² as calculated from the following: °  Height as of this encounter: 5' 8" (1.727 m). °  Weight as of this encounter: 198 lb (89.8 kg). °Ideal: Ideal body weight: 68.4 kg (150 lb 12.7 oz) °Adjusted ideal body weight: 77 kg (169 lb 10.8 oz) ° °Assessment  ° °Status Diagnosis  °Controlled °Controlled °Controlled 1. Chronic pain syndrome   °2. Chronic foot pain (1ry area of Pain) (Right)   °3. Chronic ankle pain (2ry area of Pain) (Right)   °4. Chronic thoracic back pain (3ry area of Pain) (Midline)   °5. Chronic neck pain (4th area of Pain) (Bilateral) (L>R)   °6. Cervicalgia   °7. Cervical facet syndrome (Bilateral)   °8. Cervical paraspinal muscle spasm   °9. DDD (degenerative disc disease), cervical   °10. DDD (degenerative disc disease), lumbosacral   °11.  Chronic upper extremity pain (Left)   °12. Failed back surgical syndrome   °13. Lumbar facet syndrome (Bilateral)   °14. Sacroiliac joint pain (Left)   °15. Pharmacologic therapy   °16. Chronic use of opiate for therapeutic purpose   °17. Encounter for chronic pain management   °18. Encounter for medication management   °19. Abnormal nerve conduction studies (06/08/2017)   °20. Abnormal MRI, cervical spine (10/02/2019 & 08/13/2017)   °  ° °Updated Problems: °No problems updated. ° °Plan of Care  °Problem-specific:  °No problem-specific Assessment & Plan notes found for this encounter. ° °Mr. Yakub L Brun has a current medication list which includes the following long-term medication(s): albuterol, cetirizine, fluticasone, hydrochlorothiazide, hydrocodone-acetaminophen, [START ON 03/25/2021] hydrocodone-acetaminophen, [START ON 04/24/2021] hydrocodone-acetaminophen, isosorbide mononitrate, losartan, methocarbamol, montelukast, nitroglycerin, pregabalin, rosuvastatin, and tadalafil. ° °Pharmacotherapy (Medications Ordered): °Meds ordered this encounter  °Medications  ° HYDROcodone-acetaminophen (NORCO) 7.5-325 MG tablet  °  Sig: Take 1 tablet by mouth every 6 (six) hours as needed for severe pain. Must last 30 days.  °  Dispense:  120 tablet  °  Refill:  0  °  DO NOT: delete (not duplicate); no partial-fill (will deny script to complete), no refill request (F/U required). DISPENSE: 1 day early if closed on fill date. WARN: No CNS-depressants within 8 hrs of med.  ° HYDROcodone-acetaminophen (NORCO) 7.5-325 MG tablet  °  Sig: Take 1 tablet by mouth every 6 (six) hours as needed for severe pain. Must last 30 days.  °  Dispense:  120 tablet  °  Refill:  0  °  DO NOT: delete (not duplicate); no partial-fill (will deny script to complete), no refill request (F/U required). DISPENSE: 1 day early if closed on fill date. WARN: No CNS-depressants within 8 hrs of med.  ° HYDROcodone-acetaminophen (NORCO) 7.5-325 MG tablet  °   Sig: Take 1 tablet by mouth every 6 (six) hours as needed for severe pain. Must last 30 days.  °  Dispense:  120 tablet  °  Refill:  0  °  DO NOT: delete (not duplicate); no partial-fill (will deny script to complete), no refill request (F/U required). DISPENSE: 1 day early if closed on fill date. WARN: No CNS-depressants within 8   hrs of med.  ° °Orders:  °Orders Placed This Encounter  °Procedures  ° Ambulatory referral to Neurosurgery  °  Referral Priority:   Routine  °  Referral Type:   Surgical  °  Referral Reason:   Specialty Services Required  °  Requested Specialty:   Neurosurgery  °  Number of Visits Requested:   1  ° °Follow-up plan:   °Return in about 3 months (around 05/24/2021) for Eval-day (M,W), (F2F), (MM).   °  °Interventional Therapies  °Risk   Complexity Considerations:   °Estimated body mass index is 31.93 kg/m² as calculated from the following: °  Height as of this encounter: 5' 8" (1.727 m). °  Weight as of this encounter: 210 lb (95.3 kg). °WNL  ° °Planned   Pending:   °Therapeutic left cervical ESI (01/15/2021)  ° °Under consideration:   °Possible spinal cord stimulator trial  °Possible bilateral cervical facet RFA   ° °Completed:   °Therapeutic/palliative left CESI x8 (12/18/2020) (6-0) (100/100/80/75-80)  °Diagnostic bilateral cervical facet MBB x2 (09/22/2017) (5-1) (100/100/100/>50)  °Diagnostic bilateral lumbar facet MBB x1 (01/29/2020) (3-0) (100/100/90/90)  °Diagnostic left SI joint block x1 (06/19/2019) (8-0) (100/100/100 x3 days/0)  °Therapeutic midline serratus posterior TPI/MNB x1 (01/11/2019)  °Diagnostic right Common Peroneal NB (C-PNB) x2 (04/14/2017) (5-0) (100/100/20/<25)  °Therapeutic right common peroneal nerve (C-PN) RFA x1 (06/21/2017) (3-0) (100/100/100 x 3 days/75-100)  °Diagnostic right superficial peroneal (S-PN) NB x5 (08/19/2020) (7-3) (100/100/100/90-100) °Therapeutic right superficial peroneal nerve (S-PN) RFA x1 (10/25/2017) (4-0) (100/100/100 x1 day/25) °Therapeutic right  deep peroneal NB (D-PNB) x1 (05/03/2019)   ° °Therapeutic   Palliative (PRN) options:   °Palliative left CESI  °Diagnostic bilateral cervical facet block #3  °Palliative right superficial peroneal NB #5  °Palliative right superficial peroneal nerve RFA #2   ° ° ° °  °Recent Visits °Date Type Provider Dept  °02/10/21 Procedure visit Naveira, Francisco, MD [facility]  °01/29/21 Office Visit Naveira, Francisco, MD [facility]  °01/15/21 Procedure visit Naveira, Francisco, MD [facility]  °01/06/21 Office Visit Naveira, Francisco, MD [facility]  °12/18/20 Procedure visit Naveira, Francisco, MD [facility]  °11/26/20 Office Visit Naveira, Francisco, MD [facility]  °Showing recent visits within past 90 days and meeting all other requirements °Today's Visits °Date Type Provider Dept  °02/23/21 Office Visit Naveira, Francisco, MD [facility]  °Showing today's visits and meeting all other requirements °Future Appointments °No visits were found meeting these conditions. °Showing future appointments within next 90 days and meeting all other requirements ° °I discussed the assessment and treatment plan with the patient. The patient was provided an opportunity to ask questions and all were answered. The patient agreed with the plan and demonstrated an understanding of the instructions. ° °Patient advised to call back or seek an in-person evaluation if the symptoms or condition worsens. ° °Duration of encounter: 30 minutes. ° °Note by: Francisco A Naveira, MD °Date: 02/23/2021; Time: 9:12 AM °

## 2021-02-23 ENCOUNTER — Ambulatory Visit: Payer: Medicaid Other | Attending: Pain Medicine | Admitting: Pain Medicine

## 2021-02-23 ENCOUNTER — Other Ambulatory Visit: Payer: Self-pay

## 2021-02-23 ENCOUNTER — Ambulatory Visit: Payer: Medicaid Other

## 2021-02-23 ENCOUNTER — Encounter: Payer: Self-pay | Admitting: Pain Medicine

## 2021-02-23 ENCOUNTER — Ambulatory Visit (INDEPENDENT_AMBULATORY_CARE_PROVIDER_SITE_OTHER): Payer: Medicaid Other | Admitting: Dermatology

## 2021-02-23 VITALS — BP 102/64 | HR 80 | Temp 98.0°F | Ht 68.0 in | Wt 198.0 lb

## 2021-02-23 DIAGNOSIS — M25571 Pain in right ankle and joints of right foot: Secondary | ICD-10-CM | POA: Diagnosis not present

## 2021-02-23 DIAGNOSIS — M5137 Other intervertebral disc degeneration, lumbosacral region: Secondary | ICD-10-CM | POA: Insufficient documentation

## 2021-02-23 DIAGNOSIS — R94130 Abnormal response to nerve stimulation, unspecified: Secondary | ICD-10-CM | POA: Diagnosis not present

## 2021-02-23 DIAGNOSIS — M533 Sacrococcygeal disorders, not elsewhere classified: Secondary | ICD-10-CM

## 2021-02-23 DIAGNOSIS — M79602 Pain in left arm: Secondary | ICD-10-CM | POA: Insufficient documentation

## 2021-02-23 DIAGNOSIS — Z79891 Long term (current) use of opiate analgesic: Secondary | ICD-10-CM

## 2021-02-23 DIAGNOSIS — M542 Cervicalgia: Secondary | ICD-10-CM

## 2021-02-23 DIAGNOSIS — G894 Chronic pain syndrome: Secondary | ICD-10-CM

## 2021-02-23 DIAGNOSIS — M62838 Other muscle spasm: Secondary | ICD-10-CM | POA: Diagnosis not present

## 2021-02-23 DIAGNOSIS — M5030 Other cervical disc degeneration, unspecified cervical region: Secondary | ICD-10-CM | POA: Diagnosis not present

## 2021-02-23 DIAGNOSIS — G8929 Other chronic pain: Secondary | ICD-10-CM

## 2021-02-23 DIAGNOSIS — M961 Postlaminectomy syndrome, not elsewhere classified: Secondary | ICD-10-CM | POA: Diagnosis not present

## 2021-02-23 DIAGNOSIS — M546 Pain in thoracic spine: Secondary | ICD-10-CM | POA: Diagnosis not present

## 2021-02-23 DIAGNOSIS — M79671 Pain in right foot: Secondary | ICD-10-CM | POA: Diagnosis not present

## 2021-02-23 DIAGNOSIS — R937 Abnormal findings on diagnostic imaging of other parts of musculoskeletal system: Secondary | ICD-10-CM

## 2021-02-23 DIAGNOSIS — M47812 Spondylosis without myelopathy or radiculopathy, cervical region: Secondary | ICD-10-CM

## 2021-02-23 DIAGNOSIS — M47816 Spondylosis without myelopathy or radiculopathy, lumbar region: Secondary | ICD-10-CM

## 2021-02-23 DIAGNOSIS — M51379 Other intervertebral disc degeneration, lumbosacral region without mention of lumbar back pain or lower extremity pain: Secondary | ICD-10-CM

## 2021-02-23 DIAGNOSIS — Z79899 Other long term (current) drug therapy: Secondary | ICD-10-CM | POA: Diagnosis not present

## 2021-02-23 DIAGNOSIS — L400 Psoriasis vulgaris: Secondary | ICD-10-CM | POA: Diagnosis not present

## 2021-02-23 MED ORDER — HYDROCODONE-ACETAMINOPHEN 7.5-325 MG PO TABS: 1.0000 | ORAL_TABLET | Freq: Four times a day (QID) | ORAL | 0 refills | Status: DC | PRN

## 2021-02-23 NOTE — Progress Notes (Signed)
Nursing Pain Medication Assessment:  Safety precautions to be maintained throughout the outpatient stay will include: orient to surroundings, keep bed in low position, maintain call bell within reach at all times, provide assistance with transfer out of bed and ambulation.  Medication Inspection Compliance: Pill count conducted under aseptic conditions, in front of the patient. Neither the pills nor the bottle was removed from the patient's sight at any time. Once count was completed pills were immediately returned to the patient in their original bottle.  Medication: Hydrocodone/APAP Pill/Patch Count:  13 of 120 pills remain Pill/Patch Appearance: Markings consistent with prescribed medication Bottle Appearance: Standard pharmacy container. Clearly labeled. Filled Date: 1 / 23 / 2023 Last Medication intake:  TodaySafety precautions to be maintained throughout the outpatient stay will include: orient to surroundings, keep bed in low position, maintain call bell within reach at all times, provide assistance with transfer out of bed and ambulation.

## 2021-02-23 NOTE — Progress Notes (Signed)
Total Surface Area: 76cm2 Total Energy: 53.12J  Documentation: I have reviewed the above documentation for accuracy and completeness, and I agree with the above.  [name] MD

## 2021-02-23 NOTE — Patient Instructions (Signed)

## 2021-02-25 ENCOUNTER — Telehealth: Payer: Self-pay

## 2021-02-25 NOTE — Telephone Encounter (Signed)
Patient has Medicaid so the only thing we go do is re try the PA in 60 days.  Their formulary states Humira, Enbrel and Cosentyx but I think they will only cover Cosentyx once Humira AND Enbrel are tried and failed.

## 2021-02-25 NOTE — Telephone Encounter (Signed)
Patient's appeal for Cosentyx was denied by Medicaid. We can continue on samples and I will request more from rep, if that's what you would like to do?

## 2021-02-25 NOTE — Telephone Encounter (Signed)
We can file a state fair hearing process: We can verbally call [phone] or fax a written request to: Office of Administrative Hearings [address] [place], [place]. [address] Phone: [phone] Fax: [phone]  Back of denial states "We may condiser approval of this drug in certain situations when he has tried and failed or unable to tolerated phototherapy AND on of the following medications: Soriatane, Methotrexate or Cyclosporine.

## 2021-02-26 ENCOUNTER — Other Ambulatory Visit: Payer: Self-pay

## 2021-02-26 ENCOUNTER — Ambulatory Visit: Payer: Medicaid Other

## 2021-02-26 ENCOUNTER — Ambulatory Visit (INDEPENDENT_AMBULATORY_CARE_PROVIDER_SITE_OTHER): Payer: Medicaid Other | Admitting: Dermatology

## 2021-02-26 DIAGNOSIS — L400 Psoriasis vulgaris: Secondary | ICD-10-CM

## 2021-02-26 NOTE — Progress Notes (Signed)
Total Surface Area: 88cm2 Total Energy: 70.64J  [name], RMA  Documentation: I have reviewed the above documentation for accuracy and completeness, and I agree with the above.  [name] MD

## 2021-03-02 ENCOUNTER — Other Ambulatory Visit: Payer: Self-pay

## 2021-03-02 ENCOUNTER — Ambulatory Visit (INDEPENDENT_AMBULATORY_CARE_PROVIDER_SITE_OTHER): Payer: Medicaid Other | Admitting: Dermatology

## 2021-03-02 DIAGNOSIS — L400 Psoriasis vulgaris: Secondary | ICD-10-CM | POA: Diagnosis not present

## 2021-03-02 NOTE — Progress Notes (Signed)
Total Surface Area: 76cm2 Total Energy: 70.15J  [name], RMA  Documentation: I have reviewed the above documentation for accuracy and completeness, and I agree with the above.  [name] MD

## 2021-03-05 ENCOUNTER — Ambulatory Visit: Payer: Medicaid Other | Admitting: Podiatry

## 2021-03-05 ENCOUNTER — Other Ambulatory Visit: Payer: Self-pay

## 2021-03-05 ENCOUNTER — Ambulatory Visit (INDEPENDENT_AMBULATORY_CARE_PROVIDER_SITE_OTHER): Payer: Medicaid Other | Admitting: Dermatology

## 2021-03-05 DIAGNOSIS — M778 Other enthesopathies, not elsewhere classified: Secondary | ICD-10-CM | POA: Diagnosis not present

## 2021-03-05 DIAGNOSIS — L400 Psoriasis vulgaris: Secondary | ICD-10-CM

## 2021-03-05 NOTE — Progress Notes (Signed)
Total Surface Area: 64cm2 ?Total Energy: 69.70J ? ?[name], CMA ? ?Documentation: I have reviewed the above documentation for accuracy and completeness, and I agree with the above. ? ?[name] MD  ? ?
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
## 2021-03-09 ENCOUNTER — Ambulatory Visit (INDEPENDENT_AMBULATORY_CARE_PROVIDER_SITE_OTHER): Payer: Medicaid Other | Admitting: Dermatology

## 2021-03-09 ENCOUNTER — Other Ambulatory Visit: Payer: Self-pay

## 2021-03-09 ENCOUNTER — Ambulatory Visit: Payer: Medicaid Other

## 2021-03-09 DIAGNOSIS — L400 Psoriasis vulgaris: Secondary | ICD-10-CM | POA: Diagnosis not present

## 2021-03-09 NOTE — Progress Notes (Signed)
Total Surface Area: 72cm2 ?Total Energy: 87.84J ? ?[name], RMA ? ?Documentation: I have reviewed the above documentation for accuracy and completeness, and I agree with the above. ? ?[name] MD  ? ?

## 2021-03-10 ENCOUNTER — Ambulatory Visit: Payer: Medicaid Other | Admitting: Dermatology

## 2021-03-10 DIAGNOSIS — Z79899 Other long term (current) drug therapy: Secondary | ICD-10-CM

## 2021-03-10 DIAGNOSIS — L400 Psoriasis vulgaris: Secondary | ICD-10-CM | POA: Diagnosis not present

## 2021-03-10 MED ORDER — ACITRETIN 25 MG PO CAPS: 25.0000 mg | ORAL_CAPSULE | Freq: Every day | ORAL | 0 refills | Status: DC

## 2021-03-10 MED ORDER — MUPIROCIN 2 % EX OINT: 1.0000 "application " | TOPICAL_OINTMENT | Freq: Two times a day (BID) | CUTANEOUS | 0 refills | Status: DC

## 2021-03-10 MED ORDER — ACITRETIN 25 MG PO CAPS: 25.0000 mg | ORAL_CAPSULE | Freq: Every day | ORAL | 2 refills | Status: AC

## 2021-03-10 NOTE — Patient Instructions (Addendum)
For ears ?Restart fluocinolone drops  apply to ear drops 1 - 2 times daily as needed  ? ?Apply mupirocin twice daily until healed.  ? ?Cerave mix with clobetasol use 1 - 2 times daily as needed for less severe areas ? ? ?For other affected areas of body  ? ?Clobetasol cream to 1 - 2 times daily as needed to more severe areas ? ?Cerave mix with clobetasol use 1 - 2 times daily as needed for less severe areas ? ? ?Topical steroids (such as triamcinolone, fluocinolone, fluocinonide, mometasone, clobetasol, halobetasol, betamethasone, hydrocortisone) can cause thinning and lightening of the skin if they are used for too long in the same area. Your physician has selected the right strength medicine for your problem and area affected on the body. Please use your medication only as directed by your physician to prevent side effects.  ? ? ?If You Need Anything After Your Visit ? ?If you have any questions or concerns for your doctor, please call our main line at [phone] and press option 4 to reach your doctor's medical assistant. If no one answers, please leave a voicemail as directed and we will return your call as soon as possible. Messages left after 4 pm will be answered the following business day.  ? ?You may also send [place] a message via MyChart. We typically respond to MyChart messages within 1-2 business days. ? ?For prescription refills, please ask your pharmacy to contact our office. Our fax number is [phone]. ? ?If you have an urgent issue when the clinic is closed that cannot wait until the next business day, you can page your doctor at the number below.   ? ?Please note that while we do our best to be available for urgent issues outside of office hours, we are not available 24/7.  ? ?If you have an urgent issue and are unable to reach [place], you may choose to seek medical care at your doctor's office, retail clinic, urgent care center, or emergency room. ? ?If you have a medical emergency, please  immediately call 911 or go to the emergency department. ? ?Pager Numbers ? ?- Dr. Nehemiah Massed: [phone] ? ?- Dr. Laurence Ferrari: [phone] ? ?- Dr. Nicole Kindred: [phone] ? ?In the event of inclement weather, please call our main line at [phone] for an update on the status of any delays or closures. ? ?Dermatology Medication Tips: ?Please keep the boxes that topical medications come in in order to help keep track of the instructions about where and how to use these. Pharmacies typically print the medication instructions only on the boxes and not directly on the medication tubes.  ? ?If your medication is too expensive, please contact our office at [phone] option 4 or send [place] a message through [place].  ? ?We are unable to tell what your co-pay for medications will be in advance as this is different depending on your insurance coverage. However, we may be able to find a substitute medication at lower cost or fill out paperwork to get insurance to cover a needed medication.  ? ?If a prior authorization is required to get your medication covered by your insurance company, please allow [place] 1-2 business days to complete this process. ? ?Drug prices often vary depending on where the prescription is filled and some pharmacies may offer cheaper prices. ? ?The website www.goodrx.com contains coupons for medications through different pharmacies. The prices here do not account for what the cost may be with help from insurance (it may be  cheaper with your insurance), but the website can give you the price if you did not use any insurance.  ?- You can print the associated coupon and take it with your prescription to the pharmacy.  ?- You may also stop by our office during regular business hours and pick up a GoodRx coupon card.  ?- If you need your prescription sent electronically to a different pharmacy, notify our office through [facility] or by phone at [phone] option 4. ? ? ? ? ?Si Usted Necesita Algo Despu?s  de Su Visita ? ?Tambi?n puede enviarnos un mensaje a trav?s de MyChart. Por lo general respondemos a los mensajes de MyChart en el transcurso de 1 a 2 d?as h?biles. ? ?Para renovar recetas, por favor pida a su farmacia que se ponga en contacto con nuestra oficina. Nuestro n?mero de fax es el [phone]. ? ?Si tiene un asunto urgente cuando la cl?nica est? cerrada y que no puede esperar hasta el siguiente d?a h?bil, puede llamar/localizar a su doctor(a) al n?mero que aparece a continuaci?n.  ? ?Por favor, tenga en cuenta que aunque hacemos todo lo posible para estar disponibles para asuntos urgentes fuera del horario de oficina, no estamos disponibles las 24 horas del d?a, los 7 d?as de la semana.  ? ?Si tiene un problema urgente y no puede comunicarse con nosotros, puede optar por buscar atenci?n m?dica  en el consultorio de su doctor(a), en una cl?nica privada, en un centro de atenci?n urgente o en una sala de emergencias. ? ?Si tiene Engineer, maintenance (IT) m?dica, por favor llame inmediatamente al 911 o vaya a la sala de emergencias. ? ?N?meros de b?per ? ?- Dr. Nehemiah Massed: [phone] ? ?- Dra. Moye: [phone] ? ?- Dra. Nicole Kindred: [phone] ? ?En caso de inclemencias del tiempo, por favor llame a nuestra l?nea principal al [phone] para una actualizaci?n sobre el estado de cualquier retraso o cierre. ? ?Consejos para la medicaci?n en dermatolog?a: ?Por favor, guarde las cajas en las que vienen los medicamentos de uso t?pico para ayudarle a seguir las instrucciones sobre d?nde y c?mo usarlos. Las farmacias generalmente imprimen las instrucciones del medicamento s?lo en las cajas y no directamente en los tubos del [place].  ? ?Si su medicamento es muy caro, por favor, p?ngase en contacto con Zigmund Daniel llamando al [phone] y presione la opci?n 4 o env?enos un mensaje a trav?s de MyChart.  ? ?No podemos decirle cu?l ser? su copago por los medicamentos por adelantado ya que esto es diferente dependiendo  de la cobertura de su seguro. Sin embargo, es posible que podamos encontrar un medicamento sustituto a Electrical engineer un formulario para que el seguro cubra el medicamento que se considera necesario.  ? ?Si se requiere Ardelia Mems autorizaci?n previa para que su compa??a de seguros [place] su medicamento, por favor perm?tanos de 1 a 2 d?as h?biles para completar este proceso. ? ?Los precios de los medicamentos var?an con frecuencia dependiendo del Environmental consultant de d?nde se surte la receta y alguna farmacias pueden ofrecer precios m?s baratos. ? ?El sitio web www.goodrx.com tiene cupones para medicamentos de Airline pilot. Los precios aqu? no tienen en cuenta lo que podr?a costar con la ayuda del seguro (puede ser m?s barato con su seguro), pero el sitio web puede darle el precio si no utiliz? ning?n seguro.  ?- Puede imprimir el cup?n correspondiente y llevarlo con su receta a la farmacia.  ?- Tambi?n puede pasar por nuestra oficina durante el horario de atenci?n regular y Charity fundraiser  una tarjeta de cupones de GoodRx.  ?- Si necesita que su receta se env?e electr?nicamente a Chiropodist, informe a nuestra oficina a trav?s de MyChart de [place] o por tel?fono llamando al [phone] y presione la opci?n 4. ? ?

## 2021-03-10 NOTE — Progress Notes (Signed)
? ?Follow-Up Visit ?  ?Subjective  ?Caleb [name] is a 66 y.o. male who presents for the following: Follow-up (Patient here today for 1 month follow up on psoriasis at legs. Patient reports that psoriasis at legs has improved a lot since last visit. He is currently being treated with Deeann Cree on Mondays and Thursday. Patient also using clobetasol cerave mix and cosentyx samples. Patient reports some  psoriasis flares in ears today, but overall the large plaques are thinning. ). Joints feel much better on Cosentyx.  He has had 2 weeks of loading dose samples. ? ? ? ?The following portions of the chart were reviewed this encounter and updated as appropriate:   ?  ? ?Review of Systems: No other skin or systemic complaints except as noted in HPI or Assessment and Plan. ? ? ?Objective  ?Well appearing patient in no apparent distress; mood and affect are within normal limits. ? ?A focused examination was performed including ears, arms, legs, right hip. Relevant physical exam findings are noted in the Assessment and Plan. ? ?bilateral ears, legs, right hip ?Erythema and crusting of the antihelix/cura of L ear.  Pink scaly plaque with excoriations L forearm and elbow, Scaly plaque L pretibia, R hip ? ? 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 ?Chronic and persistent condition with duration or expected duration over one year. Condition is bothersome/symptomatic for patient. Currently flared.  He has been improving on Cosentyx samples x 2 weeks, but insurance has denied covering it.  He needs to try and fail Soriatane, MTX, or Cyclosporine AND Phototherapy. ? ?Psoriasis - severe on systemic ?biologic? treatment injections.  Psoriasis is a chronic non-curable, but treatable genetic/hereditary disease that may have other systemic features affecting other organ systems such as joints (Psoriatic Arthritis).  It is linked with heart disease, inflammatory bowel disease, non-alcoholic fatty liver  disease, and depression. Significant skin psoriasis and/or psoriatic arthritis may have significant symptoms and affects activities of daily activity and often benefits from systemic ?biologic? injection treatments.  These ?biologic? treatments have some potential side effects including immunosuppression and require pre-treatment laboratory screening and periodic laboratory monitoring and periodic in person evaluation and monitoring by the attending dermatologist physician (long term medication management).  ? ?Reviewed patient's most recent blood work - lipids, LFTs, normal  ?  ?Start Soriatane 25 mg capsule - take 1 capsule by mouth daily.  ?Discussed if causing too much dryness will reduce to 1/2 tab daily. ?Discussed risks increased Tgs and LFTs, dryness ? ?Start Mupirocin 2 % ointment - apply topically to affected areas at ears twice daily until healed.  ?  ?Continue clobetasol/CeraVe mix 1-2 times daily prn for less severe areas. Avoid applying to face, groin, and axilla. Use as directed. Long-term use can cause thinning of the skin. ? ?Continue clobetasol cream to more severe areas 1-2 times daily prn. Avoid applying to face, groin, and axilla. Use as directed. Long-term use can cause thinning of the skin. ? ?Continue Cosentyx loading dose. He is on week #3 of loading dose. Samples (150 mg x 2) given to patient. He will self inject at home. Will continue weekly loading dose with samples for now. ?Lot # [id number]  Exp: 02/2022 ? ?Pt is currently using phototherapy and just started patient on Soriatane today.  After using these therapies will resubmit PA for Cosentyx.  Cosentyx is really helping him so will continue to use samples while available. ?  ?Will recheck blood work in 1 month  to check Lipids/LFTs ? ?Topical steroids (such as triamcinolone, fluocinolone, fluocinonide, mometasone, clobetasol, halobetasol, betamethasone, hydrocortisone) can cause thinning and lightening of the skin if they are used for too  long in the same area. Your physician has selected the right strength medicine for your problem and area affected on the body. Please use your medication only as directed by your physician to prevent side effects.  ? ? ?mupirocin ointment (BACTROBAN) 2 % - bilateral ears, legs, right hip ?Apply 1 application. topically 2 (two) times daily. Apply to affected areas of ears until healed. ? ?Related Medications ?acitretin (SORIATANE) 25 MG capsule ?Take 1 capsule (25 mg total) by mouth daily. ? ? ? ? ?Return for 1 week pick up cosentyx sample , 1 month psoriasis follow up . ?I, Ruthell Rummage, CMA, am acting as scribe for [name], MD. ? ?Documentation: I have reviewed the above documentation for accuracy and completeness, and I agree with the above. ? ?[name] MD  ?

## 2021-03-12 ENCOUNTER — Ambulatory Visit (INDEPENDENT_AMBULATORY_CARE_PROVIDER_SITE_OTHER): Payer: Medicaid Other | Admitting: Dermatology

## 2021-03-12 ENCOUNTER — Other Ambulatory Visit: Payer: Self-pay

## 2021-03-12 DIAGNOSIS — L400 Psoriasis vulgaris: Secondary | ICD-10-CM

## 2021-03-12 NOTE — Progress Notes (Signed)
Total Surface Area: 72cm2 ?Total Energy: 101.02J ? ?[name], RMA ? ?Documentation: I have reviewed the above documentation for accuracy and completeness, and I agree with the above. ? ?[name] MD  ? ?

## 2021-03-16 ENCOUNTER — Ambulatory Visit (INDEPENDENT_AMBULATORY_CARE_PROVIDER_SITE_OTHER): Payer: Medicaid Other | Admitting: Dermatology

## 2021-03-16 ENCOUNTER — Other Ambulatory Visit: Payer: Self-pay

## 2021-03-16 DIAGNOSIS — L400 Psoriasis vulgaris: Secondary | ICD-10-CM | POA: Diagnosis not present

## 2021-03-16 NOTE — Progress Notes (Signed)
Total Surface Area: 60cm2 ?Total Energy: 96.78J ? ?Patient provided week 4 of loading dose for Cosentyx. ?LOT: [id number] ?EXP: 02/2022 ? ?[name], RMA ? ?Documentation: I have reviewed the above documentation for accuracy and completeness, and I agree with the above. ? ?[name] MD  ?

## 2021-03-19 ENCOUNTER — Other Ambulatory Visit: Payer: Self-pay

## 2021-03-19 ENCOUNTER — Ambulatory Visit (INDEPENDENT_AMBULATORY_CARE_PROVIDER_SITE_OTHER): Payer: Medicaid Other | Admitting: Dermatology

## 2021-03-19 DIAGNOSIS — L400 Psoriasis vulgaris: Secondary | ICD-10-CM

## 2021-03-19 NOTE — Progress Notes (Signed)
Patient here today for xtrac treatment.  ? ?Total Surface Area: 72cm2 ?Total Energy: 133.49 ? ?[name], RMA ? ?Documentation: I have reviewed the above documentation for accuracy and completeness, and I agree with the above. ? ?[name] MD  ? ?

## 2021-03-23 ENCOUNTER — Ambulatory Visit (INDEPENDENT_AMBULATORY_CARE_PROVIDER_SITE_OTHER): Payer: Medicaid Other | Admitting: Dermatology

## 2021-03-23 ENCOUNTER — Other Ambulatory Visit: Payer: Self-pay

## 2021-03-23 DIAGNOSIS — L400 Psoriasis vulgaris: Secondary | ICD-10-CM | POA: Diagnosis not present

## 2021-03-24 NOTE — Progress Notes (Signed)
Patient here today for xtrac treatment for psoriasis.  ?Total Surface Area: 56cm2 ?Total Energy: 103.82J ? ?Patient also provided with week 5 of loading dose. ?LOT: [id number] ?EXP:07/2022 ? ?Patient states he has d/c the Soriatane RX due to being too dry. Patient states medication dried out his neck, lips and throat. He stopped over the weekend. ? ?[name], RMA ? ?Documentation: I have reviewed the above documentation for accuracy and completeness, and I agree with the above. ? ?[name] MD  ? ?

## 2021-03-25 ENCOUNTER — Telehealth: Payer: Self-pay

## 2021-03-25 NOTE — Telephone Encounter (Signed)
He wanted to let Dena know he does still have [employer]  ?

## 2021-03-26 ENCOUNTER — Ambulatory Visit: Payer: Medicaid Other

## 2021-03-26 ENCOUNTER — Ambulatory Visit (INDEPENDENT_AMBULATORY_CARE_PROVIDER_SITE_OTHER): Payer: Medicaid Other | Admitting: Dermatology

## 2021-03-26 ENCOUNTER — Other Ambulatory Visit: Payer: Self-pay

## 2021-03-26 ENCOUNTER — Other Ambulatory Visit: Payer: Self-pay | Admitting: *Deleted

## 2021-03-26 ENCOUNTER — Encounter: Payer: Self-pay | Admitting: Pain Medicine

## 2021-03-26 DIAGNOSIS — L400 Psoriasis vulgaris: Secondary | ICD-10-CM | POA: Diagnosis not present

## 2021-03-26 NOTE — Progress Notes (Signed)
Patient here today for Xtrac treatment.  ? ?Total Surface Area: 72cm2 ?Total Energy: 133.49J ? ?[name], RMA ? ?Documentation: I have reviewed the above documentation for accuracy and completeness, and I agree with the above. ? ?[name] MD  ? ?

## 2021-03-29 NOTE — Progress Notes (Signed)
Patient: Caleb [name]  Service Category: E/M  Provider: Gaspar Cola, MD  ?DOB: July 23, 1955  DOS: 03/30/2021  Location: Office  ?MRN: [id number]  Setting: Ambulatory outpatient  Referring Provider: Virginia Crews, MD  ?Type: Established Patient  Specialty: Interventional Pain Management  PCP: Virginia Crews, MD  ?Location: Remote location  Delivery: TeleHealth    ? ?Virtual Encounter - Pain Management ?PROVIDER NOTE: Information contained herein reflects review and annotations entered in association with encounter. Interpretation of such information and data should be left to medically-trained personnel. Information provided to patient can be located elsewhere in the medical record under "Patient Instructions". Document created using STT-dictation technology, any transcriptional errors that may result from process are unintentional.  ?  ?Contact & Pharmacy ?Preferred: [phone] ?Home: [phone] (home) ?Mobile: [phone] (mobile) ?E-mail: [email or web address]  ?[id number]?Phone: [phone]: [phone]? ?[id number] [place]?Phone: [phone]: [phone]? ?[place]?Phone: [phone]: [phone]?  ?Pre-screening  ?Mr. HNyra Capesoffered "in-person" vs "virtual" encounter. He indicated preferring virtual for this encounter.  ? ?Reason ?COVID-19*  Social distancing based on CDC and AMA recommendations.  ? ?I contacted RMerian Capronon 03/30/2021 via telephone.      I clearly identified myself as FGaspar Cola MD. I verified that I was speaking with the correct person using two identifiers (Name: RPHEONIX CLINKSCALE and date of birth: [birth year]. ? ?Consent ?I sought verbal advanced  consent from [name] virtual visit interactions. I informed Mr. HOakleyof possible security and privacy concerns, risks, and limitations associated with providing "not-in-person" medical evaluation and management services. I also informed Mr. HMossbergof the availability of "in-person" appointments. Finally, I informed him that there would be a charge for the virtual visit and that he could be  personally, fully or partially, financially responsible for it. Mr. HTileyexpressed understanding and agreed to proceed.  ? ?Historic Elements   ?Mr. RJAMAEL HOFFMANNis a 66y.o. year old, male patient evaluated today after our last contact on 02/23/2021. Mr. HMcconathy has a past medical history of Chronic foot pain, right (2015), COPD (chronic obstructive pulmonary disease) ([place], Coronary artery disease, Cough syncope, Emphysema lung ([place], Family history of adverse reaction to anesthesia, History of kidney stones, Hyperlipidemia, Hypertension, Kidney stone, Leucocytosis, Myocardial infarction ([place] (2015), and OSA on CPAP. He also  has a past surgical history that includes Lumbar laminectomy (1989, 1999); Cervical fusion (1988, 1998); Liver surgery (2015); Partial colectomy (1990); Inguinal hernia repair (Bilateral, 1975); Lithotripsy; Colonoscopy with propofol (N/A, 02/24/2017); Cardiac catheterization (2015); Arthrodesis metatarsal (Right, 11/26/2019); and Lesion removal (Right, 11/26/2019). Mr. HBalishas a current medication list which includes the following prescription(s): acitretin, albuterol, anoro ellipta, aspirin ec, cetirizine, clobetasol, clobetasol cream, fluocinolone, fluticasone, hydrochlorothiazide, hydrocodone-acetaminophen, [START ON 04/24/2021] hydrocodone-acetaminophen, ibuprofen, isosorbide mononitrate, ketorolac, lidocaine, losartan, melatonin, methocarbamol, montelukast, mupirocin ointment, naloxone, nitroglycerin, pregabalin, rosuvastatin, cosentyx sensoready (300 mg), cosentyx sensoready (300  mg), tadalafil, triamcinolone ointment, turmeric, varenicline, and hydrocodone-acetaminophen. He  reports that he has been smoking cigarettes. He has a 25.50 pack-year smoking history. He has quit using smokeless tobacco. He reports current alcohol use of about 4.0 standard drinks per week. He reports that he does not use  drugs. Mr. Kobler is allergic to lisinopril and oxycodone.  ? ?HPI  ?Today, he is being contacted for worsening of previously known (established) problem.  The patient indicates having a flareup of his right foot pain where he is having pain mostly on the toes 2-5, with the big toe being numb.  He refers that it is a recurrence of his typical foot pain.  The last time that we treated him for this problem was on 08/19/2020.  At the time we did a palliative superficial peroneal/fibular nerve block with local anesthetic and steroid which did provide him with good relief of pain until recently when it started coming back.  This means that the treatment lasted approximately 7 months.  He would like to have that repeated. ? ?In addition to the above, the patient indicates having been to [facility] yesterday where he was seen because of his neck problems.  He is trying to avoid surgery as much as possible and he was told that perhaps doing some physical therapy and "dry needling" may help with his neck pain.  He refers that they told him that if that did not work, they would be repeating the cervical MRI.  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  I personally do not really see how that severe spinal stenosis that has not improved from 2019 to 2021 is going to be improving on a repeat MRI on 2023.  I also failed to understand how "dry needling" will end up solving a problem that  cervical epidural steroid injections, and cervical facet blocks have not been able to "fix".  Besides, although he does have some significant axial cervical pain, lately he has been experiencing more extremity symptoms secondary to what I believed to be a progressing stenosis. ? ?Pharmacotherapy Assessment  ? ?Opioid Analgesic: Hydrocodone/APAP 5/325 1 tablet every 6 hours (20 mg/day of hydrocodone).  (Unable to tolerate an oxycodone IR trial due to stomachaches, nausea, headaches, and excessive somnolence.  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  ? ?Monitoring: ?[place] PMP: PDMP reviewed during this encounter.       ?Pharmacotherapy: No side-effects or adverse reactions reported. ?Compliance: No problems identified. ?Effectiveness: Clinically acceptable. ?Plan: Refer to "POC". UDS:  ?Summary  ?Date Value Ref Range Status  ?08/27/2020 Note  Final  ?  Comment:  ?  ==================================================================== ?[employer] Select 13 (MW) ?==================================================================== ?Test                             Result       Flag       Units ? ?Drug Present and Declared for Prescription Verification ?  Hydrocodone                    1811         EXPECTED   ng/mg creat ?  Hydromorphone                  617          EXPECTED   ng/mg creat ?  Dihydrocodeine                 114          EXPECTED   ng/mg  creat ?  Norhydrocodone                 1799         EXPECTED   ng/mg creat ?   Sources of hydrocodone include scheduled prescription medications. ?   Hydromorphone, dihydrocodeine and norhydrocodone are expected ?   metabolites of hydrocodone. Hydromorphone and dihydrocodeine are ?   also available as scheduled prescription medications. ? ?Drug Present not Declared for Prescription Verification ?  Oxazepam                       42           UNEXPECTED ng/mg creat ?  Temazepam                      41           UNEXPECTED ng/mg creat ?    Oxazepam and temazepam are expected metabolites of diazepam. ?   Oxazepam is also an expected metabolite of other benzodiazepine ?   drugs, including chlordiazepoxide, prazepam, clorazepate, halazepam, ?

## 2021-03-30 ENCOUNTER — Other Ambulatory Visit: Payer: Self-pay

## 2021-03-30 ENCOUNTER — Ambulatory Visit: Payer: Medicaid Other

## 2021-03-30 ENCOUNTER — Ambulatory Visit: Payer: Medicaid Other | Attending: Pain Medicine | Admitting: Pain Medicine

## 2021-03-30 DIAGNOSIS — G894 Chronic pain syndrome: Secondary | ICD-10-CM

## 2021-03-30 DIAGNOSIS — M25571 Pain in right ankle and joints of right foot: Secondary | ICD-10-CM | POA: Diagnosis not present

## 2021-03-30 DIAGNOSIS — G8929 Other chronic pain: Secondary | ICD-10-CM

## 2021-03-30 DIAGNOSIS — Z6830 Body mass index (BMI) 30.0-30.9, adult: Secondary | ICD-10-CM | POA: Diagnosis not present

## 2021-03-30 DIAGNOSIS — I2510 Atherosclerotic heart disease of native coronary artery without angina pectoris: Secondary | ICD-10-CM | POA: Diagnosis not present

## 2021-03-30 DIAGNOSIS — G5731 Lesion of lateral popliteal nerve, right lower limb: Secondary | ICD-10-CM

## 2021-03-30 DIAGNOSIS — M79671 Pain in right foot: Secondary | ICD-10-CM

## 2021-03-30 DIAGNOSIS — M5412 Radiculopathy, cervical region: Secondary | ICD-10-CM | POA: Diagnosis not present

## 2021-03-30 DIAGNOSIS — S8411XS Injury of peroneal nerve at lower leg level, right leg, sequela: Secondary | ICD-10-CM | POA: Diagnosis not present

## 2021-03-30 DIAGNOSIS — Z7982 Long term (current) use of aspirin: Secondary | ICD-10-CM | POA: Diagnosis not present

## 2021-03-30 DIAGNOSIS — M792 Neuralgia and neuritis, unspecified: Secondary | ICD-10-CM

## 2021-03-30 DIAGNOSIS — I10 Essential (primary) hypertension: Secondary | ICD-10-CM | POA: Diagnosis not present

## 2021-03-30 DIAGNOSIS — M542 Cervicalgia: Secondary | ICD-10-CM | POA: Diagnosis not present

## 2021-03-30 DIAGNOSIS — Z981 Arthrodesis status: Secondary | ICD-10-CM | POA: Diagnosis not present

## 2021-03-30 DIAGNOSIS — Z885 Allergy status to narcotic agent status: Secondary | ICD-10-CM | POA: Diagnosis not present

## 2021-03-30 DIAGNOSIS — M25511 Pain in right shoulder: Secondary | ICD-10-CM | POA: Diagnosis not present

## 2021-03-30 DIAGNOSIS — M7912 Myalgia of auxiliary muscles, head and neck: Secondary | ICD-10-CM | POA: Diagnosis not present

## 2021-03-30 DIAGNOSIS — E785 Hyperlipidemia, unspecified: Secondary | ICD-10-CM | POA: Diagnosis not present

## 2021-03-30 DIAGNOSIS — Z79899 Other long term (current) drug therapy: Secondary | ICD-10-CM | POA: Diagnosis not present

## 2021-03-30 DIAGNOSIS — M4722 Other spondylosis with radiculopathy, cervical region: Secondary | ICD-10-CM | POA: Diagnosis not present

## 2021-03-30 DIAGNOSIS — M25512 Pain in left shoulder: Secondary | ICD-10-CM | POA: Diagnosis not present

## 2021-03-30 NOTE — Patient Instructions (Signed)
______________________________________________________________________ ? ?Preparing for Procedure with Sedation ? ?NOTICE: Due to recent regulatory changes, starting on August 04, 2020, procedures requiring intravenous (IV) sedation will no longer be performed at the [facility].  These types of procedures are required to be performed at [facility] ambulatory surgery facility.  We are very sorry for the inconvenience. ? ?Procedure appointments are limited to planned procedures: ?No Prescription Refills. ?No disability issues will be discussed. ?No medication changes will be discussed. ? ?Instructions: ?Oral Intake: Do not eat or drink anything for at least 8 hours prior to your procedure. (Exception: Blood Pressure Medication. See below.) ?Transportation: A driver is required. You may not drive yourself after the procedure. ?Blood Pressure Medicine: Do not forget to take your blood pressure medicine with a sip of water the morning of the procedure. If your Diastolic (lower reading) is above 100 mmHg, elective cases will be cancelled/rescheduled. ?Blood thinners: These will need to be stopped for procedures. Notify our staff if you are taking any blood thinners. Depending on which one you take, there will be specific instructions on how and when to stop it. ?Diabetics on insulin: Notify the staff so that you can be scheduled 1st case in the morning. If your diabetes requires high dose insulin, take only ? of your normal insulin dose the morning of the procedure and notify the staff that you have done so. ?Preventing infections: Shower with an antibacterial soap the morning of your procedure. ?Build-up your immune system: Take 1000 mg of Vitamin C with every meal (3 times a day) the day prior to your procedure. ?Antibiotics: Inform the staff if you have a condition or reason that requires you to take antibiotics before dental procedures. ?Pregnancy: If you are pregnant, call and cancel the procedure. ?Sickness: If  you have a cold, fever, or any active infections, call and cancel the procedure. ?Arrival: You must be in the facility at least 30 minutes prior to your scheduled procedure. ?Children: Do not bring children with you. ?Dress appropriately: Bring dark clothing that you would not mind if they get stained. ?Valuables: Do not bring any jewelry or valuables. ? ?Reasons to call and reschedule or cancel your procedure: (Following these recommendations will minimize the risk of a serious complication.) ?Surgeries: Avoid having procedures within 2 weeks of any surgery. (Avoid for 2 weeks before or after any surgery). ?Flu Shots: Avoid having procedures within 2 weeks of a flu shots. (Avoid for 2 weeks before or after immunizations). ?Barium: Avoid having a procedure within 7-10 days after having had a radiological study involving the use of radiological contrast. (Myelograms, Barium swallow or enema study). ?Heart attacks: Avoid any elective procedures or surgeries for the initial 6 months after a "Myocardial Infarction" (Heart Attack). ?Blood thinners: It is imperative that you stop these medications before procedures. Let us know if you if you take any blood thinner.  ?Infection: Avoid procedures during or within two weeks of an infection (including chest colds or gastrointestinal problems). Symptoms associated with infections include: Localized redness, fever, chills, night sweats or profuse sweating, burning sensation when voiding, cough, congestion, stuffiness, runny nose, sore throat, diarrhea, nausea, vomiting, cold or Flu symptoms, recent or current infections. It is specially important if the infection is over the area that we intend to treat. ?Heart and lung problems: Symptoms that may suggest an active cardiopulmonary problem include: cough, chest pain, breathing difficulties or shortness of breath, dizziness, ankle swelling, uncontrolled high or unusually low blood pressure, and/or palpitations. If you are    experiencing any of these symptoms, cancel your procedure and contact your primary care physician for an evaluation. ? ?Remember:  ?Regular Business hours are:  ?Monday to Thursday 8:00 AM to 4:00 PM ? ?Provider's Schedule: ?Sonyia Muro, MD:  ?Procedure days: Tuesday and Thursday 7:30 AM to 4:00 PM ? ?Bilal Lateef, MD:  ?Procedure days: Monday and Wednesday 7:30 AM to 4:00 PM ?______________________________________________________________________ ? ____________________________________________________________________________________________ ? ?General Risks and Possible Complications ? ?Patient Responsibilities: It is important that you read this as it is part of your informed consent. It is our duty to inform you of the risks and possible complications associated with treatments offered to you. It is your responsibility as a patient to read this and to ask questions about anything that is not clear or that you believe was not covered in this document. ? ?Patient?s Rights: You have the right to refuse treatment. You also have the right to change your mind, even after initially having agreed to have the treatment done. However, under this last option, if you wait until the last second to change your mind, you may be charged for the materials used up to that point. ? ?Introduction: Medicine is not an exact science. Everything in Medicine, including the lack of treatment(s), carries the potential for danger, harm, or loss (which is by definition: Risk). In Medicine, a complication is a secondary problem, condition, or disease that can aggravate an already existing one. All treatments carry the risk of possible complications. The fact that a side effects or complications occurs, does not imply that the treatment was conducted incorrectly. It must be clearly understood that these can happen even when everything is done following the highest safety standards. ? ?No treatment: You can choose not to proceed with the  proposed treatment alternative. The ?PRO(s)? would include: avoiding the risk of complications associated with the therapy. The ?CON(s)? would include: not getting any of the treatment benefits. These benefits fall under one of three categories: diagnostic; therapeutic; and/or palliative. Diagnostic benefits include: getting information which can ultimately lead to improvement of the disease or symptom(s). Therapeutic benefits are those associated with the successful treatment of the disease. Finally, palliative benefits are those related to the decrease of the primary symptoms, without necessarily curing the condition (example: decreasing the pain from a flare-up of a chronic condition, such as incurable terminal cancer). ? ?General Risks and Complications: These are associated to most interventional treatments. They can occur alone, or in combination. They fall under one of the following six (6) categories: no benefit or worsening of symptoms; bleeding; infection; nerve damage; allergic reactions; and/or death. ?No benefits or worsening of symptoms: In Medicine there are no guarantees, only probabilities. No healthcare provider can ever guarantee that a medical treatment will work, they can only state the probability that it may. Furthermore, there is always the possibility that the condition may worsen, either directly, or indirectly, as a consequence of the treatment. ?Bleeding: This is more common if the patient is taking a blood thinner, either prescription or over the counter (example: Goody Powders, Fish oil, Aspirin, Garlic, etc.), or if suffering a condition associated with impaired coagulation (example: Hemophilia, cirrhosis of the liver, low platelet counts, etc.). However, even if you do not have one on these, it can still happen. If you have any of these conditions, or take one of these drugs, make sure to notify your treating physician. ?Infection: This is more common in patients with a compromised  immune system, either due to disease (example:   diabetes, cancer, human immunodeficiency virus [HIV], etc.), or due to medications or treatments (example: therapies used to treat cancer and rheumatological diseas

## 2021-03-31 ENCOUNTER — Ambulatory Visit (INDEPENDENT_AMBULATORY_CARE_PROVIDER_SITE_OTHER): Payer: Medicaid Other | Admitting: Dermatology

## 2021-03-31 DIAGNOSIS — G4733 Obstructive sleep apnea (adult) (pediatric): Secondary | ICD-10-CM | POA: Diagnosis not present

## 2021-03-31 DIAGNOSIS — L400 Psoriasis vulgaris: Secondary | ICD-10-CM | POA: Diagnosis not present

## 2021-03-31 NOTE — Progress Notes (Signed)
Patient here today for xtrac treatment to Psoriasis.  ? ?Total Surface Area: 56cm2 ?Total Energy: 119.39 ?Service Code: [phone] ? ?[name], RMA ? ?Documentation: I have reviewed the above documentation for accuracy and completeness, and I agree with the above. ? ?[name] MD  ? ?

## 2021-04-02 ENCOUNTER — Ambulatory Visit: Payer: Medicaid Other

## 2021-04-02 ENCOUNTER — Ambulatory Visit (INDEPENDENT_AMBULATORY_CARE_PROVIDER_SITE_OTHER): Payer: Medicaid Other | Admitting: Dermatology

## 2021-04-02 DIAGNOSIS — L400 Psoriasis vulgaris: Secondary | ICD-10-CM | POA: Diagnosis not present

## 2021-04-02 NOTE — Progress Notes (Signed)
Patient here today for xtrac treatment for psoriasis vulgaris.  ? ?Total Surface Area: 44cm2 ?Total Energy: 93.81J ?Service Code: [phone] ? ?[name], RMA ? ?Documentation: I have reviewed the above documentation for accuracy and completeness, and I agree with the above. ? ?[name] MD  ? ?

## 2021-04-03 ENCOUNTER — Ambulatory Visit: Payer: Medicaid Other | Admitting: Family Medicine

## 2021-04-03 ENCOUNTER — Encounter: Payer: Self-pay | Admitting: Family Medicine

## 2021-04-03 VITALS — BP 102/63 | HR 75 | Temp 97.7°F | Ht 68.0 in | Wt 203.0 lb

## 2021-04-03 DIAGNOSIS — Z125 Encounter for screening for malignant neoplasm of prostate: Secondary | ICD-10-CM | POA: Diagnosis not present

## 2021-04-03 DIAGNOSIS — M79644 Pain in right finger(s): Secondary | ICD-10-CM

## 2021-04-03 DIAGNOSIS — S76012A Strain of muscle, fascia and tendon of left hip, initial encounter: Secondary | ICD-10-CM

## 2021-04-03 DIAGNOSIS — L400 Psoriasis vulgaris: Secondary | ICD-10-CM | POA: Diagnosis not present

## 2021-04-03 DIAGNOSIS — M2550 Pain in unspecified joint: Secondary | ICD-10-CM | POA: Diagnosis not present

## 2021-04-03 DIAGNOSIS — Z720 Tobacco use: Secondary | ICD-10-CM | POA: Diagnosis not present

## 2021-04-03 DIAGNOSIS — E7800 Pure hypercholesterolemia, unspecified: Secondary | ICD-10-CM

## 2021-04-03 DIAGNOSIS — Z0000 Encounter for general adult medical examination without abnormal findings: Secondary | ICD-10-CM

## 2021-04-03 DIAGNOSIS — R7303 Prediabetes: Secondary | ICD-10-CM | POA: Diagnosis not present

## 2021-04-03 DIAGNOSIS — L739 Follicular disorder, unspecified: Secondary | ICD-10-CM | POA: Diagnosis not present

## 2021-04-03 DIAGNOSIS — I10 Essential (primary) hypertension: Secondary | ICD-10-CM

## 2021-04-03 MED ORDER — MUPIROCIN CALCIUM 2 % EX CREA: 1.0000 "application " | TOPICAL_CREAM | Freq: Two times a day (BID) | CUTANEOUS | 1 refills | Status: DC

## 2021-04-03 NOTE — Assessment & Plan Note (Signed)
Remains precontemplative ?Advised importance of cessation ?

## 2021-04-03 NOTE — Assessment & Plan Note (Signed)
Being treated by dermatology ?He has previously failed Humira ?He was doing well on Cosentyx, but this was not approved by insurance, so they are trying some other things before they can resubmit PA ?As below, with this plus polyarthralgia, I do have concern for possible psoriatic arthritis ?

## 2021-04-03 NOTE — Assessment & Plan Note (Signed)
Well controlled Continue current medications Recheck metabolic panel F/u in 6 months  

## 2021-04-03 NOTE — Assessment & Plan Note (Addendum)
Concern for possible psoriatic arthritis given affected joints of his hands and other polyarthralgias in the setting of known psoriasis ?It does seem like it was better when he was on Cosentyx, but he is no longer taking this ?We will get x-rays of this finger today to ensure no acute injury ?We will also check CRP, ESR, anti-CCP, rheumatoid factor and refer to rheumatology for concern of possible psoriatic arthritis ?

## 2021-04-03 NOTE — Assessment & Plan Note (Signed)
Previously well controlled Continue statin Repeat FLP and CMP Goal LDL < 70 

## 2021-04-03 NOTE — Assessment & Plan Note (Signed)
New problem of his neck around his beard ?Treatment with mupirocin cream until resolution ?

## 2021-04-03 NOTE — Assessment & Plan Note (Signed)
Recommend low carb diet °Recheck A1c  °

## 2021-04-03 NOTE — Assessment & Plan Note (Signed)
New problem ?Continue medications as prescribed by pain management ?Advised rest, heat, avoiding twisting motion to exacerbated ?
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
## 2021-04-06 ENCOUNTER — Ambulatory Visit (INDEPENDENT_AMBULATORY_CARE_PROVIDER_SITE_OTHER): Payer: Medicaid Other | Admitting: Dermatology

## 2021-04-06 DIAGNOSIS — L400 Psoriasis vulgaris: Secondary | ICD-10-CM

## 2021-04-06 LAB — COMPREHENSIVE METABOLIC PANEL
ALT: 17 IU/L (ref 0–44)
AST: 21 IU/L (ref 0–40)
Albumin/Globulin Ratio: 1.6 (ref 1.2–2.2)
Albumin: 4.2 g/dL (ref 3.8–4.8)
Alkaline Phosphatase: 76 IU/L (ref 44–121)
BUN/Creatinine Ratio: 21 (ref 10–24)
BUN: 17 mg/dL (ref 8–27)
Bilirubin Total: 0.3 mg/dL (ref 0.0–1.2)
CO2: 24 mmol/L (ref 20–29)
Calcium: 9.4 mg/dL (ref 8.6–10.2)
Chloride: 95 mmol/L — ABNORMAL LOW (ref 96–106)
Creatinine, Ser: 0.82 mg/dL (ref 0.76–1.27)
Globulin, Total: 2.7 g/dL (ref 1.5–4.5)
Glucose: 143 mg/dL — ABNORMAL HIGH (ref 70–99)
Potassium: 4 mmol/L (ref 3.5–5.2)
Sodium: 137 mmol/L (ref 134–144)
Total Protein: 6.9 g/dL (ref 6.0–8.5)
eGFR: 99 mL/min/{1.73_m2} (ref >59)

## 2021-04-06 LAB — CBC WITH DIFFERENTIAL/PLATELET
Basophils Absolute: 0 10*3/uL (ref 0.0–0.2)
Basos: 0 %
EOS (ABSOLUTE): 0.2 10*3/uL (ref 0.0–0.4)
Eos: 2 %
Hematocrit: 41.4 % (ref 37.5–51.0)
Hemoglobin: 14.8 g/dL (ref 13.0–17.7)
Immature Grans (Abs): 0.1 10*3/uL (ref 0.0–0.1)
Immature Granulocytes: 1 %
Lymphocytes Absolute: 2.6 10*3/uL (ref 0.7–3.1)
Lymphs: 22 %
MCH: 31.8 pg (ref 26.6–33.0)
MCHC: 35.7 g/dL (ref 31.5–35.7)
MCV: 89 fL (ref 79–97)
Monocytes Absolute: 0.9 10*3/uL (ref 0.1–0.9)
Monocytes: 8 %
Neutrophils Absolute: 8.1 10*3/uL — ABNORMAL HIGH (ref 1.4–7.0)
Neutrophils: 67 %
Platelets: 261 10*3/uL (ref 150–450)
RBC: 4.66 x10E6/uL (ref 4.14–5.80)
RDW: 11.7 % (ref 11.6–15.4)
WBC: 11.9 10*3/uL — ABNORMAL HIGH (ref 3.4–10.8)

## 2021-04-06 LAB — LIPID PANEL WITH LDL/HDL RATIO
Cholesterol, Total: 112 mg/dL (ref 100–199)
HDL: 45 mg/dL (ref >39)
LDL Chol Calc (NIH): 38 mg/dL (ref 0–99)
LDL/HDL Ratio: 0.8 ratio (ref 0.0–3.6)
Triglycerides: 175 mg/dL — ABNORMAL HIGH (ref 0–149)
VLDL Cholesterol Cal: 29 mg/dL (ref 5–40)

## 2021-04-06 LAB — PSA: Prostate Specific Ag, Serum: 1.1 ng/mL (ref 0.0–4.0)

## 2021-04-06 LAB — RHEUMATOID FACTOR: Rheumatoid fact SerPl-aCnc: <10 IU/mL (ref <14.0)

## 2021-04-06 LAB — SEDIMENTATION RATE: Sed Rate: 6 mm/hr (ref 0–30)

## 2021-04-06 LAB — HEMOGLOBIN A1C
Est. average glucose Bld gHb Est-mCnc: 128 mg/dL
Hgb A1c MFr Bld: 6.1 % — ABNORMAL HIGH (ref 4.8–5.6)

## 2021-04-06 LAB — CYCLIC CITRUL PEPTIDE ANTIBODY, IGG/IGA: Cyclic Citrullin Peptide Ab: 4 units (ref 0–19)

## 2021-04-06 LAB — C-REACTIVE PROTEIN: CRP: 6 mg/L (ref 0–10)

## 2021-04-06 NOTE — Progress Notes (Signed)
Patient here today for xtrac treatment for Psoriasis Vulgaris. ? ?Total Surface Area: 52cm2 ?Total Energy: 127.45J ?Service Code: [phone] ? ?[name], RMA ? ?Documentation: I have reviewed the above documentation for accuracy and completeness, and I agree with the above. ? ?[name] MD  ? ?

## 2021-04-09 ENCOUNTER — Ambulatory Visit (INDEPENDENT_AMBULATORY_CARE_PROVIDER_SITE_OTHER): Payer: Medicaid Other | Admitting: Dermatology

## 2021-04-09 ENCOUNTER — Ambulatory Visit: Payer: Medicaid Other

## 2021-04-09 DIAGNOSIS — L400 Psoriasis vulgaris: Secondary | ICD-10-CM | POA: Diagnosis not present

## 2021-04-09 NOTE — Progress Notes (Signed)
Patient here today for xtrac treatment for psoriasis vulgaris.  ? ?Total Surface Area: 52cm2 ?Total Energy: 146.54J ?Service Code: [phone] ? ?[name], RMA ? ?Documentation: I have reviewed the above documentation for accuracy and completeness, and I agree with the above. ? ?[name] MD  ? ?

## 2021-04-13 ENCOUNTER — Ambulatory Visit (INDEPENDENT_AMBULATORY_CARE_PROVIDER_SITE_OTHER): Payer: Medicaid Other | Admitting: Dermatology

## 2021-04-13 ENCOUNTER — Other Ambulatory Visit: Payer: Self-pay | Admitting: Family Medicine

## 2021-04-13 DIAGNOSIS — L400 Psoriasis vulgaris: Secondary | ICD-10-CM

## 2021-04-13 NOTE — Progress Notes (Signed)
Patient here today for xtrac treatment to treat Psorasis Vulgaris.  ? ?Total Surface Area: 44cm2 ?Total Energy: 142.60J ?Service Code: [phone] ? ?[name], RMA  ?Documentation: I have reviewed the above documentation for accuracy and completeness, and I agree with the above. ? ?[name], MD ? ?

## 2021-04-14 ENCOUNTER — Ambulatory Visit
Admission: RE | Admit: 2021-04-14 | Discharge: 2021-04-14 | Disposition: A | Discharge status: Home / Self Care | Payer: Medicaid Other | Attending: Family Medicine | Admitting: Family Medicine

## 2021-04-14 ENCOUNTER — Ambulatory Visit
Admission: RE | Admit: 2021-04-14 | Discharge: 2021-04-14 | Disposition: A | Discharge status: Home / Self Care | Payer: Medicaid Other | Source: Ambulatory Visit | Attending: Family Medicine | Admitting: Family Medicine

## 2021-04-14 ENCOUNTER — Encounter: Payer: Self-pay | Admitting: Dermatology

## 2021-04-14 DIAGNOSIS — M79644 Pain in right finger(s): Secondary | ICD-10-CM | POA: Diagnosis not present

## 2021-04-14 DIAGNOSIS — M7989 Other specified soft tissue disorders: Secondary | ICD-10-CM | POA: Diagnosis not present

## 2021-04-15 ENCOUNTER — Telehealth: Payer: Self-pay | Admitting: Pain Medicine

## 2021-04-15 NOTE — Telephone Encounter (Signed)
Patient called asking if Dr. Dossie Arbour will do a peer to peer call so the patient can get his procedure done on his foot. He is having a lot of pain. Please advise patient ?

## 2021-04-16 ENCOUNTER — Ambulatory Visit (INDEPENDENT_AMBULATORY_CARE_PROVIDER_SITE_OTHER): Payer: Medicaid Other | Admitting: Dermatology

## 2021-04-16 ENCOUNTER — Other Ambulatory Visit: Payer: Self-pay | Admitting: Family Medicine

## 2021-04-16 DIAGNOSIS — L400 Psoriasis vulgaris: Secondary | ICD-10-CM | POA: Diagnosis not present

## 2021-04-16 DIAGNOSIS — M7918 Myalgia, other site: Secondary | ICD-10-CM

## 2021-04-16 NOTE — Telephone Encounter (Signed)
Caleb [name] can you ask him to do a peer to peer. He told [employer] not to set them up for him anymore. She is really have problems getting some patients authorized. This patient called in asking Dr. Dossie Arbour to do one so he can get injections in his foot.  ?

## 2021-04-16 NOTE — Progress Notes (Signed)
Patient here today for xtrac treatment to treat psoriasis vulgaris. ? ?Total Surface Area: 56cm2 ?Total Energy: 208.71J ?Service Code: [phone] ? ?[name], RMA ?Documentation: I have reviewed the above documentation for accuracy and completeness, and I agree with the above. ? ?[name], MD ? ? ?

## 2021-04-16 NOTE — Telephone Encounter (Signed)
Requested medications are due for refill today.  yes ? ?Requested medications are on the active medications list.  yes ? ?Last refill. 01/22/2021 #90 3 refills ? ?Future visit scheduled.   yes ? ?Notes to clinic.  Medication refills are not delegated. ? ? ? ?Requested Prescriptions  ?Pending Prescriptions Disp Refills  ? methocarbamol (ROBAXIN) 750 MG tablet [Pharmacy Med Name: METHOCARBAMOL 750 MG TABLET] 90 tablet 2  ?  Sig: TAKE 1 TABLET (750 MG TOTAL) BY MOUTH EVERY 8 (EIGHT) HOURS AS NEEDED FOR MUSCLE SPASMS.  ?  ? Not Delegated - Analgesics:  Muscle Relaxants Failed - 04/16/2021  2:20 PM  ?  ?  Failed - This refill cannot be delegated  ?  ?  Passed - Valid encounter within last 6 months  ?  Recent Outpatient Visits   ? ?      ? 1 week ago Encounter for annual physical exam  ? [facility] [place], Dionne Bucy, MD  ? 2 months ago Perforation of both tympanic membranes  ? [facility] Mins, Ria Comment, PA-C  ? 3 months ago Acute midline low back pain without sciatica  ? [place], DO  ? 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 [facility] Gwyneth Sprout, FNP  ? 7 months ago Essential hypertension  ? [facility] Bac[place]al[place], Dionne Bucy, MD  ? ?  ?  ?Future Appointments   ? ?        ? In 5 months Bacigalupo, Dionne Bucy, MD [facility]  ? In 6 months [place], Herbert Seta, MD [place]  ? ?  ? ?  ?  ?  ?  ?

## 2021-04-20 ENCOUNTER — Ambulatory Visit (INDEPENDENT_AMBULATORY_CARE_PROVIDER_SITE_OTHER): Payer: Medicaid Other | Admitting: Dermatology

## 2021-04-20 ENCOUNTER — Ambulatory Visit: Payer: Medicaid Other

## 2021-04-20 DIAGNOSIS — L400 Psoriasis vulgaris: Secondary | ICD-10-CM

## 2021-04-20 NOTE — Progress Notes (Signed)
Patient here today for Xtrac treatment of psoriasis vulgaris.  ? ?Total Surface Area: 56cm2 ?Total Energy: 240.02J ?Service Code: [phone] ? ?[name], RMA ? ?Documentation: I have reviewed the above documentation for accuracy and completeness, and I agree with the above. ? ?[name] MD  ? ?

## 2021-04-21 ENCOUNTER — Encounter: Payer: Self-pay | Admitting: Dermatology

## 2021-04-23 ENCOUNTER — Ambulatory Visit: Payer: Medicaid Other

## 2021-04-23 ENCOUNTER — Ambulatory Visit (INDEPENDENT_AMBULATORY_CARE_PROVIDER_SITE_OTHER): Payer: Medicaid Other | Admitting: Dermatology

## 2021-04-23 DIAGNOSIS — L400 Psoriasis vulgaris: Secondary | ICD-10-CM

## 2021-04-23 NOTE — Progress Notes (Signed)
Patient here today for xtrac treatment for psoriasis vulgaris.  ? ?Total Surface Area: 52cm2  ?Total Energy: 256.26J ?Service Code: [phone] ? ?[name], RMA ? ?Documentation: I have reviewed the above documentation for accuracy and completeness, and I agree with the above. ? ?[name] MD  ? ?

## 2021-04-24 ENCOUNTER — Telehealth: Payer: Self-pay | Admitting: Pain Medicine

## 2021-04-24 NOTE — Telephone Encounter (Signed)
Texted Dr. Dossie Arbour, Dr. Holley Raring, and Dr. Andree Elk. None is available today. Patient notified. He will call the requested pharmacy on Monday to make sure they still have them and let us know. ?

## 2021-04-27 ENCOUNTER — Other Ambulatory Visit: Payer: Self-pay | Admitting: *Deleted

## 2021-04-27 DIAGNOSIS — M542 Cervicalgia: Secondary | ICD-10-CM

## 2021-04-27 DIAGNOSIS — M47816 Spondylosis without myelopathy or radiculopathy, lumbar region: Secondary | ICD-10-CM

## 2021-04-27 DIAGNOSIS — Z79899 Other long term (current) drug therapy: Secondary | ICD-10-CM

## 2021-04-27 DIAGNOSIS — M5030 Other cervical disc degeneration, unspecified cervical region: Secondary | ICD-10-CM

## 2021-04-27 DIAGNOSIS — M5137 Other intervertebral disc degeneration, lumbosacral region: Secondary | ICD-10-CM

## 2021-04-27 DIAGNOSIS — M961 Postlaminectomy syndrome, not elsewhere classified: Secondary | ICD-10-CM

## 2021-04-27 DIAGNOSIS — Z79891 Long term (current) use of opiate analgesic: Secondary | ICD-10-CM

## 2021-04-27 DIAGNOSIS — G894 Chronic pain syndrome: Secondary | ICD-10-CM

## 2021-04-27 DIAGNOSIS — G8929 Other chronic pain: Secondary | ICD-10-CM

## 2021-04-27 DIAGNOSIS — M62838 Other muscle spasm: Secondary | ICD-10-CM

## 2021-04-27 DIAGNOSIS — M533 Sacrococcygeal disorders, not elsewhere classified: Secondary | ICD-10-CM

## 2021-04-27 DIAGNOSIS — M47812 Spondylosis without myelopathy or radiculopathy, cervical region: Secondary | ICD-10-CM

## 2021-04-27 MED ORDER — HYDROCODONE-ACETAMINOPHEN 7.5-325 MG PO TABS: 1.0000 | ORAL_TABLET | Freq: Four times a day (QID) | ORAL | 0 refills | Status: DC | PRN

## 2021-04-27 NOTE — Telephone Encounter (Signed)
Patient came to the office today. The pharmacy he requested on Friday does not have Hydrocodone. I asked him to call other pharmacies to find one that has it. He became angry and left.  ?

## 2021-04-28 ENCOUNTER — Ambulatory Visit (INDEPENDENT_AMBULATORY_CARE_PROVIDER_SITE_OTHER): Payer: Medicaid Other | Admitting: Dermatology

## 2021-04-28 DIAGNOSIS — L400 Psoriasis vulgaris: Secondary | ICD-10-CM | POA: Diagnosis not present

## 2021-04-28 NOTE — Progress Notes (Signed)
Patient here today for xtrac treatment of  psoraisis vulgaris.  ? ?Total Surface Area: 48cm2 ?Total Energy: 240.00J ?Service Code: [phone] ? ?[name], RMA ? ?Documentation: I have reviewed the above documentation for accuracy and completeness, and I agree with the above. ? ?[name] MD  ? ?

## 2021-04-30 ENCOUNTER — Ambulatory Visit (INDEPENDENT_AMBULATORY_CARE_PROVIDER_SITE_OTHER): Payer: Medicaid Other | Admitting: Dermatology

## 2021-04-30 DIAGNOSIS — L400 Psoriasis vulgaris: Secondary | ICD-10-CM

## 2021-04-30 NOTE — Progress Notes (Signed)
Patient here today for xtrac treatment of Psoriasis Vulgaris.  ? ?Total Surface Area: 52cm2 ?Total Energy: 260.00J ?Service Code: [phone] ? ?[name], RMA ? ?Documentation: I have reviewed the above documentation for accuracy and completeness, and I agree with the above. ? ?[name] MD  ? ?

## 2021-05-01 DIAGNOSIS — G4733 Obstructive sleep apnea (adult) (pediatric): Secondary | ICD-10-CM | POA: Diagnosis not present

## 2021-05-04 ENCOUNTER — Ambulatory Visit (INDEPENDENT_AMBULATORY_CARE_PROVIDER_SITE_OTHER): Payer: Medicaid Other | Admitting: Dermatology

## 2021-05-04 DIAGNOSIS — L400 Psoriasis vulgaris: Secondary | ICD-10-CM

## 2021-05-04 NOTE — Progress Notes (Signed)
Patient here today for xtrac treatment for psoriasis vulgaris.  ? ?Total Surface Area: 60cm2 ?Total Energy: 300.00J ?Service Code: [phone] ? ?[name], RMA ? ?Documentation: I have reviewed the above documentation for accuracy and completeness, and I agree with the above. ? ?[name] MD  ?

## 2021-05-05 ENCOUNTER — Other Ambulatory Visit: Payer: Self-pay | Admitting: Family Medicine

## 2021-05-05 NOTE — Telephone Encounter (Signed)
Requested Prescriptions  ?Pending Prescriptions Disp Refills  ?? isosorbide mononitrate (IMDUR) 30 MG 24 hr tablet [Pharmacy Med Name: ISOSORBIDE MONONIT ER 30 MG TB] 45 tablet 1  ?  Sig: TAKE 1/2 OF A TABLET (15 MG TOTAL) BY MOUTH DAILY  ?  ? Cardiovascular:  Nitrates Passed - 05/05/2021 10:51 AM  ?  ?  Passed - Last BP in normal range  ?  BP Readings from Last 1 Encounters:  ?04/03/21 102/63  ?   ?  ?  Passed - Last Heart Rate in normal range  ?  Pulse Readings from Last 1 Encounters:  ?04/03/21 75  ?   ?  ?  Passed - Valid encounter within last 12 months  ?  Recent Outpatient Visits   ?      ? 1 month ago Encounter for annual physical exam  ? [facility] [place], Dionne Bucy, MD  ? 3 months ago Perforation of both tympanic membranes  ? [facility] Mins, Ria Comment, PA-C  ? 3 months ago Acute midline low back pain without sciatica  ? [place], DO  ? 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 [facility] Gwyneth Sprout, FNP  ? 7 months ago Essential hypertension  ? [facility] Bacigalupo, Dionne Bucy, MD  ?  ?  ?Future Appointments   ?        ? Tomorrow [name], MD [place]  ? In 5 months Bacigalupo, Dionne Bucy, MD [facility]  ? In 5 months Diamantina [place], Herbert Seta, MD [place]  ?  ? ?  ?  ?  ? ? ?

## 2021-05-06 ENCOUNTER — Ambulatory Visit (INDEPENDENT_AMBULATORY_CARE_PROVIDER_SITE_OTHER): Payer: Medicaid Other | Admitting: Dermatology

## 2021-05-06 DIAGNOSIS — L409 Psoriasis, unspecified: Secondary | ICD-10-CM

## 2021-05-06 DIAGNOSIS — Z79899 Other long term (current) drug therapy: Secondary | ICD-10-CM | POA: Diagnosis not present

## 2021-05-06 DIAGNOSIS — L400 Psoriasis vulgaris: Secondary | ICD-10-CM | POA: Diagnosis not present

## 2021-05-06 NOTE — Progress Notes (Signed)
Patient here today for xtrac treatment of Psoriasis Vulgaris.  ?  ?Total Surface Area:52cm2 ?Total Energy: 260J ?Service code: [phone] ? ?[name] RMA ? ?Documentation: I have reviewed the above documentation for accuracy and completeness, and I agree with the above. ? ?[name] MD  ? ?

## 2021-05-06 NOTE — Progress Notes (Signed)
? ?Follow-Up Visit ?  ?Subjective  ?Caleb [name] is a 66 y.o. male who presents for the following: Follow-up. ? ?Patient here for 1 month follow-up psoriasis. He has finished loading dose of Cosentyx and will be starting maintenance dose today. Rx not covered by insurance, so patient is receiving samples in the office. He is also using Clobetasol cream and Clobetasol/CeraVe Cream mixture to areas of psoriasis daily. Xtrac laser is improving psoriasis on left lower leg. He was not able to tolerate Soriatane pills. He tried to take for a week, but mouth was extremely dry, lips cracked open, and he was not able to wear his BIPAP mask.  ? ? ?The following portions of the chart were reviewed this encounter and updated as appropriate:  ?  ?  ? ?Review of Systems:  No other skin or systemic complaints except as noted in HPI or Assessment and Plan. ? ?Objective  ?Well appearing patient in no apparent distress; mood and affect are within normal limits. ? ?A focused examination was performed including face, ears, legs. Relevant physical exam findings are noted in the Assessment and Plan. ? ?left lower leg, left ear ?Hyperpigmented violaceous patch with scale and thinning of the left lower leg; erythema and scale of the left ear; pink scaly papules on neck ? ? ? ?Assessment & Plan  ?Psoriasis ?left lower leg, left ear ? ?Chronic and persistent condition with duration or expected duration over one year. Condition is bothersome/symptomatic for patient.  He has been improving on Cosentyx samples, but insurance has denied covering it.  Patient was unable to tolerate Soriatane. ? ?Will send in appeal to insurance for Cosentyx coverage. ? ?Psoriasis - severe on systemic ?biologic? treatment injections.  Psoriasis is a chronic non-curable, but treatable genetic/hereditary disease that may have other systemic features affecting other organ systems such as joints (Psoriatic Arthritis).  It is linked with heart disease, inflammatory  bowel disease, non-alcoholic fatty liver disease, and depression. Significant skin psoriasis and/or psoriatic arthritis may have significant symptoms and affects activities of daily activity and often benefits from systemic ?biologic? injection treatments.  These ?biologic? treatments have some potential side effects including immunosuppression and require pre-treatment laboratory screening and periodic laboratory monitoring and periodic in person evaluation and monitoring by the attending dermatologist physician (long term medication management). ? ?Continue clobetasol/CeraVe mix 1-2 times daily prn for less severe areas. Avoid applying to face, groin, and axilla. Use as directed. Long-term use can cause thinning of the skin. ?  ?Continue clobetasol cream to more severe areas 1-2 times daily prn. Avoid applying to face, groin, and axilla. Use as directed. Long-term use can cause thinning of the skin. ?  ?Continue Cosentyx 150/55m x 2 q 4 weeks. He is starting maintenance dose today. Samples (150 mg x 2) given to patient. He will self inject at home. Lot # [id number] Exp: 07/2022 ?  ?Samples x 2 of Vtama Cream given to patient today. Apply to AA left ear once a day until improved. Lot [id number] 1/24. ? ?Continue Xtrac treatments. ? ? ?Related Medications ?clobetasol (TEMOVATE) 0.05 % external solution ?Apply 1 application topically See admin instructions. Mix with cerave jar cream use as directed in handout ? ?clobetasol cream (TEMOVATE) 0.05 % ?Apply 1 application topically 2 (two) times daily. Avoid applying to face, groin, and axilla. Use as directed. Long-term use can cause thinning of the skin. ? ? ?Return in about 4 weeks (around 06/03/2021) for Psoriasis. ? ?I, MJamesetta Orleans CMA, am acting  as scribe for [name], MD . ?Documentation: I have reviewed the above documentation for accuracy and completeness, and I agree with the above. ? ?[name] MD  ? ?

## 2021-05-06 NOTE — Patient Instructions (Addendum)
Start Vtama cream (samples) - apply to affected area left ear once a day until improved.  ? ?Reviewed risks of biologics including immunosuppression, infections, injection site reaction, and failure to improve condition. Goal is control of skin condition, not cure.  Some older biologics such as Humira and Enbrel may slightly increase risk of malignancy and may worsen congestive heart failure. The use of biologics requires long term medication management, including periodic office visits and monitoring of blood work. ? ?Topical steroids (such as triamcinolone, fluocinolone, fluocinonide, mometasone, clobetasol, halobetasol, betamethasone, hydrocortisone) can cause thinning and lightening of the skin if they are used for too long in the same area. Your physician has selected the right strength medicine for your problem and area affected on the body. Please use your medication only as directed by your physician to prevent side effects.  ? ?If You Need Anything After Your Visit ? ?If you have any questions or concerns for your doctor, please call our main line at [phone] and press option 4 to reach your doctor's medical assistant. If no one answers, please leave a voicemail as directed and we will return your call as soon as possible. Messages left after 4 pm will be answered the following business day.  ? ?You may also send [place] a message via MyChart. We typically respond to MyChart messages within 1-2 business days. ? ?For prescription refills, please ask your pharmacy to contact our office. Our fax number is [phone]. ? ?If you have an urgent issue when the clinic is closed that cannot wait until the next business day, you can page your doctor at the number below.   ? ?Please note that while we do our best to be available for urgent issues outside of office hours, we are not available 24/7.  ? ?If you have an urgent issue and are unable to reach [place], you may choose to seek medical care at your doctor's office,  retail clinic, urgent care center, or emergency room. ? ?If you have a medical emergency, please immediately call 911 or go to the emergency department. ? ?Pager Numbers ? ?- Dr. Nehemiah Massed: [phone] ? ?- Dr. Laurence Ferrari: [phone] ? ?- Dr. Nicole Kindred: [phone] ? ?In the event of inclement weather, please call our main line at [phone] for an update on the status of any delays or closures. ? ?Dermatology Medication Tips: ?Please keep the boxes that topical medications come in in order to help keep track of the instructions about where and how to use these. Pharmacies typically print the medication instructions only on the boxes and not directly on the medication tubes.  ? ?If your medication is too expensive, please contact our office at [phone] option 4 or send [place] a message through [place].  ? ?We are unable to tell what your co-pay for medications will be in advance as this is different depending on your insurance coverage. However, we may be able to find a substitute medication at lower cost or fill out paperwork to get insurance to cover a needed medication.  ? ?If a prior authorization is required to get your medication covered by your insurance company, please allow [place] 1-2 business days to complete this process. ? ?Drug prices often vary depending on where the prescription is filled and some pharmacies may offer cheaper prices. ? ?The website www.goodrx.com contains coupons for medications through different pharmacies. The prices here do not account for what the cost may be with help from insurance (it may be cheaper with your insurance), but the  website can give you the price if you did not use any insurance.  ?- You can print the associated coupon and take it with your prescription to the pharmacy.  ?- You may also stop by our office during regular business hours and pick up a GoodRx coupon card.  ?- If you need your prescription sent electronically to a different pharmacy, notify our office through  [facility] or by phone at [phone] option 4. ? ? ? ? ?Si Usted Necesita Algo Despu?s de Su Visita ? ?Tambi?n puede enviarnos un mensaje a trav?s de MyChart. Por lo general respondemos a los mensajes de MyChart en el transcurso de 1 a 2 d?as h?biles. ? ?Para renovar recetas, por favor pida a su farmacia que se ponga en contacto con nuestra oficina. Nuestro n?mero de fax es el [phone]. ? ?Si tiene un asunto urgente cuando la cl?nica est? cerrada y que no puede esperar hasta el siguiente d?a h?bil, puede llamar/localizar a su doctor(a) al n?mero que aparece a continuaci?n.  ? ?Por favor, tenga en cuenta que aunque hacemos todo lo posible para estar disponibles para asuntos urgentes fuera del horario de oficina, no estamos disponibles las 24 horas del d?a, los 7 d?as de la semana.  ? ?Si tiene un problema urgente y no puede comunicarse con nosotros, puede optar por buscar atenci?n m?dica  en el consultorio de su doctor(a), en una cl?nica privada, en un centro de atenci?n urgente o en una sala de emergencias. ? ?Si tiene Engineer, maintenance (IT) m?dica, por favor llame inmediatamente al 911 o vaya a la sala de emergencias. ? ?N?meros de b?per ? ?- Dr. Nehemiah Massed: [phone] ? ?- Dra. Moye: [phone] ? ?- Dra. Nicole Kindred: [phone] ? ?En caso de inclemencias del tiempo, por favor llame a nuestra l?nea principal al [phone] para una actualizaci?n sobre el estado de cualquier retraso o cierre. ? ?Consejos para la medicaci?n en dermatolog?a: ?Por favor, guarde las cajas en las que vienen los medicamentos de uso t?pico para ayudarle a seguir las instrucciones sobre d?nde y c?mo usarlos. Las farmacias generalmente imprimen las instrucciones del medicamento s?lo en las cajas y no directamente en los tubos del [place].  ? ?Si su medicamento es muy caro, por favor, p?ngase en contacto con Zigmund Daniel llamando al [phone] y presione la opci?n 4 o env?enos un mensaje a trav?s de MyChart.  ? ?No podemos  decirle cu?l ser? su copago por los medicamentos por adelantado ya que esto es diferente dependiendo de la cobertura de su seguro. Sin embargo, es posible que podamos encontrar un medicamento sustituto a Electrical engineer un formulario para que el seguro cubra el medicamento que se considera necesario.  ? ?Si se requiere Ardelia Mems autorizaci?n previa para que su compa??a de seguros [place] su medicamento, por favor perm?tanos de 1 a 2 d?as h?biles para completar este proceso. ? ?Los precios de los medicamentos var?an con frecuencia dependiendo del Environmental consultant de d?nde se surte la receta y alguna farmacias pueden ofrecer precios m?s baratos. ? ?El sitio web www.goodrx.com tiene cupones para medicamentos de Airline pilot. Los precios aqu? no tienen en cuenta lo que podr?a costar con la ayuda del seguro (puede ser m?s barato con su seguro), pero el sitio web puede darle el precio si no utiliz? ning?n seguro.  ?- Puede imprimir el cup?n correspondiente y llevarlo con su receta a la farmacia.  ?- Tambi?n puede pasar por nuestra oficina durante el horario de atenci?n regular y recoger una tarjeta de cupones de GoodRx.  ?-  Si necesita que su receta se env?e electr?nicamente a Chiropodist, informe a nuestra oficina a trav?s de MyChart de [place] o por tel?fono llamando al [phone] y presione la opci?n 4. ? ?

## 2021-05-07 NOTE — Progress Notes (Signed)
PROVIDER NOTE: Information contained herein reflects review and annotations entered in association with encounter. Interpretation of such information and data should be left to medically-trained personnel. Information provided to patient can be located elsewhere in the medical record under "Patient Instructions". Document created using STT-dictation technology, any transcriptional errors that may result from process are unintentional.  ?  ?Patient: Caleb [name]  Service Category: E/M  Provider: Gaspar Cola, MD  ?DOB: 1955-05-23  DOS: 05/11/2021  Specialty: Interventional Pain Management  ?MRN: [id number]  Setting: Ambulatory outpatient  PCP: Virginia Crews, MD  ?Type: Established Patient    Referring Provider: Virginia Crews, MD  ?Location: Office  Delivery: Face-to-face    ? ?HPI  ?Caleb [name], a 66 y.o. year old male, is here today because of his Chronic pain syndrome [G89.4]. Caleb [name] primary complain today is Foot Pain (Right ) and Neck Pain (Bilateral and traveling into shoulder blades ) ?Last encounter: My last encounter with him was on 04/24/2021. 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); Cervicalgia; DDD (degenerative disc disease), thoracic; Disorder of superficial peroneal nerve (Right); Chronic upper extremity pain (Left); Cervical spondylosis w/ radiculopathy; Cervical disc disorder with radiculopathy of cervical region; Chronic upper extremity weakness (Left); Abnormal nerve conduction studies (06/08/2017); 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); Spondylosis  without myelopathy or radiculopathy, cervical region; Cervical facet syndrome (Bilateral); Abnormal MRI, cervical spine (10/02/2019 & 08/13/2017); History of fusion of cervical spine; Chronic neck pain (4th area of Pain) (Bilateral) (L>R); Chronic neck pain with history of cervical spinal surgery; LLQ abdominal pain; Neuropathic pain; Nondisplaced fracture of proximal phalanx of left thumb with routine healing; 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, sequelae (Right); Cervical paraspinal muscle spasm; Trigger point of neck; Chronic hip pain (Bilateral); Sacroiliac joint pain (Left); Sacroiliac joint dysfunction (Left); Somatic dysfunction of sacroiliac joint (Left); Chronic low back pain (Bilateral) w/o sciatica; DDD (degenerative disc disease), lumbosacral; Failed back surgical syndrome; Neuropathy of right foot; Lumbar facet syndrome (Bilateral); Chronic sacroiliac joint pain (Bilateral); Lumbosacral facet arthropathy (L4-5, L5-S1); Spondylosis without myelopathy or radiculopathy, lumbosacral region; and Lateral epicondylitis of right elbow on their pertinent problem list. ?Pain Assessment: Severity of Chronic pain is reported as a 4 /10. Location: Neck (right foot) Left, Right/neck pain into shoulder blades. Onset: More than a month ago. Quality: Discomfort, Constant, Burning, Other (Comment) (feels like toes are going to blow off his foot). Timing: Constant. Modifying factor(s): medications help. ?Vitals:  height is '5\' 8"'$  (1.727 m) and weight is 200 lb (90.7 kg). His temporal temperature is 97.6 ?F (36.4 ?C). His blood pressure is 117/69 and his pulse is 69. His respiration is 18 and oxygen saturation is 98%.  ? ?Reason for encounter: medication management.   The patient indicates doing well with the current medication regimen. No adverse reactions or side effects reported to the medications. Today I just spent half an hour with the patient getting in here for one now he is pharmacy did not have enough medication to cover his  prescription and how he is insurance has denied the interventional treatments that usually keep his pain under control.  The combination of those two things has caused some significant increase in his  pain which is directly the cause of and the responsibility of his insurance company for denying treatment known to be effective in controlling his pain and also the fourth [facility] for not keeping adequate stocks of the medication that he uses on a regular basis. ? ?RTCB: 08/25/2021 ?Nonopioids transfer 11/21/2019: Lyrica and Robaxin ? ?Pharmacotherapy Assessment  ?Analgesic: Hydrocodone/APAP 5/325 1 tablet every 6 hours (20 mg/day of hydrocodone).  (Unable to tolerate an oxycodone IR trial due to stomachaches, nausea, headaches, and excessive somnolence.  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  ? ?Monitoring: ?[place] PMP: PDMP reviewed during this encounter.       ?Pharmacotherapy: No side-effects or adverse reactions reported. ?Compliance: No problems identified. ?Effectiveness: Clinically acceptable. ? ?Janett Billow, RN  05/11/2021  8:23 AM  Sign when Signing Visit ?Nursing Pain Medication Assessment:  ?Safety precautions to be maintained throughout the outpatient stay will include: orient to surroundings, keep bed in low position, maintain call bell within reach at all times, provide assistance with transfer out of bed and ambulation.  ?Medication Inspection Compliance: Pill count conducted under aseptic conditions, in front of the patient. Neither the pills nor the bottle was removed from the patient's sight at any time. Once count was completed pills were immediately returned to the patient in their original bottle. ? ?Medication: Hydrocodone/APAP ?Pill/Patch Count:  65 of 120 pills remain ?Pill/Patch Appearance: Markings consistent with prescribed medication ?Bottle Appearance: Standard pharmacy container. Clearly labeled. ?Filled Date: 04 / 24 / 2023 ?Last  Medication intake:  Today ?   UDS:  ?Summary  ?Date Value Ref Range Status  ?08/27/2020 Note  Final  ?  Comment:  ?  ==================================================================== ?ToxASSURE Select 13 (MW) ?==================================================================== ?Test                             Result       Flag       Units ? ?Drug Present and Declared for Prescription Verification ?  Hydrocodone                    1811         EXPECTED   ng/mg creat ?  Hydromorphone                  617          EXPECTED   ng/mg creat ?  Dihydrocodeine                 114          EXPECTED   ng/mg creat ?  Norhydrocodone                 1799         EXPECTED   ng/mg creat ?   Sources of hydrocodone include scheduled prescription medications. ?   Hydromorphone, dihydrocodeine and norhydrocodone are expected ?   metabolites of hydrocodone. Hydromorphone and dihydrocodeine are ?   also available as scheduled prescription medications. ? ?Drug Present not Declared for Prescription Verification ?  Oxazepam                       42           UNEXPECTED ng/mg creat ?  Temazepam                      41  UNEXPECTED ng/mg creat ?   Oxazepam and temazepam are expected metabolites of diazepam. ?   Oxazepam is also an expected metabolite of other benzodiazepine ?   drugs, including chlordiazepoxide, prazepam, clorazepate, halazepam, ?   and temazepam.  Oxazepam and temazepam are available as scheduled ?   prescription medications. ? ?==================================================================== ?Test                      Result    Flag   Units      Ref Range ?  Creatinine              76               mg/dL      >=20 ?==================================================================== ?Declared Medications: ? The flagging and interpretation on this report are based on the ? following declared medications.  Unexpected results may arise from ? inaccuracies in the declared medications. ? ? **Note: The testing  scope of this panel includes these medications: ? ? Hydrocodone (Norco) ? ? **Note: The testing scope of this panel does not include the ? following reported medications: ? ? Acetaminophen (Norco) ? Albuterol (V

## 2021-05-11 ENCOUNTER — Other Ambulatory Visit: Payer: Self-pay

## 2021-05-11 ENCOUNTER — Encounter: Payer: Self-pay | Admitting: Pain Medicine

## 2021-05-11 ENCOUNTER — Ambulatory Visit: Payer: Medicaid Other | Attending: Pain Medicine | Admitting: Pain Medicine

## 2021-05-11 ENCOUNTER — Ambulatory Visit: Payer: Medicaid Other | Admitting: Dermatology

## 2021-05-11 ENCOUNTER — Telehealth: Payer: Self-pay | Admitting: *Deleted

## 2021-05-11 VITALS — BP 117/69 | HR 69 | Temp 97.6°F | Resp 18 | Ht 68.0 in | Wt 200.0 lb

## 2021-05-11 DIAGNOSIS — M961 Postlaminectomy syndrome, not elsewhere classified: Secondary | ICD-10-CM | POA: Insufficient documentation

## 2021-05-11 DIAGNOSIS — Z79899 Other long term (current) drug therapy: Secondary | ICD-10-CM | POA: Insufficient documentation

## 2021-05-11 DIAGNOSIS — M546 Pain in thoracic spine: Secondary | ICD-10-CM | POA: Diagnosis not present

## 2021-05-11 DIAGNOSIS — M47812 Spondylosis without myelopathy or radiculopathy, cervical region: Secondary | ICD-10-CM | POA: Insufficient documentation

## 2021-05-11 DIAGNOSIS — M25571 Pain in right ankle and joints of right foot: Secondary | ICD-10-CM | POA: Insufficient documentation

## 2021-05-11 DIAGNOSIS — M533 Sacrococcygeal disorders, not elsewhere classified: Secondary | ICD-10-CM | POA: Diagnosis not present

## 2021-05-11 DIAGNOSIS — G894 Chronic pain syndrome: Secondary | ICD-10-CM | POA: Diagnosis not present

## 2021-05-11 DIAGNOSIS — M79671 Pain in right foot: Secondary | ICD-10-CM | POA: Diagnosis not present

## 2021-05-11 DIAGNOSIS — G8929 Other chronic pain: Secondary | ICD-10-CM | POA: Diagnosis not present

## 2021-05-11 DIAGNOSIS — M47816 Spondylosis without myelopathy or radiculopathy, lumbar region: Secondary | ICD-10-CM | POA: Diagnosis not present

## 2021-05-11 DIAGNOSIS — M79602 Pain in left arm: Secondary | ICD-10-CM | POA: Insufficient documentation

## 2021-05-11 DIAGNOSIS — M542 Cervicalgia: Secondary | ICD-10-CM | POA: Diagnosis not present

## 2021-05-11 DIAGNOSIS — L400 Psoriasis vulgaris: Secondary | ICD-10-CM | POA: Diagnosis not present

## 2021-05-11 DIAGNOSIS — Z79891 Long term (current) use of opiate analgesic: Secondary | ICD-10-CM | POA: Diagnosis not present

## 2021-05-11 DIAGNOSIS — M5137 Other intervertebral disc degeneration, lumbosacral region: Secondary | ICD-10-CM | POA: Diagnosis not present

## 2021-05-11 DIAGNOSIS — M5030 Other cervical disc degeneration, unspecified cervical region: Secondary | ICD-10-CM | POA: Diagnosis not present

## 2021-05-11 DIAGNOSIS — M62838 Other muscle spasm: Secondary | ICD-10-CM | POA: Insufficient documentation

## 2021-05-11 MED ORDER — HYDROCODONE-ACETAMINOPHEN 7.5-325 MG PO TABS: 1.0000 | ORAL_TABLET | Freq: Four times a day (QID) | ORAL | 0 refills | Status: DC | PRN

## 2021-05-11 NOTE — Progress Notes (Signed)
Patient here today for xtrac treatment for psoriasis vulgaris.  ? ?Total Surface Area: 60cm2 ?Total Energy: 300.00J ?Service Code: [phone] ? ?[name], RMA ? ?Documentation: I have reviewed the above documentation for accuracy and completeness, and I agree with the above. ? ?[name] MD  ? ?

## 2021-05-11 NOTE — Progress Notes (Signed)
Nursing Pain Medication Assessment:  ?Safety precautions to be maintained throughout the outpatient stay will include: orient to surroundings, keep bed in low position, maintain call bell within reach at all times, provide assistance with transfer out of bed and ambulation.  ?Medication Inspection Compliance: Pill count conducted under aseptic conditions, in front of the patient. Neither the pills nor the bottle was removed from the patient's sight at any time. Once count was completed pills were immediately returned to the patient in their original bottle. ? ?Medication: Hydrocodone/APAP ?Pill/Patch Count:  65 of 120 pills remain ?Pill/Patch Appearance: Markings consistent with prescribed medication ?Bottle Appearance: Standard pharmacy container. Clearly labeled. ?Filled Date: 04 / 24 / 2023 ?Last Medication intake:  Today ?

## 2021-05-11 NOTE — Telephone Encounter (Signed)
Called and [facility] the [facility].  Patient has changed his pharmacy d/t qty issues.  Pharmacy was changed but Rx were already in que [facility].  ?

## 2021-05-12 ENCOUNTER — Telehealth: Payer: Self-pay | Admitting: Pain Medicine

## 2021-05-12 DIAGNOSIS — M542 Cervicalgia: Secondary | ICD-10-CM | POA: Diagnosis not present

## 2021-05-12 DIAGNOSIS — M62838 Other muscle spasm: Secondary | ICD-10-CM | POA: Diagnosis not present

## 2021-05-12 NOTE — Telephone Encounter (Signed)
Pt stated that his right foot was in a lot of pain. Also Pt said it was getting hard to wear a shoe and walk. Please call Pt with some advice. ?

## 2021-05-14 ENCOUNTER — Ambulatory Visit: Payer: Medicaid Other | Admitting: Dermatology

## 2021-05-14 DIAGNOSIS — L400 Psoriasis vulgaris: Secondary | ICD-10-CM

## 2021-05-14 DIAGNOSIS — M62838 Other muscle spasm: Secondary | ICD-10-CM | POA: Diagnosis not present

## 2021-05-14 DIAGNOSIS — M542 Cervicalgia: Secondary | ICD-10-CM | POA: Diagnosis not present

## 2021-05-14 NOTE — Progress Notes (Signed)
Patient here today for xtrac treatment for psoriasis vulgaris.  ? ?Total Surface Area: 60cm2 ?Total Energy: 300J ?Service Code: [phone] ? ?[name], RMA ? ?Documentation: I have reviewed the above documentation for accuracy and completeness, and I agree with the above. ? ?[name] MD  ? ?

## 2021-05-18 ENCOUNTER — Ambulatory Visit: Payer: Medicaid Other | Admitting: Dermatology

## 2021-05-18 DIAGNOSIS — L400 Psoriasis vulgaris: Secondary | ICD-10-CM

## 2021-05-18 NOTE — Progress Notes (Signed)
Patient here today for xtrac treatment for psoriasis vulgaris.  ? ?Total Surface Area: 40cm2 ?Total Energy: 259m ?Service Code: [phone]? ?AJohnsie Kindred RMA ? ?Documentation: I have reviewed the above documentation for accuracy and completeness, and I agree with the above. ? ?TBrendolyn PattyMD  ? ?

## 2021-05-20 ENCOUNTER — Other Ambulatory Visit: Payer: Self-pay | Admitting: Adult Health

## 2021-05-20 DIAGNOSIS — M62838 Other muscle spasm: Secondary | ICD-10-CM | POA: Diagnosis not present

## 2021-05-20 DIAGNOSIS — M542 Cervicalgia: Secondary | ICD-10-CM | POA: Diagnosis not present

## 2021-05-21 ENCOUNTER — Other Ambulatory Visit: Payer: Self-pay

## 2021-05-21 ENCOUNTER — Ambulatory Visit: Payer: Medicaid Other | Admitting: Dermatology

## 2021-05-21 DIAGNOSIS — L400 Psoriasis vulgaris: Secondary | ICD-10-CM | POA: Diagnosis not present

## 2021-05-21 MED ORDER — ANORO ELLIPTA 62.5-25 MCG/ACT IN AEPB: INHALATION_SPRAY | RESPIRATORY_TRACT | 1 refills | Status: DC

## 2021-05-21 NOTE — Telephone Encounter (Signed)
Patient called and states that he would like a refill for the following:  Requested Prescriptions   Pending Prescriptions Disp Refills   umeclidinium-vilanterol (ANORO ELLIPTA) 62.5-25 MCG/ACT AEPB 60 each 1    Sig: INHALE 1 PUFF BY MOUTH EVERY DAY   [facility] [address] In [place]

## 2021-05-21 NOTE — Progress Notes (Signed)
Patient here today for xtrac treatment for psoriasis vulgaris.    Total Surface Area: 52cm2 Total Energy: 271m Service Code: [id number]  AJohnsie Kindred RMA  Documentation: I have reviewed the above documentation for accuracy and completeness, and I agree with the above.  TBrendolyn PattyMD

## 2021-05-21 NOTE — Telephone Encounter (Signed)
Requested medication (s) are due for refill today: yes  Requested medication (s) are on the active medication list: yes  Last refill:  12/03/20 #60/1  Future visit scheduled: yes  Notes to clinic:  Unable to refill per protocol, medication not assigned to the refill protocol.    Requested Prescriptions  Pending Prescriptions Disp Refills   umeclidinium-vilanterol (ANORO ELLIPTA) 62.5-25 MCG/ACT AEPB 60 each 1    Sig: INHALE 1 PUFF BY MOUTH EVERY DAY     There is no refill protocol information for this order

## 2021-05-25 ENCOUNTER — Ambulatory Visit: Payer: Medicaid Other | Admitting: Dermatology

## 2021-05-25 ENCOUNTER — Other Ambulatory Visit: Payer: Self-pay | Admitting: Family Medicine

## 2021-05-25 DIAGNOSIS — L400 Psoriasis vulgaris: Secondary | ICD-10-CM | POA: Diagnosis not present

## 2021-05-25 NOTE — Progress Notes (Signed)
Patient here today for xtrac treatment for psoriasis vulgaris.    Total Surface Area: 44cm2 Total Energy: 273m Service Code: [id number]  Caleb [name] RMA  Documentation: I have reviewed the above documentation for accuracy and completeness, and I agree with the above.  TBrendolyn PattyMD

## 2021-05-26 DIAGNOSIS — M542 Cervicalgia: Secondary | ICD-10-CM | POA: Diagnosis not present

## 2021-05-26 DIAGNOSIS — M62838 Other muscle spasm: Secondary | ICD-10-CM | POA: Diagnosis not present

## 2021-05-28 ENCOUNTER — Ambulatory Visit (INDEPENDENT_AMBULATORY_CARE_PROVIDER_SITE_OTHER): Payer: Medicaid Other | Admitting: Dermatology

## 2021-05-28 DIAGNOSIS — L400 Psoriasis vulgaris: Secondary | ICD-10-CM | POA: Diagnosis not present

## 2021-05-28 NOTE — Progress Notes (Signed)
Patient here today for xtrac treatment for psoriasis vulgaris.    Total Surface Area: 44cm2 Total Energy: 220.00 Service Code: [id number]   Caleb [name], RMA  Documentation: I have reviewed the above documentation for accuracy and completeness, and I agree with the above.  [name] MD

## 2021-05-29 DIAGNOSIS — M542 Cervicalgia: Secondary | ICD-10-CM | POA: Diagnosis not present

## 2021-05-29 DIAGNOSIS — M62838 Other muscle spasm: Secondary | ICD-10-CM | POA: Diagnosis not present

## 2021-05-31 DIAGNOSIS — G4733 Obstructive sleep apnea (adult) (pediatric): Secondary | ICD-10-CM | POA: Diagnosis not present

## 2021-06-02 DIAGNOSIS — M5412 Radiculopathy, cervical region: Secondary | ICD-10-CM | POA: Diagnosis not present

## 2021-06-02 DIAGNOSIS — Z6829 Body mass index (BMI) 29.0-29.9, adult: Secondary | ICD-10-CM | POA: Diagnosis not present

## 2021-06-04 ENCOUNTER — Other Ambulatory Visit: Payer: Self-pay | Admitting: Cardiovascular Disease

## 2021-06-04 ENCOUNTER — Ambulatory Visit: Payer: Medicaid Other | Admitting: Dermatology

## 2021-06-04 DIAGNOSIS — L400 Psoriasis vulgaris: Secondary | ICD-10-CM

## 2021-06-04 NOTE — Progress Notes (Signed)
Patient here today for xtrac treatment for psoriasis vulgaris.    Total Surface Area: 28cm2 Total Energy: 140.00J Service Code: [id number]   Caleb [name], RMA  Documentation: I have reviewed the above documentation for accuracy and completeness, and I agree with the above.  [name] MD

## 2021-06-05 DIAGNOSIS — M62838 Other muscle spasm: Secondary | ICD-10-CM | POA: Diagnosis not present

## 2021-06-05 DIAGNOSIS — M542 Cervicalgia: Secondary | ICD-10-CM | POA: Diagnosis not present

## 2021-06-09 ENCOUNTER — Ambulatory Visit (INDEPENDENT_AMBULATORY_CARE_PROVIDER_SITE_OTHER): Payer: Medicaid Other | Admitting: Dermatology

## 2021-06-09 ENCOUNTER — Ambulatory Visit: Payer: Medicaid Other | Admitting: Dermatology

## 2021-06-09 DIAGNOSIS — L400 Psoriasis vulgaris: Secondary | ICD-10-CM | POA: Diagnosis not present

## 2021-06-09 DIAGNOSIS — Z79899 Other long term (current) drug therapy: Secondary | ICD-10-CM | POA: Diagnosis not present

## 2021-06-09 DIAGNOSIS — L409 Psoriasis, unspecified: Secondary | ICD-10-CM | POA: Diagnosis not present

## 2021-06-09 NOTE — Progress Notes (Signed)
Patient here today for xtrac treatment for psoriasis vulgaris.    Total Surface Area: 56cm2 Total Energy: 280.0J Service Code: [id number]  Dicie Beam RMA  Documentation: I have reviewed the above documentation for accuracy and completeness, and I agree with the above.  [name] MD

## 2021-06-09 NOTE — Progress Notes (Signed)
   Follow-Up Visit   Subjective  Caleb [name] is a 66 y.o. male who presents for the following: Psoriasis (1 month follow up - Cosentyx injections monthly, Clobetasol cream and Clobetasol/Creave cream, Xtrac treatments 2 times per week - he did not tolerate Soriatane. He is still in appeals for Cosentyx injections so he is still receiving samples in the office.).   The following portions of the chart were reviewed this encounter and updated as appropriate:       Review of Systems:  No other skin or systemic complaints except as noted in HPI or Assessment and Plan.  Objective  Well appearing patient in no apparent distress; mood and affect are within normal limits.  A focused examination was performed including legs, ears. Relevant physical exam findings are noted in the Assessment and Plan.  Significant thinning with thin plaque of left lower leg. Scale of left ear.    Assessment & Plan  Psoriasis  Chronic and persistent condition with duration or expected duration over one year. Condition is symptomatic/ bothersome to patient. Improving on Cosentyx samples and XTRAC, but not currently at goal.   Psoriasis - severe on systemic "biologic" treatment injections.  Psoriasis is a chronic non-curable, but treatable genetic/hereditary disease that may have other systemic features affecting other organ systems such as joints (Psoriatic Arthritis).  It is linked with heart disease, inflammatory bowel disease, non-alcoholic fatty liver disease, and depression. Significant skin psoriasis and/or psoriatic arthritis may have significant symptoms and affects activities of daily activity and often benefits from systemic "biologic" injection treatments.  These "biologic" treatments have some potential side effects including immunosuppression and require pre-treatment laboratory screening and periodic laboratory monitoring and periodic in person evaluation and monitoring by the attending dermatologist  physician (long term medication management).   Continue Fluocinolone solution qd prn to ear, Vtama cream qd to left ear  Continue clobetasol/CeraVe mix 1-2 times daily prn for less severe areas. Avoid applying to face, groin, and axilla. Use as directed. Long-term use can cause thinning of the skin.   Continue clobetasol cream to more severe areas 1-2 times daily prn. Avoid applying to face, groin, and axilla. Use as directed. Long-term use can cause thinning of the skin.   Continue Cosentyx 150/23m x 2 q 4 weeks. He is starting maintenance dose today. Samples (150 mg x 2) given to patient. He will self inject at home. Lot # [id number]  Exp: 07/2021  Related Medications clobetasol (TEMOVATE) 0.05 % external solution Apply 1 application topically See admin instructions. Mix with cerave jar cream use as directed in handout  clobetasol cream (TEMOVATE) 08.25% Apply 1 application topically 2 (two) times daily. Avoid applying to face, groin, and axilla. Use as directed. Long-term use can cause thinning of the skin.   Return for 2 times per week for Xtrac with nurse, 3 months with Dr SNicole Kindred  I, HAshok Cordia CMA, am acting as scribe for TBrendolyn Patty MD .  Documentation: I have reviewed the above documentation for accuracy and completeness, and I agree with the above.  TBrendolyn PattyMD

## 2021-06-09 NOTE — Patient Instructions (Signed)
Due to recent changes in healthcare laws, you may see results of your pathology and/or laboratory studies on MyChart before the doctors have had a chance to review them. We understand that in some cases there may be results that are confusing or concerning to you. Please understand that not all results are received at the same time and often the doctors may need to interpret multiple results in order to provide you with the best plan of care or course of treatment. Therefore, we ask that you please give us 2 business days to thoroughly review all your results before contacting the office for clarification. Should we see a critical lab result, you will be contacted sooner.   If You Need Anything After Your Visit  If you have any questions or concerns for your doctor, please call our main line at [phone] and press option 4 to reach your doctor's medical assistant. If no one answers, please leave a voicemail as directed and we will return your call as soon as possible. Messages left after 4 pm will be answered the following business day.   You may also send us a message via MyChart. We typically respond to MyChart messages within 1-2 business days.  For prescription refills, please ask your pharmacy to contact our office. Our fax number is [phone].  If you have an urgent issue when the clinic is closed that cannot wait until the next business day, you can page your doctor at the number below.    Please note that while we do our best to be available for urgent issues outside of office hours, we are not available 24/7.   If you have an urgent issue and are unable to reach us, you may choose to seek medical care at your doctor's office, retail clinic, urgent care center, or emergency room.  If you have a medical emergency, please immediately call 911 or go to the emergency department.  Pager Numbers  - Dr. Kowalski: [phone]  - Dr. Moye: [phone]  - Dr. Stewart:  [phone]  In the event of inclement weather, please call our main line at [phone] for an update on the status of any delays or closures.  Dermatology Medication Tips: Please keep the boxes that topical medications come in in order to help keep track of the instructions about where and how to use these. Pharmacies typically print the medication instructions only on the boxes and not directly on the medication tubes.   If your medication is too expensive, please contact our office at [phone] option 4 or send us a message through MyChart.   We are unable to tell what your co-pay for medications will be in advance as this is different depending on your insurance coverage. However, we may be able to find a substitute medication at lower cost or fill out paperwork to get insurance to cover a needed medication.   If a prior authorization is required to get your medication covered by your insurance company, please allow us 1-2 business days to complete this process.  Drug prices often vary depending on where the prescription is filled and some pharmacies may offer cheaper prices.  The website www.goodrx.com contains coupons for medications through different pharmacies. The prices here do not account for what the cost may be with help from insurance (it may be cheaper with your insurance), but the website can give you the price if you did not use any insurance.  - You can print the associated coupon and take it with   your prescription to the pharmacy.  - You may also stop by our office during regular business hours and pick up a GoodRx coupon card.  - If you need your prescription sent electronically to a different pharmacy, notify our office through [facility] MyChart or by phone at [phone] option 4.     Si Usted Necesita Algo Despus de Su Visita  Tambin puede enviarnos un mensaje a travs de MyChart. Por lo general respondemos a los mensajes de MyChart en el transcurso de 1 a 2  das hbiles.  Para renovar recetas, por favor pida a su farmacia que se ponga en contacto con nuestra oficina. Nuestro nmero de fax es el [phone].  Si tiene un asunto urgente cuando la clnica est cerrada y que no puede esperar hasta el siguiente da hbil, puede llamar/localizar a su doctor(a) al nmero que aparece a continuacin.   Por favor, tenga en cuenta que aunque hacemos todo lo posible para estar disponibles para asuntos urgentes fuera del horario de oficina, no estamos disponibles las 24 horas del da, los 7 das de la semana.   Si tiene un problema urgente y no puede comunicarse con nosotros, puede optar por buscar atencin mdica  en el consultorio de su doctor(a), en una clnica privada, en un centro de atencin urgente o en una sala de emergencias.  Si tiene una emergencia mdica, por favor llame inmediatamente al 911 o vaya a la sala de emergencias.  Nmeros de bper  - Dr. Kowalski: [phone]  - Dra. Moye: [phone]  - Dra. Stewart: [phone]  En caso de inclemencias del tiempo, por favor llame a nuestra lnea principal al [phone] para una actualizacin sobre el estado de cualquier retraso o cierre.  Consejos para la medicacin en dermatologa: Por favor, guarde las cajas en las que vienen los medicamentos de uso tpico para ayudarle a seguir las instrucciones sobre dnde y cmo usarlos. Las farmacias generalmente imprimen las instrucciones del medicamento slo en las cajas y no directamente en los tubos del medicamento.   Si su medicamento es muy caro, por favor, pngase en contacto con nuestra oficina llamando al [phone] y presione la opcin 4 o envenos un mensaje a travs de MyChart.   No podemos decirle cul ser su copago por los medicamentos por adelantado ya que esto es diferente dependiendo de la cobertura de su seguro. Sin embargo, es posible que podamos encontrar un medicamento sustituto a menor costo o llenar un formulario para que el  seguro cubra el medicamento que se considera necesario.   Si se requiere una autorizacin previa para que su compaa de seguros cubra su medicamento, por favor permtanos de 1 a 2 das hbiles para completar este proceso.  Los precios de los medicamentos varan con frecuencia dependiendo del lugar de dnde se surte la receta y alguna farmacias pueden ofrecer precios ms baratos.  El sitio web www.goodrx.com tiene cupones para medicamentos de diferentes farmacias. Los precios aqu no tienen en cuenta lo que podra costar con la ayuda del seguro (puede ser ms barato con su seguro), pero el sitio web puede darle el precio si no utiliz ningn seguro.  - Puede imprimir el cupn correspondiente y llevarlo con su receta a la farmacia.  - Tambin puede pasar por nuestra oficina durante el horario de atencin regular y recoger una tarjeta de cupones de GoodRx.  - Si necesita que su receta se enve electrnicamente a una farmacia diferente, informe a nuestra oficina a travs de MyChart de [place]   o por telfono llamando al [phone] y presione la opcin 4.  

## 2021-06-10 DIAGNOSIS — M62838 Other muscle spasm: Secondary | ICD-10-CM | POA: Diagnosis not present

## 2021-06-10 DIAGNOSIS — M542 Cervicalgia: Secondary | ICD-10-CM | POA: Diagnosis not present

## 2021-06-11 ENCOUNTER — Ambulatory Visit: Payer: Medicaid Other | Admitting: Dermatology

## 2021-06-11 DIAGNOSIS — L400 Psoriasis vulgaris: Secondary | ICD-10-CM | POA: Diagnosis not present

## 2021-06-11 NOTE — Progress Notes (Signed)
Patient here today for xtrac treatment for psoriasis vulgaris.    Total Surface Area: 40cm2 Total Energy: 200.00J Service Code: 16109   Evorn Gong RMA  Documentation: I have reviewed the above documentation for accuracy and completeness, and I agree with the above.  Willeen Niece MD

## 2021-06-12 DIAGNOSIS — M542 Cervicalgia: Secondary | ICD-10-CM | POA: Diagnosis not present

## 2021-06-12 DIAGNOSIS — M62838 Other muscle spasm: Secondary | ICD-10-CM | POA: Diagnosis not present

## 2021-06-22 ENCOUNTER — Other Ambulatory Visit: Payer: Self-pay | Admitting: Family Medicine

## 2021-06-22 ENCOUNTER — Ambulatory Visit: Payer: Medicaid Other | Admitting: Dermatology

## 2021-06-22 DIAGNOSIS — M50123 Cervical disc disorder at C6-C7 level with radiculopathy: Secondary | ICD-10-CM | POA: Diagnosis not present

## 2021-06-22 DIAGNOSIS — M4802 Spinal stenosis, cervical region: Secondary | ICD-10-CM | POA: Diagnosis not present

## 2021-06-22 DIAGNOSIS — L400 Psoriasis vulgaris: Secondary | ICD-10-CM | POA: Diagnosis not present

## 2021-06-22 DIAGNOSIS — G894 Chronic pain syndrome: Secondary | ICD-10-CM

## 2021-06-22 DIAGNOSIS — M792 Neuralgia and neuritis, unspecified: Secondary | ICD-10-CM

## 2021-06-22 DIAGNOSIS — M9971 Connective tissue and disc stenosis of intervertebral foramina of cervical region: Secondary | ICD-10-CM | POA: Diagnosis not present

## 2021-06-22 DIAGNOSIS — M4722 Other spondylosis with radiculopathy, cervical region: Secondary | ICD-10-CM | POA: Diagnosis not present

## 2021-06-22 DIAGNOSIS — Z9889 Other specified postprocedural states: Secondary | ICD-10-CM | POA: Diagnosis not present

## 2021-06-22 DIAGNOSIS — Z981 Arthrodesis status: Secondary | ICD-10-CM | POA: Diagnosis not present

## 2021-06-22 DIAGNOSIS — S8411XS Injury of peroneal nerve at lower leg level, right leg, sequela: Secondary | ICD-10-CM

## 2021-06-22 NOTE — Progress Notes (Signed)
Patient here today for xtrac treatment for psoriasis vulgaris.    Total Surface Area: 16cm2 Total Energy: 80.00J Service Code: [id number]  Patient has had significant improvement with xtrac laser, but not clear.  Continue treatments.  [name], RMA  Documentation: I have reviewed the above documentation for accuracy and completeness, and I agree with the above.  [name] MD

## 2021-06-22 NOTE — Telephone Encounter (Signed)
NOV:10/05/2021

## 2021-06-25 ENCOUNTER — Ambulatory Visit: Payer: Medicaid Other | Admitting: Dermatology

## 2021-06-25 DIAGNOSIS — L400 Psoriasis vulgaris: Secondary | ICD-10-CM | POA: Diagnosis not present

## 2021-06-25 NOTE — Progress Notes (Unsigned)
Patient here today for xtrac treatment for psoriasis vulgaris.    Total Surface Area: 24cm2 Total Energy: 120.00J Service Code: 63785   [name], RMA

## 2021-06-29 ENCOUNTER — Ambulatory Visit: Payer: Medicaid Other | Admitting: Dermatology

## 2021-06-29 DIAGNOSIS — L400 Psoriasis vulgaris: Secondary | ICD-10-CM | POA: Diagnosis not present

## 2021-06-29 NOTE — Progress Notes (Signed)
Patient here today for xtrac treatment for psoriasis vulgaris.    Total Surface Area: 44cm2 Total Energy: 222.00J Service Code: 16109   Dorathy Daft, RMA  Documentation: I have reviewed the above documentation for accuracy and completeness, and I agree with the above.  Willeen Niece MD

## 2021-06-30 DIAGNOSIS — M62838 Other muscle spasm: Secondary | ICD-10-CM | POA: Diagnosis not present

## 2021-06-30 DIAGNOSIS — M542 Cervicalgia: Secondary | ICD-10-CM | POA: Diagnosis not present

## 2021-07-01 ENCOUNTER — Other Ambulatory Visit: Payer: Self-pay

## 2021-07-01 ENCOUNTER — Emergency Department
Admission: EM | Admit: 2021-07-01 | Discharge: 2021-07-01 | Disposition: A | Discharge status: Home / Self Care | Payer: Medicaid Other | Attending: Emergency Medicine | Admitting: Emergency Medicine

## 2021-07-01 ENCOUNTER — Emergency Department: Payer: Medicaid Other

## 2021-07-01 DIAGNOSIS — M545 Low back pain, unspecified: Secondary | ICD-10-CM | POA: Diagnosis not present

## 2021-07-01 DIAGNOSIS — M542 Cervicalgia: Secondary | ICD-10-CM | POA: Diagnosis not present

## 2021-07-01 DIAGNOSIS — J449 Chronic obstructive pulmonary disease, unspecified: Secondary | ICD-10-CM | POA: Diagnosis not present

## 2021-07-01 DIAGNOSIS — Y92410 Unspecified street and highway as the place of occurrence of the external cause: Secondary | ICD-10-CM | POA: Insufficient documentation

## 2021-07-01 DIAGNOSIS — I10 Essential (primary) hypertension: Secondary | ICD-10-CM | POA: Diagnosis not present

## 2021-07-01 MED ORDER — ACETAMINOPHEN 325 MG PO TABS
650.0000 mg | ORAL_TABLET | Freq: Once | ORAL | Status: AC
  Administered 2021-07-01: 650 mg via ORAL
  Filled 2021-07-01: qty 2

## 2021-07-01 MED ORDER — LIDOCAINE 5 % EX PTCH
1.0000 | MEDICATED_PATCH | CUTANEOUS | Status: DC
  Administered 2021-07-01: 1 via TRANSDERMAL
  Filled 2021-07-01: qty 1

## 2021-07-01 MED ORDER — KETOROLAC TROMETHAMINE 15 MG/ML IJ SOLN
15.0000 mg | Freq: Once | INTRAMUSCULAR | Status: AC
  Administered 2021-07-01: 15 mg via INTRAMUSCULAR
  Filled 2021-07-01: qty 1
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
## 2021-07-01 NOTE — Discharge Instructions (Signed)
Your CT scans did not show any acute injury.  Please return for any new, worsening, or change in symptoms or other concerns.  It was a pleasure caring for you today.
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
## 2021-07-02 ENCOUNTER — Ambulatory Visit: Payer: Medicaid Other

## 2021-07-02 DIAGNOSIS — M62838 Other muscle spasm: Secondary | ICD-10-CM | POA: Diagnosis not present

## 2021-07-02 DIAGNOSIS — M542 Cervicalgia: Secondary | ICD-10-CM | POA: Diagnosis not present

## 2021-07-06 ENCOUNTER — Ambulatory Visit (INDEPENDENT_AMBULATORY_CARE_PROVIDER_SITE_OTHER): Payer: Medicaid Other | Admitting: Physician Assistant

## 2021-07-06 ENCOUNTER — Encounter: Payer: Self-pay | Admitting: Physician Assistant

## 2021-07-06 VITALS — BP 96/63 | HR 87 | Temp 98.1°F | Resp 16 | Wt 196.1 lb

## 2021-07-06 DIAGNOSIS — M542 Cervicalgia: Secondary | ICD-10-CM | POA: Diagnosis not present

## 2021-07-06 DIAGNOSIS — M533 Sacrococcygeal disorders, not elsewhere classified: Secondary | ICD-10-CM | POA: Diagnosis not present

## 2021-07-06 DIAGNOSIS — G5791 Unspecified mononeuropathy of right lower limb: Secondary | ICD-10-CM

## 2021-07-06 DIAGNOSIS — Z79891 Long term (current) use of opiate analgesic: Secondary | ICD-10-CM | POA: Diagnosis not present

## 2021-07-06 DIAGNOSIS — M79671 Pain in right foot: Secondary | ICD-10-CM | POA: Diagnosis not present

## 2021-07-06 DIAGNOSIS — G8929 Other chronic pain: Secondary | ICD-10-CM

## 2021-07-06 DIAGNOSIS — M62838 Other muscle spasm: Secondary | ICD-10-CM | POA: Diagnosis not present

## 2021-07-06 DIAGNOSIS — M545 Low back pain, unspecified: Secondary | ICD-10-CM
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
## 2021-07-08 ENCOUNTER — Telehealth: Payer: Self-pay | Admitting: Pain Medicine
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
## 2021-07-09 ENCOUNTER — Encounter: Payer: Self-pay | Admitting: Pain Medicine

## 2021-07-09 ENCOUNTER — Ambulatory Visit (INDEPENDENT_AMBULATORY_CARE_PROVIDER_SITE_OTHER): Payer: Medicaid Other | Admitting: Dermatology

## 2021-07-09 ENCOUNTER — Ambulatory Visit: Payer: Medicaid Other | Attending: Pain Medicine | Admitting: Pain Medicine

## 2021-07-09 VITALS — BP 143/82 | HR 69 | Temp 98.6°F | Ht 68.0 in | Wt 196.0 lb

## 2021-07-09 DIAGNOSIS — G894 Chronic pain syndrome: Secondary | ICD-10-CM | POA: Insufficient documentation

## 2021-07-09 DIAGNOSIS — F17210 Nicotine dependence, cigarettes, uncomplicated: Secondary | ICD-10-CM | POA: Insufficient documentation

## 2021-07-09 DIAGNOSIS — M542 Cervicalgia: Secondary | ICD-10-CM | POA: Diagnosis not present

## 2021-07-09 DIAGNOSIS — Z8249 Family history of ischemic heart disease and other diseases of the circulatory system: Secondary | ICD-10-CM | POA: Insufficient documentation

## 2021-07-09 DIAGNOSIS — M545 Low back pain, unspecified: Secondary | ICD-10-CM | POA: Diagnosis not present

## 2021-07-09 DIAGNOSIS — G8929 Other chronic pain: Secondary | ICD-10-CM

## 2021-07-09 DIAGNOSIS — I2510 Atherosclerotic heart disease of native coronary artery without angina pectoris: Secondary | ICD-10-CM | POA: Diagnosis not present

## 2021-07-09 DIAGNOSIS — L400 Psoriasis vulgaris: Secondary | ICD-10-CM | POA: Diagnosis not present

## 2021-07-09 DIAGNOSIS — I700 Atherosclerosis of aorta: Secondary | ICD-10-CM | POA: Diagnosis not present

## 2021-07-09 MED ORDER — ORPHENADRINE CITRATE 30 MG/ML IJ SOLN
60.0000 mg | Freq: Once | INTRAMUSCULAR | Status: AC
  Administered 2021-07-09: 60 mg via INTRAMUSCULAR
  Filled 2021-07-09: qty 2

## 2021-07-09 MED ORDER — PREDNISONE 20 MG PO TABS: ORAL_TABLET | ORAL | 0 refills | Status: AC

## 2021-07-09 MED ORDER — KETOROLAC TROMETHAMINE 60 MG/2ML IM SOLN
60.0000 mg | Freq: Once | INTRAMUSCULAR | Status: AC
  Administered 2021-07-09: 60 mg via INTRAMUSCULAR
  Filled 2021-07-09: qty 2

## 2021-07-09 NOTE — Progress Notes (Signed)
Safety precautions to be maintained throughout the outpatient stay will include: orient to surroundings, keep bed in low position, maintain call bell within reach at all times, provide assistance with transfer out of bed and ambulation.  

## 2021-07-09 NOTE — Progress Notes (Signed)
Patient here today for xtrac treatment for psoriasis vulgaris.    Total Surface Area: 16cm2 Total Energy: 80.00J Service Code: [place] RMA  Documentation: I have reviewed the above documentation for accuracy and completeness, and I agree with the above.  [name] MD

## 2021-07-14 ENCOUNTER — Ambulatory Visit: Payer: Medicaid Other

## 2021-07-14 DIAGNOSIS — Z6829 Body mass index (BMI) 29.0-29.9, adult: Secondary | ICD-10-CM | POA: Diagnosis not present

## 2021-07-14 DIAGNOSIS — M542 Cervicalgia: Secondary | ICD-10-CM | POA: Diagnosis not present

## 2021-07-16 ENCOUNTER — Ambulatory Visit: Payer: Medicaid Other

## 2021-07-16 ENCOUNTER — Telehealth: Payer: Self-pay | Admitting: Pain Medicine

## 2021-07-16 NOTE — Telephone Encounter (Signed)
Pt stated he is pain and that he went to the appt that Dr. Dossie Arbour referral him to. Patient wants to see what next. Please give patient a call. Thanks

## 2021-07-16 NOTE — Telephone Encounter (Signed)
He needs to be scheduled an appointment please.

## 2021-07-20 ENCOUNTER — Telehealth: Payer: Self-pay | Admitting: Family Medicine

## 2021-07-20 ENCOUNTER — Ambulatory Visit: Payer: Medicaid Other | Admitting: Dermatology

## 2021-07-20 DIAGNOSIS — Z720 Tobacco use: Secondary | ICD-10-CM

## 2021-07-20 DIAGNOSIS — L400 Psoriasis vulgaris: Secondary | ICD-10-CM

## 2021-07-20 NOTE — Telephone Encounter (Signed)
Patient requesting a rx for chantix to help him stop smoking  Patient was last seen 07-06-2021  Please fu w/ patient  Pharmacy  [id number] [place]  [phone] Fax:  [phone]

## 2021-07-20 NOTE — Progress Notes (Signed)
Patient here today for xtrac treatment for psoriasis vulgaris.    Total Surface Area: 20cm2 Total Energy: 100.00J Service Code: 02984   Caleb [name], RMA Documentation: I have reviewed the above documentation for accuracy and completeness, and I agree with the above.  [name], MD

## 2021-07-21 MED ORDER — VARENICLINE TARTRATE 1 MG PO TABS: ORAL_TABLET | ORAL | 0 refills | Status: DC

## 2021-07-21 NOTE — Telephone Encounter (Signed)
Caleb [name] from the pharmacy called for Rx instruction clarification. Please advise

## 2021-07-21 NOTE — Telephone Encounter (Signed)
Patient advise.  

## 2021-07-21 NOTE — Telephone Encounter (Signed)
Please, let pt know that chantix was sent to his pharmacy. Instruction for taking this med: Start 1/2 tab PO qd x 3 days, then 1/2 tab PO bid x 4 days, with food, start drug 1 wk before quit date if quit date planned, stop smoking 8-35 days after starting drug if quit date unplanned, for 12 wks/initial tx.

## 2021-07-22 NOTE — Telephone Encounter (Signed)
Rx instructions clarified with pharmacist.  They are to dispense 1 mo starter pack and will need a new Rx after 1 mo.

## 2021-07-23 ENCOUNTER — Ambulatory Visit (INDEPENDENT_AMBULATORY_CARE_PROVIDER_SITE_OTHER): Payer: Medicaid Other

## 2021-07-23 ENCOUNTER — Ambulatory Visit: Payer: Medicaid Other | Admitting: Podiatry

## 2021-07-23 ENCOUNTER — Ambulatory Visit: Payer: Medicaid Other | Admitting: Dermatology

## 2021-07-23 DIAGNOSIS — M778 Other enthesopathies, not elsewhere classified: Secondary | ICD-10-CM

## 2021-07-23 DIAGNOSIS — L400 Psoriasis vulgaris: Secondary | ICD-10-CM | POA: Diagnosis not present

## 2021-07-23 NOTE — Progress Notes (Signed)
Patient here today for xtrac treatment for psoriasis vulgaris.    Total Surface Area: 12cm2 Total Energy: 60.00J Service Code: 95747   Dicie Beam RMA Documentation: I have reviewed the above documentation for accuracy and completeness, and I agree with the above.  [name], MD
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
## 2021-07-27 ENCOUNTER — Ambulatory Visit: Payer: Medicaid Other | Attending: Pain Medicine | Admitting: Pain Medicine

## 2021-07-27 ENCOUNTER — Encounter: Payer: Self-pay | Admitting: Pain Medicine

## 2021-07-27 VITALS — BP 126/86 | HR 67 | Temp 98.1°F | Ht 68.0 in | Wt 196.0 lb

## 2021-07-27 DIAGNOSIS — G894 Chronic pain syndrome: Secondary | ICD-10-CM | POA: Insufficient documentation

## 2021-07-27 DIAGNOSIS — M25571 Pain in right ankle and joints of right foot: Secondary | ICD-10-CM | POA: Diagnosis not present

## 2021-07-27 DIAGNOSIS — M542 Cervicalgia: Secondary | ICD-10-CM | POA: Insufficient documentation

## 2021-07-27 DIAGNOSIS — M546 Pain in thoracic spine: Secondary | ICD-10-CM | POA: Insufficient documentation

## 2021-07-27 DIAGNOSIS — M79671 Pain in right foot: Secondary | ICD-10-CM | POA: Insufficient documentation

## 2021-07-27 DIAGNOSIS — M79602 Pain in left arm: Secondary | ICD-10-CM | POA: Insufficient documentation

## 2021-07-27 DIAGNOSIS — S32009S Unspecified fracture of unspecified lumbar vertebra, sequela: Secondary | ICD-10-CM | POA: Insufficient documentation

## 2021-07-27 DIAGNOSIS — M533 Sacrococcygeal disorders, not elsewhere classified: Secondary | ICD-10-CM | POA: Diagnosis not present

## 2021-07-27 DIAGNOSIS — M47816 Spondylosis without myelopathy or radiculopathy, lumbar region: Secondary | ICD-10-CM | POA: Insufficient documentation

## 2021-07-27 DIAGNOSIS — G8929 Other chronic pain: Secondary | ICD-10-CM | POA: Diagnosis not present

## 2021-07-27 DIAGNOSIS — M62838 Other muscle spasm: Secondary | ICD-10-CM | POA: Insufficient documentation

## 2021-07-27 DIAGNOSIS — M5030 Other cervical disc degeneration, unspecified cervical region: Secondary | ICD-10-CM | POA: Diagnosis not present

## 2021-07-27 DIAGNOSIS — M47812 Spondylosis without myelopathy or radiculopathy, cervical region: Secondary | ICD-10-CM | POA: Diagnosis not present

## 2021-07-27 DIAGNOSIS — Z79891 Long term (current) use of opiate analgesic: Secondary | ICD-10-CM | POA: Diagnosis not present

## 2021-07-27 DIAGNOSIS — M5137 Other intervertebral disc degeneration, lumbosacral region: Secondary | ICD-10-CM | POA: Diagnosis not present

## 2021-07-27 DIAGNOSIS — Z79899 Other long term (current) drug therapy: Secondary | ICD-10-CM | POA: Insufficient documentation

## 2021-07-27 DIAGNOSIS — M961 Postlaminectomy syndrome, not elsewhere classified: Secondary | ICD-10-CM | POA: Diagnosis not present

## 2021-07-27 MED ORDER — HYDROCODONE-ACETAMINOPHEN 7.5-325 MG PO TABS: 1.0000 | ORAL_TABLET | Freq: Four times a day (QID) | ORAL | 0 refills | Status: DC | PRN

## 2021-07-27 NOTE — Progress Notes (Signed)
Nursing Pain Medication Assessment:  Safety precautions to be maintained throughout the outpatient stay will include: orient to surroundings, keep bed in low position, maintain call bell within reach at all times, provide assistance with transfer out of bed and ambulation.  Medication Inspection Compliance: Pill count conducted under aseptic conditions, in front of the patient. Neither the pills nor the bottle was removed from the patient's sight at any time. Once count was completed pills were immediately returned to the patient in their original bottle.  Medication: Hydrocodone/APAP Pill/Patch Count:  4 of 120 pills remain Pill/Patch Appearance: Markings consistent with prescribed medication Bottle Appearance: Standard pharmacy container. Clearly labeled. Filled Date: 6 / 24 / 2023 Last Medication intake:  TodaySafety precautions to be maintained throughout the outpatient stay will include: orient to surroundings, keep bed in low position, maintain call bell within reach at all times, provide assistance with transfer out of bed and ambulation.

## 2021-07-27 NOTE — Patient Instructions (Signed)

## 2021-07-28 ENCOUNTER — Ambulatory Visit: Payer: Medicaid Other

## 2021-07-28 ENCOUNTER — Telehealth: Payer: Self-pay | Admitting: *Deleted

## 2021-07-28 NOTE — Telephone Encounter (Signed)
Please advise 

## 2021-07-28 NOTE — Telephone Encounter (Signed)
Copied from [place] [id number]. Topic: General - Other >> Jul 28, 2021  8:38 AM [name] wrote: Pt is requesting to be sent for an MRI on his back. He states he went to the pain management clinic on 7/24 and they told him they did not see any fractures on his CT. Pt states he is still in pain. Please follow up with pt.

## 2021-07-29 ENCOUNTER — Ambulatory Visit: Payer: Medicaid Other | Admitting: Pain Medicine

## 2021-07-30 ENCOUNTER — Ambulatory Visit: Payer: Medicaid Other | Admitting: Dermatology

## 2021-07-30 DIAGNOSIS — L400 Psoriasis vulgaris: Secondary | ICD-10-CM

## 2021-07-30 LAB — TOXASSURE SELECT 13 (MW), URINE

## 2021-07-30 NOTE — Progress Notes (Signed)
Patient here today for xtrac treatment for psoriasis vulgaris.  Dosing was decreased -15% due to heat from previous treatments.   Total Surface Area: 12cm2 Total Energy: 50.99J Service Code: [id number]   [name], RMA  Documentation: I have reviewed the above documentation for accuracy and completeness, and I agree with the above.  [name] MD

## 2021-07-31 ENCOUNTER — Encounter: Payer: Self-pay | Admitting: Dermatology

## 2021-08-01 ENCOUNTER — Encounter: Payer: Self-pay | Admitting: Dermatology

## 2021-08-03 ENCOUNTER — Ambulatory Visit: Payer: Medicaid Other | Admitting: Dermatology

## 2021-08-03 DIAGNOSIS — L400 Psoriasis vulgaris: Secondary | ICD-10-CM

## 2021-08-03 NOTE — Progress Notes (Signed)
Patient here today for xtrac treatment for psoriasis vulgaris.    Total Surface Area: 16cm2 Total Energy: 67.98J Service Code: [id number]   [name], RMA  Documentation: I have reviewed the above documentation for accuracy and completeness, and I agree with the above.  [name] MD

## 2021-08-06 ENCOUNTER — Ambulatory Visit: Payer: Medicaid Other

## 2021-08-10 ENCOUNTER — Ambulatory Visit: Payer: Medicaid Other | Admitting: Dermatology

## 2021-08-10 DIAGNOSIS — L400 Psoriasis vulgaris: Secondary | ICD-10-CM | POA: Diagnosis not present

## 2021-08-10 NOTE — Progress Notes (Signed)
Patient here today for xtrac treatment for psoriasis vulgaris.    Total Surface Area: 8cm2 Total Energy: 33.99J Service Code: [id number]  Patient also given sample pack of Cosentyx '150mg'$ /50m x 2. He will self inject at home. Lot# [id number]:07/2022  ADicie BeamRMA  Documentation: I have reviewed the above documentation for accuracy and completeness, and I agree with the above.  TBrendolyn PattyMD

## 2021-08-11 ENCOUNTER — Other Ambulatory Visit: Payer: Self-pay | Admitting: Family Medicine

## 2021-08-12 ENCOUNTER — Encounter: Payer: Medicaid Other | Admitting: Pain Medicine

## 2021-08-13 ENCOUNTER — Ambulatory Visit: Payer: Medicaid Other

## 2021-08-17 ENCOUNTER — Other Ambulatory Visit: Payer: Self-pay | Admitting: Physician Assistant

## 2021-08-17 ENCOUNTER — Ambulatory Visit: Payer: Medicaid Other | Admitting: Dermatology

## 2021-08-17 DIAGNOSIS — L400 Psoriasis vulgaris: Secondary | ICD-10-CM | POA: Diagnosis not present

## 2021-08-17 DIAGNOSIS — Z720 Tobacco use: Secondary | ICD-10-CM

## 2021-08-17 MED ORDER — VARENICLINE TARTRATE 1 MG PO TABS: 1.0000 mg | ORAL_TABLET | Freq: Two times a day (BID) | ORAL | 2 refills | Status: DC

## 2021-08-17 NOTE — Progress Notes (Signed)
Patient here today for xtrac treatment for psoriasis vulgaris.    Total Surface Area: 12cm2 Total Energy: 50.99J Service Code: [id number]    Dicie Beam RMA  Documentation: I have reviewed the above documentation for accuracy and completeness, and I agree with the above.  [name] MD

## 2021-08-17 NOTE — Telephone Encounter (Signed)
Pt states he is using and it's helping a lot and down to .5 ppd. He needs a refill sometime this week.

## 2021-08-17 NOTE — Progress Notes (Unsigned)
Started slow due to reported previous side effects

## 2021-08-18 NOTE — Telephone Encounter (Signed)
Pt informed

## 2021-08-20 ENCOUNTER — Ambulatory Visit: Payer: Medicaid Other | Admitting: Dermatology

## 2021-08-20 DIAGNOSIS — L400 Psoriasis vulgaris: Secondary | ICD-10-CM | POA: Diagnosis not present

## 2021-08-20 NOTE — Progress Notes (Signed)
Patient here today for xtrac treatment for psoriasis vulgaris.    Total Surface Area: 12cm2 Total Energy: 50.99J Service Code: [id number]     Dicie Beam RMA  Documentation: I have reviewed the above documentation for accuracy and completeness, and I agree with the above.  [name] MD

## 2021-08-24 ENCOUNTER — Ambulatory Visit: Payer: Medicaid Other | Admitting: Dermatology

## 2021-08-24 DIAGNOSIS — L400 Psoriasis vulgaris: Secondary | ICD-10-CM

## 2021-08-24 NOTE — Progress Notes (Signed)
Patient here today for xtrac treatment for psoriasis vulgaris.    Total Surface Area: 16cm2 Total Energy: 67.98J Service Code: [place] RMA  Documentation: I have reviewed the above documentation for accuracy and completeness, and I agree with the above.  [name] MD

## 2021-08-25 ENCOUNTER — Ambulatory Visit: Payer: Medicaid Other | Admitting: Dermatology

## 2021-08-25 DIAGNOSIS — L409 Psoriasis, unspecified: Secondary | ICD-10-CM | POA: Diagnosis not present

## 2021-08-25 DIAGNOSIS — L4050 Arthropathic psoriasis, unspecified: Secondary | ICD-10-CM | POA: Insufficient documentation

## 2021-08-25 DIAGNOSIS — L400 Psoriasis vulgaris: Secondary | ICD-10-CM | POA: Diagnosis not present

## 2021-08-25 DIAGNOSIS — Z79899 Other long term (current) drug therapy: Secondary | ICD-10-CM

## 2021-08-25 DIAGNOSIS — Z7960 Long term (current) use of unspecified immunomodulators and immunosuppressants: Secondary | ICD-10-CM | POA: Insufficient documentation

## 2021-08-25 DIAGNOSIS — M159 Polyosteoarthritis, unspecified: Secondary | ICD-10-CM | POA: Insufficient documentation

## 2021-08-25 DIAGNOSIS — M5030 Other cervical disc degeneration, unspecified cervical region: Secondary | ICD-10-CM | POA: Diagnosis not present

## 2021-08-25 NOTE — Patient Instructions (Addendum)
Fluocinolone solution - Apply a couple of drops to ears twice daily until improved. Zoryve Cream - Apply to ears twice a day until improved. Samples given.  Reviewed risks of biologics including immunosuppression, infections, injection site reaction, and failure to improve condition. Goal is control of skin condition, not cure.  Some older biologics such as Humira and Enbrel may slightly increase risk of malignancy and may worsen congestive heart failure.  Talz and Cosentyx may cause inflammatory bowel disease to flare. The use of biologics requires long term medication management, including periodic office visits and monitoring of blood work.  Topical steroids (such as triamcinolone, fluocinolone, fluocinonide, mometasone, clobetasol, halobetasol, betamethasone, hydrocortisone) can cause thinning and lightening of the skin if they are used for too long in the same area. Your physician has selected the right strength medicine for your problem and area affected on the body. Please use your medication only as directed by your physician to prevent side effects.    Due to recent changes in healthcare laws, you may see results of your pathology and/or laboratory studies on MyChart before the doctors have had a chance to review them. We understand that in some cases there may be results that are confusing or concerning to you. Please understand that not all results are received at the same time and often the doctors may need to interpret multiple results in order to provide you with the best plan of care or course of treatment. Therefore, we ask that you please give [place] 2 business days to thoroughly review all your results before contacting the office for clarification. Should we see a critical lab result, you will be contacted sooner.   If You Need Anything After Your Visit  If you have any questions or concerns for your doctor, please call our main line at [phone] and press option 4 to reach your  doctor's medical assistant. If no one answers, please leave a voicemail as directed and we will return your call as soon as possible. Messages left after 4 pm will be answered the following business day.   You may also send [place] a message via [place]. We typically respond to MyChart messages within 1-2 business days.  For prescription refills, please ask your pharmacy to contact our office. Our fax number is [phone].  If you have an urgent issue when the clinic is closed that cannot wait until the next business day, you can page your doctor at the number below.    Please note that while we do our best to be available for urgent issues outside of office hours, we are not available 24/7.   If you have an urgent issue and are unable to reach [place], you may choose to seek medical care at your doctor's office, retail clinic, urgent care center, or emergency room.  If you have a medical emergency, please immediately call 911 or go to the emergency department.  Pager Numbers  - Dr. Nehemiah Massed: [phone]  - Dr. Laurence Ferrari: [phone]  - Dr. Nicole Kindred: [phone]  In the event of inclement weather, please call our main line at [phone] for an update on the status of any delays or closures.  Dermatology Medication Tips: Please keep the boxes that topical medications come in in order to help keep track of the instructions about where and how to use these. Pharmacies typically print the medication instructions only on the boxes and not directly on the medication tubes.   If your medication is too expensive, please contact our office at  [phone] option 4 or send [place] a message through [place].   We are unable to tell what your co-pay for medications will be in advance as this is different depending on your insurance coverage. However, we may be able to find a substitute medication at lower cost or fill out paperwork to get insurance to cover a needed medication.   If a prior authorization is  required to get your medication covered by your insurance company, please allow [place] 1-2 business days to complete this process.  Drug prices often vary depending on where the prescription is filled and some pharmacies may offer cheaper prices.  The website www.goodrx.com contains coupons for medications through different pharmacies. The prices here do not account for what the cost may be with help from insurance (it may be cheaper with your insurance), but the website can give you the price if you did not use any insurance.  - You can print the associated coupon and take it with your prescription to the pharmacy.  - You may also stop by our office during regular business hours and pick up a GoodRx coupon card.  - If you need your prescription sent electronically to a different pharmacy, notify our office through [facility] or by phone at [phone] option 4.     Si Usted Necesita Algo Despus de Su Visita  Tambin puede enviarnos un mensaje a travs de Pharmacist, community. Por lo general respondemos a los mensajes de MyChart en el transcurso de 1 a 2 das hbiles.  Para renovar recetas, por favor pida a su farmacia que se ponga en contacto con nuestra oficina. Harland Dingwall de fax es [place] [phone].  Si tiene un asunto urgente cuando la clnica est cerrada y que no puede esperar hasta el siguiente da hbil, puede llamar/localizar a su doctor(a) al nmero que aparece a continuacin.   Por favor, tenga en cuenta que aunque hacemos todo lo posible para estar disponibles para asuntos urgentes fuera del horario de [place], no estamos disponibles las 24 horas del da, los 7 das de la [place].   Si tiene un problema urgente y no puede comunicarse con nosotros, puede optar por buscar atencin mdica  en el consultorio de su doctor(a), en una clnica privada, en un centro de atencin urgente o en una sala de emergencias.  Si tiene Engineering geologist, por favor llame inmediatamente al 911 o vaya a  la sala de emergencias.  Nmeros de bper  - Dr. Nehemiah Massed: [phone]  - Dra. Moye: [phone]  - Dra. Nicole Kindred: [phone]  En caso de inclemencias del [place], por favor llame a [name] principal al [phone] para una actualizacin sobre el [place] de cualquier retraso o cierre.  Consejos para la medicacin en dermatologa: Por favor, guarde las cajas en las que vienen los medicamentos de uso tpico para ayudarle a seguir las instrucciones sobre dnde y cmo usarlos. Las farmacias generalmente imprimen las instrucciones del medicamento slo en las cajas y no directamente en los tubos del [place].   Si su medicamento es muy caro, por favor, pngase en contacto con Zigmund Daniel llamando al [phone] y presione la opcin 4 o envenos un mensaje a travs de Pharmacist, community.   No podemos decirle cul ser su copago por los medicamentos por adelantado ya que esto es diferente dependiendo de la cobertura de su seguro. Sin embargo, es posible que podamos encontrar un medicamento sustituto a Electrical engineer un formulario para que el seguro cubra el medicamento que se [place]  necesario.   Si se requiere una autorizacin previa para que su compaa de seguros [place] su medicamento, por favor permtanos de 1 a 2 das hbiles para completar este proceso.  Los precios de los medicamentos varan con frecuencia dependiendo del Environmental consultant de dnde se surte la receta y alguna farmacias pueden ofrecer precios ms baratos.  El sitio web www.goodrx.com tiene cupones para medicamentos de Airline pilot. Los precios aqu no tienen en cuenta lo que podra costar con la ayuda del seguro (puede ser ms barato con su seguro), pero el sitio web puede darle el precio si no utiliz Research scientist (physical sciences).  - Puede imprimir el cupn correspondiente y llevarlo con su receta a la farmacia.  - Tambin puede pasar por nuestra oficina durante el horario de atencin regular y Charity fundraiser una tarjeta de cupones de  GoodRx.  - Si necesita que su receta se enve electrnicamente a una farmacia diferente, informe a nuestra oficina a travs de MyChart de [place] o por telfono llamando al [phone] y presione la opcin 4.

## 2021-08-25 NOTE — Progress Notes (Signed)
Follow-Up Visit   Subjective  Caleb [name] is a 66 y.o. male who presents for the following: Psoriasis.  Patient presents for worsening of psoriasis in the ears with drainage. He is using a prescription cream to ears, unsure of name. He also has fluocinolone solution, but not using. He is on Cosentyx injections, Clobetasol Cream, Clobetasol/CeraVe mix, and was recently prescribed MTX 2.'5mg'$  (15 mg total per week) by rheumatologist. Will take first dose this week.  The following portions of the chart were reviewed this encounter and updated as appropriate:       Review of Systems:  No other skin or systemic complaints except as noted in HPI or Assessment and Plan.  Objective  Well appearing patient in no apparent distress; mood and affect are within normal limits.  A focused examination was performed including face, ears. Relevant physical exam findings are noted in the Assessment and Plan.  bilateral ears, left lower leg Erythema with scaling of the ear canal and ear helix, with excoriations; scaly patch on the left lateral lower leg.    Assessment & Plan  Psoriasis bilateral ears, left lower leg  With arthritis  Chronic and persistent condition with duration or expected duration over one year. Condition is symptomatic / bothersome to patient. Not to goal.  Psoriasis - severe on systemic "biologic" treatment injections.  Psoriasis is a chronic non-curable, but treatable genetic/hereditary disease that may have other systemic features affecting other organ systems such as joints (Psoriatic Arthritis).  It is linked with heart disease, inflammatory bowel disease, non-alcoholic fatty liver disease, and depression. Significant skin psoriasis and/or psoriatic arthritis may have significant symptoms and affects activities of daily activity and often benefits from systemic "biologic" injection treatments.  These "biologic" treatments have some potential side effects including  immunosuppression and require pre-treatment laboratory screening and periodic laboratory monitoring and periodic in person evaluation and monitoring by the attending dermatologist physician (long term medication management).  Patient has new Rx for MTX 2.'5mg'$  6 po qwk from rheumatologist to start.  Continue Cosentyx injections '300mg'$  q 4 wks. Pt is getting samples.  Start Zoryve Cream Apply to ears BID - samples given x 2. Lot TFAC Exp 06/2022  Restart fluocinolone solution Apply 2 gtts to ears BID until improved.  Continue clobetasol/CeraVe mix 1-2 times daily prn for less severe areas. Avoid applying to face, groin, and axilla. Use as directed. Long-term use can cause thinning of the skin.   Continue clobetasol cream to more severe areas 1-2 times daily prn. Avoid applying to face, groin, and axilla. Use as directed. Long-term use can cause thinning of the skin.  Continue Xtrac laser to plaque on left lower leg.  Reviewed risks of biologics including immunosuppression, infections, injection site reaction, and failure to improve condition. Goal is control of skin condition, not cure.  Some older biologics such as Humira and Enbrel may slightly increase risk of malignancy and may worsen congestive heart failure.  Talz and Cosentyx may cause inflammatory bowel disease to flare. The use of biologics requires long term medication management, including periodic office visits and monitoring of blood work.  Topical steroids (such as triamcinolone, fluocinolone, fluocinonide, mometasone, clobetasol, halobetasol, betamethasone, hydrocortisone) can cause thinning and lightening of the skin if they are used for too long in the same area. Your physician has selected the right strength medicine for your problem and area affected on the body. Please use your medication only as directed by your physician to prevent side effects.  Related Medications clobetasol (TEMOVATE) 0.05 % external solution Apply 1  application topically See admin instructions. Mix with cerave jar cream use as directed in handout  clobetasol cream (TEMOVATE) 5.79 % Apply 1 application topically 2 (two) times daily. Avoid applying to face, groin, and axilla. Use as directed. Long-term use can cause thinning of the skin.   Return in about 2 months (around 10/25/2021) for Psoriasis, recheck ears.  IJamesetta Orleans, CMA, am acting as scribe for [name], MD .  Documentation: I have reviewed the above documentation for accuracy and completeness, and I agree with the above.  [name] MD

## 2021-08-27 ENCOUNTER — Ambulatory Visit: Payer: Medicaid Other | Admitting: Dermatology

## 2021-08-27 DIAGNOSIS — L400 Psoriasis vulgaris: Secondary | ICD-10-CM

## 2021-08-27 NOTE — Progress Notes (Signed)
Patient here today for xtrac treatment for psoriasis vulgaris.    Total Surface Area: 12cm2 Total Energy: 50.99J Service Code: [id number]     Dicie Beam RMA  Documentation: I have reviewed the above documentation for accuracy and completeness, and I agree with the above.  [name] MD

## 2021-08-31 ENCOUNTER — Ambulatory Visit: Payer: Medicaid Other | Admitting: Dermatology

## 2021-08-31 DIAGNOSIS — L400 Psoriasis vulgaris: Secondary | ICD-10-CM

## 2021-08-31 NOTE — Progress Notes (Signed)
Patient here today for [facility] treatment for psoriasis vulgaris.   Total Surface Area: 8cm2 Total Energy: 33.99J Service Code: [id number]  Patient reports that he started his Methotrexate and he says that he is already feeling better and noticing a difference.   Dicie Beam RMA  Documentation: I have reviewed the above documentation for accuracy and completeness, and I agree with the above.  [name] MD

## 2021-09-01 ENCOUNTER — Telehealth: Payer: Self-pay

## 2021-09-01 NOTE — Telephone Encounter (Signed)
Faxed patients [employer] today asking them to revisit this patients Cosentyx PA. Was initially denied due to patient not trying either Methotrexate, Soriatane, or cyclosporin. Patient has tried Soriatane and has had harsh side effects of this medication. Chart notes attached detailing this was included.

## 2021-09-03 ENCOUNTER — Ambulatory Visit: Payer: Medicaid Other | Admitting: Dermatology

## 2021-09-03 DIAGNOSIS — L400 Psoriasis vulgaris: Secondary | ICD-10-CM | POA: Diagnosis not present

## 2021-09-03 NOTE — Progress Notes (Signed)
Patient here today for [facility] treatment for psoriasis vulgaris.    Total Surface Area: 12cm2 Total Energy: 40.79J Service Code: [id number]      Dicie Beam RMA  Documentation: I have reviewed the above documentation for accuracy and completeness, and I agree with the above.  [name] MD

## 2021-09-10 ENCOUNTER — Ambulatory Visit: Payer: Medicaid Other | Admitting: Dermatology

## 2021-09-10 DIAGNOSIS — L400 Psoriasis vulgaris: Secondary | ICD-10-CM | POA: Diagnosis not present

## 2021-09-10 NOTE — Progress Notes (Unsigned)
Patient here today for [facility] treatment for psoriasis vulgaris.    Total Surface Area: 16cm2 Total Energy: 54.38J Service Code: [id number]  Patient stated that he has had a flare in his left ear and that now he has noticed it spreading to his groin area. Told patient that I would make Dr. Nicole Kindred aware of this.       Dicie Beam RMA  Documentation: I have reviewed the above documentation for accuracy and completeness, and I agree with the above.  [name] MD

## 2021-09-12 ENCOUNTER — Other Ambulatory Visit: Payer: Self-pay | Admitting: Cardiovascular Disease

## 2021-09-12 ENCOUNTER — Other Ambulatory Visit: Payer: Self-pay | Admitting: Family Medicine

## 2021-09-12 ENCOUNTER — Other Ambulatory Visit: Payer: Self-pay | Admitting: Dermatology

## 2021-09-12 DIAGNOSIS — L400 Psoriasis vulgaris: Secondary | ICD-10-CM

## 2021-09-12 DIAGNOSIS — G8929 Other chronic pain: Secondary | ICD-10-CM

## 2021-09-17 ENCOUNTER — Ambulatory Visit: Payer: Medicaid Other | Admitting: Dermatology

## 2021-09-17 ENCOUNTER — Ambulatory Visit: Payer: Medicaid Other

## 2021-09-17 DIAGNOSIS — L400 Psoriasis vulgaris: Secondary | ICD-10-CM

## 2021-09-17 NOTE — Progress Notes (Unsigned)
Patient here today for [facility] treatment for psoriasis vulgaris.    Total Surface Area: 12cm2 Total Energy: 40.79J Service Code: [id number]  Dicie Beam RMA  Documentation: I have reviewed the above documentation for accuracy and completeness, and I agree with the above.  [name] MD

## 2021-09-24 ENCOUNTER — Ambulatory Visit: Payer: Medicaid Other | Admitting: Dermatology

## 2021-09-24 DIAGNOSIS — L400 Psoriasis vulgaris: Secondary | ICD-10-CM

## 2021-09-24 NOTE — Progress Notes (Signed)
Patient here today for [facility] treatment for psoriasis vulgaris.    Total Surface Area: 8cm Total Energy: 27.19J Service Code: [place] RMA  Documentation: I have reviewed the above documentation for accuracy and completeness, and I agree with the above.  [name] MD

## 2021-09-27 DIAGNOSIS — G4733 Obstructive sleep apnea (adult) (pediatric): Secondary | ICD-10-CM | POA: Diagnosis not present

## 2021-09-28 ENCOUNTER — Telehealth: Payer: Self-pay | Admitting: Pain Medicine

## 2021-09-28 NOTE — Telephone Encounter (Signed)
PT states that he is at pharmacy to pick up his prescription. Was told that the prescription needs an autho. Please give patient a call once it's been call. Thanks

## 2021-09-28 NOTE — Telephone Encounter (Signed)
PA sent and patient is aware.

## 2021-09-29 ENCOUNTER — Telehealth: Payer: Self-pay

## 2021-09-29 NOTE — Telephone Encounter (Signed)
He said he spoke to [facility] yesterday and she told him we had sent his script  but the pharmacy is telling him they never got it.

## 2021-09-29 NOTE — Telephone Encounter (Signed)
PA sent on yesterday for hydrocodone 7.5 - 325 mg.  Rx's are on file at [facility] for this month and October.  Patient will call back to see if the Rx will go through this afternoon.

## 2021-09-30 ENCOUNTER — Telehealth: Payer: Self-pay | Admitting: Pain Medicine

## 2021-09-30 NOTE — Telephone Encounter (Signed)
Pa being resent.

## 2021-09-30 NOTE — Telephone Encounter (Signed)
Pharmacy benefits stated that patient needs an autho for hydrocodone prescription. [phone]. Please give them a call. Thanks

## 2021-09-30 NOTE — Telephone Encounter (Signed)
PT call to see if the autho had been send in, I told patient that the number wasn't an working number that the person gave me. PT stated that the number is [phone]. PT asked to be called once it's been done. PT stated that he has been out for 3 days. Thanks

## 2021-09-30 NOTE — Telephone Encounter (Signed)
Attempted to call the number provided. "The call cannot be completed as dialed."

## 2021-10-01 ENCOUNTER — Ambulatory Visit: Payer: Medicaid Other | Admitting: Dermatology

## 2021-10-01 DIAGNOSIS — L400 Psoriasis vulgaris: Secondary | ICD-10-CM

## 2021-10-01 NOTE — Progress Notes (Signed)
Patient here today for [facility] treatment for psoriasis vulgaris.      Total Surface Area: 8cm Total Energy: 27.19J Service Code: [place] RMA  Documentation: I have reviewed the above documentation for accuracy and completeness, and I agree with the above.  [name] MD

## 2021-10-05 ENCOUNTER — Ambulatory Visit: Payer: Medicaid Other | Admitting: Family Medicine

## 2021-10-06 DIAGNOSIS — L400 Psoriasis vulgaris: Secondary | ICD-10-CM | POA: Diagnosis not present

## 2021-10-06 DIAGNOSIS — L4050 Arthropathic psoriasis, unspecified: Secondary | ICD-10-CM | POA: Diagnosis not present

## 2021-10-06 DIAGNOSIS — Z7960 Long term (current) use of unspecified immunomodulators and immunosuppressants: Secondary | ICD-10-CM | POA: Diagnosis not present

## 2021-10-08 ENCOUNTER — Ambulatory Visit: Payer: Medicaid Other | Admitting: Dermatology

## 2021-10-08 DIAGNOSIS — L400 Psoriasis vulgaris: Secondary | ICD-10-CM | POA: Diagnosis not present

## 2021-10-08 NOTE — Progress Notes (Signed)
Patient here today for [facility] treatment for psoriasis vulgaris.      Total Surface Area: 4cm Total Energy: 13.60J Service Code: [place] RMA    Documentation: I have reviewed the above documentation for accuracy and completeness, and I agree with the above.  [name] MD

## 2021-10-09 ENCOUNTER — Encounter: Payer: Self-pay | Admitting: Family Medicine

## 2021-10-09 ENCOUNTER — Ambulatory Visit: Payer: Medicaid Other | Admitting: Family Medicine

## 2021-10-09 VITALS — BP 117/77 | HR 66 | Temp 98.2°F | Resp 16 | Wt 198.0 lb

## 2021-10-09 DIAGNOSIS — I10 Essential (primary) hypertension: Secondary | ICD-10-CM

## 2021-10-09 DIAGNOSIS — J449 Chronic obstructive pulmonary disease, unspecified: Secondary | ICD-10-CM

## 2021-10-09 DIAGNOSIS — R55 Syncope and collapse: Secondary | ICD-10-CM

## 2021-10-09 DIAGNOSIS — G4733 Obstructive sleep apnea (adult) (pediatric): Secondary | ICD-10-CM | POA: Diagnosis not present

## 2021-10-09 DIAGNOSIS — G8929 Other chronic pain: Secondary | ICD-10-CM

## 2021-10-09 DIAGNOSIS — M25551 Pain in right hip: Secondary | ICD-10-CM | POA: Diagnosis not present

## 2021-10-09 DIAGNOSIS — E7800 Pure hypercholesterolemia, unspecified: Secondary | ICD-10-CM | POA: Diagnosis not present

## 2021-10-09 DIAGNOSIS — R7303 Prediabetes: Secondary | ICD-10-CM | POA: Diagnosis not present

## 2021-10-09 DIAGNOSIS — M25552 Pain in left hip: Secondary | ICD-10-CM

## 2021-10-09 DIAGNOSIS — R054 Cough syncope: Secondary | ICD-10-CM

## 2021-10-09 LAB — POCT GLYCOSYLATED HEMOGLOBIN (HGB A1C)
Est. average glucose Bld gHb Est-mCnc: 123
Hemoglobin A1C: 5.9 % — AB (ref 4.0–5.6)

## 2021-10-09 NOTE — Assessment & Plan Note (Signed)
Chronic and fairly well controlled He has had some more shortness of breath recently He has no wheeze on exam today however We will rerefer to pulmonology who is following him previously

## 2021-10-09 NOTE — Assessment & Plan Note (Signed)
Patient continues to use his BiPAP with good compliance He does report that he wonders if the settings need to be changed as it does not seem as effective anymore Referral back to pulmonology who was following him for this

## 2021-10-09 NOTE — Assessment & Plan Note (Signed)
Previously well controlled Continue statin Recheck FLP and CMP at next visit Goal LDL less than 70

## 2021-10-09 NOTE — Assessment & Plan Note (Signed)
Recommend low carb diet A1c slightly improved

## 2021-10-09 NOTE — Progress Notes (Signed)
I,Sulibeya S Dimas,acting as a Education administrator for Lavon Paganini, MD.,have documented all relevant documentation on the behalf of Lavon Paganini, MD,as directed by  Lavon Paganini, MD while in the presence of Lavon Paganini, MD.     Established patient visit   Patient: Caleb [name]   DOB: [birth year]   66 y.o. Male  MRN: [id number] Visit Date: 10/09/2021  Today's healthcare provider: Lavon Paganini, MD   Chief Complaint  Patient presents with   Hypertension   Hyperlipidemia   Hyperglycemia   Subjective    HPI  Prediabetes, Follow-up  Lab Results  Component Value Date   HGBA1C 5.9 (A) 10/09/2021   HGBA1C 6.1 (H) 04/03/2021   HGBA1C 5.8 (A) 09/12/2020   GLUCOSE 143 (H) 04/03/2021   GLUCOSE 99 01/06/2021   GLUCOSE 89 09/12/2020    Last seen for for this6 months ago.  Management since that visit includes no changes. Current symptoms include none and have been stable.  Prior visit with dietician: no Current diet: in general, a "healthy" diet   Current exercise:  work  Pertinent Labs:    Component Value Date/Time   CHOL 112 04/03/2021 1035   TRIG 175 (H) 04/03/2021 1035   CHOLHDL 2.6 12/18/2018 1448   CHOLHDL 3.6 10/21/2016 1636   CREATININE 0.82 04/03/2021 1035   CREATININE 0.98 10/21/2016 1636    Wt Readings from Last 3 Encounters:  10/09/21 198 lb (89.8 kg)  07/27/21 196 lb (88.9 kg)  07/09/21 196 lb (88.9 kg)    -----------------------------------------------------------------------------------------  Hypertension, follow-up  BP Readings from Last 3 Encounters:  10/09/21 117/77  07/27/21 126/86  07/09/21 (!) 143/82   Wt Readings from Last 3 Encounters:  10/09/21 198 lb (89.8 kg)  07/27/21 196 lb (88.9 kg)  07/09/21 196 lb (88.9 kg)     He was last seen for hypertension 6 months ago.  BP at that visit was 102/63. Management since that visit includes no changes. He reports excellent compliance with treatment. He is not having side  effects.  He is exercising. He is not adherent to low salt diet.   Outside blood pressures are not checked.  He does smoke.  Use of agents associated with hypertension: none.   --------------------------------------------------------------------------------------------------- Lipid/Cholesterol, follow-up  Last Lipid Panel: Lab Results  Component Value Date   CHOL 112 04/03/2021   LDLCALC 38 04/03/2021   HDL 45 04/03/2021   TRIG 175 (H) 04/03/2021    He was last seen for this 6 months ago.  Management since that visit includes no changes.  He reports excellent compliance with treatment. He is not having side effects.   Symptoms: No appetite changes No foot ulcerations  No chest pain No chest pressure/discomfort  No dyspnea No orthopnea  No fatigue No lower extremity edema  No palpitations No paroxysmal nocturnal dyspnea  No nausea No numbness or tingling of extremity  No polydipsia No polyuria  No speech difficulty No syncope     Last metabolic panel Lab Results  Component Value Date   GLUCOSE 143 (H) 04/03/2021   NA 137 04/03/2021   K 4.0 04/03/2021   BUN 17 04/03/2021   CREATININE 0.82 04/03/2021   EGFR 99 04/03/2021   GFRNONAA >60 02/23/2020   CALCIUM 9.4 04/03/2021   AST 21 04/03/2021   ALT 17 04/03/2021   The ASCVD Risk score (Arnett DK, et al., 2019) failed to calculate for the following reasons:   The patient has a prior MI or stroke diagnosis  --------------------------------------------------------------------------------------------------- L hip  pain for several months.  In L groin.  Pain worse with being up on his feet all day long. Can catch with twisting.  Was recently out of pain medications x 4 days  Going back on cosentyx for psoriasis and PsA  Recent labs with Rheum    Medications: Outpatient Medications Prior to Visit  Medication Sig   albuterol (VENTOLIN HFA) 108 (90 Base) MCG/ACT inhaler Inhale 2 puffs into the lungs every 6 (six)  hours as needed for wheezing or shortness of breath.   ANORO ELLIPTA 62.5-25 MCG/ACT AEPB INHALE 1 PUFF BY MOUTH EVERY DAY   aspirin EC 81 MG tablet Take 81 mg by mouth daily.    cetirizine (ZYRTEC) 10 MG tablet TAKE 1 TABLET BY MOUTH EVERY DAY   clobetasol (TEMOVATE) 0.05 % external solution Apply 1 application topically See admin instructions. Mix with cerave jar cream use as directed in handout   clobetasol cream (TEMOVATE) 0.88 % Apply 1 application topically 2 (two) times daily. Avoid applying to face, groin, and axilla. Use as directed. Long-term use can cause thinning of the skin.   fluocinolone (SYNALAR) 0.01 % external solution Apply topically 2 (two) times daily. To ears for psoriasis   fluticasone (FLONASE) 50 MCG/ACT nasal spray Place 2 sprays into both nostrils daily.   folic acid (FOLVITE) 1 MG tablet Take by mouth.   hydrochlorothiazide (HYDRODIURIL) 25 MG tablet TAKE 1 TABLET (25 MG TOTAL) BY MOUTH DAILY.   HYDROcodone-acetaminophen (NORCO) 7.5-325 MG tablet Take 1 tablet by mouth every 6 (six) hours as needed for severe pain. Must last 30 days.   [START ON 10/24/2021] HYDROcodone-acetaminophen (NORCO) 7.5-325 MG tablet Take 1 tablet by mouth every 6 (six) hours as needed for severe pain. Must last 30 days.   ibuprofen (ADVIL) 800 MG tablet Take 1 tablet (800 mg total) by mouth every 6 (six) hours as needed.   isosorbide mononitrate (IMDUR) 30 MG 24 hr tablet TAKE 1/2 OF A TABLET (15 MG TOTAL) BY MOUTH DAILY   lidocaine (XYLOCAINE) 5 % ointment Apply 1 application topically as needed.   losartan (COZAAR) 100 MG tablet TAKE 1 TABLET BY MOUTH EVERY DAY   Melatonin 10 MG CAPS Take 20 mg by mouth at bedtime.   methocarbamol (ROBAXIN) 750 MG tablet TAKE 1 TABLET (750 MG TOTAL) BY MOUTH EVERY 8 (EIGHT) HOURS AS NEEDED FOR MUSCLE SPASMS.   methotrexate (RHEUMATREX) 2.5 MG tablet Take 15 mg by mouth once a week.   montelukast (SINGULAIR) 10 MG tablet TAKE 1 TABLET BY MOUTH EVERYDAY AT  BEDTIME   mupirocin cream (BACTROBAN) 2 % Apply 1 application. topically 2 (two) times daily.   naloxone (NARCAN) nasal spray 4 mg/0.1 mL Use in case of opioid overdose   nitroGLYCERIN (NITROSTAT) 0.4 MG SL tablet Place 1 tablet (0.4 mg total) under the tongue every 5 (five) minutes as needed for chest pain.   pregabalin (LYRICA) 150 MG capsule TAKE 1 CAPSULE BY MOUTH THREE TIMES A DAY   rosuvastatin (CRESTOR) 40 MG tablet TAKE 1 TABLET BY MOUTH EVERY DAY   triamcinolone ointment (KENALOG) 0.5 % Apply 1 application topically 2 (two) times daily.   TURMERIC PO Take 1 capsule by mouth daily.   HYDROcodone-acetaminophen (NORCO) 7.5-325 MG tablet Take 1 tablet by mouth every 6 (six) hours as needed for severe pain. Must last 30 days.   [DISCONTINUED] tadalafil (CIALIS) 5 MG tablet Take 1-4 tablets by mouth as needed (Patient not taking: Reported on 10/09/2021)   [DISCONTINUED] varenicline (  CHANTIX CONTINUING MONTH PAK) 1 MG tablet Take 1 tablet (1 mg total) by mouth 2 (two) times daily. (Patient not taking: Reported on 10/09/2021)   No facility-administered medications prior to visit.    Review of Systems  Constitutional:  Positive for fatigue. Negative for appetite change and chills.  Respiratory:  Positive for shortness of breath. Negative for chest tightness.   Cardiovascular:  Negative for chest pain, palpitations and leg swelling.  Gastrointestinal:  Negative for abdominal pain, diarrhea, nausea and vomiting.       Objective    BP 117/77 (BP Location: Left Arm, Patient Position: Sitting, Cuff Size: Large)   Pulse 66   Temp 98.2 F (36.8 C) (Oral)   Resp 16   Wt 198 lb (89.8 kg)   SpO2 96%   BMI 30.11 kg/m  BP Readings from Last 3 Encounters:  10/09/21 117/77  07/27/21 126/86  07/09/21 (!) 143/82   Wt Readings from Last 3 Encounters:  10/09/21 198 lb (89.8 kg)  07/27/21 196 lb (88.9 kg)  07/09/21 196 lb (88.9 kg)      Physical Exam Vitals reviewed.  Constitutional:       General: He is not in acute distress.    Appearance: Normal appearance. He is not diaphoretic.  HENT:     Head: Normocephalic and atraumatic.  Eyes:     General: No scleral icterus.    Conjunctiva/sclera: Conjunctivae normal.  Cardiovascular:     Rate and Rhythm: Normal rate and regular rhythm.     Pulses: Normal pulses.     Heart sounds: Normal heart sounds. No murmur heard. Pulmonary:     Effort: Pulmonary effort is normal. No respiratory distress.     Breath sounds: Normal breath sounds. No wheezing or rhonchi.  Musculoskeletal:     Cervical back: Neck supple.     Right lower leg: No edema.     Left lower leg: No edema.     Comments: Hip ROM limited in L, No pain with internal rotation/external rotation  Lymphadenopathy:     Cervical: No cervical adenopathy.  Skin:    General: Skin is warm and dry.  Neurological:     Mental Status: He is alert and oriented to person, place, and time. Mental status is at baseline.  Psychiatric:        Mood and Affect: Mood normal.        Behavior: Behavior normal.       Results for orders placed or performed in visit on 10/09/21  POCT glycosylated hemoglobin (Hb A1C)  Result Value Ref Range   Hemoglobin A1C 5.9 (A) 4.0 - 5.6 %   Est. average glucose Bld gHb Est-mCnc 123     Assessment & Plan     Problem List Items Addressed This Visit       Cardiovascular and Mediastinum   Essential hypertension    Well controlled Continue current medications Reviewed recent metabolic panel F/u in 6 months         Respiratory   OSA treated with BiPAP    Patient continues to use his BiPAP with good compliance He does report that he wonders if the settings need to be changed as it does not seem as effective anymore Referral back to pulmonology who was following him for this      Relevant Orders   Ambulatory referral to Pulmonology   COPD (chronic obstructive pulmonary disease) ([place])    Chronic and fairly well controlled He has had some  more shortness  of breath recently He has no wheeze on exam today however We will rerefer to pulmonology who is following him previously      Relevant Orders   Ambulatory referral to Pulmonology     Other   Chronic hip pain (Bilateral) (Chronic)    Chronic hip and other orthopedic pain Acute on chronic left hip/groin pain We will obtain x-ray Pain management following      Relevant Orders   DG Hip Unilat W OR W/O Pelvis 2-3 Views Left   Hyperlipidemia    Previously well controlled Continue statin Recheck FLP and CMP at next visit Goal LDL less than 70      Cough syncope   Relevant Orders   Ambulatory referral to Pulmonology   Prediabetes - Primary    Recommend low carb diet A1c slightly improved      Relevant Orders   POCT glycosylated hemoglobin (Hb A1C) (Completed)     Return in about 6 months (around 04/10/2022) for CPE.      I, Lavon Paganini, MD, have reviewed all documentation for this visit. The documentation on 10/09/21 for the exam, diagnosis, procedures, and orders are all accurate and complete.   Manasa Spease, Dionne Bucy, MD, MPH [facility]

## 2021-10-09 NOTE — Assessment & Plan Note (Signed)
Chronic hip and other orthopedic pain Acute on chronic left hip/groin pain We will obtain x-ray Pain management following

## 2021-10-09 NOTE — Assessment & Plan Note (Signed)
Well controlled Continue current medications Reviewed recent metabolic panel F/u in 6 months  

## 2021-10-19 ENCOUNTER — Ambulatory Visit
Admission: RE | Admit: 2021-10-19 | Discharge: 2021-10-19 | Disposition: A | Discharge status: Home / Self Care | Payer: Medicaid Other | Attending: Family Medicine | Admitting: Family Medicine

## 2021-10-19 ENCOUNTER — Ambulatory Visit
Admission: RE | Admit: 2021-10-19 | Discharge: 2021-10-19 | Disposition: A | Discharge status: Home / Self Care | Payer: Medicaid Other | Source: Ambulatory Visit | Attending: Family Medicine | Admitting: Family Medicine

## 2021-10-19 DIAGNOSIS — R1032 Left lower quadrant pain: Secondary | ICD-10-CM | POA: Insufficient documentation

## 2021-10-19 DIAGNOSIS — G8929 Other chronic pain: Secondary | ICD-10-CM

## 2021-10-19 DIAGNOSIS — M25551 Pain in right hip: Secondary | ICD-10-CM | POA: Insufficient documentation

## 2021-10-19 DIAGNOSIS — M25552 Pain in left hip: Secondary | ICD-10-CM | POA: Diagnosis not present

## 2021-10-21 ENCOUNTER — Encounter (HOSPITAL_BASED_OUTPATIENT_CLINIC_OR_DEPARTMENT_OTHER): Payer: Medicaid Other | Admitting: Pain Medicine

## 2021-10-21 ENCOUNTER — Ambulatory Visit: Payer: Medicaid Other | Admitting: Urology

## 2021-10-21 DIAGNOSIS — R892 Abnormal level of other drugs, medicaments and biological substances in specimens from other organs, systems and tissues: Secondary | ICD-10-CM

## 2021-10-21 DIAGNOSIS — Z91148 Patient's other noncompliance with medication regimen for other reason: Secondary | ICD-10-CM

## 2021-10-21 DIAGNOSIS — F1490 Cocaine use, unspecified, uncomplicated: Secondary | ICD-10-CM

## 2021-10-21 NOTE — Progress Notes (Signed)
(  10/21/2021) letter sent to the patient informing him that we will be discontinuing opioid pharmacotherapy since his 07/27/2021 UDS came back abnormal, positive for Benzoylecgonine, a COCAINE metabolite.  This is a violation of our medication agreement and therefore wound will no longer continue to provide pharmacotherapy.  The letter being sent to the patient today contains information on how to slowly taper himself off of the opioids.

## 2021-10-22 ENCOUNTER — Telehealth: Payer: Self-pay

## 2021-10-22 NOTE — Telephone Encounter (Signed)
Patient notified that he has been discharged from the clinic due to Positive UDS. Per Dr Dossie Arbour

## 2021-10-22 NOTE — Telephone Encounter (Signed)
Clarification.  Pateint called and informed that he will no longer be getting opioid prescriptions from Dr Dossie Arbour but we will be available for interventional treatments. Marland Kitchen

## 2021-10-25 ENCOUNTER — Other Ambulatory Visit: Payer: Self-pay | Admitting: Family Medicine

## 2021-10-27 ENCOUNTER — Ambulatory Visit: Payer: Medicaid Other | Admitting: Adult Health

## 2021-10-27 ENCOUNTER — Encounter: Payer: Self-pay | Admitting: Adult Health

## 2021-10-27 ENCOUNTER — Telehealth: Payer: Self-pay

## 2021-10-27 VITALS — BP 120/60 | HR 71 | Temp 97.7°F | Ht 68.0 in | Wt 196.2 lb

## 2021-10-27 DIAGNOSIS — F17200 Nicotine dependence, unspecified, uncomplicated: Secondary | ICD-10-CM

## 2021-10-27 DIAGNOSIS — J449 Chronic obstructive pulmonary disease, unspecified: Secondary | ICD-10-CM | POA: Diagnosis not present

## 2021-10-27 DIAGNOSIS — G4733 Obstructive sleep apnea (adult) (pediatric): Secondary | ICD-10-CM

## 2021-10-27 MED ORDER — ANORO ELLIPTA 62.5-25 MCG/ACT IN AEPB: 1.0000 | INHALATION_SPRAY | Freq: Every day | RESPIRATORY_TRACT | 6 refills | Status: DC

## 2021-10-27 MED ORDER — ALBUTEROL SULFATE HFA 108 (90 BASE) MCG/ACT IN AERS: 2.0000 | INHALATION_SPRAY | Freq: Four times a day (QID) | RESPIRATORY_TRACT | 6 refills | Status: DC | PRN

## 2021-10-27 NOTE — Assessment & Plan Note (Signed)
Moderate COPD-ongoing smoking.  Patient is continue on Anoro.  Smoking cessation discussed in detail.  Referred to the lung cancer screening program.  Plan  Patient Instructions  Continue on ANORO 1 puff daily  Work on not smoking (1800QUITNOW)  Refer to Lung cancer CT chest screening program.  saline nasal spray 2 puffs Twice daily   saline nasal gel At bedtime  .   Wear BIPAP At bedtime  -wear at least 6hrs each night  Keep up good work  Do not drive if sleepy   Follow up with Dr. Hermina Staggers or Myka Hitz NP in 6 months and As needed
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
## 2021-10-27 NOTE — Patient Instructions (Addendum)
Continue on ANORO 1 puff daily  Work on not smoking (1800QUITNOW)  Refer to Lung cancer CT chest screening program.  saline nasal spray 2 puffs Twice daily   saline nasal gel At bedtime  .   Wear BIPAP At bedtime  -wear at least 6hrs each night  Keep up good work  Do not drive if sleepy   Follow up with Dr. Hermina Staggers or Saul Dorsi NP in 6 months and As needed

## 2021-10-27 NOTE — Telephone Encounter (Signed)
Per Rexene Edison, NP verbally-- would like increased IPAP pressure to 16 in hopes to decrease AHI.  Patient is aware of changed and voiced his understanding. Order has been placed. Nothing further needed.

## 2021-10-27 NOTE — Assessment & Plan Note (Addendum)
Improved compliance on BiPAP. Will adjust BIPAP pressure to decrease number of event, change IPAP max 16cmH2O.   Plan  Patient Instructions  Continue on ANORO 1 puff daily  Work on not smoking (1800QUITNOW)  Refer to Lung cancer CT chest screening program.  saline nasal spray 2 puffs Twice daily   saline nasal gel At bedtime  .   Wear BIPAP At bedtime  -wear at least 6hrs each night  Keep up good work  Do not drive if sleepy   Follow up with Dr. Hermina Staggers or Fin Hupp NP in 6 months and As needed

## 2021-10-27 NOTE — Telephone Encounter (Signed)
Requested Prescriptions  Pending Prescriptions Disp Refills  . ANORO ELLIPTA 62.5-25 MCG/ACT AEPB [Pharmacy Med Name: ANORO ELLIPTA 62.5-25 MCG INH] 60 each 1    Sig: INHALE 1 PUFF BY MOUTH EVERY DAY     Pulmonology:  [facility] Products Passed - 10/25/2021 11:00 AM      Passed - Valid encounter within last 12 months    Recent Outpatient Visits          2 weeks ago [place], Dionne Bucy, MD   3 months ago Chronic sacroiliac joint pain (Bilateral)   [facility] [place], [place], PA-C   6 months ago Encounter for annual physical exam   [facility] [place], Dionne Bucy, MD   8 months ago Perforation of both tympanic [place] Thedore Mins, [place], PA-C   9 months ago Acute midline low back pain without sciatica   [place], Jake Church, DO      Future Appointments            Tomorrow Billey Co, MD [facility] [place]   In 1 week [name], MD [place]   In 5 months Bacigalupo, Dionne Bucy, MD [facility], [place]

## 2021-10-28 ENCOUNTER — Encounter: Payer: Self-pay | Admitting: Urology

## 2021-10-28 ENCOUNTER — Ambulatory Visit: Payer: Medicaid Other | Admitting: Urology

## 2021-10-28 VITALS — BP 124/80 | HR 74 | Ht 68.0 in | Wt 196.0 lb

## 2021-10-28 DIAGNOSIS — Z125 Encounter for screening for malignant neoplasm of prostate: Secondary | ICD-10-CM | POA: Diagnosis not present

## 2021-10-28 DIAGNOSIS — N529 Male erectile dysfunction, unspecified: Secondary | ICD-10-CM | POA: Diagnosis not present

## 2021-10-28 MED ORDER — SILDENAFIL CITRATE 100 MG PO TABS: 100.0000 mg | ORAL_TABLET | Freq: Every day | ORAL | 3 refills | Status: DC | PRN

## 2021-10-28 NOTE — Progress Notes (Signed)
   10/28/2021 9:16 AM   Caleb [name] 1955-02-07 [id number]  Reason for visit: Follow up ED, PSA screening  HPI: 66 year old comorbid male with chronic pain on narcotics who I have followed for ED.  He previously was using Cialis 20 mg on demand with good results, but was having significant headaches at that dose.  At our last visit in October 2022 he opted for trial of Revatio/sildenafil on demand, and has been having success with 80 to 100 mg as needed.  He denies any side effects with that medication and is interested in refilling.  He has not taken nitrates at all, even though that is on his medication list.  PSA was checked in March 2023 and was 1.1, reassurance provided regarding normal PSA, as well as the AUA guidelines regarding screening.  Sildenafil 100 mg on demand sent to [facility] RTC 1 year  Billey Co, MD  [place] [address] [place], [place] [address] [phone]

## 2021-10-28 NOTE — Patient Instructions (Signed)
Your prescription was sent to [email or web address], go to the website and sign up for a free account

## 2021-11-03 ENCOUNTER — Ambulatory Visit: Payer: Medicaid Other | Admitting: Dermatology

## 2021-11-03 DIAGNOSIS — L409 Psoriasis, unspecified: Secondary | ICD-10-CM

## 2021-11-03 DIAGNOSIS — L280 Lichen simplex chronicus: Secondary | ICD-10-CM | POA: Diagnosis not present

## 2021-11-03 DIAGNOSIS — L4050 Arthropathic psoriasis, unspecified: Secondary | ICD-10-CM | POA: Diagnosis not present

## 2021-11-03 NOTE — Progress Notes (Signed)
Follow-Up Visit   Subjective  Caleb [name] is a 66 y.o. male who presents for the following: Psoriasis (2 month follow up. Hx at ears, legs, face. Currently taking cocentyx, zoryve, fluocinolone solution and Mtx 2.5 mg. Patient has also ungone Xtrac laser treatments to areas at right leg. He reports right legs is much clearer. Patient reports flared currently inside ears, eyebrows, and back. /). Rash on back came up this summer after he got new car with leather seats.  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  No side effects noticed from Cosentyx or MTX.  Overall doing better, and joint pain is improved but not completely resolved.   The following portions of the chart were reviewed this encounter and updated as appropriate:      Review of Systems: No other skin or systemic complaints except as noted in HPI or Assessment and Plan.   Objective  Well appearing patient in no apparent distress; mood and affect are within normal limits.  A focused examination was performed including back, face, ears, arms, legs. Relevant physical exam findings are noted in the Assessment and Plan.  b/l ears, arms, legs Pink scaly patches of ears, R eyebrow, left lower leg is clear today   Right Lower Back Large lichenified scaly patch at right mid back    Assessment & Plan  Psoriasis b/l ears, arms, legs  With arthritis   Chronic and persistent condition with duration or expected duration over one year. Condition is symptomatic / bothersome to patient. Improving but not to goal. Overall pt said joints are feeling better and less stiff, but still has some issues with hips and hands.   Psoriasis - severe on systemic "biologic" treatment injections.  Psoriasis is a chronic non-curable, but treatable genetic/hereditary disease that may have other systemic features affecting other organ systems such as joints (Psoriatic Arthritis).  It is linked with heart disease, inflammatory bowel  disease, non-alcoholic fatty liver disease, and depression. Significant skin psoriasis and/or psoriatic arthritis may have significant symptoms and affects activities of daily activity and often benefits from systemic "biologic" injection treatments.  These "biologic" treatments have some potential side effects including immunosuppression and require pre-treatment laboratory screening and periodic laboratory monitoring and periodic in person evaluation and monitoring by the attending dermatologist physician (long term medication management).  Reviewed recent LFTs and CBC/diff 10/2021 - normal    Continue MTX 2.'5mg'$  6 po qwk from rheumatologist for arthritis   Continue Cosentyx injections '300mg'$  q 4 wks. Pt was approved and patient is self injecting at home.    Restart  Zoryve Cream Apply to aas ears and eyebrows qd    Continue fluocinolone solution Apply 2 gtts to ears and eyebrows BID prn itchy rash until improved.   Continue clobetasol/CeraVe mix 1-2 times daily prn for less severe areas. Avoid applying to face, groin, and axilla. Use as directed. Long-term use can cause thinning of the skin.   Continue clobetasol cream to more severe areas 1-2 times daily prn. Avoid applying to face, groin, and axilla. Use as directed. Long-term use can cause thinning of the skin.   Discontinue Xtrac laser to plaque on left lower leg- leg is clear   Reviewed risks of biologics including immunosuppression, infections, injection site reaction, and failure to improve condition. Goal is control of skin condition, not cure.  Some older biologics such as Humira and Enbrel may slightly increase risk of malignancy and may worsen congestive heart failure.  Talz and Cosentyx may cause inflammatory bowel disease to flare. The use of biologics requires long term medication management, including periodic office visits and monitoring of blood work.   Topical steroids (such as triamcinolone, fluocinolone, fluocinonide,  mometasone, clobetasol, halobetasol, betamethasone, hydrocortisone) can cause thinning and lightening of the skin if they are used for too long in the same area. Your physician has selected the right strength medicine for your problem and area affected on the body. Please use your medication only as directed by your physician to prevent side effects.   Lichen simplex chronicus Right Lower Back  Possible irritation from new leather seat in car- Recommend seat cover   Recommend lotion applicator to apply clobetasol cream bid for up to 4 weeks.  Topical steroids (such as triamcinolone, fluocinolone, fluocinonide, mometasone, clobetasol, halobetasol, betamethasone, hydrocortisone) can cause thinning and lightening of the skin if they are used for too long in the same area. Your physician has selected the right strength medicine for your problem and area affected on the body. Please use your medication only as directed by your physician to prevent side effects.     Return in about 6 months (around 05/04/2022), or if symptoms worsen or fail to improve, for psoriasis.  I, Ruthell Rummage, CMA, am acting as scribe for [name], MD.  Documentation: I have reviewed the above documentation for accuracy and completeness, and I agree with the above.  [name] MD

## 2021-11-03 NOTE — Patient Instructions (Addendum)
For itchy areas at eyebrows and inside ears  Start Zoryve cream - apply twice daily to affected areas   Continue fluocinolone solution Apply 2 drops to ears twice daily until improved. Can also use at eyebrows.   Continue Methotrexate as prescribed by rheumatologist  Continue Cocentyx   Due to recent changes in healthcare laws, you may see results of your pathology and/or laboratory studies on MyChart before the doctors have had a chance to review them. We understand that in some cases there may be results that are confusing or concerning to you. Please understand that not all results are received at the same time and often the doctors may need to interpret multiple results in order to provide you with the best plan of care or course of treatment. Therefore, we ask that you please give Caleb [name] 2 business days to thoroughly review all your results before contacting the office for clarification. Should we see a critical lab result, you will be contacted sooner.   If You Need Anything After Your Visit  If you have any questions or concerns for your doctor, please call our main line at [phone] and press option 4 to reach your doctor's medical assistant. If no one answers, please leave a voicemail as directed and we will return your call as soon as possible. Messages left after 4 pm will be answered the following business day.   You may also send Caleb [name] a message via [place]. We typically respond to MyChart messages within 1-2 business days.  For prescription refills, please ask your pharmacy to contact our office. Our fax number is [phone].  If you have an urgent issue when the clinic is closed that cannot wait until the next business day, you can page your doctor at the number below.    Please note that while we do our best to be available for urgent issues outside of office hours, we are not available 24/7.   If you have an urgent issue and are unable to reach Caleb [name], you may choose to seek  medical care at your doctor's office, retail clinic, urgent care center, or emergency room.  If you have a medical emergency, please immediately call 911 or go to the emergency department.  Pager Numbers  - Dr. Nehemiah Massed: [phone]  - Dr. Laurence Ferrari: [phone]  - Dr. Nicole Kindred: [phone]  In the event of inclement weather, please call our main line at [phone] for an update on the status of any delays or closures.  Dermatology Medication Tips: Please keep the boxes that topical medications come in in order to help keep track of the instructions about where and how to use these. Pharmacies typically print the medication instructions only on the boxes and not directly on the medication tubes.   If your medication is too expensive, please contact our office at [phone] option 4 or send Caleb [name] a message through [place].   We are unable to tell what your co-pay for medications will be in advance as this is different depending on your insurance coverage. However, we may be able to find a substitute medication at lower cost or fill out paperwork to get insurance to cover a needed medication.   If a prior authorization is required to get your medication covered by your insurance company, please allow Caleb [name] 1-2 business days to complete this process.  Drug prices often vary depending on where the prescription is filled and some pharmacies may offer cheaper prices.  The website www.goodrx.com contains coupons for medications through different  through different pharmacies. The prices here do not account for what the cost may be with help from insurance (it may be cheaper with your insurance), but the website can give you the price if you did not use any insurance.  - You can print the associated coupon and take it with your prescription to the pharmacy.  - You may also stop by our office during regular business hours and pick up a GoodRx coupon card.  - If you need your prescription sent electronically to a  different pharmacy, notify our office through [place] MyChart or by phone at [phone] option 4.     Si Usted Necesita Algo Despus de Su Visita  Tambin puede enviarnos un mensaje a travs de MyChart. Por lo general respondemos a los mensajes de MyChart en el transcurso de 1 a 2 das hbiles.  Para renovar recetas, por favor pida a su farmacia que se ponga en contacto con nuestra oficina. Nuestro nmero de fax es el [phone].  Si tiene un asunto urgente cuando la clnica est cerrada y que no puede esperar hasta el siguiente da hbil, puede llamar/localizar a su doctor(a) al nmero que aparece a continuacin.   Por favor, tenga en cuenta que aunque hacemos todo lo posible para estar disponibles para asuntos urgentes fuera del horario de oficina, no estamos disponibles las 24 horas del da, los 7 das de la semana.   Si tiene un problema urgente y no puede comunicarse con nosotros, puede optar por buscar atencin mdica  en el consultorio de su doctor(a), en una clnica privada, en un centro de atencin urgente o en una sala de emergencias.  Si tiene una emergencia mdica, por favor llame inmediatamente al 911 o vaya a la sala de emergencias.  Nmeros de bper  - Dr. Kowalski: [phone]  - Dra. Moye: [phone]  - Dra. Stewart: [phone]  En caso de inclemencias del tiempo, por favor llame a nuestra lnea principal al [phone] para una actualizacin sobre el estado de cualquier retraso o cierre.  Consejos para la medicacin en dermatologa: Por favor, guarde las cajas en las que vienen los medicamentos de uso tpico para ayudarle a seguir las instrucciones sobre dnde y cmo usarlos. Las farmacias generalmente imprimen las instrucciones del medicamento slo en las cajas y no directamente en los tubos del medicamento.   Si su medicamento es muy caro, por favor, pngase en contacto con nuestra oficina llamando al [phone] y presione la opcin 4 o envenos un  mensaje a travs de MyChart.   No podemos decirle cul ser su copago por los medicamentos por adelantado ya que esto es diferente dependiendo de la cobertura de su seguro. Sin embargo, es posible que podamos encontrar un medicamento sustituto a menor costo o llenar un formulario para que el seguro cubra el medicamento que se considera necesario.   Si se requiere una autorizacin previa para que su compaa de seguros cubra su medicamento, por favor permtanos de 1 a 2 das hbiles para completar este proceso.  Los precios de los medicamentos varan con frecuencia dependiendo del lugar de dnde se surte la receta y alguna farmacias pueden ofrecer precios ms baratos.  El sitio web www.goodrx.com tiene cupones para medicamentos de diferentes farmacias. Los precios aqu no tienen en cuenta lo que podra costar con la ayuda del seguro (puede ser ms barato con su seguro), pero el sitio web puede darle el precio si no utiliz ningn seguro.  - Puede imprimir el cupn correspondiente y llevarlo   con su receta a la farmacia.  - Tambin puede pasar por nuestra oficina durante el horario de atencin regular y recoger una tarjeta de cupones de GoodRx.  - Si necesita que su receta se enve electrnicamente a una farmacia diferente, informe a nuestra oficina a travs de MyChart de [place] o por telfono llamando al [phone] y presione la opcin 4.  

## 2021-11-09 ENCOUNTER — Other Ambulatory Visit: Payer: Self-pay | Admitting: Pulmonary Disease

## 2021-11-10 ENCOUNTER — Telehealth: Payer: Self-pay | Admitting: Adult Health

## 2021-11-10 MED ORDER — MONTELUKAST SODIUM 10 MG PO TABS: 10.0000 mg | ORAL_TABLET | Freq: Every day | ORAL | 2 refills | Status: DC

## 2021-11-10 NOTE — Telephone Encounter (Signed)
Called patient this morning and verified medication he was needing refilled and the pharmacy. Nothing further needed

## 2021-11-16 ENCOUNTER — Encounter: Payer: Medicaid Other | Admitting: Pain Medicine

## 2021-11-17 ENCOUNTER — Telehealth: Payer: Self-pay

## 2021-11-17 DIAGNOSIS — G8929 Other chronic pain: Secondary | ICD-10-CM

## 2021-11-17 DIAGNOSIS — Z79891 Long term (current) use of opiate analgesic: Secondary | ICD-10-CM

## 2021-11-17 NOTE — Telephone Encounter (Signed)
Copied from [place] [id number]. Topic: Referral - Status >> Nov 17, 2021 10:58 AM Tiffany B wrote: Patient requesting a referral to another pain management specialist due to nerve blocks. Patient stats his previous pain management doctor was not advocating for him when insurance would deny coverage.  Patient would like a follow up call once completed.Patient stated he did discuss this with PCP at last [place] visit.

## 2021-11-17 NOTE — Telephone Encounter (Signed)
Ok to send referral as requested 

## 2021-11-17 NOTE — Addendum Note (Signed)
Addended by: Shawna Orleans on: 11/17/2021 01:17 PM   Modules accepted: Orders

## 2021-11-19 ENCOUNTER — Other Ambulatory Visit: Payer: Self-pay | Admitting: Family Medicine

## 2021-11-19 ENCOUNTER — Other Ambulatory Visit: Payer: Self-pay | Admitting: Dermatology

## 2021-11-19 DIAGNOSIS — G8929 Other chronic pain: Secondary | ICD-10-CM

## 2021-11-19 DIAGNOSIS — L400 Psoriasis vulgaris: Secondary | ICD-10-CM

## 2021-11-19 NOTE — Telephone Encounter (Signed)
Requested medication (s) are due for refill today - provider review   Requested medication (s) are on the active medication list -yes  Future visit scheduled -yes  Last refill: -09/14/21 #90  Notes to clinic: non delegated Rx  Requested Prescriptions  Pending Prescriptions Disp Refills   methocarbamol (ROBAXIN) 750 MG tablet [Pharmacy Med Name: METHOCARBAMOL 750 MG TABLET] 90 tablet 0    Sig: TAKE 1 TABLET (750 MG TOTAL) BY MOUTH EVERY 8 (EIGHT) HOURS AS NEEDED FOR MUSCLE SPASMS.     Not Delegated - Analgesics:  Muscle Relaxants Failed - 11/19/2021 11:23 AM      Failed - This refill cannot be delegated      Passed - Valid encounter within last 6 months    Recent Outpatient Visits           1 month ago Ayden Antler, Dionne Bucy, MD   4 months ago Chronic sacroiliac joint pain (Bilateral)   [facility] [place], [place], PA-C   7 months ago Encounter for annual physical exam   [facility] [place], Dionne Bucy, MD   9 months ago Perforation of both tympanic [place] Thedore Mins, [place], PA-C   10 months ago Acute midline low back pain without sciatica   [place], DO       Future Appointments             In 4 months Bacigalupo, Dionne Bucy, MD [facility], [place]   In 6 months [name], MD [place]   In 11 months Diamantina Providence, Herbert Seta, MD [facility] [place]               Requested Prescriptions  Pending Prescriptions Disp Refills   methocarbamol (ROBAXIN) 750 MG tablet [Pharmacy Med Name: METHOCARBAMOL 750 MG TABLET] 90 tablet 0    Sig: TAKE 1 TABLET (750 MG TOTAL) BY MOUTH EVERY 8 (EIGHT) HOURS AS NEEDED FOR MUSCLE SPASMS.     Not Delegated - Analgesics:  Muscle Relaxants Failed - 11/19/2021 11:23 AM      Failed - This refill cannot be delegated      Passed - Valid encounter within last 6 months    Recent  Outpatient Visits           1 month ago [place], Dionne Bucy, MD   4 months ago Chronic sacroiliac joint pain (Bilateral)   [facility] [place], [place], PA-C   7 months ago Encounter for annual physical exam   [facility] [place] Crews, MD   9 months ago Perforation of both tympanic [place] Thedore Mins, [place], PA-C   10 months ago Acute midline low back pain without sciatica   [facility] [place], Jake Church, DO       Future Appointments             In 4 months Bacigalupo, Dionne Bucy, MD [facility], [place]   In 6 months [name], MD [place]   In 11 months Diamantina Providence, Herbert Seta, [place]

## 2021-11-27 DIAGNOSIS — G4733 Obstructive sleep apnea (adult) (pediatric): Secondary | ICD-10-CM | POA: Diagnosis not present

## 2021-11-30 ENCOUNTER — Telehealth: Payer: Self-pay

## 2021-11-30 NOTE — Telephone Encounter (Signed)
Left message on voicemail returning patient's call. He states his ears are sore and bleeding making it difficult to sleep at night and he would like to know if there is a different cream he can use. I see several topical medications (Clobetasol/CeraVe mix, Clobetasol cream, Zoryve cream, and Fluocinolone solution) listed in his last visit and I wanted to know what medications he was using and how.

## 2021-12-07 NOTE — Telephone Encounter (Signed)
Left message on voicemail to return my call.  

## 2021-12-14 ENCOUNTER — Other Ambulatory Visit: Payer: Self-pay | Admitting: Family Medicine

## 2021-12-20 ENCOUNTER — Telehealth: Payer: Self-pay | Admitting: Family Medicine

## 2021-12-20 ENCOUNTER — Other Ambulatory Visit: Payer: Self-pay | Admitting: Family Medicine

## 2021-12-20 DIAGNOSIS — S8411XS Injury of peroneal nerve at lower leg level, right leg, sequela: Secondary | ICD-10-CM

## 2021-12-20 DIAGNOSIS — M792 Neuralgia and neuritis, unspecified: Secondary | ICD-10-CM

## 2021-12-20 DIAGNOSIS — G894 Chronic pain syndrome: Secondary | ICD-10-CM

## 2021-12-20 MED ORDER — PREGABALIN 150 MG PO CAPS: ORAL_CAPSULE | ORAL | 0 refills | Status: DC

## 2021-12-20 NOTE — Telephone Encounter (Signed)
NURSE LINE CALL     Received call from nursing line regarding RX request for Lyrica. Verified as on-call provider for [facility].  Patient reports taking Lyrica '150mg'$  three times daily for chronic nerve pain. Patient reports to nursing line that he has taken his last pill and has to work a shift tonight so he is requesting refill for this medication prior to work on Sunday evening.   PMP reviewed and verified that PCP has prescribed 30 day supply consistently for last three months. Patient's last office visit with PCP was in Oct 2023. Problem list reflects chronic nerve pain.   Will provide 30 day refill for Lyrica '150mg'$  TID with no refills to requested pharmacy[facility].    Eulis Foster, MD  [facility]

## 2022-01-02 ENCOUNTER — Other Ambulatory Visit: Payer: Self-pay | Admitting: Family Medicine

## 2022-01-03 NOTE — Telephone Encounter (Signed)
Requested Prescriptions  Pending Prescriptions Disp Refills   rosuvastatin (CRESTOR) 40 MG tablet [Pharmacy Med Name: ROSUVASTATIN CALCIUM 40 MG TAB] 90 tablet 0    Sig: TAKE 1 TABLET BY MOUTH EVERY DAY     Cardiovascular:  Antilipid - Statins 2 Failed - 01/02/2022  2:31 PM      Failed - Lipid Panel in normal range within the last 12 months    Cholesterol, Total  Date Value Ref Range Status  04/03/2021 112 100 - 199 mg/dL Final   LDL Cholesterol (Calc)  Date Value Ref Range Status  10/21/2016 68 mg/dL (calc) Final    Comment:    Reference range: <100 . Desirable range <100 mg/dL for primary prevention;   <70 mg/dL for patients with CHD or diabetic patients  with > or = 2 CHD risk factors. [place] Kitchen LDL-C is now calculated using the Martin-Hopkins  calculation, which is a validated novel method providing  better accuracy than the Friedewald equation in the  estimation of LDL-C.  Cresenciano Genre et al. Annamaria Helling. 3235;573(22): 2061-2068  (http://education.Quest[employer].com/faq/FAQ164)    LDL Chol Calc (NIH)  Date Value Ref Range Status  04/03/2021 38 0 - 99 mg/dL Final   HDL  Date Value Ref Range Status  04/03/2021 45 >39 mg/dL Final   Triglycerides  Date Value Ref Range Status  04/03/2021 175 (H) 0 - 149 mg/dL Final         Passed - Cr in normal range and within 360 days    Creat  Date Value Ref Range Status  10/21/2016 0.98 0.70 - 1.33 mg/dL Final    Comment:    For patients >59 years of age, the reference limit for Creatinine is approximately 13% higher for people identified as African-American. .    Creatinine, Ser  Date Value Ref Range Status  04/03/2021 0.82 0.76 - 1.27 mg/dL Final         Passed - Patient is not pregnant      Passed - Valid encounter within last 12 months    Recent Outpatient Visits           2 months ago Prediabetes   [facility] [place], Dionne Bucy, MD   6 months ago Chronic sacroiliac joint pain (Bilateral)   [facility] [place], [place], PA-C   9 months ago Encounter for annual physical exam   [facility] Virginia Crews, MD   11 months ago Perforation of both tympanic [place] Thedore Mins, [place], PA-C   11 months ago Acute midline low back pain without sciatica   [facility] Myles Gip, DO       Future Appointments             In 3 months Bacigalupo, Dionne Bucy, MD [facility], [place]   In 4 months [name], MD [place]   In 10 months Diamantina Providence, Herbert Seta, [place]

## 2022-01-06 ENCOUNTER — Other Ambulatory Visit: Payer: Self-pay | Admitting: Family Medicine

## 2022-01-07 NOTE — Telephone Encounter (Signed)
Requested Prescriptions  Pending Prescriptions Disp Refills   losartan (COZAAR) 100 MG tablet [Pharmacy Med Name: LOSARTAN POTASSIUM 100 MG TAB] 90 tablet 0    Sig: TAKE 1 TABLET BY MOUTH EVERY DAY     Cardiovascular:  Angiotensin Receptor Blockers Failed - 01/06/2022  1:17 PM      Failed - Cr in normal range and within 180 days    Creat  Date Value Ref Range Status  10/21/2016 0.98 0.70 - 1.33 mg/dL Final    Comment:    For patients >65 years of age, the reference limit for Creatinine is approximately 13% higher for people identified as African-American. .    Creatinine, Ser  Date Value Ref Range Status  04/03/2021 0.82 0.76 - 1.27 mg/dL Final         Failed - K in normal range and within 180 days    Potassium  Date Value Ref Range Status  04/03/2021 4.0 3.5 - 5.2 mmol/L Final         Passed - Patient is not pregnant      Passed - Last BP in normal range    BP Readings from Last 1 Encounters:  10/28/21 124/80         Passed - Valid encounter within last 6 months    Recent Outpatient Visits           3 months ago Prediabetes   [facility] [place], Dionne Bucy, MD   6 months ago Chronic sacroiliac joint pain (Bilateral)   [facility] [place], [place], PA-C   9 months ago Encounter for annual physical exam   [facility] Virginia Crews, MD   11 months ago Perforation of both tympanic [place], [place], PA-C   11 months ago Acute midline low back pain without sciatica   [facility] [place], Jake Church, DO       Future Appointments             In 2 months Bacigalupo, Dionne Bucy, MD [facility] [place], [place]   In 4 months [name], MD [place]   In 10 months Diamantina [place], Herbert Seta, [place]

## 2022-01-07 NOTE — Telephone Encounter (Signed)
Refilled 05/05/2021 #[id number] [phone] rf. Requested Prescriptions  Pending Prescriptions Disp Refills   isosorbide mononitrate (IMDUR) 30 MG 24 hr tablet [Pharmacy Med Name: ISOSORBIDE MONONIT ER 30 MG TB] 45 tablet 2    Sig: TAKE 1/2 OF A TABLET (15 MG TOTAL) BY MOUTH DAILY     Cardiovascular:  Nitrates Passed - 01/06/2022  1:17 PM      Passed - Last BP in normal range    BP Readings from Last 1 Encounters:  10/28/21 124/80         Passed - Last Heart Rate in normal range    Pulse Readings from Last 1 Encounters:  10/28/21 74         Passed - Valid encounter within last 12 months    Recent Outpatient Visits           3 months ago Prediabetes   [facility] [place], Dionne Bucy, MD   6 months ago Chronic sacroiliac joint pain (Bilateral)   [facility] [place], [place], PA-C   9 months ago Encounter for annual physical exam   [facility] [place], Dionne Bucy, MD   11 months ago Perforation of both tympanic [place], [place], PA-C   11 months ago Acute midline low back pain without sciatica   [place], Jake Church, DO       Future Appointments             In 2 months [place], Dionne Bucy, MD [facility], [place]   In 4 months [name], MD [place]   In 10 months Diamantina [place], Herbert Seta, MD [place]

## 2022-01-13 ENCOUNTER — Other Ambulatory Visit: Payer: Self-pay

## 2022-01-13 MED ORDER — COSENTYX SENSOREADY (300 MG) 150 MG/ML ~~LOC~~ SOAJ: SUBCUTANEOUS | 2 refills | Status: DC

## 2022-01-13 NOTE — Progress Notes (Signed)
Refill request faxed from [facility]. Escripted  

## 2022-01-13 NOTE — Progress Notes (Signed)
ERROR

## 2022-01-16 ENCOUNTER — Other Ambulatory Visit: Payer: Self-pay | Admitting: Cardiovascular Disease

## 2022-01-18 NOTE — Telephone Encounter (Signed)
Pt scheduled for 1/24

## 2022-01-18 NOTE — Telephone Encounter (Signed)
Please schedule 12 month F/U appointment for further refills. Thank you!

## 2022-01-21 ENCOUNTER — Other Ambulatory Visit: Payer: Self-pay | Admitting: Family Medicine

## 2022-01-21 DIAGNOSIS — M792 Neuralgia and neuritis, unspecified: Secondary | ICD-10-CM

## 2022-01-21 DIAGNOSIS — S8411XS Injury of peroneal nerve at lower leg level, right leg, sequela: Secondary | ICD-10-CM

## 2022-01-21 DIAGNOSIS — G894 Chronic pain syndrome: Secondary | ICD-10-CM

## 2022-01-21 MED ORDER — PREGABALIN 150 MG PO CAPS: 150.0000 mg | ORAL_CAPSULE | Freq: Three times a day (TID) | ORAL | 3 refills | Status: DC
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
## 2022-01-26 ENCOUNTER — Encounter: Payer: Self-pay | Admitting: Family Medicine

## 2022-01-26 ENCOUNTER — Ambulatory Visit: Payer: Medicaid Other | Admitting: Family Medicine

## 2022-01-26 VITALS — BP 127/82 | HR 70 | Temp 98.5°F | Resp 16 | Wt 194.0 lb

## 2022-01-26 DIAGNOSIS — M25571 Pain in right ankle and joints of right foot: Secondary | ICD-10-CM

## 2022-01-26 DIAGNOSIS — Z91148 Patient's other noncompliance with medication regimen for other reason: Secondary | ICD-10-CM

## 2022-01-26 DIAGNOSIS — G894 Chronic pain syndrome: Secondary | ICD-10-CM

## 2022-01-26 DIAGNOSIS — G8929 Other chronic pain: Secondary | ICD-10-CM | POA: Diagnosis not present

## 2022-01-26 DIAGNOSIS — Z79891 Long term (current) use of opiate analgesic: Secondary | ICD-10-CM | POA: Diagnosis not present

## 2022-01-26 DIAGNOSIS — M79671 Pain in right foot: Secondary | ICD-10-CM | POA: Diagnosis not present
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
## 2022-01-27 ENCOUNTER — Encounter: Payer: Self-pay | Admitting: Physician Assistant

## 2022-01-27 ENCOUNTER — Ambulatory Visit: Payer: Medicaid Other | Attending: Physician Assistant | Admitting: Physician Assistant

## 2022-01-27 VITALS — BP 126/74 | HR 80 | Ht 68.0 in | Wt 194.0 lb

## 2022-01-27 DIAGNOSIS — E785 Hyperlipidemia, unspecified: Secondary | ICD-10-CM | POA: Diagnosis not present

## 2022-01-27 DIAGNOSIS — I10 Essential (primary) hypertension: Secondary | ICD-10-CM | POA: Diagnosis not present

## 2022-01-27 DIAGNOSIS — I2510 Atherosclerotic heart disease of native coronary artery without angina pectoris: Secondary | ICD-10-CM

## 2022-01-27 DIAGNOSIS — Z87898 Personal history of other specified conditions: Secondary | ICD-10-CM

## 2022-01-27 DIAGNOSIS — J449 Chronic obstructive pulmonary disease, unspecified: Secondary | ICD-10-CM

## 2022-01-27 DIAGNOSIS — Z720 Tobacco use: Secondary | ICD-10-CM

## 2022-01-27 DIAGNOSIS — I493 Ventricular premature depolarization: Secondary | ICD-10-CM | POA: Diagnosis not present

## 2022-01-27 MED ORDER — NITROGLYCERIN 0.4 MG SL SUBL: 0.4000 mg | SUBLINGUAL_TABLET | SUBLINGUAL | 3 refills | Status: DC | PRN

## 2022-01-27 NOTE — Patient Instructions (Signed)
Medication Instructions:  Your physician recommends that you continue on your current medications as directed. Please refer to the Current Medication list given to you today.   *If you need a refill on your cardiac medications before your next appointment, please call your pharmacy*   Lab Work: Your physician recommends that you go to the medical mall to get your labs drawn  If you have labs (blood work) drawn today and your tests are completely normal, you will receive your results only by: MyChart Message (if you have MyChart) OR A paper copy in the mail If you have any lab test that is abnormal or we need to change your treatment, we will call you to review the results.   Testing/Procedures: Your physician has requested that you have an echocardiogram. Echocardiography is a painless test that uses sound waves to create images of your heart. It provides your doctor with information about the size and shape of your heart and how well your heart's chambers and valves are working. This procedure takes approximately one hour. There are no restrictions for this procedure. Please do NOT wear cologne, perfume, aftershave, or lotions (deodorant is allowed). Please arrive 15 minutes prior to your appointment time.    Follow-Up: At [facility], you and your health needs are our priority.  As part of our continuing mission to provide you with exceptional heart care, we have created designated Provider Care Teams.  These Care Teams include your primary Cardiologist (physician) and Advanced Practice Providers (APPs -  Physician Assistants and Nurse Practitioners) who all work together to provide you with the care you need, when you need it.  We recommend signing up for the patient portal called "MyChart".  Sign up information is provided on this After Visit Summary.  MyChart is used to connect with patients for Virtual Visits (Telemedicine).  Patients are able to view lab/test results,  encounter notes, upcoming appointments, etc.  Non-urgent messages can be sent to your provider as well.   To learn more about what you can do with MyChart, go to [email or web address].    Your next appointment:   2 month(s)  Provider:   You may see Ida Rogue, MD or one of the following Advanced Practice Providers on your designated Care Team:   Christell Faith, [place]

## 2022-01-29 ENCOUNTER — Ambulatory Visit: Payer: Medicaid Other | Attending: Physician Assistant

## 2022-01-29 DIAGNOSIS — I2510 Atherosclerotic heart disease of native coronary artery without angina pectoris: Secondary | ICD-10-CM

## 2022-01-29 LAB — ECHOCARDIOGRAM COMPLETE
AR max vel: 2.43 cm2
AV Area VTI: 2.32 cm2
AV Area mean vel: 2.27 cm2
AV Mean grad: 3 mmHg
AV Peak grad: 6.6 mmHg
Ao pk vel: 1.28 m/s
Area-P 1/2: 4.49 cm2
S' Lateral: 4.7 cm

## 2022-01-29 MED ORDER — PERFLUTREN LIPID MICROSPHERE
1.0000 mL | INTRAVENOUS | Status: AC | PRN
  Administered 2022-01-29: 2 mL via INTRAVENOUS

## 2022-01-31 ENCOUNTER — Other Ambulatory Visit: Payer: Self-pay

## 2022-01-31 ENCOUNTER — Emergency Department
Admission: EM | Admit: 2022-01-31 | Discharge: 2022-01-31 | Disposition: A | Discharge status: Home / Self Care | Payer: Medicaid Other | Attending: Emergency Medicine | Admitting: Emergency Medicine

## 2022-01-31 ENCOUNTER — Emergency Department: Payer: Medicaid Other

## 2022-01-31 DIAGNOSIS — I200 Unstable angina: Secondary | ICD-10-CM | POA: Insufficient documentation

## 2022-01-31 DIAGNOSIS — R079 Chest pain, unspecified: Secondary | ICD-10-CM | POA: Diagnosis not present

## 2022-01-31 DIAGNOSIS — J449 Chronic obstructive pulmonary disease, unspecified: Secondary | ICD-10-CM | POA: Diagnosis not present

## 2022-01-31 DIAGNOSIS — R0789 Other chest pain: Secondary | ICD-10-CM | POA: Diagnosis present

## 2022-01-31 DIAGNOSIS — I10 Essential (primary) hypertension: Secondary | ICD-10-CM | POA: Insufficient documentation

## 2022-01-31 DIAGNOSIS — I209 Angina pectoris, unspecified: Secondary | ICD-10-CM | POA: Diagnosis not present

## 2022-01-31 LAB — CBC
HCT: 44.2 % (ref 39.0–52.0)
Hemoglobin: 14.9 g/dL (ref 13.0–17.0)
MCH: 32.3 pg (ref 26.0–34.0)
MCHC: 33.7 g/dL (ref 30.0–36.0)
MCV: 95.9 fL (ref 80.0–100.0)
Platelets: 267 10*3/uL (ref 150–400)
RBC: 4.61 MIL/uL (ref 4.22–5.81)
RDW: 13.2 % (ref 11.5–15.5)
WBC: 13 10*3/uL — ABNORMAL HIGH (ref 4.0–10.5)
nRBC: 0 % (ref 0.0–0.2)

## 2022-01-31 LAB — BASIC METABOLIC PANEL
Anion gap: 11 (ref 5–15)
BUN: 17 mg/dL (ref 8–23)
CO2: 23 mmol/L (ref 22–32)
Calcium: 9.2 mg/dL (ref 8.9–10.3)
Chloride: 101 mmol/L (ref 98–111)
Creatinine, Ser: 0.91 mg/dL (ref 0.61–1.24)
GFR, Estimated: >60 mL/min (ref >60)
Glucose, Bld: 94 mg/dL (ref 70–99)
Potassium: 3.8 mmol/L (ref 3.5–5.1)
Sodium: 135 mmol/L (ref 135–145)

## 2022-01-31 LAB — TROPONIN I (HIGH SENSITIVITY)
Troponin I (High Sensitivity): 9 ng/L (ref <18)
Troponin I (High Sensitivity): 10 ng/L (ref <18)

## 2022-01-31 NOTE — ED Provider Notes (Signed)
[facility] Provider Note    Event Date/Time   First MD Initiated Contact with Patient 01/31/22 1820     (approximate)   History   Chief Complaint: Chest Pain   HPI  Caleb [name] is a 67 y.o. 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  Not doing anything strenuous.  No recent exertional symptoms.  No shortness of breath diaphoresis or vomiting.  He took 1 nitroglycerin and pain resolved.  He is currently asymptomatic.  He has been compliant with all his medications.  Sees cardiology Dr. Rockey Situ.     Physical Exam   Triage Vital Signs: ED Triage Vitals  Enc Vitals Group     BP 01/31/22 1719 112/79     Pulse Rate 01/31/22 1719 84     Resp 01/31/22 1719 16     Temp 01/31/22 1719 97.9 F (36.6 C)     Temp Source 01/31/22 1719 Oral     SpO2 01/31/22 1719 96 %     Weight 01/31/22 1713 194 lb (88 kg)     Height 01/31/22 1713 '5\' 8"'$  (1.727 m)     Head Circumference --      Peak Flow --      Pain Score 01/31/22 1713 7     Pain Loc --      Pain Edu? --      Excl. in [place]? --     Most recent vital signs: Vitals:   01/31/22 1830 01/31/22 1900  BP: (!) 132/92 129/81  Pulse: 76 68  Resp:  19  Temp:    SpO2: 98% 98%    General: Awake, no distress.  CV:  Good peripheral perfusion.  Regular rate rhythm Resp:  Normal effort.  Clear to auscultation bilaterally Abd:  No distention.  Soft nontender Other:  No lower extremity edema.  No calf tenderness.  No intraoral lesions.   ED Results / Procedures / Treatments   Labs (all labs ordered are listed, but only abnormal results are displayed) Labs Reviewed  CBC - Abnormal; Notable for the following components:      Result Value   WBC 13.0 (*)    All other components within normal limits  BASIC METABOLIC PANEL  TROPONIN I (HIGH  SENSITIVITY)  TROPONIN I (HIGH SENSITIVITY)     EKG Interpreted by me Sinus rhythm rate of 86.  Normal axis, normal intervals.  Normal QRS ST segments and T waves.  No ischemic changes.  1 PVC on the strip.   RADIOLOGY Chest x-ray interpreted by me, appears unremarkable.  Radiology report reviewed.   PROCEDURES:  Procedures   MEDICATIONS ORDERED IN ED: Medications - No data to display   IMPRESSION / MDM / [place] / ED COURSE  I reviewed the triage vital signs and the nursing notes.  DDx: Angina pectoris, NSTEMI, GERD, muscle spasm, electrolyte abnormality  Patient's presentation is most consistent with acute presentation with potential threat to life or bodily function.  Patient presents with symptoms suggestive of angina.  He took his prescribed nitroglycerin with immediate resolution of the symptoms.  Vital signs are unremarkable.  Exam is benign.  EKG and chest x-ray and initial troponin are all unremarkable.  Will obtain delta troponin.   ----------------------------------------- 8:00 PM on 01/31/2022 ----------------------------------------- Repeat troponin normal.  Remains  asymptomatic.  Does not require admission, stable for discharge to follow-up outpatient with cardiology.      FINAL CLINICAL IMPRESSION(S) / ED DIAGNOSES   Final diagnoses:  Angina pectoris ([place])     Rx / DC Orders   ED Discharge Orders     None        Note:  This document was prepared using Dragon voice recognition software and may include unintentional dictation errors.   Carrie Mew, MD 01/31/22 2000

## 2022-01-31 NOTE — Discharge Instructions (Signed)
Your EKG and labs today were all okay. Please follow up with your cardiology office for further evaluation of your symptoms.

## 2022-01-31 NOTE — ED Triage Notes (Signed)
Pt to ED for CP. Left sided down his arm and into his j aw. Pt states he had a MI in 2015 and this feels the same. Pt had an ECHO done on Friday for irregular HR. Pt is CAOx4 and ambulatory in triage.

## 2022-02-02 DIAGNOSIS — M159 Polyosteoarthritis, unspecified: Secondary | ICD-10-CM | POA: Diagnosis not present

## 2022-02-02 DIAGNOSIS — L400 Psoriasis vulgaris: Secondary | ICD-10-CM | POA: Diagnosis not present

## 2022-02-02 DIAGNOSIS — Z7960 Long term (current) use of unspecified immunomodulators and immunosuppressants: Secondary | ICD-10-CM | POA: Diagnosis not present

## 2022-02-02 DIAGNOSIS — L4050 Arthropathic psoriasis, unspecified: Secondary | ICD-10-CM | POA: Diagnosis not present

## 2022-02-03 ENCOUNTER — Telehealth: Payer: Self-pay

## 2022-02-03 NOTE — Telephone Encounter (Signed)
Copied from [place] [id number]. Topic: General - Inquiry >> Feb 03, 2022 11:16 AM [name] wrote: Reason for CRM: Pt stated he had an echocardiogram done, and the results did not look too good. Stated that on Sunday, 01/31/2022, he went to the ED for severe chest pains.However, he will be He will be seeing his cardiologist this upcoming Friday. Pt stated he just wanted to call and make Dr.B aware.  Please advise.

## 2022-02-04 ENCOUNTER — Telehealth: Payer: Self-pay

## 2022-02-04 NOTE — Telephone Encounter (Signed)
Noted  

## 2022-02-04 NOTE — Telephone Encounter (Signed)
Copied from [place] [phone]. Topic: General - Inquiry >> Feb 04, 2022 12:09 PM [name] wrote: Reason for CRM: Maudie Mercury from [facility] Stated they have reviewed referral Unable to accept referral at this time. Do not typically accept other pain management without a reasonable explanation.   Please advise.

## 2022-02-04 NOTE — Telephone Encounter (Signed)
Can the referral be sent elsewhere?

## 2022-02-05 ENCOUNTER — Encounter: Payer: Self-pay | Admitting: Medical

## 2022-02-05 ENCOUNTER — Telehealth: Payer: Self-pay

## 2022-02-05 ENCOUNTER — Ambulatory Visit: Payer: Medicaid Other | Attending: Medical | Admitting: Medical

## 2022-02-05 VITALS — BP 124/90 | HR 68 | Ht 68.0 in | Wt 194.4 lb

## 2022-02-05 DIAGNOSIS — I10 Essential (primary) hypertension: Secondary | ICD-10-CM

## 2022-02-05 DIAGNOSIS — I25110 Atherosclerotic heart disease of native coronary artery with unstable angina pectoris: Secondary | ICD-10-CM | POA: Diagnosis not present

## 2022-02-05 DIAGNOSIS — E782 Mixed hyperlipidemia: Secondary | ICD-10-CM | POA: Diagnosis not present

## 2022-02-05 DIAGNOSIS — I200 Unstable angina: Secondary | ICD-10-CM | POA: Diagnosis not present
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
## 2022-02-05 NOTE — Telephone Encounter (Signed)
Patient was scheduled for a cardiac cath at 10 am on 02/08/2021. Unable to reach Caleb [name] to schedule so patient has been rescheduled to Tuesday Feb 09, 2022 at 10 am. Patient was agreeable to this and is aware to go to the [facility] at 9 am for registration. Will F/U with scheduling on Monday 02/08/22 to confirm.

## 2022-02-05 NOTE — Patient Instructions (Addendum)
Medication Instructions:  Your physician recommends that you continue on your current medications as directed. Please refer to the Current Medication list given to you today.  *If you need a refill on your cardiac medications before your next appointment, please call your pharmacy*   Lab Work: No labs ordered today  If you have labs (blood work) drawn today and your tests are completely normal, you will receive your results only by: [place] (if you have MyChart) OR A paper copy in the mail If you have any lab test that is abnormal or we need to change your treatment, we will call you to review the results.   Testing/Procedures:   [facility] [place] [address] Dept: [phone] Loc: [phone]   Cardiac/Peripheral Catheterization   You are scheduled for a Cardiac Catheterization on Tuesday, February 6 with Dr. Harrell Gave End.  1. Arrive at the [facility] entrance at 9:00 AM, one hour prior to your procedure. Free valet service is available.  After entering the [facility] please check-in at the registration desk (1st desk on your right) to receive your armband. After receiving your armband someone will escort you to the cardiac cath/special procedures waiting area. The support person will be asked to wait in the waiting room.  It is OK to have someone drop you off and come back when you are ready to be discharged.        Special note: Every effort is made to have your procedure done on time. Please understand that emergencies sometimes delay scheduled procedures.   . 2. Diet: Do not eat solid foods after midnight.  You may have clear liquids until 5 AM the day of the procedure.  3. Labs: Drawn on 01/31/22  4. Medication instructions in preparation for your procedure:   Contrast Allergy: No  On the morning of your procedure, take Aspirin 81 mg and any morning medicines NOT listed above.  You may use sips of  water.  5. Plan to go home the same day, you will only stay overnight if medically necessary. 6. You MUST have a responsible adult to drive you home. 7. An adult MUST be with you the first 24 hours after you arrive home. 8. Bring a current list of your medications, and the last time and date medication taken. 9. Bring ID and current insurance cards. 10.Please wear clothes that are easy to get on and off and wear slip-on shoes.  Thank you for allowing [place] to care for you!   -- [place] [facility]   Follow-Up: At [facility] you and your health needs are our priority.  As part of our continuing mission to provide you with exceptional heart care, we have created designated Provider Care Teams.  These Care Teams include your primary Cardiologist (physician) and Advanced Practice Providers (APPs -  Physician Assistants and Nurse Practitioners) who all work together to provide you with the care you need, when you need it.  We recommend signing up for the patient portal called "MyChart".  Sign up information is provided on this After Visit Summary.  MyChart is used to connect with patients for Virtual Visits (Telemedicine).  Patients are able to view lab/test results, encounter notes, upcoming appointments, etc.  Non-urgent messages can be sent to your provider as well.   To learn more about what you can do with MyChart, go to [email or web address].    Your next appointment:   2 week(s) after cardiac cath  Provider:   You may see Ida Rogue, MD or one of the following Advanced Practice Providers on your designated Care Team:   Murray Hodgkins, NP Christell Faith, PA-C Cadence Kathlen Mody, PA-C Gerrie Nordmann, NP
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
## 2022-02-09 ENCOUNTER — Other Ambulatory Visit: Payer: Self-pay

## 2022-02-09 ENCOUNTER — Other Ambulatory Visit: Payer: Self-pay | Admitting: Cardiovascular Disease

## 2022-02-09 ENCOUNTER — Ambulatory Visit
Admission: RE | Admit: 2022-02-09 | Discharge: 2022-02-09 | Disposition: A | Discharge status: Home / Self Care | Payer: Medicaid Other | Attending: Internal Medicine | Admitting: Internal Medicine

## 2022-02-09 ENCOUNTER — Other Ambulatory Visit: Payer: Self-pay | Admitting: Family Medicine

## 2022-02-09 ENCOUNTER — Encounter: Payer: Self-pay | Admitting: Internal Medicine

## 2022-02-09 ENCOUNTER — Encounter
Admission: RE | Disposition: A | Discharge status: Home / Self Care | Payer: Self-pay | Source: Home / Self Care | Attending: Internal Medicine

## 2022-02-09 DIAGNOSIS — I472 Ventricular tachycardia, unspecified: Secondary | ICD-10-CM | POA: Diagnosis not present

## 2022-02-09 DIAGNOSIS — F17210 Nicotine dependence, cigarettes, uncomplicated: Secondary | ICD-10-CM | POA: Insufficient documentation

## 2022-02-09 DIAGNOSIS — I200 Unstable angina: Secondary | ICD-10-CM | POA: Diagnosis present

## 2022-02-09 DIAGNOSIS — I110 Hypertensive heart disease with heart failure: Secondary | ICD-10-CM | POA: Insufficient documentation

## 2022-02-09 DIAGNOSIS — G4733 Obstructive sleep apnea (adult) (pediatric): Secondary | ICD-10-CM | POA: Insufficient documentation

## 2022-02-09 DIAGNOSIS — Z8249 Family history of ischemic heart disease and other diseases of the circulatory system: Secondary | ICD-10-CM | POA: Diagnosis not present

## 2022-02-09 DIAGNOSIS — I509 Heart failure, unspecified: Secondary | ICD-10-CM | POA: Insufficient documentation

## 2022-02-09 DIAGNOSIS — E782 Mixed hyperlipidemia: Secondary | ICD-10-CM | POA: Diagnosis not present

## 2022-02-09 DIAGNOSIS — Z5986 Financial insecurity: Secondary | ICD-10-CM | POA: Insufficient documentation

## 2022-02-09 DIAGNOSIS — R079 Chest pain, unspecified: Secondary | ICD-10-CM

## 2022-02-09 DIAGNOSIS — I25110 Atherosclerotic heart disease of native coronary artery with unstable angina pectoris: Secondary | ICD-10-CM | POA: Diagnosis not present

## 2022-02-09 HISTORY — PX: CORONARY PRESSURE/FFR STUDY: CATH118243

## 2022-02-09 HISTORY — PX: LEFT HEART CATH AND CORONARY ANGIOGRAPHY: CATH118249

## 2022-02-09 HISTORY — PX: INTRAVASCULAR PRESSURE WIRE/FFR STUDY: CATH118243

## 2022-02-09 SURGERY — LEFT HEART CATH AND CORONARY ANGIOGRAPHY
Anesthesia: Moderate Sedation | Laterality: Right

## 2022-02-09 MED ORDER — SODIUM CHLORIDE 0.9% FLUSH: 3.0000 mL | Freq: Two times a day (BID) | INTRAVENOUS | Status: DC

## 2022-02-09 MED ORDER — SODIUM CHLORIDE 0.9% FLUSH: 3.0000 mL | INTRAVENOUS | Status: DC | PRN

## 2022-02-09 MED ORDER — SODIUM CHLORIDE 0.9 % IV SOLN: INTRAVENOUS | Status: AC

## 2022-02-09 MED ORDER — ACETAMINOPHEN 325 MG PO TABS: 650.0000 mg | ORAL_TABLET | ORAL | Status: DC | PRN

## 2022-02-09 MED ORDER — VERAPAMIL HCL 2.5 MG/ML IV SOLN
INTRAVENOUS | Status: DC | PRN
  Administered 2022-02-09: 2.5 mg via INTRAVENOUS

## 2022-02-09 MED ORDER — FENTANYL CITRATE (PF) 100 MCG/2ML IJ SOLN
INTRAMUSCULAR | Status: AC
  Filled 2022-02-09: qty 2

## 2022-02-09 MED ORDER — VERAPAMIL HCL 2.5 MG/ML IV SOLN
INTRAVENOUS | Status: AC
  Filled 2022-02-09: qty 2

## 2022-02-09 MED ORDER — FENTANYL CITRATE (PF) 100 MCG/2ML IJ SOLN
INTRAMUSCULAR | Status: DC | PRN
  Administered 2022-02-09: 25 ug via INTRAVENOUS

## 2022-02-09 MED ORDER — ONDANSETRON HCL 4 MG/2ML IJ SOLN: 4.0000 mg | Freq: Four times a day (QID) | INTRAMUSCULAR | Status: DC | PRN

## 2022-02-09 MED ORDER — LIDOCAINE HCL (PF) 1 % IJ SOLN
INTRAMUSCULAR | Status: DC | PRN
  Administered 2022-02-09: 2 mL

## 2022-02-09 MED ORDER — HYDRALAZINE HCL 20 MG/ML IJ SOLN: 10.0000 mg | INTRAMUSCULAR | Status: DC | PRN

## 2022-02-09 MED ORDER — SODIUM CHLORIDE 0.9 % WEIGHT BASED INFUSION
1.0000 mL/kg/h | INTRAVENOUS | Status: DC
  Administered 2022-02-09: 1 mL/kg/h via INTRAVENOUS

## 2022-02-09 MED ORDER — NITROGLYCERIN 1 MG/10 ML FOR IR/CATH LAB
INTRA_ARTERIAL | Status: AC
  Filled 2022-02-09: qty 10

## 2022-02-09 MED ORDER — MIDAZOLAM HCL 2 MG/2ML IJ SOLN
INTRAMUSCULAR | Status: DC | PRN
  Administered 2022-02-09: 1 mg via INTRAVENOUS

## 2022-02-09 MED ORDER — SODIUM CHLORIDE 0.9 % WEIGHT BASED INFUSION
3.0000 mL/kg/h | INTRAVENOUS | Status: AC
  Administered 2022-02-09: 3 mL/kg/h via INTRAVENOUS

## 2022-02-09 MED ORDER — LABETALOL HCL 5 MG/ML IV SOLN: 10.0000 mg | INTRAVENOUS | Status: DC | PRN

## 2022-02-09 MED ORDER — HEPARIN (PORCINE) IN NACL 1000-0.9 UT/500ML-% IV SOLN
INTRAVENOUS | Status: AC
  Filled 2022-02-09: qty 1000

## 2022-02-09 MED ORDER — ASPIRIN 81 MG PO CHEW: 81.0000 mg | CHEWABLE_TABLET | ORAL | Status: AC

## 2022-02-09 MED ORDER — AMLODIPINE BESYLATE 2.5 MG PO TABS: 2.5000 mg | ORAL_TABLET | Freq: Every day | ORAL | 11 refills | Status: DC

## 2022-02-09 MED ORDER — IOHEXOL 300 MG/ML  SOLN
INTRAMUSCULAR | Status: DC | PRN
  Administered 2022-02-09: 56 mL

## 2022-02-09 MED ORDER — SODIUM CHLORIDE 0.9 % IV SOLN: 250.0000 mL | INTRAVENOUS | Status: DC | PRN

## 2022-02-09 MED ORDER — HEPARIN SODIUM (PORCINE) 1000 UNIT/ML IJ SOLN
INTRAMUSCULAR | Status: DC | PRN
  Administered 2022-02-09 (×2): 4500 [IU] via INTRAVENOUS

## 2022-02-09 MED ORDER — HEPARIN SODIUM (PORCINE) 1000 UNIT/ML IJ SOLN
INTRAMUSCULAR | Status: AC
  Filled 2022-02-09: qty 10

## 2022-02-09 MED ORDER — HEPARIN (PORCINE) IN NACL 1000-0.9 UT/500ML-% IV SOLN
INTRAVENOUS | Status: DC | PRN
  Administered 2022-02-09 (×2): 500 mL

## 2022-02-09 MED ORDER — MIDAZOLAM HCL 2 MG/2ML IJ SOLN
INTRAMUSCULAR | Status: AC
  Filled 2022-02-09: qty 2

## 2022-02-09 MED ORDER — ASPIRIN 81 MG PO CHEW
CHEWABLE_TABLET | ORAL | Status: AC
  Administered 2022-02-09: 81 mg via ORAL
  Filled 2022-02-09: qty 1

## 2022-02-09 SURGICAL SUPPLY — 15 items
CATH 5FR JL3.5 JR4 ANG PIG MP (CATHETERS) IMPLANT
CATH LAUNCHER 6FR EBU3.5 (CATHETERS) IMPLANT
DEVICE RAD TR BAND REGULAR (VASCULAR PRODUCTS) IMPLANT
DRAPE BRACHIAL (DRAPES) IMPLANT
GLIDESHEATH SLEND SS 6F .021 (SHEATH) IMPLANT
GUIDEWIRE INQWIRE 1.5J.035X260 (WIRE) IMPLANT
GUIDEWIRE PRESSURE X 175 (WIRE) IMPLANT
INQWIRE 1.5J .035X260CM (WIRE) ×2
PACK CARDIAC CATH (CUSTOM PROCEDURE TRAY) ×2 IMPLANT
PROTECTION STATION PRESSURIZED (MISCELLANEOUS) ×2
SET ATX SIMPLICITY (MISCELLANEOUS) IMPLANT
STATION PROTECTION PRESSURIZED (MISCELLANEOUS) IMPLANT
TUBING CIL FLEX 10 FLL-RA (TUBING) IMPLANT
VALVE COPILOT STAT (MISCELLANEOUS) IMPLANT
WIRE G INSERTION TOOL (WIRE) IMPLANT

## 2022-02-09 NOTE — Interval H&P Note (Signed)
History and Physical Interval Note:  02/09/2022 10:46 AM  Caleb [name]  has presented today for surgery, with the diagnosis of unstable angina.  The various methods of treatment have been discussed with the patient and family. After consideration of risks, benefits and other options for treatment, the patient has consented to  Procedure(s): LEFT HEART CATH AND CORONARY ANGIOGRAPHY (Left) as a surgical intervention.  The patient's history has been reviewed, patient examined, no change in status, stable for surgery.  I have reviewed the patient's chart and labs.  Questions were answered to the patient's satisfaction.    Cath Lab Visit (complete for each Cath Lab visit)  Clinical Evaluation Leading to the Procedure:   ACS: No.  Non-ACS:    Anginal Classification: CCS IV  Anti-ischemic medical therapy: Minimal Therapy (1 class of medications)  Non-Invasive Test Results: Intermediate-risk stress test findings: cardiac mortality 1-3%/year (2022)  Prior CABG: No previous CABG  Caleb [name]

## 2022-02-09 NOTE — Brief Op Note (Signed)
BRIEF CARDIAC CATHETERIZATION NOTE  02/09/2022  11:38 AM  PATIENT:  Caleb [name]  67 y.o. male  PRE-OPERATIVE DIAGNOSIS:  Unstable angina  POST-OPERATIVE DIAGNOSIS:  Same  PROCEDURE:  Procedure(s): LEFT HEART CATH AND CORONARY ANGIOGRAPHY (Left) INTRAVASCULAR PRESSURE WIRE/FFR STUDY (Right)  SURGEON:  Surgeon(s) and Role:    * Rylin Seavey, MD - Primary  FINDINGS: Severe single-vessel CAD with CTO of small distal RCA. Moderate proximal/mid LAD disease (RFR 0.93) and mid/distal LCx ISR (RFR 0.95). Mildly reduced LVEF (45-50%). Normal LVEDP (10 mmHg).  RECOMMENDATIONS: Aggressive secondary prevention and antianginal therapy.  Will add amlodipine 2.5 mg daily; continue isosorbide mononitrate.

## 2022-02-10 ENCOUNTER — Encounter: Payer: Self-pay | Admitting: Internal Medicine

## 2022-02-10 LAB — POCT ACTIVATED CLOTTING TIME: Activated Clotting Time: 282 seconds

## 2022-02-11 DIAGNOSIS — M47812 Spondylosis without myelopathy or radiculopathy, cervical region: Secondary | ICD-10-CM | POA: Diagnosis not present

## 2022-02-11 DIAGNOSIS — E559 Vitamin D deficiency, unspecified: Secondary | ICD-10-CM | POA: Diagnosis not present

## 2022-02-11 DIAGNOSIS — M129 Arthropathy, unspecified: Secondary | ICD-10-CM | POA: Diagnosis not present

## 2022-02-11 DIAGNOSIS — Z79899 Other long term (current) drug therapy: Secondary | ICD-10-CM | POA: Diagnosis not present

## 2022-02-11 DIAGNOSIS — Z6829 Body mass index (BMI) 29.0-29.9, adult: Secondary | ICD-10-CM | POA: Diagnosis not present

## 2022-02-11 DIAGNOSIS — G894 Chronic pain syndrome: Secondary | ICD-10-CM | POA: Diagnosis not present

## 2022-02-11 DIAGNOSIS — M79671 Pain in right foot: Secondary | ICD-10-CM | POA: Diagnosis not present

## 2022-02-11 LAB — BASIC METABOLIC PANEL
BUN: 19 (ref 4–21)
CO2: 26 — AB (ref 13–22)
Chloride: 102 (ref 99–108)
Creatinine: 0.8 (ref 0.6–1.3)
Glucose: 93
Potassium: 4 mEq/L (ref 3.5–5.1)
Sodium: 140 (ref 137–147)

## 2022-02-11 LAB — COMPREHENSIVE METABOLIC PANEL
Albumin: 4.1 (ref 3.5–5.0)
Globulin: 2.9
eGFR: 121

## 2022-02-11 LAB — HEPATIC FUNCTION PANEL
ALT: 17 U/L (ref 10–40)
AST: 18 (ref 14–40)
Alkaline Phosphatase: 64 (ref 25–125)
Bilirubin, Total: 0.3

## 2022-02-11 LAB — CBC AND DIFFERENTIAL
HCT: 43 (ref 41–53)
Hemoglobin: 14.8 (ref 13.5–17.5)
Neutrophils Absolute: 69.5
Platelets: 270 10*3/uL (ref 150–400)
WBC: 12.1

## 2022-02-11 LAB — CBC: RBC: 4.47 (ref 3.87–5.11)

## 2022-02-11 NOTE — Addendum Note (Signed)
Addended by: James Ivanoff D on: 02/11/2022 12:40 PM   Modules accepted: Orders

## 2022-02-12 ENCOUNTER — Other Ambulatory Visit: Payer: Self-pay | Admitting: Family Medicine

## 2022-02-12 ENCOUNTER — Telehealth: Payer: Self-pay | Admitting: Internal Medicine

## 2022-02-12 NOTE — Telephone Encounter (Signed)
Pt c/o of Chest Pain: STAT if CP now or developed within 24 hours  1. Are you having CP right now? No   2. Are you experiencing any other symptoms (ex. SOB, nausea, vomiting, sweating)? Heartbeat was irregular yesterday at pain dr   3. How long have you been experiencing CP? Occurred last night   4. Is your CP continuous or coming and going? continuous  5. Have you taken Nitroglycerin? No  ?   Lasted about 15 minutes. Transferring to RN due to developing in the last 24 hrs.

## 2022-02-12 NOTE — Telephone Encounter (Signed)
Spoke with patient and patient denies active dyspnea at rest, crushing chest pain radiating to jaw, shoulder. Patient denies syncope, dizziness, nausea, vomiting and clammy skin. Patient admits to having anxiety surrounding cardiac cath procedure recently and is anxious for follow-up. Patient was scheduled for follow-up s/p cardiac cath on 02/22/22 but has been rescheduled to 02/18/22. Patient was grateful for earlier appointment.

## 2022-02-12 NOTE — Telephone Encounter (Signed)
Requested Prescriptions  Pending Prescriptions Disp Refills   isosorbide mononitrate (IMDUR) 30 MG 24 hr tablet [Pharmacy Med Name: ISOSORBIDE MONONIT ER 30 MG TB] 45 tablet 2    Sig: TAKE 1/2 OF A TABLET (15 MG TOTAL) BY MOUTH DAILY     Cardiovascular:  Nitrates Failed - 02/12/2022 12:44 PM      Failed - Last BP in normal range    BP Readings from Last 1 Encounters:  02/09/22 (!) 137/90         Passed - Last Heart Rate in normal range    Pulse Readings from Last 1 Encounters:  02/09/22 71         Passed - Valid encounter within last 12 months    Recent Outpatient Visits           2 weeks ago Chronic foot pain (1ry area of Pain) (Right)   [place], Dionne Bucy, MD   4 months ago Prediabetes   [place], Dionne Bucy, MD   7 months ago Chronic sacroiliac joint pain (Bilateral)   [place], [place], PA-C   10 months ago Encounter for annual physical exam   [facility] [place], Dionne Bucy, MD   1 year ago Perforation of both tympanic membranes   [place] [name], PA-C       Future Appointments             In 1 week Kathlen Mody, Cadence H, PA-C [place] [facility] [place]   In 1 month Dunn, Areta Haber, PA-C [place] [facility] [place]   In 1 month Bacigalupo, Dionne Bucy, MD [facility] [place]   In 3 months [name], MD [place]   In 8 months Diam[place], Herbert Seta, [place]

## 2022-02-13 ENCOUNTER — Other Ambulatory Visit: Payer: Self-pay | Admitting: Family Medicine

## 2022-02-18 ENCOUNTER — Ambulatory Visit: Payer: Medicaid Other | Attending: Nurse Practitioner | Admitting: Nurse Practitioner

## 2022-02-18 ENCOUNTER — Encounter: Payer: Self-pay | Admitting: Nurse Practitioner

## 2022-02-18 VITALS — BP 124/74 | HR 68 | Ht 68.0 in | Wt 196.8 lb

## 2022-02-18 DIAGNOSIS — I493 Ventricular premature depolarization: Secondary | ICD-10-CM | POA: Diagnosis not present

## 2022-02-18 DIAGNOSIS — Z720 Tobacco use: Secondary | ICD-10-CM | POA: Diagnosis not present

## 2022-02-18 DIAGNOSIS — I255 Ischemic cardiomyopathy: Secondary | ICD-10-CM

## 2022-02-18 DIAGNOSIS — I25110 Atherosclerotic heart disease of native coronary artery with unstable angina pectoris: Secondary | ICD-10-CM | POA: Diagnosis not present

## 2022-02-18 DIAGNOSIS — E782 Mixed hyperlipidemia: Secondary | ICD-10-CM | POA: Diagnosis not present

## 2022-02-18 DIAGNOSIS — J449 Chronic obstructive pulmonary disease, unspecified: Secondary | ICD-10-CM

## 2022-02-18 DIAGNOSIS — I10 Essential (primary) hypertension: Secondary | ICD-10-CM
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
## 2022-02-18 NOTE — Patient Instructions (Signed)
Medication Instructions:  No changes at this time.   *If you need a refill on your cardiac medications before your next appointment, please call your pharmacy*   Lab Work: None  If you have labs (blood work) drawn today and your tests are completely normal, you will receive your results only by: [place] (if you have MyChart) OR A paper copy in the mail If you have any lab test that is abnormal or we need to change your treatment, we will call you to review the results.   Testing/Procedures: None   Follow-Up: At [facility], you and your health needs are our priority.  As part of our continuing mission to provide you with exceptional heart care, we have created designated Provider Care Teams.  These Care Teams include your primary Cardiologist (physician) and Advanced Practice Providers (APPs -  Physician Assistants and Nurse Practitioners) who all work together to provide you with the care you need, when you need it.  Your next appointment:   3 month(s)  Provider:   Ida Rogue, MD or Murray Hodgkins, NP

## 2022-02-22 ENCOUNTER — Encounter: Payer: Self-pay | Admitting: Family Medicine

## 2022-02-22 ENCOUNTER — Ambulatory Visit: Payer: Medicaid Other | Admitting: Medical

## 2022-02-25 DIAGNOSIS — Z79899 Other long term (current) drug therapy: Secondary | ICD-10-CM | POA: Diagnosis not present

## 2022-02-25 DIAGNOSIS — G894 Chronic pain syndrome: Secondary | ICD-10-CM | POA: Diagnosis not present

## 2022-02-25 DIAGNOSIS — M79671 Pain in right foot: Secondary | ICD-10-CM | POA: Diagnosis not present

## 2022-02-25 DIAGNOSIS — Z6829 Body mass index (BMI) 29.0-29.9, adult: Secondary | ICD-10-CM | POA: Diagnosis not present

## 2022-02-25 DIAGNOSIS — M47812 Spondylosis without myelopathy or radiculopathy, cervical region: Secondary | ICD-10-CM | POA: Diagnosis not present

## 2022-02-25 DIAGNOSIS — G8929 Other chronic pain: Secondary | ICD-10-CM | POA: Diagnosis not present

## 2022-03-12 DIAGNOSIS — M542 Cervicalgia: Secondary | ICD-10-CM | POA: Diagnosis not present

## 2022-03-12 DIAGNOSIS — M546 Pain in thoracic spine: Secondary | ICD-10-CM | POA: Diagnosis not present

## 2022-03-15 ENCOUNTER — Ambulatory Visit: Payer: Medicaid Other | Admitting: Family Medicine

## 2022-03-15 ENCOUNTER — Encounter: Payer: Self-pay | Admitting: Family Medicine

## 2022-03-15 VITALS — BP 110/72 | HR 67 | Temp 98.7°F | Resp 24 | Wt 195.7 lb

## 2022-03-15 DIAGNOSIS — Z720 Tobacco use: Secondary | ICD-10-CM | POA: Diagnosis not present

## 2022-03-15 DIAGNOSIS — G4733 Obstructive sleep apnea (adult) (pediatric): Secondary | ICD-10-CM | POA: Diagnosis not present

## 2022-03-15 DIAGNOSIS — F331 Major depressive disorder, recurrent, moderate: Secondary | ICD-10-CM | POA: Diagnosis not present

## 2022-03-15 DIAGNOSIS — J449 Chronic obstructive pulmonary disease, unspecified: Secondary | ICD-10-CM | POA: Diagnosis not present

## 2022-03-15 DIAGNOSIS — R054 Cough syncope: Secondary | ICD-10-CM | POA: Diagnosis not present

## 2022-03-15 DIAGNOSIS — R55 Syncope and collapse: Secondary | ICD-10-CM | POA: Diagnosis not present

## 2022-03-15 MED ORDER — SERTRALINE HCL 50 MG PO TABS: 50.0000 mg | ORAL_TABLET | Freq: Every day | ORAL | 3 refills | Status: DC

## 2022-03-15 MED ORDER — BUPROPION HCL ER (XL) 150 MG PO TB24: 150.0000 mg | ORAL_TABLET | Freq: Every day | ORAL | 5 refills | Status: DC

## 2022-03-15 NOTE — Assessment & Plan Note (Signed)
Chronic and uncontrolled Desires cessation Failed chantix previously Trial of wellbutrin

## 2022-03-15 NOTE — Assessment & Plan Note (Signed)
Recurrent issue Uncontrolled Will Start zoloft 50 mg daily Discussed potential side effects Discussed that it can take 6-8 weeks to reach full efficacy Contracted for safety - no SI/HI Discussed synergistic effects of medications and therapy

## 2022-03-15 NOTE — Assessment & Plan Note (Signed)
No current exacerbation, but cough is worsening Will f/u with Pulm We will work on tobacco cessation

## 2022-03-15 NOTE — Patient Instructions (Signed)
Schedule a follow up appt with Pulmonology

## 2022-03-15 NOTE — Assessment & Plan Note (Signed)
Had great improvement in symptoms with Bipap usage, but having difficulty tolerating due to mouth dryness Will f/u with Pulm
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
## 2022-03-15 NOTE — Assessment & Plan Note (Signed)
Uncontrolled 2 near syncopal episodes in the last few weeks Will work with pulmonology Tobacco cessation and good control of COPD and OSA will help

## 2022-03-17 ENCOUNTER — Other Ambulatory Visit: Payer: Self-pay

## 2022-03-17 MED ORDER — COSENTYX SENSOREADY (300 MG) 150 MG/ML ~~LOC~~ SOAJ: SUBCUTANEOUS | 2 refills | Status: DC

## 2022-03-17 NOTE — Progress Notes (Signed)
Refill request faxed from [facility]. Escripted  

## 2022-03-23 ENCOUNTER — Other Ambulatory Visit: Payer: Self-pay | Admitting: Family Medicine

## 2022-03-23 DIAGNOSIS — G8929 Other chronic pain: Secondary | ICD-10-CM

## 2022-03-24 NOTE — Telephone Encounter (Signed)
Requested medication (s) are due for refill today: Yes  Requested medication (s) are on the active medication list: Yes  Last refill:  11/19/21  Future visit scheduled: Yes  Notes to clinic:  See request.    Requested Prescriptions  Pending Prescriptions Disp Refills   methocarbamol (ROBAXIN) 750 MG tablet [Pharmacy Med Name: METHOCARBAMOL 750 MG TABLET] 90 tablet 0    Sig: TAKE 1 TABLET (750 MG TOTAL) BY MOUTH EVERY 8 (EIGHT) HOURS AS NEEDED FOR MUSCLE SPASMS.     Not Delegated - Analgesics:  Muscle Relaxants Failed - 03/23/2022 10:26 AM      Failed - This refill cannot be delegated      Passed - Valid encounter within last 6 months    Recent Outpatient Visits           1 week ago Cough syncope   [facility] [place], Dionne Bucy, MD   1 month ago Chronic foot pain (1ry area of Pain) (Right)   [facility] [place], Dionne Bucy, MD   5 months ago Prediabetes   [place] Crews, MD   8 months ago Chronic sacroiliac joint pain (Bilateral)   [place], [place], PA-C   11 months ago Encounter for annual physical exam   [facility], Dionne Bucy, MD       Future Appointments             In 1 week Bacigalupo, Dionne Bucy, MD [facility], Derm[facility]   In 1 month [name], [place]   In 1 month Sharolyn Douglas, Clance Boll, NP [place] [facility] [place]   In 7 months Diamantina [place], Herbert Seta, [place]

## 2022-03-25 DIAGNOSIS — G894 Chronic pain syndrome: Secondary | ICD-10-CM | POA: Diagnosis not present

## 2022-03-25 DIAGNOSIS — M79671 Pain in right foot: Secondary | ICD-10-CM | POA: Diagnosis not present

## 2022-03-25 DIAGNOSIS — Z79899 Other long term (current) drug therapy: Secondary | ICD-10-CM | POA: Diagnosis not present

## 2022-03-25 DIAGNOSIS — G8929 Other chronic pain: Secondary | ICD-10-CM | POA: Diagnosis not present

## 2022-03-25 DIAGNOSIS — M47812 Spondylosis without myelopathy or radiculopathy, cervical region: Secondary | ICD-10-CM | POA: Diagnosis not present

## 2022-03-25 DIAGNOSIS — Z6829 Body mass index (BMI) 29.0-29.9, adult: Secondary | ICD-10-CM | POA: Diagnosis not present

## 2022-03-26 DIAGNOSIS — G4733 Obstructive sleep apnea (adult) (pediatric): Secondary | ICD-10-CM | POA: Diagnosis not present

## 2022-03-26 DIAGNOSIS — M47812 Spondylosis without myelopathy or radiculopathy, cervical region: Secondary | ICD-10-CM | POA: Diagnosis not present

## 2022-03-29 ENCOUNTER — Ambulatory Visit: Payer: Medicaid Other | Admitting: Physician Assistant

## 2022-03-29 DIAGNOSIS — M5412 Radiculopathy, cervical region: Secondary | ICD-10-CM | POA: Diagnosis not present

## 2022-03-29 DIAGNOSIS — M62838 Other muscle spasm: Secondary | ICD-10-CM | POA: Diagnosis not present

## 2022-03-31 DIAGNOSIS — G4733 Obstructive sleep apnea (adult) (pediatric): Secondary | ICD-10-CM | POA: Diagnosis not present
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
## 2022-04-05 ENCOUNTER — Ambulatory Visit: Payer: Medicaid Other | Admitting: Family Medicine

## 2022-04-05 ENCOUNTER — Encounter: Payer: Self-pay | Admitting: Family Medicine

## 2022-04-05 VITALS — BP 125/79 | HR 80 | Temp 97.8°F | Resp 16 | Ht 68.0 in | Wt 193.5 lb

## 2022-04-05 DIAGNOSIS — S8411XS Injury of peroneal nerve at lower leg level, right leg, sequela: Secondary | ICD-10-CM | POA: Diagnosis not present

## 2022-04-05 DIAGNOSIS — M792 Neuralgia and neuritis, unspecified: Secondary | ICD-10-CM

## 2022-04-05 DIAGNOSIS — Z1211 Encounter for screening for malignant neoplasm of colon: Secondary | ICD-10-CM | POA: Diagnosis not present

## 2022-04-05 DIAGNOSIS — E7800 Pure hypercholesterolemia, unspecified: Secondary | ICD-10-CM | POA: Diagnosis not present

## 2022-04-05 DIAGNOSIS — F331 Major depressive disorder, recurrent, moderate: Secondary | ICD-10-CM | POA: Diagnosis not present

## 2022-04-05 DIAGNOSIS — G894 Chronic pain syndrome: Secondary | ICD-10-CM | POA: Diagnosis not present

## 2022-04-05 DIAGNOSIS — Z0000 Encounter for general adult medical examination without abnormal findings: Secondary | ICD-10-CM

## 2022-04-05 DIAGNOSIS — R7303 Prediabetes: Secondary | ICD-10-CM | POA: Diagnosis not present

## 2022-04-05 MED ORDER — PREGABALIN 200 MG PO CAPS: 200.0000 mg | ORAL_CAPSULE | Freq: Three times a day (TID) | ORAL | 2 refills | Status: DC

## 2022-04-05 NOTE — Assessment & Plan Note (Signed)
Chronic and stable Reports that he is in a much better place now than he was when I saw him a few weeks ago He did not tolerate Zoloft due to nausea and side effects He does not want to try different medication at this time Contracted for safety-no SI/HI Encourage therapy

## 2022-04-05 NOTE — Assessment & Plan Note (Signed)
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

## 2022-04-05 NOTE — Assessment & Plan Note (Signed)
Previously well controlled Continue statin Repeat FLP and CMP Goal LDL < 70 

## 2022-04-05 NOTE — Assessment & Plan Note (Signed)
Recommend low carb diet °Recheck A1c  °

## 2022-04-06 ENCOUNTER — Other Ambulatory Visit: Payer: Self-pay

## 2022-04-06 ENCOUNTER — Telehealth: Payer: Self-pay

## 2022-04-06 DIAGNOSIS — Z86010 Personal history of colonic polyps: Secondary | ICD-10-CM

## 2022-04-06 LAB — LIPID PANEL
Chol/HDL Ratio: 2.8 ratio (ref 0.0–5.0)
Cholesterol, Total: 136 mg/dL (ref 100–199)
HDL: 48 mg/dL (ref >39)
LDL Chol Calc (NIH): 51 mg/dL (ref 0–99)
Triglycerides: 236 mg/dL — ABNORMAL HIGH (ref 0–149)
VLDL Cholesterol Cal: 37 mg/dL (ref 5–40)

## 2022-04-06 LAB — HEMOGLOBIN A1C
Est. average glucose Bld gHb Est-mCnc: 128 mg/dL
Hgb A1c MFr Bld: 6.1 % — ABNORMAL HIGH (ref 4.8–5.6)

## 2022-04-06 MED ORDER — NA SULFATE-K SULFATE-MG SULF 17.5-3.13-1.6 GM/177ML PO SOLN: 1.0000 | Freq: Once | ORAL | 0 refills | Status: AC

## 2022-04-06 NOTE — Telephone Encounter (Signed)
Gastroenterology Pre-Procedure Review  Request Date: 04/27/22 Requesting Physician: Dr. Vicente Males  PATIENT REVIEW QUESTIONS: The patient responded to the following health history questions as indicated:    1. Are you having any GI issues?  30 years ago 6 inches of colon removed for diverticulitis 2. Do you have a personal history of Polyps? yes (last colonoscopy performed by Dr. Vicente Males on 02/24/17) 3. Do you have a family history of Colon Cancer or Polyps? no 4. Diabetes Mellitus? no 5. Joint replacements in the past 12 months?no 6. Major health problems in the past 3 months?yes (02/09/22 ER Admission for Chest Pain Cardiac clearance sent to Dr. Kizzie Fantasia office) 7. Any artificial heart valves, MVP, or defibrillator?no however patient does have cardiac history     MEDICATIONS & ALLERGIES:    Patient reports the following regarding taking any anticoagulation/antiplatelet therapy:   Plavix, Coumadin, Eliquis, Xarelto, Lovenox, Pradaxa, Brilinta, or Effient? no Aspirin? yes (81 mg daily)  Patient confirms/reports the following medications:  Current Outpatient Medications  Medication Sig Dispense Refill   albuterol (VENTOLIN HFA) 108 (90 Base) MCG/ACT inhaler Inhale 2 puffs into the lungs every 6 (six) hours as needed for wheezing or shortness of breath. 8 g 6   amLODipine (NORVASC) 2.5 MG tablet Take 1 tablet (2.5 mg total) by mouth daily. 30 tablet 11   aspirin EC 81 MG tablet Take 81 mg by mouth daily.      buPROPion (WELLBUTRIN XL) 150 MG 24 hr tablet Take 1 tablet (150 mg total) by mouth daily. 30 tablet 5   cetirizine (ZYRTEC) 10 MG tablet TAKE 1 TABLET BY MOUTH EVERY DAY 30 tablet 11   cholecalciferol (VITAMIN D3) 25 MCG (1000 UNIT) tablet Take 2,000 Units by mouth daily.     COSENTYX SENSOREADY, 300 MG, 150 MG/ML SOAJ Inject 300mg  sub q every 4 weeks for maintenance 2 mL 2   fluticasone (FLONASE) 50 MCG/ACT nasal spray SPRAY 2 SPRAYS INTO EACH NOSTRIL EVERY DAY 16 mL 11   folic acid  (FOLVITE) 1 MG tablet Take by mouth.     hydrochlorothiazide (HYDRODIURIL) 25 MG tablet TAKE 1 TABLET (25 MG TOTAL) BY MOUTH DAILY. 90 tablet 1   HYDROcodone-acetaminophen (NORCO) 7.5-325 MG tablet Take 1 tablet by mouth every 6 (six) hours as needed for severe pain. Must last 30 days. 120 tablet 0   HYDROcodone-acetaminophen (NORCO) 7.5-325 MG tablet Take 1 tablet by mouth every 6 (six) hours as needed for severe pain. Must last 30 days. 120 tablet 0   HYDROcodone-acetaminophen (NORCO) 7.5-325 MG tablet Take 1 tablet by mouth every 6 (six) hours as needed for severe pain. Must last 30 days. 120 tablet 0   ibuprofen (ADVIL) 800 MG tablet Take 1 tablet (800 mg total) by mouth every 6 (six) hours as needed. 60 tablet 1   isosorbide mononitrate (IMDUR) 30 MG 24 hr tablet TAKE 1/2 OF A TABLET (15 MG TOTAL) BY MOUTH DAILY 45 tablet 2   losartan (COZAAR) 100 MG tablet TAKE 1 TABLET BY MOUTH EVERY DAY 90 tablet 0   Magnesium Hydroxide (MAGNESIA PO) Take 1 tablet by mouth 3 (three) times daily.     Melatonin 10 MG CAPS Take 20 mg by mouth at bedtime.     methocarbamol (ROBAXIN) 750 MG tablet TAKE 1 TABLET (750 MG TOTAL) BY MOUTH EVERY 8 (EIGHT) HOURS AS NEEDED FOR MUSCLE SPASMS. 90 tablet 0   methotrexate (RHEUMATREX) 2.5 MG tablet Take 15 mg by mouth once a week.  montelukast (SINGULAIR) 10 MG tablet Take 1 tablet (10 mg total) by mouth at bedtime. 90 tablet 2   naloxone (NARCAN) nasal spray 4 mg/0.1 mL Use in case of opioid overdose 1 each 0   nitroGLYCERIN (NITROSTAT) 0.4 MG SL tablet Place 1 tablet (0.4 mg total) under the tongue every 5 (five) minutes as needed for chest pain. 25 tablet 3   pregabalin (LYRICA) 200 MG capsule Take 1 capsule (200 mg total) by mouth in the morning, at noon, and at bedtime. 90 capsule 2   rosuvastatin (CRESTOR) 40 MG tablet TAKE 1 TABLET BY MOUTH EVERY DAY 90 tablet 1   triamcinolone ointment (KENALOG) 0.5 % Apply topically.     TURMERIC PO Take 1 capsule by mouth  daily.     umeclidinium-vilanterol (ANORO ELLIPTA) 62.5-25 MCG/ACT AEPB Inhale 1 puff into the lungs daily. 60 each 6   No current facility-administered medications for this visit.    Patient confirms/reports the following allergies:  Allergies  Allergen Reactions   Lisinopril Cough   Oxycodone Nausea And Vomiting    No orders of the defined types were placed in this encounter.   AUTHORIZATION INFORMATION Primary Insurance: 1D#: Group #:  Secondary Insurance: 1D#: Group #:  SCHEDULE INFORMATION: Date: 04/27/22 Time: Location: [facility]
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
## 2022-04-07 ENCOUNTER — Telehealth: Payer: Self-pay | Admitting: *Deleted

## 2022-04-07 ENCOUNTER — Ambulatory Visit: Payer: Medicaid Other | Attending: Pain Medicine | Admitting: Pain Medicine

## 2022-04-07 ENCOUNTER — Encounter: Payer: Self-pay | Admitting: Pain Medicine

## 2022-04-07 VITALS — BP 116/87 | HR 84 | Temp 98.1°F | Ht 68.0 in | Wt 193.0 lb

## 2022-04-07 DIAGNOSIS — M62838 Other muscle spasm: Secondary | ICD-10-CM | POA: Insufficient documentation

## 2022-04-07 DIAGNOSIS — M79671 Pain in right foot: Secondary | ICD-10-CM | POA: Insufficient documentation

## 2022-04-07 DIAGNOSIS — M25571 Pain in right ankle and joints of right foot: Secondary | ICD-10-CM | POA: Insufficient documentation

## 2022-04-07 DIAGNOSIS — M47812 Spondylosis without myelopathy or radiculopathy, cervical region: Secondary | ICD-10-CM | POA: Diagnosis not present

## 2022-04-07 DIAGNOSIS — M4722 Other spondylosis with radiculopathy, cervical region: Secondary | ICD-10-CM | POA: Insufficient documentation

## 2022-04-07 DIAGNOSIS — G894 Chronic pain syndrome: Secondary | ICD-10-CM | POA: Diagnosis not present

## 2022-04-07 DIAGNOSIS — G8929 Other chronic pain: Secondary | ICD-10-CM

## 2022-04-07 DIAGNOSIS — G5731 Lesion of lateral popliteal nerve, right lower limb: Secondary | ICD-10-CM | POA: Diagnosis not present

## 2022-04-07 DIAGNOSIS — M5010 Cervical disc disorder with radiculopathy, unspecified cervical region: Secondary | ICD-10-CM | POA: Insufficient documentation

## 2022-04-07 DIAGNOSIS — M542 Cervicalgia: Secondary | ICD-10-CM | POA: Insufficient documentation

## 2022-04-07 DIAGNOSIS — M5412 Radiculopathy, cervical region: Secondary | ICD-10-CM

## 2022-04-07 DIAGNOSIS — G5791 Unspecified mononeuropathy of right lower limb: Secondary | ICD-10-CM

## 2022-04-07 DIAGNOSIS — M4802 Spinal stenosis, cervical region: Secondary | ICD-10-CM

## 2022-04-07 DIAGNOSIS — S8411XS Injury of peroneal nerve at lower leg level, right leg, sequela: Secondary | ICD-10-CM | POA: Diagnosis not present

## 2022-04-07 NOTE — Telephone Encounter (Signed)
   Patient Name: EDISSON PAVLOVICH  DOB: 13-May-1955 MRN: [id number]  Primary Cardiologist: Ida Rogue, MD  Chart reviewed as part of pre-operative protocol coverage. Given past medical history and time since last visit, based on ACC/AHA guidelines, Gordon L Woulard is at acceptable risk for the planned procedure without further cardiovascular testing.  Mr. Hegewald was able to complete 4 METS of activity without any difficulty.  He also reported no changes to his health since his previous visit.  The patient was advised that if he develops new symptoms prior to surgery to contact our office to arrange for a follow-up visit, and he verbalized understanding.  Regarding ASA therapy, we recommend continuation of ASA throughout the perioperative period.  However, if the surgeon feels that cessation of ASA is required in the perioperative period, it may be stopped 5-7 days prior to surgery with a plan to resume it as soon as felt to be feasible from a surgical standpoint in the post-operative period.   I will route this recommendation to the requesting party via Epic fax function and remove from pre-op pool.  Please call with questions.  Mable Fill, Marissa Nestle, NP 04/07/2022, 9:07 AM

## 2022-04-07 NOTE — Patient Instructions (Signed)

## 2022-04-07 NOTE — Telephone Encounter (Signed)
   Pre-operative Risk Assessment    Patient Name: Caleb [name]  DOB: 1955-08-20 MRN: [id number]      Request for Surgical Clearance    Procedure:   COLONOSCOPY  Date of Surgery:  Clearance 04/27/22                                 Surgeon:  DR. Bailey Mech ANNA Surgeon's Group or Practice Name:  [facility] Phone number:  [phone] Fax number:  [phone]   Type of Clearance Requested:   - Medical ; NO MEDICATIONS LISTED AS NEEDING TO BE HELD   Type of Anesthesia:  GENERAL   Additional requests/questions:    Jiles Prows   04/07/2022, 8:27 AM   Vanetta Mulders, CMA on 04/06/2022     [facility] [address] [place], [place]  [address] Phone:  [phone]   Fax:  [phone]   04/06/2022   Patient Name: Caleb [name]   DOB: 1955/09/10   To NP-Christopher Sharolyn Douglas:   Mr. Seide has been scheduled for a colonoscopy on 04/27/22, under GENERAL anesthesia.   Please FAX a note of Medical Clearance to [phone], ATTENTION: Sharyn Lull, CMA   To advise if this patient will require an office visit or further medical work-up before clearance can be given please call [phone] (desk number) and leave a message with the triage nurse.   Thank you      [facility]     _____  Patient is cleared to have procedure   _____  Patient is NOT cleared to have procedure    Additional notes:_________________________________________________________   ______________________________________________________________________     _______________________________________     ________________ Physician Signature                                                    Date    [facility], [id number]                     1

## 2022-04-09 DIAGNOSIS — M5412 Radiculopathy, cervical region: Secondary | ICD-10-CM | POA: Diagnosis not present

## 2022-04-09 DIAGNOSIS — M62838 Other muscle spasm: Secondary | ICD-10-CM | POA: Diagnosis not present

## 2022-04-13 DIAGNOSIS — M5412 Radiculopathy, cervical region: Secondary | ICD-10-CM | POA: Diagnosis not present

## 2022-04-13 DIAGNOSIS — M62838 Other muscle spasm: Secondary | ICD-10-CM | POA: Diagnosis not present

## 2022-04-20 ENCOUNTER — Encounter: Payer: Self-pay | Admitting: Gastroenterology

## 2022-04-20 DIAGNOSIS — M5412 Radiculopathy, cervical region: Secondary | ICD-10-CM | POA: Diagnosis not present

## 2022-04-20 DIAGNOSIS — M62838 Other muscle spasm: Secondary | ICD-10-CM | POA: Diagnosis not present

## 2022-04-22 DIAGNOSIS — Z79899 Other long term (current) drug therapy: Secondary | ICD-10-CM | POA: Diagnosis not present

## 2022-04-22 DIAGNOSIS — M47812 Spondylosis without myelopathy or radiculopathy, cervical region: Secondary | ICD-10-CM | POA: Diagnosis not present

## 2022-04-22 DIAGNOSIS — M79671 Pain in right foot: Secondary | ICD-10-CM | POA: Diagnosis not present

## 2022-04-22 DIAGNOSIS — Z6830 Body mass index (BMI) 30.0-30.9, adult: Secondary | ICD-10-CM | POA: Diagnosis not present

## 2022-04-22 DIAGNOSIS — G894 Chronic pain syndrome: Secondary | ICD-10-CM | POA: Diagnosis not present

## 2022-04-23 DIAGNOSIS — M62838 Other muscle spasm: Secondary | ICD-10-CM | POA: Diagnosis not present

## 2022-04-23 DIAGNOSIS — M5412 Radiculopathy, cervical region: Secondary | ICD-10-CM | POA: Diagnosis not present

## 2022-04-26 ENCOUNTER — Emergency Department: Payer: Medicaid Other

## 2022-04-26 ENCOUNTER — Encounter: Payer: Self-pay | Admitting: Radiology

## 2022-04-26 ENCOUNTER — Emergency Department
Admission: EM | Admit: 2022-04-26 | Discharge: 2022-04-26 | Disposition: A | Discharge status: Home / Self Care | Payer: Medicaid Other | Attending: Emergency Medicine | Admitting: Emergency Medicine

## 2022-04-26 ENCOUNTER — Encounter: Payer: Self-pay | Admitting: Gastroenterology

## 2022-04-26 ENCOUNTER — Ambulatory Visit: Payer: Self-pay

## 2022-04-26 ENCOUNTER — Other Ambulatory Visit: Payer: Self-pay

## 2022-04-26 DIAGNOSIS — J439 Emphysema, unspecified: Secondary | ICD-10-CM | POA: Diagnosis not present

## 2022-04-26 DIAGNOSIS — R079 Chest pain, unspecified: Secondary | ICD-10-CM | POA: Diagnosis not present

## 2022-04-26 DIAGNOSIS — R0789 Other chest pain: Secondary | ICD-10-CM | POA: Diagnosis not present

## 2022-04-26 DIAGNOSIS — J449 Chronic obstructive pulmonary disease, unspecified: Secondary | ICD-10-CM | POA: Diagnosis not present

## 2022-04-26 LAB — CBC
HCT: 44.4 % (ref 39.0–52.0)
Hemoglobin: 14.8 g/dL (ref 13.0–17.0)
MCH: 32.4 pg (ref 26.0–34.0)
MCHC: 33.3 g/dL (ref 30.0–36.0)
MCV: 97.2 fL (ref 80.0–100.0)
Platelets: 203 10*3/uL (ref 150–400)
RBC: 4.57 MIL/uL (ref 4.22–5.81)
RDW: 14.1 % (ref 11.5–15.5)
WBC: 9.9 10*3/uL (ref 4.0–10.5)
nRBC: 0 % (ref 0.0–0.2)

## 2022-04-26 LAB — BASIC METABOLIC PANEL
Anion gap: 10 (ref 5–15)
BUN: 17 mg/dL (ref 8–23)
CO2: 29 mmol/L (ref 22–32)
Calcium: 9.1 mg/dL (ref 8.9–10.3)
Chloride: 98 mmol/L (ref 98–111)
Creatinine, Ser: 0.75 mg/dL (ref 0.61–1.24)
GFR, Estimated: >60 mL/min (ref >60)
Glucose, Bld: 104 mg/dL — ABNORMAL HIGH (ref 70–99)
Potassium: 3.5 mmol/L (ref 3.5–5.1)
Sodium: 137 mmol/L (ref 135–145)

## 2022-04-26 LAB — TROPONIN I (HIGH SENSITIVITY): Troponin I (High Sensitivity): 8 ng/L (ref <18)

## 2022-04-26 MED ORDER — IOHEXOL 350 MG/ML SOLN
75.0000 mL | Freq: Once | INTRAVENOUS | Status: AC | PRN
  Administered 2022-04-26: 75 mL via INTRAVENOUS

## 2022-04-26 NOTE — ED Provider Notes (Signed)
[facility] Provider Note    Event Date/Time   First MD Initiated Contact with Patient 04/26/22 1242     (approximate)   History   Chest Pain   HPI  Caleb [name] is a 67 y.o. male who presents with complaints of chest pain this morning that radiated through to his back.  Now much improved.  He reports this happened around 6 or 7 AM.  He has never had this before.  He felt short of breath when it happened.  No history of blood clots.  No lower extremity swelling.  No history of heart disease.  No fevers chills or cough     Physical Exam   Triage Vital Signs: ED Triage Vitals  Enc Vitals Group     BP 04/26/22 1204 115/65     Pulse Rate 04/26/22 1204 72     Resp 04/26/22 1204 20     Temp 04/26/22 1207 (!) 97.5 F (36.4 C)     Temp Source 04/26/22 1207 Oral     SpO2 04/26/22 1204 100 %     Weight 04/26/22 1205 87.5 kg (192 lb 14.4 oz)     Height 04/26/22 1205 1.727 m ( )     Head Circumference --      Peak Flow --      Pain Score 04/26/22 1205 5     Pain Loc --      Pain Edu? --      Excl. in GC? --     Most recent vital signs: Vitals:   04/26/22 1204 04/26/22 1207  BP: 115/65   Pulse: 72   Resp: 20   Temp:  (!) 97.5 F (36.4 C)  SpO2: 100%      General: Awake, no distress.  CV:  Good peripheral perfusion.  No murmur Resp:  Normal effort.  Clear to auscultation bilaterally Abd:  No distention.  Other:  No lower extremity swelling   ED Results / Procedures / Treatments   Labs (all labs ordered are listed, but only abnormal results are displayed) Labs Reviewed  BASIC METABOLIC PANEL - Abnormal; Notable for the following components:      Result Value   Glucose, Bld 104 (*)    All other components within normal limits  CBC  TROPONIN I (HIGH SENSITIVITY)     EKG  ED ECG REPORT I, Jene Every, the attending physician, personally viewed and interpreted this ECG.  Date: 04/26/2022 EKG Time:  Rate:  Rhythm: normal  sinus rhythm QRS Axis: normal Intervals: normal ST/T Wave abnormalities: normal Narrative Interpretation: no evidence of acute ischemia    RADIOLOGY Chest x-ray viewed interpret by me, no acute normality  CT angiography  PROCEDURES:  Critical Care performed:   Procedures   MEDICATIONS ORDERED IN ED: Medications  iohexol (OMNIPAQUE) 350 MG/ML injection 75 mL (75 mLs Intravenous Contrast Given 04/26/22 1428)     IMPRESSION / MDM / ASSESSMENT AND PLAN / ED COURSE  I reviewed the triage vital signs and the nursing notes. Patient's presentation is most consistent with acute presentation with potential threat to life or bodily function.  Patient presents with chest pain as detailed above, differential includes ACS, less likely PE dissection, musculoskeletal pain, GERD  EKG is overall reassuring, high sensitive troponin is normal, not consistent with ACS.  No evidence of pneumonia or pneumothorax on chest x-ray, will send for CT angiography to evaluate for PE less likely dissection  CT angiography is reassuring, patient is  feeling improved, anxious to leave, referral to cardiology, return precautions discussed        FINAL CLINICAL IMPRESSION(S) / ED DIAGNOSES   Final diagnoses:  Atypical chest pain     Rx / DC Orders   ED Discharge Orders          Ordered    Ambulatory referral to Cardiology       Comments: If you have not heard from the Cardiology office within the next 72 hours please call [phone].   04/26/22 1458             Note:  This document was prepared using Dragon voice recognition software and may include unintentional dictation errors.   Jene Every, MD 04/26/22 [id number]

## 2022-04-26 NOTE — ED Notes (Signed)
AVS provided to and discussed with patient. Pt verbalizes understanding of discharge instructions and denies any questions or concerns at this time. Pt ambulated out of department independently with steady gait.  

## 2022-04-26 NOTE — ED Triage Notes (Signed)
Pt here with cp that started a few days ago but got worse on yesterday. Pt states the pain radiates across the top of his abd radiating to his back. Pt states he hurts all over and has chills. Pt states he was also having trouble breathing while he was eating breakfast this morning. Pt states the pain is constant.

## 2022-04-26 NOTE — Telephone Encounter (Signed)
Agree with ED evaluation for worsening chest pain   Ronnald Ramp, MD  [facility]

## 2022-04-26 NOTE — Telephone Encounter (Signed)
  Chief Complaint: Chest pain - upper abdominal pain Symptoms: above Frequency: Since last week - comes and goes - much worse this morning Pertinent Negatives: Patient denies  Disposition: ED /[] Urgent Care (no appt availability in office) / Appointment(In office/virtual)/  [facility]/ Home Care/ Refused Recommended Disposition /[] [facility]/  Follow-up with PCP Additional Notes: PT has hx of both lung and heart issues. Pt states that chest pain - upper abdominal pain started last week.  Today pain started at 8am and has not resolved. PT unable to eat.      Reason for Disposition  [1] Chest pain (or "angina") comes and goes AND [2] is happening more often (increasing in frequency) or getting worse (increasing in severity)  (Exception: Chest pains that last only a few seconds.)  Answer Assessment - Initial Assessment Questions 1. LOCATION: "Where does it hurt?"       Center of Chest 2. RADIATION: "Does the pain go anywhere else?" (e.g., into neck, jaw, arms, back)     Back to the right side 3. ONSET: "When did the chest pain begin?" (Minutes, hours or days)      Last week - much worse this morning 4. PATTERN: "Does the pain come and go, or has it been constant since it started?"  "Does it get worse with exertion?"      Comes and goes 5. DURATION: "How long does it last" (e.g., seconds, minutes, hours)     hours 6. SEVERITY: "How bad is the pain?"  (e.g., Scale 1-10; mild, moderate, or severe)    - MILD (1-3): doesn't interfere with normal activities     - MODERATE (4-7): interferes with normal activities or awakens from sleep    - SEVERE (8-10): excruciating pain, unable to do any normal activities       8/10 7. CARDIAC RISK FACTORS: "Do you have any history of heart problems or risk factors for heart disease?" (e.g., angina, prior heart attack; diabetes, high blood pressure, high cholesterol, smoker, or strong family history of heart disease)      Yes - irregular heartbeat - cath - stents 8. PULMONARY RISK FACTORS: "Do you have any history of lung disease?"  (e.g., blood clots in lung, asthma, emphysema, birth control pills)     COPD and emphysema 9. CAUSE: "What do you think is causing the chest pain?"     Unsure 10. OTHER SYMPTOMS: "Do you have any other symptoms?" (e.g., dizziness, nausea, vomiting, sweating, fever, difficulty breathing, cough)       no  Protocols used: Chest Pain-A-AH

## 2022-04-27 ENCOUNTER — Other Ambulatory Visit: Payer: Self-pay

## 2022-04-27 ENCOUNTER — Encounter: Admission: RE | Payer: Self-pay | Source: Home / Self Care

## 2022-04-27 ENCOUNTER — Telehealth: Payer: Self-pay

## 2022-04-27 ENCOUNTER — Ambulatory Visit: Admission: RE | Admit: 2022-04-27 | Payer: Medicaid Other | Source: Home / Self Care | Admitting: Gastroenterology

## 2022-04-27 DIAGNOSIS — Z860100 Personal history of colon polyps, unspecified: Secondary | ICD-10-CM

## 2022-04-27 SURGERY — COLONOSCOPY WITH PROPOFOL
Anesthesia: General

## 2022-04-27 NOTE — Telephone Encounter (Signed)
Left voice message for patient to call office when he is feeling better to reschedule his colonoscopy that was scheduled for today with Dr. Tobi Bastos.  He was seen in ER yesterday for chest pain.  Thanks,  [place], [place]

## 2022-04-27 NOTE — Telephone Encounter (Signed)
Ok proceed with colonoscopy

## 2022-04-27 NOTE — Telephone Encounter (Signed)
Pt notified Trish in Endo that he would like to go ahead and reschedule his colonoscopy that was canceled for today due to yesterdays ER Visit.    Patient was contacted to reschedule asked  if he was advised to follow up with his Cardiologist he said they told him to follow up with Pulmonology.  ER note said cardiology referral was placed.  I've sent a secure message to Dr. Mariah Milling who is his primary cardiologist to see if I need to send a new clearance over priot to rescheduling. Sent it to Dr. Mariah Milling just in case. Patient has been advised that I will check with Cardiology to see if we need to have another clearance prior to rescheduling.  Thanks,  [place], [place]

## 2022-04-27 NOTE — Telephone Encounter (Signed)
Dr. Mariah Milling has cleared patient  for 05/03/22 Colonoscopy. Per Dr. Mariah Milling secure message- "He had recent cardiac catheterization 2 months ago, medical management recommended.  Prior history of periodic atypical chest pain.  Workup in the ER negative.  I think we can clear him for colonoscopy unless he wants to be seen for further evaluation.  Given recent catheterization we do not need stress testing or repeat cath".  Thanks, [place], [place]

## 2022-04-28 NOTE — Progress Notes (Unsigned)
PROVIDER NOTE: Interpretation of information contained herein should be left to medically-trained personnel. Specific patient instructions are provided elsewhere under "Patient Instructions" section of medical record. This document was created in part using STT-dictation technology, any transcriptional errors that may result from this process are unintentional.  Patient: Caleb [name] Type: Established DOB: 12/27/1955 MRN: [id number] PCP: Erasmo Downer, MD  Service: Procedure DOS: 04/29/2022 Setting: Ambulatory Location: Ambulatory outpatient facility Delivery: Face-to-face Provider: Oswaldo Done, MD Specialty: Interventional Pain Management Specialty designation: 09 Location: Outpatient facility Ref. Prov.: Bacigalupo, Marzella Schlein, MD       Interventional Therapy   Primary Reason for Visit: Interventional Pain Management Treatment. CC: No chief complaint on file.  Procedure:           Type: Therapeutic Common Peroneal/Fibular (C-PN)           Nerve Block  ***   Laterality: Right  Level: Distal lower extremity.  Imaging: Fluoroscopic guidance Anesthesia: Local anesthesia (1-2% Lidocaine) Anxiolysis:    ***                    Sedation:                         DOS: 04/29/2022  Performed by: Oswaldo Done, MD  Purpose: Diagnostic/Therapeutic Indications: *** severe enough to impact quality of life or function. No diagnosis found. NAS-11 Pain score:   Pre-procedure:  /10   Post-procedure:  /10      Position / Prep / Materials:  Position: Supine Prep solution: DuraPrep (Iodine Povacrylex [0.7% available iodine] and Isopropyl Alcohol, 74% w/w) Prep Area: Entire  ***        lower extremity region  Materials:  Tray:  ***  Needle(s):  Type:  ***   Gauge (G):  ***   Length:  ***   Qty:  ***   Pre-op H&P Assessment:  Mr. Saha is a 67 y.o. (year old), male patient, seen today for interventional treatment. He  has a past surgical history that includes  Lumbar laminectomy (1989, 1999); Cervical fusion (1988, 1998); Liver surgery (2015); Partial colectomy (1990); Inguinal hernia repair (Bilateral, 1975); Lithotripsy; Colonoscopy with propofol (N/A, 02/24/2017); Cardiac catheterization (2015); Arthrodesis metatarsal (Right, 11/26/2019); Lesion removal (Right, 11/26/2019); LEFT HEART CATH AND CORONARY ANGIOGRAPHY (Left, 02/09/2022); CORONARY PRESSURE/FFR STUDY (Right, 02/09/2022); Back surgery; and Colon surgery. Mr. Saadeh has a current medication list which includes the following prescription(s): albuterol, amlodipine, aspirin ec, bupropion, cetirizine, cholecalciferol, cosentyx sensoready (300 mg), fluticasone, folic acid, hydrochlorothiazide, hydrocodone-acetaminophen, hydrocodone-acetaminophen, hydrocodone-acetaminophen, ibuprofen, isosorbide mononitrate, losartan, magnesium hydroxide, melatonin, methocarbamol, methotrexate, montelukast, naloxone, nitroglycerin, pregabalin, rosuvastatin, triamcinolone ointment, turmeric, and anoro ellipta. His primarily concern today is the No chief complaint on file.  Initial Vital Signs:  Pulse/HCG Rate:    Temp:   Resp:   BP:   SpO2:    BMI: Estimated body mass index is 29.33 kg/m as calculated from the following:   Height as of 04/26/22: 5\' 8"  (1.727 m).   Weight as of 04/26/22: 192 lb 14.4 oz (87.5 kg).  Risk Assessment: Allergies: Reviewed. He is allergic to lisinopril and oxycodone.  Allergy Precautions: None required Coagulopathies: Reviewed. None identified.  Blood-thinner therapy: None at this time Active Infection(s): Reviewed. None identified. Mr. Machnik is afebrile  Site Confirmation: Mr. Wessels was asked to confirm the procedure and laterality before marking the site Procedure checklist: Completed Consent: Before the procedure and under the influence of no sedative(s), amnesic(s), or  anxiolytics, the patient was informed of the treatment options, risks and possible complications. To fulfill our  ethical and legal obligations, as recommended by the American Medical Association's Code of Ethics, I have informed the patient of my clinical impression; the nature and purpose of the treatment or procedure; the risks, benefits, and possible complications of the intervention; the alternatives, including doing nothing; the risk(s) and benefit(s) of the alternative treatment(s) or procedure(s); and the risk(s) and benefit(s) of doing nothing. The patient was provided information about the general risks and possible complications associated with the procedure. These may include, but are not limited to: failure to achieve desired goals, infection, bleeding, organ or nerve damage, allergic reactions, paralysis, and death. In addition, the patient was informed of those risks and complications associated to the procedure, such as failure to decrease pain; infection; bleeding; organ or nerve damage with subsequent damage to sensory, motor, and/or autonomic systems, resulting in permanent pain, numbness, and/or weakness of one or several areas of the body; allergic reactions; (i.e.: anaphylactic reaction); and/or death. Furthermore, the patient was informed of those risks and complications associated with the medications. These include, but are not limited to: allergic reactions (i.e.: anaphylactic or anaphylactoid reaction(s)); adrenal axis suppression; blood sugar elevation that in diabetics may result in ketoacidosis or comma; water retention that in patients with history of congestive heart failure may result in shortness of breath, pulmonary edema, and decompensation with resultant heart failure; weight gain; swelling or edema; medication-induced neural toxicity; particulate matter embolism and blood vessel occlusion with resultant organ, and/or nervous system infarction; and/or aseptic necrosis of one or more joints. Finally, the patient was informed that Medicine is not an exact science; therefore, there is also  the possibility of unforeseen or unpredictable risks and/or possible complications that may result in a catastrophic outcome. The patient indicated having understood very clearly. We have given the patient no guarantees and we have made no promises. Enough time was given to the patient to ask questions, all of which were answered to the patient's satisfaction. Mr. Vanhouten has indicated that he wanted to continue with the procedure. Attestation: I, the ordering provider, attest that I have discussed with the patient the benefits, risks, side-effects, alternatives, likelihood of achieving goals, and potential problems during recovery for the procedure that I have provided informed consent. Date  Time: {[facility]-PAIN TIME CHOICES:21018001}  Pre-Procedure Preparation:  Monitoring: As per clinic protocol. Respiration, ETCO2, SpO2, BP, heart rate and rhythm monitor placed and checked for adequate function Safety Precautions: Patient was assessed for positional comfort and pressure points before starting the procedure. Time-out: I initiated and conducted the "Time-out" before starting the procedure, as per protocol. The patient was asked to participate by confirming the accuracy of the "Time Out" information. Verification of the correct person, site, and procedure were performed and confirmed by me, the nursing staff, and the patient. "Time-out" conducted as per Joint Commission's Universal Protocol (UP.01.01.01). Time:   Start Time:   hrs.  Description/Narrative of Procedure:          Target:  ***  Location: *** Site: *** Region:                         Approach: Percutaneous   Rationale (medical necessity): procedure needed and proper for the diagnosis and/or treatment of the patient's medical symptoms and needs. Procedural Technique Safety Precautions: Aspiration looking for blood return was conducted prior to all injections. At no point did we inject  any substances, as a needle was being advanced.  No attempts were made at seeking any paresthesias. Safe injection practices and needle disposal techniques used. Medications properly checked for expiration dates. SDV (single dose vial) medications used. Description of the Procedure: Protocol guidelines were followed. The patient was assisted into a comfortable position. The target area was identified and the area prepped in the usual manner. Skin & deeper tissues infiltrated with local anesthetic. Appropriate amount of time allowed to pass for local anesthetics to take effect. The procedure needles were then advanced to the target area. Proper needle placement secured. Negative aspiration confirmed. Solution injected in intermittent fashion, asking for systemic symptoms every 0.5cc of injectate. The needles were then removed and the area cleansed, making sure to leave some of the prepping solution back to take advantage of its long term bactericidal properties.           There were no vitals filed for this visit.   Start Time:   hrs. End Time:   hrs.  Materials:  Needle(s) Type: Spinal Needle Gauge: 22G Length: 3.5-in Medication(s): Please see orders for medications and dosing details.  Imaging Guidance (Non-Spinal):          Type of Imaging Technique: Fluoroscopy Guidance (Non-Spinal) Indication(s): Assistance in needle guidance and placement for procedures requiring needle placement in or near specific anatomical locations not easily accessible without such assistance. Exposure Time: Please see nurses notes. Contrast: None used. Fluoroscopic Guidance: I was personally present during the use of fluoroscopy. "Tunnel Vision Technique" used to obtain the best possible view of the target area. Parallax error corrected before commencing the procedure. "Direction-depth-direction" technique used to introduce the needle under continuous pulsed fluoroscopy. Once target was reached, antero-posterior, oblique, and lateral fluoroscopic projection  used confirm needle placement in all planes. Images permanently stored in EMR. Interpretation: No contrast injected. I personally interpreted the imaging intraoperatively. Adequate needle placement confirmed in multiple planes. Permanent images saved into the patient's record.  Antibiotic Prophylaxis:   Anti-infectives (From admission, onward)    None      Indication(s): None identified  Post-operative Assessment:  Post-procedure Vital Signs:  Pulse/HCG Rate:    Temp:   Resp:   BP:   SpO2:    EBL: None  Complications: No immediate post-treatment complications observed by team, or reported by patient.  Note: The patient tolerated the entire procedure well. A repeat set of vitals were taken after the procedure and the patient was kept under observation following institutional policy, for this type of procedure. Post-procedural neurological assessment was performed, showing return to baseline, prior to discharge. The patient was provided with post-procedure discharge instructions, including a section on how to identify potential problems. Should any problems arise concerning this procedure, the patient was given instructions to immediately contact us, at any time, without hesitation. In any case, we plan to contact the patient by telephone for a follow-up status report regarding this interventional procedure.  Comments:  No additional relevant information.  Plan of Care (POC)  Orders:  No orders of the defined types were placed in this encounter.  Chronic Opioid Analgesic:  No chronic opioid analgesics therapy prescribed by our practice. (07/27/2021) Abnormal UDS (+) Benzoylecgonine, a COCAINE metabolite.  Hydrocodone/APAP 5/325 1 tablet every 6 hours (20 mg/day of hydrocodone). MME/day: 20 mg/day.   Medications ordered for procedure: No orders of the defined types were placed in this encounter.  Medications administered: Cortavious L. Seib had no medications administered during  this visit.  See  the medical record for exact dosing, route, and time of administration.  Follow-up plan:   No follow-ups on file.       Interventional Therapies  Risk Factors  Considerations:   (07/27/2021) UDS (+) COCAINE  OSA  Tobacco abuse  CAD  COPD  Emphysema  HTN  Hx. MI  CHF     Planned  Pending:   Therapeutic right common peroneal/superficial peroneal NB  Diagnostic/therapeutic bilateral lower cervical facet MBB w/ mapping (2 weeks after right peroneal nerve block)    Under consideration:   Possible spinal cord stimulator trial  Possible bilateral cervical facet RFA    Completed:   Therapeutic/palliative left CESI x8 (12/18/2020) (6-0) (100/100/80/75-80)  Therapeutic midline CESI x1 (02/10/2021) (5-6) (100/100/100 x 1 week)  Diagnostic bilateral cervical facet MBB x2 (09/22/2017) (5-1) (100/100/100/>50)  Diagnostic bilateral lumbar facet MBB x1 (01/29/2020) (3-0) (100/100/90/90)  Diagnostic left SI joint block x1 (06/19/2019) (8-0) (100/100/100 x3 days/0)  Therapeutic midline serratus posterior TPI/MNB x1 (01/11/2019)  Diagnostic right Common Peroneal NB (C-PNB) x2 (04/14/2017) (5-0) (100/100/20/<25)  Therapeutic right common peroneal nerve (C-PN) RFA x1 (06/21/2017) (3-0) (100/100/100 x 3 days/75-100)  Diagnostic right superficial peroneal (S-PN) NB x5 (08/19/2020) (7-3) (100/100/100/90-100) Therapeutic right superficial peroneal nerve (S-PN) RFA x1 (10/25/2017) (4-0) (100/100/100 x1 day/25) Therapeutic right deep peroneal NB (D-PNB) x1 (05/03/2019)    Therapeutic  Palliative (PRN) options:   Palliative left CESI  Diagnostic bilateral cervical facet block #3  Palliative right superficial peroneal NB #5  Palliative right superficial peroneal nerve RFA #2    Pharmacotherapy  Nonopioids transfer 11/21/2019: Lyrica and Robaxin (07/27/2021) Abnormal UDS (+) Benzoylecgonine, a COCAINE metabolite        Recent Visits Date Type Provider Dept  04/07/22 Office Visit  Delano Metz, MD [facility]  Showing recent visits within past 90 days and meeting all other requirements Future Appointments Date Type Provider Dept  04/29/22 Appointment Delano Metz, MD [facility]  Showing future appointments within next 90 days and meeting all other requirements  Disposition: Discharge home  Discharge (Date  Time): 04/29/2022;   hrs.   Primary Care Physician: Erasmo Downer, MD Location: [facility] Note by: Oswaldo Done, MD (TTS technology used. I apologize for any typographical errors that were not detected and corrected.) Date: 04/29/2022; Time: 8:12 AM  Disclaimer:  Medicine is not an Visual merchandiser. The only guarantee in medicine is that nothing is guaranteed. It is important to note that the decision to proceed with this intervention was based on the information collected from the patient. The Data and conclusions were drawn from the patient's questionnaire, the interview, and the physical examination. Because the information was provided in large part by the patient, it cannot be guaranteed that it has not been purposely or unconsciously manipulated. Every effort has been made to obtain as much relevant data as possible for this evaluation. It is important to note that the conclusions that lead to this procedure are derived in large part from the available data. Always take into account that the treatment will also be dependent on availability of resources and existing treatment guidelines, considered by other Pain Management Practitioners as being common knowledge and practice, at the time of the intervention. For Medico-Legal purposes, it is also important to point out that variation in procedural techniques and pharmacological choices are the acceptable norm. The indications, contraindications, technique, and results of the above procedure should only be interpreted and judged by a Board-Certified  Interventional  Pain Specialist with extensive familiarity and expertise in the same exact procedure and technique.

## 2022-04-29 ENCOUNTER — Ambulatory Visit
Admission: RE | Admit: 2022-04-29 | Discharge: 2022-04-29 | Disposition: A | Discharge status: Home / Self Care | Payer: Medicaid Other | Source: Ambulatory Visit | Attending: Pain Medicine | Admitting: Pain Medicine

## 2022-04-29 ENCOUNTER — Encounter: Payer: Self-pay | Admitting: Pain Medicine

## 2022-04-29 ENCOUNTER — Ambulatory Visit: Payer: Medicaid Other | Attending: Pain Medicine | Admitting: Pain Medicine

## 2022-04-29 VITALS — BP 110/45 | HR 99 | Temp 97.5°F | Resp 17 | Ht 68.0 in | Wt 194.0 lb

## 2022-04-29 DIAGNOSIS — M79671 Pain in right foot: Secondary | ICD-10-CM | POA: Insufficient documentation

## 2022-04-29 DIAGNOSIS — G5791 Unspecified mononeuropathy of right lower limb: Secondary | ICD-10-CM

## 2022-04-29 DIAGNOSIS — G8929 Other chronic pain: Secondary | ICD-10-CM

## 2022-04-29 DIAGNOSIS — M792 Neuralgia and neuritis, unspecified: Secondary | ICD-10-CM | POA: Diagnosis present

## 2022-04-29 DIAGNOSIS — S8411XS Injury of peroneal nerve at lower leg level, right leg, sequela: Secondary | ICD-10-CM

## 2022-04-29 DIAGNOSIS — M25571 Pain in right ankle and joints of right foot: Secondary | ICD-10-CM | POA: Diagnosis present

## 2022-04-29 DIAGNOSIS — G5731 Lesion of lateral popliteal nerve, right lower limb: Secondary | ICD-10-CM | POA: Diagnosis present

## 2022-04-29 MED ORDER — ROPIVACAINE HCL 2 MG/ML IJ SOLN
3.0000 mL | Freq: Once | INTRAMUSCULAR | Status: AC
  Administered 2022-04-29: 3 mL

## 2022-04-29 MED ORDER — DEXAMETHASONE SODIUM PHOSPHATE 10 MG/ML IJ SOLN
INTRAMUSCULAR | Status: AC
  Filled 2022-04-29: qty 1

## 2022-04-29 MED ORDER — TRIAMCINOLONE ACETONIDE 40 MG/ML IJ SUSP
40.0000 mg | Freq: Once | INTRAMUSCULAR | Status: AC
  Administered 2022-04-29: 40 mg

## 2022-04-29 MED ORDER — FENTANYL CITRATE (PF) 100 MCG/2ML IJ SOLN
25.0000 ug | INTRAMUSCULAR | Status: DC | PRN
  Administered 2022-04-29: 50 ug via INTRAVENOUS

## 2022-04-29 MED ORDER — DEXAMETHASONE SODIUM PHOSPHATE 10 MG/ML IJ SOLN
10.0000 mg | Freq: Once | INTRAMUSCULAR | Status: AC
  Administered 2022-04-29: 10 mg

## 2022-04-29 MED ORDER — TRIAMCINOLONE ACETONIDE 40 MG/ML IJ SUSP
INTRAMUSCULAR | Status: AC
  Filled 2022-04-29: qty 1

## 2022-04-29 MED ORDER — LIDOCAINE HCL 2 % IJ SOLN
20.0000 mL | Freq: Once | INTRAMUSCULAR | Status: AC
  Administered 2022-04-29: 100 mg

## 2022-04-29 MED ORDER — LIDOCAINE HCL (PF) 2 % IJ SOLN
INTRAMUSCULAR | Status: AC
  Filled 2022-04-29: qty 10

## 2022-04-29 MED ORDER — FENTANYL CITRATE (PF) 100 MCG/2ML IJ SOLN
INTRAMUSCULAR | Status: AC
  Filled 2022-04-29: qty 2

## 2022-04-29 MED ORDER — PENTAFLUOROPROP-TETRAFLUOROETH EX AERO
INHALATION_SPRAY | Freq: Once | CUTANEOUS | Status: AC
  Administered 2022-04-29: 30 via TOPICAL
  Filled 2022-04-29: qty 116

## 2022-04-29 MED ORDER — MIDAZOLAM HCL 5 MG/5ML IJ SOLN
0.5000 mg | Freq: Once | INTRAMUSCULAR | Status: AC
  Administered 2022-04-29: 2 mg via INTRAVENOUS

## 2022-04-29 MED ORDER — MIDAZOLAM HCL 5 MG/5ML IJ SOLN
INTRAMUSCULAR | Status: AC
  Filled 2022-04-29: qty 5

## 2022-04-29 MED ORDER — LACTATED RINGERS IV SOLN: Freq: Once | INTRAVENOUS | Status: AC

## 2022-04-29 MED ORDER — ROPIVACAINE HCL 2 MG/ML IJ SOLN
INTRAMUSCULAR | Status: AC
  Filled 2022-04-29: qty 20

## 2022-04-29 NOTE — Patient Instructions (Signed)

## 2022-04-30 ENCOUNTER — Telehealth: Payer: Self-pay

## 2022-04-30 DIAGNOSIS — M5412 Radiculopathy, cervical region: Secondary | ICD-10-CM | POA: Diagnosis not present

## 2022-04-30 DIAGNOSIS — M62838 Other muscle spasm: Secondary | ICD-10-CM | POA: Diagnosis not present

## 2022-04-30 NOTE — Telephone Encounter (Signed)
Post procedure follow up.  Patient states he is doing well 

## 2022-05-03 ENCOUNTER — Ambulatory Visit
Admission: RE | Admit: 2022-05-03 | Discharge: 2022-05-03 | Disposition: A | Discharge status: Home / Self Care | Payer: Medicaid Other | Attending: Gastroenterology | Admitting: Gastroenterology

## 2022-05-03 ENCOUNTER — Ambulatory Visit: Payer: Medicaid Other | Admitting: Anesthesiology

## 2022-05-03 ENCOUNTER — Encounter
Admission: RE | Disposition: A | Discharge status: Home / Self Care | Payer: Self-pay | Source: Home / Self Care | Attending: Gastroenterology

## 2022-05-03 ENCOUNTER — Other Ambulatory Visit: Payer: Self-pay

## 2022-05-03 ENCOUNTER — Encounter: Payer: Self-pay | Admitting: Gastroenterology

## 2022-05-03 DIAGNOSIS — I739 Peripheral vascular disease, unspecified: Secondary | ICD-10-CM | POA: Diagnosis not present

## 2022-05-03 DIAGNOSIS — I2510 Atherosclerotic heart disease of native coronary artery without angina pectoris: Secondary | ICD-10-CM | POA: Insufficient documentation

## 2022-05-03 DIAGNOSIS — Z87442 Personal history of urinary calculi: Secondary | ICD-10-CM | POA: Diagnosis not present

## 2022-05-03 DIAGNOSIS — Z8249 Family history of ischemic heart disease and other diseases of the circulatory system: Secondary | ICD-10-CM | POA: Diagnosis not present

## 2022-05-03 DIAGNOSIS — G8929 Other chronic pain: Secondary | ICD-10-CM | POA: Diagnosis not present

## 2022-05-03 DIAGNOSIS — D126 Benign neoplasm of colon, unspecified: Secondary | ICD-10-CM

## 2022-05-03 DIAGNOSIS — F17210 Nicotine dependence, cigarettes, uncomplicated: Secondary | ICD-10-CM | POA: Insufficient documentation

## 2022-05-03 DIAGNOSIS — G629 Polyneuropathy, unspecified: Secondary | ICD-10-CM | POA: Diagnosis not present

## 2022-05-03 DIAGNOSIS — Z980 Intestinal bypass and anastomosis status: Secondary | ICD-10-CM | POA: Insufficient documentation

## 2022-05-03 DIAGNOSIS — G709 Myoneural disorder, unspecified: Secondary | ICD-10-CM | POA: Diagnosis not present

## 2022-05-03 DIAGNOSIS — I255 Ischemic cardiomyopathy: Secondary | ICD-10-CM | POA: Diagnosis not present

## 2022-05-03 DIAGNOSIS — J439 Emphysema, unspecified: Secondary | ICD-10-CM | POA: Insufficient documentation

## 2022-05-03 DIAGNOSIS — K635 Polyp of colon: Secondary | ICD-10-CM | POA: Diagnosis not present

## 2022-05-03 DIAGNOSIS — Z1211 Encounter for screening for malignant neoplasm of colon: Secondary | ICD-10-CM | POA: Diagnosis not present

## 2022-05-03 DIAGNOSIS — I252 Old myocardial infarction: Secondary | ICD-10-CM | POA: Insufficient documentation

## 2022-05-03 DIAGNOSIS — I10 Essential (primary) hypertension: Secondary | ICD-10-CM | POA: Diagnosis not present

## 2022-05-03 DIAGNOSIS — Z955 Presence of coronary angioplasty implant and graft: Secondary | ICD-10-CM | POA: Insufficient documentation

## 2022-05-03 DIAGNOSIS — I509 Heart failure, unspecified: Secondary | ICD-10-CM | POA: Diagnosis not present

## 2022-05-03 DIAGNOSIS — I6529 Occlusion and stenosis of unspecified carotid artery: Secondary | ICD-10-CM | POA: Insufficient documentation

## 2022-05-03 DIAGNOSIS — Z86010 Personal history of colonic polyps: Secondary | ICD-10-CM | POA: Diagnosis not present

## 2022-05-03 DIAGNOSIS — I110 Hypertensive heart disease with heart failure: Secondary | ICD-10-CM | POA: Insufficient documentation

## 2022-05-03 DIAGNOSIS — G4733 Obstructive sleep apnea (adult) (pediatric): Secondary | ICD-10-CM | POA: Insufficient documentation

## 2022-05-03 DIAGNOSIS — Z833 Family history of diabetes mellitus: Secondary | ICD-10-CM | POA: Diagnosis not present

## 2022-05-03 DIAGNOSIS — D122 Benign neoplasm of ascending colon: Secondary | ICD-10-CM | POA: Diagnosis not present

## 2022-05-03 HISTORY — PX: COLONOSCOPY WITH PROPOFOL: SHX5780

## 2022-05-03 SURGERY — COLONOSCOPY WITH PROPOFOL
Anesthesia: General

## 2022-05-03 MED ORDER — SODIUM CHLORIDE 0.9 % IV SOLN: INTRAVENOUS | Status: DC

## 2022-05-03 MED ORDER — PROPOFOL 1000 MG/100ML IV EMUL
INTRAVENOUS | Status: AC
  Filled 2022-05-03: qty 100

## 2022-05-03 MED ORDER — LIDOCAINE HCL (CARDIAC) PF 100 MG/5ML IV SOSY
PREFILLED_SYRINGE | INTRAVENOUS | Status: DC | PRN
  Administered 2022-05-03: 100 mg via INTRAVENOUS

## 2022-05-03 MED ORDER — PROPOFOL 10 MG/ML IV BOLUS
INTRAVENOUS | Status: DC | PRN
  Administered 2022-05-03: 70 mg via INTRAVENOUS

## 2022-05-03 MED ORDER — PROPOFOL 500 MG/50ML IV EMUL
INTRAVENOUS | Status: DC | PRN
  Administered 2022-05-03: 150 ug/kg/min via INTRAVENOUS

## 2022-05-03 NOTE — Anesthesia Postprocedure Evaluation (Signed)
Anesthesia Post Note  Patient: Caleb [name]  Procedure(s) Performed: COLONOSCOPY WITH PROPOFOL  Patient location during evaluation: PACU Anesthesia Type: General Level of consciousness: awake and alert, oriented and patient cooperative Pain management: pain level controlled Vital Signs Assessment: post-procedure vital signs reviewed and stable Respiratory status: spontaneous breathing, nonlabored ventilation and respiratory function stable Cardiovascular status: blood pressure returned to baseline and stable Postop Assessment: adequate PO intake Anesthetic complications: no   No notable events documented.   Last Vitals:  Vitals:   05/03/22 0834 05/03/22 0844  BP: (!) 132/99 (!) 142/91  Pulse: 67 61  Resp: 18 20  Temp:    SpO2: 100% 100%    Last Pain:  Vitals:   05/03/22 0844  TempSrc:   PainSc: 0-No pain                 Reed Breech

## 2022-05-03 NOTE — Anesthesia Preprocedure Evaluation (Addendum)
Anesthesia Evaluation  Patient identified by MRN, date of birth, ID band Patient awake    Reviewed: Allergy & Precautions, NPO status , Patient's Chart, lab work & pertinent test results  History of Anesthesia Complications Negative for: history of anesthetic complications  Airway Mallampati: IV   Neck ROM: Full    Dental  (+) Missing   Pulmonary sleep apnea , COPD, Current Smoker (less than 1 ppd)Patient did not abstain from smoking.   Pulmonary exam normal breath sounds clear to auscultation       Cardiovascular hypertension, + CAD (s/p MI and stents), + Peripheral Vascular Disease (carotid stenosis) and +CHF (ischemic CM, EF 45-50%)  Normal cardiovascular exam Rhythm:Regular Rate:Normal  ECG 02/18/22: regular sinus rhythm, 68, PVC, inferior infarct- no acute changes   Neuro/Psych  Neuromuscular disease (neuropathy; chronic foot pain)    GI/Hepatic negative GI ROS,,,  Endo/Other  negative endocrine ROS    Renal/GU Renal disease (nephrolithiasis)     Musculoskeletal   Abdominal   Peds  Hematology negative hematology ROS (+)   Anesthesia Other Findings Cardiology note 02/18/22:  1.  Right-sided chest pain/coronary artery disease: Status post prior stenting of the left circumflex in 2015 with recent diagnostic catheterization in the setting of nitrate responsive right-sided chest pain, which showed moderate LAD and circumflex disease (including in-stent restenosis), and a chronic total occlusion of the distal right coronary artery with left-to-right collaterals.  No targets for intervention.  Amlodipine 2.5 mg daily was added.  Since his catheterization, he has had 2 episodes of right-sided chest pain without associated symptoms lasting a few minutes, resolving spontaneously.  Both episodes occurred at rest and specifically, while lying in bed.  ? Some degree of coronary vasospasm.  He has not had any exertional symptoms.   Continue medical therapy including aspirin, nitrate, statin, and calcium channel blocker therapy.   2.  Ischemic cardiomyopathy/heart failure with midrange ejection fraction: EF 45 to 50% with grade 1 diastolic dysfunction by recent echo.  Euvolemic on examination.  He remains on ARB therapy.  No beta-blocker in the setting of propensity for bradycardia documented in the past.   3.  Essential hypertension: Currently well-controlled on calcium channel blocker, nitrate, and diuretic therapy.   4.  Hyperlipidemia: LDL of 38 last March with normal LFTs.  Follow-up lipids and LFTs previously ordered.  Continue statin therapy.   5.  Tobacco abuse/COPD: Still smoking a few cigarettes a week.  No active wheezing.  Uses albuterol as needed.  Complete cessation advised.   6.  PVCs: Patient recently told by a provider that he has an irregular heartbeat.  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  He denies any palpitations or tachycardia.  Reassurance provided.   7.  Disposition: Follow-up in cardiology clinic in 3 months or sooner if necessary.   Reproductive/Obstetrics                             Anesthesia Physical Anesthesia Plan  ASA: 3  Anesthesia Plan: General   Post-op Pain Management:    Induction: Intravenous  PONV Risk Score and Plan: 1 and Propofol infusion, TIVA and Treatment may vary due to age or medical condition  Airway Management Planned: Natural Airway  Additional Equipment:   Intra-op Plan:   Post-operative Plan:   Informed Consent: I have reviewed the patients History and Physical, chart, labs and discussed the procedure including the risks,  benefits and alternatives for the proposed anesthesia with the patient or authorized representative who has indicated his/her understanding and acceptance.       Plan Discussed with: CRNA  Anesthesia Plan Comments: (LMA/GETA backup discussed.  Patient consented for risks  of anesthesia including but not limited to:  - adverse reactions to medications - damage to eyes, teeth, lips or other oral mucosa - nerve damage due to positioning  - sore throat or hoarseness - damage to heart, brain, nerves, lungs, other parts of body or loss of life  Informed patient about role of CRNA in peri- and intra-operative care.  Patient voiced understanding.)        Anesthesia Quick Evaluation
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
## 2022-05-03 NOTE — Transfer of Care (Signed)
Immediate Anesthesia Transfer of Care Note  Patient: Mariella Saa  Procedure(s) Performed: COLONOSCOPY WITH PROPOFOL  Patient Location: Endoscopy Unit  Anesthesia Type:General  Level of Consciousness: drowsy  Airway & Oxygen Therapy: Patient Spontanous Breathing  Post-op Assessment: Report given to RN  Post vital signs: stable  Last Vitals:  Vitals Value Taken Time  BP    Temp    Pulse    Resp    SpO2      Last Pain:  Vitals:   05/03/22 0738  TempSrc: Temporal  PainSc: 0-No pain         Complications: No notable events documented.

## 2022-05-03 NOTE — Op Note (Signed)
[facility]astroenterology Patient Name: Caleb [name] Procedure Date: 05/03/2022 6:56 AM MRN: [id number] Account #: [id number] Date of Birth: Oct 16, 1955 Admit Type: Outpatient Age: 67 Room: [facility] ENDO ROOM 1 Gender: Male Note Status: Finalized Instrument Name: Prentice Docker [id number] Procedure:             Colonoscopy Indications:           Surveillance: Personal history of colonic polyps                         (unknown histology) on last colonoscopy 5 years ago Providers:             Wyline Mood MD, MD Referring MD:          Marzella Schlein. Bacigalupo (Referring MD) Medicines:             Monitored Anesthesia Care Complications:         No immediate complications. Procedure:             Pre-Anesthesia Assessment:                        - Prior to the procedure, a History and Physical was                         performed, and patient medications, allergies and                         sensitivities were reviewed. The patient's tolerance                         of previous anesthesia was reviewed.                        - The risks and benefits of the procedure and the                         sedation options and risks were discussed with the                         patient. All questions were answered and informed                         consent was obtained.                        - ASA Grade Assessment: II - A patient with mild                         systemic disease.                        After obtaining informed consent, the colonoscope was                         passed under direct vision. Throughout the procedure,                         the patient's blood pressure, pulse, and oxygen  saturations were monitored continuously. The                         Colonoscope was introduced through the anus and                         advanced to the the cecum, identified by the                         appendiceal orifice. The colonoscopy was  performed                         with ease. The patient tolerated the procedure well.                         The quality of the bowel preparation was excellent.                         The ileocecal valve, appendiceal orifice, and rectum                         were photographed. Findings:      The perianal and digital rectal examinations were normal.      A 3 mm polyp was found in the ascending colon. The polyp was sessile.       The polyp was removed with a jumbo cold forceps. Resection and retrieval       were complete.      There was evidence of a prior end-to-end colo-colonic anastomosis in the       rectum. This was patent and was characterized by healthy appearing       mucosa. Impression:            - One 3 mm polyp in the ascending colon, removed with                         a jumbo cold forceps. Resected and retrieved.                        - Patent end-to-end colo-colonic anastomosis,                         characterized by healthy appearing mucosa. Recommendation:        - Discharge patient to home (with escort).                        - Resume previous diet.                        - Continue present medications.                        - Await pathology results.                        - Repeat colonoscopy in 5 years for surveillance. Procedure Code(s):     --- Professional ---                        [phone], Colonoscopy, flexible; with biopsy, single or  multiple Diagnosis Code(s):     --- Professional ---                        Z86.010, Personal history of colonic polyps                        D12.2, Benign neoplasm of ascending colon                        Z98.0, Intestinal bypass and anastomosis status CPT copyright 2022 American Medical Association. All rights reserved. The codes documented in this report are preliminary and upon coder review may  be revised to meet current compliance requirements. Wyline Mood, MD Wyline Mood MD, MD 05/03/2022  8:21:54 AM This report has been signed electronically. Number of Addenda: 0 Note Initiated On: 05/03/2022 6:56 AM Scope Withdrawal Time: 0 hours 6 minutes 27 seconds  Total Procedure Duration: 0 hours 7 minutes 58 seconds  Estimated Blood Loss:  Estimated blood loss: none.      [facility]

## 2022-05-04 ENCOUNTER — Encounter: Payer: Self-pay | Admitting: Gastroenterology

## 2022-05-04 LAB — SURGICAL PATHOLOGY

## 2022-05-10 ENCOUNTER — Telehealth: Payer: Self-pay | Admitting: *Deleted

## 2022-05-10 NOTE — Telephone Encounter (Signed)
-----   Message from Carlos Levering, NP sent at 05/10/2022  4:49 PM EDT ----- Regarding: FW: Clinical Hey Dietrick Barris,   There are coming to me as Cc'd charts all from [facility]. Is there education we need to do for this office for the requests to come in correctly. From what I can tell, it is being sent directly to the physician. This patient has a visit coming up with Ward Givens. I added pre-op clearance to the visit notes.   DW ----- Message ----- From: Antonieta Iba, MD Sent: 05/08/2022   2:26 PM EDT To: Avie Arenas, CMA; Cv Div Preop Subject: FW: Clinical                                   Will forward to preop pool Thx Gollan  ----- Message ----- From: Avie Arenas, CMA Sent: 04/27/2022  11:24 AM EDT To: Antonieta Iba, MD Subject: Clinical

## 2022-05-10 NOTE — Telephone Encounter (Signed)
See notes from Carlos Levering, NP in regards to request being sent directly to providers. I have presented the matter an number of times to please not send the clearance directly to the MD.  I have educated that clearance request just needs to be faxed to [phone] or [phone]. Sending it directly to the provider leaves room for a clearance request to be missed as the may not be in the office at the time or may be on vacation. I have stated our protocol is to fax the request to [phone] or [phone] attn: pre op team and we will take it from there.    Pt has appt 05/20/22 with Caleb Givens, FNP. I will update all parties involved.     Pre-operative Risk Assessment    Patient Name: Caleb [name]  DOB: [birth year] MRN: [id number]      Request for Surgical Clearance    Procedure:   COLONOSCOPY  Date of Surgery:  Clearance TBD                                 Surgeon:  NOT LISTED Surgeon's Group or Practice Name:  [facility] Phone number:  [phone] Fax number:  [phone]   Type of Clearance Requested:   - Medical ; NO MEDICATIONS LISTED AS NEEDING TO BE HELD   Type of Anesthesia:  General    Additional requests/questions:    Caleb [name]   05/10/2022, 5:55 PM   Caleb [name], CMA on 04/27/2022     [facility] at [facility] [address] [place], [place]  [address] Phone:  [phone]   Fax:  [phone]   04/27/2022   Patient Name: Caleb [name]   DOB: 01/23/1955   To Dr. Mariah [name]:   Caleb [name] needs to be re-scheduled for a colonoscopy in the near future, under GENERAL anesthesia.  Clearance was originally granted for him to have his colonoscopy procedure on 04/27/22.  He was seen in ER on 04/26/22 for Atypical Chest Pain, therefore his procedure had to be canceled.   Please FAX a note of Medical Clearance to [phone], ATTENTION: Caleb [name], CMA   To advise if this patient will  require an office visit or further medical work-up before clearance can be given please call [phone] (desk number) and leave a message with the triage nurse.   Thank you      [facility]     _____  Patient is cleared to have procedure   _____  Patient is NOT cleared to have procedure      Additional notes:_________________________________________________________   ______________________________________________________________________     _______________________________________     ________________ Physician Signature                                                    Date    [facility] at [facility]

## 2022-05-18 ENCOUNTER — Encounter: Payer: Self-pay | Admitting: Dermatology

## 2022-05-18 ENCOUNTER — Other Ambulatory Visit: Payer: Self-pay | Admitting: Family Medicine

## 2022-05-18 ENCOUNTER — Ambulatory Visit: Payer: Medicaid Other | Admitting: Dermatology

## 2022-05-18 VITALS — BP 122/69 | HR 68

## 2022-05-18 DIAGNOSIS — Z7189 Other specified counseling: Secondary | ICD-10-CM

## 2022-05-18 DIAGNOSIS — L409 Psoriasis, unspecified: Secondary | ICD-10-CM | POA: Diagnosis not present

## 2022-05-18 DIAGNOSIS — Z79620 Long term (current) use of immunosuppressive biologic: Secondary | ICD-10-CM | POA: Diagnosis not present

## 2022-05-18 DIAGNOSIS — G8929 Other chronic pain: Secondary | ICD-10-CM

## 2022-05-18 DIAGNOSIS — Z79899 Other long term (current) drug therapy: Secondary | ICD-10-CM

## 2022-05-18 MED ORDER — COSENTYX SENSOREADY (300 MG) 150 MG/ML ~~LOC~~ SOAJ
SUBCUTANEOUS | 2 refills | Status: DC
Start: 2022-05-18 — End: 2022-05-24

## 2022-05-18 NOTE — Progress Notes (Signed)
Follow-Up Visit   Subjective  Caleb [name] is a 67 y.o. male who presents for the following: 6 month psoriasis follow up. Patient reports he has been staying clear while on Cosentyx, Methotrexate 6 tabs weekly, and using topicals as needed when flared (not using now). Reports took last Cosentyx injection 3 weeks ago and was unable to get any more refills of Cosentyx. Reports noticing some psoriasis at right lower leg. No side effects, no infections.  Pt c/o of persistent hip pain that hasn't improved with treatment.   The following portions of the chart were reviewed this encounter and updated as appropriate: medications, allergies, medical history  Review of Systems:  No other skin or systemic complaints except as noted in HPI or Assessment and Plan.  Objective  Well appearing patient in no apparent distress; mood and affect are within normal limits.  Areas Examined: Scalp, ears, legs  Relevant exam findings are noted in the Assessment and Plan.   Assessment & Plan   PSORIASIS With arthritis  b/l ears, arms, legs  BSA < 1 % Exam: pink scaly small plaque on right ankle, small pink scaly patch at frontal scalp, ears are clear at exam   Chronic condition with duration or expected duration over one year. Currently well-controlled. On Cosentyx and MTX    Ordered TB Gold lab test Reviewed  CMP 4/24 and CBC 2/24- wnl   Continue MTX 2.5mg  6 po qwk from rheumatologist for arthritis Recommend follow up with orthopedic surgeon about hip pain Continue Cosentyx injections 300mg  q 4 wks. Pt was approved and patient is self injecting at home. Submitted new rx to [facility] Cream Apply to aas ears and eyebrows qd  Continue fluocinolone solution Apply 2 gtts to ears and eyebrows BID prn itchy rash until improved. Continue clobetasol/CeraVe mix 1-2 times daily prn for less severe areas. Avoid applying to face, groin, and axilla. Use as directed. Long-term use can cause  thinning of the skin. Continue clobetasol cream to more severe areas 1-2 times daily prn. Avoid applying to face, groin, and axilla. Use as directed. Long-term use can cause thinning of the skin.   Reviewed risks of biologics including immunosuppression, infections, injection site reaction, and failure to improve condition. Goal is control of skin condition, not cure.  Some older biologics such as Humira and Enbrel may slightly increase risk of malignancy and may worsen congestive heart failure.  Talz and Cosentyx may cause inflammatory bowel disease to flare. The use of biologics requires long term medication management, including periodic office visits and monitoring of blood work.   Topical steroids (such as triamcinolone, fluocinolone, fluocinonide, mometasone, clobetasol, halobetasol, betamethasone, hydrocortisone) can cause thinning and lightening of the skin if they are used for too long in the same area. Your physician has selected the right strength medicine for your problem and area affected on the body. Please use your medication only as directed by your physician to prevent side effects.   Counseling on psoriasis and coordination of care  Psoriasis - severe on systemic "biologic" treatment injections.  Psoriasis is a chronic non-curable, but treatable genetic/hereditary disease that may have other systemic features affecting other organ systems such as joints (Psoriatic Arthritis).  It is linked with heart disease, inflammatory bowel disease, non-alcoholic fatty liver disease, and depression. Significant skin psoriasis and/or psoriatic arthritis may have significant symptoms and affects activities of daily activity and often benefits from systemic "biologic" injection treatments.  These "biologic" treatments have some potential side effects  including immunosuppression and require pre-treatment laboratory screening and periodic laboratory monitoring and periodic in person evaluation and monitoring  by the attending dermatologist physician (long term medication management).    Return in about 6 months (around 11/18/2022) for psoriasis follow up.  I, Asher Muir, CMA, am acting as scribe for Willeen Niece, MD.   Documentation: I have reviewed the above documentation for accuracy and completeness, and I agree with the above.  Willeen Niece, MD

## 2022-05-18 NOTE — Patient Instructions (Addendum)
Continue Methotrexate 2.5mg  6 by mouth weekly from rheumatologist for arthritis   Continue Cosentyx injections 300mg     Continue Zoryve Cream Apply to aas ears and eyebrows daily   Continue fluocinolone solution Apply 2 drops to ears and eyebrows 2 times as needed itchy rash until improved.   Continue clobetasol/CeraVe mix 1-2 times daily as needed for less severe areas. Avoid applying to face, groin, and axilla. Use as directed. Long-term use can cause thinning of the skin.   Continue clobetasol cream to more severe areas 1-2 times daily prn. Avoid applying to face, groin, and axilla. Use as directed. Long-term use can cause thinning of the skin.   Due to recent changes in healthcare laws, you may see results of your pathology and/or laboratory studies on MyChart before the doctors have had a chance to review them. We understand that in some cases there may be results that are confusing or concerning to you. Please understand that not all results are received at the same time and often the doctors may need to interpret multiple results in order to provide you with the best plan of care or course of treatment. Therefore, we ask that you please give [place] 2 business days to thoroughly review all your results before contacting the office for clarification. Should we see a critical lab result, you will be contacted sooner.   If You Need Anything After Your Visit  If you have any questions or concerns for your doctor, please call our main line at [phone] and press option 4 to reach your doctor's medical assistant. If no one answers, please leave a voicemail as directed and we will return your call as soon as possible. Messages left after 4 pm will be answered the following business day.   You may also send [place] a message via MyChart. We typically respond to MyChart messages within 1-2 business days.  For prescription refills, please ask your pharmacy to contact our office. Our fax number is  [phone].  If you have an urgent issue when the clinic is closed that cannot wait until the next business day, you can page your doctor at the number below.    Please note that while we do our best to be available for urgent issues outside of office hours, we are not available 24/7.   If you have an urgent issue and are unable to reach [place], you may choose to seek medical care at your doctor's office, retail clinic, urgent care center, or emergency room.  If you have a medical emergency, please immediately call 911 or go to the emergency department.  Pager Numbers  - Dr. Gwen Pounds: [phone]  - Dr. Neale Burly: [phone]  - Dr. Roseanne Reno: [phone]  In the event of inclement weather, please call our main line at [phone] for an update on the status of any delays or closures.  Dermatology Medication Tips: Please keep the boxes that topical medications come in in order to help keep track of the instructions about where and how to use these. Pharmacies typically print the medication instructions only on the boxes and not directly on the medication tubes.   If your medication is too expensive, please contact our office at [phone] option 4 or send [place] a message through MyChart.   We are unable to tell what your co-pay for medications will be in advance as this is different depending on your insurance coverage. However, we may be able to find a substitute medication at lower cost or fill out paperwork  to get insurance to cover a needed medication.   If a prior authorization is required to get your medication covered by your insurance company, please allow [place] 1-2 business days to complete this process.  Drug prices often vary depending on where the prescription is filled and some pharmacies may offer cheaper prices.  The website www.goodrx.com contains coupons for medications through different pharmacies. The prices here do not account for what the cost may be with help from  insurance (it may be cheaper with your insurance), but the website can give you the price if you did not use any insurance.  - You can print the associated coupon and take it with your prescription to the pharmacy.  - You may also stop by our office during regular business hours and pick up a GoodRx coupon card.  - If you need your prescription sent electronically to a different pharmacy, notify our office through [facility] or by phone at [phone] option 4.     Si Usted Necesita Algo Despus de Su Visita  Tambin puede enviarnos un mensaje a travs de Pharmacist, community. Por lo general respondemos a los mensajes de MyChart en el transcurso de 1 a 2 das hbiles.  Para renovar recetas, por favor pida a su farmacia que se ponga en contacto con nuestra oficina. Harland Dingwall de fax es [place] [phone].  Si tiene un asunto urgente cuando la clnica est cerrada y que no puede esperar hasta el siguiente da hbil, puede llamar/localizar a su doctor(a) al nmero que aparece a continuacin.   Por favor, tenga en cuenta que aunque hacemos todo lo posible para estar disponibles para asuntos urgentes fuera del horario de [place], no estamos disponibles las 24 horas del da, los 7 das de la [place].   Si tiene un problema urgente y no puede comunicarse con nosotros, puede optar por buscar atencin mdica  en el consultorio de su doctor(a), en una clnica privada, en un centro de atencin urgente o en una sala de emergencias.  Si tiene Engineering geologist, por favor llame inmediatamente al 911 o vaya a la sala de emergencias.  Nmeros de bper  - Dr. Nehemiah Massed: [phone]  - Dra. Moye: [phone]  - Dra. Nicole Kindred: [phone]  En caso de inclemencias del [place], por favor llame a [name] principal al [phone] para una actualizacin sobre el [place] de cualquier retraso o cierre.  Consejos para la medicacin en dermatologa: Por favor, guarde las cajas en las que vienen los  medicamentos de uso tpico para ayudarle a seguir las instrucciones sobre dnde y cmo usarlos. Las farmacias generalmente imprimen las instrucciones del medicamento slo en las cajas y no directamente en los tubos del [place].   Si su medicamento es muy caro, por favor, pngase en contacto con Zigmund Daniel llamando al [phone] y presione la opcin 4 o envenos un mensaje a travs de Pharmacist, community.   No podemos decirle cul ser su copago por los medicamentos por adelantado ya que esto es diferente dependiendo de la cobertura de su seguro. Sin embargo, es posible que podamos encontrar un medicamento sustituto a Electrical engineer un formulario para que el seguro cubra el medicamento que se considera necesario.   Si se requiere una autorizacin previa para que su compaa de seguros [place] su medicamento, por favor permtanos de 1 a 2 das hbiles para completar este proceso.  Los precios de los medicamentos varan con frecuencia dependiendo del Environmental consultant de dnde se surte la receta y [place]  pueden ofrecer precios ms baratos.  El sitio web www.goodrx.com tiene cupones para medicamentos de Airline pilot. Los precios aqu no tienen en cuenta lo que podra costar con la ayuda del seguro (puede ser ms barato con su seguro), pero el sitio web puede darle el precio si no utiliz Research scientist (physical sciences).  - Puede imprimir el cupn correspondiente y llevarlo con su receta a la farmacia.  - Tambin puede pasar por nuestra oficina durante el horario de atencin regular y Charity fundraiser una tarjeta de cupones de GoodRx.  - Si necesita que su receta se enve electrnicamente a una farmacia diferente, informe a nuestra oficina a travs de MyChart de [place] o por telfono llamando al [phone] y presione la opcin 4.

## 2022-05-19 NOTE — Telephone Encounter (Signed)
Requested medication (s) are due for refill today: yes  Requested medication (s) are on the active medication list: yes  Last refill:  03/24/22  Future visit scheduled: yes  Notes to clinic:  Unable to refill per protocol, cannot delegate.      Requested Prescriptions  Pending Prescriptions Disp Refills   methocarbamol (ROBAXIN) 750 MG tablet [Pharmacy Med Name: METHOCARBAMOL 750 MG TABLET] 90 tablet 0    Sig: TAKE 1 TABLET (750 MG TOTAL) BY MOUTH EVERY 8 (EIGHT) HOURS AS NEEDED FOR MUSCLE SPASMS.     Not Delegated - Analgesics:  Muscle Relaxants Failed - 05/18/2022  7:32 PM      Failed - This refill cannot be delegated      Passed - Valid encounter within last 6 months    Recent Outpatient Visits           1 month ago Encounter for annual physical exam   [place] [facility] [place], Marzella Schlein, MD   2 months ago Cough syncope   [place] [facility] [place], Marzella Schlein, MD   3 months ago Chronic foot pain (1ry area of Pain) (Right)   [place] [facility] Beryle Flock, Marzella Schlein, MD   7 months ago Prediabetes   [place] [facility] Erasmo Downer, MD   10 months ago Chronic sacroiliac joint pain (Bilateral)   [place] [facility], [place], [place]       Future Appointments             Tomorrow Brion Aliment, Dois Davenport, NP [facility] [place]   In 1 month Bac[place]al[place], Marzella Schlein, MD [facility]   In 5 months Richardo Hanks, Laurette Schimke, MD [facility] [place]   In 6 months Willeen Niece, MD [facility]

## 2022-05-20 ENCOUNTER — Ambulatory Visit: Payer: Medicaid Other | Attending: Nurse Practitioner | Admitting: Nurse Practitioner

## 2022-05-20 ENCOUNTER — Encounter: Payer: Self-pay | Admitting: Nurse Practitioner

## 2022-05-20 VITALS — BP 100/66 | HR 70 | Ht 68.0 in | Wt 193.4 lb

## 2022-05-20 DIAGNOSIS — J449 Chronic obstructive pulmonary disease, unspecified: Secondary | ICD-10-CM

## 2022-05-20 DIAGNOSIS — I5022 Chronic systolic (congestive) heart failure: Secondary | ICD-10-CM

## 2022-05-20 DIAGNOSIS — I10 Essential (primary) hypertension: Secondary | ICD-10-CM | POA: Diagnosis not present

## 2022-05-20 DIAGNOSIS — I25110 Atherosclerotic heart disease of native coronary artery with unstable angina pectoris: Secondary | ICD-10-CM | POA: Diagnosis not present

## 2022-05-20 DIAGNOSIS — E785 Hyperlipidemia, unspecified: Secondary | ICD-10-CM

## 2022-05-20 DIAGNOSIS — I255 Ischemic cardiomyopathy: Secondary | ICD-10-CM | POA: Diagnosis not present

## 2022-05-20 DIAGNOSIS — Z720 Tobacco use: Secondary | ICD-10-CM | POA: Diagnosis not present

## 2022-05-20 MED ORDER — VARENICLINE TARTRATE 0.5 MG PO TABS: ORAL_TABLET | ORAL | 0 refills | Status: DC

## 2022-05-20 MED ORDER — ISOSORBIDE MONONITRATE ER 30 MG PO TB24: 30.0000 mg | ORAL_TABLET | Freq: Every day | ORAL | 1 refills | Status: DC

## 2022-05-20 MED ORDER — VARENICLINE TARTRATE 1 MG PO TABS: 1.0000 mg | ORAL_TABLET | Freq: Two times a day (BID) | ORAL | 2 refills | Status: DC

## 2022-05-20 MED ORDER — LOSARTAN POTASSIUM 50 MG PO TABS: 50.0000 mg | ORAL_TABLET | Freq: Every day | ORAL | 1 refills | Status: DC

## 2022-05-20 NOTE — Patient Instructions (Signed)
Medication Instructions:  Chantix:   Take one 0.5 mg tablet by mouth once daily for 3 days,  Then increase to one 0.5 mg tablet twice daily for 4 days,  Then increase to one 1 mg tablet twice daily. This is what you will remain on until completed. Two prescriptions have been sent into your pharmacy. One for the starter pack for the first week. You may pick up the Chantix 1 mg twice daily after the starter pack has been completed.   INCREASE the Isosorbide to 30 mg once daily DECREASE the Losartan to 50 mg once daily  *If you need a refill on your cardiac medications before your next appointment, please call your pharmacy*   Lab Work: None ordered If you have labs (blood work) drawn today and your tests are completely normal, you will receive your results only by: MyChart Message (if you have MyChart) OR A paper copy in the mail If you have any lab test that is abnormal or we need to change your treatment, we will call you to review the results.   Testing/Procedures: None ordered   Follow-Up: At [facility], you and your health needs are our priority.  As part of our continuing mission to provide you with exceptional heart care, we have created designated Provider Care Teams.  These Care Teams include your primary Cardiologist (physician) and Advanced Practice Providers (APPs -  Physician Assistants and Nurse Practitioners) who all work together to provide you with the care you need, when you need it.  We recommend signing up for the patient portal called "MyChart".  Sign up information is provided on this After Visit Summary.  MyChart is used to connect with patients for Virtual Visits (Telemedicine).  Patients are able to view lab/test results, encounter notes, upcoming appointments, etc.  Non-urgent messages can be sent to your provider as well.   To learn more about what you can do with MyChart, go to [email or web address].    Your next appointment:   3  month(s)  Provider:   You may see Julien Nordmann, MD or one of the following Advanced Practice Providers on your designated Care Team:   Nicolasa Ducking, NP
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
## 2022-05-21 DIAGNOSIS — M47812 Spondylosis without myelopathy or radiculopathy, cervical region: Secondary | ICD-10-CM | POA: Diagnosis not present

## 2022-05-21 DIAGNOSIS — Z79899 Other long term (current) drug therapy: Secondary | ICD-10-CM | POA: Diagnosis not present

## 2022-05-21 DIAGNOSIS — M79671 Pain in right foot: Secondary | ICD-10-CM | POA: Diagnosis not present

## 2022-05-21 DIAGNOSIS — Z6829 Body mass index (BMI) 29.0-29.9, adult: Secondary | ICD-10-CM | POA: Diagnosis not present

## 2022-05-21 DIAGNOSIS — G894 Chronic pain syndrome: Secondary | ICD-10-CM | POA: Diagnosis not present

## 2022-05-21 LAB — QUANTIFERON-TB GOLD PLUS
QuantiFERON Mitogen Value: >10 IU/mL
QuantiFERON Nil Value: 0 IU/mL
QuantiFERON TB1 Ag Value: 0.02 IU/mL
QuantiFERON TB2 Ag Value: 0.01 IU/mL
QuantiFERON-TB Gold Plus: NEGATIVE

## 2022-05-24 ENCOUNTER — Telehealth: Payer: Self-pay

## 2022-05-24 DIAGNOSIS — L409 Psoriasis, unspecified: Secondary | ICD-10-CM

## 2022-05-24 MED ORDER — COSENTYX SENSOREADY (300 MG) 150 MG/ML ~~LOC~~ SOAJ
SUBCUTANEOUS | 5 refills | Status: DC
Start: 2022-05-24 — End: 2022-07-29

## 2022-05-24 NOTE — Telephone Encounter (Signed)
Advised pt of lab results and Cosentyx refills sent to [facility]

## 2022-05-24 NOTE — Telephone Encounter (Signed)
-----   Message from Willeen Niece, MD sent at 05/24/2022 12:55 PM EDT ----- TB negative, send in 6 mo rfs Cosentyx- please call patient

## 2022-05-25 DIAGNOSIS — M47812 Spondylosis without myelopathy or radiculopathy, cervical region: Secondary | ICD-10-CM | POA: Diagnosis not present

## 2022-05-27 ENCOUNTER — Telehealth: Payer: Self-pay

## 2022-05-27 NOTE — Progress Notes (Signed)
I,Joseline E Rosas,acting as a scribe for Shirlee Latch, MD.,have documented all relevant documentation on the behalf of Shirlee Latch, MD,as directed by  Shirlee Latch, MD while in the presence of Shirlee Latch, MD.   Established patient visit   Patient: Caleb [name]   DOB: 1955/08/27   67 y.o. Male  MRN: [id number] Visit Date: 05/28/2022  Today's healthcare provider: Shirlee Latch, MD   Chief Complaint  Patient presents with   arm numbness   Subjective    HPI  Patient reports that last week during the night his left arm felt asleep. Happened at work with holding things overhead. Resolved quickly.  Patient reports that last night he had chest pain and took nitroglycerin and it was not better and went ahead and took a second one after 10 minutes.   Today he reports no chest pain and no sob, dizziness, arm pain no blurry vision.  Medications: Outpatient Medications Prior to Visit  Medication Sig   albuterol (VENTOLIN HFA) 108 (90 Base) MCG/ACT inhaler Inhale 2 puffs into the lungs every 6 (six) hours as needed for wheezing or shortness of breath.   amLODipine (NORVASC) 2.5 MG tablet Take 1 tablet (2.5 mg total) by mouth daily.   aspirin EC 81 MG tablet Take 81 mg by mouth daily.    cetirizine (ZYRTEC) 10 MG tablet TAKE 1 TABLET BY MOUTH EVERY DAY   COSENTYX SENSOREADY, 300 MG, 150 MG/ML SOAJ Inject 300mg  sub q every 4 weeks for maintenance   fluticasone (FLONASE) 50 MCG/ACT nasal spray SPRAY 2 SPRAYS INTO EACH NOSTRIL EVERY DAY   folic acid (FOLVITE) 1 MG tablet Take by mouth.   hydrochlorothiazide (HYDRODIURIL) 25 MG tablet TAKE 1 TABLET (25 MG TOTAL) BY MOUTH DAILY.   HYDROcodone-acetaminophen (NORCO) 7.5-325 MG tablet Take 1 tablet by mouth every 6 (six) hours as needed for severe pain. Must last 30 days.   HYDROcodone-acetaminophen (NORCO) 7.5-325 MG tablet Take 1 tablet by mouth every 6 (six) hours as needed for severe pain. Must last 30 days.    ibuprofen (ADVIL) 800 MG tablet Take 1 tablet (800 mg total) by mouth every 6 (six) hours as needed.   isosorbide mononitrate (IMDUR) 30 MG 24 hr tablet Take 1 tablet (30 mg total) by mouth daily.   losartan (COZAAR) 50 MG tablet Take 1 tablet (50 mg total) by mouth daily.   Magnesium Hydroxide (MAGNESIA PO) Take 1 tablet by mouth 3 (three) times daily.   Melatonin 10 MG CAPS Take 20 mg by mouth at bedtime.   methocarbamol (ROBAXIN) 750 MG tablet TAKE 1 TABLET (750 MG TOTAL) BY MOUTH EVERY 8 (EIGHT) HOURS AS NEEDED FOR MUSCLE SPASMS.   methotrexate (RHEUMATREX) 2.5 MG tablet Take 15 mg by mouth once a week.   montelukast (SINGULAIR) 10 MG tablet Take 1 tablet (10 mg total) by mouth at bedtime.   naloxone (NARCAN) nasal spray 4 mg/0.1 mL Use in case of opioid overdose   nitroGLYCERIN (NITROSTAT) 0.4 MG SL tablet Place 1 tablet (0.4 mg total) under the tongue every 5 (five) minutes as needed for chest pain.   pregabalin (LYRICA) 200 MG capsule Take 1 capsule (200 mg total) by mouth in the morning, at noon, and at bedtime.   rosuvastatin (CRESTOR) 40 MG tablet TAKE 1 TABLET BY MOUTH EVERY DAY   triamcinolone ointment (KENALOG) 0.5 % Apply topically.   TURMERIC PO Take 1 capsule by mouth daily.   umeclidinium-vilanterol (ANORO ELLIPTA) 62.5-25 MCG/ACT AEPB Inhale 1  puff into the lungs daily.   varenicline (CHANTIX CONTINUING MONTH PAK) 1 MG tablet Take 1 tablet (1 mg total) by mouth 2 (two) times daily.   varenicline (CHANTIX) 0.5 MG tablet Take one tablet once daily for 3 days. Then take one tablet twice daily for 4 days.   HYDROcodone-acetaminophen (NORCO) 7.5-325 MG tablet Take 1 tablet by mouth every 6 (six) hours as needed for severe pain. Must last 30 days.   [DISCONTINUED] buPROPion (WELLBUTRIN XL) 150 MG 24 hr tablet Take 1 tablet (150 mg total) by mouth daily.   [DISCONTINUED] cholecalciferol (VITAMIN D3) 25 MCG (1000 UNIT) tablet Take 2,000 Units by mouth daily.   No facility-administered  medications prior to visit.    Review of Systems per HPI     Objective    BP 120/79 (BP Location: Left Arm, Patient Position: Sitting, Cuff Size: Large)   Pulse 74   Temp 98.3 F (36.8 C) (Oral)   Resp 16   Ht 5\' 8"  (1.727 m)   Wt 197 lb 14.4 oz (89.8 kg)   BMI 30.09 kg/m     Physical Exam Vitals reviewed.  Constitutional:      General: He is not in acute distress.    Appearance: Normal appearance. He is not diaphoretic.  HENT:     Head: Normocephalic and atraumatic.  Eyes:     General: No scleral icterus.    Conjunctiva/sclera: Conjunctivae normal.  Cardiovascular:     Rate and Rhythm: Normal rate and regular rhythm.     Heart sounds: Normal heart sounds. No murmur heard. Pulmonary:     Effort: Pulmonary effort is normal. No respiratory distress.     Breath sounds: Normal breath sounds. No wheezing or rhonchi.  Musculoskeletal:     Cervical back: Neck supple.     Right lower leg: No edema.     Left lower leg: No edema.  Lymphadenopathy:     Cervical: No cervical adenopathy.  Skin:    General: Skin is warm and dry.     Findings: No rash.  Neurological:     Mental Status: He is alert and oriented to person, place, and time. Mental status is at baseline.     Cranial Nerves: Cranial nerves 2-12 are intact.     Sensory: Sensation is intact.     Motor: Motor function is intact.     Coordination: Coordination is intact.  Psychiatric:        Mood and Affect: Mood normal.        Behavior: Behavior normal.      EKG: NSR with old inferior infarct, no change from previous  No results found for any visits on 05/28/22.  Assessment & Plan     Problem List Items Addressed This Visit       Cardiovascular and Mediastinum   CAD (coronary artery disease), native coronary artery    Chronic and stable No EKG changes Recent Imdur change per cardiology No additional workup/change currently  No current chest pain        Other   Chest pain - Primary   Relevant  Orders   EKG 12-Lead   Arm numbness left    Intermittent L arm numbness that is positional and self-resolving Neuro intact without signs of stroke Patient with known cervical foraminal stenosis which is most likely culprit Discussed signs/symptoms of stroke and emergent precautions Planning for upcoming neck procedure with ORtho        Return for as scheduled.  I, Shirlee Latch, MD, have reviewed all documentation for this visit. The documentation on 05/28/22 for the exam, diagnosis, procedures, and orders are all accurate and complete.   Takela Varden, Marzella Schlein, MD, MPH [facility]

## 2022-05-27 NOTE — Telephone Encounter (Signed)
I have called patient about his appointment on 05/28/22.  I told him I was calling about the appointment tomorrow and the first thing he said was he wanted to "speak to a nurse before doing all this but he never got to".  Patient then went on to explain that 1 week ago today while at work he had his arms over his head putting cans of soda into an iccee machine.  When he finished and brought his arms down the left arm felt like it was dead and he couldn't do anything with it. This lasted for about 30 minutes.  Then he had an episode the other day where his left arm was asleep after he was lying down for a while.  Patient states he is being treated for his neck by Ortho but they told him they did not think it was his neck.   Patient states that he has not had any further episodes and feels fine right now. Patient was instructed to keep appointment tomorrow but was also cautioned to seek immediate treatment at the ER if he should have any further events.

## 2022-05-27 NOTE — Telephone Encounter (Signed)
Noted  

## 2022-05-28 ENCOUNTER — Ambulatory Visit: Payer: Medicaid Other | Admitting: Family Medicine

## 2022-05-28 ENCOUNTER — Encounter: Payer: Self-pay | Admitting: Family Medicine

## 2022-05-28 VITALS — BP 120/79 | HR 74 | Temp 98.3°F | Resp 16 | Ht 68.0 in | Wt 197.9 lb

## 2022-05-28 DIAGNOSIS — R200 Anesthesia of skin: Secondary | ICD-10-CM

## 2022-05-28 DIAGNOSIS — I25118 Atherosclerotic heart disease of native coronary artery with other forms of angina pectoris: Secondary | ICD-10-CM | POA: Diagnosis not present

## 2022-05-28 DIAGNOSIS — R079 Chest pain, unspecified: Secondary | ICD-10-CM

## 2022-05-28 NOTE — Assessment & Plan Note (Signed)
Chronic and stable No EKG changes Recent Imdur change per cardiology No additional workup/change currently  No current chest pain

## 2022-05-28 NOTE — Assessment & Plan Note (Signed)
Intermittent L arm numbness that is positional and self-resolving Neuro intact without signs of stroke Patient with known cervical foraminal stenosis which is most likely culprit Discussed signs/symptoms of stroke and emergent precautions Planning for upcoming neck procedure with ORtho

## 2022-06-03 ENCOUNTER — Ambulatory Visit: Payer: Medicaid Other | Admitting: Pain Medicine

## 2022-06-04 ENCOUNTER — Other Ambulatory Visit: Payer: Self-pay | Admitting: Family Medicine

## 2022-06-04 DIAGNOSIS — G8929 Other chronic pain: Secondary | ICD-10-CM

## 2022-06-04 NOTE — Telephone Encounter (Signed)
Requested medications are due for refill today.  Unsure  Requested medications are on the active medications list.  yes  Last refill. 05/19/2022 #90 0 rf  Future visit scheduled.   yes  Notes to clinic.  Refill not delegated.    Requested Prescriptions  Pending Prescriptions Disp Refills   methocarbamol (ROBAXIN) 750 MG tablet [Pharmacy Med Name: METHOCARBAMOL 750 MG TABLET] 90 tablet 0    Sig: TAKE 1 TABLET (750 MG TOTAL) BY MOUTH EVERY 8 (EIGHT) HOURS AS NEEDED FOR MUSCLE SPASMS.     Not Delegated - Analgesics:  Muscle Relaxants Failed - 06/04/2022  1:26 PM      Failed - This refill cannot be delegated      Passed - Valid encounter within last 6 months    Recent Outpatient Visits           1 week ago Chest pain, unspecified type   [facility] [place], Marzella Schlein, MD   2 months ago Encounter for annual physical exam   [place] [facility] [place], Marzella Schlein, MD   2 months ago Cough syncope   [facility], Marzella Schlein, MD   4 months ago Chronic foot pain (1ry area of Pain) (Right)   [place] [facility] Flock, Marzella Schlein, MD   7 months ago Prediabetes   [facility] [place], Marzella Schlein, MD       Future Appointments             In 1 month Bacigalupo, Marzella Schlein, MD [facility]   In 2 months Brion Aliment, Dois Davenport, NP [facility] [place] [facility]   In 4 months Richardo Hanks, Laurette Schimke, MD [facility] [place]   In 5 months Willeen Niece, MD [facility]

## 2022-06-07 DIAGNOSIS — M7062 Trochanteric bursitis, left hip: Secondary | ICD-10-CM | POA: Diagnosis not present

## 2022-06-07 DIAGNOSIS — Z7960 Long term (current) use of unspecified immunomodulators and immunosuppressants: Secondary | ICD-10-CM | POA: Diagnosis not present

## 2022-06-07 DIAGNOSIS — L400 Psoriasis vulgaris: Secondary | ICD-10-CM | POA: Diagnosis not present

## 2022-06-07 DIAGNOSIS — L4050 Arthropathic psoriasis, unspecified: Secondary | ICD-10-CM | POA: Diagnosis not present

## 2022-06-10 ENCOUNTER — Ambulatory Visit: Payer: Medicaid Other | Admitting: Adult Health

## 2022-06-10 ENCOUNTER — Encounter: Payer: Self-pay | Admitting: Adult Health

## 2022-06-10 VITALS — BP 118/70 | HR 72 | Temp 97.8°F | Ht 68.0 in | Wt 195.6 lb

## 2022-06-10 DIAGNOSIS — Z720 Tobacco use: Secondary | ICD-10-CM | POA: Diagnosis not present

## 2022-06-10 DIAGNOSIS — G4733 Obstructive sleep apnea (adult) (pediatric): Secondary | ICD-10-CM

## 2022-06-10 DIAGNOSIS — J449 Chronic obstructive pulmonary disease, unspecified: Secondary | ICD-10-CM

## 2022-06-10 NOTE — Assessment & Plan Note (Signed)
Good control and compliance on nocturnal BiPAP.  Patient is encouraged to increase daily usage.  Is having some trouble with his machine and air leakage.  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   Wear BIPAP At bedtime  -wear at least 6hrs each night  Do not drive if sleepy  Work on healthy weight  Do not drive if sleepy.  Contact DME to look at machine.   Follow up with Dr. Melida Quitter or Markham Dumlao NP in 3 months with PFT and As needed

## 2022-06-10 NOTE — Assessment & Plan Note (Signed)
Smoking cessation encouraged.  Patient to continue with his current plan.  Has a quit date for July 4.

## 2022-06-10 NOTE — Patient Instructions (Addendum)
Continue on ANORO 1 puff daily  Work on not smoking Albuterol inhaler As needed   saline nasal spray 2 puffs Twice daily   saline nasal gel At bedtime  .   Wear BIPAP At bedtime  -wear at least 6hrs each night  Do not drive if sleepy  Work on healthy weight  Do not drive if sleepy.  Contact DME to look at machine.   Follow up with Dr. Melida Quitter or Kartier Bennison NP in 3 months with PFT and As needed

## 2022-06-10 NOTE — Assessment & Plan Note (Addendum)
Appears compensated on present regimen.  Patient is encouraged on smoking cessation. Check PFT on return  Plan  Patient Instructions  Continue on ANORO 1 puff daily  Work on not smoking Albuterol inhaler As needed   saline nasal spray 2 puffs Twice daily   saline nasal gel At bedtime  .   Wear BIPAP At bedtime  -wear at least 6hrs each night  Do not drive if sleepy  Work on healthy weight  Do not drive if sleepy.  Contact DME to look at machine.   Follow up with Dr. Melida Quitter or Jessyca Sloan NP in 3 months with PFT and As needed
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
## 2022-06-15 ENCOUNTER — Telehealth: Payer: Self-pay

## 2022-06-15 ENCOUNTER — Ambulatory Visit: Payer: Medicaid Other | Attending: Pain Medicine | Admitting: Pain Medicine

## 2022-06-15 DIAGNOSIS — Z91199 Patient's noncompliance with other medical treatment and regimen due to unspecified reason: Secondary | ICD-10-CM

## 2022-06-15 NOTE — Progress Notes (Signed)
Visit cancelled by patient on day of appointment, due to "not feeling good". (06/15/2022) VV offered, but declined by patient.

## 2022-06-15 NOTE — Telephone Encounter (Signed)
LM for patient to call office for pre virtual appointment questions.  

## 2022-06-17 DIAGNOSIS — M47812 Spondylosis without myelopathy or radiculopathy, cervical region: Secondary | ICD-10-CM | POA: Diagnosis not present

## 2022-06-28 NOTE — Progress Notes (Unsigned)
PROVIDER NOTE: Information contained herein reflects review and annotations entered in association with encounter. Interpretation of such information and data should be left to medically-trained personnel. Information provided to patient can be located elsewhere in the medical record under "Patient Instructions". Document created using STT-dictation technology, any transcriptional errors that may result from process are unintentional.    Patient: Caleb [name]  Service Category: E/M  Provider: Bruce Churilla A Wylan Gentzler, MD  DOB: 09/30/1989  DOS: 07/01/2022  Referring Provider: No ref. provider found  MRN: [id number]  Specialty: Interventional Pain Management  PCP: Patient, No Pcp Per  Type: Established Patient  Setting: Ambulatory outpatient    Location: Office  Delivery: Face-to-face     HPI  [name], a 67 y.o. year old male, is here today because of his No primary diagnosis found.. [name]'s primary complain today is No chief complaint on file.  Pertinent problems: [name] has Pain in soft tissues of limb; Chronic hip pain (Right); Chronic shoulder pain (3ry area of Pain) (Right); Other spondylosis with radiculopathy, lumbar region; Chronic low back pain (1ry area of Pain) (Bilateral) (R>L) w/o sciatica; Chronic pain syndrome; Closed fracture of radius, distal end; Laceration of unspecified muscle, fascia and tendon at wrist and hand level, left hand, sequela; Knee pain; Chronic mid back pain (2ry area of Pain) (Bilateral) (R>L); Chronic neck pain (4th area of Pain) (Bilateral); Chronic feet pain (5th area of Pain) (Bilateral); Chronic sacroiliac pain (Bilateral); Schmorl's nodes of lumbar region (L1); Cervicalgia; DDD (degenerative disc disease), cervical; DDD (degenerative disc disease), thoracic; Chronic hand pain (6th area of Pain) (Bilateral); Lumbar facet arthropathy; Lumbar facet joint pain; DDD (degenerative disc disease), lumbar; and Abnormal MRI, lumbar spine (12/2020) on their  pertinent problem list. Pain Assessment: Severity of   is reported as a  /10. Location:    / . Onset:  . Quality:  . Timing:  . Modifying factor(s):  . Vitals:  vitals were not taken for this visit.  BMI: Estimated body mass index is 29.12 kg/m as calculated from the following:   Height as of 06/15/22: 5' 5" (1.651 m).   Weight as of 06/15/22: 175 lb (79.4 kg). Last encounter: 06/08/2022. Last procedure: 06/15/2022.  Reason for encounter: post-procedure evaluation and assessment. ***  Post-procedure evaluation   Procedure: Lumbar epidural steroid injection (LESI) (interlaminar) #2    Laterality: Right   Level:  L1-2 Level.  Imaging: Fluoroscopic guidance         Anesthesia: Local anesthesia (1-2% Lidocaine) Anxiolysis: IV Versed 2.0 mg Sedation: Moderate Sedation Fentanyl 1 mL (50 mcg) DOS: 06/15/2022  Performed by: Gianny Killman A Nysha Koplin, MD  Purpose: Diagnostic/Therapeutic Indications: Lumbar radicular pain of intraspinal etiology of more than 4 weeks that has failed to respond to conservative therapy and is severe enough to impact quality of life or function. 1. Chronic low back pain (1ry area of Pain) (Bilateral) (R>L) w/o sciatica   2. Chronic mid back pain (2ry area of Pain) (Bilateral) (R>L)   3. DDD (degenerative disc disease), lumbar   4. Abnormal MRI, lumbar spine (12/2020)    NAS-11 Pain score:   Pre-procedure: 2 /10   Post-procedure: 0-No pain/10      Effectiveness:  Initial hour after procedure:   ***. Subsequent 4-6 hours post-procedure:   ***. Analgesia past initial 6 hours:   ***. Ongoing improvement:  Analgesic:  *** Function:    ***    ROM:    ***     Pharmacotherapy Assessment  Analgesic:   No chronic opioid analgesics therapy prescribed by our practice. None MME/day: 0 mg/day   Monitoring: [place] PMP: PDMP reviewed during this encounter.       Pharmacotherapy: No side-effects or adverse reactions reported. Compliance: No problems identified. Effectiveness:  Clinically acceptable.  No notes on file  No results found for: "CBDTHCR" No results found for: "D8THCCBX" No results found for: "D9THCCBX"  UDS:  Summary  Date Value Ref Range Status  04/12/2022 Note  Final    Comment:    ==================================================================== Compliance Drug Analysis, Ur ==================================================================== Test                             Result       Flag       Units  Drug Present not Declared for Prescription Verification   Alcohol, Ethyl                 0.045        UNEXPECTED g/dL    Sources of ethyl alcohol include alcoholic beverages or as a    fermentation product of glucose; glucose is present in this specimen.    Interpret result with caution, as the presence of ethyl alcohol is    likely due, at least in part, to fermentation of glucose.    Carboxy-THC                    16           UNEXPECTED ng/mg creat    Carboxy-THC is a metabolite of tetrahydrocannabinol (THC). Source of    THC is most commonly herbal marijuana or marijuana-based products,    but THC is also present in a scheduled prescription medication.    Trace amounts of THC can be present in hemp and cannabidiol (CBD)    products. This test is not intended to distinguish between delta-9-    tetrahydrocannabinol, the predominant form of THC in most herbal or    marijuana-based products, and delta-8-tetrahydrocannabinol.  Drug Absent but Declared for Prescription Verification   Hydrocodone                    Not Detected UNEXPECTED ng/mg creat   Acetaminophen                  Not Detected UNEXPECTED    Acetaminophen, as indicated in the declared medication list, is not    always detected even when used as directed.    Ibuprofen                      Not Detected UNEXPECTED    Ibuprofen, as indicated in the declared medication list, is not    always detected even when used as  directed.  ==================================================================== Test                      Result    Flag   Units      Ref Range   Creatinine              219              mg/dL      >=20 ==================================================================== Declared Medications:  The flagging and interpretation on this report are based on the  following declared medications.  Unexpected results may arise from  inaccuracies in the declared medications.   **Note: The testing scope of this panel includes   these medications:   Hydrocodone (Norco)   **Note: The testing scope of this panel does not include small to  moderate amounts of these reported medications:   Acetaminophen (Tylenol)  Acetaminophen (Norco)  Ibuprofen (Advil)   **Note: The testing scope of this panel does not include the  following reported medications:   Atorvastatin (Lipitor)  Celecoxib (Celebrex)  Melatonin ==================================================================== For clinical consultation, please call [phone]. ====================================================================       ROS  Constitutional: Denies any fever or chills Gastrointestinal: No reported hemesis, hematochezia, vomiting, or acute GI distress Musculoskeletal: Denies any acute onset joint swelling, redness, loss of ROM, or weakness Neurological: No reported episodes of acute onset apraxia, aphasia, dysarthria, agnosia, amnesia, paralysis, loss of coordination, or loss of consciousness  Medication Review  Vitamin D3, acetaminophen, atorvastatin, and ibuprofen  History Review  Allergy: [name] is allergic to acamprosate and naltrexone. Drug: [name]  reports that he does not currently use drugs after having used the following drugs: Marijuana. Alcohol:  reports that he does not currently use alcohol. Tobacco:  reports that he has been smoking cigarettes. He has been smoking an average of .5  packs per day. His smokeless tobacco use includes chew. Social: [name]  reports that he has been smoking cigarettes. He has been smoking an average of .5 packs per day. His smokeless tobacco use includes chew. He reports that he does not currently use alcohol. He reports that he does not currently use drugs after having used the following drugs: Marijuana. Medical:  has a past medical history of Depression and Pilonidal cyst (09/2020). Surgical: [name]  has a past surgical history that includes No past surgeries and Pilonidal cyst excision (N/A, 10/08/2020). Family: family history is not on file.  Laboratory Chemistry Profile   Renal Lab Results  Component Value Date   BUN 13 04/12/2022   CREATININE 0.80 04/12/2022   BCR 16 04/12/2022    Hepatic Lab Results  Component Value Date   AST 33 04/12/2022   ALBUMIN 4.7 04/12/2022   ALKPHOS 105 04/12/2022    Electrolytes Lab Results  Component Value Date   NA 143 04/12/2022   K 4.5 04/12/2022   CL 104 04/12/2022   CALCIUM 9.1 04/12/2022   MG 2.0 04/12/2022    Bone Lab Results  Component Value Date   25OHVITD1 20 (L) 04/12/2022   25OHVITD2 <1.0 04/12/2022   25OHVITD3 19 04/12/2022    Inflammation (CRP: Acute Phase) (ESR: Chronic Phase) Lab Results  Component Value Date   CRP <1 04/12/2022   ESRSEDRATE 8 04/12/2022         Note: Above Lab results reviewed.  Recent Imaging Review  DG PAIN CLINIC C-ARM 1-60 MIN NO REPORT Fluoro was used, but no Radiologist interpretation will be provided.  Please refer to "NOTES" tab for provider progress note. Note: Reviewed        Physical Exam  General appearance: Well nourished, well developed, and well hydrated. In no apparent acute distress Mental status: Alert, oriented x 3 (person, place, & time)       Respiratory: No evidence of acute respiratory distress Eyes: PERLA Vitals: There were no vitals taken for this visit. BMI: Estimated body mass index is 29.12 kg/m as  calculated from the following:   Height as of 06/15/22: 5' 5" (1.651 m).   Weight as of 06/15/22: 175 lb (79.4 kg). Ideal: Ideal body weight: 61.5 kg (135 lb 9.3 oz) Adjusted ideal body weight: 68.7 kg (151 lb 5.6   oz)  Assessment   Diagnosis Status  No diagnosis found. Controlled Controlled Controlled   Updated Problems: No problems updated.  Plan of Care  Problem-specific:  No problem-specific Assessment & Plan notes found for this encounter.  [name] has a current medication list which includes the following long-term medication(s): atorvastatin.  Pharmacotherapy (Medications Ordered): No orders of the defined types were placed in this encounter.  Orders:  No orders of the defined types were placed in this encounter.  Follow-up plan:   No follow-ups on file.      Interventional Therapies  Risk Factors  Considerations:  (+) SUD  Abnormal UDS (04/12/2022) (+) ETOH, carboxy-THC  alcohol dependence  nicotine dependence  Hx. THC use  ADDHD  Hx. Suicide attempt  OSA  Depression  GAD     Planned  Pending:   Therapeutic right L1-2 LESI #2 (06/15/2022)    Under consideration:   Diagnostic/therapeutic bilateral lumbar facet MBB #1    Completed:   Diagnostic/therapeutic right L1-2 LESI x1 (05/11/2022) (100/100/25/25)    Completed by other providers:   Diagnostic/therapeutic bilateral L5 TFESI x1 (02/20/2021) by Jennifer Morales, MD ([facility]) ([place]) (no benefit)    Therapeutic  Palliative (PRN) options:   None established        Recent Visits Date Type Provider Dept  06/15/22 Procedure visit Dewana Ammirati, MD [facility]  06/08/22 Office Visit Tamim Skog, MD [facility]  05/11/22 Procedure visit Shiryl Ruddy, MD [facility]  04/26/22 Office Visit Sadeen Wiegel, MD [facility]  04/12/22 Office Visit Novah Nessel, MD [facility]  Showing recent visits within past 90  days and meeting all other requirements Future Appointments Date Type Provider Dept  07/01/22 Appointment Macon Sandiford, MD [facility]  Showing future appointments within next 90 days and meeting all other requirements  I discussed the assessment and treatment plan with the patient. The patient was provided an opportunity to ask questions and all were answered. The patient agreed with the plan and demonstrated an understanding of the instructions.  Patient advised to call back or seek an in-person evaluation if the symptoms or condition worsens.  Duration of encounter: *** minutes.  Total time on encounter, as per AMA guidelines included both the face-to-face and non-face-to-face time personally spent by the physician and/or other qualified health care professional(s) on the day of the encounter (includes time in activities that require the physician or other qualified health care professional and does not include time in activities normally performed by clinical staff). Physician's time may include the following activities when performed: Preparing to see the patient (e.g., pre-charting review of records, searching for previously ordered imaging, lab work, and nerve conduction tests) Review of prior analgesic pharmacotherapies. Reviewing PMP Interpreting ordered tests (e.g., lab work, imaging, nerve conduction tests) Performing post-procedure evaluations, including interpretation of diagnostic procedures Obtaining and/or reviewing separately obtained history Performing a medically appropriate examination and/or evaluation Counseling and educating the patient/family/caregiver Ordering medications, tests, or procedures Referring and communicating with other health care professionals (when not separately reported) Documenting clinical information in the electronic or other health record Independently interpreting results (not separately reported) and communicating results to the  patient/ family/caregiver Care coordination (not separately reported)  Note by: Aeric Burnham A Tyon Cerasoli, MD Date: 07/01/2022; Time: 6:17 AM   10/25/2017) (4-0) (100/100/100 x1 day/25) Therapeutic right deep peroneal NB (D-PNB) x1 (05/03/2019)    Therapeutic  Palliative (PRN) options:   Palliative left CESI  Diagnostic bilateral cervical facet block #3  Palliative right superficial peroneal NB #5  Palliative right superficial peroneal nerve RFA #2    Pharmacotherapy  Nonopioids transfer 11/21/2019: Lyrica and Robaxin (07/27/2021) Abnormal UDS (+) Benzoylecgonine, a COCAINE metabolite        Recent Visits Date Type Provider Dept  04/29/22 Procedure visit Delano Metz, MD [facility]  04/07/22 Office Visit Delano Metz, MD [facility]  Showing recent visits within past 90 days and meeting all other requirements Future Appointments Date Type Provider Dept  07/01/22 Appointment Delano Metz, MD [facility]  Showing future appointments within next 90 days and meeting all other requirements  I discussed the assessment and treatment plan with the patient. The patient was provided an opportunity to ask questions and all were answered. The patient agreed with the plan and demonstrated an understanding of the instructions.  Patient advised to call back or seek an in-person evaluation if the symptoms or condition worsens.  Duration of encounter: *** minutes.  Total time on encounter, as per AMA guidelines included both the face-to-face and non-face-to-face time personally spent by the physician and/or other qualified health care professional(s) on the day of the encounter (includes time in activities that require the physician or other qualified health care professional and does not include time in activities normally performed by clinical staff). Physician's time may include the following activities when performed: Preparing to see the patient (e.g., pre-charting review of records, searching for previously ordered imaging, lab work, and nerve conduction tests) Review of prior analgesic pharmacotherapies. Reviewing PMP Interpreting ordered tests (e.g., lab work, imaging, nerve conduction tests) Performing post-procedure evaluations, including interpretation of diagnostic procedures Obtaining and/or reviewing separately obtained history Performing a medically appropriate examination and/or evaluation Counseling and educating the patient/family/caregiver Ordering medications, tests, or procedures Referring and communicating with other health care professionals (when not separately reported) Documenting clinical information in the electronic or other health record Independently interpreting results (not separately reported) and communicating results to the patient/ family/caregiver Care coordination (not separately reported)  Note by: Oswaldo Done, MD Date: 07/01/2022; Time: 6:15 AM

## 2022-06-29 ENCOUNTER — Other Ambulatory Visit: Payer: Self-pay | Admitting: Adult Health

## 2022-06-29 ENCOUNTER — Other Ambulatory Visit: Payer: Self-pay | Admitting: Family Medicine

## 2022-06-29 ENCOUNTER — Other Ambulatory Visit: Payer: Self-pay | Admitting: Cardiovascular Disease

## 2022-06-29 DIAGNOSIS — G8929 Other chronic pain: Secondary | ICD-10-CM

## 2022-06-29 NOTE — Telephone Encounter (Signed)
Requested medication (s) are due for refill today no  Requested medication (s) are on the active medication list -yes  Future visit scheduled -yes  Last refill: 06/07/22 #90  Notes to clinic: non delegated Rx  Requested Prescriptions  Pending Prescriptions Disp Refills   methocarbamol (ROBAXIN) 750 MG tablet [Pharmacy Med Name: METHOCARBAMOL 750 MG TABLET] 90 tablet 0    Sig: TAKE 1 TABLET (750 MG TOTAL) BY MOUTH EVERY 8 (EIGHT) HOURS AS NEEDED FOR MUSCLE SPASMS.     Not Delegated - Analgesics:  Muscle Relaxants Failed - 06/29/2022 11:03 AM      Failed - This refill cannot be delegated      Passed - Valid encounter within last 6 months    Recent Outpatient Visits           1 month ago Chest pain, unspecified type   [facility] [place], Marzella Schlein, MD   2 months ago Encounter for annual physical exam   [place] [facility] [place], Marzella Schlein, MD   3 months ago Cough syncope   [facility] [place], Marzella Schlein, MD   5 months ago Chronic foot pain (1ry area of Pain) (Right)   [facility], Marzella Schlein, MD   8 months ago Prediabetes   [place] [facility] [place], Marzella Schlein, MD       Future Appointments             In 1 week Bacigalupo, Marzella Schlein, MD [facility]   In 1 month Brion Aliment, Dois Davenport, NP [facility] [place]   In 4 months Sondra Come, MD [facility] [place]   In 4 months Willeen Niece, MD [facility]               Requested Prescriptions  Pending Prescriptions Disp Refills   methocarbamol (ROBAXIN) 750 MG tablet [Pharmacy Med Name: METHOCARBAMOL 750 MG TABLET] 90 tablet 0    Sig: TAKE 1 TABLET (750 MG TOTAL) BY MOUTH EVERY 8 (EIGHT) HOURS AS NEEDED FOR MUSCLE SPASMS.     Not Delegated - Analgesics:  Muscle Relaxants Failed - 06/29/2022 11:03 AM       Failed - This refill cannot be delegated      Passed - Valid encounter within last 6 months    Recent Outpatient Visits           1 month ago Chest pain, unspecified type   [facility] [place], Marzella Schlein, MD   2 months ago Encounter for annual physical exam   [place], Marzella Schlein, MD   3 months ago Cough syncope   [place] [facility] [place], Marzella Schlein, MD   5 months ago Chronic foot pain (1ry area of Pain) (Right)   [place] [facility] [place], Marzella Schlein, MD   8 months ago Prediabetes   [place] [facility] [employer], Marzella Schlein, MD       Future Appointments             In 1 week Bacigalupo, Marzella Schlein, MD [facility]   In 1 month Brion Aliment, Dois Davenport, NP [facility] [place]   In 4 months Richardo Hanks, Laurette Schimke, MD [facility] [place]   In 4 months Willeen Niece, MD [facility]

## 2022-07-01 ENCOUNTER — Ambulatory Visit (HOSPITAL_BASED_OUTPATIENT_CLINIC_OR_DEPARTMENT_OTHER): Payer: Medicaid Other | Admitting: Pain Medicine

## 2022-07-01 DIAGNOSIS — Z09 Encounter for follow-up examination after completed treatment for conditions other than malignant neoplasm: Secondary | ICD-10-CM

## 2022-07-01 DIAGNOSIS — Z91199 Patient's noncompliance with other medical treatment and regimen due to unspecified reason: Secondary | ICD-10-CM

## 2022-07-06 ENCOUNTER — Ambulatory Visit: Payer: Medicaid Other | Admitting: Family Medicine

## 2022-07-15 ENCOUNTER — Other Ambulatory Visit: Payer: Self-pay | Admitting: Orthopedic Surgery

## 2022-07-15 DIAGNOSIS — M25561 Pain in right knee: Secondary | ICD-10-CM

## 2022-07-16 ENCOUNTER — Telehealth: Payer: Self-pay | Admitting: Family Medicine

## 2022-07-16 ENCOUNTER — Other Ambulatory Visit: Payer: Self-pay | Admitting: Family Medicine

## 2022-07-16 DIAGNOSIS — G894 Chronic pain syndrome: Secondary | ICD-10-CM

## 2022-07-16 DIAGNOSIS — S8411XS Injury of peroneal nerve at lower leg level, right leg, sequela: Secondary | ICD-10-CM

## 2022-07-16 DIAGNOSIS — M792 Neuralgia and neuritis, unspecified: Secondary | ICD-10-CM

## 2022-07-16 NOTE — Telephone Encounter (Signed)
Pharmacy requested in a separate refill encounter today, will route to provider in that encounter for approval.

## 2022-07-16 NOTE — Telephone Encounter (Signed)
Medication Refill - Medication:  pregabalin (LYRICA) 200 MG capsule   Has the patient contacted their pharmacy? Yes.   Pharmacy advised the pt to call the office and ask for a refill request.   Preferred Pharmacy (with phone number or street name): [id number] [place], [place] - [address] AT [facility] [address]  Phone: [phone] Fax: [phone]  Has the patient been seen for an appointment in the last year OR does the patient have an upcoming appointment? Yes.    Agent: Please be advised that RX refills may take up to 3 business days. We ask that you follow-up with your pharmacy.

## 2022-07-16 NOTE — Telephone Encounter (Signed)
Requested medications are due for refill today.  yes  Requested medications are on the active medications list.  yes  Last refill. 04/05/2022 #90 2 rf  Future visit scheduled.   no  Notes to clinic.  Refill not delegated.    Requested Prescriptions  Pending Prescriptions Disp Refills   pregabalin (LYRICA) 200 MG capsule [Pharmacy Med Name: PREGABALIN 200MG  CAPSULES] 90 capsule     Sig: TAKE 1 CAPSULE BY MOUTH EVERY MORNING, AT NOON, AND AT BEDTIME     Not Delegated - Neurology:  Anticonvulsants - Controlled - pregabalin Failed - 07/16/2022  2:33 PM      Failed - This refill cannot be delegated      Passed - Cr in normal range and within 360 days    Creat  Date Value Ref Range Status  10/21/2016 0.98 0.70 - 1.33 mg/dL Final    Comment:    For patients >64 years of age, the reference limit for Creatinine is approximately 13% higher for people identified as African-American. .    Creatinine, Ser  Date Value Ref Range Status  04/26/2022 0.75 0.61 - 1.24 mg/dL Final         Passed - Completed PHQ-2 or PHQ-9 in the last 360 days      Passed - Valid encounter within last 12 months    Recent Outpatient Visits           1 month ago Chest pain, unspecified type   [facility] [place], Marzella Schlein, MD   3 months ago Encounter for annual physical exam   [place] [facility] [place], Marzella Schlein, MD   4 months ago Cough syncope   [place] [facility] [place], Marzella Schlein, MD   5 months ago Chronic foot pain (1ry area of Pain) (Right)   [place] [facility], Marzella Schlein, MD   9 months ago Prediabetes   [facility] [place], Marzella Schlein, MD       Future Appointments             In 1 month Brion Aliment, Dois Davenport, NP [facility] [place] [facility] [place]   In 3 months Richardo Hanks, Laurette Schimke, MD [facility] [place]   In 4 months Willeen Niece, MD [facility]

## 2022-07-29 ENCOUNTER — Other Ambulatory Visit: Payer: Self-pay

## 2022-07-29 DIAGNOSIS — L409 Psoriasis, unspecified: Secondary | ICD-10-CM

## 2022-07-29 MED ORDER — COSENTYX SENSOREADY (300 MG) 150 MG/ML ~~LOC~~ SOAJ
SUBCUTANEOUS | 3 refills | Status: DC
Start: 2022-07-29 — End: 2022-10-14

## 2022-07-29 NOTE — Progress Notes (Signed)
Refill request sent from [facility]. Escripted

## 2022-07-31 ENCOUNTER — Ambulatory Visit
Admission: RE | Admit: 2022-07-31 | Discharge: 2022-07-31 | Disposition: A | Discharge status: Home / Self Care | Payer: Medicaid Other | Source: Ambulatory Visit | Attending: Orthopedic Surgery | Admitting: Orthopedic Surgery

## 2022-07-31 DIAGNOSIS — M25561 Pain in right knee: Secondary | ICD-10-CM

## 2022-08-04 ENCOUNTER — Other Ambulatory Visit: Payer: Self-pay | Admitting: Family Medicine

## 2022-08-04 ENCOUNTER — Other Ambulatory Visit: Payer: Self-pay | Admitting: Cardiovascular Disease

## 2022-08-04 DIAGNOSIS — G8929 Other chronic pain: Secondary | ICD-10-CM

## 2022-08-05 ENCOUNTER — Telehealth: Payer: Self-pay | Admitting: Adult Health

## 2022-08-05 NOTE — Telephone Encounter (Signed)
Requested medication (s) are due for refill today: routing for review  Requested medication (s) are on the active medication list: yes  Last refill:  06/30/22  Future visit scheduled: no  Notes to clinic:  Unable to refill per protocol, cannot delegate.      Requested Prescriptions  Pending Prescriptions Disp Refills   methocarbamol (ROBAXIN) 750 MG tablet [Pharmacy Med Name: METHOCARBAMOL 750 MG TABLET] 90 tablet 0    Sig: TAKE 1 TABLET (750 MG TOTAL) BY MOUTH EVERY 8 (EIGHT) HOURS AS NEEDED FOR MUSCLE SPASMS.     Not Delegated - Analgesics:  Muscle Relaxants Failed - 08/04/2022  8:58 AM      Failed - This refill cannot be delegated      Passed - Valid encounter within last 6 months    Recent Outpatient Visits           2 months ago Chest pain, unspecified type   [facility] [place], Marzella Schlein, MD   4 months ago Encounter for annual physical exam   [facility] [place], Marzella Schlein, MD   4 months ago Cough syncope   [place] [facility] [place], Marzella Schlein, MD   6 months ago Chronic foot pain (1ry area of Pain) (Right)   [facility] [place], Marzella Schlein, MD   10 months ago Prediabetes   [facility] [place], Marzella Schlein, MD       Future Appointments             In 2 weeks Brion Aliment, Dois Davenport, NP [place] [facility] [place]   In 2 months Sondra Come, MD [facility] [place]   In 3 months Willeen Niece, MD [facility] [place] [facility]            Signed Prescriptions Disp Refills   rosuvastatin (CRESTOR) 40 MG tablet 90 tablet 0    Sig: TAKE 1 TABLET BY MOUTH EVERY DAY     Cardiovascular:  Antilipid - Statins 2 Failed - 08/04/2022  8:58 AM      Failed - Lipid Panel in normal range within the last 12 months    Cholesterol, Total  Date Value Ref Range Status  04/05/2022 136 100 - 199 mg/dL Final   LDL  Cholesterol (Calc)  Date Value Ref Range Status  10/21/2016 68 mg/dL (calc) Final    Comment:    Reference range: <100 . Desirable range <100 mg/dL for primary prevention;   <70 mg/dL for patients with CHD or diabetic patients  with > or = 2 CHD risk factors. Marland Kitchen LDL-C is now calculated using the Martin-Hopkins  calculation, which is a validated novel method providing  better accuracy than the Friedewald equation in the  estimation of LDL-C.  Horald Pollen et al. Lenox Ahr. 2440;102(72): 2061-2068  (http://education.[employer].com/faq/FAQ164)    LDL Chol Calc (NIH)  Date Value Ref Range Status  04/05/2022 51 0 - 99 mg/dL Final   HDL  Date Value Ref Range Status  04/05/2022 48 >39 mg/dL Final   Triglycerides  Date Value Ref Range Status  04/05/2022 236 (H) 0 - 149 mg/dL Final         Passed - Cr in normal range and within 360 days    Creat  Date Value Ref Range Status  10/21/2016 0.98 0.70 - 1.33 mg/dL Final    Comment:    For patients >10 years of age, the reference limit for Creatinine  is approximately 13% higher for people identified as African-American. .    Creatinine, Ser  Date Value Ref Range Status  04/26/2022 0.75 0.61 - 1.24 mg/dL Final         Passed - Patient is not pregnant      Passed - Valid encounter within last 12 months    Recent Outpatient Visits           2 months ago Chest pain, unspecified type   [facility] [place], Marzella Schlein, MD   4 months ago Encounter for annual physical exam   [place] [place], Marzella Schlein, MD   4 months ago Cough syncope   [facility] [place], Marzella Schlein, MD   6 months ago Chronic foot pain (1ry area of Pain) (Right)   [place], Marzella Schlein, MD   10 months ago Prediabetes   [facility] [place], Marzella Schlein, MD       Future Appointments             In 2 weeks  Brion Aliment, Dois Davenport, NP [place] [facility] [place]   In 2 months Sondra Come, MD [facility] [place]   In 3 months Willeen Niece, MD [facility] [place] [facility]            Refused Prescriptions Disp Refills   losartan (COZAAR) 100 MG tablet [Pharmacy Med Name: LOSARTAN POTASSIUM 100 MG TAB] 90 tablet 0    Sig: TAKE 1 TABLET BY MOUTH EVERY DAY     Cardiovascular:  Angiotensin Receptor Blockers Passed - 08/04/2022  8:58 AM      Passed - Cr in normal range and within 180 days    Creat  Date Value Ref Range Status  10/21/2016 0.98 0.70 - 1.33 mg/dL Final    Comment:    For patients >42 years of age, the reference limit for Creatinine is approximately 13% higher for people identified as African-American. .    Creatinine, Ser  Date Value Ref Range Status  04/26/2022 0.75 0.61 - 1.24 mg/dL Final         Passed - K in normal range and within 180 days    Potassium  Date Value Ref Range Status  04/26/2022 3.5 3.5 - 5.1 mmol/L Final         Passed - Patient is not pregnant      Passed - Last BP in normal range    BP Readings from Last 1 Encounters:  06/10/22 118/70         Passed - Valid encounter within last 6 months    Recent Outpatient Visits           2 months ago Chest pain, unspecified type   [facility] [place], Marzella Schlein, MD   4 months ago Encounter for annual physical exam   [place] [place], Marzella Schlein, MD   4 months ago Cough syncope   [place] [facility] [place], Marzella Schlein, MD   6 months ago Chronic foot pain (1ry area of Pain) (Right)   [facility], Marzella Schlein, MD   10 months ago Prediabetes   [facility] [place], Marzella Schlein, MD       Future Appointments             In 2 weeks Creig Hines, NP [facility] [place]   In 2 months Richardo Hanks, Laurette Schimke,  MD [facility] [place]   In 3 months Willeen Niece, MD [facility] [place] [facility]

## 2022-08-05 NOTE — Telephone Encounter (Signed)
Requested Prescriptions  Pending Prescriptions Disp Refills   methocarbamol (ROBAXIN) 750 MG tablet [Pharmacy Med Name: METHOCARBAMOL 750 MG TABLET] 90 tablet 0    Sig: TAKE 1 TABLET (750 MG TOTAL) BY MOUTH EVERY 8 (EIGHT) HOURS AS NEEDED FOR MUSCLE SPASMS.     Not Delegated - Analgesics:  Muscle Relaxants Failed - 08/04/2022  8:58 AM      Failed - This refill cannot be delegated      Passed - Valid encounter within last 6 months    Recent Outpatient Visits           2 months ago Chest pain, unspecified type   [facility] [place], Marzella Schlein, MD   4 months ago Encounter for annual physical exam   [place] [facility] [place], Marzella Schlein, MD   4 months ago Cough syncope   [facility] [place], Marzella Schlein, MD   6 months ago Chronic foot pain (1ry area of Pain) (Right)   [place] [facility] [place], Marzella Schlein, MD   10 months ago Prediabetes   [place] [facility] [place], Marzella Schlein, MD       Future Appointments             In 2 weeks Brion Aliment, Dois Davenport, NP [place] [facility] [place]   In 2 months Sondra Come, MD [facility] [place]   In 3 months Willeen Niece, MD [place] [facility]             losartan (COZAAR) 100 MG tablet [Pharmacy Med Name: LOSARTAN POTASSIUM 100 MG TAB] 90 tablet 0    Sig: TAKE 1 TABLET BY MOUTH EVERY DAY     Cardiovascular:  Angiotensin Receptor Blockers Passed - 08/04/2022  8:58 AM      Passed - Cr in normal range and within 180 days    Creat  Date Value Ref Range Status  10/21/2016 0.98 0.70 - 1.33 mg/dL Final    Comment:    For patients >68 years of age, the reference limit for Creatinine is approximately 13% higher for people identified as African-American. .    Creatinine, Ser  Date Value Ref Range Status  04/26/2022 0.75 0.61 - 1.24 mg/dL Final         Passed - K in normal  range and within 180 days    Potassium  Date Value Ref Range Status  04/26/2022 3.5 3.5 - 5.1 mmol/L Final         Passed - Patient is not pregnant      Passed - Last BP in normal range    BP Readings from Last 1 Encounters:  06/10/22 118/70         Passed - Valid encounter within last 6 months    Recent Outpatient Visits           2 months ago Chest pain, unspecified type   [facility] [place], Marzella Schlein, MD   4 months ago Encounter for annual physical exam   [place] [place], Marzella Schlein, MD   4 months ago Cough syncope   [place] [facility] [place], Marzella Schlein, MD   6 months ago Chronic foot pain (1ry area of Pain) (Right)    [facility], Marzella Schlein, MD   10 months ago Prediabetes   [facility] [place], Marzella Schlein, MD  Future Appointments             In 2 weeks Brion Aliment, Dois Davenport, NP [place] [facility] [place]   In 2 months Sondra Come, MD [facility] [place]   In 3 months Willeen Niece, MD [place] [facility]             rosuvastatin (CRESTOR) 40 MG tablet [Pharmacy Med Name: ROSUVASTATIN CALCIUM 40 MG TAB] 90 tablet 0    Sig: TAKE 1 TABLET BY MOUTH EVERY DAY     Cardiovascular:  Antilipid - Statins 2 Failed - 08/04/2022  8:58 AM      Failed - Lipid Panel in normal range within the last 12 months    Cholesterol, Total  Date Value Ref Range Status  04/05/2022 136 100 - 199 mg/dL Final   LDL Cholesterol (Calc)  Date Value Ref Range Status  10/21/2016 68 mg/dL (calc) Final    Comment:    Reference range: <100 . Desirable range <100 mg/dL for primary prevention;   <70 mg/dL for patients with CHD or diabetic patients  with > or = 2 CHD risk factors. Marland Kitchen LDL-C is now calculated using the Martin-Hopkins  calculation, which is a validated novel method providing  better  accuracy than the Friedewald equation in the  estimation of LDL-C.  Horald Pollen et al. Lenox Ahr. 1914;782(95): 2061-2068  (http://education.[facility].com/faq/FAQ164)    LDL Chol Calc (NIH)  Date Value Ref Range Status  04/05/2022 51 0 - 99 mg/dL Final   HDL  Date Value Ref Range Status  04/05/2022 48 >39 mg/dL Final   Triglycerides  Date Value Ref Range Status  04/05/2022 236 (H) 0 - 149 mg/dL Final         Passed - Cr in normal range and within 360 days    Creat  Date Value Ref Range Status  10/21/2016 0.98 0.70 - 1.33 mg/dL Final    Comment:    For patients >63 years of age, the reference limit for Creatinine is approximately 13% higher for people identified as African-American. .    Creatinine, Ser  Date Value Ref Range Status  04/26/2022 0.75 0.61 - 1.24 mg/dL Final         Passed - Patient is not pregnant      Passed - Valid encounter within last 12 months    Recent Outpatient Visits           2 months ago Chest pain, unspecified type   [facility] [place], Marzella Schlein, MD   4 months ago Encounter for annual physical exam   [facility] [place], Marzella Schlein, MD   4 months ago Cough syncope   [facility] [place], Marzella Schlein, MD   6 months ago Chronic foot pain (1ry area of Pain) (Right)   [place] [facility], Marzella Schlein, MD   10 months ago Prediabetes   [facility] [place], Marzella Schlein, MD       Future Appointments             In 2 weeks Brion Aliment, Dois Davenport, NP [place] [facility] [place]   In 2 months Sondra Come, MD [facility]    In 3 months Willeen Niece, MD [facility] [place] [facility]

## 2022-08-05 NOTE — Telephone Encounter (Signed)
I spoke with [name] from [employer] she stated the patient called into [facility] on 06/10/22 and was transferred to his branch but there was no further documentation. Caleb [name] is going to call his branch to see if they remember what took place since they didn't document anything. She will then call me back

## 2022-08-05 NOTE — Telephone Encounter (Signed)
Caleb [name] can you please check on this? Thank you!

## 2022-08-06 NOTE — Telephone Encounter (Signed)
I spoke with the patient. He said [employer] reached out to him yesterday regarding the mask. He made some adjustments and will see if it helps.  Nothing further needed.

## 2022-08-16 ENCOUNTER — Other Ambulatory Visit: Payer: Self-pay | Admitting: Adult Health

## 2022-08-16 ENCOUNTER — Other Ambulatory Visit: Payer: Self-pay | Admitting: Family Medicine

## 2022-08-16 DIAGNOSIS — S8411XS Injury of peroneal nerve at lower leg level, right leg, sequela: Secondary | ICD-10-CM

## 2022-08-16 DIAGNOSIS — G894 Chronic pain syndrome: Secondary | ICD-10-CM

## 2022-08-16 DIAGNOSIS — M792 Neuralgia and neuritis, unspecified: Secondary | ICD-10-CM

## 2022-08-17 ENCOUNTER — Ambulatory Visit: Payer: Medicaid Other

## 2022-08-24 ENCOUNTER — Telehealth: Payer: Self-pay | Admitting: Urology

## 2022-08-24 ENCOUNTER — Encounter: Payer: Self-pay | Admitting: Nurse Practitioner

## 2022-08-24 ENCOUNTER — Ambulatory Visit: Payer: Medicaid Other | Attending: Nurse Practitioner | Admitting: Nurse Practitioner

## 2022-08-24 VITALS — BP 106/62 | HR 81 | Ht 68.0 in | Wt 202.5 lb

## 2022-08-24 DIAGNOSIS — I2510 Atherosclerotic heart disease of native coronary artery without angina pectoris: Secondary | ICD-10-CM

## 2022-08-24 DIAGNOSIS — Z720 Tobacco use: Secondary | ICD-10-CM | POA: Diagnosis not present

## 2022-08-24 DIAGNOSIS — E785 Hyperlipidemia, unspecified: Secondary | ICD-10-CM

## 2022-08-24 DIAGNOSIS — I10 Essential (primary) hypertension: Secondary | ICD-10-CM | POA: Diagnosis not present

## 2022-08-24 DIAGNOSIS — I5022 Chronic systolic (congestive) heart failure: Secondary | ICD-10-CM

## 2022-08-24 DIAGNOSIS — I255 Ischemic cardiomyopathy: Secondary | ICD-10-CM | POA: Diagnosis not present

## 2022-08-24 NOTE — Telephone Encounter (Signed)
Patient dropped in office today to request refill of Sildenafil 100 mg. Pharmacy is [facility].

## 2022-08-24 NOTE — Telephone Encounter (Signed)
Sildenafil was removed from his medication list by cardiology, please advise if ok to refill based on recent cardiology visit.

## 2022-08-24 NOTE — Telephone Encounter (Signed)
Called pt no answer. Left detailed message for pt informing him that we are unable to fill sildenafil due to cardiology medications.

## 2022-08-24 NOTE — Patient Instructions (Signed)
Medication Instructions:  No changes *If you need a refill on your cardiac medications before your next appointment, please call your pharmacy*   Lab Work: None ordered If you have labs (blood work) drawn today and your tests are completely normal, you will receive your results only by: MyChart Message (if you have MyChart) OR A paper copy in the mail If you have any lab test that is abnormal or we need to change your treatment, we will call you to review the results.   Testing/Procedures: None ordered   Follow-Up: At [facility], you and your health needs are our priority.  As part of our continuing mission to provide you with exceptional heart care, we have created designated Provider Care Teams.  These Care Teams include your primary Cardiologist (physician) and Advanced Practice Providers (APPs -  Physician Assistants and Nurse Practitioners) who all work together to provide you with the care you need, when you need it.  We recommend signing up for the patient portal called "MyChart".  Sign up information is provided on this After Visit Summary.  MyChart is used to connect with patients for Virtual Visits (Telemedicine).  Patients are able to view lab/test results, encounter notes, upcoming appointments, etc.  Non-urgent messages can be sent to your provider as well.   To learn more about what you can do with MyChart, go to [email or web address].    Your next appointment:   4 month(s)  Provider:   You may see Julien Nordmann, MD or one of the following Advanced Practice Providers on your designated Care Team:   Nicolasa Ducking, NP
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
## 2022-08-25 ENCOUNTER — Other Ambulatory Visit: Payer: Self-pay | Admitting: Nurse Practitioner

## 2022-09-03 ENCOUNTER — Encounter: Payer: Self-pay | Admitting: Family Medicine

## 2022-09-03 ENCOUNTER — Ambulatory Visit: Payer: Self-pay

## 2022-09-03 ENCOUNTER — Ambulatory Visit: Payer: Medicaid Other | Admitting: Family Medicine

## 2022-09-03 VITALS — BP 121/74 | HR 72 | Ht 68.0 in | Wt 201.8 lb

## 2022-09-03 DIAGNOSIS — B029 Zoster without complications: Secondary | ICD-10-CM

## 2022-09-03 MED ORDER — LIDOCAINE 5 % EX OINT: 1.0000 | TOPICAL_OINTMENT | CUTANEOUS | 0 refills | Status: AC | PRN

## 2022-09-03 MED ORDER — VALACYCLOVIR HCL 1 G PO TABS: 1000.0000 mg | ORAL_TABLET | Freq: Three times a day (TID) | ORAL | 0 refills | Status: AC

## 2022-09-03 NOTE — Progress Notes (Signed)
   Acute Office Visit  Subjective:     Patient ID: Caleb [name], male    DOB: 1955-09-12, 67 y.o.   MRN: [id number]  Chief Complaint  Patient presents with   Pain    Patient reports pain in left shoulder, arm and chest X 1 week. Reports worsening and he takes naproxen as well as other prescribed pain medications. Patient has tried heat and ice therapy, as well as icy hot and no change. Associated with rash on left chest peck radiating up to shoulder. Rash is blistering      HPI Discussed the use of AI scribe software for clinical note transcription with the patient, who gave verbal consent to proceed.  History of Present Illness   The patient presents with a week-long history of left arm pain and a rash that started this week. The pain began after spending an hour in a pool with his arms raised. The pain, initially thought to be muscular, has been worsening and is now disrupting his sleep. The pain is described as a throbbing sensation, similar to a toothache, and is located in the back and inside of the arm, extending to the underarm. The patient also reports extreme sensitivity in the shoulder area. The rash, which is spread over his body, is causing significant discomfort. The patient has a history of health issues and is currently on Lyrica and a muscle relaxer.       ROS per HPI      Objective:    BP 121/74 (BP Location: Right Arm, Patient Position: Sitting, Cuff Size: Normal)   Pulse 72   Ht 5\' 8"  (1.727 m)   Wt 201 lb 12.8 oz (91.5 kg)   SpO2 99%   BMI 30.68 kg/m    Physical Exam  Physical Exam   SKIN: Rash in left T2 area, wrapping around the back and inside of the arm, causing sensitivity to touch.       No results found for any visits on 09/03/22.      Assessment & Plan:   Problem List Items Addressed This Visit   None Visit Diagnoses     Herpes zoster without complication    -  Primary   Relevant Medications   valACYclovir (VALTREX) 1000 MG tablet            Herpes Zoster (Shingles) Acute onset of severe pain and rash in the left T2 dermatome. The rash is vesicular and wraps around the chest and back. The pain is severe and the skin is hypersensitive to touch. The rash started 5 days ago. -Start Valtrex 1g TID for 10 days. -Apply Lidocaine ointment as needed for pain. -Continue current pain regimen (Hydrocodone and Lyrica). -Advise to cover the rash to prevent transmission of the virus. -Recommend Shingles vaccine 3-6 months after resolution of the current episode.        Meds ordered this encounter  Medications   lidocaine (XYLOCAINE) 5 % ointment    Sig: Apply 1 Application topically as needed.    Dispense:  50 g    Refill:  0   valACYclovir (VALTREX) 1000 MG tablet    Sig: Take 1 tablet (1,000 mg total) by mouth 3 (three) times daily for 10 days.    Dispense:  30 tablet    Refill:  0    Return if symptoms worsen or fail to improve.  Shirlee Latch, MD

## 2022-09-03 NOTE — Telephone Encounter (Signed)
     Chief Complaint: Left shoulder and arm pain. Hurts in armpit and into chest."I think I hurt it in the pool." Symptoms: Severe pain, "can't sleep at night." Frequency: Monday, getting worse. Pertinent Negatives: Patient denies  Disposition: [] ED /[] Urgent Care (no appt availability in office) / [x] Appointment(In office/virtual)/ []  [place] [facility]/ [] Home Care/ [] Refused Recommended Disposition /[] [place] [facility]/ []  Follow-up with PCP Additional Notes: Pt. Agrees with appointment today.  Reason for Disposition  [1] Unable to use arm at all AND [2] because of shoulder pain or stiffness  Answer Assessment - Initial Assessment Questions 1. ONSET: "When did the pain start?"     Monday 2. LOCATION: "Where is the pain located?"     Left shoulder, arm and chest, back of arm 3. PAIN: "How bad is the pain?" (Scale 1-10; or mild, moderate, severe)   - MILD (1-3): doesn't interfere with normal activities   - MODERATE (4-7): interferes with normal activities (e.g., work or school) or awakens from sleep   - SEVERE (8-10): excruciating pain, unable to do any normal activities, unable to move arm at all due to pain     Severe 4. WORK OR EXERCISE: "Has there been any recent work or exercise that involved this part of the body?"     No 5. CAUSE: "What do you think is causing the shoulder pain?"     Maybe hurt it in the pool 6. OTHER SYMPTOMS: "Do you have any other symptoms?" (e.g., neck pain, swelling, rash, fever, numbness, weakness)     Hurts to move his arm 7. PREGNANCY: "Is there any chance you are pregnant?" "When was your last menstrual period?"     N/a  Protocols used: Shoulder Pain-A-AH

## 2022-09-07 ENCOUNTER — Ambulatory Visit: Payer: Medicaid Other | Admitting: Adult Health

## 2022-09-23 ENCOUNTER — Ambulatory Visit: Payer: Medicaid Other | Admitting: Family Medicine

## 2022-09-23 ENCOUNTER — Encounter: Payer: Self-pay | Admitting: Family Medicine

## 2022-09-23 ENCOUNTER — Ambulatory Visit: Payer: Self-pay

## 2022-09-23 VITALS — BP 126/78 | HR 73 | Ht 68.0 in | Wt 202.0 lb

## 2022-09-23 DIAGNOSIS — B0229 Other postherpetic nervous system involvement: Secondary | ICD-10-CM

## 2022-09-23 MED ORDER — NORTRIPTYLINE HCL 25 MG PO CAPS: 25.0000 mg | ORAL_CAPSULE | Freq: Every day | ORAL | 2 refills | Status: DC

## 2022-09-23 MED ORDER — PREDNISONE 10 MG PO TABS: ORAL_TABLET | ORAL | 0 refills | Status: DC

## 2022-09-23 NOTE — Progress Notes (Signed)
Acute Office Visit  Subjective:     Patient ID: Caleb [name], male    DOB: 1955-08-22, 67 y.o.   MRN: [id number]  Chief Complaint  Patient presents with   Medical Management of Chronic Issues    Shingles outbreak was felt Monday before labor day, patient is in pain today, pain level is about a 7     HPI Discussed the use of AI scribe software for clinical note transcription with the patient, who gave verbal consent to proceed.  History of Present Illness   The patient, with a history of shingles, presents with persistent pain three weeks after the onset of symptoms. The rash has healed, but they describe the pain as a deep ache, similar to a toothache, that is concentrated in the muscles. The pain is so severe that it has affected their ability to work and concentrate. They have tried various methods to alleviate the pain, including applying ice packs and taking hot showers, but these have provided only temporary relief. The patient also reports feeling generally unwell and is concerned about the impact of the pain on their mental focus and safety. They have been taking Lyrica 200mg  three times a day for the pain, which is the maximum dose.       ROS per HPI      Objective:    BP 126/78 (BP Location: Right Arm, Patient Position: Sitting, Cuff Size: Large)   Pulse 73   Ht 5\' 8"  (1.727 m)   Wt 202 lb (91.6 kg)   SpO2 100%   BMI 30.71 kg/m    Physical Exam Constitutional:      General: He is not in acute distress.    Appearance: Normal appearance. He is not diaphoretic.  HENT:     Head: Normocephalic.  Eyes:     Conjunctiva/sclera: Conjunctivae normal.  Pulmonary:     Effort: Pulmonary effort is normal. No respiratory distress.  Neurological:     Mental Status: He is alert and oriented to person, place, and time. Mental status is at baseline.     No results found for any visits on 09/23/22.      Assessment & Plan:   Problem List Items Addressed This Visit    None Visit Diagnoses     Post herpetic neuralgia    -  Primary           Postherpetic Neuralgia Persistent severe pain for 3 weeks following shingles outbreak. Rash has resolved. Current treatment with max dose Lyrica (200mg  TID) and Lidocaine cream (limited use due to application difficulty) insufficient for pain control. -Add Nortriptyline 25mg  at bedtime for additional pain control. -Add Prednisone 6-day taper to potentially reduce inflammation and pain. -Advise patient to discuss increased pain with pain clinic during next visit. -Check response to new treatment in 1 month.  Shingles Vaccination Patient is 3 weeks post-shingles outbreak. -Advise patient to receive shingles vaccine in late November or early December.        Meds ordered this encounter  Medications   nortriptyline (PAMELOR) 25 MG capsule    Sig: Take 1 capsule (25 mg total) by mouth at bedtime.    Dispense:  30 capsule    Refill:  2   predniSONE (DELTASONE) 10 MG tablet    Sig: Take 60mg  PO daily x1d, then 50mg  daily x1d, then 40mg  daily x1d, then 30mg  daily x1d, then 20mg  daily x1d, then 10mg  daily x1d, then stop    Dispense:  21 tablet  Refill:  0    Return in about 4 weeks (around 10/21/2022) for chronic disease f/u.  Shirlee Latch, MD

## 2022-09-23 NOTE — Telephone Encounter (Signed)
  Chief Complaint: Shingles pain Symptoms: rash resolving - pain increasing Frequency: 09/03/2022 Pertinent Negatives: Patient denies  Disposition: [] ED /[] Urgent Care (no appt availability in office) / [x] Appointment(In office/virtual)/ []  [place] [facility]/ [] Home Care/ [] Refused Recommended Disposition /[] [place] [facility]/ []  Follow-up with PCP Additional Notes: Pt called to report that rash from shingles is resolving, however pain has increased. Pt is taking Lyrica, and pain medication w/o relief.  Summary: Shingles   Patient was seen on 8/30 for shingles. Patient said they are getting worse and very painful. Please advise.     Reason for Disposition  [1] Shingles rash already diagnosed AND [2] weak immune system (e.g., HIV positive, cancer chemotherapy, chronic steroid treatment, splenectomy) AND [3] taking antiviral medication  Answer Assessment - Initial Assessment Questions 1. APPEARANCE of RASH: "Describe the rash."      Rash has mostly resolved. 2. LOCATION: "Where is the rash located?"      Same as was before 3. ONSET: "When did the rash start?"      August 4. ITCHING: "Does the rash itch?" If Yes, ask: "How bad is the itch?"  (Scale 1-10; or mild, moderate, severe)     no 5. PAIN: "Does the rash hurt?" If Yes, ask: "How bad is the pain?"  (Scale 0-10; or none, mild, moderate, severe)    - NONE (0): no pain    - MILD (1-3): doesn't interfere with normal activities     - MODERATE (4-7): interferes with normal activities or awakens from sleep     - SEVERE (8-10): excruciating pain, unable to do any normal activities     severe  Protocols used: Shingles (Zoster)-A-AH

## 2022-10-03 ENCOUNTER — Other Ambulatory Visit: Payer: Self-pay | Admitting: Cardiovascular Disease

## 2022-10-03 ENCOUNTER — Other Ambulatory Visit: Payer: Self-pay | Admitting: Family Medicine

## 2022-10-14 ENCOUNTER — Other Ambulatory Visit: Payer: Self-pay

## 2022-10-14 DIAGNOSIS — L409 Psoriasis, unspecified: Secondary | ICD-10-CM

## 2022-10-14 MED ORDER — COSENTYX SENSOREADY (300 MG) 150 MG/ML ~~LOC~~ SOAJ: SUBCUTANEOUS | 1 refills | Status: DC

## 2022-10-14 NOTE — Progress Notes (Signed)
Cosentyx refills to new pharmacy.

## 2022-10-15 ENCOUNTER — Other Ambulatory Visit: Payer: Self-pay | Admitting: Family Medicine

## 2022-10-15 ENCOUNTER — Telehealth: Payer: Self-pay | Admitting: Cardiovascular Disease

## 2022-10-15 ENCOUNTER — Telehealth: Payer: Self-pay | Admitting: Adult Health

## 2022-10-15 DIAGNOSIS — M7918 Myalgia, other site: Secondary | ICD-10-CM

## 2022-10-15 MED ORDER — ISOSORBIDE MONONITRATE ER 30 MG PO TB24: ORAL_TABLET | ORAL | 1 refills | Status: DC

## 2022-10-15 MED ORDER — ROSUVASTATIN CALCIUM 40 MG PO TABS: 40.0000 mg | ORAL_TABLET | Freq: Every day | ORAL | 0 refills | Status: DC

## 2022-10-15 MED ORDER — FLUTICASONE PROPIONATE 50 MCG/ACT NA SUSP: 2.0000 | Freq: Every day | NASAL | 3 refills | Status: DC

## 2022-10-15 MED ORDER — CETIRIZINE HCL 10 MG PO TABS: 10.0000 mg | ORAL_TABLET | Freq: Every day | ORAL | 2 refills | Status: DC

## 2022-10-15 MED ORDER — HYDROCHLOROTHIAZIDE 25 MG PO TABS: 25.0000 mg | ORAL_TABLET | Freq: Every day | ORAL | 0 refills | Status: DC

## 2022-10-15 NOTE — Telephone Encounter (Signed)
Medication Refill - Medication: fluticasone (FLONASE) 50 MCG/ACT nasal spray /isosorbide mononitrate (IMDUR) 30 MG 24 hr tablet /methocarbamol (ROBAXIN) 750 MG tablet /zyrtec 10mg /rosuvastatin (CRESTOR) 40 MG tablet   Has the patient contacted their pharmacy? yes  (Agent: If yes, when and what did the pharmacy advise?)pharmacy called directly in  Preferred Pharmacy (with phone number or street name):  [facility] [address], [place] - [address] Phone: [phone]  Fax: [phone]     Has the patient been seen for an appointment in the last year OR does the patient have an upcoming appointment? yes  Agent: Please be advised that RX refills may take up to 3 business days. We ask that you follow-up with your pharmacy.

## 2022-10-15 NOTE — Telephone Encounter (Signed)
Pharmacy change- will forward remaining RF as appropriate or refill if passes protocol Requested Prescriptions  Pending Prescriptions Disp Refills   fluticasone (FLONASE) 50 MCG/ACT nasal spray 16 mL 3    Sig: Place 2 sprays into both nostrils daily.     Ear, Nose, and Throat: Nasal Preparations - Corticosteroids Passed - 10/15/2022 12:05 PM      Passed - Valid encounter within last 12 months    Recent Outpatient Visits           3 weeks ago Post herpetic neuralgia   [place] [facility] [place], Marzella Schlein, MD   1 month ago Herpes zoster without complication   [place] [facility] [place], Marzella Schlein, MD   4 months ago Chest pain, unspecified type   [facility] [place], Marzella Schlein, MD   6 months ago Encounter for annual physical exam   [place] [facility] [place], Marzella Schlein, MD   7 months ago Cough syncope   [place] [facility] [place], Marzella Schlein, MD       Future Appointments             In 6 days Bacigalupo, Marzella Schlein, MD [facility]   In 3 weeks Richardo Hanks, Laurette Schimke, MD [facility] [place]   In 1 month Willeen Niece, MD [facility] [place]   In 2 months Brion Aliment, Dois Davenport, NP [place]             isosorbide mononitrate (IMDUR) 30 MG 24 hr tablet 45 tablet 1    Sig: Take 1/2 of a tablet daily(15 mg total) by mouth daily     Cardiovascular:  Nitrates Passed - 10/15/2022 12:05 PM      Passed - Last BP in normal range    BP Readings from Last 1 Encounters:  09/23/22 126/78         Passed - Last Heart Rate in normal range    Pulse Readings from Last 1 Encounters:  09/23/22 73         Passed - Valid encounter within last 12 months    Recent Outpatient Visits           3 weeks ago Post herpetic neuralgia   [place] [facility] [place], Marzella Schlein, MD   1  month ago Herpes zoster without complication   [place] [facility] [place], Marzella Schlein, MD   4 months ago Chest pain, unspecified type   [facility] [place], Marzella Schlein, MD   6 months ago Encounter for annual physical exam   [place] [facility] [place], Marzella Schlein, MD   7 months ago Cough syncope   [place] [facility] [place], Marzella Schlein, MD       Future Appointments             In 6 days Bacigalupo, Marzella Schlein, MD [facility]   In 3 weeks Sondra Come, MD [facility] [place]   In 1 month Willeen Niece, MD [facility] [place]   In 2 months Brion Aliment, Dois Davenport, NP [place] [facility]             cetirizine (ZYRTEC) 10 MG tablet 30 tablet 2    Sig: Take 1 tablet (10 mg total) by mouth daily.     Ear, Nose,  and Throat:  Antihistamines 2 Passed - 10/15/2022 12:05 PM      Passed - Cr in normal range and within 360 days    Creat  Date Value Ref Range Status  10/21/2016 0.98 0.70 - 1.33 mg/dL Final    Comment:    For patients >74 years of age, the reference limit for Creatinine is approximately 13% higher for people identified as African-American. .    Creatinine, Ser  Date Value Ref Range Status  04/26/2022 0.75 0.61 - 1.24 mg/dL Final         Passed - Valid encounter within last 12 months    Recent Outpatient Visits           3 weeks ago Post herpetic neuralgia   [place] [facility] [place], Marzella Schlein, MD   1 month ago Herpes zoster without complication   [place] [facility] [place], Marzella Schlein, MD   4 months ago Chest pain, unspecified type   [facility] [place], Marzella Schlein, MD   6 months ago Encounter for annual physical exam   [place] [facility] [place], Marzella Schlein, MD   7 months ago Cough syncope   [facility] [place], Marzella Schlein, MD       Future Appointments             In 6 days Bacigalupo, Marzella Schlein, MD [facility]   In 3 weeks Sondra Come, MD [facility] [place]   In 1 month Willeen Niece, MD [facility]   In 2 months Brion Aliment, Dois Davenport, NP [place] [facility]             methocarbamol (ROBAXIN) 750 MG tablet 90 tablet 3    Sig: Take 1 tablet (750 mg total) by mouth every 8 (eight) hours as needed for muscle spasms.     Not Delegated - Analgesics:  Muscle Relaxants Failed - 10/15/2022 12:05 PM      Failed - This refill cannot be delegated      Passed - Valid encounter within last 6 months    Recent Outpatient Visits           3 weeks ago Post herpetic neuralgia   [place] [facility] [place], Marzella Schlein, MD   1 month ago Herpes zoster without complication   [place] [facility] [place], Marzella Schlein, MD   4 months ago Chest pain, unspecified type   [facility] [place], Marzella Schlein, MD   6 months ago Encounter for annual physical exam   [place] [facility] [place], Marzella Schlein, MD   7 months ago Cough syncope   [employer] [facility] [place], Marzella Schlein, MD       Future Appointments             In 6 days Bacigalupo, Marzella Schlein, MD [facility]   In 3 weeks Sondra Come, MD [facility]    In 1 month Willeen Niece, MD [facility]   In 2 months Brion Aliment Dois Davenport, NP [place] [facility]             rosuvastatin (CRESTOR) 40 MG tablet 90 tablet 0    Sig: Take 1 tablet (40 mg total) by mouth daily.     Cardiovascular:  Antilipid -  Statins 2 Failed - 10/15/2022 12:05 PM      Failed - Lipid Panel in normal range within the last 12 months    Cholesterol,  Total  Date Value Ref Range Status  04/05/2022 136 100 - 199 mg/dL Final   LDL Cholesterol (Calc)  Date Value Ref Range Status  10/21/2016 68 mg/dL (calc) Final    Comment:    Reference range: <100 . Desirable range <100 mg/dL for primary prevention;   <70 mg/dL for patients with CHD or diabetic patients  with > or = 2 CHD risk factors. Marland Kitchen LDL-C is now calculated using the Martin-Hopkins  calculation, which is a validated novel method providing  better accuracy than the Friedewald equation in the  estimation of LDL-C.  Horald Pollen et al. Lenox Ahr. 1610;960(45): 2061-2068  (http://education.[employer].com/faq/FAQ164)    LDL Chol Calc (NIH)  Date Value Ref Range Status  04/05/2022 51 0 - 99 mg/dL Final   HDL  Date Value Ref Range Status  04/05/2022 48 >39 mg/dL Final   Triglycerides  Date Value Ref Range Status  04/05/2022 236 (H) 0 - 149 mg/dL Final         Passed - Cr in normal range and within 360 days    Creat  Date Value Ref Range Status  10/21/2016 0.98 0.70 - 1.33 mg/dL Final    Comment:    For patients >12 years of age, the reference limit for Creatinine is approximately 13% higher for people identified as African-American. .    Creatinine, Ser  Date Value Ref Range Status  04/26/2022 0.75 0.61 - 1.24 mg/dL Final         Passed - Patient is not pregnant      Passed - Valid encounter within last 12 months    Recent Outpatient Visits           3 weeks ago Post herpetic neuralgia   [place] [facility] [place], Marzella Schlein, MD   1 month ago Herpes zoster without complication   [place] [facility] [place], Marzella Schlein, MD   4 months ago Chest pain, unspecified type   [facility] [place] Mar[facility] Schlein, MD   6 months ago Encounter for annual physical exam   [facility] [place], Marzella Schlein, MD   7 months ago Cough syncope   [place] [facility]  [place], Marzella Schlein, MD       Future Appointments             In 6 days Bacigalupo, Marzella Schlein, MD [facility]   In 3 weeks Richardo Hanks, Laurette Schimke, MD [facility] [place]   In 1 month Willeen Niece, MD [facility] [place] [facility]   In 2 months Brion Aliment, Dois Davenport, NP [facility]

## 2022-10-15 NOTE — Telephone Encounter (Signed)
Requested medication (s) are due for refill today - no  Requested medication (s) are on the active medication list -yes  Future visit scheduled -yes  Last refill: 08/09/22 #90 3RF  Notes to clinic: Patient request mail order pharmacy- non delegated Rx  Requested Prescriptions  Pending Prescriptions Disp Refills   methocarbamol (ROBAXIN) 750 MG tablet 90 tablet 3    Sig: Take 1 tablet (750 mg total) by mouth every 8 (eight) hours as needed for muscle spasms.     Not Delegated - Analgesics:  Muscle Relaxants Failed - 10/15/2022 12:05 PM      Failed - This refill cannot be delegated      Passed - Valid encounter within last 6 months    Recent Outpatient Visits           3 weeks ago Post herpetic neuralgia   [place] [facility] [place], Marzella Schlein, MD   1 month ago Herpes zoster without complication   [place] [facility] [place], Marzella Schlein, MD   4 months ago Chest pain, unspecified type   [facility] [place], Marzella Schlein, MD   6 months ago Encounter for annual physical exam   [place] [facility], Marzella Schlein, MD   7 months ago Cough syncope   [place] [facility] [place], Marzella Schlein, MD       Future Appointments             In 6 days Bacigalupo, Marzella Schlein, MD [facility]   In 3 weeks Sondra Come, MD [facility] [place]   In 1 month Willeen Niece, MD [facility] [place] [facility]   In 2 months Creig Hines, NP [place] [facility]            Signed Prescriptions Disp Refills   fluticasone (FLONASE) 50 MCG/ACT nasal spray 16 mL 3    Sig: Place 2 sprays into both nostrils daily.     Ear, Nose, and Throat: Nasal Preparations - Corticosteroids Passed - 10/15/2022 12:05 PM      Passed - Valid encounter within last 12 months    Recent Outpatient Visits           3 weeks ago Post  herpetic neuralgia   [place] [facility] [place], Marzella Schlein, MD   1 month ago Herpes zoster without complication   [facility] [place], Marzella Schlein, MD   4 months ago Chest pain, unspecified type   [facility] [place], Marzella Schlein, MD   6 months ago Encounter for annual physical exam   [place] [facility] [place], Marzella Schlein, MD   7 months ago Cough syncope   [place] [facility] [place], Marzella Schlein, MD       Future Appointments             In 6 days Bacigalupo, Marzella Schlein, MD [facility]   In 3 weeks Sondra Come, MD [facility]    In 1 month Willeen Niece, MD [facility]   In 2 months Brion Aliment Dois Davenport, NP [place] [facility]             isosorbide mononitrate (IMDUR) 30 MG 24 hr tablet 45 tablet 1    Sig: Take 1/2 of a tablet daily(15 mg total) by mouth daily  Cardiovascular:  Nitrates Passed - 10/15/2022 12:05 PM      Passed - Last BP in normal range    BP Readings from Last 1 Encounters:  09/23/22 126/78         Passed - Last Heart Rate in normal range    Pulse Readings from Last 1 Encounters:  09/23/22 73         Passed - Valid encounter within last 12 months    Recent Outpatient Visits           3 weeks ago Post herpetic neuralgia   [place] [facility] [place], Marzella Schlein, MD   1 month ago Herpes zoster without complication   [place] [facility] [place], Marzella Schlein, MD   4 months ago Chest pain, unspecified type   [facility] [place], Marzella Schlein, MD   6 months ago Encounter for annual physical exam   [place] [place], Marzella Schlein, MD   7 months ago Cough syncope   [place] [facility] [place], Marzella Schlein, MD       Future  Appointments             In 6 days Bacigalupo, Marzella Schlein, MD [facility]   In 3 weeks Sondra Come, MD [facility] [place]   In 1 month Willeen Niece, MD [facility]   In 2 months Brion Aliment, Dois Davenport, NP [place] [facility]             cetirizine (ZYRTEC) 10 MG tablet 30 tablet 2    Sig: Take 1 tablet (10 mg total) by mouth daily.     Ear, Nose, and Throat:  Antihistamines 2 Passed - 10/15/2022 12:05 PM      Passed - Cr in normal range and within 360 days    Creat  Date Value Ref Range Status  10/21/2016 0.98 0.70 - 1.33 mg/dL Final    Comment:    For patients >49 years of age, the reference limit for Creatinine is approximately 13% higher for people identified as African-American. .    Creatinine, Ser  Date Value Ref Range Status  04/26/2022 0.75 0.61 - 1.24 mg/dL Final         Passed - Valid encounter within last 12 months    Recent Outpatient Visits           3 weeks ago Post herpetic neuralgia   [place] [facility] [place], Marzella Schlein, MD   1 month ago Herpes zoster without complication   [place] [facility] [place], Marzella Schlein, MD   4 months ago Chest pain, unspecified type   [place] [facility] [place], Marzella Schlein, MD   6 months ago Encounter for annual physical exam   [facility] [place], Marzella Schlein, MD   7 months ago Cough syncope   [facility] [place], Marzella Schlein, MD       Future Appointments             In 6 days Bacigalupo, Marzella Schlein, MD [facility], [facility]   In 3 weeks Richardo Hanks, Laurette Schimke, MD [facility] Urology    In 1 month Willeen Niece, MD [facility] [place] [facility]   In 2 months Brion Aliment, Dois Davenport, NP [facility]  rosuvastatin (CRESTOR) 40 MG tablet 90 tablet  0    Sig: Take 1 tablet (40 mg total) by mouth daily.     Cardiovascular:  Antilipid - Statins 2 Failed - 10/15/2022 12:05 PM      Failed - Lipid Panel in normal range within the last 12 months    Cholesterol, Total  Date Value Ref Range Status  04/05/2022 136 100 - 199 mg/dL Final   LDL Cholesterol (Calc)  Date Value Ref Range Status  10/21/2016 68 mg/dL (calc) Final    Comment:    Reference range: <100 . Desirable range <100 mg/dL for primary prevention;   <70 mg/dL for patients with CHD or diabetic patients  with > or = 2 CHD risk factors. Marland Kitchen LDL-C is now calculated using the Martin-Hopkins  calculation, which is a validated novel method providing  better accuracy than the Friedewald equation in the  estimation of LDL-C.  Horald Pollen et al. Lenox Ahr. 1610;960(45): 2061-2068  (http://education.Quest[employer]ostics.com/faq/FAQ164)    LDL Chol Calc (NIH)  Date Value Ref Range Status  04/05/2022 51 0 - 99 mg/dL Final   HDL  Date Value Ref Range Status  04/05/2022 48 >39 mg/dL Final   Triglycerides  Date Value Ref Range Status  04/05/2022 236 (H) 0 - 149 mg/dL Final         Passed - Cr in normal range and within 360 days    Creat  Date Value Ref Range Status  10/21/2016 0.98 0.70 - 1.33 mg/dL Final    Comment:    For patients >43 years of age, the reference limit for Creatinine is approximately 13% higher for people identified as African-American. .    Creatinine, Ser  Date Value Ref Range Status  04/26/2022 0.75 0.61 - 1.24 mg/dL Final         Passed - Patient is not pregnant      Passed - Valid encounter within last 12 months    Recent Outpatient Visits           3 weeks ago Post herpetic neuralgia   [place] [facility] [place] [place], Marzella Schlein, MD   1 month ago Herpes zoster without complication   [place] [facility] [place], Marzella Schlein, MD   4 months ago Chest pain, unspecified type   [facility]  [place], Marzella Schlein, MD   6 months ago Encounter for annual physical exam   [place] [facility] [place], Marzella Schlein, MD   7 months ago Cough syncope   [place] [facility] [place], Marzella Schlein, MD       Future Appointments             In 6 days Bacigalupo, Marzella Schlein, MD [facility]   In 3 weeks Sondra Come, MD [facility] [place]   In 1 month Willeen Niece, MD [facility] [place] [facility]   In 2 months Brion Aliment, Dois Davenport, NP [place] [facility]               Requested Prescriptions  Pending Prescriptions Disp Refills   methocarbamol (ROBAXIN) 750 MG tablet 90 tablet 3    Sig: Take 1 tablet (750 mg total) by mouth every 8 (eight) hours as needed for muscle spasms.     Not Delegated - Analgesics:  Muscle Relaxants Failed - 10/15/2022 12:05 PM      Failed - This refill cannot be delegated  Passed - Valid encounter within last 6 months    Recent Outpatient Visits           3 weeks ago Post herpetic neuralgia   [place] [facility] [place], Marzella Schlein, MD   1 month ago Herpes zoster without complication   [place] [facility] [place], Marzella Schlein, MD   4 months ago Chest pain, unspecified type   [facility] [place], Marzella Schlein, MD   6 months ago Encounter for annual physical exam   [place] [facility] [place], Marzella Schlein, MD   7 months ago Cough syncope   [place] [facility] [place], Marzella Schlein, MD       Future Appointments             In 6 days Bacigalupo, Marzella Schlein, MD [facility], [facility]   In 3 weeks Sondra Come, MD [facility] [place]   In 1 month Willeen Niece, MD [facility] [place] [facility]   In 2 months Creig Hines, NP [place] [facility]            Signed  Prescriptions Disp Refills   fluticasone (FLONASE) 50 MCG/ACT nasal spray 16 mL 3    Sig: Place 2 sprays into both nostrils daily.     Ear, Nose, and Throat: Nasal Preparations - Corticosteroids Passed - 10/15/2022 12:05 PM      Passed - Valid encounter within last 12 months    Recent Outpatient Visits           3 weeks ago Post herpetic neuralgia   [place] [facility] [place], Marzella Schlein, MD   1 month ago Herpes zoster without complication   [place] [facility] [place], Marzella Schlein, MD   4 months ago Chest pain, unspecified type   [facility] [place], Marzella Schlein, MD   6 months ago Encounter for annual physical exam   [place] [facility] [place], Marzella Schlein, MD   7 months ago Cough syncope   [place] [facility], Marzella Schlein, MD       Future Appointments             In 6 days Bacigalupo, Marzella Schlein, MD [facility]   In 3 weeks Sondra Come, MD [facility] [place]   In 1 month Willeen Niece, MD [facility] [place] [facility]   In 2 months Brion Aliment, Dois Davenport, NP [place] [facility]             isosorbide mononitrate (IMDUR) 30 MG 24 hr tablet 45 tablet 1    Sig: Take 1/2 of a tablet daily(15 mg total) by mouth daily     Cardiovascular:  Nitrates Passed - 10/15/2022 12:05 PM      Passed - Last BP in normal range    BP Readings from Last 1 Encounters:  09/23/22 126/78         Passed - Last Heart Rate in normal range    Pulse Readings from Last 1 Encounters:  09/23/22 73         Passed - Valid encounter within last 12 months    Recent Outpatient Visits           3 weeks ago Post herpetic neuralgia   [facility] [place], Marzella Schlein, MD   1  month ago Herpes zoster without complication   [place] [facility] [place], Marzella Schlein, MD   4  months ago Chest pain, unspecified type   [facility] [place], Marzella Schlein, MD   6 months ago Encounter for annual physical exam   [place] [facility] [place], Marzella Schlein, MD   7 months ago Cough syncope   [place] [facility] [place], Marzella Schlein, MD       Future Appointments             In 6 days Bacigalupo, Marzella Schlein, MD [facility], [facility]   In 3 weeks Sondra Come, MD [facility] [place]   In 1 month Willeen Niece, MD [facility] [place] [facility]   In 2 months Creig Hines, NP [place] [facility] [place]             cetirizine (ZYRTEC) 10 MG tablet 30 tablet 2    Sig: Take 1 tablet (10 mg total) by mouth daily.     Ear, Nose, and Throat:  Antihistamines 2 Passed - 10/15/2022 12:05 PM      Passed - Cr in normal range and within 360 days    Creat  Date Value Ref Range Status  10/21/2016 0.98 0.70 - 1.33 mg/dL Final    Comment:    For patients >57 years of age, the reference limit for Creatinine is approximately 13% higher for people identified as African-American. .    Creatinine, Ser  Date Value Ref Range Status  04/26/2022 0.75 0.61 - 1.24 mg/dL Final         Passed - Valid encounter within last 12 months    Recent Outpatient Visits           3 weeks ago Post herpetic neuralgia   [place] [facility] [place], Marzella Schlein, MD   1 month ago Herpes zoster without complication   [place] [facility] [place], Marzella Schlein, MD   4 months ago Chest pain, unspecified type   [place] [facility] [place], Marzella Schlein, MD   6 months ago Encounter for annual physical exam   [place] [facility] [place], Marzella Schlein, MD   7 months ago Cough syncope   [place] [facility] [place], Marzella Schlein, MD       Future Appointments             In 6 days  Bacigalupo, Marzella Schlein, MD [facility], [facility]   In 3 weeks Sondra Come, MD [facility]    In 1 month Willeen Niece, MD [facility] [place] [facility]   In 2 months Brion Aliment Dois Davenport, NP [place] [facility]             rosuvastatin (CRESTOR) 40 MG tablet 90 tablet 0    Sig: Take 1 tablet (40 mg total) by mouth daily.     Cardiovascular:  Antilipid - Statins 2 Failed - 10/15/2022 12:05 PM      Failed - Lipid Panel in normal range within the last 12 months    Cholesterol, Total  Date Value Ref Range Status  04/05/2022 136 100 - 199 mg/dL Final   LDL Cholesterol (Calc)  Date Value Ref Range Status  10/21/2016 68 mg/dL (calc) Final    Comment:    Reference range: <100 . Desirable range <100 mg/dL for primary prevention;   <  70 mg/dL for patients with CHD or diabetic patients  with > or = 2 CHD risk factors. Marland Kitchen LDL-C is now calculated using the Martin-Hopkins  calculation, which is a validated novel method providing  better accuracy than the Friedewald equation in the  estimation of LDL-C.  Horald Pollen et al. Lenox Ahr. 6578;469(62): 2061-2068  (http://education.QuestDiagnostics.com/faq/FAQ164)    LDL Chol Calc (NIH)  Date Value Ref Range Status  04/05/2022 51 0 - 99 mg/dL Final   HDL  Date Value Ref Range Status  04/05/2022 48 >39 mg/dL Final   Triglycerides  Date Value Ref Range Status  04/05/2022 236 (H) 0 - 149 mg/dL Final         Passed - Cr in normal range and within 360 days    Creat  Date Value Ref Range Status  10/21/2016 0.98 0.70 - 1.33 mg/dL Final    Comment:    For patients >58 years of age, the reference limit for Creatinine is approximately 13% higher for people identified as African-American. .    Creatinine, Ser  Date Value Ref Range Status  04/26/2022 0.75 0.61 - 1.24 mg/dL Final         Passed - Patient is not pregnant      Passed - Valid encounter within last 12 months     Recent Outpatient Visits           3 weeks ago Post herpetic neuralgia   [place] [facility] [place], Marzella Schlein, MD   1 month ago Herpes zoster without complication   [facility] [place], Marzella Schlein, MD   4 months ago Chest pain, unspecified type   [facility] [place], Marzella Schlein, MD   6 months ago Encounter for annual physical exam   [place] [facility] [place], Marzella Schlein, MD   7 months ago Cough syncope   [place] [facility] [place], Marzella Schlein, MD       Future Appointments             In 6 days Bacigalupo, Marzella Schlein, MD [facility], [facility]   In 3 weeks Richardo Hanks, Laurette Schimke, MD [facility] [place]   In 1 month Willeen Niece, MD [facility] [place] [facility]   In 2 months Brion Aliment, Dois Davenport, NP [facility]

## 2022-10-15 NOTE — Telephone Encounter (Signed)
*  STAT* If patient is at the pharmacy, call can be transferred to refill team.   1. Which medications need to be refilled? (please list name of each medication and dose if known) hydrochlorothiazide (HYDRODIURIL) 25 MG tablet   2. Which pharmacy/location (including street and city if local pharmacy) is medication to be sent to? [facility] - [address] [place] - [address]   3. Do they need a 30 day or 90 day supply? 90

## 2022-10-15 NOTE — Telephone Encounter (Signed)
New message   1. Which medications need to be refilled? (please list name of each medication and dose if known)   montelukast (SINGULAIR) 10 MG tablet   umeclidinium-vilanterol (ANORO ELLIPTA) 62.5-25 MCG/ACT AEPB    2. Which pharmacy/location (including street and city if local pharmacy) is medication to be sent to?[facility]  phone [phone]  fax # [phone]    3. Do they need a 30 day or 90 day supply? 30 day supply

## 2022-10-19 ENCOUNTER — Other Ambulatory Visit: Payer: Self-pay | Admitting: *Deleted

## 2022-10-19 MED ORDER — UMECLIDINIUM-VILANTEROL 62.5-25 MCG/ACT IN AEPB: 1.0000 | INHALATION_SPRAY | Freq: Every day | RESPIRATORY_TRACT | 3 refills | Status: AC

## 2022-10-19 MED ORDER — MONTELUKAST SODIUM 10 MG PO TABS: 10.0000 mg | ORAL_TABLET | Freq: Every day | ORAL | 1 refills | Status: DC

## 2022-10-19 NOTE — Telephone Encounter (Signed)
Received form from [facility] requesting refills on Anoro and Singular.  Called and spoke with patient, verified that this is the pharmacy that he wants to use as he is going to start on Medicare next month.  Scripts sent to [facility].  Discontinued scripts at local pharmacy.  Nothing further needed.

## 2022-10-19 NOTE — Telephone Encounter (Signed)
Missy from [facility] checking on message for refills. Missy phone number is [phone].

## 2022-10-21 ENCOUNTER — Ambulatory Visit: Payer: Medicaid Other | Admitting: Family Medicine

## 2022-10-21 ENCOUNTER — Encounter: Payer: Self-pay | Admitting: Family Medicine

## 2022-10-21 VITALS — BP 99/63 | HR 86 | Temp 97.9°F | Ht 68.0 in | Wt 205.0 lb

## 2022-10-21 DIAGNOSIS — J449 Chronic obstructive pulmonary disease, unspecified: Secondary | ICD-10-CM

## 2022-10-21 DIAGNOSIS — E7800 Pure hypercholesterolemia, unspecified: Secondary | ICD-10-CM

## 2022-10-21 DIAGNOSIS — I10 Essential (primary) hypertension: Secondary | ICD-10-CM

## 2022-10-21 DIAGNOSIS — B0229 Other postherpetic nervous system involvement: Secondary | ICD-10-CM

## 2022-10-21 DIAGNOSIS — R7303 Prediabetes: Secondary | ICD-10-CM

## 2022-10-21 DIAGNOSIS — I25118 Atherosclerotic heart disease of native coronary artery with other forms of angina pectoris: Secondary | ICD-10-CM

## 2022-10-21 MED ORDER — ISOSORBIDE MONONITRATE ER 30 MG PO TB24: 30.0000 mg | ORAL_TABLET | Freq: Every day | ORAL | 1 refills | Status: DC

## 2022-10-21 MED ORDER — ALBUTEROL SULFATE HFA 108 (90 BASE) MCG/ACT IN AERS: 2.0000 | INHALATION_SPRAY | Freq: Four times a day (QID) | RESPIRATORY_TRACT | 6 refills | Status: DC | PRN

## 2022-10-21 NOTE — Assessment & Plan Note (Signed)
Improvement in symptoms but still some residual pain. Currently on Nortriptyline and Lyrica for management. -Continue Nortriptyline and Lyrica. -Plan for Shingles vaccine in February 2025, 6 months post-herpes zoster infection.

## 2022-10-21 NOTE — Assessment & Plan Note (Signed)
Stable on Rosuvastatin 40mg  daily. -Continue Rosuvastatin 40mg  daily.

## 2022-10-21 NOTE — Progress Notes (Signed)
Established Patient Office Visit  Subjective   Patient ID: Caleb [name], male    DOB: Feb 16, 1955  Age: 67 y.o. MRN: [id number]  Chief Complaint  Patient presents with   Follow-up    HPI  Discussed the use of AI scribe software for clinical note transcription with the patient, who gave verbal consent to proceed.  History of Present Illness   The patient, with a history of shingles, presents with persistent post-herpetic neuralgia. He describes the pain as 'still got it a little bit,' localized to the shoulder blade where the shingles rash initially appeared. The pain has improved, but he still has 'pretty good size bumps on the back of my arm.' The patient is currently taking nortriptyline for the neuralgia, which he believes is helping. He is also on Lyrica for the same issue.  The patient also requests a prescription for albuterol, as he has lost his previous inhaler and does not use it frequently. He has no other concerns at this time. The patient is due for a shingles vaccine, which he plans to receive in February, six months after the onset of his shingles.         ROS    Objective:     BP 99/63   Pulse 86   Temp 97.9 F (36.6 C) (Oral)   Ht 5\' 8"  (1.727 m)   Wt 205 lb (93 kg)   SpO2 98%   BMI 31.17 kg/m    Physical Exam Vitals reviewed.  Constitutional:      General: He is not in acute distress.    Appearance: Normal appearance. He is not diaphoretic.  HENT:     Head: Normocephalic and atraumatic.  Eyes:     General: No scleral icterus.    Conjunctiva/sclera: Conjunctivae normal.  Cardiovascular:     Rate and Rhythm: Normal rate and regular rhythm.     Heart sounds: Normal heart sounds. No murmur heard. Pulmonary:     Effort: Pulmonary effort is normal. No respiratory distress.     Breath sounds: Normal breath sounds. No wheezing or rhonchi.  Musculoskeletal:     Cervical back: Neck supple.     Right lower leg: No edema.     Left lower leg: No  edema.  Lymphadenopathy:     Cervical: No cervical adenopathy.  Skin:    General: Skin is warm and dry.     Findings: No rash.  Neurological:     Mental Status: He is alert and oriented to person, place, and time. Mental status is at baseline.  Psychiatric:        Mood and Affect: Mood normal.        Behavior: Behavior normal.      No results found for any visits on 10/21/22.    The ASCVD Risk score (Arnett DK, et al., 2019) failed to calculate for the following reasons:   The patient has a prior MI or stroke diagnosis    Assessment & Plan:   Problem List Items Addressed This Visit       Cardiovascular and Mediastinum   Essential hypertension - Primary    Well controlled on current regimen of Amlodipine 2.5mg  daily, Hydrochlorothiazide 25mg  daily, and Losartan 50mg  daily. -Continue current antihypertensive regimen.      Relevant Medications   isosorbide mononitrate (IMDUR) 30 MG 24 hr tablet   Other Relevant Orders   Basic Metabolic Panel (BMET)   CAD (coronary artery disease), native coronary artery    Stable on  Isosorbide mononitrate. -Continue Isosorbide mononitrate, updated to full pill daily.      Relevant Medications   isosorbide mononitrate (IMDUR) 30 MG 24 hr tablet     Respiratory   COPD (chronic obstructive pulmonary disease) (HCC)    Request for Albuterol refill. -[facility]      Relevant Medications   albuterol (VENTOLIN HFA) 108 (90 Base) MCG/ACT inhaler     Nervous and Auditory   Post herpetic neuralgia    Improvement in symptoms but still some residual pain. Currently on Nortriptyline and Lyrica for management. -Continue Nortriptyline and Lyrica. -Plan for Shingles vaccine in February 2025, 6 months post-herpes zoster infection.        Other   Hyperlipidemia    Stable on Rosuvastatin 40mg  daily. -Continue Rosuvastatin 40mg  daily.      Relevant Medications   isosorbide mononitrate (IMDUR) 30 MG 24 hr tablet    Other Relevant Orders   Lipid panel   Prediabetes    Recommend low carb diet Recheck A1c       Relevant Orders   Hemoglobin A1c        General Health Maintenance -Order labs today including kidney function, liver function, A1C, and cholesterol. -Schedule physical in April 2025.        Return in about 6 months (around 04/21/2023) for CPE.    Shirlee Latch, MD

## 2022-10-21 NOTE — Assessment & Plan Note (Signed)
Stable on Isosorbide mononitrate. -Continue Isosorbide mononitrate, updated to full pill daily.

## 2022-10-21 NOTE — Assessment & Plan Note (Signed)
Request for Albuterol refill. -[facility].

## 2022-10-21 NOTE — Assessment & Plan Note (Signed)
Recommend low carb diet °Recheck A1c  °

## 2022-10-21 NOTE — Assessment & Plan Note (Signed)
Well controlled on current regimen of Amlodipine 2.5mg  daily, Hydrochlorothiazide 25mg  daily, and Losartan 50mg  daily. -Continue current antihypertensive regimen.

## 2022-10-22 LAB — BASIC METABOLIC PANEL
BUN/Creatinine Ratio: 16 (ref 10–24)
BUN: 12 mg/dL (ref 8–27)
CO2: 22 mmol/L (ref 20–29)
Calcium: 9.3 mg/dL (ref 8.6–10.2)
Chloride: 95 mmol/L — ABNORMAL LOW (ref 96–106)
Creatinine, Ser: 0.76 mg/dL (ref 0.76–1.27)
Glucose: 119 mg/dL — ABNORMAL HIGH (ref 70–99)
Potassium: 3.8 mmol/L (ref 3.5–5.2)
Sodium: 138 mmol/L (ref 134–144)
eGFR: 100 mL/min/{1.73_m2} (ref >59)

## 2022-10-22 LAB — LIPID PANEL
Chol/HDL Ratio: 3.2 {ratio} (ref 0.0–5.0)
Cholesterol, Total: 124 mg/dL (ref 100–199)
HDL: 39 mg/dL — ABNORMAL LOW (ref >39)
LDL Chol Calc (NIH): 49 mg/dL (ref 0–99)
Triglycerides: 223 mg/dL — ABNORMAL HIGH (ref 0–149)
VLDL Cholesterol Cal: 36 mg/dL (ref 5–40)

## 2022-10-22 LAB — HEMOGLOBIN A1C
Est. average glucose Bld gHb Est-mCnc: 131 mg/dL
Hgb A1c MFr Bld: 6.2 % — ABNORMAL HIGH (ref 4.8–5.6)

## 2022-10-23 ENCOUNTER — Other Ambulatory Visit: Payer: Self-pay | Admitting: Family Medicine

## 2022-10-27 ENCOUNTER — Ambulatory Visit: Payer: Medicaid Other | Admitting: Urology
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
## 2022-11-01 ENCOUNTER — Ambulatory Visit: Payer: Medicaid Other | Attending: Pain Medicine | Admitting: Pain Medicine

## 2022-11-01 ENCOUNTER — Encounter: Payer: Self-pay | Admitting: Pain Medicine

## 2022-11-01 VITALS — BP 158/89 | HR 79 | Temp 97.9°F | Ht 68.0 in | Wt 204.0 lb

## 2022-11-01 DIAGNOSIS — M5412 Radiculopathy, cervical region: Secondary | ICD-10-CM

## 2022-11-01 DIAGNOSIS — G8929 Other chronic pain: Secondary | ICD-10-CM | POA: Insufficient documentation

## 2022-11-01 DIAGNOSIS — G5791 Unspecified mononeuropathy of right lower limb: Secondary | ICD-10-CM | POA: Insufficient documentation

## 2022-11-01 DIAGNOSIS — M542 Cervicalgia: Secondary | ICD-10-CM | POA: Diagnosis present

## 2022-11-01 DIAGNOSIS — M79671 Pain in right foot: Secondary | ICD-10-CM | POA: Insufficient documentation

## 2022-11-01 DIAGNOSIS — S8411XS Injury of peroneal nerve at lower leg level, right leg, sequela: Secondary | ICD-10-CM | POA: Diagnosis present

## 2022-11-01 DIAGNOSIS — G8928 Other chronic postprocedural pain: Secondary | ICD-10-CM

## 2022-11-01 DIAGNOSIS — M792 Neuralgia and neuritis, unspecified: Secondary | ICD-10-CM | POA: Diagnosis present

## 2022-11-01 DIAGNOSIS — G5731 Lesion of lateral popliteal nerve, right lower limb: Secondary | ICD-10-CM | POA: Insufficient documentation

## 2022-11-01 DIAGNOSIS — M5030 Other cervical disc degeneration, unspecified cervical region: Secondary | ICD-10-CM

## 2022-11-01 DIAGNOSIS — M47812 Spondylosis without myelopathy or radiculopathy, cervical region: Secondary | ICD-10-CM

## 2022-11-01 DIAGNOSIS — M5010 Cervical disc disorder with radiculopathy, unspecified cervical region: Secondary | ICD-10-CM

## 2022-11-01 DIAGNOSIS — M4802 Spinal stenosis, cervical region: Secondary | ICD-10-CM

## 2022-11-01 DIAGNOSIS — R937 Abnormal findings on diagnostic imaging of other parts of musculoskeletal system: Secondary | ICD-10-CM

## 2022-11-01 NOTE — Patient Instructions (Signed)

## 2022-11-01 NOTE — Progress Notes (Signed)
Safety precautions to be maintained throughout the outpatient stay will include: orient to surroundings, keep bed in low position, maintain call bell within reach at all times, provide assistance with transfer out of bed and ambulation.  

## 2022-11-02 ENCOUNTER — Ambulatory Visit: Payer: Self-pay

## 2022-11-02 NOTE — Telephone Encounter (Signed)
Chief Complaint: Medication Question   Disposition: [] ED /[] Urgent Care (no appt availability in office) / [] Appointment(In office/virtual)/ []  [place] [facility]/ [x] Home Care/ [] Refused Recommended Disposition /[] [place] [facility]/ []  Follow-up with PCP Additional Notes: Sarah, Pharmacist from [facility] called to clarify directions for the Isosorbide 30MG  order. Per PCP notes patient should be taking Isosorbide 30 MG 1 tablet PO daily as of 10/21/22. Sarah verbalized understanding and stated she will update the instructions and fill the medication for the patient.   Reason for Disposition  Caller has medicine question only, adult not sick, AND triager answers question  Answer Assessment - Initial Assessment Questions 1. NAME of MEDICINE: "What medicine(s) are you calling about?"     isosorbide mononitrate 30 MG  2. QUESTION: "What is your question?" (e.g., double dose of medicine, side effect)     Can you please clarify the order for this medication  3. PRESCRIBER: "Who prescribed the medicine?" Reason: if prescribed by specialist, call should be referred to that group.     Dr. Beryle Flock  Protocols used: Medication Question Call-A-AH

## 2022-11-03 ENCOUNTER — Ambulatory Visit: Payer: Medicaid Other | Admitting: Urology

## 2022-11-08 ENCOUNTER — Other Ambulatory Visit: Payer: Self-pay | Admitting: Family Medicine

## 2022-11-08 MED ORDER — ISOSORBIDE MONONITRATE ER 30 MG PO TB24: 30.0000 mg | ORAL_TABLET | Freq: Every day | ORAL | 1 refills | Status: DC

## 2022-11-11 ENCOUNTER — Ambulatory Visit: Payer: Medicaid Other | Admitting: Urology

## 2022-11-12 ENCOUNTER — Other Ambulatory Visit: Payer: Self-pay

## 2022-11-12 MED ORDER — ISOSORBIDE MONONITRATE ER 30 MG PO TB24: 30.0000 mg | ORAL_TABLET | Freq: Every day | ORAL | 1 refills | Status: DC

## 2022-11-15 ENCOUNTER — Ambulatory Visit: Payer: Medicaid Other | Admitting: Dermatology

## 2022-11-16 ENCOUNTER — Other Ambulatory Visit: Payer: Self-pay | Admitting: Dermatology

## 2022-11-16 DIAGNOSIS — L409 Psoriasis, unspecified: Secondary | ICD-10-CM

## 2022-11-23 ENCOUNTER — Ambulatory Visit: Payer: 59 | Attending: Pain Medicine | Admitting: Pain Medicine

## 2022-11-23 ENCOUNTER — Ambulatory Visit
Admission: RE | Admit: 2022-11-23 | Discharge: 2022-11-23 | Disposition: A | Discharge status: Home / Self Care | Payer: 59 | Source: Ambulatory Visit | Attending: Pain Medicine | Admitting: Pain Medicine

## 2022-11-23 ENCOUNTER — Ambulatory Visit: Payer: Medicaid Other | Admitting: Urology

## 2022-11-23 ENCOUNTER — Encounter: Payer: Self-pay | Admitting: Pain Medicine

## 2022-11-23 VITALS — BP 110/70 | HR 85 | Temp 98.1°F | Resp 17 | Ht 68.0 in | Wt 205.0 lb

## 2022-11-23 DIAGNOSIS — G5731 Lesion of lateral popliteal nerve, right lower limb: Secondary | ICD-10-CM | POA: Insufficient documentation

## 2022-11-23 DIAGNOSIS — G8929 Other chronic pain: Secondary | ICD-10-CM | POA: Diagnosis not present

## 2022-11-23 DIAGNOSIS — S8411XA Injury of peroneal nerve at lower leg level, right leg, initial encounter: Secondary | ICD-10-CM | POA: Insufficient documentation

## 2022-11-23 DIAGNOSIS — M25571 Pain in right ankle and joints of right foot: Secondary | ICD-10-CM | POA: Diagnosis not present

## 2022-11-23 DIAGNOSIS — M79671 Pain in right foot: Secondary | ICD-10-CM | POA: Insufficient documentation

## 2022-11-23 DIAGNOSIS — G5791 Unspecified mononeuropathy of right lower limb: Secondary | ICD-10-CM | POA: Insufficient documentation

## 2022-11-23 DIAGNOSIS — X58XXXA Exposure to other specified factors, initial encounter: Secondary | ICD-10-CM | POA: Insufficient documentation

## 2022-11-23 DIAGNOSIS — I10 Essential (primary) hypertension: Secondary | ICD-10-CM | POA: Insufficient documentation

## 2022-11-23 DIAGNOSIS — S8411XS Injury of peroneal nerve at lower leg level, right leg, sequela: Secondary | ICD-10-CM

## 2022-11-23 DIAGNOSIS — M792 Neuralgia and neuritis, unspecified: Secondary | ICD-10-CM

## 2022-11-23 MED ORDER — MIDAZOLAM HCL 5 MG/5ML IJ SOLN
INTRAMUSCULAR | Status: AC
  Filled 2022-11-23: qty 5

## 2022-11-23 MED ORDER — TRIAMCINOLONE ACETONIDE 40 MG/ML IJ SUSP
INTRAMUSCULAR | Status: AC
  Filled 2022-11-23: qty 1

## 2022-11-23 MED ORDER — LIDOCAINE HCL 2 % IJ SOLN
20.0000 mL | Freq: Once | INTRAMUSCULAR | Status: AC
  Administered 2022-11-23: 100 mg

## 2022-11-23 MED ORDER — LIDOCAINE HCL (PF) 2 % IJ SOLN
INTRAMUSCULAR | Status: AC
Start: 2022-11-23 — End: ?
  Filled 2022-11-23: qty 10

## 2022-11-23 MED ORDER — PENTAFLUOROPROP-TETRAFLUOROETH EX AERO
INHALATION_SPRAY | Freq: Once | CUTANEOUS | Status: AC
  Administered 2022-11-23: 30 via TOPICAL

## 2022-11-23 MED ORDER — MIDAZOLAM HCL 5 MG/5ML IJ SOLN
0.5000 mg | Freq: Once | INTRAMUSCULAR | Status: AC
  Administered 2022-11-23: 3 mg via INTRAVENOUS

## 2022-11-23 MED ORDER — FENTANYL CITRATE (PF) 100 MCG/2ML IJ SOLN
25.0000 ug | INTRAMUSCULAR | Status: DC | PRN
  Administered 2022-11-23: 50 ug via INTRAVENOUS

## 2022-11-23 MED ORDER — TRIAMCINOLONE ACETONIDE 40 MG/ML IJ SUSP
40.0000 mg | Freq: Once | INTRAMUSCULAR | Status: AC
  Administered 2022-11-23: 40 mg

## 2022-11-23 MED ORDER — LACTATED RINGERS IV SOLN: Freq: Once | INTRAVENOUS | Status: AC

## 2022-11-23 MED ORDER — ROPIVACAINE HCL 2 MG/ML IJ SOLN
9.0000 mL | Freq: Once | INTRAMUSCULAR | Status: AC
  Administered 2022-11-23: 9 mL via INTRA_ARTICULAR

## 2022-11-23 MED ORDER — ROPIVACAINE HCL 2 MG/ML IJ SOLN
INTRAMUSCULAR | Status: AC
  Filled 2022-11-23: qty 20

## 2022-11-23 MED ORDER — FENTANYL CITRATE (PF) 100 MCG/2ML IJ SOLN
INTRAMUSCULAR | Status: AC
  Filled 2022-11-23: qty 2

## 2022-11-23 NOTE — Progress Notes (Signed)
Safety precautions to be maintained throughout the outpatient stay will include: orient to surroundings, keep bed in low position, maintain call bell within reach at all times, provide assistance with transfer out of bed and ambulation.  

## 2022-11-23 NOTE — Patient Instructions (Signed)

## 2022-11-23 NOTE — Progress Notes (Signed)
PROVIDER NOTE: Interpretation of information contained herein should be left to medically-trained personnel. Specific patient instructions are provided elsewhere under "Patient Instructions" section of medical record. This document was created in part using STT-dictation technology, any transcriptional errors that may result from this process are unintentional.  Patient: Caleb [name] Type: Established DOB: 05/13/1955 MRN: [id number] PCP: Erasmo Downer, MD  Service: Procedure DOS: 11/23/2022 Setting: Ambulatory Location: Ambulatory outpatient facility Delivery: Face-to-face Provider: Oswaldo Done, MD Specialty: Interventional Pain Management Specialty designation: 09 Location: Outpatient facility Ref. Prov.: Bacigalupo, Marzella Schlein, MD       Interventional Therapy   Primary Reason for Visit: Interventional Pain Management Treatment. CC: Foot Pain (right)  Procedure:           Type: Therapeutic Superficial Peroneal/Fibular (S-PN)           Nerve Block #7  Laterality: Right  Level: Distal lower extremity.  Imaging: Fluoroscopic guidance Anesthesia: Local anesthesia (1-2% Lidocaine) Anxiolysis: IV Versed 3.0 mg Sedation: Moderate Sedation Fentanyl 1 mL (50 mcg) DOS: 11/23/2022  Performed by: Oswaldo Done, MD  Purpose: Diagnostic/Therapeutic Indications: Right ankle and foot pain severe enough to impact quality of life or function. 1. Chronic foot pain (1ry area of Pain) (Right)   2. Chronic ankle pain (2ry area of Pain) (Right)   3. Disorder of superficial peroneal nerve (Right)   4. 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 Neurogenic foot pain (Right)   6. Neuropathy of right foot   7. Neuropathic pain    NAS-11 Pain score:   Pre-procedure: 6 /10   Post-procedure: 0-No pain/10      Position / Prep / Materials:  Position: Supine Prep solution: ChloraPrep (2% chlorhexidine gluconate and 70% isopropyl alcohol) Prep Area: Entire distal  anterolateral lower extremity region  Materials:  Tray: Block Needle(s):  Type: Regular  Gauge (G): 25  Length: 1.5-in  Qty: 1  H&P (Pre-op Assessment):  [name] is a 67 y.o. (year old), male patient, seen today for interventional treatment. He  has a past surgical history that includes Lumbar laminectomy (1989, 1999); Cervical fusion (1988, 1998); Liver surgery (2015); Partial colectomy (1990); Inguinal hernia repair (Bilateral, 1975); Lithotripsy; Colonoscopy with propofol (N/A, 02/24/2017); Cardiac catheterization (2015); Arthrodesis metatarsal (Right, 11/26/2019); Lesion removal (Right, 11/26/2019); LEFT HEART CATH AND CORONARY ANGIOGRAPHY (Left, 02/09/2022); CORONARY PRESSURE/FFR STUDY (Right, 02/09/2022); Back surgery; Colon surgery; and Colonoscopy with propofol (N/A, 05/03/2022). [name] has a current medication list which includes the following prescription(s): albuterol, amlodipine, aspirin ec, cetirizine, cosentyx sensoready (300 mg), fluticasone, hydrochlorothiazide, ibuprofen, isosorbide mononitrate, lidocaine, losartan, magnesium hydroxide, melatonin, methotrexate, montelukast, naloxone, nitroglycerin, nortriptyline, prednisone, pregabalin, rosuvastatin, triamcinolone ointment, turmeric, umeclidinium-vilanterol, and methocarbamol, and the following Facility-Administered Medications: fentanyl and lactated ringers. His primarily concern today is the Foot Pain (right)  Initial Vital Signs:  Pulse/HCG Rate: 85ECG Heart Rate: 78 (NSR) Temp: 98.1 F (36.7 C) Resp: 18 BP: 116/79 SpO2: 99 %  BMI: Estimated body mass index is 31.17 kg/m as calculated from the following:   Height as of this encounter: 5\' 8"  (1.727 m).   Weight as of this encounter: 205 lb (93 kg).  Risk Assessment: Allergies: Reviewed. He is allergic to lisinopril and oxycodone.  Allergy Precautions: None required Coagulopathies: Reviewed. None identified.  Blood-thinner therapy: None at this time Active  Infection(s): Reviewed. None identified. [name] is afebrile  Site Confirmation: [name] was asked to confirm the procedure and laterality before marking the site Procedure checklist: Completed Consent: Before  the procedure and under the influence of no sedative(s), amnesic(s), or anxiolytics, the patient was informed of the treatment options, risks and possible complications. To fulfill our ethical and legal obligations, as recommended by the American Medical Association's Code of Ethics, I have informed the patient of my clinical impression; the nature and purpose of the treatment or procedure; the risks, benefits, and possible complications of the intervention; the alternatives, including doing nothing; the risk(s) and benefit(s) of the alternative treatment(s) or procedure(s); and the risk(s) and benefit(s) of doing nothing. The patient was provided information about the general risks and possible complications associated with the procedure. These may include, but are not limited to: failure to achieve desired goals, infection, bleeding, organ or nerve damage, allergic reactions, paralysis, and death. In addition, the patient was informed of those risks and complications associated to the procedure, such as failure to decrease pain; infection; bleeding; organ or nerve damage with subsequent damage to sensory, motor, and/or autonomic systems, resulting in permanent pain, numbness, and/or weakness of one or several areas of the body; allergic reactions; (i.e.: anaphylactic reaction); and/or death. Furthermore, the patient was informed of those risks and complications associated with the medications. These include, but are not limited to: allergic reactions (i.e.: anaphylactic or anaphylactoid reaction(s)); adrenal axis suppression; blood sugar elevation that in diabetics may result in ketoacidosis or comma; water retention that in patients with history of congestive heart failure may result in  shortness of breath, pulmonary edema, and decompensation with resultant heart failure; weight gain; swelling or edema; medication-induced neural toxicity; particulate matter embolism and blood vessel occlusion with resultant organ, and/or nervous system infarction; and/or aseptic necrosis of one or more joints. Finally, the patient was informed that Medicine is not an exact science; therefore, there is also the possibility of unforeseen or unpredictable risks and/or possible complications that may result in a catastrophic outcome. The patient indicated having understood very clearly. We have given the patient no guarantees and we have made no promises. Enough time was given to the patient to ask questions, all of which were answered to the patient's satisfaction. Mr. Criado has indicated that he wanted to continue with the procedure. Attestation: I, the ordering provider, attest that I have discussed with the patient the benefits, risks, side-effects, alternatives, likelihood of achieving goals, and potential problems during recovery for the procedure that I have provided informed consent. Date  Time: 11/23/2022  9:05 AM  Pre-Procedure Preparation:  Monitoring: As per clinic protocol. Respiration, ETCO2, SpO2, BP, heart rate and rhythm monitor placed and checked for adequate function Safety Precautions: Patient was assessed for positional comfort and pressure points before starting the procedure. Time-out: I initiated and conducted the "Time-out" before starting the procedure, as per protocol. The patient was asked to participate by confirming the accuracy of the "Time Out" information. Verification of the correct person, site, and procedure were performed and confirmed by me, the nursing staff, and the patient. "Time-out" conducted as per Joint Commission's Universal Protocol (UP.01.01.01). Time: 1002 Start Time: 1002 hrs.  Description/Narrative of Procedure:          Target: For the superficial  peroneal [fibular] nerve, the target area is the lateral aspect of the distal lower extremity, above the lateral malleolus, as the nerve exits the crural fascia and the lateral compartment of the leg.  Approach: Percutaneous   Rationale (medical necessity): procedure needed and proper for the diagnosis and/or treatment of the patient's medical symptoms and needs. Procedural Technique Safety Precautions: Aspiration looking for  blood return was conducted prior to all injections. At no point did we inject any substances, as a needle was being advanced. No attempts were made at seeking any paresthesias. Safe injection practices and needle disposal techniques used. Medications properly checked for expiration dates. SDV (single dose vial) medications used. Description of the Procedure: Protocol guidelines were followed. The patient was assisted into a comfortable position. The target area was identified and the area prepped in the usual manner. Skin & deeper tissues infiltrated with local anesthetic. Appropriate amount of time allowed to pass for local anesthetics to take effect. The procedure needles were then advanced to the target area. Proper needle placement secured. Negative aspiration confirmed. Solution injected in intermittent fashion, asking for systemic symptoms every 0.5cc of injectate. The needles were then removed and the area cleansed, making sure to leave some of the prepping solution back to take advantage of its long term bactericidal properties.                     Vitals:   11/23/22 1000 11/23/22 1006 11/23/22 1016 11/23/22 1026  BP: 109/72 108/74 105/67 110/70  Pulse:      Resp: 15 17 16 17   Temp:      SpO2: 97% 97% 96% 97%  Weight:      Height:         Start Time: 1002 hrs. End Time: 1006 hrs.  Materials:  Needle(s) Type: Spinal Needle Gauge: 22G Length: 3.5-in Medication(s): Please see orders for medications and dosing details.  Imaging Guidance (Non-Spinal):           Type of Imaging Technique: Fluoroscopy Guidance (Non-Spinal) Indication(s): Fluoroscopy guidance for needle placement to enhance accuracy in procedures requiring precise needle localization for targeted delivery of medication in or near specific anatomical locations not easily accessible without such real-time imaging assistance. Exposure Time: Please see nurses notes. Contrast: None used. Fluoroscopic Guidance: I was personally present during the use of fluoroscopy. "Tunnel Vision Technique" used to obtain the best possible view of the target area. Parallax error corrected before commencing the procedure. "Direction-depth-direction" technique used to introduce the needle under continuous pulsed fluoroscopy. Once target was reached, antero-posterior, oblique, and lateral fluoroscopic projection used confirm needle placement in all planes. Images permanently stored in EMR. Interpretation: No contrast injected. I personally interpreted the imaging intraoperatively. Adequate needle placement confirmed in multiple planes. Permanent images saved into the patient's record.  Antibiotic Prophylaxis:   Anti-infectives (From admission, onward)    None      Indication(s): None identified  Post-operative Assessment:  Post-procedure Vital Signs:  Pulse/HCG Rate: 8580 Temp: 98.1 F (36.7 C) Resp: 17 BP: 110/70 SpO2: 97 %  EBL: None  Complications: No immediate post-treatment complications observed by team, or reported by patient.  Note: The patient tolerated the entire procedure well. A repeat set of vitals were taken after the procedure and the patient was kept under observation following institutional policy, for this type of procedure. Post-procedural neurological assessment was performed, showing return to baseline, prior to discharge. The patient was provided with post-procedure discharge instructions, including a section on how to identify potential problems. Should any problems arise  concerning this procedure, the patient was given instructions to immediately contact us, at any time, without hesitation. In any case, we plan to contact the patient by telephone for a follow-up status report regarding this interventional procedure.  Comments:  No additional relevant information.  Plan of Care (POC)  Orders:  Orders Placed This Encounter  Procedures   Misc procedure    Type of Block:  Superficial Peroneal (Fibular) Nerve Block Side: Right-sided    Scheduling Instructions:     Sedation: Patient's choice.     Timeframe: Today     Blocked time: 20 min   DG PAIN CLINIC C-ARM 1-60 MIN NO REPORT    Intraoperative interpretation by procedural physician at [facility].    Standing Status:   Standing    Number of Occurrences:   1    Order Specific Question:   Reason for exam:    Answer:   Assistance in needle guidance and placement for procedures requiring needle placement in or near specific anatomical locations not easily accessible without such assistance.   Informed Consent Details: Physician/Practitioner Attestation; Transcribe to consent form and obtain patient signature    Nursing Order: Transcribe to consent form and obtain patient signature. Note: Always confirm laterality of pain with Mr. Rupp, before procedure.    Order Specific Question:   Physician/Practitioner attestation of informed consent for procedure/surgical case    Answer:   I, the physician/practitioner, attest that I have discussed with the patient the benefits, risks, side effects, alternatives, likelihood of achieving goals and potential problems during recovery for the procedure that I have provided informed consent.    Order Specific Question:   Procedure    Answer:   Peroneal (Fibular) Nerve Block    Order Specific Question:   Physician/Practitioner performing the procedure    Answer:   Lamarr Feenstra A. Laban Emperor, MD    Order Specific Question:   Indication/Reason    Answer:   Intractable  chronic distal lower extremity pain   Provide equipment / supplies at bedside    Procedure tray: "Block Tray" (Disposable  single use) Skin infiltration needle: Regular 1.5-in, 25-G, (x1) Block Needle type: Regular Amount/quantity: 1 Size: Short(1.5-inch) Gauge: (25G x1) + (22G x1)    Standing Status:   Standing    Number of Occurrences:   1    Order Specific Question:   Specify    Answer:   Block Tray   Chronic Opioid Analgesic:  No chronic opioid analgesics therapy prescribed by our practice. (07/27/2021) Abnormal UDS (+) Benzoylecgonine, a COCAINE metabolite.  Hydrocodone/APAP 5/325 1 tablet every 6 hours (20 mg/day of hydrocodone). MME/day: 20 mg/day.   Medications ordered for procedure: Meds ordered this encounter  Medications   lidocaine (XYLOCAINE) 2 % (with pres) injection 400 mg   pentafluoroprop-tetrafluoroeth (GEBAUERS) aerosol   lactated ringers infusion   midazolam (VERSED) 5 MG/5ML injection 0.5-2 mg    Make sure Flumazenil is available in the pyxis when using this medication. If oversedation occurs, administer 0.2 mg IV over 15 sec. If after 45 sec no response, administer 0.2 mg again over 1 min; may repeat at 1 min intervals; not to exceed 4 doses (1 mg)   fentaNYL (SUBLIMAZE) injection 25-50 mcg    Make sure Narcan is available in the pyxis when using this medication. In the event of respiratory depression (RR< 8/min): Titrate NARCAN (naloxone) in increments of 0.1 to 0.2 mg IV at 2-3 minute intervals, until desired degree of reversal.   ropivacaine (PF) 2 mg/mL (0.2%) (NAROPIN) injection 9 mL   triamcinolone acetonide (KENALOG-40) injection 40 mg   Medications administered: We administered lidocaine, pentafluoroprop-tetrafluoroeth, lactated ringers, midazolam, fentaNYL, ropivacaine (PF) 2 mg/mL (0.2%), and triamcinolone acetonide.  See the medical record for exact dosing, route, and time of administration.  Follow-up plan:   Return in about 2 weeks (  around  12/07/2022) for (Face2F), (PPE).       Interventional Therapies  Risk Factors  Considerations:   (07/27/2021) UDS (+) COCAINE  OSA  Tobacco abuse  CAD  COPD  Emphysema  HTN  Hx. MI  CHF     Planned  Pending:   Therapeutic right common peroneal/superficial peroneal NB #7  Diagnostic/therapeutic bilateral lower cervical facet MBB w/ mapping (2 weeks after right peroneal nerve block)    Under consideration:   Therapeutic/palliative left CESI #9 (1st of 2024)  Possible spinal cord stimulator trial  Possible bilateral cervical facet RFA    Completed:   Therapeutic/palliative left CESI x8 (12/18/2020) (6-0) (100/100/80/75-80)  Therapeutic midline CESI x1 (02/10/2021) (5-6) (100/100/100 x 1 week)  Diagnostic bilateral cervical facet MBB x2 (09/22/2017) (5-1) (100/100/100/>50)  Diagnostic bilateral lumbar facet MBB x1 (01/29/2020) (3-0) (100/100/90/90)  Diagnostic left SI joint block x1 (06/19/2019) (8-0) (100/100/100 x3 days/0)  Therapeutic midline serratus posterior TPI/MNB x1 (01/11/2019)  Diagnostic right Common Peroneal NB (C-PNB) x2 (04/14/2017) (5-0) (100/100/20/<25)  Therapeutic right common peroneal nerve (C-PN) RFA x1 (06/21/2017) (3-0) (100/100/100 x 3 days/75-100)  Diagnostic right superficial peroneal (S-PN) NB x5 (08/19/2020) (7-3) (100/100/100/90-100) Therapeutic right superficial peroneal nerve (S-PN) RFA x1 (10/25/2017) (4-0) (100/100/100 x1 day/25) Therapeutic right deep peroneal NB (D-PNB) x1 (05/03/2019)    Therapeutic  Palliative (PRN) options:   Palliative left CESI  Diagnostic bilateral cervical facet block #3  Palliative right superficial peroneal NB #5  Palliative right superficial peroneal nerve RFA #2    Pharmacotherapy  Nonopioids transfer 11/21/2019: Lyrica and Robaxin (07/27/2021) Abnormal UDS (+) Benzoylecgonine, a COCAINE metabolite        Recent Visits Date Type Provider Dept  11/01/22 Office Visit Delano Metz, MD [facility]   Showing recent visits within past 90 days and meeting all other requirements Today's Visits Date Type Provider Dept  11/23/22 Procedure visit Delano Metz, MD [facility]  Showing today's visits and meeting all other requirements Future Appointments Date Type Provider Dept  12/09/22 Appointment Delano Metz, MD [facility]  Showing future appointments within next 90 days and meeting all other requirements  Disposition: Discharge home  Discharge (Date  Time): 11/23/2022; 1030 hrs.   Primary Care Physician: Erasmo Downer, MD Location: [facility] Note by: Oswaldo Done, MD (TTS technology used. I apologize for any typographical errors that were not detected and corrected.) Date: 11/23/2022; Time: 10:35 AM  Disclaimer:  Medicine is not an Visual merchandiser. The only guarantee in medicine is that nothing is guaranteed. It is important to note that the decision to proceed with this intervention was based on the information collected from the patient. The Data and conclusions were drawn from the patient's questionnaire, the interview, and the physical examination. Because the information was provided in large part by the patient, it cannot be guaranteed that it has not been purposely or unconsciously manipulated. Every effort has been made to obtain as much relevant data as possible for this evaluation. It is important to note that the conclusions that lead to this procedure are derived in large part from the available data. Always take into account that the treatment will also be dependent on availability of resources and existing treatment guidelines, considered by other Pain Management Practitioners as being common knowledge and practice, at the time of the intervention. For Medico-Legal purposes, it is also important to point out that variation in procedural techniques and pharmacological choices are the acceptable norm. The  indications, contraindications, technique, and results of the above procedure should only be interpreted and judged by a Board-Certified Interventional Pain Specialist with extensive familiarity and expertise in the same exact procedure and technique.

## 2022-11-24 ENCOUNTER — Telehealth: Payer: Self-pay

## 2022-11-24 ENCOUNTER — Ambulatory Visit: Payer: 59 | Admitting: Urology

## 2022-11-24 ENCOUNTER — Encounter: Payer: Self-pay | Admitting: Urology

## 2022-11-24 VITALS — BP 102/65 | HR 94 | Ht 68.0 in | Wt 203.0 lb

## 2022-11-24 DIAGNOSIS — N529 Male erectile dysfunction, unspecified: Secondary | ICD-10-CM | POA: Diagnosis not present

## 2022-11-24 DIAGNOSIS — Z125 Encounter for screening for malignant neoplasm of prostate: Secondary | ICD-10-CM

## 2022-11-24 MED ORDER — SILDENAFIL CITRATE 100 MG PO TABS: 100.0000 mg | ORAL_TABLET | Freq: Every day | ORAL | 3 refills | Status: DC | PRN

## 2022-11-24 NOTE — Telephone Encounter (Signed)
Post procedure follow up.  Patient states he is doing great.

## 2022-11-24 NOTE — Progress Notes (Signed)
   11/24/2022 11:07 AM   Caleb [name] [birth year] [id number]  Reason for visit: Follow up ED, PSA screening  HPI: 67 year old comorbid male with chronic pain on narcotics who I have followed for ED an PSA screening.  He previously was using Cialis 20 mg on demand with good results, but was having significant headaches at that dose.  He transition to 100 mg sildenafil on demand and has had good results with that.  He denies any side effects with that medication and is interested in refilling.  Nitrates are on his medication list, but he has not taken these recently, and I again stressed the importance of not taking his medications together and the risk of hypotension.  PSA was checked in March 2023 and was 1.1, reassurance provided regarding normal PSA, as well as the AUA guidelines regarding screening every 2 to 4 years.  Sildenafil 100 mg on demand sent to [email or web address] RTC 1 year  Sondra Come, MD  [facility] [address], [address] [place], [place] [address] [phone]

## 2022-11-25 DIAGNOSIS — H52222 Regular astigmatism, left eye: Secondary | ICD-10-CM | POA: Diagnosis not present

## 2022-11-25 DIAGNOSIS — H524 Presbyopia: Secondary | ICD-10-CM | POA: Diagnosis not present

## 2022-11-25 DIAGNOSIS — H5203 Hypermetropia, bilateral: Secondary | ICD-10-CM | POA: Diagnosis not present

## 2022-11-29 ENCOUNTER — Ambulatory Visit: Payer: 59 | Admitting: Dermatology

## 2022-11-29 DIAGNOSIS — Z79899 Other long term (current) drug therapy: Secondary | ICD-10-CM | POA: Diagnosis not present

## 2022-11-29 DIAGNOSIS — L409 Psoriasis, unspecified: Secondary | ICD-10-CM

## 2022-11-29 DIAGNOSIS — Z7189 Other specified counseling: Secondary | ICD-10-CM

## 2022-11-29 MED ORDER — COSENTYX SENSOREADY (300 MG) 150 MG/ML ~~LOC~~ SOAJ: SUBCUTANEOUS | 6 refills | Status: DC

## 2022-11-29 NOTE — Progress Notes (Signed)
Follow-Up Visit   Subjective  Caleb [name] is a 67 y.o. male who presents for the following: Psoriasis 6 month follow up, patient reports a recent flare at right eyebrow but has cleared  Patient states he is doing well on methotrexate, cosentyx, zoryve, fluocinonide oil   Patient reports recent break out of shingles on left upper back , left shoulder, and chest   The following portions of the chart were reviewed this encounter and updated as appropriate: medications, allergies, medical history  Review of Systems:  No other skin or systemic complaints except as noted in HPI or Assessment and Plan.  Objective  Well appearing patient in no apparent distress; mood and affect are within normal limits.  Areas Examined: Face, arms   Relevant exam findings are noted in the Assessment and Plan.      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 Currently well-controlled. On Cosentyx and MTX   Counseling and coordination of care for severe psoriasis on systemic treatment  Psoriasis - severe on systemic treatment.  Psoriasis is a chronic non-curable, but treatable genetic/hereditary disease that may have other systemic features affecting other organ systems such as joints (Psoriatic Arthritis).  It is linked with heart disease, inflammatory bowel disease, non-alcoholic fatty liver disease, and depression. Significant skin psoriasis and/or psoriatic arthritis may have significant symptoms and affects activities of daily activity and often benefits from systemic treatments.  These systemic treatments have some potential side effects including immunosuppression and require pre-treatment laboratory screening and periodic laboratory monitoring and periodic in person evaluation and monitoring by the  attending dermatologist physician (long term medication management).   Patient has joint pain seen by rheumatologist who prescribes methotrexate- joint pain is better on Cosentyx and methotrexate combo.  Still has hip pain.  Treatment Plan: Last TB screen 5/24 Continue MTX 2.5mg  6 po qwk from rheumatologist for arthritis Recommend follow up with orthopedic surgeon about hip pain Continue Cosentyx injections 300mg  q 4 wks.  patient is self injecting at home.  Continue Zoryve Cream Apply to aas ears and eyebrows qd  Continue fluocinolone solution Apply 2 gtts to ears and eyebrows BID prn itchy rash until improved. Continue clobetasol/CeraVe mix 1-2 times daily prn for less severe areas. Avoid applying to face, groin, and axilla. Use as directed. Long-term use can cause thinning of the skin. Continue clobetasol cream to more severe areas 1-2 times daily prn. Avoid applying to face, groin, and axilla. Use as directed. Long-term use can cause thinning of the skin.  Reviewed risks of biologics including immunosuppression, infections, injection site reaction, and failure to improve condition. Goal is control of skin condition, not cure.  Some older biologics such as Humira and Enbrel may slightly increase risk of malignancy and may worsen congestive heart failure.  Taltz and Cosentyx may cause inflammatory bowel disease to flare. The use of biologics requires long term medication management, including periodic office visits and monitoring of blood work.   Long term medication management.  Patient is using long term (months to years) prescription medication  to control their dermatologic condition.  These medications require periodic monitoring to evaluate for efficacy and side effects and may require periodic laboratory monitoring.   Return in about 6 months (around 05/29/2023) for psoriasis.  I, Asher Muir,  CMA, am acting as scribe for Willeen Niece, MD.   Documentation: I have reviewed the above  documentation for accuracy and completeness, and I agree with the above.  Willeen Niece, MD

## 2022-11-29 NOTE — Patient Instructions (Addendum)
Next time seen by primary have them add TB Gold lab test on with other labs     Due to recent changes in healthcare laws, you may see results of your pathology and/or laboratory studies on MyChart before the doctors have had a chance to review them. We understand that in some cases there may be results that are confusing or concerning to you. Please understand that not all results are received at the same time and often the doctors may need to interpret multiple results in order to provide you with the best plan of care or course of treatment. Therefore, we ask that you please give [place] 2 business days to thoroughly review all your results before contacting the office for clarification. Should we see a critical lab result, you will be contacted sooner.   If You Need Anything After Your Visit  If you have any questions or concerns for your doctor, please call our main line at [phone] and press option 4 to reach your doctor's medical assistant. If no one answers, please leave a voicemail as directed and we will return your call as soon as possible. Messages left after 4 pm will be answered the following business day.   You may also send [place] a message via MyChart. We typically respond to MyChart messages within 1-2 business days.  For prescription refills, please ask your pharmacy to contact our office. Our fax number is [phone].  If you have an urgent issue when the clinic is closed that cannot wait until the next business day, you can page your doctor at the number below.    Please note that while we do our best to be available for urgent issues outside of office hours, we are not available 24/7.   If you have an urgent issue and are unable to reach [place], you may choose to seek medical care at your doctor's office, retail clinic, urgent care center, or emergency room.  If you have a medical emergency, please immediately call 911 or go to the emergency department.  Pager Numbers  - Dr.  Gwen Pounds: [phone]  - Dr. Roseanne Reno: [phone]  - Dr. Katrinka Blazing: [phone]   In the event of inclement weather, please call our main line at [phone] for an update on the status of any delays or closures.  Dermatology Medication Tips: Please keep the boxes that topical medications come in in order to help keep track of the instructions about where and how to use these. Pharmacies typically print the medication instructions only on the boxes and not directly on the medication tubes.   If your medication is too expensive, please contact our office at [phone] option 4 or send [place] a message through MyChart.   We are unable to tell what your co-pay for medications will be in advance as this is different depending on your insurance coverage. However, we may be able to find a substitute medication at lower cost or fill out paperwork to get insurance to cover a needed medication.   If a prior authorization is required to get your medication covered by your insurance company, please allow [place] 1-2 business days to complete this process.  Drug prices often vary depending on where the prescription is filled and some pharmacies may offer cheaper prices.  The website www.goodrx.com contains coupons for medications through different pharmacies. The prices here do not account for what the cost may be with help from insurance (it may be cheaper with your insurance), but the website can give you  the price if you did not use any insurance.  - You can print the associated coupon and take it with your prescription to the pharmacy.  - You may also stop by our office during regular business hours and pick up a GoodRx coupon card.  - If you need your prescription sent electronically to a different pharmacy, notify our office through [facility] or by phone at [phone] option 4.     Si Usted Necesita Algo Despus de Su Visita  Tambin puede enviarnos un mensaje a travs de Clinical cytogeneticist. Por lo  general respondemos a los mensajes de MyChart en el transcurso de 1 a 2 das hbiles.  Para renovar recetas, por favor pida a su farmacia que se ponga en contacto con nuestra oficina. Annie Sable de fax es [place] [phone].  Si tiene un asunto urgente cuando la clnica est cerrada y que no puede esperar hasta el siguiente da hbil, puede llamar/localizar a su doctor(a) al nmero que aparece a continuacin.   Por favor, tenga en cuenta que aunque hacemos todo lo posible para estar disponibles para asuntos urgentes fuera del horario de [place], no estamos disponibles las 24 horas del da, los 7 [address]  [address] de la [place].   Si tiene un problema urgente y no puede comunicarse con nosotros, puede optar por buscar atencin mdica  en el consultorio de su doctor(a), en una clnica privada, en un centro de atencin urgente o en una sala de emergencias.  Si tiene Engineer, drilling, por favor llame inmediatamente al 911 o vaya a la sala de emergencias.  Nmeros de bper  - Dr. Gwen Pounds: [phone]  - Dra. Roseanne Reno: [phone]  - Dr. Katrinka Blazing: [phone]   En caso de inclemencias del tiempo, por favor llame a Lacy Duverney principal al [phone] para una actualizacin sobre el [place] de cualquier retraso o cierre.  Consejos para la medicacin en dermatologa: Por favor, guarde las cajas en las que vienen los medicamentos de uso tpico para ayudarle a seguir las instrucciones sobre dnde y cmo usarlos. Las farmacias generalmente imprimen las instrucciones del medicamento slo en las cajas y no directamente en los tubos del [place].   Si su medicamento es muy caro, por favor, pngase en contacto con Rolm Gala llamando al [phone] y presione la opcin 4 o envenos un mensaje a travs de Clinical cytogeneticist.   No podemos decirle cul ser su copago por los medicamentos por adelantado ya que esto es diferente dependiendo de la cobertura de su seguro. Sin embargo, es posible que podamos encontrar  un medicamento sustituto a Audiological scientist un formulario para que el seguro cubra el medicamento que se considera necesario.   Si se requiere una autorizacin previa para que su compaa de seguros [place] su medicamento, por favor permtanos de 1 a 2 das hbiles para completar [address]  Los precios de los medicamentos varan con frecuencia dependiendo del Environmental consultant de dnde se surte la receta y alguna farmacias pueden ofrecer precios ms baratos.  El sitio web www.goodrx.com tiene cupones para medicamentos de Health and safety inspector. Los precios aqu no tienen en cuenta lo que podra costar con la ayuda del seguro (puede ser ms barato con su seguro), pero el sitio web puede darle el precio si no utiliz Tourist information centre manager.  - Puede imprimir el cupn correspondiente y llevarlo con su receta a la farmacia.  - Tambin puede pasar por nuestra oficina durante el horario de atencin regular y Education officer, museum una tarjeta de cupones de GoodRx.  - Si  necesita que su receta se enve electrnicamente a una farmacia diferente, informe a nuestra oficina a travs de MyChart de [place] o por telfono llamando al [phone] y presione la opcin 4.

## 2022-12-09 ENCOUNTER — Ambulatory Visit (HOSPITAL_BASED_OUTPATIENT_CLINIC_OR_DEPARTMENT_OTHER): Payer: 59 | Admitting: Pain Medicine

## 2022-12-09 DIAGNOSIS — Z09 Encounter for follow-up examination after completed treatment for conditions other than malignant neoplasm: Secondary | ICD-10-CM

## 2022-12-09 NOTE — Progress Notes (Signed)
(  12/09/2022) NO-SHOW to postprocedure evaluation after a right superficial peroneal nerve block on 11/23/2022.

## 2022-12-10 ENCOUNTER — Other Ambulatory Visit: Payer: Self-pay | Admitting: Cardiovascular Disease

## 2022-12-13 NOTE — Telephone Encounter (Signed)
last visit: 08/2022 with plan to f/u in 3 months.  next visit:  12/24/22

## 2022-12-14 ENCOUNTER — Other Ambulatory Visit: Payer: Self-pay | Admitting: Family Medicine

## 2022-12-14 ENCOUNTER — Telehealth: Payer: Self-pay | Admitting: Family Medicine

## 2022-12-14 DIAGNOSIS — S8411XS Injury of peroneal nerve at lower leg level, right leg, sequela: Secondary | ICD-10-CM

## 2022-12-14 DIAGNOSIS — M792 Neuralgia and neuritis, unspecified: Secondary | ICD-10-CM

## 2022-12-14 DIAGNOSIS — G894 Chronic pain syndrome: Secondary | ICD-10-CM

## 2022-12-14 NOTE — Telephone Encounter (Signed)
Medication Refill -  Most Recent Primary Care Visit:  Provider: Erasmo Downer  Department: [facility]  Visit Type: OFFICE VISIT  Date: 10/21/2022  Medication:  pregabalin (LYRICA) 200 MG capsule  *Patient only has 1 left for tonight  Has the patient contacted their pharmacy? Yes, patient states pharmacy told him that he just picked up his medication in November and to contact his PCP office.  Is this the correct pharmacy for this prescription? Yes, the one listed below.  This is the patient's preferred pharmacy:  [id number] [place], [place] - [address] AT [facility] OF [address] Phone: [phone] Fax: [phone]   Has the prescription been filled recently?  Receipt confirmed by pharmacy (08/16/2022 10:02 AM EDT)  Is the patient out of the medication?  Patient only has enough for tonight.  Has the patient been seen for an appointment in the last year OR does the patient have an upcoming appointment? Patient has a CPE scheduled for 4.28.25

## 2022-12-14 NOTE — Telephone Encounter (Signed)
Requested medication (s) are due for refill today: Yes  Requested medication (s) are on the active medication list: Yes  Last refill:  09/23/22  Future visit scheduled: Yes  Notes to clinic:  Unable to refill per protocol, cannot delegate. Patient called today and says in a different encounter will be out after tonight.     Requested Prescriptions  Pending Prescriptions Disp Refills   pregabalin (LYRICA) 200 MG capsule [Pharmacy Med Name: PREGABALIN 200MG  CAPSULES] 90 capsule     Sig: TAKE 1 CAPSULE BY MOUTH EVERY MORNING, EVERY AT NOON, AND AT BEDTIME     Not Delegated - Neurology:  Anticonvulsants - Controlled - pregabalin Failed - 12/14/2022  2:39 PM      Failed - This refill cannot be delegated      Passed - Cr in normal range and within 360 days    Creat  Date Value Ref Range Status  10/21/2016 0.98 0.70 - 1.33 mg/dL Final    Comment:    For patients >76 years of age, the reference limit for Creatinine is approximately 13% higher for people identified as African-American. .    Creatinine, Ser  Date Value Ref Range Status  10/21/2022 0.76 0.76 - 1.27 mg/dL Final         Passed - Completed PHQ-2 or PHQ-9 in the last 360 days      Passed - Valid encounter within last 12 months    Recent Outpatient Visits           1 month ago Essential hypertension   [facility] [place], Marzella Schlein, MD   2 months ago Post herpetic neuralgia   [facility] Erasmo Downer, MD   3 months ago Herpes zoster without complication   [facility] [place], Marzella Schlein, MD   6 months ago Chest pain, unspecified type   [facility], Marzella Schlein, MD   8 months ago Encounter for annual physical exam   [facility] [place], Marzella Schlein, MD       Future Appointments             In 1 week Brion Aliment, Dois [place], NP [facility] at  [place]   In 4 months Bacigalupo, Marzella Schlein, MD [facility]   In 5 months Willeen Niece, MD [facility]   In 11 months McGowan, Elana Alm [facility] [place]

## 2022-12-14 NOTE — Telephone Encounter (Signed)
Requested by pharmacy today in a separate refill encounter, routed to the office.

## 2022-12-21 ENCOUNTER — Ambulatory Visit (INDEPENDENT_AMBULATORY_CARE_PROVIDER_SITE_OTHER): Payer: 59 | Admitting: Family Medicine

## 2022-12-21 ENCOUNTER — Encounter: Payer: Self-pay | Admitting: Family Medicine

## 2022-12-21 ENCOUNTER — Telehealth: Payer: Self-pay | Admitting: Family Medicine

## 2022-12-21 VITALS — BP 131/78 | HR 74 | Ht 68.0 in | Wt 200.1 lb

## 2022-12-21 DIAGNOSIS — Z720 Tobacco use: Secondary | ICD-10-CM

## 2022-12-21 DIAGNOSIS — F331 Major depressive disorder, recurrent, moderate: Secondary | ICD-10-CM

## 2022-12-21 DIAGNOSIS — L4050 Arthropathic psoriasis, unspecified: Secondary | ICD-10-CM | POA: Diagnosis not present

## 2022-12-21 DIAGNOSIS — F1120 Opioid dependence, uncomplicated: Secondary | ICD-10-CM

## 2022-12-21 MED ORDER — BUPROPION HCL ER (XL) 150 MG PO TB24: 150.0000 mg | ORAL_TABLET | Freq: Every day | ORAL | 3 refills | Status: DC

## 2022-12-21 NOTE — Telephone Encounter (Signed)
Pt is calling in to let Dr. B know that the medication that was missing from his med list is Sertrizine 10 MG

## 2022-12-21 NOTE — Progress Notes (Signed)
Established patient visit   Patient: Caleb [name]   DOB: 02-Nov-1955   67 y.o. Male  MRN: [id number] Visit Date: 12/21/2022  Today's healthcare provider: Shirlee Latch, MD   Chief Complaint  Patient presents with   Depression    Seasonal depression. Patient reports it is worse this year, states he sat at home this weekend all day and cried and does not really care if he lives or not. Patient would like to see about taking something.    Subjective    Depression        HPI     Depression    Additional comments: Seasonal depression. Patient reports it is worse this year, states he sat at home this weekend all day and cried and does not really care if he lives or not. Patient would like to see about taking something.         Comments   Declined tobacco counseling lung cancer screening - yes       Last edited by Acey Lav, CMA on 12/21/2022  1:21 PM.       Discussed the use of AI scribe software for clinical note transcription with the patient, who gave verbal consent to proceed.  History of Present Illness   The patient, with a history of seasonal depression, presents with worsening depressive symptoms. They report that this is a recurring issue, with symptoms intensifying annually. The patient describes feelings of apathy, stating they "don't care if they don't live another day," although they clarify they have no intention of self-harm. They also express feelings of loneliness and monotony, as they live alone and follow a repetitive daily routine.  The patient also mentions financial stressors, including unexpected vehicle repair costs, which have added to their emotional burden. They express guilt over borrowing money from their employer to cover these costs.  In addition to their mental health concerns, the patient expresses a desire to quit smoking and reduce alcohol consumption, both of which they acknowledge are unhealthy habits. They are currently  on nortriptyline for pain management, and there is some uncertainty about whether they are still taking sertraline for depression.         Medications: Outpatient Medications Prior to Visit  Medication Sig   albuterol (VENTOLIN HFA) 108 (90 Base) MCG/ACT inhaler Inhale 2 puffs into the lungs every 6 (six) hours as needed for wheezing or shortness of breath.   amLODipine (NORVASC) 2.5 MG tablet Take 1 tablet (2.5 mg total) by mouth daily.   aspirin EC 81 MG tablet Take 81 mg by mouth daily.    cetirizine (ZYRTEC) 10 MG tablet Take 1 tablet (10 mg total) by mouth daily.   COSENTYX SENSOREADY, 300 MG, 150 MG/ML SOAJ INJECT 300 MG (2 PENS) SUBCUTANEOUSLY EVERY 4 WEEKS FOR MAINTENANCE   fluticasone (FLONASE) 50 MCG/ACT nasal spray Place 2 sprays into both nostrils daily.   hydrochlorothiazide (HYDRODIURIL) 25 MG tablet TAKE 1 TABLET BY MOUTH DAILY   ibuprofen (ADVIL) 800 MG tablet Take 1 tablet (800 mg total) by mouth every 6 (six) hours as needed.   isosorbide mononitrate (IMDUR) 30 MG 24 hr tablet Take 1 tablet (30 mg total) by mouth daily.   lidocaine (XYLOCAINE) 5 % ointment Apply 1 Application topically as needed.   losartan (COZAAR) 50 MG tablet TAKE 1 TABLET(50 MG) BY MOUTH DAILY   Magnesium Hydroxide (MAGNESIA PO) Take 1 tablet by mouth 3 (three) times daily.   Melatonin 10 MG CAPS Take 20  mg by mouth at bedtime.   methocarbamol (ROBAXIN) 750 MG tablet TAKE 1 TABLET (750 MG TOTAL) BY MOUTH EVERY 8 (EIGHT) HOURS AS NEEDED FOR MUSCLE SPASMS.   methotrexate (RHEUMATREX) 2.5 MG tablet Take 15 mg by mouth once a week.   montelukast (SINGULAIR) 10 MG tablet Take 1 tablet (10 mg total) by mouth at bedtime.   naloxone (NARCAN) nasal spray 4 mg/0.1 mL Use in case of opioid overdose   nitroGLYCERIN (NITROSTAT) 0.4 MG SL tablet Place 1 tablet (0.4 mg total) under the tongue every 5 (five) minutes as needed for chest pain.   nortriptyline (PAMELOR) 25 MG capsule Take 1 capsule (25 mg total) by  mouth at bedtime.   pregabalin (LYRICA) 200 MG capsule TAKE 1 CAPSULE BY MOUTH EVERY MORNING, EVERY AT NOON, AND AT BEDTIME   rosuvastatin (CRESTOR) 40 MG tablet Take 1 tablet (40 mg total) by mouth daily.   sildenafil (VIAGRA) 100 MG tablet Take 1 tablet (100 mg total) by mouth daily as needed for erectile dysfunction (take 30 minutes prior to sexual activity on an empty stomach).   triamcinolone ointment (KENALOG) 0.5 % Apply topically.   TURMERIC PO Take 1 capsule by mouth daily.   umeclidinium-vilanterol (ANORO ELLIPTA) 62.5-25 MCG/ACT AEPB Inhale 1 puff into the lungs daily.   No facility-administered medications prior to visit.    Review of Systems  Psychiatric/Behavioral:  Positive for depression.        Objective    BP 131/78 (BP Location: Left Arm, Patient Position: Sitting, Cuff Size: Large)   Pulse 74   Ht 5\' 8"  (1.727 m)   Wt 200 lb 1.6 oz (90.8 kg)   SpO2 99%   BMI 30.43 kg/m    Physical Exam Vitals reviewed.  Constitutional:      General: He is not in acute distress.    Appearance: Normal appearance. He is not diaphoretic.  HENT:     Head: Normocephalic and atraumatic.  Eyes:     General: No scleral icterus.    Conjunctiva/sclera: Conjunctivae normal.  Cardiovascular:     Rate and Rhythm: Normal rate and regular rhythm.     Heart sounds: Normal heart sounds. No murmur heard. Pulmonary:     Effort: Pulmonary effort is normal. No respiratory distress.     Breath sounds: Normal breath sounds. No wheezing or rhonchi.  Musculoskeletal:     Cervical back: Neck supple.     Right lower leg: No edema.     Left lower leg: No edema.  Lymphadenopathy:     Cervical: No cervical adenopathy.  Skin:    General: Skin is warm and dry.     Findings: No rash.  Neurological:     Mental Status: He is alert and oriented to person, place, and time. Mental status is at baseline.  Psychiatric:        Mood and Affect: Mood normal.        Behavior: Behavior normal.       No results found for any visits on 12/21/22.  Assessment & Plan     Problem List Items Addressed This Visit       Musculoskeletal and Integument   Psoriatic arthritis (HCC) (Chronic)   F/b Derm On biologic        Other   Uncomplicated opioid dependence (HCC) (Chronic)   F/b pain management      Tobacco abuse   Relevant Orders   Ambulatory Referral for Lung Cancer Scre   Moderate episode of recurrent  major depressive disorder (HCC) - Primary   Recurrent seasonal depression with significant depressive symptoms including apathy and hopelessness. No current suicidal ideation but expresses a lack of desire to live. Previously on sertraline but discontinued due to side effects. Currently on nortriptyline for pain. Discussed Wellbutrin for mood improvement and smoking cessation, potential side effects (dry mouth, suppressed appetite), and morning dosing to avoid insomnia. Prefers to start Wellbutrin. - Prescribe Wellbutrin XL 150 mg daily - Advise morning dosing to avoid insomnia - Follow up in April for physical, sooner if needed      Relevant Medications   buPROPion (WELLBUTRIN XL) 150 MG 24 hr tablet        Chronic Obstructive Pulmonary Disease (COPD) Known COPD. Smoker advised on smoking cessation benefits. Discussed Wellbutrin's role in reducing smoking cravings and mood improvement. - Discuss Wellbutrin for smoking cessation - Encourage smoking cessation - Order lung cancer screening  Alcohol Use Regular alcohol consumption, including beer on days off and nightly drinks. Acknowledges health risks and desires to stop. Discussed benefits of reducing alcohol intake on mood and energy levels. - Encourage reduction or cessation of alcohol consumption - Discuss benefits of reducing alcohol intake on mood and energy levels  Chronic Pain Chronic pain managed with nortriptyline and other medications. Reports taking multiple medications daily for pain management. - Continue  current pain management regimen - Monitor for side effects or issues with current medications  General Health Maintenance On Medicare with a physical scheduled in April. Needs TB testing as requested by dermatologist. - Perform TB test during April physical - Conduct Welcome to Medicare visit during April physical  Follow-up - Follow up in April for physical - Call or message if issues with medications or further follow-up needed before April.        Return in about 4 months (around 04/21/2023) for CPE, as scheduled.       Shirlee Latch, MD  [facility] [phone] (phone) [phone] (fax)  [facility]

## 2022-12-21 NOTE — Assessment & Plan Note (Signed)
Recurrent seasonal depression with significant depressive symptoms including apathy and hopelessness. No current suicidal ideation but expresses a lack of desire to live. Previously on sertraline but discontinued due to side effects. Currently on nortriptyline for pain. Discussed Wellbutrin for mood improvement and smoking cessation, potential side effects (dry mouth, suppressed appetite), and morning dosing to avoid insomnia. Prefers to start Wellbutrin. - Prescribe Wellbutrin XL 150 mg daily - Advise morning dosing to avoid insomnia - Follow up in April for physical, sooner if needed

## 2022-12-21 NOTE — Assessment & Plan Note (Signed)
F/b pain management

## 2022-12-21 NOTE — Assessment & Plan Note (Signed)
F/b Derm On biologic

## 2022-12-23 DIAGNOSIS — M4722 Other spondylosis with radiculopathy, cervical region: Secondary | ICD-10-CM | POA: Diagnosis not present

## 2022-12-23 DIAGNOSIS — R030 Elevated blood-pressure reading, without diagnosis of hypertension: Secondary | ICD-10-CM | POA: Diagnosis not present

## 2022-12-23 DIAGNOSIS — F17200 Nicotine dependence, unspecified, uncomplicated: Secondary | ICD-10-CM | POA: Diagnosis not present

## 2022-12-23 DIAGNOSIS — M79671 Pain in right foot: Secondary | ICD-10-CM | POA: Diagnosis not present

## 2022-12-23 DIAGNOSIS — Z79899 Other long term (current) drug therapy: Secondary | ICD-10-CM | POA: Diagnosis not present

## 2022-12-23 DIAGNOSIS — G8929 Other chronic pain: Secondary | ICD-10-CM | POA: Diagnosis not present

## 2022-12-23 DIAGNOSIS — I10 Essential (primary) hypertension: Secondary | ICD-10-CM | POA: Diagnosis not present

## 2022-12-23 DIAGNOSIS — Z9181 History of falling: Secondary | ICD-10-CM | POA: Diagnosis not present

## 2022-12-23 NOTE — Telephone Encounter (Signed)
I called patient to clarify if he means Cetirizine 10mg  (Zyrtec). He says he is not sure but he takes it for depression and that Dr. B was thinking about increasing it.

## 2022-12-24 ENCOUNTER — Ambulatory Visit: Payer: Medicaid Other | Admitting: Nurse Practitioner

## 2022-12-24 NOTE — Telephone Encounter (Signed)
He is taking cetirizine 10 mg daily.  This is for allergies.  I had asked him about sertraline as I had marked that he was no longer taking this that we had stopped it but he thought he was still taking it.  The name is similar to cetirizine so I asked him to confirm.  He was most recently prescribed 50 mg daily but had been prescribed 100 mg daily before that.  Can someone tried to discussed with patient about what he is actually taking or he may need to talk to the pharmacy to see if he still having sertraline filled?

## 2023-01-20 ENCOUNTER — Other Ambulatory Visit: Payer: Self-pay | Admitting: Family Medicine

## 2023-01-20 DIAGNOSIS — G894 Chronic pain syndrome: Secondary | ICD-10-CM

## 2023-01-20 DIAGNOSIS — M792 Neuralgia and neuritis, unspecified: Secondary | ICD-10-CM

## 2023-01-20 DIAGNOSIS — S8411XS Injury of peroneal nerve at lower leg level, right leg, sequela: Secondary | ICD-10-CM

## 2023-01-21 ENCOUNTER — Encounter: Payer: Self-pay | Admitting: Nurse Practitioner

## 2023-01-21 ENCOUNTER — Ambulatory Visit: Payer: 59 | Attending: Nurse Practitioner | Admitting: Nurse Practitioner

## 2023-01-21 ENCOUNTER — Ambulatory Visit: Payer: Self-pay

## 2023-01-21 VITALS — BP 110/70 | HR 76 | Ht 68.0 in | Wt 202.0 lb

## 2023-01-21 DIAGNOSIS — Z789 Other specified health status: Secondary | ICD-10-CM

## 2023-01-21 DIAGNOSIS — I2510 Atherosclerotic heart disease of native coronary artery without angina pectoris: Secondary | ICD-10-CM

## 2023-01-21 DIAGNOSIS — I255 Ischemic cardiomyopathy: Secondary | ICD-10-CM

## 2023-01-21 DIAGNOSIS — I25119 Atherosclerotic heart disease of native coronary artery with unspecified angina pectoris: Secondary | ICD-10-CM | POA: Diagnosis not present

## 2023-01-21 DIAGNOSIS — Z720 Tobacco use: Secondary | ICD-10-CM

## 2023-01-21 DIAGNOSIS — E785 Hyperlipidemia, unspecified: Secondary | ICD-10-CM | POA: Diagnosis not present

## 2023-01-21 DIAGNOSIS — I5022 Chronic systolic (congestive) heart failure: Secondary | ICD-10-CM

## 2023-01-21 DIAGNOSIS — G894 Chronic pain syndrome: Secondary | ICD-10-CM

## 2023-01-21 DIAGNOSIS — M792 Neuralgia and neuritis, unspecified: Secondary | ICD-10-CM

## 2023-01-21 DIAGNOSIS — S8411XS Injury of peroneal nerve at lower leg level, right leg, sequela: Secondary | ICD-10-CM

## 2023-01-21 NOTE — Patient Instructions (Signed)
Medication Instructions:  No changes *If you need a refill on your cardiac medications before your next appointment, please call your pharmacy*   Lab Work: None ordered If you have labs (blood work) drawn today and your tests are completely normal, you will receive your results only by: MyChart Message (if you have MyChart) OR A paper copy in the mail If you have any lab test that is abnormal or we need to change your treatment, we will call you to review the results.   Testing/Procedures: None ordered   Follow-Up: At [facility], you and your health needs are our priority.  As part of our continuing mission to provide you with exceptional heart care, we have created designated Provider Care Teams.  These Care Teams include your primary Cardiologist (physician) and Advanced Practice Providers (APPs -  Physician Assistants and Nurse Practitioners) who all work together to provide you with the care you need, when you need it.  We recommend signing up for the patient portal called "MyChart".  Sign up information is provided on this After Visit Summary.  MyChart is used to connect with patients for Virtual Visits (Telemedicine).  Patients are able to view lab/test results, encounter notes, upcoming appointments, etc.  Non-urgent messages can be sent to your provider as well.   To learn more about what you can do with MyChart, go to [email or web address].    Your next appointment:   3 month(s)  Provider:   You may see Julien Nordmann, MD or one of the following Advanced Practice Providers on your designated Care Team:   Nicolasa Ducking, NP Eula Listen, PA-C Cadence Fransico Michael, PA-C Charlsie Quest, NP Carlos Levering, NP

## 2023-01-21 NOTE — Telephone Encounter (Signed)
Requested medication (s) are due for refill today- no  Requested medication (s) are on the active medication list -yes  Future visit scheduled -yes  Last refill: 12/15/22 #90 1RF  Notes to clinic: non delegated Rx, too soon  Requested Prescriptions  Pending Prescriptions Disp Refills   pregabalin (LYRICA) 200 MG capsule [Pharmacy Med Name: PREGABALIN 200MG  CAPSULES] 90 capsule     Sig: TAKE 1 CAPSULE BY MOUTH EVERY MORNING, 1 CAPSULE AT NOON AND 1 CAPSULE AT BEDTIME     Not Delegated - Neurology:  Anticonvulsants - Controlled - pregabalin Failed - 01/21/2023  9:22 AM      Failed - This refill cannot be delegated      Passed - Cr in normal range and within 360 days    Creat  Date Value Ref Range Status  10/21/2016 0.98 0.70 - 1.33 mg/dL Final    Comment:    For patients >65 years of age, the reference limit for Creatinine is approximately 13% higher for people identified as African-American. .    Creatinine, Ser  Date Value Ref Range Status  10/21/2022 0.76 0.76 - 1.27 mg/dL Final         Passed - Completed PHQ-2 or PHQ-9 in the last 360 days      Passed - Valid encounter within last 12 months    Recent Outpatient Visits           1 month ago Moderate episode of recurrent major depressive disorder [facility])   [facility] [place], Marzella Schlein, MD   3 months ago Essential hypertension   [place] [place], Marzella Schlein, MD   4 months ago Post herpetic neuralgia   [facility] [place], Marzella Schlein, MD   4 months ago Herpes zoster without complication   [place] [facility] [place], Marzella Schlein, MD   7 months ago Chest pain, unspecified type   [facility], Marzella Schlein, MD       Future Appointments             In 3 months Bacigalupo, Marzella Schlein, MD [facility], [facility]   In 4 months Willeen Niece, MD [facility]  [facility]   In 10 months McGowan, Elana Alm [facility] [place]               Requested Prescriptions  Pending Prescriptions Disp Refills   pregabalin (LYRICA) 200 MG capsule [Pharmacy Med Name: PREGABALIN 200MG  CAPSULES] 90 capsule     Sig: TAKE 1 CAPSULE BY MOUTH EVERY MORNING, 1 CAPSULE AT NOON AND 1 CAPSULE AT BEDTIME     Not Delegated - Neurology:  Anticonvulsants - Controlled - pregabalin Failed - 01/21/2023  9:22 AM      Failed - This refill cannot be delegated      Passed - Cr in normal range and within 360 days    Creat  Date Value Ref Range Status  10/21/2016 0.98 0.70 - 1.33 mg/dL Final    Comment:    For patients >30 years of age, the reference limit for Creatinine is approximately 13% higher for people identified as African-American. .    Creatinine, Ser  Date Value Ref Range Status  10/21/2022 0.76 0.76 - 1.27 mg/dL Final         Passed - Completed PHQ-2 or PHQ-9 in the last 360 days      Passed - Valid encounter  within last 12 months    Recent Outpatient Visits           1 month ago Moderate episode of recurrent major depressive disorder [facility])   [place] [place], Marzella Schlein, MD   3 months ago Essential hypertension   [place] [facility] [place], Marzella Schlein, MD   4 months ago Post herpetic neuralgia   [place] Erasmo Downer, MD   4 months ago Herpes zoster without complication   [facility] [place], Marzella Schlein, MD   7 months ago Chest pain, unspecified type   [facility], Marzella Schlein, MD       Future Appointments             In 3 months Bacigalupo, Marzella Schlein, MD [facility], [facility]   In 4 months Willeen Niece, MD [facility]   In 10 months McGowan, Elana Al[facility] [place]

## 2023-01-21 NOTE — Telephone Encounter (Signed)
Patient called, left VM to return the call to the office to speak to the NT.   Summary: rx concern   The patient has called to request contact with a member of staff when possible  The patient has misplaced their prescription of pregabalin (LYRICA) 200 MG capsule [[id number]] and would like to know what they should do  The patient is concerned with going without the medication due to their foot discomfort  Please contact the patient when possible

## 2023-01-21 NOTE — Telephone Encounter (Signed)
  Chief Complaint: medication assistance Symptoms: NA Frequency:  Pertinent Negatives: NA Disposition: [x] ED /[x] Urgent Care (no appt availability in office) / [] Appointment(In office/virtual)/ []  [place] [facility]/ [] Home Care/ [] Refused Recommended Disposition /[] [place] [facility]/ []  Follow-up with PCP Additional Notes: pt states he has lost bottle of Lyrica 200 MG he just picked up on 01/14/23. Pt has no idea where he put them but has searched and unable to find them. Pt has talked with Pharmacy and [employer]. [employer] told him and pharmacy if Dr. B sends in replacement fill they will reject but override for approval. I called on call provider Dr. Caralee Ates at [phone] to see if any assistance, she states that it being a controlled substance and it being after hours and not PCP she was unable to assist. I advised pt of this and recommended he can go to [facility] or ED for refill. Also reached out to Marguerite Olea, NP with [facility] to see if pt could do that as last resort but she advised same thing it being controlled substance they don't prescribe. I recommended pt try UC first since less copay but may reject and have to go to ED. Pt states he will go to UC first thing in morning since he is unable to go without medication. Will send to office to FU with pt on Monday.     Reason for Disposition  [1] Caller has URGENT medicine question about med that PCP or specialist prescribed AND [2] triager unable to answer question  Answer Assessment - Initial Assessment Questions 1. NAME of MEDICINE: "What medicine(s) are you calling about?"     Lyrica 200 MG 2. QUESTION: "What is your question?" (e.g., double dose of medicine, side effect)     Pt has lost bottle he just picked up on 01/14/23 3. PRESCRIBER: "Who prescribed the medicine?" Reason: if prescribed by specialist, call should be referred to that group.     Dr. Leonard Schwartz  4. SYMPTOMS: "Do you have any symptoms?" If Yes, ask: "What symptoms are you having?"  "How bad are the  symptoms (e.g., mild, moderate, severe)     Has chronic nerve pain  Protocols used: Medication Question Call-A-AH
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
## 2023-01-21 NOTE — Telephone Encounter (Signed)
Pt called and spoke to agent.He is out of this medication and is asking for refill to be rushed.

## 2023-01-21 NOTE — Telephone Encounter (Signed)
Pt is calling to report that he is out of medication. Pharmacy states that he picked up the medication on 01/14/2023. Pt reports that he is unable to find the medication. If Dr. B will resend the script. The insurance company will deny the script. And then approve the script. Pt stated that he his unable to get refill tonight that he will go to the emergency room to get a fill. Because he can not go with out his medication. Pt was advised by the agent that medication refills can take up to 3 business days.

## 2023-01-24 DIAGNOSIS — M4722 Other spondylosis with radiculopathy, cervical region: Secondary | ICD-10-CM | POA: Diagnosis not present

## 2023-01-24 DIAGNOSIS — F17200 Nicotine dependence, unspecified, uncomplicated: Secondary | ICD-10-CM | POA: Diagnosis not present

## 2023-01-24 DIAGNOSIS — M79671 Pain in right foot: Secondary | ICD-10-CM | POA: Diagnosis not present

## 2023-01-24 DIAGNOSIS — Z9181 History of falling: Secondary | ICD-10-CM | POA: Diagnosis not present

## 2023-01-24 DIAGNOSIS — I10 Essential (primary) hypertension: Secondary | ICD-10-CM | POA: Diagnosis not present

## 2023-01-24 DIAGNOSIS — F17210 Nicotine dependence, cigarettes, uncomplicated: Secondary | ICD-10-CM | POA: Diagnosis not present

## 2023-01-24 DIAGNOSIS — Z0000 Encounter for general adult medical examination without abnormal findings: Secondary | ICD-10-CM | POA: Diagnosis not present

## 2023-01-24 DIAGNOSIS — Z79899 Other long term (current) drug therapy: Secondary | ICD-10-CM | POA: Diagnosis not present

## 2023-01-24 DIAGNOSIS — G8929 Other chronic pain: Secondary | ICD-10-CM | POA: Diagnosis not present

## 2023-01-24 DIAGNOSIS — R030 Elevated blood-pressure reading, without diagnosis of hypertension: Secondary | ICD-10-CM | POA: Diagnosis not present

## 2023-01-24 MED ORDER — PREGABALIN 200 MG PO CAPS: 200.0000 mg | ORAL_CAPSULE | Freq: Three times a day (TID) | ORAL | 0 refills | Status: DC

## 2023-01-24 NOTE — Telephone Encounter (Signed)
Pt called in he has found the med, pregablin, doesn't need refill anymore

## 2023-01-24 NOTE — Addendum Note (Signed)
Addended by: Erasmo Downer on: 01/24/2023 08:24 AM   Modules accepted: Orders

## 2023-01-24 NOTE — Telephone Encounter (Signed)
Rx early fill this one time. Will not be able to do this in the future. Rx with override request sent to the pharmacy.

## 2023-02-03 ENCOUNTER — Ambulatory Visit: Payer: 59 | Admitting: Podiatry

## 2023-02-08 ENCOUNTER — Encounter: Payer: Self-pay | Admitting: Podiatry

## 2023-02-08 ENCOUNTER — Ambulatory Visit (INDEPENDENT_AMBULATORY_CARE_PROVIDER_SITE_OTHER): Payer: 59 | Admitting: Podiatry

## 2023-02-08 VITALS — Ht 68.0 in | Wt 202.0 lb

## 2023-02-08 DIAGNOSIS — M65971 Unspecified synovitis and tenosynovitis, right ankle and foot: Secondary | ICD-10-CM | POA: Diagnosis not present
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
## 2023-02-11 DIAGNOSIS — L400 Psoriasis vulgaris: Secondary | ICD-10-CM | POA: Diagnosis not present

## 2023-02-11 DIAGNOSIS — M150 Primary generalized (osteo)arthritis: Secondary | ICD-10-CM | POA: Diagnosis not present

## 2023-02-11 DIAGNOSIS — M5030 Other cervical disc degeneration, unspecified cervical region: Secondary | ICD-10-CM | POA: Diagnosis not present

## 2023-02-11 DIAGNOSIS — L4050 Arthropathic psoriasis, unspecified: Secondary | ICD-10-CM | POA: Diagnosis not present

## 2023-02-11 DIAGNOSIS — Z7960 Long term (current) use of unspecified immunomodulators and immunosuppressants: Secondary | ICD-10-CM | POA: Diagnosis not present

## 2023-02-17 ENCOUNTER — Telehealth: Payer: Self-pay

## 2023-02-17 DIAGNOSIS — Z122 Encounter for screening for malignant neoplasm of respiratory organs: Secondary | ICD-10-CM

## 2023-02-17 DIAGNOSIS — F17210 Nicotine dependence, cigarettes, uncomplicated: Secondary | ICD-10-CM

## 2023-02-17 DIAGNOSIS — Z87891 Personal history of nicotine dependence: Secondary | ICD-10-CM

## 2023-02-17 NOTE — Telephone Encounter (Signed)
.  Lung Cancer Screening Narrative/Criteria Questionnaire (Cigarette Smokers Only- No Cigars/Pipes/vapes)   Caleb [name]   SDMV:2/27/205 at 8:45am with Caleb [name]         Oct 14, 1955   LDCT: 03/04/2023 at 10:00am at [facility]    68 y.o.   Phone: [phone]   Lung Screening Narrative (confirm age 55-77 yrs Medicare / 50-80 yrs Private pay insurance)   Insurance information:[employer]   Referring Provider:Bacigalupo, MD   This screening involves an initial phone call with a team member from our program. It is called a shared decision making visit. The initial meeting is required by  insurance and Medicare to make sure you understand the program. This appointment takes about 15-20 minutes to complete. You will complete the screening scan at your scheduled date/time.  This scan takes about 5-10 minutes to complete. You can eat and drink normally before and after the scan.  Criteria questions for Lung Cancer Screening:   Are you a current or former smoker? Current Age began smoking: 12   If you are a former smoker, what year did you quit smoking? N/A(within 15 yrs)   To calculate your smoking history, I need an accurate estimate of how many packs of cigarettes you smoked per day and for how many years. (Not just the number of PPD you are now smoking)   Years smoking 55 x Packs per day 2.5 = Pack years 137.5   (at least 20 pack yrs)   (Make sure they understand that we need to know how much they have smoked in the past, not just the number of PPD they are smoking now)  Do you have a personal history of cancer?  No    Do you have a family history of cancer? Yes  (cancer type and and relative) Sister Hodgkins Lymphoma   Are you coughing up blood?  No  Have you had unexplained weight loss of 15 lbs or more in the last 6 months? No  It looks like you meet all criteria.  When would be a good time for [place] to schedule you for this screening?   Additional information: N/A

## 2023-02-24 DIAGNOSIS — F17210 Nicotine dependence, cigarettes, uncomplicated: Secondary | ICD-10-CM | POA: Diagnosis not present

## 2023-02-24 DIAGNOSIS — I10 Essential (primary) hypertension: Secondary | ICD-10-CM | POA: Diagnosis not present

## 2023-02-24 DIAGNOSIS — F17200 Nicotine dependence, unspecified, uncomplicated: Secondary | ICD-10-CM | POA: Diagnosis not present

## 2023-02-24 DIAGNOSIS — E7800 Pure hypercholesterolemia, unspecified: Secondary | ICD-10-CM | POA: Diagnosis not present

## 2023-02-24 DIAGNOSIS — E039 Hypothyroidism, unspecified: Secondary | ICD-10-CM | POA: Diagnosis not present

## 2023-02-24 DIAGNOSIS — Z79899 Other long term (current) drug therapy: Secondary | ICD-10-CM | POA: Diagnosis not present

## 2023-02-24 DIAGNOSIS — M4722 Other spondylosis with radiculopathy, cervical region: Secondary | ICD-10-CM | POA: Diagnosis not present

## 2023-02-24 DIAGNOSIS — M129 Arthropathy, unspecified: Secondary | ICD-10-CM | POA: Diagnosis not present

## 2023-02-24 DIAGNOSIS — G8929 Other chronic pain: Secondary | ICD-10-CM | POA: Diagnosis not present

## 2023-02-24 DIAGNOSIS — Z9181 History of falling: Secondary | ICD-10-CM | POA: Diagnosis not present

## 2023-02-24 DIAGNOSIS — E559 Vitamin D deficiency, unspecified: Secondary | ICD-10-CM | POA: Diagnosis not present

## 2023-02-24 DIAGNOSIS — M79671 Pain in right foot: Secondary | ICD-10-CM | POA: Diagnosis not present

## 2023-03-03 ENCOUNTER — Ambulatory Visit: Payer: 59 | Admitting: Adult Health

## 2023-03-03 ENCOUNTER — Encounter: Payer: Self-pay | Admitting: Adult Health

## 2023-03-03 DIAGNOSIS — F17210 Nicotine dependence, cigarettes, uncomplicated: Secondary | ICD-10-CM

## 2023-03-03 NOTE — Patient Instructions (Signed)

## 2023-03-03 NOTE — Progress Notes (Signed)
  Virtual Visit via Telephone Note  I connected with Caleb [name] , 03/03/23 8:45 AM by a telemedicine application and verified that I am speaking with the correct person using two identifiers.  Location: Patient: home Provider: home   I discussed the limitations of evaluation and management by telemedicine and the availability of in person appointments. The patient expressed understanding and agreed to proceed.   Shared Decision Making Visit Lung Cancer Screening Program [phone])   Eligibility: 68 y.o. Pack Years Smoking History Calculation = 135 pack years  (# packs/per year x # years smoked) Recent History of coughing up blood  no Unexplained weight loss? no ( >Than 15 pounds within the last 6 months ) Prior History Lung / other cancer no (Diagnosis within the last 5 years already requiring surveillance chest CT Scans). Smoking Status Current Smoker  Visit Components: Discussion included one or more decision making aids. YES Discussion included risk/benefits of screening. YES Discussion included potential follow up diagnostic testing for abnormal scans. YES Discussion included meaning and risk of over diagnosis. YES Discussion included meaning and risk of False Positives. YES Discussion included meaning of total radiation exposure. YES  Counseling Included: Importance of adherence to annual lung cancer LDCT screening. YES Impact of comorbidities on ability to participate in the program. YES Ability and willingness to under diagnostic treatment. YES  Smoking Cessation Counseling: Current Smokers:  Discussed importance of smoking cessation. yes Information about tobacco cessation classes and interventions provided to patient. yes Patient provided with "ticket" for LDCT Scan. yes Symptomatic Patient. NO Diagnosis Code: Tobacco Use Z72.0 Asymptomatic Patient yes  Counseling (Intermediate counseling: > three minutes counseling) [id number] Written Order for Lung Cancer  Screening with LDCT placed in Epic. Yes (CT Chest Lung Cancer Screening Low Dose W/O CM) [id number]  Z12.2-Screening of respiratory organs Z87.891-Personal history of nicotine dependence   Danford Bad 03/03/23

## 2023-03-04 ENCOUNTER — Encounter: Payer: Self-pay | Admitting: Family Medicine

## 2023-03-04 ENCOUNTER — Ambulatory Visit
Admission: RE | Admit: 2023-03-04 | Discharge: 2023-03-04 | Disposition: A | Discharge status: Home / Self Care | Payer: 59 | Source: Ambulatory Visit | Attending: *Deleted | Admitting: *Deleted

## 2023-03-04 DIAGNOSIS — Z87891 Personal history of nicotine dependence: Secondary | ICD-10-CM | POA: Diagnosis not present

## 2023-03-04 DIAGNOSIS — Z122 Encounter for screening for malignant neoplasm of respiratory organs: Secondary | ICD-10-CM | POA: Insufficient documentation

## 2023-03-04 DIAGNOSIS — F17210 Nicotine dependence, cigarettes, uncomplicated: Secondary | ICD-10-CM | POA: Insufficient documentation

## 2023-03-14 ENCOUNTER — Other Ambulatory Visit: Payer: Self-pay

## 2023-03-14 ENCOUNTER — Ambulatory Visit: Payer: Self-pay | Admitting: Family Medicine

## 2023-03-14 ENCOUNTER — Telehealth: Payer: Self-pay | Admitting: Urology

## 2023-03-14 ENCOUNTER — Other Ambulatory Visit: Payer: Self-pay | Admitting: Adult Health

## 2023-03-14 ENCOUNTER — Other Ambulatory Visit: Payer: Self-pay | Admitting: Family Medicine

## 2023-03-14 DIAGNOSIS — G8929 Other chronic pain: Secondary | ICD-10-CM

## 2023-03-14 DIAGNOSIS — N529 Male erectile dysfunction, unspecified: Secondary | ICD-10-CM

## 2023-03-14 MED ORDER — SILDENAFIL CITRATE 100 MG PO TABS: 100.0000 mg | ORAL_TABLET | Freq: Every day | ORAL | 3 refills | Status: DC | PRN

## 2023-03-14 NOTE — Telephone Encounter (Signed)
 RX sent

## 2023-03-14 NOTE — Telephone Encounter (Signed)
 Pt called and needs his sildenafil (VIAGRA) 100 MG tablet  filled to [facility].

## 2023-03-14 NOTE — Telephone Encounter (Signed)
 Pt called in today asking about the results of the CT Chest  he had on 2/28. RN advised pt the results have not been released yet. Pt verbalized understanding. RN advised pt he can call us back any time with any further questions.  Copied from CRM [id number]. Topic: Clinical - Request for Lab/Test Order >> Mar 14, 2023  2:29 PM [place] B wrote: Reason for CRM: pt called in for cancer screen result Reason for Disposition  Health Information question, no triage required and triager able to answer question  Answer Assessment - Initial Assessment Questions 1. REASON FOR CALL or QUESTION: "What is your reason for calling today?" or "How can I best help you?" or "What question do you have that I can help answer?"     Pt calling in asking about the results of the CT CHEST LUNG CA SCREEN performed on 03/04/2023. RN advised pt that those results have not been released yet. Pt verbalized understanding. RN advised pt he can call us back with any other questions in the future.  Protocols used: Information Only Call - No Triage-A-AH

## 2023-03-14 NOTE — Telephone Encounter (Signed)
 ERROR

## 2023-03-15 ENCOUNTER — Other Ambulatory Visit: Payer: Self-pay | Admitting: Family Medicine

## 2023-03-15 DIAGNOSIS — M792 Neuralgia and neuritis, unspecified: Secondary | ICD-10-CM

## 2023-03-15 DIAGNOSIS — G894 Chronic pain syndrome: Secondary | ICD-10-CM

## 2023-03-15 DIAGNOSIS — S8411XS Injury of peroneal nerve at lower leg level, right leg, sequela: Secondary | ICD-10-CM

## 2023-03-15 NOTE — Telephone Encounter (Signed)
 Requested medications are due for refill today.  yes  Requested medications are on the active medications list.  yes  Last refill. 08/09/2022 #90 3 rf  Future visit scheduled.   yes  Notes to clinic.  Refill not delegated.    Requested Prescriptions  Pending Prescriptions Disp Refills   methocarbamol (ROBAXIN) 750 MG tablet [Pharmacy Med Name: METHOCARBAMOL 750 MG TABS 750 Tablet] 90 tablet 10    Sig: TAKE 1 TABLET BY MOUTH THREE TIMES DAILY     Not Delegated - Analgesics:  Muscle Relaxants Failed - 03/15/2023  4:45 PM      Failed - This refill cannot be delegated      Passed - Valid encounter within last 6 months    Recent Outpatient Visits           2 months ago Moderate episode of recurrent major depressive disorder [facility])   [place] [facility], Marzella Schlein, MD   4 months ago Essential hypertension   [place] [facility] [place], Marzella Schlein, MD   5 months ago Post herpetic neuralgia   [place] [facility] [place], Marzella Schlein, MD   6 months ago Herpes zoster without complication   [place] [facility] [place], Marzella Schlein, MD   9 months ago Chest pain, unspecified type   [facility], Marzella Schlein, MD       Future Appointments             In 1 month Gollan, Tollie Pizza, MD [facility] [place]   In 1 month Bacigalupo, Marzella Schlein, MD [facility]   In 2 months Willeen Niece, MD [facility]   In 8 months McGowan, Elana Alm [facility] [place]

## 2023-03-15 NOTE — Telephone Encounter (Signed)
 Requested medication (s) are due for refill today: Yes  Requested medication (s) are on the active medication list: Yes  Last refill:  01/24/23  Future visit scheduled: Yes  Notes to clinic:  Unable to refill per protocol, cannot delegate.      Requested Prescriptions  Pending Prescriptions Disp Refills   pregabalin (LYRICA) 200 MG capsule [Pharmacy Med Name: PREGABALIN 200MG  CAPSULES] 90 capsule     Sig: TAKE 1 CAPSULE(200 MG) BY MOUTH THREE TIMES DAILY     Not Delegated - Neurology:  Anticonvulsants - Controlled - pregabalin Failed - 03/15/2023  4:19 PM      Failed - This refill cannot be delegated      Passed - Cr in normal range and within 360 days    Creat  Date Value Ref Range Status  10/21/2016 0.98 0.70 - 1.33 mg/dL Final    Comment:    For patients >60 years of age, the reference limit for Creatinine is approximately 13% higher for people identified as African-American. .    Creatinine, Ser  Date Value Ref Range Status  10/21/2022 0.76 0.76 - 1.27 mg/dL Final         Passed - Completed PHQ-2 or PHQ-9 in the last 360 days      Passed - Valid encounter within last 12 months    Recent Outpatient Visits           2 months ago Moderate episode of recurrent major depressive disorder [facility])   [place] [facility] [place], Marzella Schlein, MD   4 months ago Essential hypertension   [place] [facility] [place], Marzella Schlein, MD   5 months ago Post herpetic neuralgia   [place] [facility], Marzella Schlein, MD   6 months ago Herpes zoster without complication   [place] [facility] [place], Marzella Schlein, MD   9 months ago Chest pain, unspecified type   [facility] [place], Marzella Schlein, MD       Future Appointments             In 1 month Gollan, Tollie Pizza, MD [place] [facility] [place]   In 1 month Bacigalupo, Marzella Schlein, MD [facility], [facility]   In 2 months Willeen Niece, MD [facility] [place]   In 8 months McGowan, Elana Alm [facility] [place]

## 2023-03-16 ENCOUNTER — Other Ambulatory Visit: Payer: Self-pay | Admitting: Family Medicine

## 2023-03-16 ENCOUNTER — Ambulatory Visit: Payer: Self-pay | Admitting: Family Medicine

## 2023-03-16 DIAGNOSIS — M792 Neuralgia and neuritis, unspecified: Secondary | ICD-10-CM

## 2023-03-16 DIAGNOSIS — S8411XS Injury of peroneal nerve at lower leg level, right leg, sequela: Secondary | ICD-10-CM

## 2023-03-16 DIAGNOSIS — G894 Chronic pain syndrome: Secondary | ICD-10-CM

## 2023-03-16 NOTE — Telephone Encounter (Signed)
 Pt called about a refill for pregabalin (Lyrica) 200MG  capsule. Pt states he has one day of medication left. Pt states he needs the medication in order to work.  RN called the pt's pharmacy, pharmacy tech stated that the pt's provider was contacted yesterday for a refill and it is awaiting authorization. RN advised pt RN would relay concern and urgent need for refill to the appropriate person for follow-up.  Reason for Disposition  Pharmacy with prescription refill question and triager answers question  Answer Assessment - Initial Assessment Questions 1. DRUG NAME: "What medicine do you need to have refilled?"     Pregabalin (Lyrica) 2. REFILLS REMAINING: "How many refills are remaining?" (Note: The label on the medicine or pill bottle will show how many refills are remaining. If there are no refills remaining, then a renewal may be needed.)     0 3. EXPIRATION DATE: "What is the expiration date?" (Note: The label states when the prescription will expire, and thus can no longer be refilled.)     01/24/24 4. PRESCRIBING HCP: "Who prescribed it?" Reason: If prescribed by specialist, call should be referred to that group.     Bacigalupo  RN called pt's pharmacy to see if med is there. Pt states he received a call from the pharmacy yesterday. RN called the pharmacy. Pharmacy tech states the Dr. was contacted yesterday for a refill.  Protocols used: Medication Refill and Renewal Call-A-AH

## 2023-03-16 NOTE — Telephone Encounter (Signed)
 Copied from CRM [id number]. Topic: Clinical - Medication Refill >> Mar 16, 2023 11:14 AM Izetta Dakin wrote: Most Recent Primary Care Visit:  Provider: Erasmo Downer  Department: [id number]  Visit Type: OFFICE VISIT  Date: 12/21/2022  Medication: pregabalin (LYRICA) 200 MG capsule  Has the patient contacted their pharmacy? Yes (Agent: If no, request that the patient contact the pharmacy for the refill. If patient does not wish to contact the pharmacy document the reason why and proceed with request.) (Agent: If yes, when and what did the pharmacy advise?)  Is this the correct pharmacy for this prescription? Yes If no, delete pharmacy and type the correct one.  This is the patient's preferred pharmacy:   [id number] [place], [place] - [address] AT [facility] OF [address] [place] [address] Phone: [phone] Fax: [phone]   Has the prescription been filled recently? No  Is the patient out of the medication? Yes  Has the patient been seen for an appointment in the last year OR does the patient have an upcoming appointment? Yes  Can we respond through MyChart? Yes  Agent: Please be advised that Rx refills may take up to 3 business days. We ask that you follow-up with your pharmacy.

## 2023-03-17 NOTE — Telephone Encounter (Signed)
 Rx sent in by Dr. Neita Garnet.

## 2023-03-21 DIAGNOSIS — E7800 Pure hypercholesterolemia, unspecified: Secondary | ICD-10-CM | POA: Diagnosis not present

## 2023-03-21 DIAGNOSIS — E559 Vitamin D deficiency, unspecified: Secondary | ICD-10-CM | POA: Diagnosis not present

## 2023-03-21 DIAGNOSIS — G8929 Other chronic pain: Secondary | ICD-10-CM | POA: Diagnosis not present

## 2023-03-21 DIAGNOSIS — Z79899 Other long term (current) drug therapy: Secondary | ICD-10-CM | POA: Diagnosis not present

## 2023-03-21 DIAGNOSIS — I10 Essential (primary) hypertension: Secondary | ICD-10-CM | POA: Diagnosis not present

## 2023-03-21 DIAGNOSIS — M79671 Pain in right foot: Secondary | ICD-10-CM | POA: Diagnosis not present

## 2023-03-21 DIAGNOSIS — M4722 Other spondylosis with radiculopathy, cervical region: Secondary | ICD-10-CM | POA: Diagnosis not present

## 2023-03-22 ENCOUNTER — Ambulatory Visit (INDEPENDENT_AMBULATORY_CARE_PROVIDER_SITE_OTHER): Payer: 59 | Admitting: Podiatry

## 2023-03-22 DIAGNOSIS — M65971 Unspecified synovitis and tenosynovitis, right ankle and foot: Secondary | ICD-10-CM
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
## 2023-03-23 ENCOUNTER — Telehealth: Payer: Self-pay

## 2023-03-23 ENCOUNTER — Other Ambulatory Visit: Payer: Self-pay

## 2023-03-23 DIAGNOSIS — N529 Male erectile dysfunction, unspecified: Secondary | ICD-10-CM

## 2023-03-23 MED ORDER — SILDENAFIL CITRATE 100 MG PO TABS: 100.0000 mg | ORAL_TABLET | Freq: Every day | ORAL | 3 refills | Status: DC | PRN

## 2023-03-23 NOTE — Telephone Encounter (Signed)
 Pt LM on triage line-  Sildenafil was suppose to be sent to [facility].   Med was sent to [facility]   Rx sent to [facility]- pt aware.   Pt voiced that he is not taking Nitroglycerin.

## 2023-03-25 DIAGNOSIS — Z79899 Other long term (current) drug therapy: Secondary | ICD-10-CM | POA: Diagnosis not present

## 2023-04-01 ENCOUNTER — Other Ambulatory Visit: Payer: Self-pay

## 2023-04-01 DIAGNOSIS — Z122 Encounter for screening for malignant neoplasm of respiratory organs: Secondary | ICD-10-CM

## 2023-04-01 DIAGNOSIS — F17210 Nicotine dependence, cigarettes, uncomplicated: Secondary | ICD-10-CM

## 2023-04-01 DIAGNOSIS — Z87891 Personal history of nicotine dependence: Secondary | ICD-10-CM

## 2023-04-11 ENCOUNTER — Other Ambulatory Visit: Payer: Self-pay | Admitting: Adult Health

## 2023-04-11 DIAGNOSIS — G4733 Obstructive sleep apnea (adult) (pediatric): Secondary | ICD-10-CM | POA: Diagnosis not present

## 2023-04-12 ENCOUNTER — Other Ambulatory Visit: Payer: Self-pay | Admitting: Family Medicine

## 2023-04-12 DIAGNOSIS — G8929 Other chronic pain: Secondary | ICD-10-CM

## 2023-04-13 NOTE — Telephone Encounter (Signed)
 Requested medication (s) are due for refill today - yes  Requested medication (s) are on the active medication list -yes  Future visit scheduled -yes  Last refill: 12/21/22 #30 3RF  Notes to clinic: fails lab protocol- over 1 year-02/11/22  Requested Prescriptions  Pending Prescriptions Disp Refills   buPROPion (WELLBUTRIN XL) 150 MG 24 hr tablet [Pharmacy Med Name: BUPROPION XL 150MG  TABLETS (24 H)] 30 tablet 3    Sig: TAKE 1 TABLET(150 MG) BY MOUTH DAILY     Psychiatry: Antidepressants - bupropion Failed - 04/13/2023  8:58 AM      Failed - AST in normal range and within 360 days    AST  Date Value Ref Range Status  02/11/2022 18 14 - 40 Final         Failed - ALT in normal range and within 360 days    ALT  Date Value Ref Range Status  02/11/2022 17 10 - 40 U/L Final         Failed - Valid encounter within last 6 months    Recent Outpatient Visits   None     Future Appointments             In 1 week Gollan, Tollie Pizza, MD [facility] [place]   In 2 weeks Bacigalupo, Marzella Schlein, MD [facility]   In 1 month Willeen Niece, MD [facility] [place] [facility]   In 7 months McGowan, Elana Alm [facility] [place]            Passed - Cr in normal range and within 360 days    Creat  Date Value Ref Range Status  10/21/2016 0.98 0.70 - 1.33 mg/dL Final    Comment:    For patients >60 years of age, the reference limit for Creatinine is approximately 13% higher for people identified as African-American. .    Creatinine, Ser  Date Value Ref Range Status  10/21/2022 0.76 0.76 - 1.27 mg/dL Final         Passed - Completed PHQ-2 or PHQ-9 in the last 360 days      Passed - Last BP in normal range    BP Readings from Last 1 Encounters:  01/21/23 110/70            Requested Prescriptions  Pending Prescriptions Disp Refills   buPROPion (WELLBUTRIN XL) 150 MG 24 hr tablet [Pharmacy Med Name: BUPROPION XL  150MG  TABLETS (24 H)] 30 tablet 3    Sig: TAKE 1 TABLET(150 MG) BY MOUTH DAILY     Psychiatry: Antidepressants - bupropion Failed - 04/13/2023  8:58 AM      Failed - AST in normal range and within 360 days    AST  Date Value Ref Range Status  02/11/2022 18 14 - 40 Final         Failed - ALT in normal range and within 360 days    ALT  Date Value Ref Range Status  02/11/2022 17 10 - 40 U/L Final         Failed - Valid encounter within last 6 months    Recent Outpatient Visits   None     Future Appointments             In 1 week Gollan, Tollie Pizza, MD [facility] [place]   In 2 weeks Bacigalupo, Marzella Schlein, MD [facility]   In 1 month Arnold,  Delice Bison, MD [facility]   In 7 months McGowan, Elana Alm [facility] [place]            Passed - Cr in normal range and within 360 days    Creat  Date Value Ref Range Status  10/21/2016 0.98 0.70 - 1.33 mg/dL Final    Comment:    For patients >31 years of age, the reference limit for Creatinine is approximately 13% higher for people identified as African-American. .    Creatinine, Ser  Date Value Ref Range Status  10/21/2022 0.76 0.76 - 1.27 mg/dL Final         Passed - Completed PHQ-2 or PHQ-9 in the last 360 days      Passed - Last BP in normal range    BP Readings from Last 1 Encounters:  01/21/23 110/70

## 2023-04-14 ENCOUNTER — Other Ambulatory Visit: Payer: Self-pay | Admitting: Family Medicine

## 2023-04-14 DIAGNOSIS — G894 Chronic pain syndrome: Secondary | ICD-10-CM

## 2023-04-14 DIAGNOSIS — M792 Neuralgia and neuritis, unspecified: Secondary | ICD-10-CM

## 2023-04-14 DIAGNOSIS — S8411XS Injury of peroneal nerve at lower leg level, right leg, sequela: Secondary | ICD-10-CM

## 2023-04-14 NOTE — Telephone Encounter (Signed)
 Requested medication (s) are due for refill today: Yes  Requested medication (s) are on the active medication list: Yes  Last refill:  03/16/23  Future visit scheduled: Yes  Notes to clinic:  Unable to refill per protocol, cannot delegate. Patient request refill today, going out of town and will be out of meds today.     Requested Prescriptions  Pending Prescriptions Disp Refills   pregabalin (LYRICA) 200 MG capsule [Pharmacy Med Name: PREGABALIN 200MG  CAPSULES] 90 capsule     Sig: TAKE 1 CAPSULE(200 MG) BY MOUTH THREE TIMES DAILY     Not Delegated - Neurology:  Anticonvulsants - Controlled - pregabalin Failed - 04/14/2023 11:25 AM      Failed - This refill cannot be delegated      Failed - Valid encounter within last 12 months    Recent Outpatient Visits   None     Future Appointments             In 1 week Bacigalupo, Marzella Schlein, MD [facility]   In 1 week Mariah Milling, Tollie Pizza, MD [facility] [place]   In 1 month Willeen Niece, MD [facility]   In 7 months McGowan, Elana Alm [facility] [place]            Passed - Cr in normal range and within 360 days    Creat  Date Value Ref Range Status  10/21/2016 0.98 0.70 - 1.33 mg/dL Final    Comment:    For patients >36 years of age, the reference limit for Creatinine is approximately 13% higher for people identified as African-American. .    Creatinine, Ser  Date Value Ref Range Status  10/21/2022 0.76 0.76 - 1.27 mg/dL Final         Passed - Completed PHQ-2 or PHQ-9 in the last 360 days

## 2023-04-14 NOTE — Telephone Encounter (Signed)
 The patient called in stating he is going out of town tomorrow and didn't realize until last night that he was down to one pill. He says he cannot be without it and absolutely has to have it with him when he goes out of town. Can this please be called in by tomorrow morning?

## 2023-04-21 ENCOUNTER — Encounter: Payer: Self-pay | Admitting: Family Medicine

## 2023-04-21 ENCOUNTER — Ambulatory Visit: Admitting: Family Medicine

## 2023-04-21 VITALS — BP 123/84 | HR 76 | Ht 68.0 in | Wt 195.0 lb

## 2023-04-21 DIAGNOSIS — I10 Essential (primary) hypertension: Secondary | ICD-10-CM

## 2023-04-21 DIAGNOSIS — R7303 Prediabetes: Secondary | ICD-10-CM

## 2023-04-21 DIAGNOSIS — Z0000 Encounter for general adult medical examination without abnormal findings: Secondary | ICD-10-CM

## 2023-04-21 DIAGNOSIS — Z0001 Encounter for general adult medical examination with abnormal findings: Secondary | ICD-10-CM | POA: Diagnosis not present

## 2023-04-21 DIAGNOSIS — Z720 Tobacco use: Secondary | ICD-10-CM | POA: Diagnosis not present

## 2023-04-21 DIAGNOSIS — G8929 Other chronic pain: Secondary | ICD-10-CM | POA: Diagnosis not present

## 2023-04-21 DIAGNOSIS — E559 Vitamin D deficiency, unspecified: Secondary | ICD-10-CM | POA: Diagnosis not present

## 2023-04-21 DIAGNOSIS — Z79899 Other long term (current) drug therapy: Secondary | ICD-10-CM | POA: Diagnosis not present

## 2023-04-21 DIAGNOSIS — R490 Dysphonia: Secondary | ICD-10-CM | POA: Diagnosis not present

## 2023-04-21 DIAGNOSIS — I25118 Atherosclerotic heart disease of native coronary artery with other forms of angina pectoris: Secondary | ICD-10-CM

## 2023-04-21 DIAGNOSIS — M4722 Other spondylosis with radiculopathy, cervical region: Secondary | ICD-10-CM | POA: Diagnosis not present

## 2023-04-21 DIAGNOSIS — M79671 Pain in right foot: Secondary | ICD-10-CM | POA: Diagnosis not present

## 2023-04-21 DIAGNOSIS — F17210 Nicotine dependence, cigarettes, uncomplicated: Secondary | ICD-10-CM

## 2023-04-21 DIAGNOSIS — Z125 Encounter for screening for malignant neoplasm of prostate: Secondary | ICD-10-CM

## 2023-04-21 DIAGNOSIS — E7800 Pure hypercholesterolemia, unspecified: Secondary | ICD-10-CM | POA: Diagnosis not present

## 2023-04-21 DIAGNOSIS — K439 Ventral hernia without obstruction or gangrene: Secondary | ICD-10-CM | POA: Insufficient documentation

## 2023-04-21 NOTE — Assessment & Plan Note (Signed)
 Hoarseness for over two and a half months, fluctuating in severity, with no associated pain, drainage, or dysphagia. Differential diagnosis includes allergies and postnasal drip, exacerbated by smoking. Smoking increases risk for more serious conditions, but initial management focuses on allergy treatment. - Advise daily use of Flonase, two sprays in each nostril. - Advise daily use of Zyrtec or Claritin. - Initiate referral to ENT for further evaluation if symptoms do not improve.

## 2023-04-21 NOTE — Assessment & Plan Note (Signed)
Well controlled on current regimen of Amlodipine 2.5mg  daily, Hydrochlorothiazide 25mg  daily, and Losartan 50mg  daily. -Continue current antihypertensive regimen.
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
## 2023-04-21 NOTE — Assessment & Plan Note (Signed)
Stable on Rosuvastatin 40mg  daily. -Continue Rosuvastatin 40mg  daily.

## 2023-04-21 NOTE — Assessment & Plan Note (Signed)
 Recommend low carb diet Recheck A1c

## 2023-04-21 NOTE — Assessment & Plan Note (Signed)
 Long-term smoker with unsuccessful attempts to quit using varenicline, bupropion, and nicotine patches. Interested in trying hypnosis for smoking cessation. Acknowledges health risks associated with smoking and is motivated to quit with support from his partner. Medicare may not cover hypnosis, but he is willing to pay out of pocket. 3-5 min of counseling. - Provide information on local hypnosis services for smoking cessation. - Encourage continued efforts to quit smoking.

## 2023-04-21 NOTE — Assessment & Plan Note (Signed)
 Presence of a large abdominal hernia causing concern. Interested in surgical options for hernia repair and potential weight loss benefits. Discussed referral to [facility] for consultation. Robotic surgery may offer smaller incisions and quicker recovery. - Refer to [facility] for consultation regarding hernia repair.

## 2023-04-21 NOTE — Assessment & Plan Note (Signed)
 Stable on Isosorbide mononitrate.

## 2023-04-22 ENCOUNTER — Encounter: Payer: Self-pay | Admitting: Family Medicine

## 2023-04-22 LAB — COMPREHENSIVE METABOLIC PANEL WITH GFR
ALT: 16 IU/L (ref 0–44)
AST: 20 IU/L (ref 0–40)
Albumin: 4.1 g/dL (ref 3.9–4.9)
Alkaline Phosphatase: 88 IU/L (ref 44–121)
BUN/Creatinine Ratio: 20 (ref 10–24)
BUN: 17 mg/dL (ref 8–27)
Bilirubin Total: 0.2 mg/dL (ref 0.0–1.2)
CO2: 23 mmol/L (ref 20–29)
Calcium: 9.7 mg/dL (ref 8.6–10.2)
Chloride: 96 mmol/L (ref 96–106)
Creatinine, Ser: 0.83 mg/dL (ref 0.76–1.27)
Globulin, Total: 2.6 g/dL (ref 1.5–4.5)
Glucose: 94 mg/dL (ref 70–99)
Potassium: 4 mmol/L (ref 3.5–5.2)
Sodium: 137 mmol/L (ref 134–144)
Total Protein: 6.7 g/dL (ref 6.0–8.5)
eGFR: 97 mL/min/{1.73_m2} (ref >59)

## 2023-04-22 LAB — LIPID PANEL WITH LDL/HDL RATIO
Cholesterol, Total: 120 mg/dL (ref 100–199)
HDL: 46 mg/dL (ref >39)
LDL Chol Calc (NIH): 35 mg/dL (ref 0–99)
LDL/HDL Ratio: 0.8 ratio (ref 0.0–3.6)
Triglycerides: 253 mg/dL — ABNORMAL HIGH (ref 0–149)
VLDL Cholesterol Cal: 39 mg/dL (ref 5–40)

## 2023-04-22 LAB — HEMOGLOBIN A1C
Est. average glucose Bld gHb Est-mCnc: 117 mg/dL
Hgb A1c MFr Bld: 5.7 % — ABNORMAL HIGH (ref 4.8–5.6)

## 2023-04-22 LAB — PSA TOTAL (REFLEX TO FREE): Prostate Specific Ag, Serum: 0.5 ng/mL (ref 0.0–4.0)
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
## 2023-04-25 ENCOUNTER — Ambulatory Visit: Payer: 59 | Attending: Cardiovascular Disease | Admitting: Cardiovascular Disease

## 2023-04-25 VITALS — BP 110/62 | HR 80 | Ht 68.0 in | Wt 196.4 lb

## 2023-04-25 DIAGNOSIS — Z720 Tobacco use: Secondary | ICD-10-CM

## 2023-04-25 DIAGNOSIS — I5020 Unspecified systolic (congestive) heart failure: Secondary | ICD-10-CM

## 2023-04-25 DIAGNOSIS — I2510 Atherosclerotic heart disease of native coronary artery without angina pectoris: Secondary | ICD-10-CM

## 2023-04-25 DIAGNOSIS — J449 Chronic obstructive pulmonary disease, unspecified: Secondary | ICD-10-CM

## 2023-04-25 DIAGNOSIS — I10 Essential (primary) hypertension: Secondary | ICD-10-CM

## 2023-04-25 DIAGNOSIS — I25119 Atherosclerotic heart disease of native coronary artery with unspecified angina pectoris: Secondary | ICD-10-CM

## 2023-04-25 DIAGNOSIS — F1090 Alcohol use, unspecified, uncomplicated: Secondary | ICD-10-CM

## 2023-04-25 DIAGNOSIS — I5022 Chronic systolic (congestive) heart failure: Secondary | ICD-10-CM | POA: Diagnosis not present

## 2023-04-25 DIAGNOSIS — E785 Hyperlipidemia, unspecified: Secondary | ICD-10-CM

## 2023-04-25 DIAGNOSIS — I255 Ischemic cardiomyopathy: Secondary | ICD-10-CM

## 2023-04-25 MED ORDER — HYDROCHLOROTHIAZIDE 25 MG PO TABS: 25.0000 mg | ORAL_TABLET | Freq: Every day | ORAL | 3 refills | Status: DC

## 2023-04-25 MED ORDER — AMLODIPINE BESYLATE 2.5 MG PO TABS: 2.5000 mg | ORAL_TABLET | Freq: Every day | ORAL | 3 refills | Status: DC

## 2023-04-25 MED ORDER — NITROGLYCERIN 0.4 MG SL SUBL
0.4000 mg | SUBLINGUAL_TABLET | SUBLINGUAL | 3 refills | Status: AC | PRN
Start: 2023-04-25 — End: ?

## 2023-04-25 MED ORDER — LOSARTAN POTASSIUM 50 MG PO TABS: 50.0000 mg | ORAL_TABLET | Freq: Every day | ORAL | 3 refills | Status: DC

## 2023-04-25 NOTE — Patient Instructions (Addendum)
 Medication Instructions:  No changes  Hold the hydrochlorothiazide  on days in the hot sun  If you need a refill on your cardiac medications before your next appointment, please call your pharmacy.   Lab work: No new labs needed  Testing/Procedures: No new testing needed  Follow-Up: At [facility], you and your health needs are our priority.  As part of our continuing mission to provide you with exceptional heart care, we have created designated Provider Care Teams.  These Care Teams include your primary Cardiologist (physician) and Advanced Practice Providers (APPs -  Physician Assistants and Nurse Practitioners) who all work together to provide you with the care you need, when you need it.  You will need a follow up appointment in 12 months  Providers on your designated Care Team:   Laneta Pintos, NP Varney Gentleman, PA-C Cadence Gennaro Khat, [place]  COVID-19 Vaccine Information can be found at: [email or web address] For questions related to vaccine distribution or appointments, please email [email or web address] or call [phone].

## 2023-04-26 ENCOUNTER — Encounter: Payer: Self-pay | Admitting: General Surgery

## 2023-04-26 ENCOUNTER — Ambulatory Visit (INDEPENDENT_AMBULATORY_CARE_PROVIDER_SITE_OTHER): Admitting: General Surgery

## 2023-04-26 VITALS — BP 108/66 | HR 69 | Ht 68.0 in | Wt 193.0 lb

## 2023-04-26 DIAGNOSIS — M6208 Separation of muscle (nontraumatic), other site: Secondary | ICD-10-CM

## 2023-04-26 DIAGNOSIS — F17210 Nicotine dependence, cigarettes, uncomplicated: Secondary | ICD-10-CM | POA: Diagnosis not present

## 2023-04-26 DIAGNOSIS — K439 Ventral hernia without obstruction or gangrene: Secondary | ICD-10-CM | POA: Diagnosis not present

## 2023-04-26 NOTE — Patient Instructions (Addendum)
 We would need you to stop smoking before we could schedule surgery for you due to the chance of recurrence of your hernias.   We will have you follow up here in 6 months.   Belly Hernia (Ventral Hernia): What to Know  A ventral hernia is a bulge of tissue from inside the belly that pushes through a weak area of the belly. Sometimes, the bulge may have tissue from the small intestine or the large intestine. Ventral hernias do not go away without surgery. There are several types of ventral hernias. You may have: A hernia at a place where surgery was done (incisional hernia). A hernia just above the belly button (epigastric or paraumbilical hernia) or at the belly button (umbilical hernia). These can happen because of heavy lifting or straining. A hernia that comes and goes (reducible hernia). It may be visible only when you lift or strain. This type of hernia can be pushed back into the belly. A hernia that traps belly tissue inside the hernia (incarcerated hernia). This hernia cannot be pushed back into the belly. A hernia that cuts off blood flow to the tissues inside the hernia (strangulation hernia). The tissues can start to die if this happens. This is a very painful bulge that cannot be pushed back into the belly. This type of hernia is a medical emergency. What are the causes? This condition happens when tissue in the belly pushes on a weak area in the muscles. What increases the risk? You're more likely to have this condition if: You are 60 years or older. You had belly surgery in the past. This is common if there was an infection after surgery. You had an injury to the belly. You often lift or push heavy objects. You have been pregnant several times. You have long-term (chronic) health conditions that put pressure in your belly. These include: Being overweight or obese. Having a buildup of fluid inside your belly (ascites). You throw up or cough over and over again. You have trouble  pooping (constipation). You strain to poop or pee. What are the signs or symptoms? The only symptom of a ventral hernia may be a painless bulge in the belly.  Reducible hernia may be visible only when you strain, cough, or lift. Other symptoms may include: Dull pain. A feeling of pressure. Symptoms of an incarcerated hernia may include: Tenderness at hernia site. Bloating. Throwing up or feeling like you may throw up. Trouble pooping or no pooping at all. Symptoms of a strangulated hernia may include: More pain. Throwing up or feeling like you may throw up. Pain when pressing on the hernia. The skin over the hernia turning red or purple. Trouble pooping. Blood in the poop. How is this diagnosed? This condition may be diagnosed based on: Your symptoms and medical history. A physical exam. You may be asked to cough or strain while standing. These actions increase the pressure inside your belly and force the hernia through the opening in your belly. Your health care provider may try to reduce the hernia by gently pushing the hernia back in. Imaging studies, such as an ultrasound or CT scan. How is this treated? This condition is treated with surgery. If you have a strangulated hernia, surgery is done as soon as possible. If your hernia is small and not incarcerated, you may be asked to lose some weight before surgery. Follow these instructions at home: Eat and drink only as you've been told. Lose weight, if told by your provider. You  may have to avoid lifting. Ask your provider how much you can safely lift. Avoid activities that increase pressure on your hernia. Take your medicines only as told. You may need to take steps to help treat or prevent trouble pooping (constipation), such as: Taking medicines to help you poop. Eating foods high in fiber, like beans, whole grains, and fresh fruits and vegetables. Drinking more fluids as told. Ask your provider if it's safe to drive or use  machines while taking your medicine. Contact a health care provider if: Your hernia gets larger or feels hard. Your hernia becomes painful. Get help right away if: Your hernia becomes very painful. You have pain along with any of these: Changes in skin color in the area of the hernia. Feeling like throwing up. Throwing up. Fever. These symptoms may be an emergency. Call 911 right away. Do not wait to see if the symptoms will go away. Do not drive yourself to the hospital. This information is not intended to replace advice given to you by your health care provider. Make sure you discuss any questions you have with your health care provider. Document Revised: 06/29/2022 Document Reviewed: 06/29/2022 Elsevier Patient Education  2024 [employer] Inc.  Diastasis Recti  Diastasis recti is a condition in which the muscles of the abdomen (rectus abdominis muscles) become thin and separate. The result is a wider space between the muscles of the right and left abdomen (abdominal muscles). This wider space between the muscles may cause a bulge in the middle of the abdomen. This bulge may be noticed when a person is straining or when he or she sits up after lying down. Diastasis recti can affect men and women. It is most common among pregnant women, babies, people with obesity, and people who have had abdominal surgery. Exercise or surgery may help correct this condition. What are the causes? Common causes of this condition include: Pregnancy. As the uterus grows in size, it puts pressure on the abdominal muscles, causing the muscles to separate. Obesity. Excess fat puts pressure on abdominal muscles. Weight lifting. Some exercises of the abdomen. Advanced age. Genetics. Having had surgery on the abdomen before. What increases the risk? This condition is more likely to develop in: Women. Newborns, especially newborns who are born early (prematurely). What are the signs or symptoms? Common  symptoms of this condition include: A bulge in the middle of your abdomen. You will notice it most when you sit up or strain. Pain in your low back, hips, or the area between your hip bones (pelvis). Constipation. Being unable to control when you urinate (urinary incontinence). Bloating. Poor posture. How is this diagnosed? This condition is diagnosed with a physical exam. During the exam, your health care provider will ask you to lie flat on your back and do a crunch or half sit-up. If you have diastasis recti, a bulge will appear lengthwise between your abdominal muscles in the center of your abdomen. Your health care provider will measure the gap between your muscles with one of the following: A medical device used to measure the space between two objects (caliper). A tape measure. CT scan. Ultrasound. Finger spaces. Your health care provider will measure the space using his or her fingers. How is this treated? If your muscle separation is not too large, you may not need treatment. However, if you are a woman who plans to become pregnant again, you should treat this condition before your next pregnancy. Treatment may include: Physical therapy exercises to strengthen  and tighten your abdominal muscles. Lifestyle changes such as weight loss and exercise. Over-the-counter pain medicines as needed. Surgery to correct the separation. Follow these instructions at home: Activity Return to your normal activities as told by your health care provider. Ask your health care provider what activities are safe for you. Do exercises as told by your health care provider. Make sure you are doing your exercises and movements correctly when lifting weights or doing exercises using your abdominal muscles or the muscles in the center of your body that give stability (core muscles). Proper form can help to prevent this condition from happening again. General instructions If you are overweight, ask your health  care provider for help with weight loss. Losing even a small amount of weight can help to improve your diastasis recti. Take over-the-counter or prescription medicines only as told by your health care provider. Do not strain. Straining can make the separation worse. Examples of straining include: Pushing hard to have a bowel movement, such as when you have constipation. Lifting heavy objects or lifting children. Standing up and sitting down. You may need to take these actions to prevent or treat constipation: Drink enough fluid to keep your urine pale yellow. Take over-the-counter or prescription medicines. Eat foods that are high in fiber, such as beans, whole grains, and fresh fruits and vegetables. Limit foods that are high in fat and processed sugars, such as fried or sweet foods. Keep all follow-up visits. This is important. Contact a health care provider if: You notice a new bulge in your abdomen. Get help right away if: You experience severe discomfort in your abdomen. You develop severe abdominal pain along with nausea, vomiting, or a fever. Summary Diastasis recti is a condition in which the muscles of the abdomen (rectus abdominismuscles) become thin and separate. You may notice a bulge in your abdomen because the space has widened between the muscles of the right and left abdomen. The most common symptom is a bulge in the middle of your abdomen. You will notice it most when you sit up or strain. This condition is diagnosed with a physical exam. If the muscle separation is not too big, you may not need treatment. Otherwise, you may need to do physical therapy or have surgery. This information is not intended to replace advice given to you by your health care provider. Make sure you discuss any questions you have with your health care provider. Document Revised: 08/24/2022 Document Reviewed: 08/24/2022 Elsevier Patient Education  2024 [employer].
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
## 2023-05-02 ENCOUNTER — Encounter: Payer: Self-pay | Admitting: Family Medicine

## 2023-05-06 ENCOUNTER — Ambulatory Visit: Payer: Self-pay | Admitting: *Deleted

## 2023-05-06 NOTE — Telephone Encounter (Signed)
 Copied from CRM [id number]. Topic: Clinical - Red Word Triage >> May 06, 2023  9:54 AM [name] wrote: Red Word that prompted transfer to Nurse Triage: chest pain , previously had a heart attack

## 2023-05-06 NOTE — Telephone Encounter (Signed)
FYI please see the message below.

## 2023-05-06 NOTE — Telephone Encounter (Signed)
 Pt hung up before agent got him transferred to me.   I called him back because he said to the agent he felt like he was having a heart attack.  First attempt I left a voicemail to call back.   I tried again right away hoping he would pick up but still got his voicemail so left another message to call back.  I routed his call to the call back basket for further attempts.

## 2023-05-06 NOTE — Telephone Encounter (Signed)
 Chief Complaint: chest pain Symptoms: sudden central chest pain radiated to bottom jaw, right temple/head pain Frequency: occurred once and lasted around 30 minutes, resolved now Pertinent Negatives: Patient denies nausea, vomiting, sweating, difficulty breathing, dizziness Disposition: [x] ED /[] Urgent Care (no appt availability in office) / [] Appointment(In office/virtual)/ []  [place] [facility]/ [] Home Care/ [] Refused Recommended Disposition /[] [place] [facility]/ []  Follow-up with PCP Additional Notes: Patient states he was in [facility] waiting to get his shingles shot and the chest pain came on. He states he went to his sisters house and lied down, he states he feels better now. Advised patient that although the pain has resolved, he should still go to the ED to be evaluated. Patient has a history of heart attack and states this felt similar to this heart attack. Patient states he would like to call his sister first. Instructed patient to call back if he does not go to ED to let us  know.  Copied from CRM [id number]. Topic: Clinical - Red Word Triage >> May 06, 2023  9:54 AM [name] wrote: [facility] that prompted transfer to Nurse Triage: chest pain , previously had a heart attack   Reason for Disposition  [1] Chest pain lasts > 5 minutes AND [2] occurred in past 3 days (72 hours) (Exception: Feels exactly the same as previously diagnosed heartburn and has accompanying sour taste in mouth.)  Answer Assessment - Initial Assessment Questions 1. LOCATION: "Where does it hurt?"       Center of chest.  2. RADIATION: "Does the pain go anywhere else?" (e.g., into neck, jaw, arms, back)     Bottom jaw.  3. ONSET: "When did the chest pain begin?" (Minutes, hours or days)      This morning around 9 am.  4. PATTERN: "Does the pain come and go, or has it been constant since it started?"  "Does it get worse with exertion?"      Comes and goes, it was sudden.  5. DURATION: "How long does it  last" (e.g., seconds, minutes, hours)     "30 minutes or more."  6. SEVERITY: "How bad is the pain?"  (e.g., Scale 1-10; mild, moderate, or severe)    - MILD (1-3): doesn't interfere with normal activities     - MODERATE (4-7): interferes with normal activities or awakens from sleep    - SEVERE (8-10): excruciating pain, unable to do any normal activities       No pain at this time.  7. CARDIAC RISK FACTORS: "Do you have any history of heart problems or risk factors for heart disease?" (e.g., angina, prior heart attack; diabetes, high blood pressure, high cholesterol, smoker, or strong family history of heart disease)     Prior heart attack, stents.  8. PULMONARY RISK FACTORS: "Do you have any history of lung disease?"  (e.g., blood clots in lung, asthma, emphysema, birth control pills)     COPD, emphysema.  9. CAUSE: "What do you think is causing the chest pain?"     Unsure. He states he was turned away from a dentist appointment this morning because he states he has a sinus infection.  10. OTHER SYMPTOMS: "Do you have any other symptoms?" (e.g., dizziness, nausea, vomiting, sweating, fever, difficulty breathing, cough)       He states his head felt funny and the right side of his temple started hurting but it resolved.  11. PREGNANCY: "Is there any chance you are pregnant?" "When was your last menstrual period?"  N/A.  Protocols used: Chest Pain-A-AH

## 2023-05-09 NOTE — Telephone Encounter (Signed)
 Doesn't look like he went to the ED.  Can we call and check on him and see if chest pain has recurred?

## 2023-05-09 NOTE — Telephone Encounter (Signed)
 Pt reports he went home and laid down and when he woke up he no longer had any chest pain. Reports no concerns since waking up from his nap

## 2023-05-10 ENCOUNTER — Other Ambulatory Visit: Payer: Self-pay | Admitting: Family Medicine

## 2023-05-17 DIAGNOSIS — H5213 Myopia, bilateral: Secondary | ICD-10-CM | POA: Diagnosis not present

## 2023-05-18 DIAGNOSIS — M4722 Other spondylosis with radiculopathy, cervical region: Secondary | ICD-10-CM | POA: Diagnosis not present

## 2023-05-18 DIAGNOSIS — J449 Chronic obstructive pulmonary disease, unspecified: Secondary | ICD-10-CM | POA: Diagnosis not present

## 2023-05-18 DIAGNOSIS — L4050 Arthropathic psoriasis, unspecified: Secondary | ICD-10-CM | POA: Diagnosis not present

## 2023-05-18 DIAGNOSIS — J439 Emphysema, unspecified: Secondary | ICD-10-CM | POA: Diagnosis not present

## 2023-05-18 DIAGNOSIS — G8929 Other chronic pain: Secondary | ICD-10-CM | POA: Diagnosis not present

## 2023-05-18 DIAGNOSIS — M79671 Pain in right foot: Secondary | ICD-10-CM | POA: Diagnosis not present

## 2023-05-18 DIAGNOSIS — F17210 Nicotine dependence, cigarettes, uncomplicated: Secondary | ICD-10-CM | POA: Diagnosis not present

## 2023-05-31 ENCOUNTER — Ambulatory Visit: Payer: 59 | Admitting: Dermatology

## 2023-05-31 ENCOUNTER — Telehealth: Payer: Self-pay | Admitting: Urology

## 2023-05-31 DIAGNOSIS — N529 Male erectile dysfunction, unspecified: Secondary | ICD-10-CM

## 2023-05-31 MED ORDER — SILDENAFIL CITRATE 100 MG PO TABS: ORAL_TABLET | ORAL | 11 refills | Status: DC

## 2023-05-31 NOTE — Telephone Encounter (Signed)
 Pt needs his sildenafil  (VIAGRA ) 100 MG table refilled

## 2023-05-31 NOTE — Telephone Encounter (Signed)
 Sildenafil  recently discontinued on med list by cardiologist after recent episode of angina. Please advise if ok to refill.

## 2023-05-31 NOTE — Telephone Encounter (Signed)
 RX sent per Dr. Estanislao Heimlich. Patient advised not to take sildenafil  in conjunction with nitroglycerin .

## 2023-06-02 DIAGNOSIS — H10011 Acute follicular conjunctivitis, right eye: Secondary | ICD-10-CM | POA: Diagnosis not present

## 2023-06-03 MED ORDER — SILDENAFIL CITRATE 100 MG PO TABS: ORAL_TABLET | ORAL | 11 refills | Status: DC

## 2023-06-03 NOTE — Telephone Encounter (Signed)
 Patient called back stating he is not able to reach anyone at [facility] to get them to ship the medication and would like medication [facility] sent [facility]. [facility] and patient advised and he was advised to use GOOD rx coupon

## 2023-06-03 NOTE — Addendum Note (Signed)
 Addended by: Samona Chihuahua V on: 06/03/2023 04:07 PM   Modules accepted: Orders

## 2023-06-05 DIAGNOSIS — G4733 Obstructive sleep apnea (adult) (pediatric): Secondary | ICD-10-CM | POA: Diagnosis not present

## 2023-06-13 ENCOUNTER — Ambulatory Visit: Admitting: Physician Assistant

## 2023-06-13 ENCOUNTER — Ambulatory Visit: Admitting: Dermatology

## 2023-06-14 ENCOUNTER — Telehealth: Payer: Self-pay | Admitting: Family Medicine

## 2023-06-14 ENCOUNTER — Other Ambulatory Visit: Payer: Self-pay | Admitting: Family Medicine

## 2023-06-14 DIAGNOSIS — M792 Neuralgia and neuritis, unspecified: Secondary | ICD-10-CM

## 2023-06-14 DIAGNOSIS — G894 Chronic pain syndrome: Secondary | ICD-10-CM

## 2023-06-14 DIAGNOSIS — S8411XS Injury of peroneal nerve at lower leg level, right leg, sequela: Secondary | ICD-10-CM

## 2023-06-14 NOTE — Telephone Encounter (Signed)
 Called and spoke to the pt to inform him the medication request has been received, it was explained there is a 72 hour turn around time for medication quest. He verbally stated he understood, and would go to the ED if he didn't get it soon.

## 2023-06-14 NOTE — Telephone Encounter (Unsigned)
 Copied from CRM [id number]. Topic: Clinical - Medication Refill >> Jun 14, 2023 10:27 AM Essie A wrote: Medication: pregabalin  (LYRICA ) 200 MG capsule  Has the patient contacted their pharmacy? Yes (Agent: If no, request that the patient contact the pharmacy for the refill. If patient does not wish to contact the pharmacy document the reason why and proceed with request.) (Agent: If yes, when and what did the pharmacy advise?)  This is the patient's preferred pharmacy:   [id number] [place], [place] - [address] AT [facility] OF [address] [place] [address] Phone: [phone] Fax: [phone]  Is this the correct pharmacy for this prescription? Yes If no, delete pharmacy and type the correct one.   Has the prescription been filled recently? Yes  Is the patient out of the medication? Yes since this morning  Has the patient been seen for an appointment in the last year OR does the patient have an upcoming appointment? Yes  Can we respond through MyChart? Yes  Agent: Please be advised that Rx refills may take up to 3 business days. We ask that you follow-up with your pharmacy.

## 2023-06-14 NOTE — Telephone Encounter (Signed)
 Patient is calling for 2nd time.  He said he can't wait 3 days for Pregabalin  200 mg.  and needs this called in today.  [facility] [address].

## 2023-06-15 NOTE — Telephone Encounter (Signed)
 Duplicate request, refilled 06/15/23.  Requested Prescriptions  Pending Prescriptions Disp Refills   pregabalin  (LYRICA ) 200 MG capsule 90 capsule 1     Not Delegated - Neurology:  Anticonvulsants - Controlled - pregabalin  Failed - 06/15/2023  1:55 PM      Failed - This refill cannot be delegated      Passed - Cr in normal range and within 360 days    Creat  Date Value Ref Range Status  10/21/2016 0.98 0.70 - 1.33 mg/dL Final    Comment:    For patients >65 years of age, the reference limit for Creatinine is approximately 13% higher for people identified as African-American. .    Creatinine, Ser  Date Value Ref Range Status  04/21/2023 0.83 0.76 - 1.27 mg/dL Final         Passed - Completed PHQ-2 or PHQ-9 in the last 360 days      Passed - Valid encounter within last 12 months    Recent Outpatient Visits           1 month ago Welcome to [employer] preventive visit   [facility] [place], Stan Eans, MD       Future Appointments             In 4 months Bacigalupo, Stan Eans, MD [facility]   In 5 months McGowan, Nyra Bellis [facility] [place]

## 2023-06-17 DIAGNOSIS — M4722 Other spondylosis with radiculopathy, cervical region: Secondary | ICD-10-CM | POA: Diagnosis not present

## 2023-06-17 DIAGNOSIS — G8929 Other chronic pain: Secondary | ICD-10-CM | POA: Diagnosis not present

## 2023-06-17 DIAGNOSIS — F17210 Nicotine dependence, cigarettes, uncomplicated: Secondary | ICD-10-CM | POA: Diagnosis not present

## 2023-06-17 DIAGNOSIS — M79671 Pain in right foot: Secondary | ICD-10-CM | POA: Diagnosis not present

## 2023-06-27 ENCOUNTER — Telehealth: Payer: Self-pay

## 2023-06-27 ENCOUNTER — Ambulatory Visit: Payer: Self-pay

## 2023-06-27 DIAGNOSIS — H524 Presbyopia: Secondary | ICD-10-CM | POA: Diagnosis not present

## 2023-06-27 NOTE — Telephone Encounter (Signed)
 LMTCB, [facility] Triage Nurse may give patient results/message   Need to know why he feels he needs to be excused from duty.  Will also need to have his [place] number    Copied from CRM [id number]. Topic: General - Other >> Jun 27, 2023  8:39 AM Cristopher B wrote: Reason for CRM: pt would like to see if his PCP can excuse him from jury duty this Thursday 06/30/2023. If this is possible to give him a call .

## 2023-06-27 NOTE — Telephone Encounter (Signed)
 FYI Only or Action Required?: Action required by provider: Wants to be excused for jury duty due to not being able to sit there with a foot sore; states will call back with jury number.  Patient was last seen in primary care on 04/21/2023 by Myrla Jon HERO, MD. Called Nurse Triage reporting Advice Only. Symptoms began today. Interventions attempted: Nothing. Symptoms are: stable.  Triage Disposition: No disposition on file.  Patient/caregiver understands and will follow disposition?:      Copied from CRM [id number]. Topic: General - Other >> Jun 27, 2023  3:31 PM Sophia H wrote: Reason for CRM: Patient returning missed call. Per note in chart :  LMTCB, [facility] Triage Nurse may give patient results/message   Need to know why he feels he needs to be excused from duty.  Will also need to have his Jury number  - Nurse Triage

## 2023-06-28 NOTE — Telephone Encounter (Signed)
Duplicate. Please see other encounter.

## 2023-06-30 NOTE — Telephone Encounter (Signed)
 NA/ not able to Leave voice message.

## 2023-07-06 DIAGNOSIS — L400 Psoriasis vulgaris: Secondary | ICD-10-CM | POA: Diagnosis not present

## 2023-07-06 DIAGNOSIS — L4050 Arthropathic psoriasis, unspecified: Secondary | ICD-10-CM | POA: Diagnosis not present

## 2023-07-06 DIAGNOSIS — Z7960 Long term (current) use of unspecified immunomodulators and immunosuppressants: Secondary | ICD-10-CM | POA: Diagnosis not present

## 2023-07-06 DIAGNOSIS — M150 Primary generalized (osteo)arthritis: Secondary | ICD-10-CM | POA: Diagnosis not present

## 2023-07-11 DIAGNOSIS — G4733 Obstructive sleep apnea (adult) (pediatric): Secondary | ICD-10-CM | POA: Diagnosis not present

## 2023-07-13 ENCOUNTER — Ambulatory Visit (INDEPENDENT_AMBULATORY_CARE_PROVIDER_SITE_OTHER): Admitting: Dermatology

## 2023-07-13 ENCOUNTER — Encounter: Payer: Self-pay | Admitting: Dermatology

## 2023-07-13 DIAGNOSIS — L4050 Arthropathic psoriasis, unspecified: Secondary | ICD-10-CM | POA: Diagnosis not present

## 2023-07-13 DIAGNOSIS — L409 Psoriasis, unspecified: Secondary | ICD-10-CM | POA: Diagnosis not present

## 2023-07-13 DIAGNOSIS — Z79899 Other long term (current) drug therapy: Secondary | ICD-10-CM | POA: Diagnosis not present

## 2023-07-13 MED ORDER — HYDROCORTISONE 2.5 % EX CREA: TOPICAL_CREAM | Freq: Two times a day (BID) | CUTANEOUS | 2 refills | Status: DC | PRN

## 2023-07-13 MED ORDER — CLOBETASOL PROPIONATE 0.05 % EX SOLN: 1.0000 | Freq: Two times a day (BID) | CUTANEOUS | 2 refills | Status: DC

## 2023-07-13 NOTE — Progress Notes (Signed)
   Follow-Up Visit   Subjective  Caleb [name] is a 68 y.o. male who presents for the following: psoriasis. Patient was on Cosentyx  but is not on it now. We sent in 6 months worth of refills but patient said he never got them. Also on methotrexate prescribed by Dr. Tobie, who recommend patient see dermatologist to get back on Cosentyx  due to worsening arthritis. Patient not using any topicals because he states he does not have any.   The following portions of the chart were reviewed this encounter and updated as appropriate: medications, allergies, medical history  Review of Systems:  No other skin or systemic complaints except as noted in HPI or Assessment and Plan.  Objective  Well appearing patient in no apparent distress; mood and affect are within normal limits.   A focused examination was performed of the following areas: Scalp, face, ears, legs, arms  Relevant exam findings are noted in the Assessment and Plan.    Assessment & Plan   PSORIASIS/PSORIATIC ARTHRITIS  Exam: Pink scaly plaque b/l ankles, eyebrows, ears, excoriated pink papules at occipital scalp, pink scaly patch at right elbow 6% BSA.  Chronic and persistent condition with duration or expected duration over one year. Condition is bothersome/symptomatic for patient. Currently flared.  Patient with joint pain/psoriatic arthritis. Rheumatologist recommends patient restart Cosentyx .   Psoriasis is a chronic non-curable, but treatable genetic/hereditary disease that may have other systemic features affecting other organ systems such as joints (Psoriatic Arthritis). It is associated with an increased risk of inflammatory bowel disease, heart disease, non-alcoholic fatty liver disease, and depression.  Treatments include light and laser treatments; topical medications; and systemic medications including oral and injectables.  Treatment Plan: Will send Cosentyx  back in for patient pending TB test results.  07/06/23 labs  reviewed- wnl Start clobetasol  solution 1-2 times daily to scalp and body prn. Avoid applying to face, groin, and axilla. Use as directed. Long-term use can cause thinning of the skin. Start HC 2.5% cr 1-2 times daily to aa face, ears prn.   Reviewed risks of biologics including immunosuppression, infections, injection site reaction, and failure to improve condition. Goal is control of skin condition, not cure.  Some older biologics such as Humira and Enbrel may slightly increase risk of malignancy and may worsen congestive heart failure.  Taltz and Cosentyx  may cause inflammatory bowel disease to flare. The use of biologics requires long term medication management, including periodic office visits and monitoring of blood work.    PSORIASIS   Related Procedures QuantiFERON-TB Gold Plus Related Medications COSENTYX  SENSOREADY, 300 MG, 150 MG/ML SOAJ INJECT 300 MG (2 PENS) SUBCUTANEOUSLY EVERY 4 WEEKS FOR MAINTENANCE hydrocortisone  2.5 % cream Apply topically 2 (two) times daily as needed (Rash). Twice daily to aa at face, ears prn. clobetasol  (TEMOVATE ) 0.05 % external solution Apply 1 Application topically 2 (two) times daily. To aa scalp and body prn. Avoid applying to face, groin, and axilla. Use as directed. Long-term use can cause thinning of the skin.  Return in about 6 months (around 01/13/2024) for Psoriasis, with Dr. Jackquline.  LILLETTE Lonell Drones, RMA, am acting as scribe for Rexene Jackquline, MD .   Documentation: I have reviewed the above documentation for accuracy and completeness, and I agree with the above.  Rexene Jackquline, MD

## 2023-07-13 NOTE — Patient Instructions (Addendum)
 Recommend over the counter Neutrogena Tar Shampoo.  Treatment Plan: Will send Cosentyx  back in for patient.  Start clobetasol  solution 1-2 times daily to scalp and body as needed. Avoid applying to face, groin, and axilla. Use as directed. Long-term use can cause thinning of the skin. Start HC 2.5% cr 1-2 times daily to affected areas face, ears as needed.   Topical steroids (such as triamcinolone , fluocinolone , fluocinonide, mometasone , clobetasol , halobetasol, betamethasone , hydrocortisone ) can cause thinning and lightening of the skin if they are used for too long in the same area. Your physician has selected the right strength medicine for your problem and area affected on the body. Please use your medication only as directed by your physician to prevent side effects.   Due to recent changes in healthcare laws, you may see results of your pathology and/or laboratory studies on MyChart before the doctors have had a chance to review them. We understand that in some cases there may be results that are confusing or concerning to you. Please understand that not all results are received at the same time and often the doctors may need to interpret multiple results in order to provide you with the best plan of care or course of treatment. Therefore, we ask that you please give us  2 business days to thoroughly review all your results before contacting the office for clarification. Should we see a critical lab result, you will be contacted sooner.   If You Need Anything After Your Visit  If you have any questions or concerns for your doctor, please call our main line at [phone] and press option 4 to reach your doctor's medical assistant. If no one answers, please leave a voicemail as directed and we will return your call as soon as possible. Messages left after 4 pm will be answered the following business day.   You may also send us  a message via MyChart. We typically respond to MyChart messages  within 1-2 business days.  For prescription refills, please ask your pharmacy to contact our office. Our fax number is [phone].  If you have an urgent issue when the clinic is closed that cannot wait until the next business day, you can page your doctor at the number below.    Please note that while we do our best to be available for urgent issues outside of office hours, we are not available 24/7.   If you have an urgent issue and are unable to reach us , you may choose to seek medical care at your doctor's office, retail clinic, urgent care center, or emergency room.  If you have a medical emergency, please immediately call 911 or go to the emergency department.  Pager Numbers  - Dr. Hester: [phone]  - Dr. Jackquline: [phone]  - Dr. Claudene: [phone]   In the event of inclement weather, please call our main line at [phone] for an update on the status of any delays or closures.  Dermatology Medication Tips: Please keep the boxes that topical medications come in in order to help keep track of the instructions about where and how to use these. Pharmacies typically print the medication instructions only on the boxes and not directly on the medication tubes.   If your medication is too expensive, please contact our office at [phone] option 4 or send us  a message through MyChart.   We are unable to tell what your co-pay for medications will be in advance as this is different depending on your insurance coverage. However, we may  be able to find a substitute medication at lower cost or fill out paperwork to get insurance to cover a needed medication.   If a prior authorization is required to get your medication covered by your insurance company, please allow us  1-2 business days to complete this process.  Drug prices often vary depending on where the prescription is filled and some pharmacies may offer cheaper prices.  The website www.goodrx.com contains coupons  for medications through different pharmacies. The prices here do not account for what the cost may be with help from insurance (it may be cheaper with your insurance), but the website can give you the price if you did not use any insurance.  - You can print the associated coupon and take it with your prescription to the pharmacy.  - You may also stop by our office during regular business hours and pick up a GoodRx coupon card.  - If you need your prescription sent electronically to a different pharmacy, notify our office through [facility] or by phone at [phone] option 4.     Si Usted Necesita Algo Despus de Su Visita  Tambin puede enviarnos un mensaje a travs de Clinical cytogeneticist. Por lo general respondemos a los mensajes de MyChart en el transcurso de 1 a 2 das hbiles.  Para renovar recetas, por favor pida a su farmacia que se ponga en contacto con nuestra oficina. Randi lakes de fax es [place] [phone].  Si tiene un asunto urgente cuando la clnica est cerrada y que no puede esperar hasta el siguiente da hbil, puede llamar/localizar a su doctor(a) al nmero que aparece a continuacin.   Por favor, tenga en cuenta que aunque hacemos todo lo posible para estar disponibles para asuntos urgentes fuera del horario de [place], no estamos disponibles las 24 horas del da, los 7 [address]  [address] de la [place].   Si tiene un problema urgente y no puede comunicarse con nosotros, puede optar por buscar atencin mdica  en el consultorio de su doctor(a), en una clnica privada, en un centro de atencin urgente o en una sala de emergencias.  Si tiene Engineer, drilling, por favor llame inmediatamente al 911 o vaya a la sala de emergencias.  Nmeros de bper  - Dr. Hester: [phone]  - Dra. Jackquline: [phone]  - Dr. Claudene: [phone]   En caso de inclemencias del tiempo, por favor llame a landry capes principal al [phone] para una actualizacin sobre el [place] de cualquier  retraso o cierre.  Consejos para la medicacin en dermatologa: Por favor, guarde las cajas en las que vienen los medicamentos de uso tpico para ayudarle a seguir las instrucciones sobre dnde y cmo usarlos. Las farmacias generalmente imprimen las instrucciones del medicamento slo en las cajas y no directamente en los tubos del [place].   Si su medicamento es muy caro, por favor, pngase en contacto con landry rieger llamando al [phone] y presione la opcin 4 o envenos un mensaje a travs de Clinical cytogeneticist.   No podemos decirle cul ser su copago por los medicamentos por adelantado ya que esto es diferente dependiendo de la cobertura de su seguro. Sin embargo, es posible que podamos encontrar un medicamento sustituto a Audiological scientist un formulario para que el seguro cubra el medicamento que se considera necesario.   Si se requiere una autorizacin previa para que su compaa de seguros malta su medicamento, por favor permtanos de 1 a 2 das hbiles para completar este proceso.  Los precios de los [employer]  varan con frecuencia dependiendo del lugar de dnde se surte la receta y alguna farmacias pueden ofrecer precios ms baratos.  El sitio web www.goodrx.com tiene cupones para medicamentos de Health and safety inspector. Los precios aqu no tienen en cuenta lo que podra costar con la ayuda del seguro (puede ser ms barato con su seguro), pero el sitio web puede darle el precio si no utiliz Tourist information centre manager.  - Puede imprimir el cupn correspondiente y llevarlo con su receta a la farmacia.  - Tambin puede pasar por nuestra oficina durante el horario de atencin regular y Education officer, museum una tarjeta de cupones de GoodRx.  - Si necesita que su receta se enve electrnicamente a una farmacia diferente, informe a nuestra oficina a travs de MyChart de [place] o por telfono llamando al [phone] y presione la opcin 4.

## 2023-07-15 ENCOUNTER — Telehealth: Payer: Self-pay | Admitting: Family Medicine

## 2023-07-15 DIAGNOSIS — J439 Emphysema, unspecified: Secondary | ICD-10-CM | POA: Diagnosis not present

## 2023-07-15 DIAGNOSIS — Z79899 Other long term (current) drug therapy: Secondary | ICD-10-CM | POA: Diagnosis not present

## 2023-07-15 DIAGNOSIS — M79671 Pain in right foot: Secondary | ICD-10-CM | POA: Diagnosis not present

## 2023-07-15 DIAGNOSIS — F17210 Nicotine dependence, cigarettes, uncomplicated: Secondary | ICD-10-CM | POA: Diagnosis not present

## 2023-07-15 DIAGNOSIS — L4050 Arthropathic psoriasis, unspecified: Secondary | ICD-10-CM | POA: Diagnosis not present

## 2023-07-15 DIAGNOSIS — G8929 Other chronic pain: Secondary | ICD-10-CM | POA: Diagnosis not present

## 2023-07-15 DIAGNOSIS — M4722 Other spondylosis with radiculopathy, cervical region: Secondary | ICD-10-CM | POA: Diagnosis not present

## 2023-07-15 DIAGNOSIS — J449 Chronic obstructive pulmonary disease, unspecified: Secondary | ICD-10-CM | POA: Diagnosis not present

## 2023-07-15 NOTE — Telephone Encounter (Signed)
 Pharmacy Rep confirmed refill available and is ready for pick-up.

## 2023-07-15 NOTE — Telephone Encounter (Signed)
 Copied from CRM [id number]. Topic: Clinical - Medication Refill >> Jul 15, 2023 10:44 AM Elle L wrote: Medication: pregabalin  (LYRICA ) 200 MG capsule   The patient is requesting a 90 day supply.   Has the patient contacted their pharmacy? Yes  This is the patient's preferred pharmacy:  [id number] [place], [place] - [address] AT [facility] OF [address] [place] [address] Phone: [phone] Fax: [phone]  Is this the correct pharmacy for this prescription? Yes  Has the prescription been filled recently? Yes  Is the patient out of the medication? Yes  Has the patient been seen for an appointment in the last year OR does the patient have an upcoming appointment? Yes  Can we respond through MyChart? Yes  Agent: Please be advised that Rx refills may take up to 3 business days. We ask that you follow-up with your pharmacy.

## 2023-07-16 LAB — QUANTIFERON-TB GOLD PLUS
QuantiFERON Mitogen Value: >10 [IU]/mL
QuantiFERON Nil Value: 0.06 [IU]/mL
QuantiFERON TB1 Ag Value: 0.07 [IU]/mL
QuantiFERON TB2 Ag Value: 0.1 [IU]/mL
QuantiFERON-TB Gold Plus: NEGATIVE

## 2023-07-18 ENCOUNTER — Ambulatory Visit: Payer: Self-pay | Admitting: Dermatology

## 2023-07-18 DIAGNOSIS — L409 Psoriasis, unspecified: Secondary | ICD-10-CM

## 2023-07-18 MED ORDER — COSENTYX SENSOREADY (300 MG) 150 MG/ML ~~LOC~~ SOAJ
SUBCUTANEOUS | 5 refills | Status: AC
Start: 2023-07-18 — End: ?

## 2023-07-18 NOTE — Telephone Encounter (Signed)
 Advised pt of lab results.  Refills of Cosentyx  sent to [facility]

## 2023-07-18 NOTE — Telephone Encounter (Signed)
-----   Message from Rexene Rattler sent at 07/18/2023 10:37 AM EDT ----- TB negative, send in Cosentyx  Rx - please call patient ----- Message ----- From: Rebecka Memos Lab Results In Sent: 07/16/2023   7:36 AM EDT To: Rexene Rattler, MD

## 2023-08-09 ENCOUNTER — Other Ambulatory Visit: Payer: Self-pay | Admitting: Cardiovascular Disease

## 2023-08-15 DIAGNOSIS — M79671 Pain in right foot: Secondary | ICD-10-CM | POA: Diagnosis not present

## 2023-08-15 DIAGNOSIS — G8929 Other chronic pain: Secondary | ICD-10-CM | POA: Diagnosis not present

## 2023-08-15 DIAGNOSIS — Z79899 Other long term (current) drug therapy: Secondary | ICD-10-CM | POA: Diagnosis not present

## 2023-08-15 DIAGNOSIS — Z131 Encounter for screening for diabetes mellitus: Secondary | ICD-10-CM | POA: Diagnosis not present

## 2023-08-15 DIAGNOSIS — Z1159 Encounter for screening for other viral diseases: Secondary | ICD-10-CM | POA: Diagnosis not present

## 2023-08-22 ENCOUNTER — Other Ambulatory Visit: Payer: Self-pay

## 2023-08-22 ENCOUNTER — Telehealth: Payer: Self-pay | Admitting: Family Medicine

## 2023-08-22 MED ORDER — BUPROPION HCL ER (XL) 150 MG PO TB24: 150.0000 mg | ORAL_TABLET | Freq: Every day | ORAL | 3 refills | Status: DC

## 2023-08-22 NOTE — Telephone Encounter (Signed)
[facility] pharmacy faxed refill request for the following medications:   buPROPion (WELLBUTRIN XL) 150 MG 24 hr tablet     Please advise

## 2023-08-29 ENCOUNTER — Encounter: Payer: Self-pay | Admitting: Physician Assistant

## 2023-08-29 ENCOUNTER — Encounter (INDEPENDENT_AMBULATORY_CARE_PROVIDER_SITE_OTHER): Admitting: Physician Assistant

## 2023-08-29 ENCOUNTER — Ambulatory Visit: Payer: Self-pay

## 2023-08-29 NOTE — Telephone Encounter (Signed)
 Copied from CRM [id number]. Topic: Clinical - Red Word Triage >> Aug 29, 2023  8:53 AM Caleb [name] wrote: Red Word that prompted transfer to Nurse Triage: pain due to constipation, experiencing for the last 3 weeks Reason for Disposition . [1] Constant abdominal pain AND [2] present > 2 hours  Answer Assessment - Initial Assessment Questions 1. STOOL PATTERN OR FREQUENCY: How often do you have [name] bowel movement (BM)?  (Normal range: 3 times [name] day to every 3 days)  When was your last BM?       Normally daily 2. STRAINING: Do you have to strain to have [name] BM?      yes 3. ONSET: When did the constipation begin?     3 weeks 4. RECTAL PAIN: Does your rectum hurt when the stool comes out? If Yes, ask: Do you have hemorrhoids? How bad is the pain?  (Scale 1-10; or mild, moderate, severe)     no 5. BM COMPOSITION: Are the stools hard?      hard 6. BLOOD ON STOOLS: Has there been any blood on the toilet tissue or on the surface of the BM? If Yes, ask: When was the last time?     no 7. CHRONIC CONSTIPATION: Is this [name] new problem for you?  If No, ask: How long have you had this problem? (days, weeks, months)      no 8. CHANGES IN DIET OR HYDRATION: Have there been any recent changes in your diet? How much fluids are you drinking on [name] daily basis?  How much have you had to drink today?     no 9. MEDICINES: Have you been taking any new medicines? Are you taking any narcotic pain medicines? (e.g., Dilaudid, morphine , Percocet, Vicodin)     no 10. LAXATIVES: Have you been using any stool softeners, laxatives, or enemas?  If Yes, ask What are you using, how often, and when was the last time?       Mag citrate 11. ACTIVITY:  How much walking do you do every day?  Has your activity level decreased in the past week?         12. CAUSE: What do you think is causing the constipation?        unsure 13. MEDICAL HISTORY: Do you have [name] history of hemorrhoids, rectal  fissures, rectal surgery, or rectal abscess?         no 14. OTHER SYMPTOMS: Do you have any other symptoms? (e.g., abdomen pain, bloating, fever, vomiting)       Abdominal pain 15. PREGNANCY: Is there any chance you are pregnant? When was your last menstrual period?       N/[name]  Protocols used: Constipation-[name]-AH

## 2023-08-29 NOTE — Telephone Encounter (Signed)
 FYI to Janna - seeing you today for constipation

## 2023-08-29 NOTE — Telephone Encounter (Signed)
 FYI Only or Action Required?: Action required by provider: request for appointment.  Patient was last seen in primary care on 04/21/2023 by Myrla Jon HERO, MD.  Called Nurse Triage reporting Constipation.  Symptoms began several weeks ago.  Interventions attempted: OTC medications: mag citrate .  Symptoms are: gradually worsening.  Triage Disposition: See HCP Within 4 Hours (Or PCP Triage)  Patient/caregiver understands and will follow disposition?: Yes

## 2023-08-30 ENCOUNTER — Ambulatory Visit: Admitting: Family Medicine

## 2023-08-30 ENCOUNTER — Encounter: Payer: Self-pay | Admitting: Family Medicine

## 2023-08-30 ENCOUNTER — Ambulatory Visit
Admission: RE | Admit: 2023-08-30 | Discharge: 2023-08-30 | Disposition: A | Discharge status: Home / Self Care | Source: Ambulatory Visit | Attending: Family Medicine | Admitting: Family Medicine

## 2023-08-30 ENCOUNTER — Ambulatory Visit: Payer: Self-pay | Admitting: Family Medicine

## 2023-08-30 ENCOUNTER — Ambulatory Visit (INDEPENDENT_AMBULATORY_CARE_PROVIDER_SITE_OTHER): Admitting: Family Medicine

## 2023-08-30 VITALS — BP 138/93 | HR 71 | Temp 97.6°F | Ht 68.0 in | Wt 199.0 lb

## 2023-08-30 DIAGNOSIS — Z8719 Personal history of other diseases of the digestive system: Secondary | ICD-10-CM

## 2023-08-30 DIAGNOSIS — R1032 Left lower quadrant pain: Secondary | ICD-10-CM | POA: Insufficient documentation

## 2023-08-30 DIAGNOSIS — Z9049 Acquired absence of other specified parts of digestive tract: Secondary | ICD-10-CM

## 2023-08-30 DIAGNOSIS — N200 Calculus of kidney: Secondary | ICD-10-CM | POA: Diagnosis not present

## 2023-08-30 DIAGNOSIS — K5900 Constipation, unspecified: Secondary | ICD-10-CM | POA: Diagnosis not present

## 2023-08-30 DIAGNOSIS — K5792 Diverticulitis of intestine, part unspecified, without perforation or abscess without bleeding: Secondary | ICD-10-CM | POA: Diagnosis not present

## 2023-08-30 MED ORDER — IOHEXOL 9 MG/ML PO SOLN
500.0000 mL | ORAL | Status: AC
  Administered 2023-08-30: 500 mL via ORAL

## 2023-08-30 MED ORDER — IOHEXOL 300 MG/ML  SOLN
100.0000 mL | Freq: Once | INTRAMUSCULAR | Status: AC | PRN
  Administered 2023-08-30: 100 mL via INTRAVENOUS

## 2023-08-30 NOTE — Progress Notes (Signed)
 Acute visit   Patient: Caleb [name]   DOB: 1955/08/18   68 y.o. Male  MRN: [id number] PCP: Myrla Jon HERO, MD   Chief Complaint  Patient presents with   Acute Visit    Patient is here for constipation, stomach pain on the left side comes all the way around to his side.  The past 4 weeks he has been using Magnesium Citrate.  Worried because the stomach pain it is where they took 2 feet of his colon out when he had diverticulitis.  Has some relief occasionally without using any kind of medication.  Reports that he feels bloated sometimes and he can thump it he states it feels like liquid.  Very concerned.   Subjective    Discussed the use of AI scribe software for clinical note transcription with the patient, who gave verbal consent to proceed.  History of Present Illness   Caleb [name] is a 68 year old male with diverticulitis who presents with left-sided abdominal pain and constipation.  He experiences severe left-sided abdominal pain radiating around his side, intense enough to consider an emergency room visit. Magnesium citrate provides relief by alleviating constipation, though the pain does not completely resolve. A similar episode occurred the following weekend, with magnesium citrate again providing relief.  He describes bloating and abdominal distension, feeling as though his abdomen is 'full of liquid.' He maintains a routine of eating a sandwich at work and a breakfast sandwich in the morning. He has been taking two stool softeners from [facility] for years.  He underwent a partial colectomy approximately 30 years ago due to diverticulitis, with hospitalization for high fever and abdominal pain resulting in the removal of two feet of his colon. A colonoscopy last year revealed a small polyp, with a recommendation to return in five years.  No fever, vomiting, diarrhea, or blood in stool. He has been using prune juice recently to manage symptoms. He notes a burning  sensation in his face and feels as though his blood pressure is elevated. He is currently taking 7.5 mg hydrocodone  for pain, which he believes is adequately managing his pain.        Review of Systems  Objective    BP (!) 138/93 (BP Location: Right Arm, Cuff Size: Normal)   Pulse 71   Temp 97.6 F (36.4 C) (Oral)   Ht 5' 8 (1.727 m)   Wt 199 lb (90.3 kg)   SpO2 100%   BMI 30.26 kg/m  Physical Exam Vitals reviewed.  Constitutional:      General: He is not in acute distress.    Appearance: Normal appearance. He is not diaphoretic.  HENT:     Head: Normocephalic and atraumatic.  Eyes:     General: No scleral icterus.    Conjunctiva/sclera: Conjunctivae normal.  Cardiovascular:     Rate and Rhythm: Normal rate and regular rhythm.     Heart sounds: Normal heart sounds. No murmur heard. Pulmonary:     Effort: Pulmonary effort is normal. No respiratory distress.     Breath sounds: Normal breath sounds. No wheezing or rhonchi.  Abdominal:     General: Bowel sounds are normal. There is no distension.     Palpations: Abdomen is soft.     Tenderness: There is abdominal tenderness (LLQ). There is no guarding or rebound.     Hernia: A hernia (ventral and diastasis rectus) is present.  Musculoskeletal:     Cervical back: Neck supple.  Right lower leg: No edema.     Left lower leg: No edema.  Lymphadenopathy:     Cervical: No cervical adenopathy.  Skin:    General: Skin is warm and dry.     Findings: No rash.  Neurological:     Mental Status: He is alert and oriented to person, place, and time. Mental status is at baseline.  Psychiatric:        Mood and Affect: Mood normal.        Behavior: Behavior normal.       No results found for any visits on 08/30/23.  Assessment & Plan     Problem List Items Addressed This Visit   None Visit Diagnoses       Left lower quadrant abdominal pain    -  Primary   Relevant Orders   CT ABDOMEN PELVIS W CONTRAST     History of  colonic diverticulitis         History of partial colectomy               Constipation with left lower quadrant abdominal pain and history of diverticulitis with partial colectomy Chronic constipation with recent exacerbation causing significant left lower quadrant abdominal pain. Differential includes recurrent diverticulitis versus severe constipation. No fever or vomiting, reducing likelihood of diverticulitis. Recent colonoscopy showed only a small polyp, reducing concern for malignancy. Physical exam reveals tenderness in the left lower quadrant but no signs of acute obstruction. Bowel sounds present, indicating no blockage. He is anxious about potential recurrence of diverticulitis. - Order CT scan of the abdomen to evaluate for diverticulitis or other causes of abdominal pain - Initiate Miralax 1 capful daily to manage constipation, adjust dose based on response - Continue stool softeners as needed - Advise to use magnesium citrate only for severe constipation episodes - Discuss results of CT scan once available and initiate antibiotics if diverticulitis is confirmed  Hypertension Reports sensation of facial burning, possibly related to elevated blood pressure. Anxiety and pain may be contributing to elevated blood pressure. - Recheck blood pressure at the end of the visit after he has settled        No orders of the defined types were placed in this encounter.    Return if symptoms worsen or fail to improve.      Jon Eva, MD  [facility] [phone] (phone) [phone] (fax)  [facility]

## 2023-08-31 NOTE — Progress Notes (Signed)
 Pt left before the visit.

## 2023-09-05 DIAGNOSIS — G4733 Obstructive sleep apnea (adult) (pediatric): Secondary | ICD-10-CM | POA: Diagnosis not present

## 2023-09-08 ENCOUNTER — Other Ambulatory Visit: Payer: Self-pay | Admitting: Family Medicine

## 2023-09-08 ENCOUNTER — Other Ambulatory Visit: Payer: Self-pay | Admitting: Adult Health

## 2023-09-09 NOTE — Telephone Encounter (Signed)
 Requested Prescriptions  Pending Prescriptions Disp Refills   nortriptyline  (PAMELOR ) 25 MG capsule [Pharmacy Med Name: NORTRIPTYLINE  HCL 25 MG CAP 25 Capsule] 90 capsule 0    Sig: TAKE 1 CAPSULE BY MOUTH ONCE DAILY     Psychiatry:  Antidepressants - Heterocyclics (TCAs) Passed - 09/09/2023  8:11 AM      Passed - Completed PHQ-2 or PHQ-9 in the last 360 days      Passed - Valid encounter within last 6 months    Recent Outpatient Visits           1 week ago Left lower quadrant abdominal pain   [facility] [place], Jon HERO, MD   4 months ago Welcome to [employer] preventive visit   [facility], Jon HERO, MD       Future Appointments             In 1 month Bacigalupo, Jon HERO, MD [facility], [place]   In 2 months McGowan, Clotilda DELENA RIGGERS [facility] [place]   In 4 months Jackquline Sawyer, MD [facility] [place]

## 2023-09-12 ENCOUNTER — Other Ambulatory Visit: Payer: Self-pay | Admitting: Family Medicine

## 2023-09-12 DIAGNOSIS — M792 Neuralgia and neuritis, unspecified: Secondary | ICD-10-CM

## 2023-09-12 DIAGNOSIS — S8411XS Injury of peroneal nerve at lower leg level, right leg, sequela: Secondary | ICD-10-CM

## 2023-09-12 DIAGNOSIS — G894 Chronic pain syndrome: Secondary | ICD-10-CM

## 2023-09-12 NOTE — Telephone Encounter (Unsigned)
 Copied from CRM [id number]. Topic: Clinical - Medication Refill >> Sep 12, 2023  2:31 PM [name] wrote: Medication: pregabalin  (LYRICA ) 200 MG capsule  Has the patient contacted their pharmacy? Yes (Agent: If no, request that the patient contact the pharmacy for the refill. If patient does not wish to contact the pharmacy document the reason why and proceed with request.) (Agent: If yes, when and what did the pharmacy advise?) Pharmacy needs order to refill  This is the patient's preferred pharmacy:   [id number] [place], [place] - [address] AT [facility] [address] [place] [address] Phone: [phone] Fax: [phone]  Is this the correct pharmacy for this prescription? Yes If no, delete pharmacy and type the correct one.   Has the prescription been filled recently? No  Is the patient out of the medication? No - He will be out of medication tomorrow  Has the patient been seen for an appointment in the last year OR does the patient have an upcoming appointment? Yes  Can we respond through MyChart? Yes  Agent: Please be advised that Rx refills may take up to 3 business days. We ask that you follow-up with your pharmacy.

## 2023-09-13 ENCOUNTER — Other Ambulatory Visit: Payer: Self-pay | Admitting: Adult Health

## 2023-09-13 ENCOUNTER — Other Ambulatory Visit: Payer: Self-pay | Admitting: Family Medicine

## 2023-09-13 DIAGNOSIS — G8929 Other chronic pain: Secondary | ICD-10-CM

## 2023-09-13 NOTE — Telephone Encounter (Signed)
 Copied from CRM [id number]. Topic: Clinical - Medication Refill >> Sep 13, 2023  5:03 PM Rilla B wrote: Medication: umeclidinium-vilanterol (ANORO ELLIPTA ) 62.5-25 MCG/ACT AEPB  Has the patient contacted their pharmacy? Yes (Agent: If no, request that the patient contact the pharmacy for the refill. If patient does not wish to contact the pharmacy document the reason why and proceed with request.) (Agent: If yes, when and what did the pharmacy advise?)  This is the patient's preferred pharmacy:  [facility], [place] - [address] [place] [address] Phone: [phone] Fax: [phone]   Is this the correct pharmacy for this prescription? Yes If no, delete pharmacy and type the correct one.   Has the prescription been filled recently? Yes  Is the patient out of the medication? Yes  Has the patient been seen for an appointment in the last year OR does the patient have an upcoming appointment? Yes  Can we respond through MyChart? No  Agent: Please be advised that Rx refills may take up to 3 business days. We ask that you follow-up with your pharmacy.

## 2023-09-13 NOTE — Telephone Encounter (Signed)
 Requested medication (s) are due for refill today:   Provider to review  Requested medication (s) are on the active medication list:   Yes  Future visit scheduled:   Yes 10/20 Dr. Myrla    Last ordered: 06/15/2023 #90, 2 refills    Non delegated refill    Requested Prescriptions  Pending Prescriptions Disp Refills   pregabalin  (LYRICA ) 200 MG capsule 90 capsule 2     Not Delegated - Neurology:  Anticonvulsants - Controlled - pregabalin  Failed - 09/13/2023  3:37 PM      Failed - This refill cannot be delegated      Passed - Cr in normal range and within 360 days    Creat  Date Value Ref Range Status  10/21/2016 0.98 0.70 - 1.33 mg/dL Final    Comment:    For patients >59 years of age, the reference limit for Creatinine is approximately 13% higher for people identified as African-American. .    Creatinine, Ser  Date Value Ref Range Status  04/21/2023 0.83 0.76 - 1.27 mg/dL Final         Passed - Completed PHQ-2 or PHQ-9 in the last 360 days      Passed - Valid encounter within last 12 months    Recent Outpatient Visits           2 weeks ago Left lower quadrant abdominal pain   [facility] [place], Jon HERO, MD   4 months ago Welcome to [employer] preventive visit   [facility] [place], Jon HERO, MD       Future Appointments             In 1 month Bacigalupo, Jon HERO, MD [facility], [place]   In 2 months McGowan, Clotilda DELENA RIGGERS [facility] [place]   In 4 months Jackquline Sawyer, MD [facility] [place] [facility]

## 2023-09-13 NOTE — Telephone Encounter (Unsigned)
 Copied from CRM [id number]. Topic: Clinical - Medication Refill >> Sep 13, 2023  5:07 PM Everette C wrote: Medication: methocarbamol  (ROBAXIN ) 750 MG tablet  Has the patient contacted their pharmacy? Yes (Agent: If no, request that the patient contact the pharmacy for the refill. If patient does not wish to contact the pharmacy document the reason why and proceed with request.) (Agent: If yes, when and what did the pharmacy advise?)  This is the patient's preferred pharmacy:  [facility], [place] - [address] [place] [address] Phone: [phone] Fax: [phone]   Is this the correct pharmacy for this prescription? Yes If no, delete pharmacy and type the correct one.   Has the prescription been filled recently? Yes  Is the patient out of the medication? Yes  Has the patient been seen for an appointment in the last year OR does the patient have an upcoming appointment? Yes  Can we respond through MyChart? No  Agent: Please be advised that Rx refills may take up to 3 business days. We ask that you follow-up with your pharmacy.

## 2023-09-14 ENCOUNTER — Other Ambulatory Visit: Payer: Self-pay | Admitting: Family Medicine

## 2023-09-14 DIAGNOSIS — M792 Neuralgia and neuritis, unspecified: Secondary | ICD-10-CM

## 2023-09-14 DIAGNOSIS — S8411XS Injury of peroneal nerve at lower leg level, right leg, sequela: Secondary | ICD-10-CM

## 2023-09-14 DIAGNOSIS — G894 Chronic pain syndrome: Secondary | ICD-10-CM

## 2023-09-15 DIAGNOSIS — G8929 Other chronic pain: Secondary | ICD-10-CM | POA: Diagnosis not present

## 2023-09-15 DIAGNOSIS — M79671 Pain in right foot: Secondary | ICD-10-CM | POA: Diagnosis not present

## 2023-09-15 DIAGNOSIS — R7302 Impaired glucose tolerance (oral): Secondary | ICD-10-CM | POA: Diagnosis not present

## 2023-09-15 LAB — LAB REPORT - SCANNED
Albumin, Urine POC: 17.4
Albumin/Creatinine Ratio, Urine, POC: 15
Creatinine, POC: 115.7 mg/dL

## 2023-09-15 MED ORDER — PREGABALIN 200 MG PO CAPS: 200.0000 mg | ORAL_CAPSULE | Freq: Three times a day (TID) | ORAL | 2 refills | Status: DC

## 2023-09-15 MED ORDER — METHOCARBAMOL 750 MG PO TABS: 750.0000 mg | ORAL_TABLET | Freq: Three times a day (TID) | ORAL | 3 refills | Status: AC

## 2023-09-15 NOTE — Telephone Encounter (Signed)
 Requested medication (s) are due for refill today: yes  Requested medication (s) are on the active medication list: yes  Last refill:  04/14/23  Future visit scheduled: yes  Notes to clinic:  Unable to refill per protocol, cannot delegate.      Requested Prescriptions  Pending Prescriptions Disp Refills   methocarbamol  (ROBAXIN ) 750 MG tablet 270 tablet 3    Sig: Take 1 tablet (750 mg total) by mouth 3 (three) times daily.     Not Delegated - Analgesics:  Muscle Relaxants Failed - 09/15/2023  9:33 AM      Failed - This refill cannot be delegated      Passed - Valid encounter within last 6 months    Recent Outpatient Visits           2 weeks ago Left lower quadrant abdominal pain   [facility] [place], Jon HERO, MD   4 months ago [facility] [employer] preventive visit   [facility] [place], Jon HERO, MD       Future Appointments             In 1 month Bacigalupo, Jon HERO, MD [facility]   In 2 months McGowan, Clotilda DELENA RIGGERS [facility] [place]   In 4 months Jackquline Sawyer, MD [facility]

## 2023-10-07 ENCOUNTER — Other Ambulatory Visit: Payer: Self-pay | Admitting: Family Medicine

## 2023-10-10 ENCOUNTER — Other Ambulatory Visit: Payer: Self-pay | Admitting: Family Medicine

## 2023-10-10 DIAGNOSIS — G4733 Obstructive sleep apnea (adult) (pediatric): Secondary | ICD-10-CM | POA: Diagnosis not present

## 2023-10-11 DIAGNOSIS — G4733 Obstructive sleep apnea (adult) (pediatric): Secondary | ICD-10-CM | POA: Diagnosis not present

## 2023-10-13 DIAGNOSIS — F17210 Nicotine dependence, cigarettes, uncomplicated: Secondary | ICD-10-CM | POA: Diagnosis not present

## 2023-10-13 DIAGNOSIS — M79671 Pain in right foot: Secondary | ICD-10-CM | POA: Diagnosis not present

## 2023-10-13 DIAGNOSIS — R7302 Impaired glucose tolerance (oral): Secondary | ICD-10-CM | POA: Diagnosis not present

## 2023-10-13 DIAGNOSIS — G8929 Other chronic pain: Secondary | ICD-10-CM | POA: Diagnosis not present

## 2023-10-24 ENCOUNTER — Encounter: Payer: Self-pay | Admitting: Family Medicine

## 2023-10-24 ENCOUNTER — Telehealth: Payer: Self-pay | Admitting: Adult Health

## 2023-10-24 ENCOUNTER — Ambulatory Visit (INDEPENDENT_AMBULATORY_CARE_PROVIDER_SITE_OTHER): Admitting: Family Medicine

## 2023-10-24 VITALS — BP 106/66 | HR 74 | Ht 68.0 in | Wt 193.8 lb

## 2023-10-24 DIAGNOSIS — Z09 Encounter for follow-up examination after completed treatment for conditions other than malignant neoplasm: Secondary | ICD-10-CM | POA: Diagnosis not present

## 2023-10-24 DIAGNOSIS — I10 Essential (primary) hypertension: Secondary | ICD-10-CM | POA: Diagnosis not present

## 2023-10-24 DIAGNOSIS — I25118 Atherosclerotic heart disease of native coronary artery with other forms of angina pectoris: Secondary | ICD-10-CM | POA: Diagnosis not present

## 2023-10-24 DIAGNOSIS — R7303 Prediabetes: Secondary | ICD-10-CM | POA: Diagnosis not present

## 2023-10-24 DIAGNOSIS — Z720 Tobacco use: Secondary | ICD-10-CM | POA: Diagnosis not present

## 2023-10-24 DIAGNOSIS — E663 Overweight: Secondary | ICD-10-CM

## 2023-10-24 DIAGNOSIS — G894 Chronic pain syndrome: Secondary | ICD-10-CM | POA: Diagnosis not present

## 2023-10-24 DIAGNOSIS — G4733 Obstructive sleep apnea (adult) (pediatric): Secondary | ICD-10-CM | POA: Diagnosis not present

## 2023-10-24 DIAGNOSIS — J449 Chronic obstructive pulmonary disease, unspecified: Secondary | ICD-10-CM

## 2023-10-24 NOTE — Assessment & Plan Note (Signed)
 Blood pressure is well-controlled, contributing to overall cardiovascular health.

## 2023-10-24 NOTE — Assessment & Plan Note (Signed)
 Planning to f/u with Pulm
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
## 2023-10-24 NOTE — Telephone Encounter (Signed)
 No ov >1 year , will need ov can work in my same slot next week or 2 if needed otherwise can order at [facility] ov
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
## 2023-10-24 NOTE — Assessment & Plan Note (Signed)
 Coronary artery disease with h/o myocardial infarction and two stents, under good medical management with cardiology follow-up. Blood pressure is well-controlled. On aspirin  and Imdur  for angina management. - Continue current medical management for CAD - Maintain follow-up with cardiology

## 2023-10-24 NOTE — Assessment & Plan Note (Signed)
 Actively working on smoking cessation. Successfully quit for six weeks previously and has resumed efforts to quit smoking, motivated by health and personal reasons. - Support smoking cessation efforts

## 2023-10-24 NOTE — Telephone Encounter (Signed)
 Please advise if okay to place bipap order with settings,patient has appointment in December has not been seen since June 2024

## 2023-10-24 NOTE — Assessment & Plan Note (Signed)
 Chronic obstructive pulmonary disease managed with inhalers. Reports good breathing control. Recently decided to quit smoking, which should positively impact COPD management. - Continue inhaler use for COPD - Encourage smoking cessation efforts

## 2023-10-24 NOTE — Telephone Encounter (Signed)
 Copied from CRM #[id number]. Topic: Clinical - Order For Equipment >> Oct 24, 2023  1:43 PM Celestine F wrote: Reason for CRM: Pt is requesting a DME to [employer] for a new BIPAP machine due to it being more than 66 years old. Pt stated [employer] told him he qualified for a new machine, but needed NP Tammy Parrett to place an order.   Pt scheduled an appt in Dec of 2025 with NP Tammy Parrett in case she needs to see him before placing the order.  Pt's phone number is [phone] ok to leave a vm.  Need order for BIPAP

## 2023-10-24 NOTE — Assessment & Plan Note (Signed)
 Recently started on metformin by pain management clinic due to prediabetes diagnosis. No significant gastrointestinal side effects reported. Awaiting further evaluation of A1c to assess metformin efficacy. - Monitor A1c in three months to evaluate metformin efficacy - Obtain recent lab results from pain management clinic

## 2023-10-25 NOTE — Telephone Encounter (Signed)
 Patient's appt moved up to 10/30/205. Patient was notified. NFN

## 2023-10-27 ENCOUNTER — Ambulatory Visit: Admitting: General Surgery

## 2023-10-27 ENCOUNTER — Encounter: Payer: Self-pay | Admitting: General Surgery

## 2023-11-03 ENCOUNTER — Ambulatory Visit (INDEPENDENT_AMBULATORY_CARE_PROVIDER_SITE_OTHER): Admitting: Adult Health

## 2023-11-03 ENCOUNTER — Encounter: Payer: Self-pay | Admitting: Adult Health

## 2023-11-03 VITALS — BP 118/68 | HR 78 | Temp 97.5°F | Ht 68.0 in | Wt 195.8 lb

## 2023-11-03 DIAGNOSIS — F17210 Nicotine dependence, cigarettes, uncomplicated: Secondary | ICD-10-CM | POA: Diagnosis not present

## 2023-11-03 DIAGNOSIS — G4733 Obstructive sleep apnea (adult) (pediatric): Secondary | ICD-10-CM

## 2023-11-03 DIAGNOSIS — J449 Chronic obstructive pulmonary disease, unspecified: Secondary | ICD-10-CM

## 2023-11-03 DIAGNOSIS — Z720 Tobacco use: Secondary | ICD-10-CM
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
## 2023-11-03 NOTE — Patient Instructions (Addendum)
 Continue on ANORO 1 puff daily  Albuterol  inhaler As needed   saline nasal spray 2 puffs Twice daily   saline nasal gel At bedtime  .   Wear BIPAP At bedtime  -wear at least 6hrs each night  Order for new bipap.  Do not drive if sleepy  Work on healthy weight  Do not drive if sleepy.   Continue on Lung cancer CT chest screening   Follow up with Dr. Aleen or Gaelan Glennon NP in 3 months with PFT and As needed
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
## 2023-11-28 ENCOUNTER — Encounter: Payer: Self-pay | Admitting: Urology

## 2023-11-28 ENCOUNTER — Ambulatory Visit: Payer: Self-pay | Admitting: Urology

## 2023-11-28 VITALS — BP 132/83 | HR 72 | Wt 195.0 lb

## 2023-11-28 DIAGNOSIS — N200 Calculus of kidney: Secondary | ICD-10-CM

## 2023-11-28 DIAGNOSIS — N138 Other obstructive and reflux uropathy: Secondary | ICD-10-CM

## 2023-11-28 DIAGNOSIS — N401 Enlarged prostate with lower urinary tract symptoms: Secondary | ICD-10-CM | POA: Diagnosis not present

## 2023-11-28 DIAGNOSIS — N529 Male erectile dysfunction, unspecified: Secondary | ICD-10-CM | POA: Diagnosis not present

## 2023-12-06 ENCOUNTER — Ambulatory Visit: Admitting: Adult Health

## 2023-12-08 ENCOUNTER — Other Ambulatory Visit: Payer: Self-pay | Admitting: Family Medicine

## 2023-12-09 NOTE — Telephone Encounter (Signed)
 Please review in provider's absence.  Thank you.   LOV- 10/24/2023 NOV- 04/23/2024 LRF- 09/09/2023 Outpatient Medication Detail   Disp Refills Start End   nortriptyline  (PAMELOR ) 25 MG capsule 90 capsule 0 09/09/2023 --   Sig - Route: TAKE 1 CAPSULE BY MOUTH ONCE DAILY - Oral   Sent to pharmacy as: nortriptyline  (PAMELOR ) 25 MG capsule   E-Prescribing Status: Receipt confirmed by pharmacy (09/09/2023  8:13 AM EDT)

## 2023-12-13 NOTE — Progress Notes (Signed)
 This visit was cancelled.

## 2023-12-14 ENCOUNTER — Other Ambulatory Visit: Payer: Self-pay | Admitting: Family Medicine

## 2023-12-14 DIAGNOSIS — G894 Chronic pain syndrome: Secondary | ICD-10-CM

## 2023-12-14 DIAGNOSIS — M792 Neuralgia and neuritis, unspecified: Secondary | ICD-10-CM

## 2023-12-14 DIAGNOSIS — S8411XS Injury of peroneal nerve at lower leg level, right leg, sequela: Secondary | ICD-10-CM

## 2023-12-14 NOTE — Telephone Encounter (Unsigned)
 Copied from CRM #[id number]. Topic: Clinical - Medication Refill >> Dec 14, 2023 12:06 PM [name] wrote: Medication:  pregabalin  (LYRICA ) 200 MG capsule   Has the patient contacted their pharmacy? Yes (Agent: If no, request that the patient contact the pharmacy for the refill. If patient does not wish to contact the pharmacy document the reason why and proceed with request.) (Agent: If yes, when and what did the pharmacy advise?) Pharmacy needs order to refill  This is the patient's preferred pharmacy:   [id number] [place], [place] - [address] AT [facility] OF [address] [place] [address] Phone: [phone] Fax: [phone]  Is this the correct pharmacy for this prescription? Yes If no, delete pharmacy and type the correct one.   Has the prescription been filled recently? No  Is the patient out of the medication? Yes - He is out of medication today and needs 2 doses today.  Has the patient been seen for an appointment in the last year OR does the patient have an upcoming appointment? Yes  Can we respond through MyChart? No  Agent: Please be advised that Rx refills may take up to 3 business days. We ask that you follow-up with your pharmacy.

## 2023-12-16 ENCOUNTER — Ambulatory Visit: Payer: Self-pay

## 2023-12-16 ENCOUNTER — Observation Stay
Admission: EM | Admit: 2023-12-16 | Discharge: 2023-12-17 | Disposition: A | Attending: Internal Medicine | Admitting: Internal Medicine

## 2023-12-16 ENCOUNTER — Emergency Department

## 2023-12-16 ENCOUNTER — Other Ambulatory Visit: Payer: Self-pay

## 2023-12-16 DIAGNOSIS — G4733 Obstructive sleep apnea (adult) (pediatric): Secondary | ICD-10-CM | POA: Diagnosis present

## 2023-12-16 DIAGNOSIS — R911 Solitary pulmonary nodule: Secondary | ICD-10-CM | POA: Insufficient documentation

## 2023-12-16 DIAGNOSIS — R7303 Prediabetes: Secondary | ICD-10-CM | POA: Insufficient documentation

## 2023-12-16 DIAGNOSIS — J449 Chronic obstructive pulmonary disease, unspecified: Secondary | ICD-10-CM | POA: Diagnosis present

## 2023-12-16 DIAGNOSIS — I10 Essential (primary) hypertension: Secondary | ICD-10-CM | POA: Diagnosis present

## 2023-12-16 DIAGNOSIS — F331 Major depressive disorder, recurrent, moderate: Secondary | ICD-10-CM | POA: Diagnosis present

## 2023-12-16 DIAGNOSIS — F17210 Nicotine dependence, cigarettes, uncomplicated: Secondary | ICD-10-CM | POA: Insufficient documentation

## 2023-12-16 DIAGNOSIS — M51379 Other intervertebral disc degeneration, lumbosacral region without mention of lumbar back pain or lower extremity pain: Secondary | ICD-10-CM | POA: Diagnosis present

## 2023-12-16 DIAGNOSIS — I2699 Other pulmonary embolism without acute cor pulmonale: Principal | ICD-10-CM | POA: Diagnosis present

## 2023-12-16 DIAGNOSIS — E785 Hyperlipidemia, unspecified: Secondary | ICD-10-CM | POA: Diagnosis present

## 2023-12-16 DIAGNOSIS — F411 Generalized anxiety disorder: Secondary | ICD-10-CM | POA: Diagnosis present

## 2023-12-16 DIAGNOSIS — L4050 Arthropathic psoriasis, unspecified: Secondary | ICD-10-CM | POA: Diagnosis present

## 2023-12-16 DIAGNOSIS — Z79899 Other long term (current) drug therapy: Secondary | ICD-10-CM | POA: Insufficient documentation

## 2023-12-16 DIAGNOSIS — I2510 Atherosclerotic heart disease of native coronary artery without angina pectoris: Secondary | ICD-10-CM | POA: Insufficient documentation

## 2023-12-16 DIAGNOSIS — Z7982 Long term (current) use of aspirin: Secondary | ICD-10-CM | POA: Insufficient documentation

## 2023-12-16 DIAGNOSIS — F1090 Alcohol use, unspecified, uncomplicated: Secondary | ICD-10-CM | POA: Insufficient documentation

## 2023-12-16 LAB — CBC
HCT: 42.2 % (ref 39.0–52.0)
Hemoglobin: 14.2 g/dL (ref 13.0–17.0)
MCH: 32.2 pg (ref 26.0–34.0)
MCHC: 33.6 g/dL (ref 30.0–36.0)
MCV: 95.7 fL (ref 80.0–100.0)
Platelets: 234 K/uL (ref 150–400)
RBC: 4.41 MIL/uL (ref 4.22–5.81)
RDW: 14.6 % (ref 11.5–15.5)
WBC: 6.8 K/uL (ref 4.0–10.5)
nRBC: 0 % (ref 0.0–0.2)

## 2023-12-16 LAB — PROTIME-INR
INR: 1 (ref 0.8–1.2)
Prothrombin Time: 13.7 s (ref 11.4–15.2)

## 2023-12-16 LAB — BASIC METABOLIC PANEL WITH GFR
Anion gap: 15 (ref 5–15)
BUN: 20 mg/dL (ref 8–23)
CO2: 26 mmol/L (ref 22–32)
Calcium: 9.4 mg/dL (ref 8.9–10.3)
Chloride: 96 mmol/L — ABNORMAL LOW (ref 98–111)
Creatinine, Ser: 0.73 mg/dL (ref 0.61–1.24)
GFR, Estimated: >60 mL/min (ref >60)
Glucose, Bld: 81 mg/dL (ref 70–99)
Potassium: 3.6 mmol/L (ref 3.5–5.1)
Sodium: 137 mmol/L (ref 135–145)

## 2023-12-16 LAB — TROPONIN T, HIGH SENSITIVITY
Troponin T High Sensitivity: 23 ng/L — ABNORMAL HIGH (ref 0–19)
Troponin T High Sensitivity: 20 ng/L — ABNORMAL HIGH (ref 0–19)

## 2023-12-16 LAB — APTT: aPTT: 33 s (ref 24–36)

## 2023-12-16 MED ORDER — SENNOSIDES-DOCUSATE SODIUM 8.6-50 MG PO TABS: 1.0000 | ORAL_TABLET | Freq: Every evening | ORAL | Status: DC | PRN

## 2023-12-16 MED ORDER — HYDROCODONE-ACETAMINOPHEN 5-325 MG PO TABS: 1.0000 | ORAL_TABLET | ORAL | Status: DC | PRN

## 2023-12-16 MED ORDER — IOHEXOL 350 MG/ML SOLN
75.0000 mL | Freq: Once | INTRAVENOUS | Status: AC | PRN
  Administered 2023-12-16: 75 mL via INTRAVENOUS

## 2023-12-16 MED ORDER — ACETAMINOPHEN 650 MG RE SUPP: 650.0000 mg | Freq: Four times a day (QID) | RECTAL | Status: DC | PRN

## 2023-12-16 MED ORDER — METHOCARBAMOL 500 MG PO TABS
750.0000 mg | ORAL_TABLET | Freq: Three times a day (TID) | ORAL | Status: DC | PRN
  Administered 2023-12-16: 750 mg via ORAL
  Filled 2023-12-16: qty 2

## 2023-12-16 MED ORDER — HYDROCODONE-ACETAMINOPHEN 5-325 MG PO TABS
1.0000 | ORAL_TABLET | ORAL | Status: DC | PRN
  Administered 2023-12-16: 2 via ORAL
  Administered 2023-12-16 – 2023-12-17 (×2): 1 via ORAL
  Filled 2023-12-16: qty 2
  Filled 2023-12-16: qty 1
  Filled 2023-12-16: qty 2

## 2023-12-16 MED ORDER — NICOTINE 21 MG/24HR TD PT24
21.0000 mg | MEDICATED_PATCH | Freq: Once | TRANSDERMAL | Status: DC
  Filled 2023-12-16: qty 1

## 2023-12-16 MED ORDER — ROSUVASTATIN CALCIUM 10 MG PO TABS
40.0000 mg | ORAL_TABLET | Freq: Every day | ORAL | Status: DC
  Administered 2023-12-17: 40 mg via ORAL
  Filled 2023-12-16: qty 4

## 2023-12-16 MED ORDER — ACETAMINOPHEN 325 MG PO TABS: 650.0000 mg | ORAL_TABLET | Freq: Four times a day (QID) | ORAL | Status: DC | PRN

## 2023-12-16 MED ORDER — PREGABALIN 200 MG PO CAPS: 200.0000 mg | ORAL_CAPSULE | Freq: Every day | ORAL | 3 refills | Status: AC

## 2023-12-16 MED ORDER — ONDANSETRON HCL 4 MG/2ML IJ SOLN: 4.0000 mg | Freq: Four times a day (QID) | INTRAMUSCULAR | Status: DC | PRN

## 2023-12-16 MED ORDER — ONDANSETRON HCL 4 MG PO TABS: 4.0000 mg | ORAL_TABLET | Freq: Four times a day (QID) | ORAL | Status: DC | PRN

## 2023-12-16 MED ORDER — HEPARIN BOLUS VIA INFUSION
5350.0000 [IU] | Freq: Once | INTRAVENOUS | Status: AC
  Administered 2023-12-16: 5350 [IU] via INTRAVENOUS
  Filled 2023-12-16: qty 5350

## 2023-12-16 MED ORDER — ASPIRIN 81 MG PO TBEC
81.0000 mg | DELAYED_RELEASE_TABLET | Freq: Every day | ORAL | Status: DC
  Administered 2023-12-17: 81 mg via ORAL
  Filled 2023-12-16: qty 1

## 2023-12-16 MED ORDER — HEPARIN (PORCINE) 25000 UT/250ML-% IV SOLN
1400.0000 [IU]/h | INTRAVENOUS | Status: DC
  Administered 2023-12-16 – 2023-12-17 (×2): 1400 [IU]/h via INTRAVENOUS
  Filled 2023-12-16 (×2): qty 250

## 2023-12-16 MED ORDER — PREGABALIN 75 MG PO CAPS
200.0000 mg | ORAL_CAPSULE | Freq: Every day | ORAL | Status: DC
  Administered 2023-12-17: 200 mg via ORAL
  Filled 2023-12-16: qty 1

## 2023-12-16 MED ORDER — SODIUM CHLORIDE 0.9% FLUSH
3.0000 mL | Freq: Two times a day (BID) | INTRAVENOUS | Status: DC
  Administered 2023-12-17: 3 mL via INTRAVENOUS

## 2023-12-16 MED ORDER — NORTRIPTYLINE HCL 25 MG PO CAPS
25.0000 mg | ORAL_CAPSULE | Freq: Every day | ORAL | Status: DC
  Administered 2023-12-17: 25 mg via ORAL
  Filled 2023-12-16: qty 1

## 2023-12-16 MED ORDER — UMECLIDINIUM-VILANTEROL 62.5-25 MCG/ACT IN AEPB
1.0000 | INHALATION_SPRAY | Freq: Every day | RESPIRATORY_TRACT | Status: DC
  Administered 2023-12-17: 1 via RESPIRATORY_TRACT
  Filled 2023-12-16: qty 14

## 2023-12-16 MED ORDER — NICOTINE 21 MG/24HR TD PT24
21.0000 mg | MEDICATED_PATCH | Freq: Every day | TRANSDERMAL | Status: DC
  Administered 2023-12-16 – 2023-12-17 (×2): 21 mg via TRANSDERMAL
  Filled 2023-12-16: qty 1
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
## 2023-12-16 NOTE — Telephone Encounter (Signed)
 FYI Only or Action Required?: FYI only for provider: UC or ED recommended-patient verbalized understanding and states he will go.  Patient was last seen in primary care on 10/24/2023 by Myrla Jon HERO, MD.  Called Nurse Triage reporting Shortness of Breath.  Symptoms began yesterday.  Interventions attempted: Rest, hydration, or home remedies.  Symptoms are: unchanged.  Triage Disposition: Go to ED Now (Notify PCP)  Patient/caregiver understands and will follow disposition?: Yes  Copied from CRM #[id number]. Topic: Clinical - Red Word Triage >> Dec 16, 2023  8:32 AM Everette C wrote: [facility] that prompted transfer to Nurse Triage: The patient is experiencing respiratory discomfort and chest discomfort on their right side. Reason for Disposition  [1] MODERATE difficulty breathing (e.g., speaks in phrases, SOB even at rest, pulse 100-120) AND [2] NEW-onset or WORSE than normal  Answer Assessment - Initial Assessment Questions Patient reports shortness of breath and chest discomfort to his right side. Reports symptoms started last night. Moderate shortness of breath that differs from his baseline. Patient is recommended to UC or ED. Patient verbalized understanding and states he will go.   1. RESPIRATORY STATUS: Describe your breathing? (e.g., wheezing, shortness of breath, unable to speak, severe coughing)      Shortness of breath, chest discomfort 2. ONSET: When did this breathing problem begin?      Started last night when he came home from work 3. PATTERN Does the difficult breathing come and go, or has it been constant since it started?      constant 4. SEVERITY: How bad is your breathing? (e.g., mild, moderate, severe)      moderate 5. RECURRENT SYMPTOM: Have you had difficulty breathing before? If Yes, ask: When was the last time? and What happened that time?      yes 6. CARDIAC HISTORY: Do you have any history of heart disease? (e.g., heart attack, angina,  bypass surgery, angioplasty)      yes 7. LUNG HISTORY: Do you have any history of lung disease?  (e.g., pulmonary embolus, asthma, emphysema)     yes 8. CAUSE: What do you think is causing the breathing problem?      unsure 9. OTHER SYMPTOMS: Do you have any other symptoms? (e.g., chest pain, cough, dizziness, fever, runny nose)     Chest discomfort, cough 10. O2 SATURATION MONITOR:  Do you use an oxygen saturation monitor (pulse oximeter) at home? If Yes, ask: What is your reading (oxygen level) today? What is your usual oxygen saturation reading? (e.g., 95%)       Doesn't check his levels 11. Traveled out of the country in the last month? (e.g., travel history, exposures)       no  Protocols used: Breathing Difficulty-A-AH

## 2023-12-16 NOTE — Telephone Encounter (Signed)
 Patient call note and symptoms reviewed. Agree with ED rec

## 2023-12-16 NOTE — Telephone Encounter (Signed)
 Requested medication (s) are due for refill today - no  Requested medication (s) are on the active medication list -yes  Future visit scheduled -yes  Last refill: 12/14/23 #90 3RF  Notes to clinic: duplicate request, non delegated Rx  Requested Prescriptions  Pending Prescriptions Disp Refills   pregabalin  (LYRICA ) 200 MG capsule 90 capsule 3     Not Delegated - Neurology:  Anticonvulsants - Controlled - pregabalin  Failed - 12/16/2023 10:59 AM      Failed - This refill cannot be delegated      Passed - Cr in normal range and within 360 days    Creat  Date Value Ref Range Status  10/21/2016 0.98 0.70 - 1.33 mg/dL Final    Comment:    For patients >7 years of age, the reference limit for Creatinine is approximately 13% higher for people identified as African-American. .    Creatinine, Ser  Date Value Ref Range Status  04/21/2023 0.83 0.76 - 1.27 mg/dL Final   Creatinine, POC  Date Value Ref Range Status  09/15/2023 115.7 mg/dL Final    Comment:    Abstracted by HIM         Passed - Completed PHQ-2 or PHQ-9 in the last 360 days      Passed - Valid encounter within last 12 months    Recent Outpatient Visits           1 month ago Essential hypertension   [place] [facility] [place], Jon HERO, MD   3 months ago Left lower quadrant abdominal pain   [place] [facility] [place], Jon HERO, MD   7 months ago [facility] to [employer] preventive visit   [facility] [place], Jon HERO, MD       Future Appointments             In 1 month Jackquline Sawyer, MD [facility]               Requested Prescriptions  Pending Prescriptions Disp Refills   pregabalin  (LYRICA ) 200 MG capsule 90 capsule 3     Not Delegated - Neurology:  Anticonvulsants - Controlled - pregabalin  Failed - 12/16/2023 10:59 AM      Failed - This refill cannot be delegated      Passed - Cr in normal range  and within 360 days    Creat  Date Value Ref Range Status  10/21/2016 0.98 0.70 - 1.33 mg/dL Final    Comment:    For patients >66 years of age, the reference limit for Creatinine is approximately 13% higher for people identified as African-American. .    Creatinine, Ser  Date Value Ref Range Status  04/21/2023 0.83 0.76 - 1.27 mg/dL Final   Creatinine, POC  Date Value Ref Range Status  09/15/2023 115.7 mg/dL Final    Comment:    Abstracted by HIM         Passed - Completed PHQ-2 or PHQ-9 in the last 360 days      Passed - Valid encounter within last 12 months    Recent Outpatient Visits           1 month ago Essential hypertension   [facility] Myrla Jon HERO, MD   3 months ago Left lower quadrant abdominal pain   [place] [facility] [place], Jon HERO, MD   7 months ago [facility] preventive visit   [facility] [place], Jon HERO, MD       Future Appointments             In 1 month Jackquline Sawyer, MD [facility]

## 2023-12-16 NOTE — ED Triage Notes (Signed)
 C/O right sided chest pain since last evening.  Current sinus infection started last night.  Also cough productive for clear sputum.  AAOx3. Skin warm and dry. NAD. No SOB/ DOE

## 2023-12-16 NOTE — ED Notes (Signed)
 Called CCMD to add pt to monitoring.

## 2023-12-16 NOTE — ED Provider Notes (Signed)
 [facility] Provider Note    Event Date/Time   First MD Initiated Contact with Patient 12/16/23 1459     (approximate)   History   Chief Complaint Chest Pain   HPI  Caleb [name] is a 68 y.o. male with past medical history of hypertension, hyperlipidemia, CAD, COPD, and chronic pain syndrome who presents to the ED complaining of chest pain.  Patient reports that since about 9 PM last night he has been dealing with constant sharp pain in the right side of his chest.  He states the pain is worse when he takes a deep breath but he denies any shortness of breath.  He has had a dry cough recently but denies any fevers or chills.  He has not noticed any pain or swelling in his legs, denies history of similar symptoms.     Physical Exam   Triage Vital Signs: ED Triage Vitals  Encounter Vitals Group     BP 12/16/23 1238 116/68     Girls Systolic BP Percentile --      Girls Diastolic BP Percentile --      Boys Systolic BP Percentile --      Boys Diastolic BP Percentile --      Pulse Rate 12/16/23 1238 77     Resp 12/16/23 1238 19     Temp 12/16/23 1238 97.8 F (36.6 C)     Temp Source 12/16/23 1238 Oral     SpO2 12/16/23 1238 100 %     Weight 12/16/23 1241 195 lb (88.5 kg)     Height --      Head Circumference --      Peak Flow --      Pain Score 12/16/23 1240 3     Pain Loc --      Pain Education --      Exclude from Growth Chart --     Most recent vital signs: Vitals:   12/16/23 1707 12/16/23 1730  BP:  129/64  Pulse:  73  Resp:  20  Temp: 97.7 F (36.5 C)   SpO2:  93%    Constitutional: Alert and oriented. Eyes: Conjunctivae are normal. Head: Atraumatic. Nose: No congestion/rhinnorhea. Mouth/Throat: Mucous membranes are moist.  Cardiovascular: Normal rate, regular rhythm. Grossly normal heart sounds.  2+ radial pulses bilaterally. Respiratory: Normal respiratory effort.  No retractions. Lungs CTAB.  No chest wall tenderness to  palpation. Gastrointestinal: Soft and nontender. No distention. Musculoskeletal: No lower extremity tenderness nor edema.  Neurologic:  Normal speech and language. No gross focal neurologic deficits are appreciated.    ED Results / Procedures / Treatments   Labs (all labs ordered are listed, but only abnormal results are displayed) Labs Reviewed  BASIC METABOLIC PANEL WITH GFR - Abnormal; Notable for the following components:      Result Value   Chloride 96 (*)    All other components within normal limits  TROPONIN T, HIGH SENSITIVITY - Abnormal; Notable for the following components:   Troponin T High Sensitivity 23 (*)    All other components within normal limits  TROPONIN T, HIGH SENSITIVITY - Abnormal; Notable for the following components:   Troponin T High Sensitivity 20 (*)    All other components within normal limits  CBC  APTT  PROTIME-INR  HEPARIN  LEVEL (UNFRACTIONATED)  CBC     EKG  ED ECG REPORT I, Carlin Palin, the attending physician, personally viewed and interpreted this ECG.   Date: 12/16/2023  EKG  Time: 12:36  Rate: 80  Rhythm: normal sinus rhythm  Axis: LAD  Intervals:none  ST&T Change: Lateral T wave flattening, similar to previous.  RADIOLOGY Chest x-ray reviewed and interpreted by me with no infiltrate, edema, or effusion.  PROCEDURES:  Critical Care performed: Yes, see critical care procedure note(s)  .Critical Care  Performed by: Willo Dunnings, MD Authorized by: Willo Dunnings, MD   Critical care provider statement:    Critical care time (minutes):  30   Critical care time was exclusive of:  Separately billable procedures and treating other patients and teaching time   Critical care was necessary to treat or prevent imminent or life-threatening deterioration of the following conditions:  Cardiac failure   Critical care was time spent personally by me on the following activities:  Development of treatment plan with patient or  surrogate, discussions with consultants, evaluation of patient's response to treatment, examination of patient, ordering and review of laboratory studies, ordering and review of radiographic studies, ordering and performing treatments and interventions, pulse oximetry, re-evaluation of patient's condition and review of old charts   I assumed direction of critical care for this patient from another provider in my specialty: no     Care discussed with: admitting provider      MEDICATIONS ORDERED IN ED: Medications  heparin  ADULT infusion 100 units/mL (25000 units/250mL) (1,400 Units/hr Intravenous New Bag/Given 12/16/23 1731)  iohexol  (OMNIPAQUE ) 350 MG/ML injection 75 mL (75 mLs Intravenous Contrast Given 12/16/23 1615)  heparin  bolus via infusion 5,350 Units (5,350 Units Intravenous Bolus from Bag 12/16/23 1731)     IMPRESSION / MDM / ASSESSMENT AND PLAN / ED COURSE  I reviewed the triage vital signs and the nursing notes.                              68 y.o. male with past medical history of hypertension, hyperlipidemia, CAD, COPD, and chronic pain syndrome who presents to the ED complaining of sharp pleuritic right-sided chest pain since last night.  Patient's presentation is most consistent with acute presentation with potential threat to life or bodily function.  Differential diagnosis includes, but is not limited to, ACS, PE, pneumonia, pneumothorax, musculoskeletal pain, GERD, anxiety.  Patient nontoxic-appearing and in no acute distress, vital signs are unremarkable.  EKG shows no evidence of arrhythmia or ischemia, lateral T wave flattening are similar compared to previous with inferior Q waves similar to previous.  Symptoms atypical for ACS, more concerning for PE and we will further assess with CTA of his chest.  Chest x-ray is unremarkable, initial troponin very mildly elevated and will trend.  Additional labs without significant anemia, leukocytosis, electrolyte abnormality, or  AKI.  Repeat troponin is stable but CTA chest shows segmental pulmonary emboli in the right lower lobe, consistent with patient's symptoms.  He continues to decline pain medication and is hemodynamically stable.  Case discussed with hospitalist for admission.      FINAL CLINICAL IMPRESSION(S) / ED DIAGNOSES   Final diagnoses:  Acute pulmonary embolism without acute cor pulmonale, unspecified pulmonary embolism type (HCC)     Rx / DC Orders   ED Discharge Orders     None        Note:  This document was prepared using Dragon voice recognition software and may include unintentional dictation errors.   Willo Dunnings, MD 12/16/23 [phone]
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
## 2023-12-17 ENCOUNTER — Encounter: Payer: Self-pay | Admitting: Internal Medicine

## 2023-12-17 ENCOUNTER — Other Ambulatory Visit: Payer: Self-pay

## 2023-12-17 ENCOUNTER — Observation Stay
Admit: 2023-12-17 | Discharge: 2023-12-17 | Disposition: A | Discharge status: Home / Self Care | Attending: Internal Medicine | Admitting: Internal Medicine

## 2023-12-17 DIAGNOSIS — G4733 Obstructive sleep apnea (adult) (pediatric): Secondary | ICD-10-CM | POA: Diagnosis not present

## 2023-12-17 DIAGNOSIS — J449 Chronic obstructive pulmonary disease, unspecified: Secondary | ICD-10-CM

## 2023-12-17 DIAGNOSIS — F331 Major depressive disorder, recurrent, moderate: Secondary | ICD-10-CM | POA: Diagnosis not present

## 2023-12-17 DIAGNOSIS — I2699 Other pulmonary embolism without acute cor pulmonale: Secondary | ICD-10-CM | POA: Diagnosis not present

## 2023-12-17 DIAGNOSIS — I10 Essential (primary) hypertension: Secondary | ICD-10-CM

## 2023-12-17 DIAGNOSIS — I2609 Other pulmonary embolism with acute cor pulmonale: Secondary | ICD-10-CM | POA: Diagnosis not present

## 2023-12-17 LAB — CBC
HCT: 40.9 % (ref 39.0–52.0)
Hemoglobin: 14 g/dL (ref 13.0–17.0)
MCH: 32.1 pg (ref 26.0–34.0)
MCHC: 34.2 g/dL (ref 30.0–36.0)
MCV: 93.8 fL (ref 80.0–100.0)
Platelets: 210 K/uL (ref 150–400)
RBC: 4.36 MIL/uL (ref 4.22–5.81)
RDW: 14.5 % (ref 11.5–15.5)
WBC: 7.1 K/uL (ref 4.0–10.5)
nRBC: 0 % (ref 0.0–0.2)

## 2023-12-17 LAB — HEPARIN LEVEL (UNFRACTIONATED)
Heparin Unfractionated: 0.59 [IU]/mL (ref 0.30–0.70)
Heparin Unfractionated: 0.5 [IU]/mL (ref 0.30–0.70)

## 2023-12-17 LAB — BASIC METABOLIC PANEL WITH GFR
Anion gap: 8 (ref 5–15)
BUN: 19 mg/dL (ref 8–23)
CO2: 31 mmol/L (ref 22–32)
Calcium: 9.4 mg/dL (ref 8.9–10.3)
Chloride: 96 mmol/L — ABNORMAL LOW (ref 98–111)
Creatinine, Ser: 0.78 mg/dL (ref 0.61–1.24)
GFR, Estimated: >60 mL/min (ref >60)
Glucose, Bld: 99 mg/dL (ref 70–99)
Potassium: 4.1 mmol/L (ref 3.5–5.1)
Sodium: 134 mmol/L — ABNORMAL LOW (ref 135–145)

## 2023-12-17 LAB — ECHOCARDIOGRAM COMPLETE
AR max vel: 2.23 cm2
AV Peak grad: 7.2 mmHg
Ao pk vel: 1.34 m/s
Area-P 1/2: 2.81 cm2
Height: 68 in
S' Lateral: 4.3 cm
Weight: 3139.35 [oz_av]

## 2023-12-17 LAB — GLUCOSE, CAPILLARY: Glucose-Capillary: 101 mg/dL — ABNORMAL HIGH (ref 70–99)

## 2023-12-17 LAB — MAGNESIUM: Magnesium: 1.9 mg/dL (ref 1.7–2.4)

## 2023-12-17 LAB — HIV ANTIBODY (ROUTINE TESTING W REFLEX): HIV Screen 4th Generation wRfx: NONREACTIVE

## 2023-12-17 MED ORDER — APIXABAN 5 MG PO TABS
10.0000 mg | ORAL_TABLET | Freq: Two times a day (BID) | ORAL | Status: DC
  Administered 2023-12-17: 10 mg via ORAL
  Filled 2023-12-17: qty 2

## 2023-12-17 MED ORDER — APIXABAN (ELIQUIS) VTE STARTER PACK (10MG AND 5MG)
ORAL_TABLET | ORAL | 0 refills | Status: DC
  Filled 2023-12-17: qty 74, 30d supply, fill #0

## 2023-12-17 MED ORDER — APIXABAN 5 MG PO TABS
5.0000 mg | ORAL_TABLET | Freq: Two times a day (BID) | ORAL | 0 refills | Status: DC
  Filled 2023-12-17: qty 60, 30d supply, fill #0

## 2023-12-17 MED ORDER — PERFLUTREN LIPID MICROSPHERE
1.0000 mL | INTRAVENOUS | Status: AC | PRN
  Administered 2023-12-17: 6 mL via INTRAVENOUS

## 2023-12-17 MED ORDER — APIXABAN 5 MG PO TABS: 5.0000 mg | ORAL_TABLET | Freq: Two times a day (BID) | ORAL | Status: DC

## 2023-12-17 NOTE — Plan of Care (Signed)
  Problem: Clinical Measurements: Goal: Ability to maintain clinical measurements within normal limits will improve Outcome: Progressing   Problem: Pain Managment: Goal: General experience of comfort will improve and/or be controlled Outcome: Progressing   Problem: Safety: Goal: Ability to remain free from injury will improve Outcome: Progressing

## 2023-12-17 NOTE — Progress Notes (Signed)
°  Echocardiogram 2D Echocardiogram has been performed. Definity  IV ultrasound imaging agent used on this study.  [name] 12/17/2023, 9:41 AM

## 2023-12-17 NOTE — Plan of Care (Signed)
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
## 2023-12-17 NOTE — Discharge Summary (Signed)
 Physician Discharge Summary   Patient: Caleb [name] MRN: [id number] DOB: 1955/09/20  Admit date:     12/16/2023  Discharge date: 12/17/2023  Discharge Physician: Cresencio Fairly   PCP: Myrla Jon HERO, MD   Recommendations at discharge:   Follow-up with outpatient providers as requested  Discharge Diagnoses: Principal Problem:   Pulmonary embolism (HCC) Active Problems:   DDD (degenerative disc disease), lumbosacral   OSA treated with BiPAP   Essential hypertension   Hyperlipidemia   COPD (chronic obstructive pulmonary disease) (HCC)   Moderate episode of recurrent major depressive disorder (HCC)   GAD (generalized anxiety disorder)   Psoriatic arthritis [facility])  Hospital Course: Assessment and Plan:  68 y.o. year old male with medical history of HTN, HLD, COPD, OSA, and psoriathrtic arthritis presenting to the ED with right sided chest pain.   Pulmonary Embolism Pt with chest pain with imaging showing pulmonary embolism. No provoking factor identified. It is segmental.  - Initially treated with heparin  gtt and transition to DOAC/Eliquis  at discharge. Will need indefinite anticoagulation given unprovoked.  - Echo performed but pending read but patient is not interested in waiting any further and would like to go home. - His symptoms are much improved and is back to baseline with treatment.  His girlfriend is at bedside.  Both are agreeable with the discharge plan.  I have recommended him to stop using ibuprofen .  Patient shared that he no longer have been using ibuprofen  at home. -He understands risk and benefit of Eliquis  including high risk for bleed   Chronic Problems:  HTN: Stable. HLD: continue home statin COPD: continue home inhaler GAD: continue home med Psoriatic arthritis: on methotrexate on Wednesdays.  Prediabetes: Outpatient evaluation with PCP Tobacco use disorder: extensive counseling provided.  Pulmonary nodule: CT shows 6 mm pulmonary nodule that is  unchanged. Repeat CT recommended at 6-12 months. Discussed importance of tobacco cessation.  Outpatient follow-up with PCP and pulmonary.  He follows with [facility]       Disposition: Home Diet recommendation:  Carb modified diet DISCHARGE MEDICATION: Allergies as of 12/17/2023       Reactions   Lisinopril Cough   Oxycodone  Nausea And Vomiting        Medication List     STOP taking these medications    clobetasol  0.05 % external solution Commonly known as: TEMOVATE    hydrocortisone  2.5 % cream   ibuprofen  800 MG tablet Commonly known as: ADVIL    sildenafil  100 MG tablet Commonly known as: VIAGRA        TAKE these medications    albuterol  108 (90 Base) MCG/ACT inhaler Commonly known as: VENTOLIN  HFA INHALE 2 PUFFS BY MOUTH EVERY 6 HOURS AS NEEDED FOR SHORTNESS OF BREATH AND WHEEZING   amLODipine  2.5 MG tablet Commonly known as: NORVASC  TAKE 1 TABLET BY MOUTH ONCE DAILY   Eliquis  DVT/PE Starter Pack Generic drug: Apixaban  Starter Pack (10mg  and 5mg ) Take as directed on package: start with two-5mg  tablets twice daily for 7 days. On day 8, switch to one-5mg  tablet twice daily.   apixaban  5 MG Tabs tablet Commonly known as: ELIQUIS  Take 1 tablet (5 mg total) by mouth 2 (two) times daily. Start taking on: January 16, 2024   aspirin  EC 81 MG tablet Take 81 mg by mouth daily.   buPROPion  150 MG 24 hr tablet Commonly known as: WELLBUTRIN  XL Take 1 tablet (150 mg total) by mouth daily.   cetirizine  10 MG tablet Commonly known as: ZYRTEC  TAKE 1  TABLET BY MOUTH ONCE DAILY   Cosentyx  Sensoready (300 MG) 150 MG/ML Soaj Generic drug: Secukinumab  (300 MG Dose) INJECT 300 MG (2 PENS) SUBCUTANEOUSLY EVERY 4 WEEKS FOR MAINTENANCE   fluticasone  50 MCG/ACT nasal spray Commonly known as: FLONASE  INSTILL TWO (2) SPRAYS IN EACH NOSTRIL ONCE DAILY   hydrochlorothiazide  25 MG tablet Commonly known as: HYDRODIURIL  TAKE 1 TABLET BY MOUTH DAILY    HYDROcodone -acetaminophen  7.5-325 MG tablet Commonly known as: NORCO Take 1 tablet by mouth every 6 (six) hours.   isosorbide  mononitrate 30 MG 24 hr tablet Commonly known as: IMDUR  TAKE 1 TABLET BY MOUTH DAILY   lidocaine  5 % ointment Commonly known as: XYLOCAINE  Apply 1 Application topically as needed.   losartan  50 MG tablet Commonly known as: COZAAR  TAKE 1 TABLET BY MOUTH ONCE DAILY   MAGNESIA PO Take 1 tablet by mouth 3 (three) times daily.   Melatonin 10 MG Caps Take 20 mg by mouth at bedtime.   metFORMIN 850 MG tablet Commonly known as: GLUCOPHAGE Take 850 mg by mouth daily.   methocarbamol  750 MG tablet Commonly known as: ROBAXIN  Take 1 tablet (750 mg total) by mouth 3 (three) times daily.   methotrexate 2.5 MG tablet Commonly known as: RHEUMATREX Take 15 mg by mouth once a week.   montelukast  10 MG tablet Commonly known as: SINGULAIR  Take 10 mg by mouth at bedtime.   naloxone  4 MG/0.1ML Liqd nasal spray kit Commonly known as: NARCAN  Use in case of opioid overdose   nitroGLYCERIN  0.4 MG SL tablet Commonly known as: NITROSTAT  Place 1 tablet (0.4 mg total) under the tongue every 5 (five) minutes as needed for chest pain.   nortriptyline  25 MG capsule Commonly known as: PAMELOR  TAKE 1 CAPSULE BY MOUTH ONCE DAILY   pregabalin  200 MG capsule Commonly known as: LYRICA  Take 1 capsule (200 mg total) by mouth daily.   psyllium 0.52 g capsule Commonly known as: REGULOID Take 0.52 g by mouth in the morning and at bedtime.   rosuvastatin  40 MG tablet Commonly known as: CRESTOR  TAKE 1 TABLET BY MOUTH ONCE DAILY   triamcinolone  ointment 0.5 % Commonly known as: KENALOG  Apply topically.   TURMERIC PO Take 1 capsule by mouth daily.   umeclidinium-vilanterol 62.5-25 MCG/ACT Aepb Commonly known as: ANORO ELLIPTA  Inhale 1 puff into the lungs daily.        Follow-up Information     Bacigalupo, Jon HERO, MD. Schedule an appointment as soon as  possible for a visit in 3 day(s).   Specialty: Family Medicine Why: [facility] Discharge F/UP Contact information: [address] [place] [address] [phone]                Discharge Exam: Fredricka Weights   12/16/23 1241 12/17/23 0500  Weight: 88.5 kg 89 kg   Gen: NAD HENT: NCAT CV: normal heart sounds Lung: CTAB Abd: No TTP, normal bowel sounds MSK: No asymmetry, good bulk and tone Neuro: alert and oriented  Condition at discharge: good  The results of significant diagnostics from this hospitalization (including imaging, microbiology, ancillary and laboratory) are listed below for reference.   Imaging Studies: CT Angio Chest PE W/Cm &/Or Wo Cm Result Date: 12/16/2023 EXAM: CTA of the Chest with contrast for PE 12/16/2023 04:21:08 PM TECHNIQUE: CTA of the chest was performed after the administration of intravenous contrast. Multiplanar reformatted images are provided for review. MIP images are provided for review. Automated exposure control, iterative reconstruction, and/or weight based adjustment of  the mA/kV was utilized to reduce the radiation dose to as low as reasonably achievable. COMPARISON: CT chest 03/04/2023. CLINICAL HISTORY: Pulmonary embolism (PE) suspected, high prob. FINDINGS: PULMONARY ARTERIES: Pulmonary arteries are adequately opacified for evaluation. Right lower lobe segmental pulmonary emboli. No central pulmonary embolism identified. Main pulmonary artery is normal in caliber. MEDIASTINUM: The heart and pericardium demonstrate no acute abnormality. There is no acute abnormality of the thoracic aorta. LYMPH NODES: No mediastinal, hilar or axillary lymphadenopathy. LUNGS AND PLEURA: Chest calcifications. Moderate emphysema again seen. There is atelectasis in the lung bases. There is a peripheral nodular density in the right lower lobe measuring 6 mm, image 5/71, which is grossly unchanged. There is no pleural effusion or pneumothorax. UPPER  ABDOMEN: There is a punctate calcification in the left kidney. SOFT TISSUES AND BONES: There are degenerative changes of the cervical spine. No acute soft tissue abnormality. IMPRESSION: 1. Right lower lobe segmental pulmonary emboli. No central pulmonary embolism identified. 2. Moderate emphysema. Pulmonary emphysema is an independent risk factor for lung cancer. Recommend consideration for evaluation for a low-dose CT lung cancer screening program. 3. Peripheral 6 mm solid right lower lobe pulmonary nodule, grossly unchanged. Recommend non-contrast chest CT at 6-12 months, then consider an additional non-contrast chest CT at 18-24 months per Fleischner Society Guidelines. Electronically signed by: Greig Pique MD 12/16/2023 04:34 PM EST RP Workstation: [id number]   DG Chest 2 View Result Date: 12/16/2023 CLINICAL DATA:  Chest pain EXAM: CHEST - 2 VIEW COMPARISON:  April 26, 2022 FINDINGS: The heart size and mediastinal contours are within normal limits. Both lungs are clear. The visualized skeletal structures are unremarkable. IMPRESSION: No active cardiopulmonary disease. Electronically Signed   By: Lynwood Landy Raddle M.D.   On: 12/16/2023 13:36    Microbiology: Results for orders placed or performed in visit on 10/16/20  Microscopic Examination     Status: Abnormal   Collection Time: 10/16/20  9:02 AM   Urine  Result Value Ref Range Status   WBC, UA 0-5 0 - 5 /hpf Final   RBC, Urine None seen 0 - 2 /hpf Final   Epithelial Cells (non renal) None seen 0 - 10 /hpf Final   Mucus, UA Present (A) Not Estab. Final   Bacteria, UA None seen None seen/Few Final   *Note: Due to a large number of results and/or encounters for the requested time period, some results have not been displayed. A complete set of results can be found in Results Review.    Labs: CBC: Recent Labs  Lab 12/16/23 1317 12/17/23 0445  WBC 6.8 7.1  HGB 14.2 14.0  HCT 42.2 40.9  MCV 95.7 93.8  PLT 234 210   Basic Metabolic  Panel: Recent Labs  Lab 12/16/23 1317 12/17/23 0033 12/17/23 0445  NA 137  --  134*  K 3.6  --  4.1  CL 96*  --  96*  CO2 26  --  31  GLUCOSE 81  --  99  BUN 20  --  19  CREATININE 0.73  --  0.78  CALCIUM  9.4  --  9.4  MG  --  1.9  --    Liver Function Tests: No results for input(s): AST, ALT, ALKPHOS, BILITOT, PROT, ALBUMIN in the last 168 hours. CBG: Recent Labs  Lab 12/17/23 0842  GLUCAP 101*    Discharge time spent: greater than 30 minutes.  Signed: Cresencio Fairly, MD [facility] 12/17/2023

## 2023-12-19 ENCOUNTER — Telehealth: Payer: Self-pay

## 2023-12-19 ENCOUNTER — Ambulatory Visit: Payer: Self-pay | Admitting: Family Medicine

## 2023-12-19 NOTE — Transitions of Care (Post Inpatient/ED Visit) (Unsigned)
° °  12/19/2023  Name: Caleb [name] MRN: [id number] DOB: 1955/09/16  Today's TOC FU Call Status: Today's TOC FU Call Status:: Unsuccessful Call (1st Attempt) Unsuccessful Call (1st Attempt) Date: 12/19/23  Attempted to reach the patient regarding the most recent Inpatient/ED visit.  Follow Up Plan: Additional outreach attempts will be made to reach the patient to complete the Transitions of Care (Post Inpatient/ED visit) call.   Signature Julian Lemmings, LPN [facility] Nurse Health Advisor Direct Dial  [phone]

## 2023-12-21 NOTE — Transitions of Care (Post Inpatient/ED Visit) (Signed)
 12/21/2023  Name: Caleb [name] MRN: [id number] DOB: 11-16-1955  Today's TOC FU Call Status: Today's TOC FU Call Status:: Successful TOC FU Call Completed Unsuccessful Call (1st Attempt) Date: 12/19/23 [facility] FU Call Complete Date: 12/21/23  Patient's Name and Date of Birth confirmed. Name, DOB  Transition Care Management Follow-up Telephone Call Date of Discharge: 12/17/23 Discharge Facility: [facility]) Type of Discharge: Inpatient Admission Primary Inpatient Discharge Diagnosis:: PE How have you been since you were released from the hospital?: Better Any questions or concerns?: No  Items Reviewed: Did you receive and understand the discharge instructions provided?: Yes Medications obtained,verified, and reconciled?: Yes (Medications Reviewed) Any new allergies since your discharge?: No Dietary orders reviewed?: Yes Do you have support at home?: Yes People in Home [RPT]: significant other  Medications Reviewed Today: Medications Reviewed Today     Reviewed by Emmitt Pan, LPN (Licensed Practical Nurse) on 12/21/23 at 1100  Med List Status: <None>   Medication Order Taking? Sig Documenting Provider Last Dose Status Informant  albuterol  (VENTOLIN  HFA) 108 (90 Base) MCG/ACT inhaler [id number] Yes INHALE 2 PUFFS BY MOUTH EVERY 6 HOURS AS NEEDED FOR SHORTNESS OF BREATH AND WHEEZING Bacigalupo, Jon HERO, MD  Active Self  amLODipine  (NORVASC ) 2.5 MG tablet [id number] Yes TAKE 1 TABLET BY MOUTH ONCE DAILY Bacigalupo, Angela M, MD  Active Self  apixaban  (ELIQUIS ) 5 MG TABS tablet [id number] Yes Take 1 tablet (5 mg total) by mouth 2 (two) times daily. Maree Hue, MD  Active   APIXABAN  (ELIQUIS ) VTE STARTER PACK (10MG  AND 5MG ) [id number] Yes Take as directed on package: start with two-5mg  tablets twice daily for 7 days. On day 8, switch to one-5mg  tablet twice daily. Maree Hue, MD  Active   aspirin  EC 81 MG tablet [id number] Yes Take 81 mg by mouth  daily.  [provider]  Active Self  buPROPion  (WELLBUTRIN  XL) 150 MG 24 hr tablet [id number] Yes Take 1 tablet (150 mg total) by mouth daily. Bacigalupo, Angela M, MD  Active Self  cetirizine  (ZYRTEC ) 10 MG tablet [id number] Yes TAKE 1 TABLET BY MOUTH ONCE DAILY Bacigalupo, Angela M, MD  Active Self  COSENTYX  SENSOREADY, 300 MG, 150 MG/ML SOAJ [id number] Yes INJECT 300 MG (2 PENS) SUBCUTANEOUSLY EVERY 4 WEEKS FOR MAINTENANCE Jackquline Sawyer, MD  Active Self  fluticasone  (FLONASE ) 50 MCG/ACT nasal spray [id number] Yes INSTILL TWO (2) SPRAYS IN EACH NOSTRIL ONCE DAILY Bacigalupo, Angela M, MD  Active Self  hydrochlorothiazide  (HYDRODIURIL ) 25 MG tablet [id number] Yes TAKE 1 TABLET BY MOUTH DAILY Gollan, Timothy J, MD  Active Self  HYDROcodone -acetaminophen  (NORCO) 7.5-325 MG tablet [id number] Yes Take 1 tablet by mouth every 6 (six) hours. [provider]  Active Self  isosorbide  mononitrate (IMDUR ) 30 MG 24 hr tablet [id number] Yes TAKE 1 TABLET BY MOUTH DAILY Bacigalupo, Jon HERO, MD  Active Self  lidocaine  (XYLOCAINE ) 5 % ointment [id number] Yes Apply 1 Application topically as needed. Myrla Jon HERO, MD  Active Self  losartan  (COZAAR ) 50 MG tablet [id number] Yes TAKE 1 TABLET BY MOUTH ONCE DAILY Bacigalupo, Angela M, MD  Active Self  Magnesium Hydroxide (MAGNESIA PO) [id number] Yes Take 1 tablet by mouth 3 (three) times daily. [provider]  Active Self  Melatonin 10 MG CAPS [id number]  Take 20 mg by mouth at bedtime.  Patient not taking: Reported on 12/21/2023   [provider]  Active Self  metFORMIN (GLUCOPHAGE) 850 MG tablet [id number] Yes Take 850 mg by mouth daily.  [provider]  Active Self  methocarbamol  (ROBAXIN ) 750 MG tablet [id number] Yes Take 1 tablet (750 mg total) by mouth 3 (three) times daily. Myrla Jon HERO, MD  Active Self  methotrexate [facility]) 2.5 MG tablet [id number] Yes Take 15 mg by mouth once a week. [provider]  Active Self           Med Note [facility], ELIZABETH A   Fri Dec 16, 2023  5:58 PM) wednesday  montelukast  (SINGULAIR ) 10 MG tablet [id number] Yes Take 10 mg by mouth at bedtime. [provider]  Active Self  naloxone  (NARCAN ) nasal spray 4 mg/0.1 mL [id number] Yes Use in case of opioid overdose Myrla, Angela M, MD  Active Self  nitroGLYCERIN  (NITROSTAT ) 0.4 MG SL tablet [id number] Yes Place 1 tablet (0.4 mg total) under the tongue every 5 (five) minutes as needed for chest pain. Gollan, Timothy J, MD  Active Self  nortriptyline  (PAMELOR ) 25 MG capsule [id number] Yes TAKE 1 CAPSULE BY MOUTH ONCE DAILY Franchot Isaiah LABOR, MD  Active Self  pregabalin  (LYRICA ) 200 MG capsule [id number] Yes Take 1 capsule (200 mg total) by mouth daily. Myrla Jon HERO, MD  Active Self  psyllium (REGULOID) 0.52 g capsule [id number] Yes Take 0.52 g by mouth in the morning and at bedtime. [provider]  Active Self  rosuvastatin  (CRESTOR ) 40 MG tablet [id number] Yes TAKE 1 TABLET BY MOUTH ONCE DAILY Bacigalupo, Angela M, MD  Active Self  triamcinolone  ointment (KENALOG ) 0.5 % [id number] Yes Apply topically. [provider]  Active Self  TURMERIC PO [id number] Yes Take 1 capsule by mouth daily. [provider]  Active Self  umeclidinium-vilanterol (ANORO ELLIPTA ) 62.5-25 MCG/ACT AEPB [id number] Yes Inhale 1 puff into the lungs daily. Parrett, Madelin RAMAN, NP  Active Self  Med List Note Octavia Chrissie KANDICE OBIE 09/28/21 1341): UDS 07-27-21  MR 11-23-21 Medication agreement signed 10/19/2017 PA sent for hydrocodone  - apap 7.5 - 325 via medicaid healthy blue with records LP 09/25/223             Home Care and Equipment/Supplies: Were Home Health Services Ordered?: NA Any new equipment or medical supplies ordered?: NA  Functional Questionnaire: Do you need assistance with bathing/showering or dressing?: No Do you need assistance with meal preparation?: No Do you need assistance  with eating?: No Do you have difficulty maintaining continence: No Do you need assistance with getting out of bed/getting out of a chair/moving?: No Do you have difficulty managing or taking your medications?: No  Follow up appointments reviewed: PCP Follow-up appointment confirmed?: Yes Date of PCP follow-up appointment?: 12/27/23 (patient was scheduled by hospital. no avail appts. sent message to staff to schedule) Follow-up Provider: [facility] Follow-up appointment confirmed?: Yes Date of Specialist follow-up appointment?: 12/23/23 Follow-Up Specialty Provider:: cardio Do you need transportation to your follow-up appointment?: No Do you understand care options if your condition(s) worsen?: Yes-patient verbalized understanding    SIGNATURE Julian Lemmings, LPN [facility] Nurse Health Advisor Direct Dial  [phone]

## 2023-12-23 ENCOUNTER — Ambulatory Visit: Attending: Nurse Practitioner | Admitting: Nurse Practitioner

## 2023-12-23 ENCOUNTER — Encounter: Payer: Self-pay | Admitting: Nurse Practitioner

## 2023-12-23 VITALS — BP 90/60 | HR 77 | Ht 68.0 in | Wt 197.2 lb

## 2023-12-23 DIAGNOSIS — I25119 Atherosclerotic heart disease of native coronary artery with unspecified angina pectoris: Secondary | ICD-10-CM

## 2023-12-23 DIAGNOSIS — E785 Hyperlipidemia, unspecified: Secondary | ICD-10-CM | POA: Diagnosis not present

## 2023-12-23 DIAGNOSIS — I10 Essential (primary) hypertension: Secondary | ICD-10-CM

## 2023-12-23 DIAGNOSIS — J449 Chronic obstructive pulmonary disease, unspecified: Secondary | ICD-10-CM

## 2023-12-23 DIAGNOSIS — I2699 Other pulmonary embolism without acute cor pulmonale: Secondary | ICD-10-CM | POA: Diagnosis not present

## 2023-12-23 DIAGNOSIS — I255 Ischemic cardiomyopathy: Secondary | ICD-10-CM | POA: Diagnosis not present

## 2023-12-23 DIAGNOSIS — Z720 Tobacco use: Secondary | ICD-10-CM | POA: Diagnosis not present

## 2023-12-23 DIAGNOSIS — I5020 Unspecified systolic (congestive) heart failure: Secondary | ICD-10-CM | POA: Diagnosis not present

## 2023-12-23 NOTE — Patient Instructions (Signed)
 Medication Instructions:  Your physician recommends that you continue on your current medications as directed. Please refer to the Current Medication list given to you today.    *If you need a refill on your cardiac medications before your next appointment, please call your pharmacy*  Lab Work: No labs ordered today    Testing/Procedures: No test ordered today   Follow-Up: At [facility], you and your health needs are our priority.  As part of our continuing mission to provide you with exceptional heart care, our providers are all part of one team.  This team includes your primary Cardiologist (physician) and Advanced Practice Providers or APPs (Physician Assistants and Nurse Practitioners) who all work together to provide you with the care you need, when you need it.  Your next appointment:   6 month(s)  Provider:   Timothy Gollan, MD or Lonni Meager, NP
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
## 2023-12-25 NOTE — Progress Notes (Signed)
 " Established patient visit  Patient: Caleb [name]   DOB: Oct 02, 1955   68 y.o. Male  MRN: [id number] Visit Date: 12/27/2023  Today's healthcare provider: Jolynn Spencer, PA-C   Chief Complaint  Patient presents with   Hospitalization Follow-up    ER f/u 12/16/23 - reports doing better since leaving hospital. Currently fighting a cold that he got while being at hospital. Pt saw Cardiology Dr 12/19. Was advised to f/u with PCP   Subjective     HPI     Hospitalization Follow-up    Additional comments: ER f/u 12/16/23 - reports doing better since leaving hospital. Currently fighting a cold that he got while being at hospital. Pt saw Cardiology Dr 12/19. Was advised to f/u with PCP      Last edited by Wilfred Hargis RAMAN, CMA on 12/27/2023 10:12 AM.       Discussed the use of AI scribe software for clinical note transcription with the patient, who gave verbal consent to proceed.  History of Present Illness Caleb [name] is a 68 year old male who presents for a follow-up visit after receiving an epidural injection in the neck.  He is recently diagnosed with an unprovoked blood clot and is taking apixaban  5 mg twice daily as prescribed.  He reports a prior episode of severe post-work pain that worsened with heat and made breathing difficult, which led him to seek emergency care the next morning.  He takes Crestor , amlodipine , hydrochlorothiazide , Imdur , losartan , metformin, nitroglycerin , nortriptyline , Wellbutrin , and a tapering course of prednisone . His medications are organized into morning, lunch, and night packs, and he takes them as directed.  He has psoriatic arthritis managed by rheumatology, on methotrexate weekly and Cosentyx  injections twice monthly, with significant hand involvement that sometimes limits his ability to hold objects.  He is prediabetic and watching his diet. He is trying to quit smoking and uses Wellbutrin  for cessation.  He currently has no chest pain,  shortness of breath, visual changes, bowel or urinary problems, or swelling.  Follow up Hospitalization  Patient was admitted to [facility] on 12/16/23 and discharged on 12/17/23. He was treated for Pulmonary embolus. Treatment for this included heparin  gtt and transition to DOAC?Eliquis . Telephone follow up was done on 12/21/23 He reports fair compliance with treatment. He reports this condition is improved.  Per chart review, pat was seen by Cardiology on 12/21/23, Echo was performed On 12/23/23, pt was seen by pain management.    - Echo performed but pending read but patient is not interested in waiting any further and would like to go home. - His symptoms are much improved and is back to baseline with treatment.  His girlfriend is at bedside.  Both are agreeable with the discharge plan.  I have recommended him to stop using ibuprofen .  Patient shared that he no longer have been using ibuprofen  at home. -He understands risk and benefit of Eliquis  including high risk for bleed   Chronic Problems:  HTN: Stable. HLD: continue home statin COPD: continue home inhaler GAD: continue home med Psoriatic arthritis: on methotrexate on Wednesdays.  Prediabetes: Outpatient evaluation with PCP Tobacco use disorder: extensive counseling provided.  Pulmonary nodule: CT shows 6 mm pulmonary nodule that is unchanged. Repeat CT recommended at 6-12 months. Discussed importance of tobacco cessation.  Outpatient follow-up with PCP and pulmonary.  He follows with [facility]           Disposition: Home Diet recommendation:  Carb modified diet DISCHARGE MEDICAT  12/26/2023    8:53 AM 10/24/2023    9:40 AM 04/21/2023    1:41 PM  Depression screen PHQ 2/9  Decreased Interest 0 0 0  Down, Depressed, Hopeless 0 0 0  PHQ - 2 Score 0 0 0  Altered sleeping  0   Tired, decreased energy  0   Change in appetite  0   Feeling bad or failure about yourself   0   Trouble concentrating  0    Moving slowly or fidgety/restless  0   Suicidal thoughts  0   PHQ-9 Score  0    Difficult doing work/chores  Not difficult at all      Data saved with a previous flowsheet row definition      12/21/2022    1:18 PM 10/21/2022   10:56 AM 06/15/2018    9:03 AM 03/08/2018    8:26 AM  GAD 7 : Generalized Anxiety Score  Nervous, Anxious, on Edge 0 0 0 0  Control/stop worrying 0 0 0 0  Worry too much - different things 0 0 0 0  Trouble relaxing 0 0 0 0  Restless 1 0 0 0  Easily annoyed or irritable 3 0 1 0  Afraid - awful might happen 0 0 0 0  Total GAD 7 Score 4 0 1 0  Anxiety Difficulty Somewhat difficult Not difficult at all Not difficult at all Not difficult at all    Medications: Show/hide medication list[1]  Review of Systems All negative Except see HPI       Objective    BP 114/72   Pulse 70   Resp 14   Ht 5' 8 (1.727 m)   Wt 198 lb 6.4 oz (90 kg)   SpO2 98%   BMI 30.17 kg/m     Physical Exam Vitals reviewed.  Constitutional:      General: He is not in acute distress.    Appearance: Normal appearance. He is not diaphoretic.  HENT:     Head: Normocephalic and atraumatic.  Eyes:     General: No scleral icterus.    Conjunctiva/sclera: Conjunctivae normal.  Cardiovascular:     Rate and Rhythm: Normal rate and regular rhythm.     Pulses: Normal pulses.     Heart sounds: Normal heart sounds. No murmur heard. Pulmonary:     Effort: Pulmonary effort is normal. No respiratory distress.     Breath sounds: Normal breath sounds. No wheezing or rhonchi.  Musculoskeletal:     Cervical back: Neck supple.     Right lower leg: No edema.     Left lower leg: No edema.  Lymphadenopathy:     Cervical: No cervical adenopathy.  Skin:    General: Skin is warm and dry.     Findings: No rash.  Neurological:     Mental Status: He is alert and oriented to person, place, and time. Mental status is at baseline.  Psychiatric:        Mood and Affect: Mood normal.         Behavior: Behavior normal.      No results found for any visits on 12/27/23.      Assessment & Plan Hospital discharge FOLLOW-UP Acute pulmonary embolism  DDD (degenerative disc disease), lumbosacral   OSA treated with BiPAP   Essential hypertension   Hyperlipidemia   COPD (chronic obstructive pulmonary disease) (HCC)   Moderate episode of recurrent major depressive disorder (HCC)   GAD (generalized anxiety disorder)   Psoriatic  arthritis (HCC)  Acute pulmonary embolism Recent diagnosis with ongoing anticoagulation therapy. Unprovoked nature complicates management decisions, particularly regarding temporary cessation of anticoagulation for procedures. - Continue apixaban  5 mg twice daily indefinitely. - Consulted with cardiologist regarding management of anticoagulation therapy. Will follow-up  Essential hypertension Chronic and stable hypertension, management is crucial for overall cardiovascular health. - Continue current antihypertensive medications including amlodipine  2.5mg  and losartan  50mg . - Monitor blood pressure daily. Will follow-up  Pure hypercholesterolemia Chronic and stable Cholesterol management is essential to reduce cardiovascular risk. - Continue Crestor  40mg   daily for cholesterol management. Continue low cholesterol diet and daily exercise Will follow-up  Coronary artery disease with stable angina Chronic and stable - Continue current medications including Imdur  30mg  and nitroglycerin  25 mg as needed. Will follow-up  Psoriatic arthritis Chronic and stable Managed with methotrexate and Cosentyx . Symptoms primarily affect the hands, causing significant functional impairment. - Continue methotrexate and Cosentyx  as prescribed by rheumatologist. Will follow-up  Major depressive disorder, recurrent, moderate Chronic and stable Managed with Wellbutrin  150mg , which also aids in smoking cessation. - Continue Wellbutrin  for depression and smoking  cessation. Will follow-up  Prediabetes Chronic and stable Managed with metformin 850mg . Emphasis on maintaining blood glucose levels to prevent progression to diabetes. - Continue metformin. - Monitor diet and maintain blood glucose levels. Will follow-up  Tobacco use disorder Chronic Cessation was strongly encouraged  Pulmonary nodule Recent CT shows 6 mm pulmonary nodule, unchanged. Repeat CT recommended at 6-12 months.  Managed with [facility]  No orders of the defined types were placed in this encounter.   No follow-ups on file.   The patient was advised to call back or seek an in-person evaluation if the symptoms worsen or if the condition fails to improve as anticipated.  I discussed the assessment and treatment plan with the patient. The patient was provided an opportunity to ask questions and all were answered. The patient agreed with the plan and demonstrated an understanding of the instructions.  I, Quintana Canelo, PA-C have reviewed all documentation for this visit. The documentation on 12/27/2023  for the exam, diagnosis, procedures, and orders are all accurate and complete.  Jolynn Spencer, [facility], MMS [facility] [phone] (phone) [phone] (fax)  [facility]     [1]  Outpatient Medications Prior to Visit  Medication Sig   albuterol  (VENTOLIN  HFA) 108 (90 Base) MCG/ACT inhaler INHALE 2 PUFFS BY MOUTH EVERY 6 HOURS AS NEEDED FOR SHORTNESS OF BREATH AND WHEEZING   amLODipine  (NORVASC ) 2.5 MG tablet TAKE 1 TABLET BY MOUTH ONCE DAILY   [START ON 01/16/2024] apixaban  (ELIQUIS ) 5 MG TABS tablet Take 1 tablet (5 mg total) by mouth 2 (two) times daily.   APIXABAN  (ELIQUIS ) VTE STARTER PACK (10MG  AND 5MG ) Take as directed on package: start with two-5mg  tablets twice daily for 7 days. On day 8, switch to one-5mg  tablet twice daily.   aspirin  EC 81 MG tablet Take 81 mg by mouth daily.    cetirizine  (ZYRTEC ) 10 MG tablet TAKE 1 TABLET  BY MOUTH ONCE DAILY   COSENTYX  SENSOREADY, 300 MG, 150 MG/ML SOAJ INJECT 300 MG (2 PENS) SUBCUTANEOUSLY EVERY 4 WEEKS FOR MAINTENANCE   fluticasone  (FLONASE ) 50 MCG/ACT nasal spray INSTILL TWO (2) SPRAYS IN EACH NOSTRIL ONCE DAILY   hydrochlorothiazide  (HYDRODIURIL ) 25 MG tablet TAKE 1 TABLET BY MOUTH DAILY   HYDROcodone -acetaminophen  (NORCO) 7.5-325 MG tablet Take 1 tablet by mouth every 6 (six) hours.   isosorbide  mononitrate (IMDUR ) 30 MG 24 hr  tablet TAKE 1 TABLET BY MOUTH DAILY   lidocaine  (XYLOCAINE ) 5 % ointment Apply 1 Application topically as needed.   losartan  (COZAAR ) 50 MG tablet TAKE 1 TABLET BY MOUTH ONCE DAILY   Magnesium Hydroxide (MAGNESIA PO) Take 1 tablet by mouth 3 (three) times daily.   Melatonin 10 MG CAPS Take 20 mg by mouth at bedtime.   metFORMIN (GLUCOPHAGE) 850 MG tablet Take 850 mg by mouth daily.   methocarbamol  (ROBAXIN ) 750 MG tablet Take 1 tablet (750 mg total) by mouth 3 (three) times daily.   methotrexate (RHEUMATREX) 2.5 MG tablet Take 15 mg by mouth once a week.   montelukast  (SINGULAIR ) 10 MG tablet Take 10 mg by mouth at bedtime.   naloxone  (NARCAN ) nasal spray 4 mg/0.1 mL Use in case of opioid overdose   nitroGLYCERIN  (NITROSTAT ) 0.4 MG SL tablet Place 1 tablet (0.4 mg total) under the tongue every 5 (five) minutes as needed for chest pain.   nortriptyline  (PAMELOR ) 25 MG capsule TAKE 1 CAPSULE BY MOUTH ONCE DAILY   predniSONE  (DELTASONE ) 20 MG tablet Take 3 tablets (60 mg total) by mouth daily with breakfast for 3 days, THEN 2 tablets (40 mg total) daily with breakfast for 3 days, THEN 1 tablet (20 mg total) daily with breakfast for 3 days.   pregabalin  (LYRICA ) 200 MG capsule Take 1 capsule (200 mg total) by mouth daily.   psyllium (REGULOID) 0.52 g capsule Take 0.52 g by mouth in the morning and at bedtime.   rosuvastatin  (CRESTOR ) 40 MG tablet TAKE 1 TABLET BY MOUTH ONCE DAILY   triamcinolone  ointment (KENALOG ) 0.5 % Apply topically.   TURMERIC PO Take  1 capsule by mouth daily.   umeclidinium-vilanterol (ANORO ELLIPTA ) 62.5-25 MCG/ACT AEPB Inhale 1 puff into the lungs daily.   [DISCONTINUED] buPROPion  (WELLBUTRIN  XL) 150 MG 24 hr tablet Take 1 tablet (150 mg total) by mouth daily.   No facility-administered medications prior to visit.   "

## 2023-12-26 ENCOUNTER — Telehealth: Payer: Self-pay

## 2023-12-26 ENCOUNTER — Ambulatory Visit (HOSPITAL_BASED_OUTPATIENT_CLINIC_OR_DEPARTMENT_OTHER)

## 2023-12-26 ENCOUNTER — Ambulatory Visit: Attending: Pain Medicine | Admitting: Pain Medicine

## 2023-12-26 ENCOUNTER — Encounter: Payer: Self-pay | Admitting: Pain Medicine

## 2023-12-26 VITALS — BP 122/85 | HR 80 | Temp 97.4°F | Resp 16 | Ht 68.0 in | Wt 194.0 lb

## 2023-12-26 DIAGNOSIS — M79602 Pain in left arm: Secondary | ICD-10-CM | POA: Insufficient documentation

## 2023-12-26 DIAGNOSIS — Z7901 Long term (current) use of anticoagulants: Secondary | ICD-10-CM | POA: Insufficient documentation

## 2023-12-26 DIAGNOSIS — M792 Neuralgia and neuritis, unspecified: Secondary | ICD-10-CM | POA: Diagnosis present

## 2023-12-26 DIAGNOSIS — Z09 Encounter for follow-up examination after completed treatment for conditions other than malignant neoplasm: Secondary | ICD-10-CM | POA: Insufficient documentation

## 2023-12-26 DIAGNOSIS — S8411XS Injury of peroneal nerve at lower leg level, right leg, sequela: Secondary | ICD-10-CM | POA: Insufficient documentation

## 2023-12-26 DIAGNOSIS — M5010 Cervical disc disorder with radiculopathy, unspecified cervical region: Secondary | ICD-10-CM | POA: Insufficient documentation

## 2023-12-26 DIAGNOSIS — M4802 Spinal stenosis, cervical region: Secondary | ICD-10-CM | POA: Diagnosis present

## 2023-12-26 DIAGNOSIS — G5731 Lesion of lateral popliteal nerve, right lower limb: Secondary | ICD-10-CM | POA: Insufficient documentation

## 2023-12-26 DIAGNOSIS — Z86711 Personal history of pulmonary embolism: Secondary | ICD-10-CM | POA: Diagnosis present

## 2023-12-26 DIAGNOSIS — M25571 Pain in right ankle and joints of right foot: Secondary | ICD-10-CM | POA: Diagnosis present

## 2023-12-26 DIAGNOSIS — R29898 Other symptoms and signs involving the musculoskeletal system: Secondary | ICD-10-CM | POA: Diagnosis present

## 2023-12-26 DIAGNOSIS — G8929 Other chronic pain: Secondary | ICD-10-CM | POA: Diagnosis present

## 2023-12-26 DIAGNOSIS — M79671 Pain in right foot: Secondary | ICD-10-CM | POA: Diagnosis present

## 2023-12-26 DIAGNOSIS — M5412 Radiculopathy, cervical region: Secondary | ICD-10-CM | POA: Insufficient documentation

## 2023-12-26 DIAGNOSIS — M542 Cervicalgia: Secondary | ICD-10-CM | POA: Insufficient documentation

## 2023-12-26 MED ORDER — PREDNISONE 20 MG PO TABS: ORAL_TABLET | ORAL | 0 refills | Status: AC

## 2023-12-27 ENCOUNTER — Other Ambulatory Visit: Payer: Self-pay | Admitting: Family Medicine

## 2023-12-27 ENCOUNTER — Ambulatory Visit: Admitting: Physician Assistant

## 2023-12-27 VITALS — BP 114/72 | HR 70 | Resp 14 | Ht 68.0 in | Wt 198.4 lb

## 2023-12-27 DIAGNOSIS — Z09 Encounter for follow-up examination after completed treatment for conditions other than malignant neoplasm: Secondary | ICD-10-CM

## 2023-12-27 DIAGNOSIS — L4050 Arthropathic psoriasis, unspecified: Secondary | ICD-10-CM | POA: Diagnosis not present

## 2023-12-27 DIAGNOSIS — M51379 Other intervertebral disc degeneration, lumbosacral region without mention of lumbar back pain or lower extremity pain: Secondary | ICD-10-CM

## 2023-12-27 DIAGNOSIS — E7800 Pure hypercholesterolemia, unspecified: Secondary | ICD-10-CM

## 2023-12-27 DIAGNOSIS — G4733 Obstructive sleep apnea (adult) (pediatric): Secondary | ICD-10-CM

## 2023-12-27 DIAGNOSIS — I10 Essential (primary) hypertension: Secondary | ICD-10-CM | POA: Diagnosis not present

## 2023-12-27 DIAGNOSIS — F331 Major depressive disorder, recurrent, moderate: Secondary | ICD-10-CM | POA: Diagnosis not present

## 2023-12-27 DIAGNOSIS — Z720 Tobacco use: Secondary | ICD-10-CM

## 2023-12-27 DIAGNOSIS — R911 Solitary pulmonary nodule: Secondary | ICD-10-CM

## 2023-12-27 DIAGNOSIS — I2699 Other pulmonary embolism without acute cor pulmonale: Secondary | ICD-10-CM | POA: Diagnosis not present

## 2023-12-27 DIAGNOSIS — J449 Chronic obstructive pulmonary disease, unspecified: Secondary | ICD-10-CM | POA: Diagnosis not present

## 2023-12-27 DIAGNOSIS — I25118 Atherosclerotic heart disease of native coronary artery with other forms of angina pectoris: Secondary | ICD-10-CM

## 2023-12-28 NOTE — Progress Notes (Incomplete)
 " Established patient visit  Patient: Caleb [name]   DOB: Jun 03, 1955   68 y.o. Male  MRN: [id number] Visit Date: 12/27/2023  Today's healthcare provider: Jolynn Spencer, PA-C   Chief Complaint  Patient presents with   Hospitalization Follow-up    ER f/u 12/16/23 - reports doing better since leaving hospital. Currently fighting a cold that he got while being at hospital. Pt saw Cardiology Dr 12/19. Was advised to f/u with PCP   Subjective     HPI     Hospitalization Follow-up    Additional comments: ER f/u 12/16/23 - reports doing better since leaving hospital. Currently fighting a cold that he got while being at hospital. Pt saw Cardiology Dr 12/19. Was advised to f/u with PCP      Last edited by Wilfred Hargis RAMAN, CMA on 12/27/2023 10:12 AM.       Discussed the use of AI scribe software for clinical note transcription with the patient, who gave verbal consent to proceed.  History of Present Illness Caleb [name] is a 68 year old male who presents for a follow-up visit after receiving an epidural injection in the neck.  He is recently diagnosed with an unprovoked blood clot and is taking apixaban  5 mg twice daily as prescribed.  He reports a prior episode of severe post-work pain that worsened with heat and made breathing difficult, which led him to seek emergency care the next morning.  He takes Crestor , amlodipine , hydrochlorothiazide , Imdur , losartan , metformin, nitroglycerin , nortriptyline , Wellbutrin , and a tapering course of prednisone . His medications are organized into morning, lunch, and night packs, and he takes them as directed.  He has psoriatic arthritis managed by rheumatology, on methotrexate weekly and Cosentyx  injections twice monthly, with significant hand involvement that sometimes limits his ability to hold objects.  He is prediabetic and watching his diet. He is trying to quit smoking and uses Wellbutrin  for cessation.  He currently has no chest pain,  shortness of breath, visual changes, bowel or urinary problems, or swelling.  Follow up Hospitalization  Patient was admitted to [facility] on 12/16/23 and discharged on 12/17/23. He was treated for Pulmonary embolus. Treatment for this included heparin  gtt and transition to DOAC?Eliquis . Telephone follow up was done on 12/21/23 He reports fair compliance with treatment. He reports this condition is improved.  Per chart review, pat was seen by Cardiology on 12/21/23, Echo was performed On 12/23/23, pt was seen by pain management.    - Echo performed but pending read but patient is not interested in waiting any further and would like to go home. - His symptoms are much improved and is back to baseline with treatment.  His girlfriend is at bedside.  Both are agreeable with the discharge plan.  I have recommended him to stop using ibuprofen .  Patient shared that he no longer have been using ibuprofen  at home. -He understands risk and benefit of Eliquis  including high risk for bleed   Chronic Problems:  HTN: Stable. HLD: continue home statin COPD: continue home inhaler GAD: continue home med Psoriatic arthritis: on methotrexate on Wednesdays.  Prediabetes: Outpatient evaluation with PCP Tobacco use disorder: extensive counseling provided.  Pulmonary nodule: CT shows 6 mm pulmonary nodule that is unchanged. Repeat CT recommended at 6-12 months. Discussed importance of tobacco cessation.  Outpatient follow-up with PCP and pulmonary.  He follows with [place] [facility]           Disposition: Home Diet recommendation:  Carb modified diet DISCHARGE MEDICAT  12/26/2023    8:53 AM 10/24/2023    9:40 AM 04/21/2023    1:41 PM  Depression screen PHQ 2/9  Decreased Interest 0 0 0  Down, Depressed, Hopeless 0 0 0  PHQ - 2 Score 0 0 0  Altered sleeping  0   Tired, decreased energy  0   Change in appetite  0   Feeling bad or failure about yourself   0   Trouble concentrating  0    Moving slowly or fidgety/restless  0   Suicidal thoughts  0   PHQ-9 Score  0    Difficult doing work/chores  Not difficult at all      Data saved with a previous flowsheet row definition      12/21/2022    1:18 PM 10/21/2022   10:56 AM 06/15/2018    9:03 AM 03/08/2018    8:26 AM  GAD 7 : Generalized Anxiety Score  Nervous, Anxious, on Edge 0 0 0 0  Control/stop worrying 0 0 0 0  Worry too much - different things 0 0 0 0  Trouble relaxing 0 0 0 0  Restless 1 0 0 0  Easily annoyed or irritable 3 0 1 0  Afraid - awful might happen 0 0 0 0  Total GAD 7 Score 4 0 1 0  Anxiety Difficulty Somewhat difficult Not difficult at all Not difficult at all Not difficult at all    Medications: Show/hide medication list[1]  Review of Systems All negative Except see HPI   {Insert previous labs (optional):23779} {See past labs  Heme  Chem  Endocrine  Serology  Results Review (optional):1}   Objective    BP 114/72   Pulse 70   Resp 14   Ht 5' 8 (1.727 m)   Wt 198 lb 6.4 oz (90 kg)   SpO2 98%   BMI 30.17 kg/m  {Insert last BP/Wt (optional):23777}{See vitals history (optional):1}   Physical Exam Vitals reviewed.  Constitutional:      General: He is not in acute distress.    Appearance: Normal appearance. He is not diaphoretic.  HENT:     Head: Normocephalic and atraumatic.  Eyes:     General: No scleral icterus.    Conjunctiva/sclera: Conjunctivae normal.  Cardiovascular:     Rate and Rhythm: Normal rate and regular rhythm.     Pulses: Normal pulses.     Heart sounds: Normal heart sounds. No murmur heard. Pulmonary:     Effort: Pulmonary effort is normal. No respiratory distress.     Breath sounds: Normal breath sounds. No wheezing or rhonchi.  Musculoskeletal:     Cervical back: Neck supple.     Right lower leg: No edema.     Left lower leg: No edema.  Lymphadenopathy:     Cervical: No cervical adenopathy.  Skin:    General: Skin is warm and dry.     Findings:  No rash.  Neurological:     Mental Status: He is alert and oriented to person, place, and time. Mental status is at baseline.  Psychiatric:        Mood and Affect: Mood normal.        Behavior: Behavior normal.      No results found for any visits on 12/27/23.      Assessment & Plan Acute pulmonary embolism Recent diagnosis with ongoing anticoagulation therapy. Unprovoked nature complicates management decisions, particularly regarding temporary cessation of anticoagulation for procedures. - Continue apixaban  5 mg twice daily  indefinitely. - Consulted with cardiologist regarding management of anticoagulation therapy. Will follow-up  Essential hypertension Chronic and hypertension, management is crucial for overall cardiovascular health. - Continue current antihypertensive medications including amlodipine  2.5mg  and losartan  50mg . - Monitor blood pressure daily. Will follow-up  Pure hypercholesterolemia Chronic and stable Cholesterol management is essential to reduce cardiovascular risk. - Continue Crestor  40mg   daily for cholesterol management. Continue low cholesterol diet and daily exercise  Coronary artery disease with stable angina - Continue current medications including Imdur  and nitroglycerin  as needed.  Psoriatic arthritis Managed with methotrexate and Cosentyx . Symptoms primarily affect the hands, causing significant functional impairment. - Continue methotrexate and Cosentyx  as prescribed by rheumatologist.  Major depressive disorder, recurrent, moderate Managed with Wellbutrin , which also aids in smoking cessation. - Continue Wellbutrin  for depression and smoking cessation.  Prediabetes Managed with metformin. Emphasis on maintaining blood glucose levels to prevent progression to diabetes. - Continue metformin. - Monitor diet and maintain blood glucose levels.  Expand All Collapse All      Physician Discharge Summary    Patient: Caleb [name] MRN:  [id number] DOB: Nov 02, 1955  Admit date:     12/16/2023  Discharge date: 12/17/2023  Discharge Physician: Cresencio Fairly    PCP: Myrla Jon HERO, MD    Recommendations at discharge:    Follow-up with outpatient providers as requested   Discharge Diagnoses: Principal Problem:   Pulmonary embolism (HCC) Active Problems:   DDD (degenerative disc disease), lumbosacral   OSA treated with BiPAP   Essential hypertension   Hyperlipidemia   COPD (chronic obstructive pulmonary disease) (HCC)   Moderate episode of recurrent major depressive disorder (HCC)   GAD (generalized anxiety disorder)   Psoriatic arthritis [facility])   Hospital Course: Assessment and Plan:   68 y.o. year old male with medical history of HTN, HLD, COPD, OSA, and psoriathrtic arthritis presenting to the ED with right sided chest pain.    Pulmonary Embolism Pt with chest pain with imaging showing pulmonary embolism. No provoking factor identified. It is segmental.  - Initially treated with heparin  gtt and transition to DOAC/Eliquis  at discharge. Will need indefinite anticoagulation given unprovoked.  - Echo performed but pending read but patient is not interested in waiting any further and would like to go home. - His symptoms are much improved and is back to baseline with treatment.  His girlfriend is at bedside.  Both are agreeable with the discharge plan.  I have recommended him to stop using ibuprofen .  Patient shared that he no longer have been using ibuprofen  at home. -He understands risk and benefit of Eliquis  including high risk for bleed   Chronic Problems:  HTN: Stable. HLD: continue home statin COPD: continue home inhaler GAD: continue home med Psoriatic arthritis: on methotrexate on Wednesdays.  Prediabetes: Outpatient evaluation with PCP Tobacco use disorder: extensive counseling provided.  Pulmonary nodule: CT shows 6 mm pulmonary nodule that is unchanged. Repeat CT recommended at 6-12 months. Discussed  importance of tobacco cessation.  Outpatient follow-up with PCP and pulmonary.  He follows with [place] [facility]           Disposition: Home Diet recommendation:  Carb modified diet DISCHARGE MEDICAT      No orders of the defined types were placed in this encounter.   No follow-ups on file.   The patient was advised to call back or seek an in-person evaluation if the symptoms worsen or if the condition fails to improve as anticipated.  I discussed the assessment and treatment  plan with the patient. The patient was provided an opportunity to ask questions and all were answered. The patient agreed with the plan and demonstrated an understanding of the instructions.  I, Hiroko Tregre, PA-C have reviewed all documentation for this visit. The documentation on 12/27/2023  for the exam, diagnosis, procedures, and orders are all accurate and complete.  Jolynn Spencer, [facility], MMS [facility] [phone] (phone) [phone] (fax)  [facility]       [1] Outpatient Medications Prior to Visit  Medication Sig   albuterol  (VENTOLIN  HFA) 108 (90 Base) MCG/ACT inhaler INHALE 2 PUFFS BY MOUTH EVERY 6 HOURS AS NEEDED FOR SHORTNESS OF BREATH AND WHEEZING   amLODipine  (NORVASC ) 2.5 MG tablet TAKE 1 TABLET BY MOUTH ONCE DAILY   [START ON 01/16/2024] apixaban  (ELIQUIS ) 5 MG TABS tablet Take 1 tablet (5 mg total) by mouth 2 (two) times daily.   APIXABAN  (ELIQUIS ) VTE STARTER PACK (10MG  AND 5MG ) Take as directed on package: start with two-5mg  tablets twice daily for 7 days. On day 8, switch to one-5mg  tablet twice daily.   aspirin  EC 81 MG tablet Take 81 mg by mouth daily.    cetirizine  (ZYRTEC ) 10 MG tablet TAKE 1 TABLET BY MOUTH ONCE DAILY   COSENTYX  SENSOREADY, 300 MG, 150 MG/ML SOAJ INJECT 300 MG (2 PENS) SUBCUTANEOUSLY EVERY 4 WEEKS FOR MAINTENANCE   fluticasone  (FLONASE ) 50 MCG/ACT nasal spray INSTILL TWO (2) SPRAYS IN EACH NOSTRIL ONCE DAILY    hydrochlorothiazide  (HYDRODIURIL ) 25 MG tablet TAKE 1 TABLET BY MOUTH DAILY   HYDROcodone -acetaminophen  (NORCO) 7.5-325 MG tablet Take 1 tablet by mouth every 6 (six) hours.   isosorbide  mononitrate (IMDUR ) 30 MG 24 hr tablet TAKE 1 TABLET BY MOUTH DAILY   lidocaine  (XYLOCAINE ) 5 % ointment Apply 1 Application topically as needed.   losartan  (COZAAR ) 50 MG tablet TAKE 1 TABLET BY MOUTH ONCE DAILY   Magnesium Hydroxide (MAGNESIA PO) Take 1 tablet by mouth 3 (three) times daily.   Melatonin 10 MG CAPS Take 20 mg by mouth at bedtime.   metFORMIN (GLUCOPHAGE) 850 MG tablet Take 850 mg by mouth daily.   methocarbamol  (ROBAXIN ) 750 MG tablet Take 1 tablet (750 mg total) by mouth 3 (three) times daily.   methotrexate (RHEUMATREX) 2.5 MG tablet Take 15 mg by mouth once a week.   montelukast  (SINGULAIR ) 10 MG tablet Take 10 mg by mouth at bedtime.   naloxone  (NARCAN ) nasal spray 4 mg/0.1 mL Use in case of opioid overdose   nitroGLYCERIN  (NITROSTAT ) 0.4 MG SL tablet Place 1 tablet (0.4 mg total) under the tongue every 5 (five) minutes as needed for chest pain.   nortriptyline  (PAMELOR ) 25 MG capsule TAKE 1 CAPSULE BY MOUTH ONCE DAILY   predniSONE  (DELTASONE ) 20 MG tablet Take 3 tablets (60 mg total) by mouth daily with breakfast for 3 days, THEN 2 tablets (40 mg total) daily with breakfast for 3 days, THEN 1 tablet (20 mg total) daily with breakfast for 3 days.   pregabalin  (LYRICA ) 200 MG capsule Take 1 capsule (200 mg total) by mouth daily.   psyllium (REGULOID) 0.52 g capsule Take 0.52 g by mouth in the morning and at bedtime.   rosuvastatin  (CRESTOR ) 40 MG tablet TAKE 1 TABLET BY MOUTH ONCE DAILY   triamcinolone  ointment (KENALOG ) 0.5 % Apply topically.   TURMERIC PO Take 1 capsule by mouth daily.   umeclidinium-vilanterol (ANORO ELLIPTA ) 62.5-25 MCG/ACT AEPB Inhale 1 puff into the lungs daily.   [DISCONTINUED] buPROPion  (  WELLBUTRIN  XL) 150 MG 24 hr tablet Take 1 tablet (150 mg  total) by mouth daily.   No facility-administered medications prior to visit.  "

## 2023-12-29 ENCOUNTER — Encounter: Payer: Self-pay | Admitting: Physician Assistant

## 2024-01-02 ENCOUNTER — Inpatient Hospital Stay: Admitting: Family Medicine

## 2024-01-09 ENCOUNTER — Inpatient Hospital Stay: Admitting: Student in an Organized Health Care Education/Training Program

## 2024-01-12 ENCOUNTER — Other Ambulatory Visit: Payer: Self-pay

## 2024-01-12 DIAGNOSIS — I25118 Atherosclerotic heart disease of native coronary artery with other forms of angina pectoris: Secondary | ICD-10-CM

## 2024-01-12 MED ORDER — APIXABAN 5 MG PO TABS: 5.0000 mg | ORAL_TABLET | Freq: Two times a day (BID) | ORAL | 2 refills | Status: AC

## 2024-01-12 NOTE — Telephone Encounter (Signed)
 Please refill this for the pt    Reason for Triage: Patient states he was given Eliquis  while in the hospital in December and was told his PCP would pick up fills moving forward. Needing filled ASAP - today is the 2nd day without.    [id number] - [place], [place] - [address]

## 2024-01-13 ENCOUNTER — Ambulatory Visit: Payer: Self-pay

## 2024-01-13 NOTE — Telephone Encounter (Signed)
 FYI Only or Action Required?: Action required by provider: ER Refusal at this time.  Patient was last seen in primary care on 12/27/2023 by Ostwalt, Janna, PA-C.  Called Nurse Triage reporting Shortness of Breath.  Symptoms began several days ago.  Interventions attempted: Rest, hydration, or home remedies.  Symptoms are: gradually worsening.  Triage Disposition: Go to ED Now (Notify PCP)  Patient/caregiver understands and will follow disposition?: Unsure               Copied from CRM [id number]. Topic: Clinical - Red Word Triage >> Jan 13, 2024 12:31 PM Tinnie C wrote: Red Word that prompted transfer to Nurse Triage: Head and chest bothering him, hard to breath, thick green mucous. Would not like to go to hospital. Reason for Disposition  History of prior blood clot in leg or lungs (i.e., deep vein thrombosis, pulmonary embolism)  Answer Assessment - Initial Assessment Questions Symptoms started 3-4 days ago and  Coughing up thick green mucous Patient denies chest pain, chills or aching. Patient states this is starting to make him have shortness of breath--he states he doesn't feel like he can breathe deeply. Patient confirms having a history of a blood clot--he states that he has one right now He is advised that having new shortness of breath with the history of having a blood clot it is recommended that he goes to the Emergency Room. Patient states he has an appointment at a pain clinic that he cannot miss so he is going to go to that appointment first.  It is unclear if patient is going to go to the Emergency Room.  He is advised to call us  back with any further questions/concerns and if anything gets worse to call 911. Patient verbalized understanding.  Protocols used: Breathing Difficulty-A-AH

## 2024-01-13 NOTE — Telephone Encounter (Signed)
 Noted, I recommend patient go to the ED for shortness of breath.

## 2024-01-13 NOTE — Telephone Encounter (Signed)
 Called [facility] at 12:40pm to advise them of patient's symptoms and not going to the ER at this time---no answer at [facility]

## 2024-01-16 ENCOUNTER — Ambulatory Visit: Admitting: Dermatology

## 2024-01-16 ENCOUNTER — Ambulatory Visit: Payer: Self-pay

## 2024-01-16 ENCOUNTER — Telehealth: Payer: Self-pay

## 2024-01-16 NOTE — Telephone Encounter (Signed)
 FYI Only or Action Required?: Action required by provider: Pt refuses ED. Wants xray antibiotic and steroids.  Patient was last seen in primary care on 12/27/2023 by Ostwalt, Janna, PA-C.  Called Nurse Triage reporting Shortness of Breath. cough  Symptoms began a week ago.  Interventions attempted: Nothing.  Symptoms are: gradually worsening.  Triage Disposition: Go to ED Now (or PCP Triage) Called [facility] to report ED refusal. Patient/caregiver understands and will follow disposition?: no pt refuses ED - Was coughing up green - now sputum is clear.                     Copied from CRM [id number]. Topic: Clinical - Red Word Triage >> Jan 16, 2024  8:51 AM Caleb [name] wrote: Red Word that prompted transfer to Nurse Triage: Coughing- mucous. SOB. Would like to get a xray done. Cant breathe. Reason for Disposition  Patient sounds very sick or weak to the triager  Answer Assessment - Initial Assessment Questions 1. RESPIRATORY STATUS: Describe your breathing? (e.g., wheezing, shortness of breath, unable to speak, severe coughing)      SOB - severe cough 2. ONSET: When did this breathing problem begin?      Triaged on Friday as well advised ED at that time 3. PATTERN Does the difficult breathing come and go, or has it been constant since it started?      constant 4. SEVERITY: How bad is your breathing? (e.g., mild, moderate, severe)      Moderate - severe 5. RECURRENT SYMPTOM: Have you had difficulty breathing before? If Yes, ask: When was the last time? and What happened that time?      yes 6. CARDIAC HISTORY: Do you have any history of heart disease? (e.g., heart attack, angina, bypass surgery, angioplasty)      Did not ask 7. LUNG HISTORY: Do you have any history of lung disease?  (e.g., pulmonary embolus, asthma, emphysema)     COPD, emphysema, And sleep apnea 8. CAUSE: What do you think is causing the breathing problem?      pneumonia 9. OTHER  SYMPTOMS: Do you have any other symptoms? (e.g., chest pain, cough, dizziness, fever, runny nose)     Cough, dizziness 10. O2 SATURATION MONITOR:  Do you use an oxygen saturation monitor (pulse oximeter) at home? If Yes, ask: What is your reading (oxygen level) today? What is your usual oxygen saturation reading? (e.g., 95%)       no  Protocols used: Breathing Difficulty-A-AH

## 2024-01-16 NOTE — Telephone Encounter (Signed)
 Just an updated FYI-  Not ideal but it was the best I could get him to agree to  Patient returned my call and states he is not having chest pain, he had that prior to the PE but not now.  He does not think it is cardiac, he said he is just coughing up a lot of thick phlegm, having trouble breathing because of it and is extremely weak.  He thinks it is more bronchial or pneumonia. He has refused to go to the ER.  Says he will die from everything that is over there.  He did however agree that if he needed more than we can do for him here he willo then go to the ER.

## 2024-01-17 ENCOUNTER — Ambulatory Visit: Payer: Self-pay

## 2024-01-17 ENCOUNTER — Ambulatory Visit (INDEPENDENT_AMBULATORY_CARE_PROVIDER_SITE_OTHER)

## 2024-01-17 VITALS — BP 120/74 | Ht 68.0 in | Wt 191.3 lb

## 2024-01-17 DIAGNOSIS — Z0000 Encounter for general adult medical examination without abnormal findings: Secondary | ICD-10-CM
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
## 2024-01-17 NOTE — Telephone Encounter (Signed)
 FYI Only or Action Required?: FYI only for provider: ED advised.  Patient was last seen in primary care on 12/27/2023 by Ostwalt, Janna, PA-C.  Called Nurse Triage reporting URI.  Symptoms began a week ago.  Interventions attempted: Nothing.  Symptoms are: gradually worsening.  Triage Disposition: Go to ED Now (or PCP Triage)  Patient/caregiver understands and will follow disposition?: Yes  Copied from CRM [id number]. Topic: Clinical - Red Word Triage >> Jan 17, 2024 11:19 AM Caleb [name] wrote: [facility] that prompted transfer to Nurse Triage: coughing so hard almost blacked out, weak, hoarseness, cannot eat/ loss four pounds- feels very ill  and SOB Reason for Disposition  Patient sounds very sick or weak to the triager  Answer Assessment - Initial Assessment Questions 1. ONSET: When did the cough begin?      1 week  2. SEVERITY: How bad is the cough today?      So severe I felt like blacking out when I cough  3. SPUTUM: Describe the color of your sputum (e.g., none, dry cough; clear, white, yellow, green)     Brownish-green   4. HEMOPTYSIS: Are you coughing up any blood? If Yes, ask: How much? (e.g., flecks, streaks, tablespoons, etc.)     Denies  5. DIFFICULTY BREATHING: Are you having difficulty breathing? If Yes, ask: How bad is it? (e.g., mild, moderate, severe)      Pt states labored real bad, I have to take a deep breath just to talk. Pt noted to sound hoarse, pt able to speak in full sentences.   6. FEVER: Do you have a fever? If Yes, ask: What is your temperature, how was it measured, and when did it start?     Denies  7. CARDIAC HISTORY: Do you have any history of heart disease? (e.g., heart attack, congestive heart failure)      CAD, HTN  8. LUNG HISTORY: Do you have any history of lung disease?  (e.g., pulmonary embolus, asthma, emphysema)     OSA    10. OTHER SYMPTOMS: Do you have any other symptoms? (e.g., runny nose, wheezing,  chest pain)       Denies  Protocols used: Cough - Acute Productive-A-AH

## 2024-01-17 NOTE — Patient Instructions (Addendum)
 [name],  Thank you for taking the time for your Medicare Wellness Visit. I appreciate your continued commitment to your health goals. Please review the care plan we discussed, and feel free to reach out if I can assist you further.  Please note that Annual Wellness Visits do not include a physical exam. Some assessments may be limited, especially if the visit was conducted virtually. If needed, we may recommend an in-person follow-up with your provider.  Ongoing Care Seeing your primary care provider every 3 to 6 months helps us  monitor your health and provide consistent, personalized care. 04/23/24 @ 9:40 AM APPT FOR PHYSICAL W/ DR.[email or web address]  Referrals If a referral was made during today's visit and you haven't received any updates within two weeks, please contact the referred provider directly to check on the status.  Recommended Screenings:  Health Maintenance  Topic Date Due   COVID-19 Vaccine (1) Never done   DTaP/Tdap/Td vaccine (1 - Tdap) Never done   Flu Shot  04/03/2024*   Screening for Lung Cancer  12/15/2024   Medicare Annual Wellness Visit  01/16/2025   Colon Cancer Screening  05/03/2027   Pneumococcal Vaccine for age over 26  Completed   Hepatitis C Screening  Completed   Zoster (Shingles) Vaccine  Completed   Meningitis B Vaccine  Aged Out  *Topic was postponed. The date shown is not the original due date.     Vision: Annual vision screenings are recommended for early detection of glaucoma, cataracts, and diabetic retinopathy. These exams can also reveal signs of chronic conditions such as diabetes and high blood pressure.  Dental: Annual dental screenings help detect early signs of oral cancer, gum disease, and other conditions linked to overall health, including heart disease and diabetes.  Please see the attached documents for additional preventive care recommendations.   NEXT AWV 01/17/24 @ 3:50 PM IN PERSON

## 2024-01-19 ENCOUNTER — Ambulatory Visit: Admitting: General Surgery

## 2024-01-19 NOTE — Telephone Encounter (Signed)
 Agree with ED given the significant SOB that he is complaining of.  Has a history of cough syncope. If he absolutely will not take ED recommendation, ok to get him an acute appt with anyone that has availability as I'd rather that he be seen than not.

## 2024-01-20 ENCOUNTER — Ambulatory Visit: Admitting: Family Medicine

## 2024-01-20 ENCOUNTER — Encounter: Payer: Self-pay | Admitting: Family Medicine

## 2024-01-20 VITALS — BP 110/57 | HR 86 | Temp 97.9°F | Ht 68.0 in | Wt 195.5 lb

## 2024-01-20 DIAGNOSIS — J449 Chronic obstructive pulmonary disease, unspecified: Secondary | ICD-10-CM

## 2024-01-20 DIAGNOSIS — E7800 Pure hypercholesterolemia, unspecified: Secondary | ICD-10-CM

## 2024-01-20 DIAGNOSIS — I10 Essential (primary) hypertension: Secondary | ICD-10-CM

## 2024-01-20 DIAGNOSIS — J189 Pneumonia, unspecified organism: Secondary | ICD-10-CM

## 2024-01-20 DIAGNOSIS — F1120 Opioid dependence, uncomplicated: Secondary | ICD-10-CM

## 2024-01-20 DIAGNOSIS — I2699 Other pulmonary embolism without acute cor pulmonale: Secondary | ICD-10-CM

## 2024-01-20 DIAGNOSIS — F411 Generalized anxiety disorder: Secondary | ICD-10-CM

## 2024-01-20 DIAGNOSIS — L4050 Arthropathic psoriasis, unspecified: Secondary | ICD-10-CM

## 2024-01-20 DIAGNOSIS — R7303 Prediabetes: Secondary | ICD-10-CM

## 2024-01-20 LAB — POC COVID19/FLU A&B COMBO
Covid Antigen, POC: NEGATIVE
Influenza A Antigen, POC: NEGATIVE
Influenza B Antigen, POC: NEGATIVE

## 2024-01-20 MED ORDER — AZITHROMYCIN 250 MG PO TABS: ORAL_TABLET | ORAL | 0 refills | Status: AC

## 2024-01-20 MED ORDER — AMOXICILLIN-POT CLAVULANATE 875-125 MG PO TABS: 1.0000 | ORAL_TABLET | Freq: Two times a day (BID) | ORAL | 0 refills | Status: AC

## 2024-01-20 NOTE — Progress Notes (Unsigned)
 "     Established patient visit   Patient: Caleb [name]   DOB: Jul 11, 1955   69 y.o. Male  MRN: [id number] Visit Date: 01/20/2024  Today's healthcare provider: LAURAINE LOISE BUOY, DO   Chief Complaint  Patient presents with   Acute Visit    Patient is here today sick for 2 weeks, wanted him to go to ER and they was packed so he left called and got the appointment for today.  Have been taking OTC medications, has a cough.  States that he has no other symptoms other than a cough and sinus congestion.  States that coughing and blowing phlegm up it is yellowish/green.  Reports of being very weak as well.   States when he was in the hospital that they found a blood clot on his right lung.   Subjective    HPI     coughing up a lot of thick phlegm, having trouble breathing because of it and is extremely weak. He thinks it is more bronchial or pneumonia   SOB different from SOB w/Recent PE hospitatlization *** Symptoms started 3-4 days ago and  Coughing up thick green mucous Patient denies chest pain, chills or aching. Patient states this is starting to make him have shortness of breath--he states he doesn't feel like he can breathe deeply. Patient confirms having a history of a blood clot--he states that he has one right now He is advised that having new shortness of breath with the history of having a blood clot it is recommended that he goes to the Emergency Room. Patient states he has an appointment at a pain clinic that he cannot miss so he is going to go to that appointment first. It is unclear if patient is going to go to the Emergency Room.   {History (Optional):23778}  Medications: Show/hide medication list[1]  Review of Systems  Constitutional:  Negative for chills and fever.  HENT:  Positive for congestion, postnasal drip and rhinorrhea. Negative for ear pain, sinus pressure, sinus pain, sore throat and trouble swallowing.   Respiratory:  Positive for cough (productive)  and shortness of breath.   Cardiovascular:  Positive for chest pain.  Gastrointestinal:  Negative for abdominal pain, diarrhea, nausea and vomiting.    {Insert previous labs (optional):23779} {See past labs  Heme  Chem  Endocrine  Serology  Results Review (optional):1}   Objective    BP (!) 110/57 (BP Location: Left Arm, Patient Position: Sitting, Cuff Size: Large)   Pulse 86   Temp 97.9 F (36.6 C) (Oral)   Ht 5' 8 (1.727 m)   Wt 195 lb 8 oz (88.7 kg)   SpO2 97%   BMI 29.73 kg/m  {Insert last BP/Wt (optional):23777}{See vitals history (optional):1}   Physical Exam Vitals reviewed.  Constitutional:      General: He is not in acute distress.    Appearance: Normal appearance. He is not diaphoretic.  HENT:     Head: Normocephalic and atraumatic.  Eyes:     General: No scleral icterus.    Conjunctiva/sclera: Conjunctivae normal.  Cardiovascular:     Rate and Rhythm: Normal rate and regular rhythm.     Pulses: Normal pulses.     Heart sounds: Normal heart sounds. No murmur heard. Pulmonary:     Effort: Pulmonary effort is normal. No respiratory distress.     Breath sounds: Wheezing (mild) and rales (mild, at lung bases) present.  Musculoskeletal:     Cervical back: Neck supple.  Right lower leg: No edema.     Left lower leg: No edema.  Lymphadenopathy:     Cervical: No cervical adenopathy.  Skin:    General: Skin is warm and dry.     Findings: No rash.  Neurological:     Mental Status: He is alert and oriented to person, place, and time. Mental status is at baseline.  Psychiatric:        Mood and Affect: Mood normal.        Behavior: Behavior normal.      No results found for any visits on 01/20/24.  Assessment & Plan    Community acquired pneumonia, unspecified laterality -     POC Covid19/Flu A&B Antigen -     Azithromycin ; Take 2 tablets on day 1, then 1 tablet daily on days 2 through 5  Dispense: 6 tablet; Refill: 0 -     Amoxicillin -Pot  Clavulanate; Take 1 tablet by mouth 2 (two) times daily.  Dispense: 20 tablet; Refill: 0    ***  Return if symptoms worsen or fail to improve.      I discussed the assessment and treatment plan with the patient  The patient was provided an opportunity to ask questions and all were answered. The patient agreed with the plan and demonstrated an understanding of the instructions.   The patient was advised to call back or seek an in-person evaluation if the symptoms worsen or if the condition fails to improve as anticipated.    LAURAINE LOISE BUOY, DO  [facility] [phone] (phone) [phone] (fax)  [facility]     [1]  Outpatient Medications Prior to Visit  Medication Sig   albuterol  (VENTOLIN  HFA) 108 (90 Base) MCG/ACT inhaler INHALE 2 PUFFS BY MOUTH EVERY 6 HOURS AS NEEDED FOR SHORTNESS OF BREATH AND WHEEZING   amLODipine  (NORVASC ) 2.5 MG tablet TAKE 1 TABLET BY MOUTH ONCE DAILY   apixaban  (ELIQUIS ) 5 MG TABS tablet Take 1 tablet (5 mg total) by mouth 2 (two) times daily.   aspirin  EC 81 MG tablet Take 81 mg by mouth daily.    buPROPion  (WELLBUTRIN  XL) 150 MG 24 hr tablet TAKE 1 TABLET(150 MG) BY MOUTH DAILY   cetirizine  (ZYRTEC ) 10 MG tablet TAKE 1 TABLET BY MOUTH ONCE DAILY   COSENTYX  SENSOREADY, 300 MG, 150 MG/ML SOAJ INJECT 300 MG (2 PENS) SUBCUTANEOUSLY EVERY 4 WEEKS FOR MAINTENANCE   fluticasone  (FLONASE ) 50 MCG/ACT nasal spray INSTILL TWO (2) SPRAYS IN EACH NOSTRIL ONCE DAILY   hydrochlorothiazide  (HYDRODIURIL ) 25 MG tablet TAKE 1 TABLET BY MOUTH DAILY   HYDROcodone -acetaminophen  (NORCO) 7.5-325 MG tablet Take 1 tablet by mouth every 6 (six) hours.   isosorbide  mononitrate (IMDUR ) 30 MG 24 hr tablet TAKE 1 TABLET BY MOUTH DAILY   lidocaine  (XYLOCAINE ) 5 % ointment Apply 1 Application topically as needed.   losartan  (COZAAR ) 50 MG tablet TAKE 1 TABLET BY MOUTH ONCE DAILY   Magnesium Hydroxide (MAGNESIA PO) Take 1 tablet by mouth 3  (three) times daily.   Melatonin 10 MG CAPS Take 20 mg by mouth at bedtime.   metFORMIN (GLUCOPHAGE) 850 MG tablet Take 850 mg by mouth daily.   methocarbamol  (ROBAXIN ) 750 MG tablet Take 1 tablet (750 mg total) by mouth 3 (three) times daily. (Patient taking differently: Take 750 mg by mouth 3 (three) times daily. TAKES PRN)   methotrexate (RHEUMATREX) 2.5 MG tablet Take 15 mg by mouth once a week.   montelukast  (SINGULAIR ) 10  MG tablet Take 10 mg by mouth at bedtime.   naloxone  (NARCAN ) nasal spray 4 mg/0.1 mL Use in case of opioid overdose   nitroGLYCERIN  (NITROSTAT ) 0.4 MG SL tablet Place 1 tablet (0.4 mg total) under the tongue every 5 (five) minutes as needed for chest pain.   nortriptyline  (PAMELOR ) 25 MG capsule TAKE 1 CAPSULE BY MOUTH ONCE DAILY   pregabalin  (LYRICA ) 200 MG capsule Take 1 capsule (200 mg total) by mouth daily.   psyllium (REGULOID) 0.52 g capsule Take 0.52 g by mouth in the morning and at bedtime.   rosuvastatin  (CRESTOR ) 40 MG tablet TAKE 1 TABLET BY MOUTH ONCE DAILY   triamcinolone  ointment (KENALOG ) 0.5 % Apply topically.   TURMERIC PO Take 1 capsule by mouth daily.   umeclidinium-vilanterol (ANORO ELLIPTA ) 62.5-25 MCG/ACT AEPB Inhale 1 puff into the lungs daily.   No facility-administered medications prior to visit.   "

## 2024-02-03 ENCOUNTER — Ambulatory Visit: Admitting: Adult Health

## 2024-02-10 ENCOUNTER — Telehealth: Payer: Self-pay

## 2024-02-10 NOTE — Telephone Encounter (Signed)
 Copied from CRM #[id number]. Topic: Clinical - Prescription Issue >> Feb 10, 2024  2:04 PM Delon T wrote: Reason for CRM: apixaban  (ELIQUIS ) 5 MG TABS tablet- have an insurance issue right now and wont have the [facility] back in March,  right now the medication is too expensive and is asking for a cheaper alternative until he gets the [facility] back- only has enough left until Monday- [phone]

## 2024-02-10 NOTE — Telephone Encounter (Signed)
 Does he still have Medicaid or is he uninsured? Most alternatives will be expensive if he is uninsured.

## 2024-03-05 ENCOUNTER — Ambulatory Visit

## 2024-04-23 ENCOUNTER — Encounter: Admitting: Family Medicine

## 2024-06-27 ENCOUNTER — Ambulatory Visit: Admitting: Nurse Practitioner

## 2025-01-16 ENCOUNTER — Ambulatory Visit
